# Patient Record
Sex: Female | Born: 1960 | Race: Black or African American | Hispanic: No | Smoking: Never smoker
Health system: Southern US, Community
[De-identification: ages and names within clinical notes are randomized; demographics above are authoritative.]

## PROBLEM LIST (undated history)

## (undated) DIAGNOSIS — M1990 Unspecified osteoarthritis, unspecified site: Secondary | ICD-10-CM

## (undated) DIAGNOSIS — K219 Gastro-esophageal reflux disease without esophagitis: Secondary | ICD-10-CM

## (undated) DIAGNOSIS — M1711 Unilateral primary osteoarthritis, right knee: Secondary | ICD-10-CM

## (undated) DIAGNOSIS — M2550 Pain in unspecified joint: Secondary | ICD-10-CM

## (undated) DIAGNOSIS — R600 Localized edema: Secondary | ICD-10-CM

## (undated) DIAGNOSIS — E039 Hypothyroidism, unspecified: Secondary | ICD-10-CM

## (undated) DIAGNOSIS — G8929 Other chronic pain: Secondary | ICD-10-CM

## (undated) DIAGNOSIS — D649 Anemia, unspecified: Secondary | ICD-10-CM

## (undated) DIAGNOSIS — R51 Headache: Secondary | ICD-10-CM

## (undated) DIAGNOSIS — M549 Dorsalgia, unspecified: Secondary | ICD-10-CM

## (undated) DIAGNOSIS — M2540 Effusion, unspecified joint: Secondary | ICD-10-CM

## (undated) DIAGNOSIS — R351 Nocturia: Secondary | ICD-10-CM

## (undated) DIAGNOSIS — R7303 Prediabetes: Secondary | ICD-10-CM

## (undated) DIAGNOSIS — R55 Syncope and collapse: Secondary | ICD-10-CM

## (undated) DIAGNOSIS — R609 Edema, unspecified: Secondary | ICD-10-CM

## (undated) DIAGNOSIS — F329 Major depressive disorder, single episode, unspecified: Secondary | ICD-10-CM

## (undated) DIAGNOSIS — F419 Anxiety disorder, unspecified: Secondary | ICD-10-CM

## (undated) DIAGNOSIS — T7840XA Allergy, unspecified, initial encounter: Secondary | ICD-10-CM

## (undated) DIAGNOSIS — M069 Rheumatoid arthritis, unspecified: Secondary | ICD-10-CM

## (undated) HISTORY — DX: Unspecified osteoarthritis, unspecified site: M19.90

## (undated) HISTORY — DX: Anxiety disorder, unspecified: F41.9

## (undated) HISTORY — PX: EYE SURGERY: SHX253

## (undated) HISTORY — DX: Major depressive disorder, single episode, unspecified: F32.9

## (undated) HISTORY — PX: TONSILLECTOMY: SUR1361

## (undated) HISTORY — DX: Rheumatoid arthritis, unspecified: M06.9

## (undated) HISTORY — PX: TUBAL LIGATION: SHX77

## (undated) HISTORY — DX: Syncope and collapse: R55

## (undated) HISTORY — PX: CHOLECYSTECTOMY: SHX55

## (undated) HISTORY — DX: Allergy, unspecified, initial encounter: T78.40XA

## (undated) HISTORY — DX: Gastro-esophageal reflux disease without esophagitis: K21.9

---

## 1989-03-18 HISTORY — PX: CARPAL TUNNEL RELEASE: SHX101

## 1993-03-18 HISTORY — PX: ABDOMINAL HYSTERECTOMY: SHX81

## 1993-03-18 HISTORY — PX: KNEE ARTHROSCOPY: SUR90

## 1998-01-08 ENCOUNTER — Emergency Department (HOSPITAL_COMMUNITY): Admission: EM | Admit: 1998-01-08 | Discharge: 1998-01-08 | Payer: Self-pay | Admitting: Emergency Medicine

## 1998-01-08 ENCOUNTER — Encounter: Payer: Self-pay | Admitting: Emergency Medicine

## 1998-08-28 ENCOUNTER — Ambulatory Visit (HOSPITAL_COMMUNITY): Admission: RE | Admit: 1998-08-28 | Discharge: 1998-08-28 | Payer: Self-pay | Admitting: Obstetrics and Gynecology

## 1998-11-15 ENCOUNTER — Encounter (INDEPENDENT_AMBULATORY_CARE_PROVIDER_SITE_OTHER): Payer: Self-pay

## 1998-11-15 ENCOUNTER — Inpatient Hospital Stay (HOSPITAL_COMMUNITY): Admission: RE | Admit: 1998-11-15 | Discharge: 1998-11-17 | Payer: Self-pay | Admitting: Obstetrics and Gynecology

## 1999-03-19 DIAGNOSIS — F32A Depression, unspecified: Secondary | ICD-10-CM

## 1999-03-19 HISTORY — DX: Depression, unspecified: F32.A

## 1999-10-04 ENCOUNTER — Inpatient Hospital Stay (HOSPITAL_COMMUNITY): Admission: EM | Admit: 1999-10-04 | Discharge: 1999-10-08 | Payer: Self-pay | Admitting: *Deleted

## 2000-08-27 ENCOUNTER — Other Ambulatory Visit: Admission: RE | Admit: 2000-08-27 | Discharge: 2000-08-27 | Payer: Self-pay | Admitting: Obstetrics and Gynecology

## 2001-12-21 ENCOUNTER — Encounter: Payer: Self-pay | Admitting: Obstetrics and Gynecology

## 2001-12-21 ENCOUNTER — Ambulatory Visit (HOSPITAL_COMMUNITY): Admission: RE | Admit: 2001-12-21 | Discharge: 2001-12-21 | Payer: Self-pay | Admitting: Obstetrics and Gynecology

## 2002-03-18 HISTORY — PX: THYROIDECTOMY, PARTIAL: SHX18

## 2002-03-27 ENCOUNTER — Emergency Department (HOSPITAL_COMMUNITY): Admission: EM | Admit: 2002-03-27 | Discharge: 2002-03-28 | Payer: Self-pay | Admitting: Emergency Medicine

## 2002-08-27 ENCOUNTER — Encounter: Payer: Self-pay | Admitting: Emergency Medicine

## 2002-08-27 ENCOUNTER — Inpatient Hospital Stay (HOSPITAL_COMMUNITY): Admission: EM | Admit: 2002-08-27 | Discharge: 2002-08-28 | Payer: Self-pay | Admitting: Emergency Medicine

## 2002-08-28 ENCOUNTER — Encounter: Payer: Self-pay | Admitting: Family Medicine

## 2002-09-13 ENCOUNTER — Ambulatory Visit (HOSPITAL_COMMUNITY): Admission: RE | Admit: 2002-09-13 | Discharge: 2002-09-13 | Payer: Self-pay | Admitting: Family Medicine

## 2002-09-13 ENCOUNTER — Encounter: Payer: Self-pay | Admitting: Family Medicine

## 2002-09-27 ENCOUNTER — Encounter: Admission: RE | Admit: 2002-09-27 | Discharge: 2002-09-27 | Payer: Self-pay | Admitting: Family Medicine

## 2002-10-14 ENCOUNTER — Encounter: Payer: Self-pay | Admitting: General Surgery

## 2002-10-18 ENCOUNTER — Encounter: Admission: RE | Admit: 2002-10-18 | Discharge: 2002-10-18 | Payer: Self-pay | Admitting: Neurology

## 2002-10-18 ENCOUNTER — Encounter: Payer: Self-pay | Admitting: Neurology

## 2002-10-19 ENCOUNTER — Encounter (INDEPENDENT_AMBULATORY_CARE_PROVIDER_SITE_OTHER): Payer: Self-pay | Admitting: *Deleted

## 2002-10-19 ENCOUNTER — Ambulatory Visit (HOSPITAL_COMMUNITY): Admission: RE | Admit: 2002-10-19 | Discharge: 2002-10-20 | Payer: Self-pay | Admitting: General Surgery

## 2002-12-23 ENCOUNTER — Encounter: Payer: Self-pay | Admitting: Emergency Medicine

## 2002-12-23 ENCOUNTER — Inpatient Hospital Stay (HOSPITAL_COMMUNITY): Admission: EM | Admit: 2002-12-23 | Discharge: 2002-12-27 | Payer: Self-pay | Admitting: Emergency Medicine

## 2002-12-25 ENCOUNTER — Encounter: Payer: Self-pay | Admitting: Neurology

## 2002-12-25 ENCOUNTER — Encounter: Admission: RE | Admit: 2002-12-25 | Discharge: 2002-12-25 | Payer: Self-pay | Admitting: Neurology

## 2003-03-15 ENCOUNTER — Other Ambulatory Visit: Admission: RE | Admit: 2003-03-15 | Discharge: 2003-03-15 | Payer: Self-pay | Admitting: Obstetrics and Gynecology

## 2004-05-09 ENCOUNTER — Ambulatory Visit: Payer: Self-pay | Admitting: Family Medicine

## 2004-05-11 ENCOUNTER — Ambulatory Visit (HOSPITAL_COMMUNITY): Admission: RE | Admit: 2004-05-11 | Discharge: 2004-05-11 | Payer: Self-pay | Admitting: Family Medicine

## 2004-08-21 ENCOUNTER — Ambulatory Visit: Payer: Self-pay | Admitting: Family Medicine

## 2004-08-23 ENCOUNTER — Ambulatory Visit (HOSPITAL_COMMUNITY): Admission: RE | Admit: 2004-08-23 | Discharge: 2004-08-23 | Payer: Self-pay | Admitting: Family Medicine

## 2004-09-25 ENCOUNTER — Ambulatory Visit: Payer: Self-pay | Admitting: Family Medicine

## 2004-10-04 ENCOUNTER — Ambulatory Visit: Payer: Self-pay | Admitting: Family Medicine

## 2004-12-07 ENCOUNTER — Ambulatory Visit: Payer: Self-pay | Admitting: Family Medicine

## 2005-03-18 HISTORY — PX: CHOLECYSTECTOMY, LAPAROSCOPIC: SHX56

## 2005-03-18 HISTORY — PX: CRANIECTOMY SUBOCCIPITAL W/ CERVICAL LAMINECTOMY / CHIARI: SUR327

## 2005-04-11 ENCOUNTER — Ambulatory Visit: Payer: Self-pay | Admitting: Family Medicine

## 2005-04-23 ENCOUNTER — Ambulatory Visit: Payer: Self-pay | Admitting: Family Medicine

## 2005-05-09 ENCOUNTER — Ambulatory Visit: Payer: Self-pay | Admitting: Family Medicine

## 2005-05-10 ENCOUNTER — Ambulatory Visit (HOSPITAL_COMMUNITY): Admission: RE | Admit: 2005-05-10 | Discharge: 2005-05-10 | Payer: Self-pay | Admitting: Family Medicine

## 2005-05-13 ENCOUNTER — Ambulatory Visit: Payer: Self-pay | Admitting: Psychology

## 2005-06-07 ENCOUNTER — Ambulatory Visit (HOSPITAL_COMMUNITY): Payer: Self-pay | Admitting: Psychology

## 2005-08-28 ENCOUNTER — Ambulatory Visit: Payer: Self-pay | Admitting: Family Medicine

## 2005-10-23 ENCOUNTER — Other Ambulatory Visit: Admission: RE | Admit: 2005-10-23 | Discharge: 2005-10-23 | Payer: Self-pay | Admitting: Family Medicine

## 2005-10-23 ENCOUNTER — Ambulatory Visit: Payer: Self-pay | Admitting: Family Medicine

## 2005-10-31 ENCOUNTER — Ambulatory Visit (HOSPITAL_COMMUNITY): Admission: RE | Admit: 2005-10-31 | Discharge: 2005-10-31 | Payer: Self-pay | Admitting: Family Medicine

## 2005-10-31 ENCOUNTER — Ambulatory Visit: Payer: Self-pay | Admitting: Family Medicine

## 2005-12-26 ENCOUNTER — Emergency Department (HOSPITAL_COMMUNITY): Admission: EM | Admit: 2005-12-26 | Discharge: 2005-12-26 | Payer: Self-pay | Admitting: Emergency Medicine

## 2005-12-26 ENCOUNTER — Ambulatory Visit: Payer: Self-pay | Admitting: Family Medicine

## 2005-12-27 ENCOUNTER — Ambulatory Visit (HOSPITAL_COMMUNITY): Admission: RE | Admit: 2005-12-27 | Discharge: 2005-12-27 | Payer: Self-pay | Admitting: Family Medicine

## 2005-12-31 ENCOUNTER — Encounter (HOSPITAL_COMMUNITY): Admission: RE | Admit: 2005-12-31 | Discharge: 2006-01-30 | Payer: Self-pay | Admitting: Family Medicine

## 2006-01-10 ENCOUNTER — Encounter (INDEPENDENT_AMBULATORY_CARE_PROVIDER_SITE_OTHER): Payer: Self-pay | Admitting: *Deleted

## 2006-01-10 ENCOUNTER — Ambulatory Visit (HOSPITAL_COMMUNITY): Admission: RE | Admit: 2006-01-10 | Discharge: 2006-01-10 | Payer: Self-pay | Admitting: General Surgery

## 2006-02-24 ENCOUNTER — Ambulatory Visit: Payer: Self-pay | Admitting: Family Medicine

## 2006-06-23 ENCOUNTER — Ambulatory Visit: Payer: Self-pay | Admitting: Family Medicine

## 2006-08-25 ENCOUNTER — Ambulatory Visit: Payer: Self-pay | Admitting: Family Medicine

## 2006-08-26 ENCOUNTER — Ambulatory Visit (HOSPITAL_COMMUNITY): Admission: RE | Admit: 2006-08-26 | Discharge: 2006-08-26 | Payer: Self-pay | Admitting: Family Medicine

## 2006-09-17 ENCOUNTER — Ambulatory Visit: Payer: Self-pay | Admitting: Family Medicine

## 2006-10-06 ENCOUNTER — Ambulatory Visit: Payer: Self-pay | Admitting: Family Medicine

## 2006-10-07 ENCOUNTER — Encounter: Payer: Self-pay | Admitting: Family Medicine

## 2006-10-07 LAB — CONVERTED CEMR LAB
Basophils Absolute: 0 10*3/uL (ref 0.0–0.1)
CO2: 26 meq/L (ref 19–32)
Calcium: 8.7 mg/dL (ref 8.4–10.5)
Eosinophils Relative: 2 % (ref 0–5)
HCT: 43.4 % (ref 36.0–46.0)
HDL: 47 mg/dL (ref >39)
Lymphocytes Relative: 28 % (ref 12–46)
Neutro Abs: 4.8 10*3/uL (ref 1.7–7.7)
Neutrophils Relative %: 63 % (ref 43–77)
Platelets: 391 10*3/uL (ref 150–400)
Potassium: 4.6 meq/L (ref 3.5–5.3)
RDW: 16 % — ABNORMAL HIGH (ref 11.5–14.0)
Sodium: 138 meq/L (ref 135–145)
TSH: 4.757 microintl units/mL (ref 0.350–5.50)
Total CHOL/HDL Ratio: 3

## 2006-10-15 ENCOUNTER — Ambulatory Visit: Payer: Self-pay | Admitting: Family Medicine

## 2006-10-20 ENCOUNTER — Ambulatory Visit (HOSPITAL_COMMUNITY): Admission: RE | Admit: 2006-10-20 | Discharge: 2006-10-20 | Payer: Self-pay | Admitting: *Deleted

## 2006-11-25 ENCOUNTER — Ambulatory Visit: Payer: Self-pay | Admitting: Family Medicine

## 2006-12-04 ENCOUNTER — Ambulatory Visit (HOSPITAL_COMMUNITY): Admission: RE | Admit: 2006-12-04 | Discharge: 2006-12-04 | Payer: Self-pay | Admitting: *Deleted

## 2006-12-22 ENCOUNTER — Ambulatory Visit (HOSPITAL_COMMUNITY): Admission: RE | Admit: 2006-12-22 | Discharge: 2006-12-23 | Payer: Self-pay | Admitting: Neurosurgery

## 2007-02-23 ENCOUNTER — Ambulatory Visit: Payer: Self-pay | Admitting: Family Medicine

## 2007-03-19 ENCOUNTER — Encounter: Payer: Self-pay | Admitting: Family Medicine

## 2007-04-09 ENCOUNTER — Ambulatory Visit: Payer: Self-pay | Admitting: Family Medicine

## 2007-08-25 ENCOUNTER — Encounter: Payer: Self-pay | Admitting: Family Medicine

## 2007-09-02 ENCOUNTER — Ambulatory Visit: Payer: Self-pay | Admitting: Family Medicine

## 2007-09-15 ENCOUNTER — Ambulatory Visit (HOSPITAL_COMMUNITY): Admission: RE | Admit: 2007-09-15 | Discharge: 2007-09-15 | Payer: Self-pay | Admitting: Family Medicine

## 2007-10-07 ENCOUNTER — Ambulatory Visit: Payer: Self-pay | Admitting: Family Medicine

## 2007-11-26 ENCOUNTER — Telehealth: Payer: Self-pay | Admitting: Family Medicine

## 2007-11-27 ENCOUNTER — Encounter: Payer: Self-pay | Admitting: Family Medicine

## 2007-11-30 ENCOUNTER — Ambulatory Visit: Payer: Self-pay | Admitting: Family Medicine

## 2007-12-22 ENCOUNTER — Ambulatory Visit: Payer: Self-pay | Admitting: Family Medicine

## 2007-12-22 ENCOUNTER — Ambulatory Visit (HOSPITAL_COMMUNITY): Admission: RE | Admit: 2007-12-22 | Discharge: 2007-12-22 | Payer: Self-pay | Admitting: Family Medicine

## 2007-12-29 ENCOUNTER — Ambulatory Visit (HOSPITAL_COMMUNITY): Admission: RE | Admit: 2007-12-29 | Discharge: 2007-12-29 | Payer: Self-pay | Admitting: Family Medicine

## 2008-03-21 ENCOUNTER — Telehealth: Payer: Self-pay | Admitting: Family Medicine

## 2008-03-21 ENCOUNTER — Ambulatory Visit: Payer: Self-pay | Admitting: Family Medicine

## 2008-03-21 LAB — CONVERTED CEMR LAB
BUN: 18 mg/dL (ref 6–23)
Bilirubin Urine: NEGATIVE
Calcium: 9.2 mg/dL (ref 8.4–10.5)
Cholesterol: 156 mg/dL (ref 0–200)
Eosinophils Absolute: 0.2 10*3/uL (ref 0.0–0.7)
Eosinophils Relative: 2 % (ref 0–5)
Glucose, Bld: 89 mg/dL (ref 70–99)
Glucose, Urine, Semiquant: NEGATIVE
HCT: 42.9 % (ref 36.0–46.0)
Ketones, urine, test strip: NEGATIVE
Lymphs Abs: 3.1 10*3/uL (ref 0.7–4.0)
MCV: 79.2 fL (ref 78.0–100.0)
Monocytes Relative: 7 % (ref 3–12)
Nitrite: NEGATIVE
RBC: 5.42 M/uL — ABNORMAL HIGH (ref 3.87–5.11)
Specific Gravity, Urine: 1.02
Urobilinogen, UA: 0.2
VLDL: 10 mg/dL (ref 0–40)
WBC: 7.4 10*3/uL (ref 4.0–10.5)
pH: 5.5

## 2008-03-22 ENCOUNTER — Ambulatory Visit (HOSPITAL_COMMUNITY): Admission: RE | Admit: 2008-03-22 | Discharge: 2008-03-22 | Payer: Self-pay | Admitting: Family Medicine

## 2008-03-22 ENCOUNTER — Encounter: Payer: Self-pay | Admitting: Family Medicine

## 2008-05-17 ENCOUNTER — Inpatient Hospital Stay (HOSPITAL_COMMUNITY): Admission: RE | Admit: 2008-05-17 | Discharge: 2008-05-20 | Payer: Self-pay | Admitting: *Deleted

## 2008-05-17 ENCOUNTER — Emergency Department (HOSPITAL_COMMUNITY): Admission: EM | Admit: 2008-05-17 | Discharge: 2008-05-17 | Payer: Self-pay | Admitting: Emergency Medicine

## 2008-05-17 ENCOUNTER — Ambulatory Visit: Payer: Self-pay | Admitting: *Deleted

## 2008-05-17 ENCOUNTER — Ambulatory Visit: Payer: Self-pay | Admitting: Family Medicine

## 2008-05-23 ENCOUNTER — Encounter: Payer: Self-pay | Admitting: Family Medicine

## 2008-05-24 ENCOUNTER — Other Ambulatory Visit: Admission: RE | Admit: 2008-05-24 | Discharge: 2008-05-24 | Payer: Self-pay | Admitting: Family Medicine

## 2008-05-24 ENCOUNTER — Encounter: Payer: Self-pay | Admitting: Family Medicine

## 2008-05-24 ENCOUNTER — Ambulatory Visit: Payer: Self-pay | Admitting: Family Medicine

## 2008-06-01 ENCOUNTER — Ambulatory Visit (HOSPITAL_COMMUNITY): Payer: Self-pay | Admitting: Psychiatry

## 2008-06-10 ENCOUNTER — Ambulatory Visit (HOSPITAL_COMMUNITY): Payer: Self-pay | Admitting: Psychiatry

## 2008-07-25 ENCOUNTER — Ambulatory Visit (HOSPITAL_COMMUNITY): Payer: Self-pay | Admitting: Psychiatry

## 2008-08-01 ENCOUNTER — Ambulatory Visit: Payer: Self-pay | Admitting: Family Medicine

## 2008-08-03 ENCOUNTER — Ambulatory Visit: Payer: Self-pay | Admitting: Family Medicine

## 2008-08-03 LAB — CONVERTED CEMR LAB: Rapid Strep: NEGATIVE

## 2008-08-29 ENCOUNTER — Ambulatory Visit: Payer: Self-pay | Admitting: Cardiology

## 2008-08-30 ENCOUNTER — Encounter: Payer: Self-pay | Admitting: Family Medicine

## 2008-09-29 ENCOUNTER — Ambulatory Visit: Payer: Self-pay | Admitting: Family Medicine

## 2008-09-29 DIAGNOSIS — IMO0002 Reserved for concepts with insufficient information to code with codable children: Secondary | ICD-10-CM | POA: Insufficient documentation

## 2008-10-11 ENCOUNTER — Ambulatory Visit: Payer: Self-pay | Admitting: Family Medicine

## 2008-10-11 ENCOUNTER — Ambulatory Visit (HOSPITAL_COMMUNITY): Admission: RE | Admit: 2008-10-11 | Discharge: 2008-10-11 | Payer: Self-pay | Admitting: Family Medicine

## 2008-11-10 ENCOUNTER — Ambulatory Visit: Payer: Self-pay | Admitting: Family Medicine

## 2008-11-10 ENCOUNTER — Encounter (INDEPENDENT_AMBULATORY_CARE_PROVIDER_SITE_OTHER): Payer: Self-pay

## 2008-11-10 DIAGNOSIS — M25559 Pain in unspecified hip: Secondary | ICD-10-CM | POA: Insufficient documentation

## 2008-11-24 ENCOUNTER — Telehealth: Payer: Self-pay | Admitting: Family Medicine

## 2008-11-27 ENCOUNTER — Telehealth: Payer: Self-pay | Admitting: Family Medicine

## 2008-11-30 ENCOUNTER — Encounter: Payer: Self-pay | Admitting: Family Medicine

## 2008-12-07 ENCOUNTER — Ambulatory Visit: Payer: Self-pay | Admitting: Family Medicine

## 2009-01-05 ENCOUNTER — Ambulatory Visit: Payer: Self-pay | Admitting: Family Medicine

## 2009-01-05 DIAGNOSIS — R5383 Other fatigue: Secondary | ICD-10-CM

## 2009-01-05 DIAGNOSIS — R5381 Other malaise: Secondary | ICD-10-CM | POA: Insufficient documentation

## 2009-02-27 ENCOUNTER — Ambulatory Visit: Payer: Self-pay | Admitting: Family Medicine

## 2009-02-27 LAB — CONVERTED CEMR LAB
Glucose, Urine, Semiquant: NEGATIVE
Ketones, urine, test strip: NEGATIVE
pH: 5.5

## 2009-03-01 ENCOUNTER — Encounter: Payer: Self-pay | Admitting: Family Medicine

## 2009-03-03 LAB — CONVERTED CEMR LAB: Candida species: NEGATIVE

## 2009-03-18 HISTORY — PX: JOINT REPLACEMENT: SHX530

## 2009-05-11 ENCOUNTER — Ambulatory Visit: Payer: Self-pay | Admitting: Family Medicine

## 2009-06-01 ENCOUNTER — Encounter: Admission: RE | Admit: 2009-06-01 | Discharge: 2009-06-01 | Payer: Self-pay | Admitting: Neurosurgery

## 2009-06-09 ENCOUNTER — Emergency Department (HOSPITAL_COMMUNITY): Admission: EM | Admit: 2009-06-09 | Discharge: 2009-06-10 | Payer: Self-pay | Admitting: Occupational Medicine

## 2009-06-12 ENCOUNTER — Ambulatory Visit: Payer: Self-pay | Admitting: Family Medicine

## 2009-06-29 ENCOUNTER — Telehealth: Payer: Self-pay | Admitting: Family Medicine

## 2009-07-12 ENCOUNTER — Ambulatory Visit: Payer: Self-pay | Admitting: Family Medicine

## 2009-07-16 HISTORY — PX: TOTAL KNEE ARTHROPLASTY: SHX125

## 2009-07-25 ENCOUNTER — Encounter: Payer: Self-pay | Admitting: Physician Assistant

## 2009-07-25 ENCOUNTER — Ambulatory Visit: Payer: Self-pay | Admitting: Family Medicine

## 2009-07-27 ENCOUNTER — Encounter: Payer: Self-pay | Admitting: Family Medicine

## 2009-08-02 ENCOUNTER — Encounter: Payer: Self-pay | Admitting: Family Medicine

## 2009-08-03 ENCOUNTER — Ambulatory Visit: Payer: Self-pay | Admitting: Family Medicine

## 2009-08-04 ENCOUNTER — Encounter: Payer: Self-pay | Admitting: Family Medicine

## 2009-08-08 ENCOUNTER — Inpatient Hospital Stay (HOSPITAL_COMMUNITY): Admission: RE | Admit: 2009-08-08 | Discharge: 2009-08-11 | Payer: Self-pay | Admitting: Orthopedic Surgery

## 2009-08-23 ENCOUNTER — Encounter: Payer: Self-pay | Admitting: Family Medicine

## 2009-09-20 ENCOUNTER — Encounter: Payer: Self-pay | Admitting: Family Medicine

## 2009-09-27 ENCOUNTER — Encounter: Admission: RE | Admit: 2009-09-27 | Discharge: 2009-12-14 | Payer: Self-pay | Admitting: Orthopedic Surgery

## 2009-09-28 ENCOUNTER — Encounter: Payer: Self-pay | Admitting: Family Medicine

## 2009-10-09 ENCOUNTER — Ambulatory Visit: Payer: Self-pay | Admitting: Family Medicine

## 2009-10-11 ENCOUNTER — Encounter: Payer: Self-pay | Admitting: Family Medicine

## 2009-10-11 LAB — CONVERTED CEMR LAB: Chlamydia, DNA Probe: NEGATIVE

## 2009-10-18 ENCOUNTER — Encounter: Payer: Self-pay | Admitting: Family Medicine

## 2009-10-24 ENCOUNTER — Ambulatory Visit (HOSPITAL_COMMUNITY): Admission: RE | Admit: 2009-10-24 | Discharge: 2009-10-24 | Payer: Self-pay | Admitting: Family Medicine

## 2009-12-06 ENCOUNTER — Encounter: Payer: Self-pay | Admitting: Family Medicine

## 2009-12-26 ENCOUNTER — Ambulatory Visit (HOSPITAL_COMMUNITY): Payer: Self-pay | Admitting: Psychiatry

## 2010-01-03 ENCOUNTER — Ambulatory Visit (HOSPITAL_COMMUNITY): Payer: Self-pay | Admitting: Psychiatry

## 2010-01-09 ENCOUNTER — Ambulatory Visit (HOSPITAL_COMMUNITY): Payer: Self-pay | Admitting: Psychiatry

## 2010-01-09 ENCOUNTER — Ambulatory Visit: Payer: Self-pay | Admitting: Family Medicine

## 2010-01-10 LAB — CONVERTED CEMR LAB
Hgb A1c MFr Bld: 6.3 % — ABNORMAL HIGH (ref <5.7)
TSH: 4.476 microintl units/mL (ref 0.350–4.500)

## 2010-01-12 ENCOUNTER — Ambulatory Visit (HOSPITAL_COMMUNITY): Payer: Self-pay | Admitting: Psychiatry

## 2010-01-16 ENCOUNTER — Ambulatory Visit (HOSPITAL_COMMUNITY): Payer: Self-pay | Admitting: Psychiatry

## 2010-01-17 ENCOUNTER — Encounter: Payer: Self-pay | Admitting: Family Medicine

## 2010-01-26 ENCOUNTER — Ambulatory Visit (HOSPITAL_COMMUNITY): Payer: Self-pay | Admitting: Psychiatry

## 2010-01-28 ENCOUNTER — Encounter: Payer: Self-pay | Admitting: Family Medicine

## 2010-02-05 ENCOUNTER — Ambulatory Visit (HOSPITAL_COMMUNITY): Payer: Self-pay | Admitting: Psychiatry

## 2010-02-13 ENCOUNTER — Ambulatory Visit (HOSPITAL_COMMUNITY): Payer: Self-pay | Admitting: Psychiatry

## 2010-02-22 ENCOUNTER — Ambulatory Visit (HOSPITAL_COMMUNITY): Payer: Self-pay | Admitting: Psychiatry

## 2010-02-23 ENCOUNTER — Ambulatory Visit (HOSPITAL_COMMUNITY): Payer: Self-pay | Admitting: Psychiatry

## 2010-02-23 ENCOUNTER — Encounter: Payer: Self-pay | Admitting: Family Medicine

## 2010-02-28 ENCOUNTER — Encounter: Payer: Self-pay | Admitting: Family Medicine

## 2010-03-02 ENCOUNTER — Ambulatory Visit (HOSPITAL_COMMUNITY): Payer: Self-pay | Admitting: Psychiatry

## 2010-03-02 ENCOUNTER — Encounter: Payer: Self-pay | Admitting: Family Medicine

## 2010-03-08 ENCOUNTER — Ambulatory Visit (HOSPITAL_COMMUNITY): Payer: Self-pay | Admitting: Psychiatry

## 2010-03-21 ENCOUNTER — Ambulatory Visit (HOSPITAL_COMMUNITY)
Admission: RE | Admit: 2010-03-21 | Discharge: 2010-03-21 | Payer: Self-pay | Source: Home / Self Care | Attending: Psychiatry | Admitting: Psychiatry

## 2010-03-29 ENCOUNTER — Encounter: Payer: Self-pay | Admitting: Family Medicine

## 2010-04-04 ENCOUNTER — Ambulatory Visit (HOSPITAL_COMMUNITY)
Admission: RE | Admit: 2010-04-04 | Discharge: 2010-04-04 | Payer: Self-pay | Source: Home / Self Care | Attending: Psychiatry | Admitting: Psychiatry

## 2010-04-08 ENCOUNTER — Encounter: Payer: Self-pay | Admitting: Family Medicine

## 2010-04-11 ENCOUNTER — Ambulatory Visit (HOSPITAL_COMMUNITY)
Admission: RE | Admit: 2010-04-11 | Discharge: 2010-04-11 | Payer: Self-pay | Source: Home / Self Care | Attending: Psychiatry | Admitting: Psychiatry

## 2010-04-17 NOTE — Letter (Signed)
Summary: labs  labs   Imported By: Curtis Sites 10/05/2009 10:34:47  _____________________________________________________________________  External Attachment:    Type:   Image     Comment:   External Document

## 2010-04-17 NOTE — Letter (Signed)
Summary: history and physical  history and physical   Imported By: Curtis Sites 10/05/2009 10:34:28  _____________________________________________________________________  External Attachment:    Type:   Image     Comment:   External Document

## 2010-04-17 NOTE — Letter (Signed)
Summary: progress notes  progress notes   Imported By: Curtis Sites 10/05/2009 10:35:45  _____________________________________________________________________  External Attachment:    Type:   Image     Comment:   External Document

## 2010-04-17 NOTE — Letter (Signed)
Summary: phone note  phone note   Imported By: Curtis Sites 10/05/2009 10:35:27  _____________________________________________________________________  External Attachment:    Type:   Image     Comment:   External Document

## 2010-04-17 NOTE — Letter (Signed)
Summary: xray  xray   Imported By: Curtis Sites 10/05/2009 10:36:03  _____________________________________________________________________  External Attachment:    Type:   Image     Comment:   External Document

## 2010-04-17 NOTE — Letter (Signed)
Summary: demographic  demographic   Imported By: Curtis Sites 10/05/2009 10:34:06  _____________________________________________________________________  External Attachment:    Type:   Image     Comment:   External Document

## 2010-04-17 NOTE — Letter (Signed)
Summary: consult  consult   Imported By: Curtis Sites 10/05/2009 10:33:44  _____________________________________________________________________  External Attachment:    Type:   Image     Comment:   External Document

## 2010-04-17 NOTE — Assessment & Plan Note (Signed)
Summary: [employer] papers   History of Present Illness: FMLA Forms completed.

## 2010-04-17 NOTE — Letter (Signed)
Summary: misc  misc   Imported By: Curtis Sites 10/05/2009 10:35:06  _____________________________________________________________________  External Attachment:    Type:   Image     Comment:   External Document

## 2010-04-18 ENCOUNTER — Encounter: Payer: Self-pay | Admitting: Family Medicine

## 2010-04-18 ENCOUNTER — Ambulatory Visit: Payer: Medicaid Other | Admitting: Family Medicine

## 2010-04-18 ENCOUNTER — Ambulatory Visit: Admit: 2010-04-18 | Payer: Self-pay | Admitting: Family Medicine

## 2010-04-18 ENCOUNTER — Encounter (HOSPITAL_COMMUNITY): Payer: Medicaid Other | Admitting: Psychiatry

## 2010-04-18 DIAGNOSIS — F329 Major depressive disorder, single episode, unspecified: Secondary | ICD-10-CM

## 2010-04-18 DIAGNOSIS — M25569 Pain in unspecified knee: Secondary | ICD-10-CM

## 2010-04-19 ENCOUNTER — Encounter: Payer: Self-pay | Admitting: Family Medicine

## 2010-04-19 LAB — CONVERTED CEMR LAB
Albumin: 4 g/dL (ref 3.5–5.2)
Basophils Absolute: 0 10*3/uL (ref 0.0–0.1)
Bilirubin, Direct: 0.1 mg/dL (ref 0.0–0.3)
CO2: 28 meq/L (ref 19–32)
Chloride: 102 meq/L (ref 96–112)
Eosinophils Relative: 3 % (ref 0–5)
Glucose, Bld: 89 mg/dL (ref 70–99)
HCT: 40.5 % (ref 36.0–46.0)
HDL: 51 mg/dL (ref >39)
Hgb A1c MFr Bld: 6.2 % — ABNORMAL HIGH (ref <5.7)
LDL Cholesterol: 76 mg/dL (ref 0–99)
Lymphocytes Relative: 44 % (ref 12–46)
Lymphs Abs: 2.8 10*3/uL (ref 0.7–4.0)
Neutro Abs: 3 10*3/uL (ref 1.7–7.7)
Neutrophils Relative %: 47 % (ref 43–77)
Platelets: 398 10*3/uL (ref 150–400)
Potassium: 4.3 meq/L (ref 3.5–5.3)
Sodium: 138 meq/L (ref 135–145)
Total Bilirubin: 0.5 mg/dL (ref 0.3–1.2)
Total CHOL/HDL Ratio: 2.7
VLDL: 9 mg/dL (ref 0–40)
Vit D, 25-Hydroxy: 16 ng/mL — ABNORMAL LOW (ref 30–89)
WBC: 6.4 10*3/uL (ref 4.0–10.5)

## 2010-04-19 NOTE — Letter (Signed)
Summary: [name]/WAINER  [name]/WAINER   Imported By: Lind Guest 03/01/2010 14:44:11  _____________________________________________________________________  External Attachment:    Type:   Image     Comment:   External Document

## 2010-04-19 NOTE — Assessment & Plan Note (Signed)
Summary: office visit   Vital Signs:  Patient profile:   50 year old female Menstrual status:  hysterectomy Height:      62 inches Weight:      355.75 pounds BMI:     65.30 O2 Sat:      97 % on Room air Pulse rate:   69 / minute Pulse rhythm:   regular Resp:     16 per minute BP sitting:   124 / 84  (right arm)  Vitals Entered By: Mauricia Area CMA (January 09, 2010 1:45 PM)  Nutrition Counseling: Patient's BMI is greater than 25 and therefore counseled on weight management options.  O2 Flow:  Room air CC: Follow up   Primary Care Provider:  [name] MD  CC:  Follow up.  History of Present Illness: pt in today stating that sh is not doing well overall. She has had some success from her recent knwe surgery, however, she i still unable to stand for prolonged periods, hence not working. This is financially and mentally challenging, and she is grateful for the support of her family, without which she doubts she would manage. she has no other specific site sensitive complaints.   Current Medications (verified): 1)  Ibuprofen 800 Mg Tabs (Ibuprofen) .... Take 1 Tablet By Mouth Two Times A Day , Pls Fill After  March 25,2011 2)  Phentermine Hcl 37.5 Mg Tabs (Phentermine Hcl) .... Take 1 Tablet By Mouth Once A Day  Allergies (verified): No Known Drug Allergies  Past History:  Past surgical history reviewed for relevance to current acute and chronic problems.  Past Surgical History: Reviewed history from 10/09/2009 and no changes required. 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 Eyes:   Denies blurring, discharge, eye pain, and red eye. ENT:  Denies hoarseness, nasal congestion, and sinus pressure. CV:  Denies chest pain or discomfort, palpitations, and swelling of feet. Resp:  Denies cough and sputum productive. GI:  Denies abdominal pain, constipation, diarrhea, nausea, and vomiting. GU:  Denies dysuria and urinary frequency. MS:  Complains of joint pain, low back pain, mid back pain, and stiffness; improved post surgery, on knee, however unable to stand for prolonged periods , hence uinable to work. Derm:  Denies itching and rash. Neuro:  Complains of headaches; much improved since surgery for arnold chiari. Psych:  Complains of anxiety and depression; denies suicidal thoughts/plans, thoughts of violence, and unusual visions or sounds; CIRCUMSTANCES CAUSE HER TO BE SYMPTOMATIC. Endo:  Denies cold intolerance, excessive thirst, excessive urination, and heat intolerance. Heme:  Denies abnormal bruising and bleeding. Allergy:  Denies hives or rash and itching eyes.  Physical Exam  General:  Well-developed,morbbidly obese,in no acute distress; alert,appropriate and cooperative throughout examination HEENT: No facial asymmetry,  EOMI, No sinus tenderness, TM's Clear, oropharynx  pink and moist.   Chest: Clear to auscultation bilaterally.  CVS: S1, S2, No murmurs, No S3.   Abd: Soft, Nontender.  MS: decreased  ROM spine,  hips and and knees.  Ext: No edema.   CNS: CN 2-12 intact, power tone and sensation normal throughout.  Skin: Intact, no visible lesions or rashes.  Psych: Good eye contact, normal affect.  Memory intact, fishy smelling vaginal d/c    Impression & Recommendations:  Problem # 1:  DEPRESSION (ICD-311) Assessment Unchanged  Discussed treatment options, including trial of antidpressant medication. Will refer to behavioral health. Follow-up call in in 24-48 hours and recheck in 2 weeks, sooner as needed. Patient agrees to call if any worsening of  symptoms or thoughts of doing harm arise. Verified that the patient has no suicidal ideation at this time.  pt continues in therapy  Problem # 2:  MORBID OBESITY (ICD-278.01) Assessment: Unchanged  Orders: T- Hemoglobin A1C (04540-98119)  Ht: 62 (01/09/2010)   Wt: 355.75 (01/09/2010)   BMI: 65.30 (01/09/2010) therapeutic lifestyle change discussed and encouraged  Problem # 3:  KNEE PAIN, BILATERAL (ICD-719.46) Assessment: Unchanged  Her updated medication list for this problem includes:    Ibuprofen 800 Mg Tabs (Ibuprofen) .Marland Kitchen... Take 1 tablet by mouth two times a day , pls fill after  march 25,2011  Complete Medication List: 1)  Ibuprofen 800 Mg Tabs (Ibuprofen) .... Take 1 tablet by mouth two times a day , pls fill after  march 25,2011 2)  Phentermine Hcl 37.5 Mg Caps (Phentermine hcl) .... Take 1 tablet by mouth once a day  Other Orders: T-TSH ([id number])  Patient Instructions: 1)  CPE nad pap in 2 months. 2)  It is important that you exercise regularly at least 20 minutes 5 times a week. If you develop chest pain, have severe difficulty breathing, or feel very tired , stop exercising immediately and seek medical attention. 3)  You need to lose weight. Consider a lower calorie diet and regular exercise.  4)  TSH prior to visit, ICD-9: 5)  HbgA1C prior to visit, ICD-9:   today 6)  I really hope that you start feeling better soon  Prescriptions: PHENTERMINE HCL 37.5 MG CAPS (PHENTERMINE HCL) Take 1 tablet by mouth once a day  #30 x 1   Entered and Authorized by:   [name] MD   Signed by:   [name] MD on 01/09/2010   Method used:   Printed then faxed to ...       [facility] [phone]* (retail)       [address]       [place], [place]  [address]       Ph: [phone] or [phone]       Fax: [phone]   RxID:   [id number]    Orders Added: 1)  Est. Patient Level III [99213] 2)  T- Hemoglobin A1C  [83036-23375] 3)  T-TSH [18841-66063]

## 2010-04-19 NOTE — Letter (Signed)
Summary: mediocal release  mediocal release   Imported By: Lind Guest 03/02/2010 14:15:14  _____________________________________________________________________  External Attachment:    Type:   Image     Comment:   External Document

## 2010-04-19 NOTE — Assessment & Plan Note (Signed)
Summary: office visit   Vital Signs:  Patient profile:   50 year old female Menstrual status:  hysterectomy Height:      63 inches Weight:      329.75 pounds BMI:     58.62 O2 Sat:      99 % Pulse rate:   74 / minute Pulse rhythm:   regular Resp:     16 per minute BP sitting:   130 / 84  (left arm) Cuff size:   xl  Vitals Entered By: Everitt Amber LPN (May 11, 2009 10:58 AM)  Nutrition Counseling: Patient's BMI is greater than 25 and therefore counseled on weight management options. CC: Follow up chronic problems   Primary Care Provider:  [name] MD  CC:  Follow up chronic problems.  History of Present Illness: Sinus pressure and congestion, yellow drainage x 4 days, no feevr , chills or body aches, no cough or sputum.  Wants to resume the phentermine in the meantime as she waits on surgery.  Wants ibuprofen for her bilateral knee pain which has been worse in the past week, and she requests an injection fior this also  Left upper ext pain and numbness, trying to contact neurosurgery.This started to flare up in thepast 2 weeks   Current Medications (verified): 1)  Phentermine Hcl 37.5 Mg Tabs (Phentermine Hcl) .... Take 1 Tablet By Mouth Once A Day  Allergies (verified): No Known Drug Allergies  Review of Systems      See HPI Eyes:  Denies blurring and discharge. GI:  Denies abdominal pain, constipation, diarrhea, nausea, and vomiting blood. GU:  Denies dysuria and urinary frequency. Neuro:  Denies headaches, seizures, and sensation of room spinning. Psych:  Denies anxiety and depression. Endo:  Denies cold intolerance, excessive hunger, excessive thirst, excessive urination, heat intolerance, polyuria, and weight change. Heme:  Denies abnormal bruising and bleeding. Allergy:  Complains of seasonal allergies.  Physical Exam  General:  Well-developed,obese,in no acute distress; alert,appropriate and cooperative throughout examination HEENT: No facial  asymmetry,  EOMI,positive  sinus tenderness, TM's Clear, oropharynx  pink and moist.   Chest: Clear to auscultation bilaterally.  CVS: S1, S2, No murmurs, No S3.   Abd: Soft, Nontender.  MS: decreased  ROM spine, hips, shoulders and reduced in knees.  Ext: No edema.   CNS: CN 2-12 intact, power tone and sensation normal throughout.   Skin: Intact, no visible lesions or rashes.  Psych: Good eye contact, normal affect.  Memory intact, not anxious or depressed appearing.    Impression & Recommendations:  Problem # 1:  KNEE PAIN, BILATERAL (ICD-719.46) Assessment Deteriorated  The following medications were removed from the medication list:    Tylenol Arthritis Pain 650 Mg Cr-tabs (Acetaminophen) .Marland Kitchen... 2 tabs once daily Her updated medication list for this problem includes:    Ibuprofen 800 Mg Tabs (Ibuprofen) .Marland Kitchen... Take 1 tablet by mouth three times a day  for 1 week , then one twice daily as needed    Ibuprofen 800 Mg Tabs (Ibuprofen) .Marland Kitchen... Take 1 tablet by mouth two times a day , pls fill after  march 25,2011  Orders: Ketorolac-Toradol 15mg  [id number]) Admin of Therapeutic Inj  intramuscular or subcutaneous (69629)  Problem # 2:  MORBID OBESITY (ICD-278.01) Assessment: Deteriorated  Ht: 63 (05/11/2009)   Wt: 329.75 (05/11/2009)   BMI: 58.62 (05/11/2009)  Problem # 3:  HYPERTENSION (ICD-401.9) Assessment: Unchanged  Orders: T-Basic Metabolic Panel ([id number])  BP today: 130/84 Prior BP: 130/84 (02/27/2009)  Labs Reviewed:  K+: 4.9 (03/21/2008) Creat: : 0.64 (03/21/2008)   Chol: 156 (03/21/2008)   HDL: 52 (03/21/2008)   LDL: 94 (03/21/2008)   TG: 48 (03/21/2008)  Problem # 4:  DEPRESSION (ICD-311) Assessment: Improved  Complete Medication List: 1)  Phentermine Hcl 37.5 Mg Tabs (Phentermine hcl) .... Take 1 tablet by mouth once a day 2)  Septra Ds 800-160 Mg Tabs (Sulfamethoxazole-trimethoprim) .... Take 1 tablet by mouth two times a day 3)  Ibuprofen 800 Mg Tabs  (Ibuprofen) .... Take 1 tablet by mouth three times a day  for 1 week , then one twice daily as needed 4)  Ibuprofen 800 Mg Tabs (Ibuprofen) .... Take 1 tablet by mouth two times a day , pls fill after  march 25,2011  Other Orders: T-Lipid Profile ([id number]) T-TSH [phone]) T-CBC w/Diff [phone])  Patient Instructions: 1)  CPE in 2 months 2)  It is important that you exercise regularly at least 20 minutes 5 times a week. If you develop chest pain, have severe difficulty breathing, or feel very tired , stop exercising immediately and seek medical attention. 3)  You need to lose weight. Consider a lower calorie diet and regular exercise.  4)  BMP prior to visit, ICD-9: 5)  Lipid Panel prior to visit, ICD-9: 6)  TSH prior to visit, ICD-9:  fasting asap. 7)  Meds are sent in for sinus infection as well as weight loss and for your knees 8)  CBC w/ Diff prior to visit, ICD-9: Prescriptions: IBUPROFEN 800 MG TABS (IBUPROFEN) Take 1 tablet by mouth two times a day , pls fill after  March 25,2011  #60 x 4   Entered and Authorized by:   [name] MD   Signed by:   [name] MD on 05/11/2009   Method used:   Electronically to        [facility] [phone]* (retail)       [address]       [place], [place]  [address]       Ph: [phone] or [phone]       Fax: [phone]   RxID:   [id number] IBUPROFEN 800 MG TABS (IBUPROFEN) Take 1 tablet by mouth three times a day  for 1 week , then one twice daily as needed  #70 x 0   Entered and Authorized by:   [name] MD   Signed by:   [name] MD on 05/11/2009   Method used:   Electronically to        [facility] [phone]* (retail)       [address]       [place], [place]  [address]       Ph: [phone] or [phone]       Fax: [phone]   RxID:   [id number] SEPTRA DS 800-160 MG TABS (SULFAMETHOXAZOLE-TRIMETHOPRIM) Take 1  tablet by mouth two times a day  #20 x 0   Entered and Authorized by:   [name] MD   Signed by:   [name] MD on 05/11/2009   Method used:   Electronically to        [facility] [phone]* (retail)       [address]       [place], [place]  [address]  Ph: [phone] or [phone]       Fax: [phone]   RxID:   [id number] PHENTERMINE HCL 37.5 MG TABS (PHENTERMINE HCL) Take 1 tablet by mouth once a day  #30 x 1   Entered and Authorized by:   [name] MD   Signed by:   [name] MD on 05/11/2009   Method used:   Printed then faxed to ...       [facility] [phone]* (retail)       [address]       [place], [place]  [address]       Ph: [phone] or [phone]       Fax: [phone]   RxID:   [id number]    Medication Administration  Injection # 1:    Medication: Ketorolac-Toradol 15mg     Diagnosis: KNEE PAIN, BILATERAL (ICD-719.46)    Route: IM    Site: RUOQ gluteus    Exp Date: 10/2010    Lot #: [id number]    Mfr: novaplus    Comments: 60mg  given     Patient tolerated injection without complications    Given by: Everitt Amber LPN (May 11, 2009 12:52 PM)  Orders Added: 1)  Est. Patient Level IV [99214] 2)  T-Basic Metabolic Panel [80048-22910] 3)  T-Lipid Profile [80061-22930] 4)  T-TSH [55732-20254] 5)  T-CBC w/Diff [[id number]] 6)  Ketorolac-Toradol 15mg  [J1885] 7)  Admin of Therapeutic Inj  intramuscular or subcutaneous [31517]

## 2010-04-19 NOTE — Assessment & Plan Note (Signed)
Summary: follow hosiptal room 1   Vital Signs:  Patient profile:   50 year old female Menstrual status:  hysterectomy Height:      62 inches Weight:      331.08 pounds O2 Sat:      99 % on Room air Pulse rate:   69 / minute BP sitting:   136 / 76 CC: Here for a follow up on ER visit.  She states she still has trouble breathing sometime.  She states she is very congestive they gave her pretozone in the ER and she was suppose to get an inhaler, however she can't afford that right now.  She is also taken nabumetone 500mg .  Is Patient Diabetic? No   Primary Provider:  [name] MD  CC:  Here for a follow up on ER visit.  She states she still has trouble breathing sometime.  She states she is very congestive they gave her pretozone in the ER and she was suppose to get an inhaler and however she can't afford that right now.  She is also taken nabumetone 500mg . .  History of Present Illness: Pt was seen in the ER 06/09/09.  Dx: shortness of breath, unclear etiology. CXR was done & was neg.  Symtoms started last wk & had worsened by Fri. cough - prod, yellow phlegm shortness of breath & wheezing - has improved about 50% since at ER. Sinus congestion & post nasal drainage Also had alot of swelling in her ankles, hands & face last Wed.  She took one of her dads water pills.  Voided alot affterwards & swelling went down.  No chest pain.  Has not had any shortness of breath, or exertional dyspnea prior to last wk. Usually sleeps with 2 pillows, last 3 nights has needed 3.  ER prescription'd inhaler but pt was unable to afford.   Allergies: No Known Drug Allergies  Past History:  Past medical history reviewed for relevance to current acute and chronic problems.  Past Medical History: Reviewed history from 11/30/2007 and no changes required. Obesity migraine headaches h/o depression  Review of Systems General:  Denies chills and fever. ENT:  Complains of nasal congestion,  postnasal drainage, and sinus pressure; denies earache and sore throat. CV:  Denies chest pain or discomfort. Resp:  Complains of cough, shortness of breath, sputum productive, and wheezing.  Physical Exam  General:  Well-developed,well-nourished,in no acute distress; alert,appropriate and cooperative throughout examination Head:  Normocephalic and atraumatic without obvious abnormalities. No apparent alopecia or balding. Ears:  External ear exam shows no significant lesions or deformities.  Otoscopic examination reveals clear canals, tympanic membranes are intact bilaterally without bulging, retraction, inflammation or discharge. Hearing is grossly normal bilaterally. Nose:  no external deformity, no sinus percussion tenderness, mucosal erythema, and mucosal edema.   Mouth:  Oral mucosa and oropharynx without lesions or exudates.  Teeth in good repair. Neck:  No deformities, masses, or tenderness noted. Lungs:  Normal respiratory effort, chest expands symmetrically. Lungs are clear to auscultation, no crackles or wheezes. Heart:  Normal rate and regular rhythm. S1 and S2 normal without gallop, murmur, click, rub or other extra sounds. Extremities:  No PTE. Cervical Nodes:  No lymphadenopathy noted Psych:  Cognition and judgment appear intact. Alert and cooperative with normal attention span and concentration. No apparent delusions, illusions, hallucinations   Impression & Recommendations:  Problem # 1:  SINUSITIS, ACUTE (ICD-461.9) Assessment New *Offered off work note. Pt states she needs to work & wishes to  try.  Discussed that she could call back for a note for couple of days if she finds that she is unable to work.  The following medications were removed from the medication list:    Septra Ds 800-160 Mg Tabs (Sulfamethoxazole-trimethoprim) .Marland Kitchen... Take 1 tablet by mouth two times a day Her updated medication list for this problem includes:    Azithromycin 250 Mg Tabs (Azithromycin)  .Marland Kitchen... Take 2 today, then 1 daily x 4 days  Problem # 2:  ACUTE BRONCHITIS (ICD-466.0) Assessment: New CXR report from ER visit was reviewed. Encouraged pt to pick up albuterol prescribed by the ER.    The following medications were removed from the medication list:    Septra Ds 800-160 Mg Tabs (Sulfamethoxazole-trimethoprim) .Marland Kitchen... Take 1 tablet by mouth two times a day Her updated medication list for this problem includes:    Azithromycin 250 Mg Tabs (Azithromycin) .Marland Kitchen... Take 2 today, then 1 daily x 4 days  Complete Medication List: 1)  Phentermine Hcl 37.5 Mg Tabs (Phentermine hcl) .... Take 1 tablet by mouth once a day 2)  Ibuprofen 800 Mg Tabs (Ibuprofen) .... Take 1 tablet by mouth two times a day , pls fill after  march 25,2011 3)  Azithromycin 250 Mg Tabs (Azithromycin) .... Take 2 today, then 1 daily x 4 days  Patient Instructions: 1)  Follow up appt in one week if syptoms persist.  Sooner if worsens. 2)  It is important that you exercise regularly at least 20 minutes 5 times a week. If you develop chest pain, have severe difficulty breathing, or feel very tired , stop exercising immediately and seek medical attention. 3)  You need to lose weight. Consider a lower calorie diet and regular exercise.  4)  Get plenty of rest, drink lots of clear liquids, and use Tylenol or Ibuprofen for fever and comfort. Return in 7-10 days if you're not better:sooner if you're feeling worse. Prescriptions: AZITHROMYCIN 250 MG TABS (AZITHROMYCIN) take 2 today, then 1 daily x 4 days  #6 x 0   Entered and Authorized by:   Esperanza Sheets PA   Signed by:   Esperanza Sheets PA on 06/12/2009   Method used:   Electronically to        [facility] [phone]* (retail)       [address]       [place], [place]  [address]       Ph: [phone] or [phone]       Fax: [phone]   RxID:   [id number]

## 2010-04-19 NOTE — Assessment & Plan Note (Signed)
Summary: knee pain- room 1   Vital Signs:  Patient profile:   50 year old female Menstrual status:  hysterectomy Height:      62 inches Weight:      331.50 pounds BMI:     60.85 O2 Sat:      98 % on Room air Pulse rate:   73 / minute Resp:     16 per minute BP sitting:   120 / 78  (left arm)  Vitals Entered By: Adella Hare LPN (Jul 25, 2009 4:04 PM) CC: left knee pain Is Patient Diabetic? No Pain Assessment Patient in pain? yes     Location: left knee Intensity: 9 Type: sharp Onset of pain  Constant Comments didnt bring meds to ov   Primary Provider:  [name] MD  CC:  left knee pain.  History of Present Illness: Pt sates her left knee gave out on her yesterday & she fell.  She has increased pain since her fall.  She states that she has seen an orthopod in the past.  Needs to have surgery, "bone on bone." Pt thinks she hit the front of her knee on the floor when she feel.  She has to stand at work and with her knee being more painful she is unable to do this at this time.  She has restarted taking Ibuprofen three times a day. This is helping with her pain.  She is otherwise feeling fine & no complaints.  Allergies (verified): No Known Drug Allergies  Past History:  Past medical history reviewed for relevance to current acute and chronic problems.  Past Medical History: Reviewed history from 11/30/2007 and no changes required. Obesity migraine headaches h/o depression  Review of Systems MS:  Complains of joint pain; denies joint redness and joint swelling. Derm:  Complains of changes in color of skin and lesion(s). Neuro:  Denies numbness and tingling.  Physical Exam  General:  Well-developed,well-nourished,in no acute distress; alert,appropriate and cooperative throughout examination Lungs:  Normal respiratory effort, chest expands symmetrically. Lungs are clear to auscultation, no crackles or wheezes. Heart:  Normal rate and regular rhythm. S1  and S2 normal without gallop, murmur, click, rub or other extra sounds. Msk:  Lt knee:  normal ROM, no joint swelling, no redness over joints, no joint deformities, no joint instability, and no crepitation.  Anterior knee is TTP and medial aspect.  No ligament instability.  NEg drawer. Pulses:  R posterior tibial normal and R dorsalis pedis normal.   Extremities:  No edema RLE. Neurologic:  alert & oriented X3, sensation intact to light touch, and gait normal.   Skin:  Intact without suspicious lesions or rashes Psych:  Cognition and judgment appear intact. Alert and cooperative with normal attention span and concentration. No apparent delusions, illusions, hallucinations   Impression & Recommendations:  Problem # 1:  KNEE PAIN, LEFT, ACUTE (ICD-719.46) Assessment New  Discussed with pt to ice her knee three to four times a day  Continue using Ibuprofen.  Pt declineed additional pain medication. Will refer pt back to ortho. discussed xray today. Pt wishes to wait because she knows ortho will do one. Her updated medication list for this problem includes:    Ibuprofen 800 Mg Tabs (Ibuprofen) .Marland Kitchen... Take 1 tablet by mouth two times a day , pls fill after  march 25,2011  Orders: Orthopedic Referral (Ortho)  Complete Medication List: 1)  Ibuprofen 800 Mg Tabs (Ibuprofen) .... Take 1 tablet by mouth two times a day ,  pls fill after  march 25,2011 2)  Phentermine Hcl 37.5 Mg Tabs (Phentermine hcl) .... Take 1 tablet by mouth once a day 3)  Metronidazole 500 Mg Tabs (Metronidazole) .... Take 1 tablet by mouth two times a day  Patient Instructions: 1)  Please schedule a follow-up appointment as needed. 2)  Continue Motrin 800 mg three times a day 3)  We will schedule an appt with Dr Farris Has asap. 4)  Ice your knee 3-4 times a day for 15-20 mins.

## 2010-04-19 NOTE — Progress Notes (Signed)
Summary: [name]  [name]   Imported By: Lind Guest 10/24/2009 09:37:23  _____________________________________________________________________  External Attachment:    Type:   Image     Comment:   External Document

## 2010-04-19 NOTE — Progress Notes (Signed)
Summary: [name] / [name] / [name]   Imported By: Lind Guest 09/21/2009 08:53:40  _____________________________________________________________________  External Attachment:    Type:   Image     Comment:   External Document

## 2010-04-19 NOTE — Progress Notes (Signed)
Summary: [name]/ [name]  [name]/ [name]   Imported By: Lind Guest 10/02/2009 13:57:34  _____________________________________________________________________  External Attachment:    Type:   Image     Comment:   External Document

## 2010-04-19 NOTE — Letter (Signed)
Summary: MEDICAL RELEASE  MEDICAL RELEASE   Imported By: Lind Guest 02/23/2010 11:00:20  _____________________________________________________________________  External Attachment:    Type:   Image     Comment:   External Document

## 2010-04-19 NOTE — Letter (Signed)
Summary: [name] / [name] / [name]   Imported By: Lind Guest 01/19/2010 10:21:49  _____________________________________________________________________  External Attachment:    Type:   Image     Comment:   External Document

## 2010-04-19 NOTE — Letter (Signed)
Summary: Out of Work  [facility]  [address]   [place], [place] [address]   Phone: [phone]  Fax: [phone]    Jul 25, 2009   Employee:  YIDES SAIDI    To Whom It May Concern:   For Medical reasons, please excuse the above named employee from work for the following dates:  Start:   07/24/09  End:   07/31/09 expected return to work.  If you need additional information, please feel free to contact our office.         Sincerely,    Esperanza Sheets PA

## 2010-04-19 NOTE — Progress Notes (Signed)
Summary: [name]/ [facility]  [name]/ [name]   Imported By: Lind Guest 08/02/2009 11:02:24  _____________________________________________________________________  External Attachment:    Type:   Image     Comment:   External Document

## 2010-04-19 NOTE — Progress Notes (Signed)
Summary: [facility]  [facility]   Imported By: Lind Guest 08/04/2009 13:39:23  _____________________________________________________________________  External Attachment:    Type:   Image     Comment:   External Document

## 2010-04-19 NOTE — Letter (Signed)
Summary: [facility]phy/ [facility]ainer  [facility]/ [facility]   Imported By: Lind Guest 12/07/2009 13:34:19  _____________________________________________________________________  External Attachment:    Type:   Image     Comment:   External Document

## 2010-04-19 NOTE — Letter (Signed)
Summary: MEDICAL RELEASE  MEDICAL RELEASE   Imported By: Lind Guest 02/02/2010 09:58:41  _____________________________________________________________________  External Attachment:    Type:   Image     Comment:   External Document

## 2010-04-19 NOTE — Assessment & Plan Note (Signed)
Summary: office visit   Vital Signs:  Patient profile:   50 year old female Menstrual status:  hysterectomy Height:      62 inches Weight:      340.50 pounds BMI:     62.50 O2 Sat:      98 % Pulse rate:   75 / minute Pulse rhythm:   regular Resp:     16 per minute BP sitting:   120 / 84  (left arm) Cuff size:   xl  Vitals Entered By: Everitt Amber LPN (October 09, 2009 3:09 PM)  Nutrition Counseling: Patient's BMI is greater than 25 and therefore counseled on weight management options.                                                                    CC: Follow up, wants cultures to ensure STD is completely gone   Primary Care Provider:  [name] MD  CC:  Follow up and wants cultures to ensure STD is completely gone.  History of Present Illness: Pt. has had knee surgery recently, wants retesting for STD.Using condoms, partener not yet treated. Wants  to resime phentermine, and still plans to get surgery for obesity. Reports  that she is  doing well. Denies recent fever or chills. Denies sinus pressure, nasal congestion , ear pain or sore throat. Denies chest congestion, or cough productive of sputum. Denies chest pain, palpitations, PND, orthopnea or leg swelling. Denies abdominal pain, nausea, vomitting, diarrhea or constipation. Denies change in bowel movements or bloody stool. Denies dysuria , frequency, incontinence or hesitancy. Denies  joint pain, swelling, or reduced mobility. Denies headaches, vertigo, seizures. Denies depression, anxiety or insomnia. Denies  rash, lesions, or itch.     Current Medications (verified): 1)  Ibuprofen 800 Mg Tabs (Ibuprofen) .... Take 1 Tablet By Mouth Two Times A Day , Pls Fill After  March 25,2011 2)  Phentermine Hcl 37.5 Mg Tabs (Phentermine Hcl) .... 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 Eyes:  Denies blurring, discharge, eye irritation, and red eye. Neuro:  Denies seizures and weakness. Endo:  Denies cold intolerance, excessive hunger, excessive thirst, excessive urination, heat intolerance, polyuria, and weight change. Heme:  Denies abnormal bruising and bleeding. Allergy:  Denies hives or rash and itching eyes.  Physical Exam  General:  Well-developed,morbbidly obese,in no acute distress; alert,appropriate and cooperative throughout examination HEENT: No facial asymmetry,  EOMI, No sinus tenderness, TM's Clear, oropharynx  pink and moist.   Chest: Clear to auscultation bilaterally.  CVS: S1, S2, No murmurs, No S3.   Abd: Soft, Nontender.  MS: Adequate ROM spine, hips, shoulders and knees.  Ext:  No edema.   CNS: CN 2-12 intact, power tone and sensation normal throughout.   Skin: Intact, no visible lesions or rashes.  Psych: Good eye contact, normal affect.  Memory intact, fishy smelling vaginal d/c    Impression & Recommendations:  Problem # 1:  VAGINITIS&VULVOVAGINITIS DISEASES CLASS ELSW (ZOX-096.04) Assessment Comment Only  Orders: T-Wet Prep by Molecular Probe [phone]) T-Chlamydia & GC Probe, Genital ([id number])  Problem # 2:  HYPERTENSION (ICD-401.9) Assessment: Comment Only  BP today: 120/84 Prior BP: 120/78 (07/25/2009)  Labs Reviewed: K+: 4.9 (03/21/2008) Creat: : 0.64 (03/21/2008)   Chol: 156 (03/21/2008)    HDL: 52 (03/21/2008)   LDL: 94 (03/21/2008)   TG: 48 (03/21/2008)  Problem # 3:  MORBID OBESITY (ICD-278.01) Assessment: Deteriorated  Ht: 62 (10/09/2009)   Wt: 340.50 (10/09/2009)   BMI: 62.50 (10/09/2009)  Complete Medication List: 1)  Ibuprofen 800 Mg Tabs (Ibuprofen) .... Take 1 tablet by mouth two times a day , pls fill after  march 25,2011 2)  Phentermine Hcl 37.5 Mg Tabs (Phentermine hcl) .... Take 1 tablet by mouth once a day 3)  Metronidazole 500 Mg Tabs (Metronidazole) .... Take 1 tablet by mouth two times a day  Other Orders: Radiology Referral (Radiology)  Patient Instructions: 1)  Please schedule a follow-up appointment in 2 months. 2)  You need to lose weight. Consider a lower calorie diet and regular exercise.  3)  You will  resume phentermine one daily, pls. make the right food choices. 4)     Prescriptions: METRONIDAZOLE 500 MG TABS (METRONIDAZOLE) Take 1 tablet by mouth two times a day  #14 x 0   Entered and Authorized by:   [name] MD   Signed by:   [name] MD on 10/11/2009   Method used:   Handwritten   RxID:   [id number] PHENTERMINE HCL 37.5 MG TABS (PHENTERMINE HCL) Take 1 tablet by mouth once a day  #30 x 1   Entered by:   Everitt Amber LPN   Authorized by:   [name] MD   Signed by:   Everitt Amber LPN on 69/62/9528   Method used:   Printed then faxed to ...       [facility]  [address] [phone]* (retail)       [address]       [place], [place]  [address]       Ph: [phone] or [phone]       Fax: [phone]   RxID:   [id number]

## 2010-04-19 NOTE — Letter (Signed)
Summary: [facility]   Imported By: Lind Guest 04/12/2010 11:35:10  _____________________________________________________________________  External Attachment:    Type:   Image     Comment:   External Document

## 2010-04-19 NOTE — Assessment & Plan Note (Signed)
Summary: F UP   Vital Signs:  Patient profile:   50 year old female Menstrual status:  hysterectomy Height:      62 inches Weight:      334 pounds BMI:     61.31 O2 Sat:      98 % Pulse rate:   87 / minute Pulse rhythm:   regular Resp:     16 per minute BP sitting:   118 / 72  (left arm) Cuff size:   xl  Vitals Entered By: Everitt Amber LPN (July 12, 2009 3:45 PM)  Nutrition Counseling: Patient's BMI is greater than 25 and therefore counseled on weight management options. CC: Follow up chronic problems   Primary Care Provider:  [name] MD  CC:  Follow up chronic problems.  History of Present Illness: Reports  that she has been doing fairly well. Denies recent fever or chills. Denies sinus pressure, nasal congestion , ear pain or sore throat. Denies chest congestion, or cough productive of sputum. Denies chest pain, palpitations, PND, orthopnea or leg swelling. Denies abdominal pain, nausea, vomitting, diarrhea or constipation. Denies change in bowel movements or bloody stool. Denies dysuria , frequency, incontinence or hesitancy. Reports increased bilateral knee pain and stiffness with reduced mobility. Denies headaches, vertigo, seizures. Denies depression, anxiety or insomnia. Denies  rash, lesions, or itch.     Current Medications (verified): 1)  Ibuprofen 800 Mg Tabs (Ibuprofen) .... Take 1 Tablet By Mouth Two Times A Day , Pls Fill After  March 25,2011  Allergies (verified): No Known Drug Allergies  Review of Systems General:  Complains of fatigue and sleep disorder. Eyes:  Denies blurring and discharge. GU:  Complains of discharge; fishy smelling. MS:  Complains of joint pain, low back pain, mid back pain, and stiffness; inc bilateral knee pain and back pain which is chronic. Neuro:  Complains of headaches; denies seizures and sensation of room spinning; improved. Endo:  Denies cold intolerance, excessive hunger, excessive thirst, excessive  urination, heat intolerance, polyuria, and weight change. Heme:  Denies abnormal bruising and bleeding. Allergy:  Complains of seasonal allergies.  Physical Exam  General:  Well-developed,morbidly obese,in no acute distress; alert,appropriate and cooperative throughout examination HEENT: No facial asymmetry,  EOMI, No sinus tenderness, TM's Clear, oropharynx  pink and moist.   Chest: Clear to auscultation bilaterally.  CVS: S1, S2, No murmurs, No S3.   Abd: Soft, Nontender.  MS: Adequate ROM spine, hips, shoulders and reduced in  knees, with crepitus  Ext: No edema.   CNS: CN 2-12 intact, power tone and sensation normal throughout.   Skin: Intact, no visible lesions or rashes.  Psych: Good eye contact, normal affect.  Memory intact, not anxious or depressed appearing.    Impression & Recommendations:  Problem # 1:  KNEE PAIN, BILATERAL (ICD-719.46) Assessment Deteriorated  Her updated medication list for this problem includes:    Ibuprofen 800 Mg Tabs (Ibuprofen) .Marland Kitchen... Take 1 tablet by mouth two times a day , pls fill after  march 25,2011  Orders: Depo- Medrol 80mg  (J1040) Ketorolac-Toradol 15mg  (E4540) Admin of Therapeutic Inj  intramuscular or subcutaneous (98119)  Problem # 2:  DEPRESSION (ICD-311) Assessment: Improved  Problem # 3:  MORBID OBESITY (ICD-278.01) Assessment: Deteriorated  Ht: 62 (07/12/2009)   Wt: 334 (07/12/2009)   BMI: 61.31 (07/12/2009)  Problem # 4:  HYPERTENSION (ICD-401.9) Assessment: Improved  Orders: T-Basic Metabolic Panel [id number])  BP today: 118/72 Prior BP: 136/76 (06/12/2009)  Labs Reviewed: K+: 4.9 (03/21/2008) Creat: :  0.64 (03/21/2008)   Chol: 156 (03/21/2008)   HDL: 52 (03/21/2008)   LDL: 94 (03/21/2008)   TG: 48 (03/21/2008)  Complete Medication List: 1)  Ibuprofen 800 Mg Tabs (Ibuprofen) .... Take 1 tablet by mouth two times a day , pls fill after  march 25,2011 2)  Phentermine Hcl 37.5 Mg Tabs (Phentermine hcl) ....  Take 1 tablet by mouth once a day 3)  Metronidazole 500 Mg Tabs (Metronidazole) .... Take 1 tablet by mouth two times a day  Other Orders: T-Lipid Profile (04540-98119) T-TSH ([id number]) T-CBC w/Diff [id number])  Patient Instructions: 1)  Please schedule a CPE appointment in 2 months. 2)  It is important that you exercise regularly at least 20 minutes 5 times a week. If you develop chest pain, have severe difficulty breathing, or feel very tired , stop exercising immediately and seek medical attention. 3)  You need to lose weight. Consider a lower calorie diet and regular exercise.  4)  Meds are sent in for the infection, also phentermine 5)  BMP prior to visit, ICD-9: 6)  Lipid Panel prior to visit, ICD-9: 7)  TSH prior to visit, ICD-9:   fasting asap 8)  CBC w/ Diff prior to visit, ICD-9: 9)  Vitamin D Prescriptions: IBUPROFEN 800 MG TABS (IBUPROFEN) Take 1 tablet by mouth two times a day , pls fill after  March 25,2011  #60 x 4   Entered by:   Everitt Amber LPN   Authorized by:   [name] MD   Signed by:   Everitt Amber LPN on 62/95/2841   Method used:   [facility]ron[facility] to        [facility] [phone][facility] (retail)       [address]       [place], [place]  [address]       Ph: [phone] or [phone]       Fax: [phone]   RxID:   [id number] METRONIDAZOLE 500 MG TABS (METRONIDAZOLE) Take 1 tablet by mouth two times a day  #14 x 0   Entered and Authorized by:   [name] MD   Signed by:   [name] MD on 07/12/2009   Method used:   Printed then faxed to ...       [facility]  [address] [phone]* (retail)       [address]       [place], [place]  [address]       Ph: [phone] or [phone]       Fax: [phone]   RxID:   [id number] PHENTERMINE HCL 37.5 MG TABS (PHENTERMINE HCL) Take 1 tablet by mouth once a day  #30 x 1   Entered and Authorized by:   [name] MD   Signed by:    [name] MD on 07/12/2009   Method used:   Printed then faxed to ...       [facility]  [address] [phone]* (retail)       [address]       [place], [place]  [address]       Ph: [phone] or [phone]       Fax: [phone]   RxID:   [id number]    Medication Administration  Injection # 1:    Medication: Depo- Medrol 80mg     Diagnosis: KNEE PAIN, BILATERAL (  WJX-914.78)    Route: IM    Site: RUOQ gluteus    Exp Date: 02/2010    Lot #: Pamala Hurry     Mfr: Pharmacia    Comments: 80mg  given     Patient tolerated injection without complications    Given by: Everitt Amber LPN (July 12, 2009 4:38 PM)  Injection # 2:    Medication: Ketorolac-Toradol 15mg     Diagnosis: KNEE PAIN, BILATERAL (ICD-719.46)    Route: IM    Site: LUOQ gluteus    Exp Date: 02/2011    Lot #: [id number]     Mfr: novaplus    Comments: 60mg  given     Patient tolerated injection without complications    Given by: Everitt Amber LPN (July 12, 2009 4:39 PM)  Orders Added: 1)  Est. Patient Level IV [99214] 2)  T-Basic Metabolic Panel [80048-22910] 3)  T-Lipid Profile [80061-22930] 4)  T-TSH [29562-13086] 5)  T-CBC w/Diff [[id number]] 6)  Depo- Medrol 80mg  [J1040] 7)  Ketorolac-Toradol 15mg  [J1885] 8)  Admin of Therapeutic Inj  intramuscular or subcutaneous [28413]

## 2010-04-19 NOTE — Letter (Signed)
Summary: [facility] papers  [facility] papers   Imported By: Lind Guest 08/04/2009 09:59:52  _____________________________________________________________________  External Attachment:    Type:   Image     Comment:   External Document

## 2010-04-19 NOTE — Letter (Signed)
Summary: [facility]  [facility]   Imported By: Rudene Anda 08/03/2009 09:03:09  _____________________________________________________________________  External Attachment:    Type:   Image     Comment:   External Document

## 2010-04-19 NOTE — Progress Notes (Signed)
Summary: [name]/ [name]  [name]/ [name]   Imported By: Lind Guest 08/24/2009 13:36:39  _____________________________________________________________________  External Attachment:    Type:   Image     Comment:   External Document

## 2010-04-19 NOTE — Progress Notes (Signed)
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 [phone] Initial call taken by: Rudene Anda,  June 29, 2009 10:20 AM  Follow-up for Phone Call        advised needs cultures, patient has appt next week Follow-up by: Adella Hare LPN,  June 29, 2009 10:25 AM

## 2010-04-20 ENCOUNTER — Encounter (HOSPITAL_COMMUNITY): Payer: Self-pay | Admitting: Psychiatry

## 2010-04-20 ENCOUNTER — Ambulatory Visit (HOSPITAL_COMMUNITY): Admit: 2010-04-20 | Payer: Self-pay | Admitting: Psychiatry

## 2010-04-23 ENCOUNTER — Encounter: Payer: Self-pay | Admitting: Family Medicine

## 2010-04-25 NOTE — Letter (Signed)
Summary: cane  cane   Imported By: Lind Guest 04/20/2010 09:20:14  _____________________________________________________________________  External Attachment:    Type:   Image     Comment:   External Document

## 2010-04-25 NOTE — Assessment & Plan Note (Signed)
Summary: follow up   Vital Signs:  Patient profile:   50 year old female Menstrual status:  hysterectomy Height:      62 inches Weight:      359.75 pounds BMI:     66.04 O2 Sat:      99 % on Room air Pulse rate:   67 / minute Pulse rhythm:   regular Resp:     16 per minute BP sitting:   110 / 62  (left arm)  Vitals Entered By: Adella Hare LPN (April 18, 2010 11:05 AM)  Nutrition Counseling: Patient's BMI is greater than 25 and therefore counseled on weight management options.  O2 Flow:  Room air CC: follow-up visit Is Patient Diabetic? No Comments did not bring meds to ov   Primary Care Provider:  [name] MD  CC:  follow-up visit.  History of Present Illness: pt still being followed by ortho who operated on her left knee, she saw him 2 months ago and has a 4 month follow up.The pain is much less, however she is unable to weigh bear or even sit in 1 area for a prolonged time. She is seeing a therapiist weekly.this is helpful to her. She is unable to work, her 3 daughtewrs and 2 graqndchildren live with her. She is applying for disability, sttates she is unable to sit or stand for any significant time  Allergies (verified): No Known Drug Allergies  Social History: 3 children separated unemployed, seeking disability 2012 Never Smoked Alcohol use-no Drug use-no  Review of Systems      See HPI General:  Complains of fatigue and malaise. Eyes:  Denies discharge and red eye. ENT:  Denies hoarseness, nasal congestion, and sinus pressure. CV:  Denies chest pain or discomfort, palpitations, and swelling of feet. Resp:  Denies cough and sputum productive. GI:  Denies abdominal pain, constipation, diarrhea, nausea, and vomiting. GU:  Denies dysuria and urinary frequency. MS:  Complains of joint pain, low back pain, mid back pain, and stiffness. Psych:  Complains of anxiety, depression, easily tearful, and mental problems; denies suicidal thoughts/plans,  thoughts of violence, and unusual visions or sounds. Heme:  Denies abnormal bruising and bleeding. Allergy:  Denies hives or rash and itching eyes.  Physical Exam  General:  Well-developed,morbbidly obese,in no acute distress; alert,appropriate and cooperative throughout examination HEENT: No facial asymmetry,  EOMI, No sinus tenderness, TM's Clear, oropharynx  pink and moist.   Chest: Clear to auscultation bilaterally.  CVS: S1, S2, No murmurs, No S3.   Abd: Soft, Nontender.  MS: decreased  ROM spine,  hips and and knees.  Ext: No edema.   CNS: CN 2-12 intact, power tone and sensation normal throughout.   Skin: Intact, no visible lesions or rashes.  Psych: Good eye contact,flat affect.  Memory intact, depressd appearing   Impression & Recommendations:  Problem # 1:  KNEE PAIN, LEFT (ICD-719.46) Assessment Improved  Her updated medication list for this problem includes:    Ibuprofen 800 Mg Tabs (Ibuprofen) .Marland Kitchen... Take 1 tablet by mouth two times a day , pls fill after  march 25,2011 s/p replacement in 2011  Problem # 2:  DEPRESSION (ICD-311) Assessment: Unchanged oin medication from psych and involved in intense therapy  Problem # 3:  MORBID OBESITY (ICD-278.01) Assessment: Deteriorated  Orders: T-Lipid Profile ([id number])  Ht: 62 (04/18/2010)   Wt: 359.75 (04/18/2010)   BMI: 66.04 (04/18/2010) therapeutic lifestyle change discussed and encouraged pt to resume phentermine, her ability to exercise is  limited  Problem # 4:  MIGRAINE VARIANT (ICD-346.20) Assessment: Improved  Her updated medication list for this problem includes:    Ibuprofen 800 Mg Tabs (Ibuprofen) .Marland Kitchen... Take 1 tablet by mouth two times a day , pls fill after  march 25,2011  Complete Medication List: 1)  Ibuprofen 800 Mg Tabs (Ibuprofen) .... Take 1 tablet by mouth two times a day , pls fill after  march 25,2011 2)  Phentermine Hcl 37.5 Mg Caps (Phentermine hcl) .... Take 1 tablet by mouth once a  day 3)  Vitamin D (ergocalciferol) 50000 Unit Caps (Ergocalciferol) .... One capsule once weekly 4)  Metformin Hcl 500 Mg Tabs (Metformin hcl) .... Take 1 tablet by mouth two times a day  Other Orders: T-Basic Metabolic Panel [id number]) T-Hepatic Function [id number]) T-CBC w/Diff [phone]) T- Hemoglobin A1C [phone]) T-Vitamin D (25-Hydroxy) [phone])  Patient Instructions: 1)  Please schedule a follow-up appointment in 2 months. 2)  It is important that you exercise regularly at least 20 minutes 5 times a week. If you develop chest pain, have severe difficulty breathing, or feel very tired , stop exercising immediately and seek medical attention. 3)  You need to lose weight. Consider a lower calorie diet and regular exercise.  4)  BMP prior to visit, ICD-9: 5)  Hepatic Panel prior to visit, ICD-9: 6)  Lipid Panel prior to visit, ICD-9: 7)  HbgA1C prior to visit, ICD-9:   fasting today 8)  Vitamin D 9)  CBC w/ Diff prior to visit, ICD-9: Prescriptions: METFORMIN HCL 500 MG TABS (METFORMIN HCL) Take 1 tablet by mouth two times a day  #60 x 4   Entered and Authorized by:   [name] MD   Signed by:   [name] MD on 04/19/2010   Method used:   Historical   RxID:   [id number] VITAMIN D (ERGOCALCIFEROL) 50000 UNIT CAPS (ERGOCALCIFEROL) one capsule once weekly  #4 x 4   Entered and Authorized by:   [name] MD   Signed by:   [name] MD on 04/19/2010   Method used:   Historical   RxID:   [id number]    Orders Added: 1)  Est. Patient Level IV [88416] 2)  T-Basic Metabolic Panel [60630-16010] 3)  T-Lipid Profile [93235-57322] 4)  T-Hepatic Function [80076-22960] 5)  T-CBC w/Diff [02542-70623] 6)  T- Hemoglobin A1C [83036-23375] 7)  T-Vitamin D (25-Hydroxy) [76283-15176]

## 2010-04-27 ENCOUNTER — Encounter (INDEPENDENT_AMBULATORY_CARE_PROVIDER_SITE_OTHER): Payer: Medicaid Other | Admitting: Psychiatry

## 2010-04-27 DIAGNOSIS — F331 Major depressive disorder, recurrent, moderate: Secondary | ICD-10-CM

## 2010-05-03 NOTE — Miscellaneous (Signed)
  Clinical Lists Changes  Medications: Added new medication of EQL IRON SUPPLEMENT THERAPY 325 MG TABS (FERROUS SULFATE) Take 1 tablet by mouth two times a day - Signed Added new medication of FOLIC ACID 1 MG TABS (FOLIC ACID) Take 1 tablet by mouth once a day - Signed Rx of EQL IRON SUPPLEMENT THERAPY 325 MG TABS (FERROUS SULFATE) Take 1 tablet by mouth two times a day;  #60 x 5;  Signed;  Entered by: [name] MD;  Authorized by: [name] MD;  Method used: Electronically to [facility] [phone]*, [address], [place], [place], [place]  [address], Ph: [phone] or [phone], Fax: [phone] Rx of FOLIC ACID 1 MG TABS (FOLIC ACID) Take 1 tablet by mouth once a day;  #30 x 5;  Signed;  Entered by: [name] MD;  Authorized by: [name] MD;  Method used: Historical    Prescriptions: FOLIC ACID 1 MG TABS (FOLIC ACID) Take 1 tablet by mouth once a day  #30 x 5   Entered and Authorized by:   [name] MD   Signed by:   [name] MD on 04/23/2010   Method used:   Historical   RxID:   [id number] EQL IRON SUPPLEMENT THERAPY 325 MG TABS (FERROUS SULFATE) Take 1 tablet by mouth two times a day  #60 x 5   Entered and Authorized by:   [name] MD   Signed by:   [name] MD on 04/23/2010   Method used:   Electronically to        [facility] [phone]* (retail)       [address]       [place], [place]  [address]       Ph: [phone] or [phone]       Fax: [phone]   RxID:   [id number]

## 2010-05-04 ENCOUNTER — Encounter (HOSPITAL_COMMUNITY): Payer: Medicaid Other | Admitting: Psychiatry

## 2010-05-08 ENCOUNTER — Encounter: Payer: Self-pay | Admitting: Family Medicine

## 2010-05-10 ENCOUNTER — Encounter (INDEPENDENT_AMBULATORY_CARE_PROVIDER_SITE_OTHER): Payer: Medicaid Other | Admitting: Psychiatry

## 2010-05-10 DIAGNOSIS — F331 Major depressive disorder, recurrent, moderate: Secondary | ICD-10-CM

## 2010-05-15 NOTE — Letter (Signed)
Summary: lab add on  lab add on   Imported By: Luann Bullins 05/08/2010 16:32:37  _____________________________________________________________________  External Attachment:    Type:   Image     Comment:   External Document

## 2010-05-18 ENCOUNTER — Encounter (INDEPENDENT_AMBULATORY_CARE_PROVIDER_SITE_OTHER): Payer: Medicaid Other | Admitting: Psychiatry

## 2010-05-18 DIAGNOSIS — F331 Major depressive disorder, recurrent, moderate: Secondary | ICD-10-CM

## 2010-05-24 ENCOUNTER — Encounter (INDEPENDENT_AMBULATORY_CARE_PROVIDER_SITE_OTHER): Payer: Medicaid Other | Admitting: Psychiatry

## 2010-05-24 DIAGNOSIS — F331 Major depressive disorder, recurrent, moderate: Secondary | ICD-10-CM

## 2010-05-25 ENCOUNTER — Encounter: Payer: Self-pay | Admitting: Family Medicine

## 2010-05-29 NOTE — Letter (Signed)
Summary: quadcane  quadcane   Imported By: Lind Guest 05/25/2010 13:20:09  _____________________________________________________________________  External Attachment:    Type:   Image     Comment:   External Document

## 2010-06-04 LAB — BASIC METABOLIC PANEL
Calcium: 8.4 mg/dL (ref 8.4–10.5)
Calcium: 8.4 mg/dL (ref 8.4–10.5)
Calcium: 8.9 mg/dL (ref 8.4–10.5)
Chloride: 100 mEq/L (ref 96–112)
Chloride: 99 mEq/L (ref 96–112)
Creatinine, Ser: 0.65 mg/dL (ref 0.4–1.2)
Creatinine, Ser: 0.72 mg/dL (ref 0.4–1.2)
GFR calc Af Amer: >60 mL/min (ref >60)
GFR calc Af Amer: >60 mL/min (ref >60)
GFR calc Af Amer: >60 mL/min (ref >60)
GFR calc non Af Amer: >60 mL/min (ref >60)
GFR calc non Af Amer: >60 mL/min (ref >60)
GFR calc non Af Amer: >60 mL/min (ref >60)
Glucose, Bld: 105 mg/dL — ABNORMAL HIGH (ref 70–99)
Glucose, Bld: 113 mg/dL — ABNORMAL HIGH (ref 70–99)
Potassium: 3.4 mEq/L — ABNORMAL LOW (ref 3.5–5.1)
Potassium: 4.2 mEq/L (ref 3.5–5.1)
Sodium: 135 mEq/L (ref 135–145)
Sodium: 135 mEq/L (ref 135–145)
Sodium: 135 mEq/L (ref 135–145)
Sodium: 136 mEq/L (ref 135–145)

## 2010-06-04 LAB — CBC
HCT: 29.2 % — ABNORMAL LOW (ref 36.0–46.0)
HCT: 32.7 % — ABNORMAL LOW (ref 36.0–46.0)
Hemoglobin: 10.1 g/dL — ABNORMAL LOW (ref 12.0–15.0)
Hemoglobin: 10.7 g/dL — ABNORMAL LOW (ref 12.0–15.0)
Hemoglobin: 13 g/dL (ref 12.0–15.0)
MCHC: 32.5 g/dL (ref 30.0–36.0)
MCHC: 33.1 g/dL (ref 30.0–36.0)
MCV: 78 fL (ref 78.0–100.0)
MCV: 78.3 fL (ref 78.0–100.0)
Platelets: 276 10*3/uL (ref 150–400)
RBC: 3.92 MIL/uL (ref 3.87–5.11)
RBC: 4.14 MIL/uL (ref 3.87–5.11)
RBC: 5.15 MIL/uL — ABNORMAL HIGH (ref 3.87–5.11)
RDW: 14.7 % (ref 11.5–15.5)
RDW: 15 % (ref 11.5–15.5)
RDW: 15.2 % (ref 11.5–15.5)
WBC: 10.7 10*3/uL — ABNORMAL HIGH (ref 4.0–10.5)
WBC: 11.7 10*3/uL — ABNORMAL HIGH (ref 4.0–10.5)

## 2010-06-04 LAB — URINALYSIS, ROUTINE W REFLEX MICROSCOPIC
Bilirubin Urine: NEGATIVE
Nitrite: NEGATIVE
Protein, ur: NEGATIVE mg/dL
Specific Gravity, Urine: 1.018 (ref 1.005–1.030)
Urobilinogen, UA: 0.2 mg/dL (ref 0.0–1.0)

## 2010-06-04 LAB — URINE MICROSCOPIC-ADD ON

## 2010-06-04 LAB — TYPE AND SCREEN
ABO/RH(D): B POS
Antibody Screen: NEGATIVE

## 2010-06-04 LAB — PROTIME-INR
INR: 1.05 (ref 0.00–1.49)
INR: 1.28 (ref 0.00–1.49)
INR: 1.87 — ABNORMAL HIGH (ref 0.00–1.49)
Prothrombin Time: 13.6 seconds (ref 11.6–15.2)
Prothrombin Time: 21.4 seconds — ABNORMAL HIGH (ref 11.6–15.2)

## 2010-06-04 LAB — APTT: aPTT: 27 seconds (ref 24–37)

## 2010-06-04 LAB — ABO/RH: ABO/RH(D): B POS

## 2010-06-07 ENCOUNTER — Encounter (INDEPENDENT_AMBULATORY_CARE_PROVIDER_SITE_OTHER): Payer: Medicaid Other | Admitting: Psychiatry

## 2010-06-07 DIAGNOSIS — F331 Major depressive disorder, recurrent, moderate: Secondary | ICD-10-CM

## 2010-06-13 ENCOUNTER — Encounter (INDEPENDENT_AMBULATORY_CARE_PROVIDER_SITE_OTHER): Payer: Medicaid Other | Admitting: Psychiatry

## 2010-06-13 DIAGNOSIS — F331 Major depressive disorder, recurrent, moderate: Secondary | ICD-10-CM

## 2010-06-21 ENCOUNTER — Encounter: Payer: Self-pay | Admitting: Family Medicine

## 2010-06-28 LAB — BASIC METABOLIC PANEL
BUN: 8 mg/dL (ref 6–23)
CO2: 29 mEq/L (ref 19–32)
Chloride: 101 mEq/L (ref 96–112)
Creatinine, Ser: 0.52 mg/dL (ref 0.4–1.2)

## 2010-06-28 LAB — DIFFERENTIAL
Basophils Relative: 1 % (ref 0–1)
Eosinophils Absolute: 0.1 10*3/uL (ref 0.0–0.7)
Eosinophils Relative: 1 % (ref 0–5)
Monocytes Relative: 6 % (ref 3–12)
Neutrophils Relative %: 51 % (ref 43–77)

## 2010-06-28 LAB — RAPID URINE DRUG SCREEN, HOSP PERFORMED
Opiates: NOT DETECTED
Tetrahydrocannabinol: NOT DETECTED

## 2010-06-28 LAB — CBC
MCHC: 32.6 g/dL (ref 30.0–36.0)
MCV: 76.9 fL — ABNORMAL LOW (ref 78.0–100.0)
Platelets: 361 10*3/uL (ref 150–400)

## 2010-07-05 ENCOUNTER — Encounter: Payer: Self-pay | Admitting: Family Medicine

## 2010-07-06 ENCOUNTER — Encounter: Payer: Self-pay | Admitting: Family Medicine

## 2010-07-09 ENCOUNTER — Ambulatory Visit (INDEPENDENT_AMBULATORY_CARE_PROVIDER_SITE_OTHER): Payer: Medicaid Other | Admitting: Family Medicine

## 2010-07-09 ENCOUNTER — Encounter: Payer: Self-pay | Admitting: Family Medicine

## 2010-07-09 DIAGNOSIS — R7301 Impaired fasting glucose: Secondary | ICD-10-CM

## 2010-07-09 DIAGNOSIS — E669 Obesity, unspecified: Secondary | ICD-10-CM

## 2010-07-09 DIAGNOSIS — E119 Type 2 diabetes mellitus without complications: Secondary | ICD-10-CM

## 2010-07-09 DIAGNOSIS — R5381 Other malaise: Secondary | ICD-10-CM

## 2010-07-09 DIAGNOSIS — E1169 Type 2 diabetes mellitus with other specified complication: Secondary | ICD-10-CM

## 2010-07-09 DIAGNOSIS — F3289 Other specified depressive episodes: Secondary | ICD-10-CM

## 2010-07-09 DIAGNOSIS — R5383 Other fatigue: Secondary | ICD-10-CM

## 2010-07-09 DIAGNOSIS — F329 Major depressive disorder, single episode, unspecified: Secondary | ICD-10-CM

## 2010-07-09 MED ORDER — PHENTERMINE HCL 37.5 MG PO TABS: 37.5000 mg | ORAL_TABLET | Freq: Every day | ORAL | Status: DC

## 2010-07-09 MED ORDER — METFORMIN HCL 500 MG PO TABS: 500.0000 mg | ORAL_TABLET | Freq: Two times a day (BID) | ORAL | Status: DC

## 2010-07-09 MED ORDER — IBUPROFEN 800 MG PO TABS: 800.0000 mg | ORAL_TABLET | Freq: Two times a day (BID) | ORAL | Status: DC

## 2010-07-09 NOTE — Patient Instructions (Signed)
F/u in 4 months. hBA1c non fasting also chem 7 early July, ( first week)  It is important that you exercise regularly at least 30 minutes 5 times a week. If you develop chest pain, have severe difficulty breathing, or feel very tired, stop exercising immediately and seek medical attention   A healthy diet is rich in fruit, vegetables and whole grains. Poultry fish, nuts and beans are a healthy choice for protein rather then red meat. A low sodium diet and drinking 64 ounces of water daily is generally recommended. Oils and sweet should be limited. Carbohydrates especially for those who are diabetic or overweight, should be limited to 34-45 gram per meal. It is important to eat on a regular schedule, at least 3 times daily. Snacks should be primarily fruits, vegetables or nuts.   Regular medicine from this office is metformin, ibuprofen and phentermine

## 2010-07-13 ENCOUNTER — Encounter (HOSPITAL_COMMUNITY): Payer: Medicaid Other | Admitting: Psychiatry

## 2010-07-23 ENCOUNTER — Encounter: Payer: Self-pay | Admitting: Family Medicine

## 2010-07-23 DIAGNOSIS — R7303 Prediabetes: Secondary | ICD-10-CM | POA: Insufficient documentation

## 2010-07-23 NOTE — Assessment & Plan Note (Signed)
deteriorated 

## 2010-07-23 NOTE — Assessment & Plan Note (Signed)
Pt to start metformin, her HBA1C is 6.2 and she is morbidly obese

## 2010-07-23 NOTE — Progress Notes (Signed)
  Subjective:    Patient ID: [name], female    DOB: 30-May-1960, 50 y.o.   MRN: [id number]  HPI The PT is here for follow up and re-evaluation of chronic medical conditions, medication management and review of recent lab and radiology data.  Preventive health is updated, specifically  Cancer screening,  and Immunization.   She is experiencing increased stress and depression, losing medicaid shortly and continually challenged financially She also has a new diagnosis of prediabetes from recent lab data, and will start metformin. She understands the need to be aggressive with lifestyle change to reduce the progression of the disease.     Review of Systems Denies recent fever or chills. Denies sinus pressure, nasal congestion, ear pain or sore throat. Denies chest congestion, productive cough or wheezing. Denies chest pains, palpitations, paroxysmal nocturnal dyspnea, orthopnea and leg swelling Denies abdominal pain, nausea, vomiting,diarrhea or constipation.  Denies rectal bleeding or change in bowel movement. Denies dysuria, frequency, hesitancy or incontinence. C/o  joint pain, nd limitation in mobility. Denies headaches, seizure, numbness, or tingling. C/o  Depression and  Anxiety being increased in recent times. Denies skin break down or rash.        Objective:   Physical Exam Patient alert and oriented and in no Cardiopulmonary distress.Morbidly obese, tearful.  HEENT: No facial asymmetry, EOMI, no sinus tenderness, TM's clear, Oropharynx pink and moist.  Neck supple no adenopathy.  Chest: Clear to auscultation bilaterally.  CVS: S1, S2 no murmurs, no S3.  ABD: Soft non tender. Bowel sounds normal.  Ext: No edema  MS: Adequate ROM spine, shoulders, hips and reduced in  knees.  Skin: Intact, no ulcerations or rash noted.  Psych: Good eye contact, flatl affect. Memory intact  depressed appearing.  CNS: CN 2-12 intact, power, tone and sensation normal  throughout.        Assessment & Plan:

## 2010-07-23 NOTE — Assessment & Plan Note (Signed)
Deteriorated. Patient re-educated about  the importance of commitment to a  minimum of 150 minutes of exercise per week. The importance of healthy food choices with portion control discussed. Encouraged to start a food diary, count calories and to consider  joining a support group. Sample diet sheets offered. Goals set by the patient for the next several months.    

## 2010-07-31 NOTE — Cardiovascular Report (Signed)
[name], [name] NO.:  [id number]   MEDICAL RECORD NO.:  [id number]          PATIENT TYPE:  OIB   LOCATION:  2899                         FACILITY:  [facility]   PHYSICIAN:  Darlin Priestly, MD  DATE OF BIRTH:  08/06/1960   DATE OF PROCEDURE:  12/04/2006  DATE OF DISCHARGE:                            CARDIAC CATHETERIZATION   PROCEDURE:  Heads-up tilt table testing with Isuprel infusion.   ATTENDING:  Darlin Priestly, MD   COMPLICATIONS:  None.   INDICATIONS:  [name] is a 50 year old female patient of Dr. Kem Boroughs and Dr. [name] with a history of hypertension,  migraine headaches and obesity who was referred to Dr. Domingo Sep for  palpitations and multiple episodes of syncope.  She did undergo  echocardiogram revealing normal LV function with trivial TR and trivial  AI.  She also had a Cardiolite scan negative for ischemia.  She is now  referred for tilt table testing to rule out possible neurocardiogenic  etiology.   DESCRIPTION OF PROCEDURE:  After obtaining informed consent, the patient  was brought to the cardiac catheterization lab in a fasting state.  She  was then placed in a supine position.  Hemodynamic measures obtained.  Resting blood pressure was 136/77, resting heart rate 61.  She was then  monitored for approximately 5 minutes.  She was then tilted to 7 degrees  of heads-up position which was then maintained x30 minutes.  She had no  significant change in heart rate or blood pressure.  She was again  returned to supine position.  Isuprel infusion was begun at 0.5 mcg.  This was ultimately up titrated to 2.5 mcg to achieve a heart rate of  90.  She then titled back to 7 degrees of heads-up position.  After  approximately 2 minutes heads-up, she did have a drop in her heart rate  from 90-47.  She did complain of feeling lightheaded, although she had  no significant change in her blood pressure and had no true loss of  consciousness.  She was inadvertently returned to supine position,  however, this was quickly reversed and she was replaced back in upright  position where she had no further significant change in her heart rate  or blood pressure and remained hemodynamically stable.  After  approximately 15 minutes in the upright position, she was returned to  the supine position and Isuprel was discontinued.   CONCLUSIONS:  Negative heads-up tilt table testing for significant  hemodynamic changes.  She did have a drop in her heart rate from 90-47  with Isuprel infusion, although she had no significant change in her  blood pressure or no loss of conscious.      Darlin Priestly, MD  Electronically Signed    RHM/MEDQ  D:  12/04/2006  T:  12/04/2006  Job:  [id number]   cc:   Milus Mallick. Lodema Hong, M.D.  Dani Gobble, MD

## 2010-07-31 NOTE — Discharge Summary (Signed)
[name], REIERSON NO.:  [id number]   MEDICAL RECORD NO.:  [id number]          PATIENT TYPE:  IPS   LOCATION:  0301                          [facility]  [facility]   PHYSICIAN:  Jasmine Pang, M.D. DATE OF BIRTH:  05/18/1960   DATE OF ADMISSION:  05/17/2008  DATE OF DISCHARGE:  05/20/2008                               DISCHARGE SUMMARY   IDENTIFICATION:  This is a 50 year old African American female from  [place] who was admitted on a voluntary basis.   HISTORY OF PRESENT ILLNESS:  The patient stated she I need to learn how  to cope with some things.  She has conflict with her husband from whom  she is separated, but are trying to get back together and he then  changed his mind.  He said I do not want you recently.  Her job is  stressful.  She has been the company's downsize and they are distracting  their employees to take on extra duties.  She states she got upset on  the day before admission.   PAST PSYCHIATRIC HISTORY:  The patient has been in counseling in the  past with Dr. Kieth Brightly.  She had been at the [facility].  She has been on Wellbutrin in the past.  She  does not want to be on an antidepressant at this point she says.  The  patient was at the [facility] inpatient.  She was  depressed and suicidal on that admission.   SUBSTANCE ABUSE HISTORY:  No alcohol or drug history.   FAMILY HISTORY:  None reported.   MEDICAL HISTORY:  The patient has a history of hypertension, migraines,  and neck surgery in 2009.   MEDICATIONS:  Calcium, phentermine, antihypertensive.   ALLERGIES:  No known drug allergies.   PHYSICAL FINDINGS:  There were no acute physical or medical problems  noted.  Her physical exam was done in the ED prior to admission here.  There were no acute medical or surgical problems noted.   HOSPITAL COURSE:  Upon admission, the patient was restarted on her  antihypertensive,  which was Maxzide 37.5/25 mg p.o. q. day.  She was  also placed on Ambien 10 mg p.o. q.h.s. p.r.n. insomnia.  She initially  stated she did not want medication.  However, she finally agreed to a  trial of Celexa 20 mg daily.  I discussed the fact that her depression  would probably not improve significantly she was not treated.  On  05/19/2008, she was depressed and anxious.  She was somewhat tearful.  Her daughter had visited last night was very supportive.  She is not  sure of her husband is going to be staying with her and she is very sad  about this.  We faxed in disability paper work to [employer] at the patient's request.  A work letter was provided.  On May 20, 2008, mental status had improved markedly from admission status.  Sleep was good.  Appetite was good.  Mood was less depressed, less  anxious.  Affect  was consistent with mood.  There was no suicidal or  homicidal ideation.  No thoughts of self-injurious behavior.  No  auditory or visual hallucinations.  No paranoia or delusions.  Thoughts  were logical and goal-directed.  Thought content, no predominant theme.  Cognitive was grossly intact.  Insight good.  Judgment good.  Impulse  control good.  The patient wanted to go home today and she was felt to  be safe for discharge.   DISCHARGE DIAGNOSES:  Axis I:  Major depression, recurrent, severe  without psychosis.  Axis II:  None.  Axis III:  Hypertension, migraine headaches, neck surgery in 2009.  Axis IV:  Severe (marital conflict, burden of psychiatric illness,  occupational stress).  Axis V:  Global assessment of functioning was 50 upon discharge.  GAF  was 35 upon admission.  GAF highest past year was 65 to 70.   DISCHARGE PLAN:  There was no specific activity level or dietary  restrictions.   POSTHOSPITAL CARE PLAN:  The patient will see Florencia Reasons therapist at  [facility] on June 01, 2008 at  1  o'clock p.m.   DISCHARGE MEDICATIONS:  1. Maxzide 37.5/25 mg daily.  2. Celexa 20 mg daily.  3. Ambien 10 mg at bedtime if needed for sleep.      Jasmine Pang, M.D.  Electronically Signed     BHS/MEDQ  D:  05/20/2008  T:  05/21/2008  Job:  [id number]

## 2010-07-31 NOTE — Op Note (Signed)
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   PROCEDURE:  C4-5 and C5-6 anterior cervical diskectomy and arthrodesis  with allograft and tethered cervical plating.   SURGEON:  Hewitt Shorts, M.D.   ASSISTANT:  Jerald Kief, NP and Danae Orleans. Venetia Maxon, M.D.   ANESTHESIA:  General endotracheal.   INDICATIONS:  Patient is a 50 year old woman who presented with  myelopathy.  She was found to have significant disk herniation,  spondylosis and resulting stenosis with myopathic changes within the  spinal cord at the C4-5 and C5-6 levels.  Decision was made to proceed  with 2-level anterior cervical diskectomy and arthrodesis.   PROCEDURE:  Patient was brought to the operating room and placed under  general endotracheal anesthesia.  Patient was placed in 10 pounds of  Holter traction.  The neck was prepped with Betadine and soap solution  and draped in a sterile fashion.  A horizontal incision was made in the  left side of the neck.  The line of the incision was infiltrated with  local anesthetic with epinephrine.  Dissection was carried down through  the subcutaneous tissue and platysma.  Bipolar cautery was used to  maintain hemostasis.  Dissection was carried out through an avascular  plane leaving the sternocleidomastoid, parotid artery and jugular vein  laterally and the trachea and esophagus medially.  The ventral aspect of  the vertebral column was  identified and a localizing x-ray taken.  The  C4-5 and C5-6 intervertebral spaces were identified.  Diskectomy was  begun at each level with incision of the annulus and continued with  microcurettes and pituitary rongeurs.  Anterior osteophytic overgrowth  was removed using the osteophyte removal tool.  The microscope was  draped and brought into the field to provide additional magnification,  illumination, and visualization.  The remainder of the decompression was  performed using microdissection and microsurgical technique.  The  cartilaginous end plates of the vertebral bodies were removed using  microcurettes, along with the XMax drill and then posterior osteophyte  overgrowth was removed using the XMax drill along with the 2 mm Kerrison  punch with a thin flip plate.  There was significant posterior  osteophytic overgrowth with calcified disk herniation.  This was  carefully removed and we were able to decompress the spinal canal and  thecal sac.  The posterior and longitudinal ligaments were removed at  each level.  Then decompression of the neural foramen was performed  bilaterally at each level.  Once decompression was completed, hemostasis  was established with the use of Gelfoam soaked in thrombin.  Then we  measured the height of the intervertebral disk space and selected two 6-  mm implants.  Each was hydrated in saline solution and then positioned  in the intervertebral disk space and countersunk.  We then selected a 29-  mm tethered circle plate.  It was positioned over the fusion counterpart  and secured to the vertebra with 4 x 14-mm variable angled screws,  placing a pair of screws at C4, another pair at C6 and a single screw at  C5.  Each screw hole was drilled and tapped and the screws were placed  in alternating fashion.  Once all 5 screws were in place, final  tightening was performed.  Then the wound was irrigated with Bacitracin  solution, checked for hemostasis,  which was established and confirmed.  An x-ray was taken.  Unfortunately, visualization was limited to just  the body of C4.  The screw appeared in good position there.  Then, under  direct visualization, it appeared the grafts, plate and screws were all  in good position.  Then we proceeded with closure once hemostasis was  reconfirmed.  The platysma was closed with interrupted inverted 2-0  undyed Vicryl sutures.  The subcutaneous and subcuticular layer were  closed with interrupted inverted 3-0 undyed Vicryl sutures and the skin  were approximated with Dermabond.  Procedure was tolerated well.  The  estimated blood loss was less than 50 mL.  Sponge and needle count were  correct.  Following surgery the patient had been taken out of the  cervical traction and was placed in a soft cervical collar, was reversed  from the anesthetic, estimated, and transferred to the recovery room for  further care where she was noted to be moving all 4 extremities on  command.      Hewitt Shorts, M.D.  Electronically Signed     RWN/MEDQ  D:  12/22/2006  T:  12/22/2006  Job:  [id number]   cc:   Hewitt Shorts, M.D.

## 2010-08-03 NOTE — H&P (Signed)
[facility]  Patient:    [name], [name]                      MRN: [id number] Adm. Date:  [id number] Attending:  Otilio Moore Dictator:   [name], N.P.                         History and Physical  CHIEF COMPLAINT:  [name] is a 50 year old white separated female, admitted on a voluntary basis on October 04, 1999, on a referral from Dr. Buel Moore office for depression with suicidal ideation, with a plan.  HISTORY OF PRESENT ILLNESS:  The patient has been depressed for two to three months and has been intensified over this week.  She states she could not work on Tuesday. She was crying a lot and actually she planned to overdose on two bottles of Tylenol.  When she planned to overdose, apparently one of her daughters walked into the room, and she did not take the overdose, and also the daughter was not aware that she had planned to do that.  Her sleep has been poor for one month, averaging two to three hours a night.  Her appetite is decreased with a weight loss of 6 pounds.  The patient feels hopeless.  She continues to have some suicidal ideation, no energy, no interest in things. The stress includes nonsupport mainly from her second husband for her children.  She is in severe financial difficulty as a result of this.  Also she states that there are some problems at church, that she feels someone was gossiping about her daughter.  She thinks people talk about her, but it seems like it is not necessarily paranoia.  It is more in the sense that this patient has low self-esteem.  No hallucinations.  No delusions.  The patient is a single mom with three children, which makes it very difficult for her. She does have a relationship with a boyfriend x 2 years.  He is older and has lots of medical problems, and she worries about him.  PAST PSYCHIATRIC HISTORY:  The patient states this past Tuesday night she was going to take two bottles of Tylenol;  however, the daughter walked in, so the patient stopped.  No history of inpatient or outpatient psychiatric treatment.  No history of being on any antidepressants.  PAST MEDICAL HISTORY:  The patient sees Dr. Juluis Moore at [facility].  She had a physical done two weeks ago which was negative.  Medical problems:  None. PAST SURGICAL HISTORY:  She did have a partial hysterectomy in 2000, carpal tunnel surgery in both hands, and right knee surgery for ligament repair.  CURRENT MEDICATIONS:  Tylenol Extra Strength two q.8h. when working.  Usually does not take it every day.  ALLERGIES:  No known drug allergies.  SOCIAL HISTORY:  The patient has been married twice.  The first marriage lasted for two years.  She has a 6 year old daughter from this marriage. This husband occasionally pays child support.  She has been married for the second time and has been separated for four years.  She has 80-year-old twins from this marriage.  Apparently she is attempting to get child support from this husband, and he rarely gives child support, and she is working with DSS to get it; however, she has not been successful in getting any support.  She has two brothers and one sister.  Her parents are living.  The patient lives in [place] with her children.  She works at [employer] in [place], and has been there four years.  She completed 12 grades in high school and three years of college, majoring in nursing. She does plan to go back to school and major in phlebotomy.  FAMILY HISTORY:  None known.  ALCOHOL AND DRUG HISTORY:  Denies alcohol use.  Denies substance abuse. Denies any history of smoking.  REVIEW OF SYSTEMS:  CARDIAC:  Denies any problems.  No hypertension. PULMONARY:  Shortness of breath at times, could be related to weight.  No history of lung disease or COPD.  NEURO:  Complains of a headache.  She states she has migraines occasionally and takes Tylenol or Motrin  as needed for the headaches.  She did have an MRI and CAT scan three years ago which was negative.  HEMATOLOGY:  No problems, no history of anemia or bleeding disorder, or sickle cell.  ENDOCRINE:  The patient denies any problems.  No thyroid problems.  No diabetic problems.  GI:  No problems noted.  No diarrhea, no constipation, no recent change in bowel habits.  GENITOURINARY: The patient denies any problems.  No urinary frequency, urgency, incontinence, hematuria, or nocturia.  MUSCULOSKELETAL:  The patient has had knee surgery in the past on her right knee.  She has arthritis in both knees.  ENT:  The patient has blurred vision sometimes, otherwise on problem.  REPRODUCTIVE: The patient had a partial hysterectomy last year, due to fibroids.  PREVENTIVE CARE:  The patient had a complete physical examination done by her medical doctor, Dr. Arelia Moore two weeks ago, along with laboratory work.  SKIN/MUCOSA:  The patient denies any problems.  No ulcer, erythema, or ecchymosis.  She does have some edema when her legs sometimes swell at the knees or below.  PAIN: Denies any pain other than headaches.  Sleep has been decreased recently. NUTRITION:  There has been some weight loss of 6 pounds over the past few weeks.  PHYSICAL EXAMINATION:  Is pending.  VITAL SIGNS:  Temperature 98.6 degrees, pulse 73, respirations 24, blood pressure 122/78.  Height 5 feet 2 inches, weight 336 pounds.  MENTAL STATUS EXAMINATION:  A casually-dressed African-American female who is overweight.  She is very cooperative and pleasant.  Speech low, but relevant. Mood is sad, anxious.  Affect depressed, positive for suicidal ideation. Thought processes are logical and coherent, without evidence of psychosis. Cognitive:  Alert and oriented.  Cognitive function intact.  CURRENT DIAGNOSES: AXIS I.    Major depression, recurrent, with suicidal ideation. AXIS II.   Deferred. AXIS III.  None. AXIS IV.   Severe, related  to problems with social environment, economic            problems. AXIS V.    Current global assessment of functioning is 35.  Highest in the            past year is 70.   CURRENT TREATMENT PLAN AND RECOMMENDATIONS:  Voluntary admission to [facility].  Our goal will be to maintain her safety, check every 15 minutes.  The patient agrees to contract for safety.  She slept well intermittently last night, so will increase her Seroquel to 50 mg q.h.s. p.o. She did say it was the best sleep she has gotten in some time.  Since the patient is overweight and lacks a lot of energy, we will go ahead and try Wellbutrin 100 mg  XR p.o. q.a.m.  We will give her a dose this morning stat. Seroquel 25 mg q.4-6h. p.r.n. anxiety.  Will give her Extra Strength two tablets q.4-6h. p.r.n. headache, and have the patient attend her groups as much as possible. DD:  10/05/99 TD:  10/05/99 Job: [id number]

## 2010-08-03 NOTE — Op Note (Signed)
NAMESPARKLE, AUBE NO.:  [id number]   MEDICAL RECORD NO.:  [id number]          PATIENT TYPE:  AMB   LOCATION:  DAY                           FACILITY:  [facility]   PHYSICIAN:  Dalia Heading, M.D.  DATE OF BIRTH:  1961/01/22   DATE OF PROCEDURE:  01/10/2006  DATE OF DISCHARGE:  01/10/2006                                 OPERATIVE REPORT   PREOPERATIVE DIAGNOSIS:  Chronic cholecystitis.   POSTOPERATIVE DIAGNOSIS:  Chronic cholecystitis.   PROCEDURE:  Laparoscopic cholecystectomy.   SURGEON:  Dr. Dalia Heading   ASSISTANT:  Dr. Bernerd Limbo. Destefano.   ANESTHESIA:  General endotracheal.   INDICATIONS:  The patient is a 50 year old black female who presents with  biliary colic secondary to chronic cholecystitis.  The risks and benefits of  the procedure, including bleeding, infection, hepatobiliary injury and the  possibly of an open procedure were fully explained to the patient who gave  an informed consent.   PROCEDURE NOTE:  The patient was placed in the supine position.  After  induction of general endotracheal anesthesia, the abdomen was prepped and  draped using the usual sterile technique with Betadine.  Surgical site  confirmation was performed.   A supraumbilical incision was made down to the fascia.  The Veress needle  was introduced into the abdominal cavity and confirmation of placement was  done using the saline drop test.  The abdomen was then insufflated to 16  mmHg pressure.  An 11 mm trocar was introduced into the abdominal cavity  under direct visualization without difficulty.  The patient was placed in  the reverse Trendelenburg position.  An additional 11 mm trocar was placed  in the epigastric region and a 5 mm trocar was placed in the right upper  quadrant and right flank regions.  the liver was inspected and noted be  within normal limits.  The gallbladder was retracted superiorly and  laterally.  The dissection was begun around the  infundibulum of the  gallbladder.  The cystic duct was first identified.  Its juncture to the  infundibulum was fully identified.  Endo clips placed proximally and  distally on the cystic duct and the cystic duct was divided.  This was  likewise done on the cystic artery.  The gallbladder was then freed away  from the gallbladder fossa using Bovie electrocautery.  The gallbladder was  delivered through the epigastric trocar site using the EndoCatch bag.  The  gallbladder fossa was inspected and no abnormal bleeding or bile leakage was  noted.  Surgicel was placed in the gallbladder fossa.  All fluid and air  were then evacuated from the abdominal cavity prior to the removal of the  trocars.   All wounds were irrigated with normal saline.  All wounds were injected with  0.5% Sensorcaine.  The skin was closed using clips.  Betadine ointment and  dry sterile dressings were applied.  All __________ and needle counts were  correct at the end of the procedure.   The patient was extubated in the operating room and went back to the  recovery  room awake and in stable condition.  Complications:  None.   SPECIMEN:  Gallbladder.   ESTIMATED BLOOD LOSS:  Was minimal.  my office to the chart to Dr. [name] thank you      Dalia Heading, M.D.  Electronically Signed     MAJ/MEDQ  D:  01/10/2006  T:  01/12/2006  Job:  [id number]   cc:   Milus Mallick. Lodema Hong, M.D.  Fax: [phone]

## 2010-08-03 NOTE — H&P (Signed)
NAME:  [name]                          ACCOUNT NO.:  [id number]   MEDICAL RECORD NO.:  [id number]                   PATIENT TYPE:  INP   LOCATION:  5532                                 FACILITY:  [facility]   PHYSICIAN:  Marlan Palau, M.D.               DATE OF BIRTH:  [birth year]   DATE OF ADMISSION:  12/23/2002  DATE OF DISCHARGE:                                HISTORY & PHYSICAL   HISTORY OF PRESENT ILLNESS:  [name] is a 50 year old right-handed,  morbidly obese, black female who is followed by Dr. Clarisse Gouge for headache.  This patient comes to the emergency room for an evaluation of right facial  weakness that developed yesterday and persisted until today associated with  a significant left frontal temporal headache.  Dr. Clarisse Gouge, however, does not  go to the emergency room, and therefore, [facility] was  called for further evaluation.  The patient reports some numb, tingly  sensations that she perceives on the right face, denies any numbness or  weakness of the arms or legs.  She does note some dizziness, denies blackout  episodes, speech problems, swallowing problems, visual field disturbance,  double vision.  The patient claims that she had a similar type episode  affecting the face in June 2004 associated with some weakness in the right  arm.  The patient claims she underwent an MRI scan at that point and was  told that nothing was wrong.  The patient claims that the right arm weakness  has persisted.  The patient has been placed on Topamax but is only taking 25  mg twice a day and is taking this only on an as needed basis, not on a  scheduled basis.  The patient comes through the emergency room for further  evaluation.  The patient will be admitted for evaluation of possible  cerebrovascular infarction or complicated migraine.   PAST MEDICAL HISTORY:  1. Significant for morbid obesity.  2. History of migraine.  3. New onset of right facial  droop.  4. Status post partial hysterectomy.  5. History of recent partial thyroidectomy for a nodule that is benign.  The     patient is only on Topamax 25 mg b.i.d. p.r.n.   ALLERGIES:  She has no known allergies.   HABITS:  She does not smoke or drink.   SOCIAL HISTORY:  This patient is divorced, lives in the [place] area.  She has three children who are alive and well.   FAMILY HISTORY:  Mother is alive, has right below knee amputation, history  of diabetes and hypertension.  Father is alive with diabetes, emphysema.  The patient has two brothers, one sister.  One brother has neck problems.   REVIEW OF SYSTEMS:  Notable for some troubles with no fevers or chills.  The  patient denies any significant neck pain or stiffness, denies shortness of  breath, chest pains, does have some nausea associated with the headache.  She denies any blackout episodes, problems controlling the bowels or the  bladder.   PHYSICAL EXAMINATION:  VITAL SIGNS:  Blood pressure is 125/67, heart rate  70, respiratory rate 22, temperature afebrile.  GENERAL:  This patient is a morbidly obese black female who is alert and  cooperative at the time of the examination.  HEENT:  Head is atraumatic.  Eyes:  Pupils are equal, round, and reactive to  light.  Discs are flat bilaterally.  NECK:  Supple.  No carotid bruits noted.  RESPIRATORY:  Examination is clear.  CARDIOVASCULAR:  Examination reveals distant heart sounds, no obvious  murmurs or rubs noted.  ABDOMEN:  Markedly obese.  No obvious organomegaly or tenderness is noted.  EXTREMITIES:  Reveal 1-2+ edema at the ankles bilaterally.  NEUROLOGIC:  Cranial nerves as above.  Facial symmetry is not present.  The  patient has pulled the right face over to the left.  More symmetry is noted  with the smile.  The patient protrudes the tongue in the midline, has good  strength otherwise to eye closure, head turning, and shoulder shrug   bilaterally.  Speech is well-enunciated, not aphasic, not dysarthric.  Visual fields are full.  Some engaged nystagmus is seen bilaterally.  The  patient has good strength in all four extremities.  Good symmetric motor  tone is noted throughout.  Sensory testing is intact to pinprick, soft  touch, __________ sensation throughout.  The patient has good finger-nose-  finger, toe-to-finger bilaterally.  Gait was not tested.  The patient has  symmetric but depressed reflex throughout.  Toes neutral bilaterally.   LABORATORY DATA:  Notable for white count of 7.6, hemoglobin 11.9,  hematocrit of 37.5, MCV of 75.9, platelets of 396.  INR 1.1.  Sodium 138,  potassium 3.8, chloride 104, CO2 29, glucose 94, BUN of 9, creatinine 0.6,  calcium 9, total protein 7.1, albumin 3.3.  AST 12, ALT 13, alkaline  phosphatase 69, total bilirubin 0.5.   IMPRESSION:  1. Migraine headache.  2. Right facial droop.  3. Morbid obesity.   This patient has presented with headache and some right facial droop.  The  type of facial droop appears that it may be functional in nature and is no  evidence of other abnormalities on clinical neurologic examination.  At any  rate, this patient should be evaluated for possible complicated migraine or  cerebrovascular infarction.  The patient reports a similar problem  previously.   PLAN:  1. Will admit this patient for observation.  2. MRI of the brain.  3. MI angiogram of intracranial and extracranial vessels.  4.     Aspirin therapy for now.  5. Analgesics for pain.  Will follow the patient's clinical course while in     house.  If MRI scan is unremarkable the patient will be discharged to     home.                                                   Marlan Palau, M.D.    CKW/MEDQ  D:  12/23/2002  T:  12/23/2002  Job:  [id number]   cc:   Gaynelle Cage, MD  [phone] [address]  [place]  [place] [address]  Fax: [phone]

## 2010-08-03 NOTE — Discharge Summary (Signed)
NAME:  [name]                          ACCOUNT NO.:  [id number]   MEDICAL RECORD NO.:  [id number]                   PATIENT TYPE:  INP   LOCATION:  5532                                 FACILITY:  [facility]   PHYSICIAN:  Marlan Palau, M.D.               DATE OF BIRTH:  1960-04-08   DATE OF ADMISSION:  12/23/2002  DATE OF DISCHARGE:  12/27/2002                                 DISCHARGE SUMMARY   ADMISSION DIAGNOSES:  1. History of migraine headache.  2. Right facial droop, rule out cerebrovascular infarction and complicated     migraine.  3. Morbid obesity.   DISCHARGE DIAGNOSES:  1. Migraine headache.  2. Possible neurologic migraine.  3. Morbid obesity.   HISTORY OF PRESENT ILLNESS:  [name] is a 50 year old black female born  on 07/28/1960, with a history of headache problems followed by Dr.  Douglass Rivers.  The patient has been admitted through [facility] on December 23, 2002, with right facial droop.  Headache had been going on for a couple  of days.  The patient denies any weakness or numbness of the arms or legs.  The patient had been ambulating normally.  The patient does have some nausea  and vomiting with the headache off and on.  The patient is followed by Dr.  Douglass Rivers, but Dr. Douglass Rivers has no hospital privileges and [facility] were called for an evaluation.   PAST MEDICAL HISTORY:  1. History of migraine headache.  2. New onset right facial droop, rule out complicated migraine.  3. Morbid obesity.  4. Partial thyroidectomy for nodule.  5. Status post hysterectomy.   MEDICATIONS PRIOR TO ADMISSION:  Topamax 25 mg b.i.d. p.r.n.   ALLERGIES:  The patient has no known allergies.   SOCIAL HISTORY:  She does not smoke or drink.   PROCEDURES DURING THIS ADMISSION:  1. CT of the brain.  2. MRI of the brain.   There were no complications to the above procedures noted.   LABORATORY VALUES:  Notable for a white count of 7.6,  hemoglobin of 11.9,  hematocrit of 37.5, MCV of 75.9, and platelets of 396.  Sedimentation rate  of 59.  INR of 1.1.  Sodium of 138, potassium 3.8, chloride of 104, CO2 of  29, glucose of 94, BUN of 9, creatinine 0.6, calcium 9.0, total protein 7.1,  albumin of 3.3, AST of 12, ALT of 13, ALP 69, total bilirubin 0.5.  RPR  nonreactive.  Lyme's titer negative.  ANA is pending.  Again, CT of the  brain was unremarkable and normal.  Chest x-ray shows low lung volumes and  no active disease.  EKG reveals normal sinus rhythm, normal EKG, and heart  rate of 62.   HOSPITAL COURSE:  The patient was admitted to [facility] for  evaluation of the focal deficits.  The patient underwent an  MRI scan of the  brain at [facility] showing no structural lesions, tumor, or stroke.  The parotid  glands were slightly enlarged bilaterally.  ACE angiotensin converting  enzyme level was normal.  The patient was treated with DHE45 protocol.  She  was treated with aspirin.  The patient had some improvement with the  headache.  She has had some nausea on and off.  The plan is for the patient  to be discharged on Topamax 50 mg twice a day and aspirin 81 mg a day.  A  mild facial droop is still noted.  The patient otherwise is fully ambulatory  and moves all four extremities well.  Full visual fields.  Speech is normal.  The patient will follow up with Dr. Douglass Rivers as needed.                                                Marlan Palau, M.D.    CKW/MEDQ  D:  12/27/2002  T:  12/27/2002  Job:  [id number]   cc:   [facility]  [address]  [address]

## 2010-08-03 NOTE — Discharge Summary (Signed)
[facility]  Patient:    SIAH, KANNAN                      MRN: [id number] Adm. Date:  [id number] Disc. Date: [id number] Attending:  Otilio Saber                           Discharge Summary  ADMISSION DIAGNOSES: Axis I:    Major depression, recurrent, with suicidal ideas. Axis II:   Deferred. Axis III:  No diagnosis. Axis IV:   Severe, related to social environment. Axis V:    Global assessment of functioning of 35 on admission and highest 70.  DISCHARGE DIAGNOSES: Axis I:    Major depression, recurrent, with suicidal ideas. Axis II:   Deferred. Axis III:  No diagnosis. Axis IV:   Severe. Axis V:    Global assessment of functioning of 35 on admission and 80 on            discharge.  BRIEF HISTORY:  The patient is a 50 year old white married female who is currently separated for four years.  She has three children; she has twins 9 and a 38 year old.  She has been under considerable financial pressures.  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  She is buying a house but is financially stressed.  She came off bankruptcy two months ago but in spite of that, she continues to experience considerable financial difficulties.  Patient, prior to admission, was planning to overdose when her daughter interrupted her suicidal attempt.  She then decided to present for hospitalization.  She endorsed symptoms of anorexia, anhedonia, suicidal ideas, lethargy and insomnia.  PERTINENT PHYSICAL AND LABORATORY DATA:  Physical examination on admission was relevant for obesity.  Admission lab work included a normal urinalysis, a negative drug screen. Comprehensive metabolic profile showed normal values.  CBC showed mild anemia with a hemoglobin of 11.6 and a hematocrit of 38.5.  TSH was within the normal range.  COURSE IN THE HOSPITAL:  Patient was established on a combination of Wellbutrin SR 100 mg q.12h. and Seroquel 100 mg h.s. with  considerable benefit.  This morning, she notes that she has slept overnight and that she woke up without any sedation.  Currently, her thinking is tight and goal-directed, her suicidal ideas have resolved, she denies homicidal aspiration and reports that she has found this hospitalization quite useful.  MEDICATION AND FOLLOWUP:  The patient is discharged with prescriptions for Seroquel 100 mg h.s. and Wellbutrin SR 100 mg q.12h.  She reports that she is going to be following with Dr. Charlies Silvers in followup.  She is advised not to drink and not to get pregnant on this medication. DD:  10/08/99 TD:  10/10/99 Job: [id number]

## 2010-08-03 NOTE — H&P (Signed)
[facility]  Patient:    [name], [name]                      MRN: [id number] Adm. Date:  [id number] Attending:  Otilio Saber Dictator:   Johnella Moloney, N.P.                         History and Physical  REVIEW OF SYSTEMS/VITALS SIGNS:  Recorded on the first dictation.  PHYSICAL EXAMINATION:  GENERAL APPEARANCE:  Patient is a 50 year old African-American female sitting on the exam table in no acute distress.  She is well-developed and obese in stature and appears her stated age.  HEAD:  Normocephalic, atraumatic, can raise eyebrows.  EYES:  Pupils pupils are equal, round and reactive to light.  Extraocular movements intact bilaterally.  Direct and consentual.  Funduscopic exam within normal limits.  ENT/MOUTH:  External air canals patent.  Tympanic membranes intact with normal tone to light without injection.  Nostrils patent bilaterally.  No sinus tenderness.  Mouth mucosa moist.  Good dentition.  No lesions seen or palpated on tongue.  Tongue protrudes to midline without tremor.  She can clench her teeth and puff out cheeks.  No pharyngeal or tonsillar hyperemia exudate noted.  NECK:  Supple with full range of motion.  No jugular venous distension.  No lymphadenopathy.  Thyroid is nonpalpable, nontender, not enlarged.  No cervical lymphadenopathy.  RESPIRATORY:  Clear to auscultation without adventitious sounds.  Her thorax is symmetrical with good expansion.  No cough.  CARDIOVASCULAR:  Regular rate and rhythm without murmurs.  No clicks, gallops, rubs or extra heart sounds heard.  Carotid pulses equal and adequate bilaterally.  No carotid bruits auscultated.  She does have edema in her lower extremities.  No varicosities noted however.  CHEST:  Deferred.  ABDOMEN:  Spects revealed no protrusion.  Soft, nontender abdomen.  No masses, organomegaly or rebound tenderness.  Active bowel sounds in all four quadrants.  No CVA  tenderness.  LYMPH:  No lymphadenopathy.  MUSCULAR:  No joint deformity noted.  She has got a normal gait.  Muscle strength and tone equal bilaterally.  Skin is warm and dry.  She has good turgor.  NEUROLOGIC:  Oriented x 3.  Cranial nerves grossly intact.  Deep tendon reflexes 2+ equal and adequate in upper and lower extremities.  Good grip strength bilaterally with no involuntary movement.  Cerebellar function intact with finger to finger and normal alternating movements.  Normal gait.  She was unable to do heel to shin due to her obesity.  Romberg was negative. DD:  10/05/99 TD:  10/07/99 Job: [id number]

## 2010-08-03 NOTE — Op Note (Signed)
NAME:  [name], [name] NO.:  [id number]   MEDICAL RECORD NO.:  [id number]                   PATIENT TYPE:  OIB   LOCATION:  5714                                 FACILITY:  [facility]   PHYSICIAN:  Gita Kudo, M.D.              DATE OF BIRTH:  [birth year]   DATE OF PROCEDURE:  10/19/2002  DATE OF DISCHARGE:                                 OPERATIVE REPORT   PREOPERATIVE DIAGNOSIS:  Left thyroid nodule.   POSTOPERATIVE DIAGNOSIS:  Left thyroid nodule, benign on frozen section.   OPERATION PERFORMED:  Left thyroid lobectomy with frozen section.   SURGEON:  Gita Kudo, M.D.   ASSISTANT:  Anselm Pancoast. Zachery Dakins, M.D.   ANESTHESIA:  General endotracheal.   INDICATIONS FOR PROCEDURE:  The patient is a 50 year old female with  asymptomatic mass in her neck.  It was found on CT scan while undergoing a  work-up for other causes.  She is medically stable, comes in for excision.   OPERATIVE FINDINGS:  The patient's left lobe was totally replaced with a  large dominant nodule.  It was about 4 cm in size and looked as though it  had a cystic component.  Frozen section was benign.  Both parathyroids and  recurrent nerve identified and not injured.  The left side looked and felt  totally normal and was small.   DESCRIPTION OF PROCEDURE:  Under satisfactory general endotracheal  anesthesia, the patient was positioned and prepped and draped in a standard  fashion.  A transverse neck incision was made and carried down through the  platysma with flaps being elevated and then retracted away.  The midline was  opened and retracted.  Bleeders were coagulated or tied with 3-0 silk.  The  right lobe was exposed and was removed.  The inferior vein divided between  metal clips.  The superior pole taken down between ties and clips of 2-0  silks.  Then the gland was rolled medially and carefully, the middle vein  identified, divided between clips and branches of the  inferior artery  clipped close to the gland and divided.  Then the gland was dissected off  the trachea and the isthmus divided.  This specimen was sent.  The isthmus  was controlled with 4-0 Vicryl suture and cautery.  The wound was inspected,  hemostatic and packed lightly.  Then the right side was approached and the  strap muscles retracted and the gland carefully visualized and palpated and  looked totally normal and I elected not to proceed. While waiting for the  frozen section, the wound was lavaged with saline, made dry and then closed  in layers with 4-0 Vicryl for the midline and platysma.  Skin edges approximated with staples and Steri-Strips after the report came  back from pathology.  A sterile absorbent dressing was applied and the  patient then went to the recovery room from the  operating room in good  condition without complication.                                                Gita Kudo, M.D.    MRL/MEDQ  D:  10/19/2002  T:  10/19/2002  Job:  [id number]   cc:   Gaynelle Cage, MD  [phone] [address]  [place]  [place] [address]  Fax: [phone]

## 2010-08-03 NOTE — Discharge Summary (Signed)
NAME:  [name], [name]                         ACCOUNT NO.:  [id number]   MEDICAL RECORD NO.:  [id number]                   PATIENT TYPE:  INP   LOCATION:  4714                                 FACILITY:  [facility]   PHYSICIAN:  Noelle C. Merilynn Finland, M.D.           DATE OF BIRTH:  Aug 14, 1960   DATE OF ADMISSION:  08/27/2002  DATE OF DISCHARGE:  08/28/2002                                 DISCHARGE SUMMARY   PRIMARY CARE PHYSICIAN:  [facility].   DISCHARGE DIAGNOSES:  1. Chest pain, rule out myocardial infarction.  2. Facial numbness.  3. Thyroid mass.   DISCHARGE MEDICATIONS:  None.   DISCHARGE INSTRUCTIONS:  1. Tylenol or Motrin as needed for pain.  2. The patient was excused from work for the two days following discharge.  3. Low fat diet.   FOLLOWUP:  She was instructed to see her regular doctor at [facility] within one to two weeks following discharge.   BRIEF HOSPITAL COURSE:  [name] is a 50 year old African-American female  who presented to the [facility] Emergency Room on the day of admission with  the abrupt onset of 10/10 left-sided chest pain, left facial numbness, and  the feeling of dyspnea.  Her past history was significant for anxiety plus  panic attacks and a history of migraine headaches associated with left  facial numbness.   1. Chest pain:  The patient had no significant cardiac risk factors.  She     ruled out for MI by enzymes and had unremarkable EKGs during     hospitalization.  She will need outpatient risk stratification.  2. Left facial numbness:  This is associated with an obvious left facial     droop in the lip area.  On the day of discharge, her symptoms were much     improved.  An MRI/MRA on August 28, 2002, showed a normal MRI of the brain,     normal intracranial MR angiography, and the MR angiography of the neck     vessels showed only a small plaque at the proximal ICA bulb on the right,     but  this did not result in measurable stenosis and was not a large     lesion, but was felt to be a potential source of embolic disease.  Given     this overall unremarkable study, she was felt to likely be suffering from     an autonomic dysfunction or an atypical migraine.  This is to be followed     as an outpatient.  3. Thyroid mass:  A chest CT with IV contrast on admission did show a 2.3-cm     nodule in the left lobe of the thyroid.  TSH during hospitalization was     normal at 4.447.  This should be followed up as an outpatient with a     fine needle aspirate or  ultrasound to further evaluate the area.  An     approximately 2 cm thyroid mass was palpable on the left side on physical     exam.   Instructions for the primary M.D.:  Please note issues to be followed up on  as an outpatient.                                                 Noelle C. Merilynn Finland, M.D.    NCR/MEDQ  D:  11/25/2002  T:  11/26/2002  Job:  [id number]   cc:   [facility]

## 2010-08-03 NOTE — H&P (Signed)
[name], DINIUS NO.:  [id number]   MEDICAL RECORD NO.:  [id number]          PATIENT TYPE:  AMB   LOCATION:  DAY                           FACILITY:  [facility]   PHYSICIAN:  Dalia Heading, M.D.  DATE OF BIRTH:  10-23-1960   DATE OF ADMISSION:  01/10/2006  DATE OF DISCHARGE:  LH                                HISTORY & PHYSICAL   CHIEF COMPLAINT:  Chronic cholecystitis.   HISTORY OF PRESENT ILLNESS:  The patient is a 50 year old black female who  is referred for evaluation and treatment of biliary colic secondary to  chronic cholecystitis.  She has been having right upper quadrant abdominal  pain, nausea, and bloating for many months.  She does have fatty food  intolerance.  No fever, chills, or jaundice have been noted.   PAST MEDICAL HISTORY:  Migraines.   PAST SURGICAL HISTORY:  Hand surgery, knee surgery, left thyroidectomy,  tonsillectomy, partial hysterectomy, two C-sections.   CURRENT MEDICATIONS:  Ibuprofen and Imitrex as needed.   ALLERGIES:  No known drug allergies.   REVIEW OF SYSTEMS:  Noncontributory.   PHYSICAL EXAMINATION:  GENERAL:  The patient is a mildly obese black female  in no acute distress.  HEENT: No scleral icterus.  LUNGS:  Clear to auscultation with equal breath sounds bilaterally.  HEART:  Regular rate and rhythm without S3, S4, or murmurs.  ABDOMEN:  Soft and nondistended.  She is tender in the right upper quadrant  to palpation.  No hepatosplenomegaly, masses, or hernias are identified.   HIDA scan reveals chronic cholecystitis with a low gallbladder ejection  fraction and reproducible symptoms.   IMPRESSION:  Chronic cholecystitis.   PLAN:  The patient is scheduled for a laparoscopic cholecystectomy on  January 10, 2006.  Risks and benefits of the procedure including bleeding,  infection, hepatobiliary injury, and the possibility of an open procedure  were fully explained to the patient, who gave informed  consent.      Dalia Heading, M.D.  Electronically Signed     MAJ/MEDQ  D:  01/09/2006  T:  01/10/2006  Job:  [id number]   cc:   [facility]  Fax: [phone]   Milus Mallick. Lodema Hong, M.D.  Fax: [phone]

## 2010-08-13 ENCOUNTER — Emergency Department (HOSPITAL_COMMUNITY)
Admission: EM | Admit: 2010-08-13 | Discharge: 2010-08-13 | Disposition: A | Discharge status: Home / Self Care | Payer: Self-pay | Attending: Emergency Medicine | Admitting: Emergency Medicine

## 2010-08-13 DIAGNOSIS — R110 Nausea: Secondary | ICD-10-CM | POA: Insufficient documentation

## 2010-08-13 DIAGNOSIS — G43909 Migraine, unspecified, not intractable, without status migrainosus: Secondary | ICD-10-CM | POA: Insufficient documentation

## 2010-08-13 DIAGNOSIS — R209 Unspecified disturbances of skin sensation: Secondary | ICD-10-CM | POA: Insufficient documentation

## 2010-11-12 ENCOUNTER — Ambulatory Visit: Payer: Medicaid Other | Admitting: Family Medicine

## 2010-12-27 LAB — CBC
HCT: 40.5
Hemoglobin: 13
MCHC: 32
MCV: 77.4 — ABNORMAL LOW
Platelets: 443 — ABNORMAL HIGH
RBC: 5.24 — ABNORMAL HIGH
RDW: 14.4 — ABNORMAL HIGH
WBC: 8.2

## 2010-12-27 LAB — BASIC METABOLIC PANEL
BUN: 12
CO2: 28
Calcium: 9.2
Chloride: 102
Creatinine, Ser: 0.72
GFR calc Af Amer: >60
GFR calc non Af Amer: >60
Glucose, Bld: 91
Potassium: 4.6
Sodium: 136

## 2011-02-22 ENCOUNTER — Telehealth: Payer: Self-pay

## 2011-02-22 ENCOUNTER — Encounter (HOSPITAL_COMMUNITY): Payer: Self-pay | Admitting: *Deleted

## 2011-02-22 ENCOUNTER — Emergency Department (HOSPITAL_COMMUNITY): Payer: Self-pay

## 2011-02-22 ENCOUNTER — Emergency Department (HOSPITAL_COMMUNITY)
Admission: EM | Admit: 2011-02-22 | Discharge: 2011-02-22 | Disposition: A | Discharge status: Home / Self Care | Payer: Self-pay | Attending: Emergency Medicine | Admitting: Emergency Medicine

## 2011-02-22 DIAGNOSIS — J069 Acute upper respiratory infection, unspecified: Secondary | ICD-10-CM

## 2011-02-22 DIAGNOSIS — R51 Headache: Secondary | ICD-10-CM | POA: Insufficient documentation

## 2011-02-22 DIAGNOSIS — R05 Cough: Secondary | ICD-10-CM | POA: Insufficient documentation

## 2011-02-22 DIAGNOSIS — J3489 Other specified disorders of nose and nasal sinuses: Secondary | ICD-10-CM | POA: Insufficient documentation

## 2011-02-22 DIAGNOSIS — R110 Nausea: Secondary | ICD-10-CM | POA: Insufficient documentation

## 2011-02-22 DIAGNOSIS — R062 Wheezing: Secondary | ICD-10-CM | POA: Insufficient documentation

## 2011-02-22 DIAGNOSIS — R0602 Shortness of breath: Secondary | ICD-10-CM | POA: Insufficient documentation

## 2011-02-22 DIAGNOSIS — R079 Chest pain, unspecified: Secondary | ICD-10-CM | POA: Insufficient documentation

## 2011-02-22 DIAGNOSIS — M549 Dorsalgia, unspecified: Secondary | ICD-10-CM | POA: Insufficient documentation

## 2011-02-22 DIAGNOSIS — R042 Hemoptysis: Secondary | ICD-10-CM | POA: Insufficient documentation

## 2011-02-22 DIAGNOSIS — R059 Cough, unspecified: Secondary | ICD-10-CM | POA: Insufficient documentation

## 2011-02-22 MED ORDER — ALBUTEROL SULFATE HFA 108 (90 BASE) MCG/ACT IN AERS: 2.0000 | INHALATION_SPRAY | Freq: Four times a day (QID) | RESPIRATORY_TRACT | Status: DC | PRN

## 2011-02-22 MED ORDER — ALBUTEROL SULFATE (5 MG/ML) 0.5% IN NEBU
5.0000 mg | INHALATION_SOLUTION | Freq: Once | RESPIRATORY_TRACT | Status: AC
  Administered 2011-02-22: 5 mg via RESPIRATORY_TRACT
  Filled 2011-02-22: qty 1

## 2011-02-22 MED ORDER — PREDNISONE 10 MG PO TABS: 20.0000 mg | ORAL_TABLET | Freq: Every day | ORAL | Status: DC

## 2011-02-22 MED ORDER — GUAIFENESIN-CODEINE 100-10 MG/5ML PO SYRP: 5.0000 mL | ORAL_SOLUTION | Freq: Three times a day (TID) | ORAL | Status: AC | PRN

## 2011-02-22 MED ORDER — PREDNISONE 20 MG PO TABS
60.0000 mg | ORAL_TABLET | Freq: Once | ORAL | Status: AC
  Administered 2011-02-22: 60 mg via ORAL
  Filled 2011-02-22: qty 3

## 2011-02-22 MED ORDER — GUAIFENESIN-CODEINE 100-10 MG/5ML PO SOLN
5.0000 mL | Freq: Once | ORAL | Status: AC
  Administered 2011-02-22: 5 mL via ORAL
  Filled 2011-02-22: qty 5

## 2011-02-22 MED ORDER — HYDROCODONE-ACETAMINOPHEN 5-325 MG PO TABS
1.0000 | ORAL_TABLET | Freq: Once | ORAL | Status: AC
  Administered 2011-02-22: 1 via ORAL
  Filled 2011-02-22: qty 1

## 2011-02-22 MED ORDER — HYDROCODONE-ACETAMINOPHEN 5-325 MG PO TABS: 1.0000 | ORAL_TABLET | ORAL | Status: DC | PRN

## 2011-02-22 NOTE — Telephone Encounter (Signed)
Noted, will not call in med at this time

## 2011-02-22 NOTE — Telephone Encounter (Signed)
Called in and states she has been coughing x 2 weeks, started out green phlegm now it is beginning to have some blood mixed in. Chest sore from all the coughing. Advised urgent care but patient wanted to see if anyone cancelled for Monday.

## 2011-02-22 NOTE — ED Notes (Signed)
Pt c/o chest pain and back pain x 2 weeks and states she began coughing up blood last night

## 2011-02-22 NOTE — ED Provider Notes (Signed)
History     CSN: [id number] Arrival date & time: 02/22/2011  1:14 PM   First MD Initiated Contact with Patient 02/22/11 1316      Chief Complaint  Patient presents with  . Chest Pain  . Back Pain  . Hemoptysis    (Consider location/radiation/quality/duration/timing/severity/associated sxs/prior treatment) HPI Comments: [name] is a 50 y.o. female who presents to the Emergency Department complaining of cough, headache, runny nose, headache, nausea for two weeks. Cough has become worse with chest discomfort and shortness of breath while coughing. Unable to sleep at night. Has taken robitussin without relief. Lying down makes the cough worse. Unable to get in to see PCP, Dr. Lodema Hong.   Patient is a 50 y.o. female presenting with chest pain and back pain.  Chest Pain    Back Pain  Associated symptoms include chest pain.    Past Medical History  Diagnosis Date  . Obesity   . Migraine headache   . Depression   . Arthritis     of the knees     Past Surgical History  Procedure Date  . Total abdominal hysterectomy 1995     non cancerous, partial has her ovaries   . Cholecytectomy 2007    approx. Jenkins   . Thyroidectomy, partial 2004    approx   . Arthroscopy right knee 1995    Dr. Cleophas Dunker   . Total knee arthroplasty May 24, 20111    Left knee , Dr. Dion Saucier   . Bilateral carpal tunnel release 1991  . 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 Diabetes Mother   . Diabetes Father     emphsema     History  Substance Use Topics  . Smoking status: Never Smoker   . Smokeless tobacco: Not on file  . Alcohol Use: No    OB History    Grav Para Term Preterm Abortions TAB SAB Ect Mult Living                  Review of Systems  Cardiovascular: Positive for chest pain.  Musculoskeletal: Positive for back pain.    Allergies  Latex  Home Medications   Current Outpatient Rx  Name  Route Sig Dispense Refill  . GUAIFENESIN 100 MG/5ML PO SOLN Oral Take 10 mLs by mouth every 4 (four) hours as needed. cough     . IBUPROFEN 800 MG PO TABS Oral Take 1 tablet (800 mg total) by mouth 2 (two) times daily. Take 1 tablet by mouth two times a day , please fill after June 09, 2009 60 tablet 3    BP 171/96  Pulse 78  Temp(Src) 98.8 F (37.1 C) (Oral)  Resp 18  SpO2 98%  Physical Exam  Nursing note and vitals reviewed. Constitutional: She is oriented to person, place, and time. She appears well-developed and well-nourished. No distress.  HENT:  Head: Normocephalic.  Right Ear: External ear normal.  Left Ear: External ear normal.  Nose: Nose normal.  Mouth/Throat: Oropharynx is clear and moist.  Eyes: EOM are normal. Pupils are equal, round, and reactive to light.  Neck: Normal range of motion.  Cardiovascular: Normal rate, normal heart sounds and intact distal pulses.   Pulmonary/Chest: Effort normal. She has wheezes.       Coarse cough, wheezing with expiration  Abdominal: Soft. Bowel sounds are normal.  Musculoskeletal: Normal range of motion.  Neurological: She is alert and  oriented to person, place, and time. She has normal reflexes.  Skin: Skin is warm and dry.    ED Course  Procedures (including critical care time)  Labs Reviewed - No data to display Dg Chest 2 View  02/22/2011  *RADIOLOGY REPORT*  Clinical Data: Cough and chest congestion  CHEST - 2 VIEW  Comparison:  06/09/2009  Findings:  The heart size and mediastinal contours are within normal limits.  Both lungs are clear.  The visualized skeletal structures are unremarkable.  IMPRESSION: No active cardiopulmonary disease.  Original Report Authenticated By: Danae Orleans, M.D.     No diagnosis found.    MDM  Patient with two week h/o viral URI symptoms. Xray negative for acute process. Received albuterol, prednisone, robitussin with some relief.Pt stable in ED with no significant deterioration in  condition.The patient appears reasonably screened and/or stabilized for discharge and I doubt any other medical condition or other [facility] requiring further screening, evaluation, or treatment in the ED at this time prior to discharge.  MDM Reviewed: nursing note and vitals Interpretation: x-ray          Nicoletta Dress. Colon Branch, MD 02/22/11 1506

## 2011-02-27 ENCOUNTER — Telehealth: Payer: Self-pay | Admitting: Family Medicine

## 2011-02-28 NOTE — Telephone Encounter (Signed)
Called patient no answer.

## 2011-02-28 NOTE — Telephone Encounter (Signed)
CALLED PATIENT LEFT MESSAGE.

## 2011-02-28 NOTE — Telephone Encounter (Signed)
appt made for tomorrow

## 2011-03-01 ENCOUNTER — Encounter: Payer: Self-pay | Admitting: Family Medicine

## 2011-03-01 ENCOUNTER — Ambulatory Visit (INDEPENDENT_AMBULATORY_CARE_PROVIDER_SITE_OTHER): Payer: Self-pay | Admitting: Family Medicine

## 2011-03-01 VITALS — BP 128/74 | HR 72 | Resp 20 | Ht 62.0 in | Wt 376.1 lb

## 2011-03-01 DIAGNOSIS — R7309 Other abnormal glucose: Secondary | ICD-10-CM

## 2011-03-01 DIAGNOSIS — J40 Bronchitis, not specified as acute or chronic: Secondary | ICD-10-CM

## 2011-03-01 DIAGNOSIS — N3090 Cystitis, unspecified without hematuria: Secondary | ICD-10-CM

## 2011-03-01 DIAGNOSIS — R7303 Prediabetes: Secondary | ICD-10-CM

## 2011-03-01 DIAGNOSIS — J209 Acute bronchitis, unspecified: Secondary | ICD-10-CM | POA: Insufficient documentation

## 2011-03-01 MED ORDER — FLUCONAZOLE 150 MG PO TABS: 150.0000 mg | ORAL_TABLET | Freq: Once | ORAL | Status: AC

## 2011-03-01 MED ORDER — SULFAMETHOXAZOLE-TRIMETHOPRIM 800-160 MG PO TABS: 1.0000 | ORAL_TABLET | Freq: Two times a day (BID) | ORAL | Status: AC

## 2011-03-01 NOTE — Assessment & Plan Note (Signed)
Deteriorated. Patient re-educated about  the importance of commitment to a  minimum of 150 minutes of exercise per week. The importance of healthy food choices with portion control discussed. Encouraged to start a food diary, count calories and to consider  joining a support group. Sample diet sheets offered. Goals set by the patient for the next several months.    

## 2011-03-01 NOTE — Progress Notes (Signed)
Subjective:     Patient ID: [name], female   DOB: 03/14/1961, 50 y.o.   MRN: [id number]  HPI Pt in for Ed f/u for chest congestion, cough and wheeze. States she is having thick yellow sputum, has completed prednisone, had no antibiotics. Re[ports excessive food intake with weight gain. C/o excessive urination and dry mouth.with mild dysuria for the past 2 days. Denies depression  Review of Systems    See HPI Denies recent fever or chills. Denies sinus pressure, nasal congestion, ear pain or sore throat.  Denies chest pains, palpitations and leg swelling Denies abdominal pain, nausea, vomiting,diarrhea or constipation.    Denies joint pain, swelling and limitation in mobility. Denies headaches, seizures, numbness, or tingling. Denies depression, anxiety or insomnia. Denies skin break down or rash.      Objective:   Physical Exam Patient alert and oriented and in no cardiopulmonary distress.  HEENT: No facial asymmetry, EOMI, no sinus tenderness,  oropharynx pink and moist.  Neck supple no adenopathy.  Chest: decreased air entry, scattered crackles , no wheezes  CVS: S1, S2 no murmurs, no S3.  ABD: Soft non tender. Bowel sounds normal.  Ext: No edema  MS: Adequate though reduced  ROM spine, shoulders, hips and knees.  Skin: Intact, no ulcerations or rash noted.  Psych: Good eye contact, normal affect. Memory intact not anxious or depressed appearing.  CNS: CN 2-12 intact, power, tone and sensation normal throughout.     Assessment:       Plan:

## 2011-03-01 NOTE — Patient Instructions (Signed)
F/u in 6.5 monthYou will receive a script for a 10 day supply of antibiotic , septra, pls fill for your cough.  Fluconazole may be filled if you develop vaginal yeast infection.  We will check your fingerstick blood sugar and urine today.  You do need HBa1C checked as soon as you are able , you are prediabetic, I suggest you call and register for free classes at [facility] on how to eat to avoid/reduce risk of becoming fully diabetic  A healthy diet is rich in fruit, vegetables and whole grains. Poultry fish, nuts and beans are a healthy choice for protein rather then red meat. A low sodium diet and drinking 64 ounces of water daily is generally recommended. Oils and sweet should be limited. Carbohydrates especially for those who are diabetic or overweight, should be limited to 30-45 gram per meal. It is important to eat on a regular schedule, at least 3 times daily. Snacks should be primarily fruits, vegetables or nuts.  It is important that you exercise regularly at least 30 minutes 5 times a week. If you develop chest pain, have severe difficulty breathing, or feel very tired, stop exercising immediately and seek medical attention

## 2011-03-01 NOTE — Assessment & Plan Note (Signed)
Antibiotic prescribed, pt to continue robitussin

## 2011-03-01 NOTE — Assessment & Plan Note (Signed)
ccua in office mildly abn will treat presumptively. No sugar in urine

## 2011-03-01 NOTE — Assessment & Plan Note (Signed)
HBA1C to be obtained, low carb diet encouraged and discussed, she is also encouraged to go to group class at [facility]

## 2011-03-14 ENCOUNTER — Other Ambulatory Visit: Payer: Self-pay

## 2011-03-14 ENCOUNTER — Telehealth: Payer: Self-pay | Admitting: Family Medicine

## 2011-03-14 MED ORDER — IBUPROFEN 800 MG PO TABS: 800.0000 mg | ORAL_TABLET | Freq: Two times a day (BID) | ORAL | Status: DC

## 2011-03-14 NOTE — Telephone Encounter (Signed)
Med refilled.

## 2011-04-15 ENCOUNTER — Telehealth: Payer: Self-pay | Admitting: Family Medicine

## 2011-05-01 ENCOUNTER — Other Ambulatory Visit: Payer: Self-pay | Admitting: Family Medicine

## 2011-05-01 DIAGNOSIS — Z139 Encounter for screening, unspecified: Secondary | ICD-10-CM

## 2011-05-06 ENCOUNTER — Ambulatory Visit (HOSPITAL_COMMUNITY)
Admission: RE | Admit: 2011-05-06 | Discharge: 2011-05-06 | Disposition: A | Discharge status: Home / Self Care | Payer: Self-pay | Source: Ambulatory Visit | Attending: Family Medicine | Admitting: Family Medicine

## 2011-05-06 DIAGNOSIS — Z139 Encounter for screening, unspecified: Secondary | ICD-10-CM

## 2011-07-03 ENCOUNTER — Observation Stay (HOSPITAL_COMMUNITY)
Admission: EM | Admit: 2011-07-03 | Discharge: 2011-07-04 | DRG: 103 | Disposition: A | Discharge status: Home / Self Care | Payer: Medicaid Other | Attending: Internal Medicine | Admitting: Internal Medicine

## 2011-07-03 ENCOUNTER — Emergency Department (HOSPITAL_COMMUNITY): Payer: Medicaid Other

## 2011-07-03 ENCOUNTER — Encounter (HOSPITAL_COMMUNITY): Payer: Self-pay | Admitting: *Deleted

## 2011-07-03 DIAGNOSIS — Z96659 Presence of unspecified artificial knee joint: Secondary | ICD-10-CM

## 2011-07-03 DIAGNOSIS — Z6841 Body Mass Index (BMI) 40.0 and over, adult: Secondary | ICD-10-CM

## 2011-07-03 DIAGNOSIS — R531 Weakness: Secondary | ICD-10-CM | POA: Diagnosis present

## 2011-07-03 DIAGNOSIS — R29898 Other symptoms and signs involving the musculoskeletal system: Secondary | ICD-10-CM | POA: Diagnosis present

## 2011-07-03 DIAGNOSIS — R001 Bradycardia, unspecified: Secondary | ICD-10-CM | POA: Diagnosis present

## 2011-07-03 DIAGNOSIS — I498 Other specified cardiac arrhythmias: Secondary | ICD-10-CM | POA: Diagnosis present

## 2011-07-03 DIAGNOSIS — H538 Other visual disturbances: Secondary | ICD-10-CM | POA: Diagnosis present

## 2011-07-03 DIAGNOSIS — D649 Anemia, unspecified: Secondary | ICD-10-CM | POA: Diagnosis present

## 2011-07-03 DIAGNOSIS — M1990 Unspecified osteoarthritis, unspecified site: Secondary | ICD-10-CM | POA: Diagnosis present

## 2011-07-03 DIAGNOSIS — I517 Cardiomegaly: Secondary | ICD-10-CM

## 2011-07-03 DIAGNOSIS — E039 Hypothyroidism, unspecified: Secondary | ICD-10-CM | POA: Diagnosis present

## 2011-07-03 DIAGNOSIS — R51 Headache: Secondary | ICD-10-CM | POA: Diagnosis present

## 2011-07-03 DIAGNOSIS — R7303 Prediabetes: Secondary | ICD-10-CM | POA: Diagnosis present

## 2011-07-03 DIAGNOSIS — I639 Cerebral infarction, unspecified: Secondary | ICD-10-CM

## 2011-07-03 DIAGNOSIS — R29810 Facial weakness: Secondary | ICD-10-CM | POA: Diagnosis present

## 2011-07-03 DIAGNOSIS — G510 Bell's palsy: Secondary | ICD-10-CM | POA: Diagnosis present

## 2011-07-03 DIAGNOSIS — R7309 Other abnormal glucose: Secondary | ICD-10-CM | POA: Diagnosis present

## 2011-07-03 DIAGNOSIS — R519 Headache, unspecified: Secondary | ICD-10-CM | POA: Diagnosis present

## 2011-07-03 DIAGNOSIS — G43109 Migraine with aura, not intractable, without status migrainosus: Principal | ICD-10-CM | POA: Diagnosis present

## 2011-07-03 HISTORY — DX: Hypothyroidism, unspecified: E03.9

## 2011-07-03 HISTORY — DX: Other chronic pain: M54.9

## 2011-07-03 HISTORY — DX: Morbid (severe) obesity due to excess calories: E66.01

## 2011-07-03 HISTORY — DX: Other chronic pain: G89.29

## 2011-07-03 LAB — CARDIAC PANEL(CRET KIN+CKTOT+MB+TROPI)
CK, MB: 1.5 ng/mL (ref 0.3–4.0)
Relative Index: INVALID (ref 0.0–2.5)
Total CK: 38 U/L (ref 7–177)
Troponin I: <0.3 ng/mL (ref <0.30)

## 2011-07-03 LAB — CBC
Hemoglobin: 11 g/dL — ABNORMAL LOW (ref 12.0–15.0)
MCH: 22.6 pg — ABNORMAL LOW (ref 26.0–34.0)
MCHC: 29.8 g/dL — ABNORMAL LOW (ref 30.0–36.0)
Platelets: 365 10*3/uL (ref 150–400)
RDW: 16.1 % — ABNORMAL HIGH (ref 11.5–15.5)

## 2011-07-03 LAB — PHOSPHORUS: Phosphorus: 4 mg/dL (ref 2.3–4.6)

## 2011-07-03 LAB — HEPATIC FUNCTION PANEL
AST: 10 U/L (ref 0–37)
Albumin: 3.2 g/dL — ABNORMAL LOW (ref 3.5–5.2)
Total Bilirubin: 0.4 mg/dL (ref 0.3–1.2)
Total Protein: 7.6 g/dL (ref 6.0–8.3)

## 2011-07-03 LAB — MAGNESIUM: Magnesium: 1.8 mg/dL (ref 1.5–2.5)

## 2011-07-03 LAB — BASIC METABOLIC PANEL
Calcium: 9.3 mg/dL (ref 8.4–10.5)
GFR calc Af Amer: >90 mL/min (ref >90)
GFR calc non Af Amer: >90 mL/min (ref >90)
Potassium: 3.8 mEq/L (ref 3.5–5.1)
Sodium: 136 mEq/L (ref 135–145)

## 2011-07-03 MED ORDER — ONDANSETRON HCL 4 MG/2ML IJ SOLN: 4.0000 mg | Freq: Four times a day (QID) | INTRAMUSCULAR | Status: DC | PRN

## 2011-07-03 MED ORDER — HYDROMORPHONE HCL PF 1 MG/ML IJ SOLN
0.5000 mg | INTRAMUSCULAR | Status: DC | PRN
  Administered 2011-07-03: 0.5 mg via INTRAVENOUS
  Filled 2011-07-03: qty 1

## 2011-07-03 MED ORDER — ENOXAPARIN SODIUM 40 MG/0.4ML ~~LOC~~ SOLN
40.0000 mg | SUBCUTANEOUS | Status: DC
  Administered 2011-07-03: 40 mg via SUBCUTANEOUS
  Filled 2011-07-03: qty 0.4

## 2011-07-03 MED ORDER — ALUM & MAG HYDROXIDE-SIMETH 200-200-20 MG/5ML PO SUSP: 30.0000 mL | Freq: Four times a day (QID) | ORAL | Status: DC | PRN

## 2011-07-03 MED ORDER — ACETAMINOPHEN 325 MG PO TABS: 650.0000 mg | ORAL_TABLET | Freq: Four times a day (QID) | ORAL | Status: DC | PRN

## 2011-07-03 MED ORDER — ONDANSETRON HCL 4 MG PO TABS: 4.0000 mg | ORAL_TABLET | Freq: Four times a day (QID) | ORAL | Status: DC | PRN

## 2011-07-03 MED ORDER — ASPIRIN EC 81 MG PO TBEC
81.0000 mg | DELAYED_RELEASE_TABLET | Freq: Every day | ORAL | Status: DC
  Administered 2011-07-04: 81 mg via ORAL
  Filled 2011-07-03: qty 1

## 2011-07-03 MED ORDER — POTASSIUM CHLORIDE IN NACL 20-0.9 MEQ/L-% IV SOLN
INTRAVENOUS | Status: DC
  Administered 2011-07-03: 50 mL via INTRAVENOUS

## 2011-07-03 MED ORDER — ACETAMINOPHEN 650 MG RE SUPP: 650.0000 mg | Freq: Four times a day (QID) | RECTAL | Status: DC | PRN

## 2011-07-03 MED ORDER — HYDROMORPHONE HCL PF 1 MG/ML IJ SOLN
1.0000 mg | Freq: Once | INTRAMUSCULAR | Status: AC
  Administered 2011-07-03: 1 mg via INTRAVENOUS
  Filled 2011-07-03: qty 1

## 2011-07-03 MED ORDER — DOCUSATE SODIUM 100 MG PO CAPS
100.0000 mg | ORAL_CAPSULE | Freq: Two times a day (BID) | ORAL | Status: DC
  Administered 2011-07-03 – 2011-07-04 (×2): 100 mg via ORAL
  Filled 2011-07-03: qty 1

## 2011-07-03 MED ORDER — OXYCODONE HCL 5 MG PO TABS
5.0000 mg | ORAL_TABLET | ORAL | Status: DC | PRN
  Administered 2011-07-03 – 2011-07-04 (×4): 5 mg via ORAL
  Filled 2011-07-03 (×4): qty 1

## 2011-07-03 MED ORDER — ASPIRIN 81 MG PO CHEW
324.0000 mg | CHEWABLE_TABLET | Freq: Once | ORAL | Status: AC
  Administered 2011-07-03: 324 mg via ORAL
  Filled 2011-07-03: qty 4

## 2011-07-03 MED ORDER — GUAIFENESIN-DM 100-10 MG/5ML PO SYRP: 5.0000 mL | ORAL_SOLUTION | ORAL | Status: DC | PRN

## 2011-07-03 MED ORDER — ALBUTEROL SULFATE (5 MG/ML) 0.5% IN NEBU: 2.5000 mg | INHALATION_SOLUTION | RESPIRATORY_TRACT | Status: DC | PRN

## 2011-07-03 NOTE — ED Notes (Signed)
Left sided headache that started two days ago.  Pt reports left side facial droop since 0400 this morning.  C/o mild weakness in left hand and blurry vision in left eye.  Pt alert and oriented x 4 at this time.  Obvious left sided facial droop in triage.

## 2011-07-03 NOTE — Progress Notes (Signed)
*  PRELIMINARY RESULTS* Echocardiogram 2D Echocardiogram has been performed.  [name] 07/03/2011, 2:14 PM

## 2011-07-03 NOTE — ED Provider Notes (Addendum)
History     CSN: [id number]  Arrival date & time 07/03/11  1108   First MD Initiated Contact with Patient 07/03/11 1123      Chief Complaint  Patient presents with  . Headache  . left side facial droop     (Consider location/radiation/quality/duration/timing/severity/associated sxs/prior treatment) Patient is a 51 y.o. female presenting with headaches.  Headache    Complains of left-sided headache sharp in nature accompanied by blurred vision in left eye, weakness in left arm and left leg gradual onset 2 days ago. No treatment prior to coming here no other complaint no other associated symptoms no nausea. Nothing makes symptoms better or worse headache is severe dull in quality Past Medical History  Diagnosis Date  . Obesity   . Migraine headache   . Depression   . Arthritis     of the knees     Past Surgical History  Procedure Date  . Total abdominal hysterectomy 1995     non cancerous, partial has her ovaries   . Cholecytectomy 2007    approx. Jenkins   . Thyroidectomy, partial 2004    approx   . Arthroscopy right knee 1995    Dr. Cleophas Dunker   . Total knee arthroplasty May 24, 20111    Left knee , Dr. Dion Saucier   . Bilateral carpal tunnel release 1991  . 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 Diabetes Mother   . Diabetes Father     emphsema     History  Substance Use Topics  . Smoking status: Never Smoker   . Smokeless tobacco: Not on file  . Alcohol Use: No    OB History    Grav Para Term Preterm Abortions TAB SAB Ect Mult Living                  Review of Systems  Eyes: Positive for visual disturbance.  Respiratory: Negative.   Cardiovascular: Negative.   Gastrointestinal: Negative.   Musculoskeletal: Negative.   Skin: Negative.   Neurological: Positive for weakness and headaches.  Hematological: Negative.   Psychiatric/Behavioral: Negative.   All other systems reviewed and  are negative.    Allergies  Latex  Home Medications   Current Outpatient Rx  Name Route Sig Dispense Refill  . IBUPROFEN 800 MG PO TABS Oral Take 1 tablet (800 mg total) by mouth 2 (two) times daily. Take 1 tablet by mouth two times a day , please fill after June 09, 2009 60 tablet 3  . PREDNISONE 10 MG PO TABS Oral Take 2 tablets (20 mg total) by mouth daily. 10 tablet 0    BP 185/92  Pulse 80  Temp(Src) 98.2 F (36.8 C) (Oral)  Resp 20  Ht 5\' 2"  (1.575 m)  Wt 360 lb (163.295 kg)  BMI 65.84 kg/m2  SpO2 99%  Physical Exam  Nursing note and vitals reviewed. Constitutional: She is oriented to person, place, and time. She appears well-developed and well-nourished.  HENT:  Head: Normocephalic and atraumatic.  Right Ear: External ear normal.  Left Ear: External ear normal.       Left mouth droop  Eyes: Conjunctivae are normal. Pupils are equal, round, and reactive to light.  Neck: Neck supple. No tracheal deviation present. No thyromegaly present.  Cardiovascular: Normal rate and regular rhythm.   No murmur heard. Pulmonary/Chest: Effort normal and breath sounds normal.  Abdominal: Soft. Bowel sounds are normal.  She exhibits no distension. There is no tenderness.  Musculoskeletal: Normal range of motion. She exhibits no edema and no tenderness.  Neurological: She is alert and oriented to person, place, and time. Coordination normal.       Motor strength left upper extremity 4/5, left lower extremity 4/5, right upper extremity 5 over 5, right lower extremity 5 over 5  Skin: Skin is warm and dry. No rash noted.  Psychiatric: She has a normal mood and affect.    ED Course  Procedures (including critical care time)  Date: 07/03/2011  Rate: 70  Rhythm: normal sinus rhythm  QRS Axis: normal  Intervals: normal  ST/T Wave abnormalities: normal  Conduction Disutrbances: none  Narrative Interpretation: unremarkable  Unchanged from 06/09/2009 1:15 PM patient's headache is  improved after treatment with intravenous narcotics Teleneurolgy consult at on patient, I spoke with the consultant, who states symptoms consistent with stroke,subacute  Labs Reviewed  CBC  BASIC METABOLIC PANEL   No results found. Results for orders placed during the hospital encounter of 07/03/11  CBC      Component Value Range   WBC 7.9  4.0 - 10.5 (K/uL)   RBC 4.87  3.87 - 5.11 (MIL/uL)   Hemoglobin 11.0 (*) 12.0 - 15.0 (g/dL)   HCT 16.1  09.6 - 04.5 (%)   MCV 75.8 (*) 78.0 - 100.0 (fL)   MCH 22.6 (*) 26.0 - 34.0 (pg)   MCHC 29.8 (*) 30.0 - 36.0 (g/dL)   RDW 40.9 (*) 81.1 - 15.5 (%)   Platelets 365  150 - 400 (K/uL)  BASIC METABOLIC PANEL      Component Value Range   Sodium 136  135 - 145 (mEq/L)   Potassium 3.8  3.5 - 5.1 (mEq/L)   Chloride 100  96 - 112 (mEq/L)   CO2 28  19 - 32 (mEq/L)   Glucose, Bld 100 (*) 70 - 99 (mg/dL)   BUN 12  6 - 23 (mg/dL)   Creatinine, Ser 9.14  0.50 - 1.10 (mg/dL)   Calcium 9.3  8.4 - 78.2 (mg/dL)   GFR calc non Af Amer >90  >90 (mL/min)   GFR calc Af Amer >90  >90 (mL/min)   Ct Head Wo Contrast  07/03/2011  *RADIOLOGY REPORT*  Clinical Data: Headache.  CT HEAD WITHOUT CONTRAST  Technique:  Contiguous axial images were obtained from the base of the skull through the vertex without contrast.  Comparison: MRI brain 12/26/2005.  Findings: No acute cortical infarct, hemorrhage, mass lesion is present.  Ventricles are of normal size.  No significant extra- axial fluid collection is evident.  The paranasal sinuses and mastoid air cells are clear.  The osseous skull is intact.  IMPRESSION: Negative CT of the head.  Original Report Authenticated By: Jamesetta Orleans. MATTERN, M.D.     No diagnosis found.  CRITICAL CARE Performed by: Doug Sou   Total critical care time: 30 minute  Critical care time was exclusive of separately billable procedures and treating other patients.  Critical care was necessary to treat or prevent imminent or  life-threatening deterioration.  Critical care was time spent personally by me on the following activities: development of treatment plan with patient and/or surrogate as well as nursing, discussions with consultants, evaluation of patient's response to treatment, examination of patient, obtaining history from patient or surrogate, ordering and performing treatments and interventions, ordering and review of laboratory studies, ordering and review of radiographic studies, pulse oximetry and re-evaluation of patient's condition.  MDM  Spoke with Dr. Sherrie Mustache plan admit telemetry Pain control Stroke workup . Aspirin Patient has passed swallowing screen Diagnosis #1 stroke #2 headache        Doug Sou, MD 07/03/11 1333  Doug Sou, MD 07/03/11 1333  Doug Sou, MD 07/03/11 1650

## 2011-07-03 NOTE — ED Notes (Signed)
Pt was transported to MRI, states she could not tolerate going into the MRI machine - states she has had previous experience w/ MRI machines and that even with sedation has not been able to tolerate the procedure.  Dr. Sherrie Mustache advised of same; pt will not have MRI.

## 2011-07-03 NOTE — H&P (Addendum)
[name] MRN: [id number] DOB/AGE: Apr 30, 1960 51 y.o. Primary Care Physician:Margaret Lodema Hong, MD, MD Admit date: 07/03/2011 Chief Complaint: Left-sided headache, twisted mouth, and blurred vision. HPI: The patient is a 51 year old woman with a past medical history significant for degenerative joint disease, obesity, and migraine headaches, who presents today with a chief complaint of a left-sided headache. Her headache started approximately 2 days ago. It is located on the left posterior side and it radiates to the left temple and left forehead . She describes the pain as sharp. It has been intermittent over the past 2 days. Nothing makes it worse or better. It is 10 over 10 in intensity. She developed a twisted mouth at approximately 4 AM this morning. She says that she has had an associated twisted mouth with migraine headaches in the past. She has been evaluated for a stroke and TIA in the past but there was no significant abnormalities during previous workups. She was also told that she did not have Bell's palsy. She has had associated blurred vision in the left eye only. She has had double vision only once or twice. She has had no complete blindness. She has some numbness on the left side of her mouth. She denies any difficulty speaking, swallowing, or understanding speech. She has had mild left hand and left leg weakness. She is right-handed. She denies associated nausea, vomiting, diarrhea, chest pain, or shortness of breath. She has had no abdominal pain, fever, chills, or pain with urination. She does occasionally have swelling in her legs for which she takes Lasix periodically when prescribed by her primary care physician.  In the emergency department, she was initially hypertensive with a blood pressure 185/92, but now, her blood pressure has normalized. She is otherwise afebrile and hemodynamically stable. The CT scan of her head reveals no acute intracranial findings. Her EKG is nonacute.  Her lab data are significant only for hemoglobin of 11.0, MCV of 75.8, and a glucose of 100. She is being admitted for further evaluation and management.   Past Medical History  Diagnosis Date  . Morbid obesity   . Migraine headache   . Depression   . Arthritis     of the knees   . Prediabetes   . Chronic back pain     Past Surgical History  Procedure Date  . Total abdominal hysterectomy 1995     non cancerous, partial has her ovaries   . Cholecytectomy 2007    approx. Jenkins   . Thyroidectomy, partial 2004    approx   . Arthroscopy right knee 1995    Dr. Cleophas Dunker   . Total knee arthroplasty May 24, 20111    Left knee , Dr. Dion Saucier   . Bilateral carpal tunnel release 1991  . 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 Authorizing Provider  ibuprofen (ADVIL,MOTRIN) 800 MG tablet Take 1 tablet (800 mg total) by mouth 2 (two) times daily. Take 1 tablet by mouth two times a day , please fill after June 09, 2009 03/14/11   Kerri Perches, MD    Allergies:  Allergies  Allergen Reactions  . Latex     Family History  Problem Relation Age of Onset  . Diabetes Mother   . Diabetes Father     emphsema     Social History: She is divorced. She lives in [place]. She has 3 children. She does not work. 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 She denies tobacco, alcohol, and illicit drug use.  reports that she has never smoked. She does not have any smokeless tobacco history on file. She reports that she does not drink alcohol or use illicit drugs.         ROS: As above in history present illness. In addition, she has chronic pain in both of her knees. Otherwise review of systems is negative.  PHYSICAL EXAM: Blood pressure 113/63, pulse 57, temperature 98.6 F (37 C), temperature source Oral, resp. rate 16, height 5\' 2"  (1.575 m),  weight 163.295 kg (360 lb), SpO2 95.00%.  General: Pleasant 51 year old obese Afro-American woman sitting up in bed, in no acute distress. HEENT: Head is normocephalic, nontraumatic. Pupils are equal, round, and reactive to light. Extraocular movement are intact. Conjunctivae are clear. Sclerae white. Oropharynx reveals mildly dry mucous membranes. No posterior exudates or erythema. Neck: Supple, mildly tender over the posterior area of the cervical muscles. No adenopathy, no thyromegaly. Radiating bruit bilaterally. Well-healed horizontal scar. Lungs: Clear to auscultation bilaterally. Heart: S1, S2, with a 2/6 systolic murmur. Abdomen: Morbidly obese, positive bowel sounds, soft, nontender, nondistended. Extremities: Pedal pulses palpable. No pretibial edema and no pedal edema. Neurologic: She is alert and oriented x3. She has a clear left mouth group but less of a total left-sided facial droop. This is not a clear 7th nerve palsy. She is able to close and open her eyes with sufficient strength. Mild decreased sensation at the left mouth/submental area but otherwise, facial sensation intact. No aphasia and no dysarthria. Very mild left pronator drift. Handgrips 5 over 5 otherwise bilaterally and symmetrically. 5 minus over 5 left lower extremity strength and 5 over 5 right upper extremity and right lower extremity strength. Cerebellar with finger-to-nose testing is intact and normal bilaterally.  Basic Metabolic Panel:  Basename 07/03/11 1157  NA 136  K 3.8  CL 100  CO2 28  GLUCOSE 100*  BUN 12  CREATININE 0.66  CALCIUM 9.3  MG --  PHOS --   Liver Function Tests: No results found for this basename: AST:2,ALT:2,ALKPHOS:2,BILITOT:2,PROT:2,ALBUMIN:2 in the last 72 hours No results found for this basename: LIPASE:2,AMYLASE:2 in the last 72 hours No results found for this basename: AMMONIA:2 in the last 72 hours CBC:  Basename 07/03/11 1157  WBC 7.9  NEUTROABS --  HGB 11.0*  HCT 36.9    MCV 75.8*  PLT 365   Cardiac Enzymes: No results found for this basename: CKTOTAL:3,CKMB:3,CKMBINDEX:3,TROPONINI:3 in the last 72 hours BNP: No results found for this basename: PROBNP:3 in the last 72 hours D-Dimer: No results found for this basename: DDIMER:2 in the last 72 hours CBG: No results found for this basename: GLUCAP:6 in the last 72 hours Hemoglobin A1C: No results found for this basename: HGBA1C in the last 72 hours Fasting Lipid Panel: No results found for this basename: CHOL,HDL,LDLCALC,TRIG,CHOLHDL,LDLDIRECT in the last 72 hours Thyroid Function Tests: No results found for this basename: TSH,T4TOTAL,FREET4,T3FREE,THYROIDAB in the last 72 hours Anemia Panel: No results found for this basename: VITAMINB12,FOLATE,FERRITIN,TIBC,IRON,RETICCTPCT in the last 72 hours Coagulation: No results found for this basename: LABPROT:2,INR:2 in the last 72 hours Urine Drug Screen: Drugs of Abuse     Component Value Date/Time   LABOPIA NONE DETECTED 05/17/2008 1424   COCAINSCRNUR NONE DETECTED 05/17/2008 1424   LABBENZ NONE DETECTED 05/17/2008 1424   AMPHETMU NONE DETECTED 05/17/2008 1424   THCU NONE DETECTED 05/17/2008 1424   LABBARB  Value:  NONE DETECTED        DRUG SCREEN FOR MEDICAL PURPOSES ONLY.  IF CONFIRMATION IS NEEDED FOR ANY PURPOSE, NOTIFY LAB WITHIN 5 DAYS.        LOWEST DETECTABLE LIMITS FOR URINE DRUG SCREEN Drug Class       Cutoff (ng/mL) Amphetamine      1000 Barbiturate      200 Benzodiazepine   200 Tricyclics       300 Opiates          300 Cocaine          300 THC              50 05/17/2008 1424    Alcohol Level: No results found for this basename: ETH:2 in the last 72 hours Urinalysis: No results found for this basename: COLORURINE:2,APPERANCEUR:2,LABSPEC:2,PHURINE:2,GLUCOSEU:2,HGBUR:2,BILIRUBINUR:2,KETONESUR:2,PROTEINUR:2,UROBILINOGEN:2,NITRITE:2,LEUKOCYTESUR:2 in the last 72 hours Misc. Labs:   EKG: Normal sinus rhythm with a heart rate of 70 beats per minute. No  abnormalities.  No results found for this or any previous visit (from the past 240 hour(s)).   Results for orders placed during the hospital encounter of 07/03/11 (from the past 48 hour(s))  CBC     Status: Abnormal   Collection Time   07/03/11 11:57 AM      Component Value Range Comment   WBC 7.9  4.0 - 10.5 (K/uL)    RBC 4.87  3.87 - 5.11 (MIL/uL)    Hemoglobin 11.0 (*) 12.0 - 15.0 (g/dL)    HCT 16.1  09.6 - 04.5 (%)    MCV 75.8 (*) 78.0 - 100.0 (fL)    MCH 22.6 (*) 26.0 - 34.0 (pg)    MCHC 29.8 (*) 30.0 - 36.0 (g/dL)    RDW 40.9 (*) 81.1 - 15.5 (%)    Platelets 365  150 - 400 (K/uL)   BASIC METABOLIC PANEL     Status: Abnormal   Collection Time   07/03/11 11:57 AM      Component Value Range Comment   Sodium 136  135 - 145 (mEq/L)    Potassium 3.8  3.5 - 5.1 (mEq/L)    Chloride 100  96 - 112 (mEq/L)    CO2 28  19 - 32 (mEq/L)    Glucose, Bld 100 (*) 70 - 99 (mg/dL)    BUN 12  6 - 23 (mg/dL)    Creatinine, Ser 9.14  0.50 - 1.10 (mg/dL)    Calcium 9.3  8.4 - 10.5 (mg/dL)    GFR calc non Af Amer >90  >90 (mL/min)    GFR calc Af Amer >90  >90 (mL/min)     Ct Head Wo Contrast  07/03/2011  *RADIOLOGY REPORT*  Clinical Data: Headache.  CT HEAD WITHOUT CONTRAST  Technique:  Contiguous axial images were obtained from the base of the skull through the vertex without contrast.  Comparison: MRI brain 12/26/2005.  Findings: No acute cortical infarct, hemorrhage, mass lesion is present.  Ventricles are of normal size.  No significant extra- axial fluid collection is evident.  The paranasal sinuses and mastoid air cells are clear.  The osseous skull is intact.  IMPRESSION: Negative CT of the head.  Original Report Authenticated By: Jamesetta Orleans. MATTERN, M.D.   US Carotid Duplex Bilateral  07/03/2011  *RADIOLOGY REPORT*  Clinical Data: Left visual changes.  Question stroke.  BILATERAL CAROTID DUPLEX ULTRASOUND  Technique: Wallace Cullens scale imaging, color Doppler and duplex ultrasound was performed  of bilateral carotid and vertebral arteries in the neck.  Comparison:  None.  Criteria:  Quantification of carotid stenosis is based on velocity parameters that correlate the residual internal carotid diameter with NASCET-based stenosis levels, using the diameter of the distal internal carotid lumen as the denominator for stenosis measurement.  The following velocity measurements were obtained:                   PEAK SYSTOLIC/END DIASTOLIC RIGHT ICA:                        90/29cm/sec CCA:                        109/27cm/sec SYSTOLIC ICA/CCA RATIO:     0.82 DIASTOLIC ICA/CCA RATIO:    1.10 ECA:                        68cm/sec  LEFT ICA:                        68/24cm/sec CCA:                        95/21cm/sec SYSTOLIC ICA/CCA RATIO:     0.71 DIASTOLIC ICA/CCA RATIO:    1.15 ECA:                        73cm/sec  Findings:  RIGHT CAROTID ARTERY: Mild intimal thickening in the carotid bulb. No significant plaque formation or stenosis.  RIGHT VERTEBRAL ARTERY:   Antegrade flow.  LEFT CAROTID ARTERY: Mild intimal thickening in the carotid bulb. No significant plaque formation or stenosis.  LEFT VERTEBRAL ARTERY:  Antegrade flow.  IMPRESSION: Mild intimal thickening bilaterally.  No significant plaque formation or stenosis.  Original Report Authenticated By: Cyndie Chime, M.D.    Impression:  Active Problems:  Morbid obesity  Prediabetes  Headache  Facial droop  Left-sided weakness  Blurred vision, left eye  Bradycardia  Facial palsy  Anemia  51 year old obese woman with history of migraine headaches presents with a left-sided headache, associated left facial droop, left blurred vision, and mild left-sided weakness. The patient may have a complex migraine headache with neurological findings. An acute CVA or TIA his also considered, however, it appears most likely that she has some type of facial palsy not associated with a CVA. 2-D echocardiogram, carotid ultrasound, and MRI were ordered. The patient  could not tolerate the MRI of her brain and therefore the order was canceled. The carotid ultrasound reveals no ICA stenosis. The 2-D echocardiogram reveals preserved LV function and no significant valvular abnormalities.   Of note, the ED physician obtained a tele-neurology consult in the emergency department. The  neurologist recommended admission for stroke or TIA workup.   Plan: 1. Will consult neurology in the morning. We'll hold off on further radiographic testing such as a cerebral angiogram until further discussed with neurology. 2. Analgesics as needed for headache. 3. Physical therapy consultation. 4. Gentle IV fluids and supportive care. 5. For further evaluation, we'll order TSH, free T4, vitamin B12, RPR, and hemoglobin A1c.       Marcia Hartwell 07/03/2011, 4:45 PM

## 2011-07-04 ENCOUNTER — Encounter (HOSPITAL_COMMUNITY): Payer: Self-pay | Admitting: Internal Medicine

## 2011-07-04 ENCOUNTER — Ambulatory Visit: Payer: Self-pay | Admitting: Family Medicine

## 2011-07-04 DIAGNOSIS — Z9889 Other specified postprocedural states: Secondary | ICD-10-CM | POA: Insufficient documentation

## 2011-07-04 DIAGNOSIS — E890 Postprocedural hypothyroidism: Secondary | ICD-10-CM | POA: Insufficient documentation

## 2011-07-04 LAB — CBC
MCH: 22.5 pg — ABNORMAL LOW (ref 26.0–34.0)
Platelets: 347 10*3/uL (ref 150–400)
RBC: 4.71 MIL/uL (ref 3.87–5.11)
RDW: 16.2 % — ABNORMAL HIGH (ref 11.5–15.5)
WBC: 7.5 10*3/uL (ref 4.0–10.5)

## 2011-07-04 LAB — TSH: TSH: 6.224 u[IU]/mL — ABNORMAL HIGH (ref 0.350–4.500)

## 2011-07-04 LAB — RPR: RPR Ser Ql: NONREACTIVE

## 2011-07-04 LAB — BASIC METABOLIC PANEL
CO2: 30 mEq/L (ref 19–32)
Calcium: 9 mg/dL (ref 8.4–10.5)
Chloride: 100 mEq/L (ref 96–112)
GFR calc non Af Amer: >90 mL/min (ref >90)
Potassium: 4.5 mEq/L (ref 3.5–5.1)

## 2011-07-04 LAB — HEMOGLOBIN A1C: Hgb A1c MFr Bld: 6.6 % — ABNORMAL HIGH (ref <5.7)

## 2011-07-04 MED ORDER — OXYCODONE-ACETAMINOPHEN 5-325 MG PO TABS: 1.0000 | ORAL_TABLET | ORAL | Status: AC | PRN

## 2011-07-04 MED ORDER — LEVOTHYROXINE SODIUM 25 MCG PO TABS: 25.0000 ug | ORAL_TABLET | Freq: Every day | ORAL | Status: DC

## 2011-07-04 MED ORDER — ASPIRIN 81 MG PO TBEC: 81.0000 mg | DELAYED_RELEASE_TABLET | Freq: Every day | ORAL | Status: AC

## 2011-07-04 MED ORDER — ENOXAPARIN SODIUM 80 MG/0.8ML ~~LOC~~ SOLN: 80.0000 mg | SUBCUTANEOUS | Status: DC

## 2011-07-04 NOTE — Plan of Care (Signed)
Problem: Food- and Nutrition-Related Knowledge Deficit (NB-1.1) Goal: Nutrition education Formal process to instruct or train a patient/client in a skill or to impart knowledge to help patients/clients voluntarily manage or modify food choices and eating behavior to maintain or improve health.  Outcome: Adequate for Discharge Pt currently receiving CHO Mod Med diet. We reviewed the Basic principles of a healthy diet and the importance of portion control. She was provided handouts including Plate Method and Tips for Successful Weight Loss and 30-day Menu Planner that contains a healthy foods shopping list. We also discussed strategies for consistent small meal intake and the benefit. Encouraged her to consume sugar-free beverages and to set 2-3 short-term goals to get her started on modifying her current  eating patterns.  Rec she follow-up with outpatient RD for monitoring and additional nutritional  counseling.

## 2011-07-04 NOTE — Consult Note (Signed)
[name], [name]          ACCOUNT NO.:  [id number]  MEDICAL RECORD NO.:  [id number]  LOCATION:  A335                          FACILITY:  [facility]  PHYSICIAN:  Burdette Forehand A. Gerilyn Pilgrim, M.D. DATE OF BIRTH:  21-Sep-1960  DATE OF CONSULTATION:  07/04/2011 DATE OF DISCHARGE:                                CONSULTATION   HISTORY OF PRESENT ILLNESS:  This is a 51 year old right-handed black female who has a baseline history of episodic headaches.  The headaches are actually quite frequent, occurring almost daily.  Headaches involve both sides of the head and when severe it can be associated with left- sided numbness and weakness of the facial muscles.  She has had this for many years.  She has had numerous imaging including 4 MRIs done since 2004 and 2 brain MRAs, that all have been unremarkable.  She has had MRA done of the neck, also has been unremarkable.  The left facial weakness comes and go with more severe headaches.  The patient reports that she still have somewhat unusual headache at this time starting in the occipital region on the left side and radiating anterior to the orbital area associated with throwing up her face and left facial weakness and numbness.  This time, she also reports some blurry vision on the left side.  She denies any weakness of the legs.  She does report having some left upper extremity weakness, which is brand new for the patient.  The patient sees Dr. Lodema Hong for reason of unknown.  She is not on prophylactic mentation.  It is unclear which medicines she has tried in the past for her symptoms.  She did have carotid duplex Doppler which is reviewed and unremarkable.  Head CT scan is negative.  She was set up to have MRI twice, but turned as being quite claustrophobic.  Her previous MRIs were done in [place] at an open MRI suite.  PHYSICAL EXAMINATION:  GENERAL:  A massively obese lady.  She is in no acute distress.  She is in significant discomfort from  headaches. HEENT:  Flattening of the nasolabial fold on the left.  Neck is supple. Head is normocephalic and atraumatic. ABDOMEN:  Obese but soft. EXTREMITIES:  No significant edema.  She is status post incision scar of the left knee from knee replacement. MENTATION:  She is awake and alert.  Speech, language, and cognition are intact.  Cranial nerve evaluation showed the pupils are equal, round, reactive to light and accommodation.  Facial muscle strength again shows flattening of nasolabial fold on the right.  Tongue is midline.  She has marked crowding of the posterior airspace and likely have increased risk of apnea.  Uvula midline.  Visual fields are intact.  Motor examination shows mild weakness, deltoid, left side and hand grip, both are graded about 4+/5.  Other extremities shows normal tone, bulk, and strength, 5/5 throughout.  Triceps on the left is also 5+.  Reflexes are observed to be 2 including the legs.  Plantars are both downgoing.  Sensation normal to light touch and pinprick.  Coordination shows no dysmetria, no past-pointing, no tremors.  LABORATORY DATA:  Laboratory evaluation shows evidence of mild hypothyroidism.  TSH is 6.2, although  the free T3 is normal at 0.82. Vitamin B12 level 580.  RPR nonreactive.  CPK 38.  Again, the patient has had MRI since 2004.  She had 2 MRIs in 2004, 2006, 2007, all within normal.  MRA done in 2004 and 2006 of the brain and those have also be negative.  ASSESSMENT:  This is a lady who has a new type of headache.  She seemed to have left upper extremity weakness, which she thinks is new.  This possibly could be due to small subcortical infarct.  She does have risk factors.  However, given her history of complicated migraine, this could represent complicated migraine.  The character of the new headache also suggest a possibility of occipital neuralgia.  She does have some significant soreness in the occipital area on the left  side.  RECOMMENDATION:  I agree with aspirin.  She should be set up for a MRI. This can be done in outpatient setting in the next few days to evaluate for possible small subcortical infarct.  Again, continue with the aspirin.  The patient acutely treated symptomatically for headaches with Fioricet or Percocet or any other analgesics.  She should follow up with [place] in a week or 2, hopefully after she has had her MRI.     Slayter Moorhouse A. Gerilyn Pilgrim, M.D.     KAD/MEDQ  D:  07/04/2011  T:  07/04/2011  Job:  [id number]

## 2011-07-04 NOTE — Progress Notes (Signed)
Attempted to see pt for eval.  She states that she does not need an eval...has been up and around, no difficulties other than a bit of L grip strength and mild L facial droop.  We will d/c orders by pt request.

## 2011-07-04 NOTE — Consult Note (Signed)
Reason for Consult: Referring Physician:   [name] is an 51 y.o. female.  HPI:   Past Medical History  Diagnosis Date  . Morbid obesity   . Migraine headache   . Depression   . Arthritis     of the knees   . Prediabetes   . Chronic back pain   . Hypothyroidism 07/04/2011    Past Surgical History  Procedure Date  . Total abdominal hysterectomy 1995     non cancerous, partial has her ovaries   . Cholecytectomy 2007    approx. Jenkins   . Thyroidectomy, partial 2004    approx   . Arthroscopy right knee 1995    Dr. Cleophas Dunker   . Total knee arthroplasty May 24, 20111    Left knee , Dr. Dion Saucier   . Bilateral carpal tunnel release 1991  . 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 Diabetes Mother   . Diabetes Father     emphsema     Social History:  reports that she has never smoked. She does not have any smokeless tobacco history on file. She reports that she does not drink alcohol or use illicit drugs.  Allergies:  Allergies  Allergen Reactions  . Latex     Medications:  Prior to Admission medications   Medication Sig Start Date End Date Taking? Authorizing Provider  ibuprofen (ADVIL,MOTRIN) 800 MG tablet Take 1 tablet (800 mg total) by mouth 2 (two) times daily. Take 1 tablet by mouth two times a day , please fill after June 09, 2009 03/14/11   Kerri Perches, MD   Scheduled Meds:   . aspirin  324 mg Oral Once  . aspirin EC  81 mg Oral Daily  . docusate sodium  100 mg Oral BID  . enoxaparin  40 mg Subcutaneous Q24H  . HYDROmorphone  1 mg Intravenous Once  . levothyroxine  25 mcg Oral QAC breakfast   Continuous Infusions:   . 0.9 % NaCl with KCl 20 mEq / L 50 mL (07/03/11 1802)   PRN Meds:.acetaminophen, acetaminophen, albuterol, alum & mag hydroxide-simeth, guaiFENesin-dextromethorphan, HYDROmorphone, ondansetron (ZOFRAN) IV, ondansetron, oxyCODONE   Results for orders  placed during the hospital encounter of 07/03/11 (from the past 48 hour(s))  CBC     Status: Abnormal   Collection Time   07/03/11 11:57 AM      Component Value Range Comment   WBC 7.9  4.0 - 10.5 (K/uL)    RBC 4.87  3.87 - 5.11 (MIL/uL)    Hemoglobin 11.0 (*) 12.0 - 15.0 (g/dL)    HCT 85.2  77.8 - 24.2 (%)    MCV 75.8 (*) 78.0 - 100.0 (fL)    MCH 22.6 (*) 26.0 - 34.0 (pg)    MCHC 29.8 (*) 30.0 - 36.0 (g/dL)    RDW 35.3 (*) 61.4 - 15.5 (%)    Platelets 365  150 - 400 (K/uL)   BASIC METABOLIC PANEL     Status: Abnormal   Collection Time   07/03/11 11:57 AM      Component Value Range Comment   Sodium 136  135 - 145 (mEq/L)    Potassium 3.8  3.5 - 5.1 (mEq/L)    Chloride 100  96 - 112 (mEq/L)    CO2 28  19 - 32 (mEq/L)    Glucose, Bld 100 (*) 70 - 99 (mg/dL)    BUN 12  6 -  23 (mg/dL)    Creatinine, Ser 1.61  0.50 - 1.10 (mg/dL)    Calcium 9.3  8.4 - 10.5 (mg/dL)    GFR calc non Af Amer >90  >90 (mL/min)    GFR calc Af Amer >90  >90 (mL/min)   MAGNESIUM     Status: Normal   Collection Time   07/03/11  4:58 PM      Component Value Range Comment   Magnesium 1.8  1.5 - 2.5 (mg/dL)   PHOSPHORUS     Status: Normal   Collection Time   07/03/11  4:58 PM      Component Value Range Comment   Phosphorus 4.0  2.3 - 4.6 (mg/dL)   HEPATIC FUNCTION PANEL     Status: Abnormal   Collection Time   07/03/11  4:58 PM      Component Value Range Comment   Total Protein 7.6  6.0 - 8.3 (g/dL)    Albumin 3.2 (*) 3.5 - 5.2 (g/dL)    AST 10  0 - 37 (U/L)    ALT 9  0 - 35 (U/L)    Alkaline Phosphatase 84  39 - 117 (U/L)    Total Bilirubin 0.4  0.3 - 1.2 (mg/dL)    Bilirubin, Direct <0.9  0.0 - 0.3 (mg/dL)    Indirect Bilirubin NOT CALCULATED  0.3 - 0.9 (mg/dL)   TSH     Status: Abnormal   Collection Time   07/03/11  4:58 PM      Component Value Range Comment   TSH 6.224 (*) 0.350 - 4.500 (uIU/mL)   CARDIAC PANEL(CRET KIN+CKTOT+MB+TROPI)     Status: Normal   Collection Time   07/03/11  4:58 PM       Component Value Range Comment   Total CK 38  7 - 177 (U/L)    CK, MB 1.5  0.3 - 4.0 (ng/mL)    Troponin I <0.30  <0.30 (ng/mL)    Relative Index RELATIVE INDEX IS INVALID  0.0 - 2.5    HEMOGLOBIN A1C     Status: Abnormal   Collection Time   07/03/11  4:58 PM      Component Value Range Comment   Hemoglobin A1C 6.6 (*) <5.7 (%)    Mean Plasma Glucose 143 (*) <117 (mg/dL)   T4, FREE     Status: Normal   Collection Time   07/03/11  4:58 PM      Component Value Range Comment   Free T4 0.82  0.80 - 1.80 (ng/dL)   RPR     Status: Normal   Collection Time   07/03/11  4:58 PM      Component Value Range Comment   RPR NON REACTIVE  NON REACTIVE    VITAMIN B12     Status: Normal   Collection Time   07/03/11  4:58 PM      Component Value Range Comment   Vitamin B-12 580  211 - 911 (pg/mL)   BASIC METABOLIC PANEL     Status: Abnormal   Collection Time   07/04/11  6:17 AM      Component Value Range Comment   Sodium 136  135 - 145 (mEq/L)    Potassium 4.5  3.5 - 5.1 (mEq/L)    Chloride 100  96 - 112 (mEq/L)    CO2 30  19 - 32 (mEq/L)    Glucose, Bld 104 (*) 70 - 99 (mg/dL)    BUN 11  6 - 23 (mg/dL)  Creatinine, Ser 0.70  0.50 - 1.10 (mg/dL)    Calcium 9.0  8.4 - 10.5 (mg/dL)    GFR calc non Af Amer >90  >90 (mL/min)    GFR calc Af Amer >90  >90 (mL/min)   CBC     Status: Abnormal   Collection Time   07/04/11  6:17 AM      Component Value Range Comment   WBC 7.5  4.0 - 10.5 (K/uL)    RBC 4.71  3.87 - 5.11 (MIL/uL)    Hemoglobin 10.6 (*) 12.0 - 15.0 (g/dL)    HCT 13.0 (*) 86.5 - 46.0 (%)    MCV 75.8 (*) 78.0 - 100.0 (fL)    MCH 22.5 (*) 26.0 - 34.0 (pg)    MCHC 29.7 (*) 30.0 - 36.0 (g/dL)    RDW 78.4 (*) 69.6 - 15.5 (%)    Platelets 347  150 - 400 (K/uL)     Ct Head Wo Contrast  07/03/2011  *RADIOLOGY REPORT*  Clinical Data: Headache.  CT HEAD WITHOUT CONTRAST  Technique:  Contiguous axial images were obtained from the base of the skull through the vertex without contrast.   Comparison: MRI brain 12/26/2005.  Findings: No acute cortical infarct, hemorrhage, mass lesion is present.  Ventricles are of normal size.  No significant extra- axial fluid collection is evident.  The paranasal sinuses and mastoid air cells are clear.  The osseous skull is intact.  IMPRESSION: Negative CT of the head.  Original Report Authenticated By: Jamesetta Orleans. MATTERN, M.D.   US Carotid Duplex Bilateral  07/03/2011  *RADIOLOGY REPORT*  Clinical Data: Left visual changes.  Question stroke.  BILATERAL CAROTID DUPLEX ULTRASOUND  Technique: Wallace Cullens scale imaging, color Doppler and duplex ultrasound was performed of bilateral carotid and vertebral arteries in the neck.  Comparison:  None.  Criteria:  Quantification of carotid stenosis is based on velocity parameters that correlate the residual internal carotid diameter with NASCET-based stenosis levels, using the diameter of the distal internal carotid lumen as the denominator for stenosis measurement.  The following velocity measurements were obtained:                   PEAK SYSTOLIC/END DIASTOLIC RIGHT ICA:                        90/29cm/sec CCA:                        109/27cm/sec SYSTOLIC ICA/CCA RATIO:     0.82 DIASTOLIC ICA/CCA RATIO:    1.10 ECA:                        68cm/sec  LEFT ICA:                        68/24cm/sec CCA:                        95/21cm/sec SYSTOLIC ICA/CCA RATIO:     0.71 DIASTOLIC ICA/CCA RATIO:    1.15 ECA:                        73cm/sec  Findings:  RIGHT CAROTID ARTERY: Mild intimal thickening in the carotid bulb. No significant plaque formation or stenosis.  RIGHT VERTEBRAL ARTERY:   Antegrade flow.  LEFT CAROTID ARTERY: Mild intimal thickening in the carotid bulb.  No significant plaque formation or stenosis.  LEFT VERTEBRAL ARTERY:  Antegrade flow.  IMPRESSION: Mild intimal thickening bilaterally.  No significant plaque formation or stenosis.  Original Report Authenticated By: Cyndie Chime, M.D.    Review of Systems    Constitutional: Negative.   HENT: Positive for neck pain.   Eyes: Positive for blurred vision.  Respiratory: Negative.   Cardiovascular: Negative.   Gastrointestinal: Positive for nausea.  Skin: Negative.   Endo/Heme/Allergies: Negative.   Psychiatric/Behavioral: Negative.    Blood pressure 137/82, pulse 72, temperature 98.1 F (36.7 C), temperature source Oral, resp. rate 18, height 5\' 2"  (1.575 m), weight 163.295 kg (360 lb), SpO2 96.00%. Physical Exam  Assessment/Plan: See dictation  [name] 07/04/2011, 9:38 AM

## 2011-07-04 NOTE — Progress Notes (Signed)
Writer discussed and reviewed discharge instructions and medications, pt verbalized understanding.  Encouraged to call with any questions that may arise.  Pt took all belongings and pt wheel chaired out in stable condition.  Follow up appt's made and encouraged to follow up and importance.

## 2011-07-05 ENCOUNTER — Other Ambulatory Visit: Payer: Self-pay | Admitting: Internal Medicine

## 2011-07-05 DIAGNOSIS — R58 Hemorrhage, not elsewhere classified: Secondary | ICD-10-CM

## 2011-07-05 NOTE — Discharge Summary (Signed)
Physician Discharge Summary  [name] MRN: [id number] DOB/AGE: 1960-06-01 51 y.o.  PCP: [name], MD, MD   Admit date: 07/03/2011 Discharge date: 07/04/2011  Discharge Diagnoses:  1. Sign/symptom complex including left-sided headache, left facial droop, blurred vision in the left eye, and mild left-sided weakness. The working diagnosis at the time of discharge was complex migraine, but acute stroke or TIA was not definitely excluded. MRI could not be performed during the hospitalization because the patient could not tolerate it. An open MRI will be scheduled as an outpatient upon discharge. 2. History of complex migraine headaches with associated left-sided numbness and weakness of facial muscles. She has had a number of MRIs including 2 brain MRIs in the past, mostly unremarkable per Dr. Gerilyn Pilgrim. 3. Newly diagnosed hypothyroidism. 4. Mild bradycardia, possibly secondary to hypothyroidism. 5. Morbid obesity. 6. Pre- diabetes. Her hemoglobin A1c was 6.6.    Medication List  As of 07/05/2011  7:30 AM   STOP taking these medications         ibuprofen 800 MG tablet      predniSONE 10 MG tablet         TAKE these medications         aspirin 81 MG EC tablet   Take 1 tablet (81 mg total) by mouth daily.      levothyroxine 25 MCG tablet   Commonly known as: SYNTHROID, LEVOTHROID   Take 1 tablet (25 mcg total) by mouth daily before breakfast. For hypothyroidism      oxyCODONE-acetaminophen 5-325 MG per tablet   Commonly known as: PERCOCET   Take 1 tablet by mouth every 4 (four) hours as needed for pain.            Discharge Condition: Stable  Disposition: 01-Home or Self Care   Consults: Beryle Beams, M.D.   Significant Diagnostic Studies: Ct Head Wo Contrast  07/03/2011  *RADIOLOGY REPORT*  Clinical Data: Headache.  CT HEAD WITHOUT CONTRAST  Technique:  Contiguous axial images were obtained from the base of the skull through the vertex without  contrast.  Comparison: MRI brain 12/26/2005.  Findings: No acute cortical infarct, hemorrhage, mass lesion is present.  Ventricles are of normal size.  No significant extra- axial fluid collection is evident.  The paranasal sinuses and mastoid air cells are clear.  The osseous skull is intact.  IMPRESSION: Negative CT of the head.  Original Report Authenticated By: Jamesetta Orleans. MATTERN, M.D.   US Carotid Duplex Bilateral  07/03/2011  *RADIOLOGY REPORT*  Clinical Data: Left visual changes.  Question stroke.  BILATERAL CAROTID DUPLEX ULTRASOUND  Technique: Wallace Cullens scale imaging, color Doppler and duplex ultrasound was performed of bilateral carotid and vertebral arteries in the neck.  Comparison:  None.  Criteria:  Quantification of carotid stenosis is based on velocity parameters that correlate the residual internal carotid diameter with NASCET-based stenosis levels, using the diameter of the distal internal carotid lumen as the denominator for stenosis measurement.  The following velocity measurements were obtained:                   PEAK SYSTOLIC/END DIASTOLIC RIGHT ICA:                        90/29cm/sec CCA:                        109/27cm/sec SYSTOLIC ICA/CCA RATIO:     0.82 DIASTOLIC ICA/CCA RATIO:  1.10 ECA:                        68cm/sec  LEFT ICA:                        68/24cm/sec CCA:                        95/21cm/sec SYSTOLIC ICA/CCA RATIO:     0.71 DIASTOLIC ICA/CCA RATIO:    1.15 ECA:                        73cm/sec  Findings:  RIGHT CAROTID ARTERY: Mild intimal thickening in the carotid bulb. No significant plaque formation or stenosis.  RIGHT VERTEBRAL ARTERY:   Antegrade flow.  LEFT CAROTID ARTERY: Mild intimal thickening in the carotid bulb. No significant plaque formation or stenosis.  LEFT VERTEBRAL ARTERY:  Antegrade flow.  IMPRESSION: Mild intimal thickening bilaterally.  No significant plaque formation or stenosis.  Original Report Authenticated By: Cyndie Chime, M.D.   ECHO: Study  Conclusions  - Left ventricle: The cavity size was normal. Wall thickness was increased in a pattern of mild LVH. Systolic function was normal. The estimated ejection fraction was in the range of 55% to 60%. Wall motion was normal; there were no regional wall motion abnormalities. The study is not technically sufficient to allow evaluation of LV diastolic function. - Mitral valve: Trivial regurgitation. - Atrial septum: No defect or patent foramen ovale was identified based on limited views. - Tricuspid valve: Trivial regurgitation. - Pericardium, extracardiac: There was no pericardial effusion. Transthoracic echocardiography. M-mode, complete 2D, spectral Doppler, and color Doppler. Height: Height: 157.5cm. Height: 62in. Weight: Weight: 163.3kg. Weight: 359.3lb. Body mass index: BMI: 65.8kg/m^2. Body surface area: BSA: 2.53m^2. Patient status: Inpatient.     Microbiology: No results found for this or any previous visit (from the past 240 hour(s)).   Labs: Results for orders placed during the hospital encounter of 07/03/11 (from the past 48 hour(s))  CBC     Status: Abnormal   Collection Time   07/03/11 11:57 AM      Component Value Range Comment   WBC 7.9  4.0 - 10.5 (K/uL)    RBC 4.87  3.87 - 5.11 (MIL/uL)    Hemoglobin 11.0 (*) 12.0 - 15.0 (g/dL)    HCT 16.1  09.6 - 04.5 (%)    MCV 75.8 (*) 78.0 - 100.0 (fL)    MCH 22.6 (*) 26.0 - 34.0 (pg)    MCHC 29.8 (*) 30.0 - 36.0 (g/dL)    RDW 40.9 (*) 81.1 - 15.5 (%)    Platelets 365  150 - 400 (K/uL)   BASIC METABOLIC PANEL     Status: Abnormal   Collection Time   07/03/11 11:57 AM      Component Value Range Comment   Sodium 136  135 - 145 (mEq/L)    Potassium 3.8  3.5 - 5.1 (mEq/L)    Chloride 100  96 - 112 (mEq/L)    CO2 28  19 - 32 (mEq/L)    Glucose, Bld 100 (*) 70 - 99 (mg/dL)    BUN 12  6 - 23 (mg/dL)    Creatinine, Ser 9.14  0.50 - 1.10 (mg/dL)    Calcium 9.3  8.4 - 10.5 (mg/dL)    GFR calc non Af Amer >90  >90  (mL/min)  GFR calc Af Amer >90  >90 (mL/min)   MAGNESIUM     Status: Normal   Collection Time   07/03/11  4:58 PM      Component Value Range Comment   Magnesium 1.8  1.5 - 2.5 (mg/dL)   PHOSPHORUS     Status: Normal   Collection Time   07/03/11  4:58 PM      Component Value Range Comment   Phosphorus 4.0  2.3 - 4.6 (mg/dL)   HEPATIC FUNCTION PANEL     Status: Abnormal   Collection Time   07/03/11  4:58 PM      Component Value Range Comment   Total Protein 7.6  6.0 - 8.3 (g/dL)    Albumin 3.2 (*) 3.5 - 5.2 (g/dL)    AST 10  0 - 37 (U/L)    ALT 9  0 - 35 (U/L)    Alkaline Phosphatase 84  39 - 117 (U/L)    Total Bilirubin 0.4  0.3 - 1.2 (mg/dL)    Bilirubin, Direct <1.6  0.0 - 0.3 (mg/dL)    Indirect Bilirubin NOT CALCULATED  0.3 - 0.9 (mg/dL)   TSH     Status: Abnormal   Collection Time   07/03/11  4:58 PM      Component Value Range Comment   TSH 6.224 (*) 0.350 - 4.500 (uIU/mL)   CARDIAC PANEL(CRET KIN+CKTOT+MB+TROPI)     Status: Normal   Collection Time   07/03/11  4:58 PM      Component Value Range Comment   Total CK 38  7 - 177 (U/L)    CK, MB 1.5  0.3 - 4.0 (ng/mL)    Troponin I <0.30  <0.30 (ng/mL)    Relative Index RELATIVE INDEX IS INVALID  0.0 - 2.5    HEMOGLOBIN A1C     Status: Abnormal   Collection Time   07/03/11  4:58 PM      Component Value Range Comment   Hemoglobin A1C 6.6 (*) <5.7 (%)    Mean Plasma Glucose 143 (*) <117 (mg/dL)   T4, FREE     Status: Normal   Collection Time   07/03/11  4:58 PM      Component Value Range Comment   Free T4 0.82  0.80 - 1.80 (ng/dL)   RPR     Status: Normal   Collection Time   07/03/11  4:58 PM      Component Value Range Comment   RPR NON REACTIVE  NON REACTIVE    VITAMIN B12     Status: Normal   Collection Time   07/03/11  4:58 PM      Component Value Range Comment   Vitamin B-12 580  211 - 911 (pg/mL)   BASIC METABOLIC PANEL     Status: Abnormal   Collection Time   07/04/11  6:17 AM      Component Value Range  Comment   Sodium 136  135 - 145 (mEq/L)    Potassium 4.5  3.5 - 5.1 (mEq/L)    Chloride 100  96 - 112 (mEq/L)    CO2 30  19 - 32 (mEq/L)    Glucose, Bld 104 (*) 70 - 99 (mg/dL)    BUN 11  6 - 23 (mg/dL)    Creatinine, Ser 1.09  0.50 - 1.10 (mg/dL)    Calcium 9.0  8.4 - 10.5 (mg/dL)    GFR calc non Af Amer >90  >90 (mL/min)    GFR calc Af  Amer >90  >90 (mL/min)   CBC     Status: Abnormal   Collection Time   07/04/11  6:17 AM      Component Value Range Comment   WBC 7.5  4.0 - 10.5 (K/uL)    RBC 4.71  3.87 - 5.11 (MIL/uL)    Hemoglobin 10.6 (*) 12.0 - 15.0 (g/dL)    HCT 45.4 (*) 09.8 - 46.0 (%)    MCV 75.8 (*) 78.0 - 100.0 (fL)    MCH 22.5 (*) 26.0 - 34.0 (pg)    MCHC 29.7 (*) 30.0 - 36.0 (g/dL)    RDW 11.9 (*) 14.7 - 15.5 (%)    Platelets 347  150 - 400 (K/uL)      HPI : The patient is a 51 year old woman with a past medical history significant for morbid obesity, degenerative joint disease, and complex migraine headaches, who presented to the emergency department on July 03, 2011 with a chief complaint of left-sided headache, twisted mouth, and blurred vision. In the emergency department, she was initially hypertensive with a blood pressure 185/92, but her blood pressure later normalized. She was otherwise hemodynamically stable and afebrile. The CT scan of her head revealed no acute intracranial findings. Her EKG was nonacute. Her lab data were significant only for a hemoglobin of 11.0, MCV of 75.8, and glucose of 100. She was admitted for further evaluation and management.  HOSPITAL COURSE: The patient was started on analgesics as needed for her headache. She was started on aspirin 81 mg daily. For further evaluation, a number of studies were ordered. She could not tolerate the MRI of her brain because of claustrophobia. The carotid ultrasound revealed no significant ICA stenosis. Her 2-D echocardiogram revealed preserved LV systolic function with no significant valvular abnormalities.  Her cardiac enzymes were within normal limits. Her vitamin B12 level was within normal limits. Her TSH was elevated at 6.2 and her free T4 was borderline low at 0.82. Therefore, she was given the diagnosis of hypothyroidism and started on Synthroid. Her RPR was nonreactive. Her hemoglobin A1c was 6.6. Her followup hemoglobin fell slightly to 10.6 with hydration. Further management will be deferred to Dr. Lodema Hong regarding prediabetes and anemia.  Neurologist, Dr. Gerilyn Pilgrim was consulted. The patient was well known to him. Per his assessment, she had a long history of complex migraine headaches with associated paresthesias and facial weakness/palsy. However during his assessment, she stated to him that the left-sided weakness was new as she stated to me. He agreed with aspirin and recommended an outpatient MRI of her brain to rule out a small subcortical infarct. He also recommended treating the patient's headaches symptomatically with analgesics. Physical therapy was consulted, however, the patient did not see a need for therapy as she was ambulating without difficulty.  The patient was discharged on aspirin, Percocet for pain, and Synthroid. The registered dietitian provided her with nutrition education regarding a carbohydrate modified diet and weight loss prior to discharge.  Arrangements will be made for an outpatient MRI of her brain. She will followup with Dr. Gerilyn Pilgrim shortly thereafter.    Discharge Exam: Blood pressure 141/88, pulse 79, temperature 98.3 F (36.8 C), temperature source Oral, resp. rate 18, height 5\' 2"  (1.575 m), weight 163.295 kg (360 lb), SpO2 99.00%.  Lungs: Clear to auscultation bilaterally. Heart: S1, S2, with no murmurs rubs or gallops. Abdomen: Morbidly obese, positive bowel sounds, soft, nontender, nondistended. Extremities: No pedal edema. Neurologic: She is alert and oriented x3. No dysarthria or aphasia. Left  facial droop. 5 minus over 5 left hand grip and 5 over  5 right hand grip. 5-4+ over 5 left lower extremity strength and 5 over 5 right lower strength.   Discharge Orders    Future Appointments: Provider: Department: Dept Phone: Center:   07/11/2011 10:30 AM Kerri Perches, MD [facility] [phone] [facility]     Future Orders Please Complete By Expires   Diet - low sodium heart healthy      Increase activity slowly      Discharge instructions      Comments:   MRI SCHEDULER WILL CALL YOU WITH THE APPOINTMENT.   AFTER THE MRI, PLEASE MAKE AN APPOINTMENT WITH YOUR NEUROLOGIST, DR. Gerilyn Pilgrim.      Follow-up Information    Follow up with [name], MD. Schedule an appointment as soon as possible for a visit in 1 week.   Contact information:   [address] [place] [address] [phone]          Total discharge time: 30 minutes.   Signed: Wilda Wetherell 07/05/2011, 7:30 AM

## 2011-07-07 ENCOUNTER — Ambulatory Visit
Admission: RE | Admit: 2011-07-07 | Discharge: 2011-07-07 | Disposition: A | Discharge status: Home / Self Care | Payer: No Typology Code available for payment source | Source: Ambulatory Visit | Attending: Internal Medicine | Admitting: Internal Medicine

## 2011-07-07 DIAGNOSIS — R58 Hemorrhage, not elsewhere classified: Secondary | ICD-10-CM

## 2011-07-07 MED ORDER — GADOBENATE DIMEGLUMINE 529 MG/ML IV SOLN
20.0000 mL | Freq: Once | INTRAVENOUS | Status: AC | PRN
  Administered 2011-07-07: 20 mL via INTRAVENOUS

## 2011-07-09 ENCOUNTER — Other Ambulatory Visit: Payer: Self-pay

## 2011-07-10 ENCOUNTER — Ambulatory Visit (INDEPENDENT_AMBULATORY_CARE_PROVIDER_SITE_OTHER): Payer: Self-pay | Admitting: Family Medicine

## 2011-07-10 ENCOUNTER — Encounter: Payer: Self-pay | Admitting: Family Medicine

## 2011-07-10 VITALS — BP 112/80 | HR 67 | Resp 16 | Ht 62.0 in | Wt 372.8 lb

## 2011-07-10 DIAGNOSIS — R7309 Other abnormal glucose: Secondary | ICD-10-CM

## 2011-07-10 DIAGNOSIS — R5383 Other fatigue: Secondary | ICD-10-CM

## 2011-07-10 DIAGNOSIS — E039 Hypothyroidism, unspecified: Secondary | ICD-10-CM

## 2011-07-10 DIAGNOSIS — R5381 Other malaise: Secondary | ICD-10-CM

## 2011-07-10 DIAGNOSIS — R7303 Prediabetes: Secondary | ICD-10-CM

## 2011-07-10 DIAGNOSIS — R29810 Facial weakness: Secondary | ICD-10-CM

## 2011-07-10 NOTE — Progress Notes (Signed)
  Subjective:    Patient ID: [name], female    DOB: 09-Sep-1960, 51 y.o.   MRN: [id number]  HPI Pt in for f/u of recent hospitalization for acute left facial droop. Unable to image in hospital, out pt imaging arranged. New diagnosis of hypothyroidism , started on medication and will need f/u in the next 4 monhts. Asymptomatic for thyroid disease   Review of Systems See HPI Denies recent fever or chills. Denies sinus pressure, nasal congestion, ear pain or sore throat. Denies chest congestion, productive cough or wheezing. Denies chest pains, palpitations and leg swelling Denies abdominal pain, nausea, vomiting,diarrhea or constipation.   Denies dysuria, frequency, hesitancy or incontinence. Chronic knee pain Denies uncontrolled  depression, anxiety or insomnia. Denies skin break down or rash.        Objective:   Physical Exam  Patient alert and oriented and in no cardiopulmonary distress.  HEENT:  facial asymmetry, EOMI, no sinus tenderness,  oropharynx pink and moist.  Neck supple no adenopathy.  Chest: Clear to auscultation bilaterally.  CVS: S1, S2 no murmurs, no S3.  ABD: Soft non tender. Bowel sounds normal.  Ext: No edema  MS: Adequate ROM spine, shoulders, hips and knees.  Skin: Intact, no ulcerations or rash noted.  Psych: Good eye contact, normal affect. Memory intact not anxious or depressed appearing.  CNS: CN 2-12 intact,left facial weakness with droop,normal power in all 4 extremities      Assessment & Plan:

## 2011-07-10 NOTE — Patient Instructions (Signed)
F/u in 4 month.  TSH and HBA1C in 4 months on day of visit  Please work on dietary changes to improve blood sugars and start regular exercise

## 2011-07-11 ENCOUNTER — Ambulatory Visit: Payer: Self-pay | Admitting: Family Medicine

## 2011-07-14 NOTE — Assessment & Plan Note (Signed)
Recently hospitalized and observed for eval for possible CVA, ha happened i the past with svere migraine, has appt with neurology upcoming

## 2011-07-14 NOTE — Assessment & Plan Note (Signed)
Pt to work on dietary  change and weight loss, for blood sugar management and control

## 2011-07-14 NOTE — Assessment & Plan Note (Signed)
New diagnosis, pt to have f/u on tsh in 4 month

## 2011-07-30 NOTE — Progress Notes (Signed)
UR Chart Review Completed  

## 2011-09-06 ENCOUNTER — Ambulatory Visit (INDEPENDENT_AMBULATORY_CARE_PROVIDER_SITE_OTHER): Payer: Self-pay | Admitting: Family Medicine

## 2011-09-06 ENCOUNTER — Encounter: Payer: Self-pay | Admitting: Family Medicine

## 2011-09-06 VITALS — BP 144/80 | HR 73 | Resp 18 | Ht 62.0 in | Wt 375.1 lb

## 2011-09-06 DIAGNOSIS — M25579 Pain in unspecified ankle and joints of unspecified foot: Secondary | ICD-10-CM

## 2011-09-06 DIAGNOSIS — R7303 Prediabetes: Secondary | ICD-10-CM

## 2011-09-06 DIAGNOSIS — F329 Major depressive disorder, single episode, unspecified: Secondary | ICD-10-CM

## 2011-09-06 DIAGNOSIS — R7309 Other abnormal glucose: Secondary | ICD-10-CM

## 2011-09-06 DIAGNOSIS — E039 Hypothyroidism, unspecified: Secondary | ICD-10-CM

## 2011-09-06 DIAGNOSIS — M25571 Pain in right ankle and joints of right foot: Secondary | ICD-10-CM

## 2011-09-06 LAB — URIC ACID: Uric Acid, Serum: 6.3 mg/dL (ref 2.4–7.0)

## 2011-09-06 MED ORDER — METHYLPREDNISOLONE ACETATE 80 MG/ML IJ SUSP
80.0000 mg | Freq: Once | INTRAMUSCULAR | Status: AC
  Administered 2011-09-06: 80 mg via INTRAMUSCULAR

## 2011-09-06 MED ORDER — KETOROLAC TROMETHAMINE 60 MG/2ML IJ SOLN
60.0000 mg | Freq: Once | INTRAMUSCULAR | Status: AC
  Administered 2011-09-06: 60 mg via INTRAMUSCULAR

## 2011-09-06 MED ORDER — FLUOXETINE HCL 20 MG PO TABS: 20.0000 mg | ORAL_TABLET | Freq: Every day | ORAL | Status: DC

## 2011-09-06 MED ORDER — PREDNISONE (PAK) 5 MG PO TABS: 5.0000 mg | ORAL_TABLET | ORAL | Status: DC

## 2011-09-06 MED ORDER — INDOMETHACIN 25 MG PO CAPS: ORAL_CAPSULE | ORAL | Status: DC

## 2011-09-06 NOTE — Assessment & Plan Note (Signed)
Encouraged low carb diet and weight loss, will f/u lab at  next visit

## 2011-09-06 NOTE — Assessment & Plan Note (Signed)
Continue replacement and re eval lab in several weeks, no c/o intolerance

## 2011-09-06 NOTE — Progress Notes (Signed)
  Subjective:    Patient ID: [name], female    DOB: [birth year], 51 y.o.   MRN: [id number]  HPI 2 day h/o right ankle pain and swelling, no trauma , first episode. Father has had gout. States she is becoming overwhelmed, depressed, tearful, not suicidal or homicidal, has made appt to resume psychotherapy, interested in resuming medication also   Review of Systems See HPI Denies recent fever or chills. Denies sinus pressure, nasal congestion, ear pain or sore throat. Denies chest congestion, productive cough or wheezing. Denies chest pains, palpitations and leg swelling Denies abdominal pain, nausea, vomiting,diarrhea or constipation.   Denies dysuria, frequency, hesitancy or incontinence.  Denies headaches, seizures, numbness, or tingling.  Denies skin break down or rash.        Objective:   Physical Exam Patient alert and oriented and in no cardiopulmonary distress.  HEENT: No facial asymmetry, EOMI, no sinus tenderness,  oropharynx pink and moist.  Neck supple no adenopathy.  Chest: Clear to auscultation bilaterally.  CVS: S1, S2 no murmurs, no S3.  ABD: Soft non tender. Bowel sounds normal.  Ext: No edema  MS: Adequate ROM spine, shoulders, hips and knees.Decreased ROM right ankle which is swollen , warm and tender  Skin: Intact, no ulcerations or rash noted.  Psych: Good eye contact, normal affect. Memory intact , tearful and depressed appearing.  CNS: CN 2-12 intact, power, tone and sensation normal throughout.        Assessment & Plan:

## 2011-09-06 NOTE — Assessment & Plan Note (Signed)
Clinically this is gout,treated as such, will obtain uric acid level

## 2011-09-06 NOTE — Patient Instructions (Addendum)
F/u as before  You are being treated for acute gout   Uric acid level today  Toradol and depo medrol in the office and medication sent to the pharmacy   Start fluoxetine for depression

## 2011-09-06 NOTE — Assessment & Plan Note (Signed)
Deteriorated, not suicidal or homicidal. Start fluoxetine and see therapist

## 2011-09-12 ENCOUNTER — Ambulatory Visit (HOSPITAL_COMMUNITY): Payer: Medicaid Other | Admitting: Psychiatry

## 2011-09-30 ENCOUNTER — Other Ambulatory Visit: Payer: Self-pay

## 2011-09-30 ENCOUNTER — Other Ambulatory Visit: Payer: Self-pay | Admitting: Family Medicine

## 2011-09-30 MED ORDER — LEVOTHYROXINE SODIUM 25 MCG PO TABS: 25.0000 ug | ORAL_TABLET | Freq: Every day | ORAL | Status: DC

## 2011-10-25 NOTE — Telephone Encounter (Signed)
Nurse took care of this °

## 2011-11-05 ENCOUNTER — Ambulatory Visit (INDEPENDENT_AMBULATORY_CARE_PROVIDER_SITE_OTHER): Payer: Self-pay | Admitting: Family Medicine

## 2011-11-05 ENCOUNTER — Encounter: Payer: Self-pay | Admitting: Family Medicine

## 2011-11-05 VITALS — BP 142/90 | HR 69 | Resp 16 | Ht 62.0 in | Wt 379.0 lb

## 2011-11-05 DIAGNOSIS — E039 Hypothyroidism, unspecified: Secondary | ICD-10-CM

## 2011-11-05 DIAGNOSIS — R9431 Abnormal electrocardiogram [ECG] [EKG]: Secondary | ICD-10-CM

## 2011-11-05 DIAGNOSIS — M7989 Other specified soft tissue disorders: Secondary | ICD-10-CM

## 2011-11-05 DIAGNOSIS — R0602 Shortness of breath: Secondary | ICD-10-CM

## 2011-11-05 LAB — CBC
Hemoglobin: 11.1 g/dL — ABNORMAL LOW (ref 12.0–15.0)
MCH: 22.7 pg — ABNORMAL LOW (ref 26.0–34.0)
Platelets: 423 10*3/uL — ABNORMAL HIGH (ref 150–400)
RBC: 4.88 MIL/uL (ref 3.87–5.11)
WBC: 6.5 10*3/uL (ref 4.0–10.5)

## 2011-11-05 LAB — BASIC METABOLIC PANEL
BUN: 13 mg/dL (ref 6–23)
Calcium: 8.9 mg/dL (ref 8.4–10.5)
Creat: 0.61 mg/dL (ref 0.50–1.10)

## 2011-11-05 LAB — T4: T4, Total: 6.3 ug/dL (ref 5.0–12.5)

## 2011-11-05 LAB — TSH: TSH: 4.891 u[IU]/mL — ABNORMAL HIGH (ref 0.350–4.500)

## 2011-11-05 LAB — T3, FREE: T3, Free: 2.9 pg/mL (ref 2.3–4.2)

## 2011-11-05 MED ORDER — FUROSEMIDE 20 MG PO TABS: 20.0000 mg | ORAL_TABLET | Freq: Every day | ORAL | Status: DC

## 2011-11-05 NOTE — Assessment & Plan Note (Signed)
Unchanged, she has started walking again

## 2011-11-05 NOTE — Assessment & Plan Note (Signed)
Per above, add duretic

## 2011-11-05 NOTE — Patient Instructions (Addendum)
Start lasix 20mg  once a day for fluid Continue to exercise, any weight loss will help  Get the labs done You will be referred to cardiology for Echo test F/U 2 months Dr.Simpson

## 2011-11-05 NOTE — Assessment & Plan Note (Signed)
The change in her dyspnea is more concerning. I will have her referred to cardiology for echocardiogram. I will empirically start Lasix 20 mg for fluid retention. BNP, metabolic panel, CBC and TSH will be obtained, to rule out other causes contributing to shortness of breath. Of course her morbid obesity is also a component

## 2011-11-05 NOTE — Progress Notes (Signed)
  Subjective:    Patient ID: [name], female    DOB: 10-16-1960, 51 y.o.   MRN: [id number]  HPI Patient presents with shortness of breath and leg swelling for the past 2-3 weeks. She is a heavy feeling in her chest when she lies down especially at night and gets more short of breath she is to sleep on 2 pillows which is new for her. She does have history of morbid obesity and has shortness of breath with exertion however it has changed in the past 2-3 weeks. She denies sharp chest pain that is radiating, nausea or vomiting. She denies any upper respiratory symptoms. Last week she felt swollen all over and took one of her family members fluid pills in which she lost 5 pounds of fluid per report. She has no known cardiac history  Review of Systems   GEN- denies fatigue, fever, weight loss,weakness, recent illness HEENT- denies eye drainage, change in vision, nasal discharge, CVS- denies chest pain, palpitations RESP- denies SOB, cough, wheeze ABD- denies N/V, change in stools, abd pain Neuro- denies headache, dizziness, syncope, seizure activity      Objective:   Physical Exam GEN- NAD, alert and oriented x3 HEENT- PERRL, EOMI, non injected sclera, pink conjunctiva, MMM, oropharynx clear Neck- Supple,no JVD noted CVS- RRR, no murmur RESP-CTAB, decreased at bases ABD-NABS,soft,NT,ND EXT- 1+ pedal  edema Pulses- Radial, DP- 2+  EKG-NSR, fusion complexes, inverted t waves, compared to EKG April 2013       Assessment & Plan:

## 2011-11-05 NOTE — Assessment & Plan Note (Signed)
Check TFT, on replacement hormone

## 2011-11-06 LAB — BRAIN NATRIURETIC PEPTIDE: Brain Natriuretic Peptide: 62.5 pg/mL (ref 0.0–100.0)

## 2011-11-12 ENCOUNTER — Ambulatory Visit: Payer: Self-pay | Admitting: Family Medicine

## 2011-11-25 ENCOUNTER — Encounter: Payer: Self-pay | Admitting: Cardiology

## 2011-11-25 ENCOUNTER — Ambulatory Visit (INDEPENDENT_AMBULATORY_CARE_PROVIDER_SITE_OTHER): Payer: Self-pay | Admitting: Cardiology

## 2011-11-25 VITALS — BP 138/80 | HR 78 | Ht 62.0 in | Wt 370.2 lb

## 2011-11-25 DIAGNOSIS — R0600 Dyspnea, unspecified: Secondary | ICD-10-CM

## 2011-11-25 DIAGNOSIS — R7309 Other abnormal glucose: Secondary | ICD-10-CM

## 2011-11-25 DIAGNOSIS — R0609 Other forms of dyspnea: Secondary | ICD-10-CM

## 2011-11-25 DIAGNOSIS — R55 Syncope and collapse: Secondary | ICD-10-CM | POA: Insufficient documentation

## 2011-11-25 DIAGNOSIS — D649 Anemia, unspecified: Secondary | ICD-10-CM

## 2011-11-25 DIAGNOSIS — R6889 Other general symptoms and signs: Secondary | ICD-10-CM

## 2011-11-25 DIAGNOSIS — E039 Hypothyroidism, unspecified: Secondary | ICD-10-CM

## 2011-11-25 DIAGNOSIS — F32A Depression, unspecified: Secondary | ICD-10-CM | POA: Insufficient documentation

## 2011-11-25 DIAGNOSIS — G43909 Migraine, unspecified, not intractable, without status migrainosus: Secondary | ICD-10-CM | POA: Insufficient documentation

## 2011-11-25 DIAGNOSIS — R0989 Other specified symptoms and signs involving the circulatory and respiratory systems: Secondary | ICD-10-CM

## 2011-11-25 DIAGNOSIS — Z9289 Personal history of other medical treatment: Secondary | ICD-10-CM

## 2011-11-25 DIAGNOSIS — R7303 Prediabetes: Secondary | ICD-10-CM

## 2011-11-25 DIAGNOSIS — Z9189 Other specified personal risk factors, not elsewhere classified: Secondary | ICD-10-CM

## 2011-11-25 DIAGNOSIS — F329 Major depressive disorder, single episode, unspecified: Secondary | ICD-10-CM | POA: Insufficient documentation

## 2011-11-25 DIAGNOSIS — R030 Elevated blood-pressure reading, without diagnosis of hypertension: Secondary | ICD-10-CM

## 2011-11-25 NOTE — Progress Notes (Signed)
Patient ID: [name], female   DOB: 1961/01/18, 51 y.o.   MRN: [id number]  HPI: [name] woman with no known cardiac disease seen for an initial Cardiology assessment at the kind request of Dr. Jeanice Moore for evaluation of possible congestive heart failure.  Over the past few weeks, [name] has experienced dyspnea on exertion with decreased exercise tolerance.  She has been in the habit of exercising regularly, but had decreased following left TKA.  Decline in exercise capacity has been accompanied by increased pedal edema.  She was started on diuretics recently, but has not noted substantial improvement.  She denies chest discomfort, orthopnea or PND.  She was tested in the past for sleep apnea, reportedly with negative results.  Current Outpatient Prescriptions on File Prior to Visit  Medication Sig Dispense Refill  . aspirin EC 81 MG EC tablet Take 1 tablet (81 mg total) by mouth daily.      . furosemide (LASIX) 20 MG tablet Take 1 tablet (20 mg total) by mouth daily.  30 tablet  3  . ibuprofen (ADVIL,MOTRIN) 800 MG tablet Take 800 mg by mouth every 8 (eight) hours as needed.      Marland Kitchen levothyroxine (SYNTHROID, LEVOTHROID) 25 MCG tablet Take 1 tablet (25 mcg total) by mouth daily before breakfast. For hypothyroidism  30 tablet  2   Allergies  Allergen Reactions  . Latex Rash    Past Medical History  Diagnosis Date  . Morbid obesity   . Migraine headache   . Depression [facility] admission in 2001 & 2010  . Degenerative joint disease     Left TKA in 07/2009-Dr. Landau  . Prediabetes   . Chronic back pain   . Hypothyroidism 07/04/2011    Left lobectomy in 2004  . Syncope     with palpitations; [facility] in 2008 with normal echo    Past Surgical History  Procedure Date  . Abdominal hysterectomy 1995     no oophorectomy  . Cholecystectomy, laparoscopic 2007    Dr. Lovell Sheehan  . Thyroidectomy, partial 2004    Left lobectomy - large benign  nodule  . Knee arthroscopy 1995    Dr. Cleophas Dunker   . Total knee arthroplasty 07/2009    Left knee , Dr. Dion Saucier   . Carpal tunnel release 1991    Bilateral  . Craniectomy suboccipital w/ cervical laminectomy / chiari 2007    Repair of Arnold-Chiari malformation  . Tonsillectomy   . Cesarean section     X2    Family History  Problem Relation Age of Onset  . Diabetes Mother   . Diabetes Father     emphsema   . Hyperlipidemia Father     History   Social History  . Marital Status: Divorced    Spouse Name: N/A    Number of Children: 3  . Years of Education: N/A   Occupational History  . Employed seeking disabilty 2012    Social History Main Topics  . Smoking status: Never Smoker   . Smokeless tobacco: Not on file  . Alcohol Use: No  . Drug Use: No  . Sexually Active: Not on file   Other Topics Concern  . Not on file   Social History Narrative  . No narrative on file    ROS:  Positive for urinary frequency and urgency.  All other systems reviewed and are negative.  PHYSICAL EXAM: BP 138/80  Pulse 78  Ht 5\' 2"  (1.575  m)  Wt 167.944 kg (370 lb 4 oz)  BMI 67.72 kg/m2  General-Well-developed; no acute distress Body Habitus-proportionate weight and height HEENT-[place]/AT; PERRL; EOM intact; conjunctiva and lids nl Neck-No JVD; no carotid bruits Endocrine-No thyromegaly Lungs-Clear lung fields; resonant percussion; normal I-to-E ratio Cardiovascular- normal PMI; normal S1 and S2 Abdomen-BS normal; soft and non-tender without masses or organomegaly Musculoskeletal-No deformities, cyanosis or clubbing Neurologic-Nl cranial nerves; symmetric strength and tone Skin- Warm, no significant lesions Extremities-1+ distal pulses; Trace edema  EKG:  Normal sinus rhythm; borderline low voltage; otherwise unremarkable   ASSESSMENT AND PLAN:  [place] Bing, MD 11/25/2011 4:35 PM

## 2011-11-25 NOTE — Patient Instructions (Addendum)
Your physician recommends that you schedule a follow-up appointment in: 6 months  A chest x-ray takes a picture of the organs and structures inside the chest, including the heart, lungs, and blood vessels. This test can show several things, including, whether the heart is enlarges; whether fluid is building up in the lungs; and whether pacemaker / defibrillator leads are still in place.  Your physician recommends that you return for lab work in: 2 weeks

## 2011-11-26 ENCOUNTER — Encounter: Payer: Self-pay | Admitting: Cardiology

## 2011-11-26 ENCOUNTER — Encounter: Payer: Self-pay | Admitting: *Deleted

## 2011-11-26 DIAGNOSIS — Z9289 Personal history of other medical treatment: Secondary | ICD-10-CM | POA: Insufficient documentation

## 2011-11-26 DIAGNOSIS — I10 Essential (primary) hypertension: Secondary | ICD-10-CM

## 2011-11-26 DIAGNOSIS — R6889 Other general symptoms and signs: Secondary | ICD-10-CM | POA: Insufficient documentation

## 2011-11-26 HISTORY — DX: Essential (primary) hypertension: I10

## 2011-11-26 NOTE — Assessment & Plan Note (Signed)
No evidence for cardiac or pulmonary disease.  Symptoms are likely related to physical inactivity and obesity.  Chest x-ray will be obtained.  Patient advised to gradually attempt to increase daily exercise.

## 2011-11-26 NOTE — Assessment & Plan Note (Signed)
Patient has considered bariatric surgery in the past, but never seriously.  This option should be further explored.

## 2011-11-26 NOTE — Assessment & Plan Note (Signed)
Diabetes is minimal and would likely resolve with significant weight loss.  No specific therapy required at present.

## 2011-11-26 NOTE — Assessment & Plan Note (Signed)
No recurrence of loss of consciousness over the past 5 years.  Initial episode appears to have been an isolated event and not reflective of significant underlying pathology.

## 2011-11-26 NOTE — Assessment & Plan Note (Signed)
Patiently recently euthyroid by laboratory testing.

## 2011-11-26 NOTE — Assessment & Plan Note (Signed)
Mild anemia is not contributing to current symptoms, but may reflect a significant underlying condition, most likely iron deficiency.  Appropriate testing is advised.

## 2011-12-06 ENCOUNTER — Other Ambulatory Visit: Payer: Self-pay | Admitting: *Deleted

## 2011-12-06 DIAGNOSIS — R0600 Dyspnea, unspecified: Secondary | ICD-10-CM

## 2011-12-13 ENCOUNTER — Encounter: Payer: Self-pay | Admitting: *Deleted

## 2012-01-05 ENCOUNTER — Other Ambulatory Visit: Payer: Self-pay | Admitting: Family Medicine

## 2012-01-16 ENCOUNTER — Other Ambulatory Visit (HOSPITAL_COMMUNITY)
Admission: RE | Admit: 2012-01-16 | Discharge: 2012-01-16 | Disposition: A | Discharge status: Home / Self Care | Payer: Medicaid Other | Source: Ambulatory Visit | Attending: Family Medicine | Admitting: Family Medicine

## 2012-01-16 ENCOUNTER — Encounter: Payer: Self-pay | Admitting: Family Medicine

## 2012-01-16 ENCOUNTER — Other Ambulatory Visit (HOSPITAL_COMMUNITY)
Admission: RE | Admit: 2012-01-16 | Discharge: 2012-01-16 | Disposition: A | Discharge status: Home / Self Care | Payer: Self-pay | Source: Ambulatory Visit | Attending: Family Medicine | Admitting: Family Medicine

## 2012-01-16 ENCOUNTER — Ambulatory Visit (INDEPENDENT_AMBULATORY_CARE_PROVIDER_SITE_OTHER): Payer: Self-pay | Admitting: Family Medicine

## 2012-01-16 VITALS — BP 128/76 | HR 77 | Resp 18 | Ht 62.0 in | Wt 374.0 lb

## 2012-01-16 DIAGNOSIS — Z1151 Encounter for screening for human papillomavirus (HPV): Secondary | ICD-10-CM | POA: Insufficient documentation

## 2012-01-16 DIAGNOSIS — F329 Major depressive disorder, single episode, unspecified: Secondary | ICD-10-CM

## 2012-01-16 DIAGNOSIS — Z23 Encounter for immunization: Secondary | ICD-10-CM

## 2012-01-16 DIAGNOSIS — D649 Anemia, unspecified: Secondary | ICD-10-CM

## 2012-01-16 DIAGNOSIS — F32A Depression, unspecified: Secondary | ICD-10-CM

## 2012-01-16 DIAGNOSIS — Z113 Encounter for screening for infections with a predominantly sexual mode of transmission: Secondary | ICD-10-CM | POA: Insufficient documentation

## 2012-01-16 DIAGNOSIS — Z0000 Encounter for general adult medical examination without abnormal findings: Secondary | ICD-10-CM

## 2012-01-16 DIAGNOSIS — E039 Hypothyroidism, unspecified: Secondary | ICD-10-CM

## 2012-01-16 DIAGNOSIS — N760 Acute vaginitis: Secondary | ICD-10-CM

## 2012-01-16 DIAGNOSIS — Z1211 Encounter for screening for malignant neoplasm of colon: Secondary | ICD-10-CM

## 2012-01-16 DIAGNOSIS — R7309 Other abnormal glucose: Secondary | ICD-10-CM

## 2012-01-16 DIAGNOSIS — R7303 Prediabetes: Secondary | ICD-10-CM

## 2012-01-16 DIAGNOSIS — Z01419 Encounter for gynecological examination (general) (routine) without abnormal findings: Secondary | ICD-10-CM | POA: Insufficient documentation

## 2012-01-16 LAB — CBC
MCV: 73.3 fL — ABNORMAL LOW (ref 78.0–100.0)
Platelets: 411 10*3/uL — ABNORMAL HIGH (ref 150–400)
RDW: 14.9 % (ref 11.5–15.5)
WBC: 8.8 10*3/uL (ref 4.0–10.5)

## 2012-01-16 LAB — HEMOGLOBIN A1C: Mean Plasma Glucose: 134 mg/dL — ABNORMAL HIGH (ref <117)

## 2012-01-16 LAB — IRON: Iron: 31 ug/dL — ABNORMAL LOW (ref 42–145)

## 2012-01-16 LAB — BASIC METABOLIC PANEL
Chloride: 102 mEq/L (ref 96–112)
Creat: 0.69 mg/dL (ref 0.50–1.10)
Sodium: 136 mEq/L (ref 135–145)

## 2012-01-16 LAB — TSH: TSH: 4.299 u[IU]/mL (ref 0.350–4.500)

## 2012-01-16 NOTE — Patient Instructions (Addendum)
Welcome to medicare in 4 month  Flu vaccine today.  Fasting cbc, iron, lipid, chem 7, HBA1C , TSH and vit D today   It is important that you exercise regularly at least 30 minutes 5 times a week. If you develop chest pain, have severe difficulty breathing, or feel very tired, stop exercising immediately and seek medical attention    A healthy diet is rich in fruit, vegetables and whole grains. Poultry fish, nuts and beans are a healthy choice for protein rather then red meat. A low sodium diet and drinking 64 ounces of water daily is generally recommended. Oils and sweet should be limited. Carbohydrates especially for those who are diabetic or overweight, should be limited to 34-45 gram per meal. It is important to eat on a regular schedule, at least 3 times daily. Snacks should be primarily fruits, vegetables or nuts.

## 2012-01-16 NOTE — Progress Notes (Signed)
  Subjective:    Patient ID: [name], female    DOB: 1960/08/18, 51 y.o.   MRN: [id number]  HPI The PT is here for annual exam and re-evaluation of chronic medical conditions, medication management and review of any available recent lab and radiology data.  Preventive health is updated, specifically  Cancer screening and Immunization.   C/o malodorous vag d/c and wants this checked      Review of Systems See HPI Denies recent fever or chills. Denies sinus pressure, nasal congestion, ear pain or sore throat. Denies chest congestion, productive cough or wheezing. Denies chest pains, palpitations and leg swelling Denies abdominal pain, nausea, vomiting,diarrhea or constipation.   Denies dysuria, frequency, hesitancy or incontinence. Denies joint pain, swelling and limitation in mobility. Denies headaches, seizures, numbness, or tingling. Denies uncontrolled  depression, anxiety or insomnia. Denies skin break down or rash.        Objective:   Physical Exam Pleasant morbidly obese female, alert and oriented x 3, in no cardio-pulmonary distress. Afebrile. HEENT No facial trauma or asymetry. Sinuses non tender.  EOMI, PERTL, fundoscopic exam no hemorhage or exudate.  External ears normal, tympanic membranes clear. Oropharynx moist, no exudate, poor dentition. Neck: supple, no adenopathy,JVD or thyromegaly.No bruits.  Chest: Clear to ascultation bilaterally.No crackles or wheezes. Non tender to palpation  Breast: No asymetry,no masses. No nipple discharge or inversion. No axillary or supraclavicular adenopathy  Cardiovascular system; Heart sounds normal,  S1 and  S2 ,no S3.  No murmur, or thrill. Apical beat not displaced Peripheral pulses normal.  Abdomen: Soft,obese, non tender, no organomegaly or masses. No bruits. Bowel sounds normal. No guarding, tenderness or rebound.  Rectal:  No mass. Guaiac negative stool.  GU: External genitalia normal. No  lesions. Vaginal canal normal.fishy  discharge. Uterus absent, no adnexal masses, no  adnexal tenderness.  Musculoskeletal exam: Full ROM of spine, hips , shoulders and knees. No deformity ,swelling or crepitus noted. No muscle wasting or atrophy.   Neurologic: Cranial nerves 2 to 12 intact. Power, tone ,sensation and reflexes normal throughout. No disturbance in gait. No tremor.  Skin: Intact, no ulceration, erythema , scaling or rash noted. Pigmentation normal throughout  Psych; Normal mood and affect. Judgement and concentration normal        Assessment & Plan:

## 2012-01-17 ENCOUNTER — Other Ambulatory Visit: Payer: Self-pay | Admitting: Family Medicine

## 2012-01-18 DIAGNOSIS — Z0000 Encounter for general adult medical examination without abnormal findings: Secondary | ICD-10-CM | POA: Insufficient documentation

## 2012-01-18 NOTE — Assessment & Plan Note (Signed)
Deteriorated. Patient re-educated about  the importance of commitment to a  minimum of 150 minutes of exercise per week. The importance of healthy food choices with portion control discussed. Encouraged to start a food diary, count calories and to consider  joining a support group. Sample diet sheets offered. Goals set by the patient for the next several months.    

## 2012-01-18 NOTE — Assessment & Plan Note (Signed)
Improved, no meds at this time

## 2012-01-18 NOTE — Assessment & Plan Note (Signed)
Pelvic , breast and general medical exam carried out. Specimens from vaginal wall sent based on complaints voiced and exam. Safe sex counseling also done. Pt encouraged to be consistent in weight loss efforts to improve her health Flu vaccine administered

## 2012-01-18 NOTE — Assessment & Plan Note (Signed)
Controlled, no change in medication  

## 2012-01-18 NOTE — Assessment & Plan Note (Signed)
Improved, currently on no medication 

## 2012-05-14 ENCOUNTER — Ambulatory Visit: Payer: Self-pay | Admitting: Family Medicine

## 2012-05-26 ENCOUNTER — Encounter: Payer: Self-pay | Admitting: Family Medicine

## 2012-05-26 ENCOUNTER — Ambulatory Visit (INDEPENDENT_AMBULATORY_CARE_PROVIDER_SITE_OTHER): Payer: Medicare Other | Admitting: Family Medicine

## 2012-05-26 VITALS — BP 160/82 | HR 88 | Resp 18 | Ht 62.0 in | Wt 375.1 lb

## 2012-05-26 DIAGNOSIS — I10 Essential (primary) hypertension: Secondary | ICD-10-CM

## 2012-05-26 DIAGNOSIS — R110 Nausea: Secondary | ICD-10-CM

## 2012-05-26 DIAGNOSIS — G43909 Migraine, unspecified, not intractable, without status migrainosus: Secondary | ICD-10-CM

## 2012-05-26 MED ORDER — PROPRANOLOL HCL 10 MG PO TABS: 10.0000 mg | ORAL_TABLET | Freq: Two times a day (BID) | ORAL | Status: DC

## 2012-05-26 MED ORDER — PROMETHAZINE HCL 25 MG/ML IJ SOLN
25.0000 mg | Freq: Once | INTRAMUSCULAR | Status: AC
  Administered 2012-05-26: 25 mg via INTRAMUSCULAR

## 2012-05-26 MED ORDER — KETOROLAC TROMETHAMINE 60 MG/2ML IJ SOLN
60.0000 mg | Freq: Once | INTRAMUSCULAR | Status: AC
  Administered 2012-05-26: 60 mg via INTRAMUSCULAR

## 2012-05-26 NOTE — Patient Instructions (Addendum)
Start propranolol twice a day for both migraine and blood pressure Use the imitrex you have at home Keep previous f/u appointment

## 2012-05-27 ENCOUNTER — Encounter: Payer: Self-pay | Admitting: Family Medicine

## 2012-05-27 ENCOUNTER — Encounter (HOSPITAL_COMMUNITY): Payer: Self-pay

## 2012-05-27 ENCOUNTER — Emergency Department (HOSPITAL_COMMUNITY)
Admission: EM | Admit: 2012-05-27 | Discharge: 2012-05-27 | Disposition: A | Discharge status: Home / Self Care | Payer: Medicare Other | Attending: Emergency Medicine | Admitting: Emergency Medicine

## 2012-05-27 DIAGNOSIS — R110 Nausea: Secondary | ICD-10-CM | POA: Insufficient documentation

## 2012-05-27 DIAGNOSIS — Z79899 Other long term (current) drug therapy: Secondary | ICD-10-CM | POA: Insufficient documentation

## 2012-05-27 DIAGNOSIS — Z8659 Personal history of other mental and behavioral disorders: Secondary | ICD-10-CM | POA: Insufficient documentation

## 2012-05-27 DIAGNOSIS — Z7982 Long term (current) use of aspirin: Secondary | ICD-10-CM | POA: Insufficient documentation

## 2012-05-27 DIAGNOSIS — G43909 Migraine, unspecified, not intractable, without status migrainosus: Secondary | ICD-10-CM | POA: Insufficient documentation

## 2012-05-27 DIAGNOSIS — E039 Hypothyroidism, unspecified: Secondary | ICD-10-CM | POA: Insufficient documentation

## 2012-05-27 DIAGNOSIS — Z8739 Personal history of other diseases of the musculoskeletal system and connective tissue: Secondary | ICD-10-CM | POA: Insufficient documentation

## 2012-05-27 DIAGNOSIS — H538 Other visual disturbances: Secondary | ICD-10-CM | POA: Insufficient documentation

## 2012-05-27 DIAGNOSIS — G8929 Other chronic pain: Secondary | ICD-10-CM | POA: Insufficient documentation

## 2012-05-27 DIAGNOSIS — R51 Headache: Secondary | ICD-10-CM

## 2012-05-27 DIAGNOSIS — H53149 Visual discomfort, unspecified: Secondary | ICD-10-CM | POA: Insufficient documentation

## 2012-05-27 MED ORDER — METOCLOPRAMIDE HCL 10 MG PO TABS: 10.0000 mg | ORAL_TABLET | Freq: Three times a day (TID) | ORAL | Status: DC | PRN

## 2012-05-27 MED ORDER — METOCLOPRAMIDE HCL 5 MG/ML IJ SOLN
10.0000 mg | Freq: Once | INTRAMUSCULAR | Status: AC
  Administered 2012-05-27: 10 mg via INTRAVENOUS
  Filled 2012-05-27: qty 2

## 2012-05-27 MED ORDER — HYDROMORPHONE HCL PF 1 MG/ML IJ SOLN
1.0000 mg | Freq: Once | INTRAMUSCULAR | Status: AC
  Administered 2012-05-27: 1 mg via INTRAVENOUS
  Filled 2012-05-27: qty 1

## 2012-05-27 MED ORDER — KETOROLAC TROMETHAMINE 30 MG/ML IJ SOLN
30.0000 mg | Freq: Once | INTRAMUSCULAR | Status: AC
  Administered 2012-05-27: 30 mg via INTRAVENOUS
  Filled 2012-05-27: qty 1

## 2012-05-27 MED ORDER — SODIUM CHLORIDE 0.9 % IV BOLUS (SEPSIS)
1000.0000 mL | Freq: Once | INTRAVENOUS | Status: AC
  Administered 2012-05-27: 1000 mL via INTRAVENOUS

## 2012-05-27 MED ORDER — NAPROXEN 500 MG PO TABS: 500.0000 mg | ORAL_TABLET | Freq: Two times a day (BID) | ORAL | Status: DC

## 2012-05-27 NOTE — Assessment & Plan Note (Signed)
Given abortive treatment  In office, toradol and phenergan, trial of imitrex

## 2012-05-27 NOTE — ED Notes (Signed)
Patient states she wears glasses occasionally.

## 2012-05-27 NOTE — ED Notes (Addendum)
Pt reports history of migraine headaches.  Reports has had headache x 1 week.  Went to pcp's office yesterday and was given 2 shots but they did not help.  BP 2 days ago was 198/110.   PCP instructed pt to take her fluid pill to help decrease her bp.  Pt said 9 years had headaches and after having neck surgery her headaches went away.

## 2012-05-27 NOTE — ED Notes (Signed)
Patient would like something to drink at this time. 

## 2012-05-27 NOTE — Assessment & Plan Note (Signed)
Will start on propranolol 10mg  BID this may help BP as well as migraines, recheck with PCP in 2 weeks

## 2012-05-27 NOTE — ED Provider Notes (Signed)
History     This chart was scribed for Vickie Roller, MD, MD by Smitty Pluck, ED Scribe. The patient was seen in room APA01/APA01 and the patient's care was started at 2:56 PM.   CSN: [id number]  Arrival date & time 05/27/12  1421      Chief Complaint  Patient presents with  . Headache     The history is provided by the patient and medical records. No language interpreter was used.   [name] is a 52 y.o. female with hx of migraines who presents to the Emergency Department complaining of constant, moderate left parietal occipital headache radiating to left neck onset 1 week ago. Pt was given Toradol and phenergan PCP office 1 day ago without relief. Pt has taken ibuprofen with minor relief. Pt reports hx of surgery on her neck 7-8 years ago to treat nerve compression and states that headaches subsided but returned a month ago. The current headache is similar to past headaches. She reports having blurred vision in left eye, nausea and photophobia. Pt reports that her mouth "twisted up" 2 days ago but symptoms subsided. Pt denies fever, chills, sore throat, trouble swallowing, vomiting, diarrhea, weakness, numbness, cough, SOB and any other pain.   Past Medical History  Diagnosis Date  . Morbid obesity   . Migraine headache   . Depression [facility] admission in 2001 & 2010  . Degenerative joint disease     Left TKA in 07/2009-Dr. Landau  . Prediabetes   . Chronic back pain   . Hypothyroidism 07/04/2011    Left lobectomy in 2004  . Syncope     with palpitations; evaluated by [facility] in 2008 with normal echo    Past Surgical History  Procedure Laterality Date  . Abdominal hysterectomy  1995     no oophorectomy  . Cholecystectomy, laparoscopic  2007    Dr. Lovell Sheehan  . Thyroidectomy, partial  2004    Left lobectomy - large benign nodule  . Knee arthroscopy  1995    Dr. Cleophas Dunker   . Total knee arthroplasty  07/2009    Left knee , Dr.  Dion Saucier   . Carpal tunnel release  1991    Bilateral  . Craniectomy suboccipital w/ cervical laminectomy / chiari  2007    Repair of Arnold-Chiari malformation  . Tonsillectomy    . Cesarean section      X2    Family History  Problem Relation Age of Onset  . Diabetes Mother   . Diabetes Father     emphsema   . Hyperlipidemia Father     History  Substance Use Topics  . Smoking status: Never Smoker   . Smokeless tobacco: Not on file  . Alcohol Use: No    OB History   Grav Para Term Preterm Abortions TAB SAB Ect Mult Living                  Review of Systems  HENT: Negative for trouble swallowing.   Eyes: Positive for photophobia and visual disturbance.  Gastrointestinal: Positive for nausea. Negative for vomiting.  Neurological: Positive for headaches. Negative for weakness and numbness.  All other systems reviewed and are negative.    Allergies  Latex  Home Medications   Current Outpatient Rx  Name  Route  Sig  Dispense  Refill  . aspirin EC 81 MG EC tablet   Oral   Take 1 tablet (81 mg total) by mouth daily.         Marland Kitchen  furosemide (LASIX) 20 MG tablet   Oral   Take 1 tablet (20 mg total) by mouth daily.   30 tablet   3   . ibuprofen (ADVIL,MOTRIN) 800 MG tablet   Oral   Take 1 tablet (800 mg total) by mouth 2 (two) times daily as needed for pain.   60 tablet   3   . levothyroxine (SYNTHROID, LEVOTHROID) 25 MCG tablet   Oral   Take 25 mcg by mouth daily.         . propranolol (INDERAL) 10 MG tablet   Oral   Take 1 tablet (10 mg total) by mouth 2 (two) times daily.   60 tablet   3   . SUMAtriptan (IMITREX) 100 MG tablet   Oral   Take 100 mg by mouth every 2 (two) hours as needed for migraine.         . metoCLOPramide (REGLAN) 10 MG tablet   Oral   Take 1 tablet (10 mg total) by mouth 3 (three) times daily as needed (headache / nausea).   20 tablet   0   . naproxen (NAPROSYN) 500 MG tablet   Oral   Take 1 tablet (500 mg total) by  mouth 2 (two) times daily with a meal.   30 tablet   0     BP 171/53  Pulse 66  Temp(Src) 98.3 F (36.8 C) (Oral)  Resp 20  Ht 5\' 2"  (1.575 m)  Wt 374 lb (169.645 kg)  BMI 68.39 kg/m2  SpO2 100%  Physical Exam  Nursing note and vitals reviewed. Constitutional: She is oriented to person, place, and time. She appears well-developed and well-nourished. No distress.  HENT:  Head: Normocephalic and atraumatic.  Tenderness over posterior occipital on left   Eyes: Conjunctivae are normal. Pupils are equal, round, and reactive to light. No scleral icterus.  Neck: Normal range of motion. Neck supple.  Cardiovascular: Normal rate, regular rhythm and normal heart sounds.   No murmur heard. Pulmonary/Chest: Effort normal and breath sounds normal. No respiratory distress. She has no wheezes. She has no rales.  Neurological: She is alert and oriented to person, place, and time. No cranial nerve deficit.  The patient has normal speech, normal memory, normal coordination of all 4 extremities without any limb ataxia. The patient has normal strength of all 4 extremities at the major muscle groups including shoulders, biceps, triceps, forearm, grips, quadriceps, hamstrings, calves. Normal sensation to light touch and pinprick of all 4 extremities and the trunk. No truncal ataxia, normal gait, cranial nerves III through XII are intact.  Normal peripheral visual fields. Normal extraocular movements. Normal pupillary exam. No pronator drift, normal reflexes at the brachial radialis, biceps and patellar tendons. Normal position sense, normal temperature sensation to cold and warm  Skin: Skin is warm and dry.  Psychiatric: She has a normal mood and affect. Her behavior is normal.    ED Course  Procedures (including critical care time) DIAGNOSTIC STUDIES: Oxygen Saturation is 100% on room air, normal by my interpretation.    COORDINATION OF CARE: 3:00 PM Discussed ED treatment with pt and pt agrees.   3:04 PM Ordered:  Medications  ketorolac (TORADOL) 30 MG/ML injection 30 mg (30 mg Intravenous Given 05/27/12 1552)  metoCLOPramide (REGLAN) injection 10 mg (10 mg Intravenous Given 05/27/12 1549)  sodium chloride 0.9 % bolus 1,000 mL (1,000 mLs Intravenous New Bag/Given 05/27/12 1549)  HYDROmorphone (DILAUDID) injection 1 mg (1 mg Intravenous Given 05/27/12 1638)  Labs Reviewed - No data to display No results found.   1. Headache       MDM  On reevaluation the patient has mild pain which is persistent, this has resolved with Dilaudid and the patient states now that she wants to go home. Her blood pressure has normalized, I have given her referral to a neurologist. Mordecai Maes take the patient has had an MRI in April of last year which did not show any significant findings her head, I do not detect any abnormal neurologic symptoms, she is stable for discharge with followup. She is improved with the medications as above.       I personally performed the services described in this documentation, which was scribed in my presence. The recorded information has been reviewed and is accurate.      Vickie Roller, MD 05/27/12 [phone]

## 2012-05-27 NOTE — ED Notes (Signed)
Patient and family given Ginger Ale per RN approval. Patient states she is feeling better at this time.

## 2012-05-27 NOTE — ED Notes (Signed)
Patient states she does not need anything at this time. 

## 2012-05-27 NOTE — Progress Notes (Signed)
  Subjective:    Patient ID: [name], female    DOB: Oct 12, 1960, 52 y.o.   MRN: [id number]  HPI   Pt presents with migraine for the past 2 weeks, has had previously but currently no medications, ibuprofen is not helping. + Photophobia, +nausea, no emesis Took her BP at home 189/100    Review of Systems   GEN- denies fatigue, fever, weight loss,weakness, recent illness HEENT- denies eye drainage, change in vision, nasal discharge, CVS- denies chest pain, palpitations RESP- denies SOB, cough, wheeze Neuro- + headache, dizziness, syncope, seizure activity      Objective:   Physical Exam   GEN- NAD, alert and oriented x3, obese HEENT- PERRL, EOMI, non injected sclera, pink conjunctiva, MMM, oropharynx clear, fundus clear, no sinus tenderness, TM clear bilat, no nystagumus Neck- Supple, good ROM, mild spasm in trapezius CVS- RRR, no murmur RESP-CTAB EXT- No edema Pulses- Radial, DP Neuro- CNII-XII in tact, no focal deficits       Assessment & Plan:

## 2012-05-27 NOTE — ED Notes (Signed)
Pt c/o headache x1 week. Pt reports hx of migraine headaches. Pt states she was seen by MD yesterday and "given 2 shots" but denies any relief. Pt also c/o nausea but denies vomiting.

## 2012-06-03 ENCOUNTER — Other Ambulatory Visit: Payer: Self-pay | Admitting: Neurosurgery

## 2012-06-03 DIAGNOSIS — M5412 Radiculopathy, cervical region: Secondary | ICD-10-CM

## 2012-06-03 DIAGNOSIS — M542 Cervicalgia: Secondary | ICD-10-CM

## 2012-06-03 DIAGNOSIS — M47812 Spondylosis without myelopathy or radiculopathy, cervical region: Secondary | ICD-10-CM

## 2012-06-03 DIAGNOSIS — M5030 Other cervical disc degeneration, unspecified cervical region: Secondary | ICD-10-CM

## 2012-06-07 ENCOUNTER — Ambulatory Visit
Admission: RE | Admit: 2012-06-07 | Discharge: 2012-06-07 | Disposition: A | Discharge status: Home / Self Care | Payer: Medicare Other | Source: Ambulatory Visit | Attending: Neurosurgery | Admitting: Neurosurgery

## 2012-06-07 DIAGNOSIS — M5030 Other cervical disc degeneration, unspecified cervical region: Secondary | ICD-10-CM

## 2012-06-07 DIAGNOSIS — M5412 Radiculopathy, cervical region: Secondary | ICD-10-CM

## 2012-06-07 DIAGNOSIS — M542 Cervicalgia: Secondary | ICD-10-CM

## 2012-06-07 DIAGNOSIS — M47812 Spondylosis without myelopathy or radiculopathy, cervical region: Secondary | ICD-10-CM

## 2012-06-08 ENCOUNTER — Other Ambulatory Visit: Payer: Medicare Other

## 2012-06-11 ENCOUNTER — Ambulatory Visit: Payer: Medicaid Other | Admitting: Family Medicine

## 2012-06-29 ENCOUNTER — Encounter: Payer: Self-pay | Admitting: Family Medicine

## 2012-06-29 ENCOUNTER — Ambulatory Visit (INDEPENDENT_AMBULATORY_CARE_PROVIDER_SITE_OTHER): Payer: Medicare Other | Admitting: Family Medicine

## 2012-06-29 VITALS — BP 142/94 | HR 95 | Resp 18 | Ht 62.0 in | Wt 387.1 lb

## 2012-06-29 DIAGNOSIS — R7309 Other abnormal glucose: Secondary | ICD-10-CM

## 2012-06-29 DIAGNOSIS — I10 Essential (primary) hypertension: Secondary | ICD-10-CM

## 2012-06-29 DIAGNOSIS — F32A Depression, unspecified: Secondary | ICD-10-CM

## 2012-06-29 DIAGNOSIS — F329 Major depressive disorder, single episode, unspecified: Secondary | ICD-10-CM

## 2012-06-29 DIAGNOSIS — R7303 Prediabetes: Secondary | ICD-10-CM

## 2012-06-29 MED ORDER — FLUOXETINE HCL 20 MG PO CAPS: ORAL_CAPSULE | ORAL | Status: DC

## 2012-06-29 MED ORDER — SERTRALINE HCL 50 MG PO TABS: 50.0000 mg | ORAL_TABLET | Freq: Every day | ORAL | Status: DC

## 2012-06-29 MED ORDER — TRAZODONE HCL 50 MG PO TABS: 50.0000 mg | ORAL_TABLET | Freq: Every day | ORAL | Status: DC

## 2012-06-29 NOTE — Assessment & Plan Note (Signed)
rept lab today. Pt encouraged to take metformin, dose will be determined based on lab. High risk of DM,weight as increased Patient educated about the importance of limiting  Carbohydrate intake , the need to commit to daily physical activity for a minimum of 30 minutes , and to commit weight loss. The fact that changes in all these areas will reduce or eliminate all together the development of diabetes is stressed.

## 2012-06-29 NOTE — Assessment & Plan Note (Signed)
Deteriorated. Patient re-educated about  the importance of commitment to a  minimum of 150 minutes of exercise per week. The importance of healthy food choices with portion control discussed. Encouraged to start a food diary, count calories and to consider  joining a support group. Sample diet sheets offered. Goals set by the patient for the next several months.    

## 2012-06-29 NOTE — Patient Instructions (Addendum)
F/u in 8 to 10 weeks  You are referred to therapist, pls try to schedule appt on your way out.  Mammogram is past due you will get paper with contact info to schedule.  New medications fluoxetine and trazodone to be started today for depression and poor sleep.  You need to make changes in how you are organizing your life since you are under so much stress currently  tSH and hem 7, and hBA1C today.  Thyroid med will be refilled and metformin for blood sugar an d to help with weight loss will be prescribed after results are available , as we discussed.  You will get info on prediabetes, you are prediabetic

## 2012-06-29 NOTE — Assessment & Plan Note (Signed)
Deteriorate, not suicidal or homicidal. Pt to resume medication, also to resume therapy

## 2012-06-29 NOTE — Progress Notes (Signed)
  Subjective:    Patient ID: [name], female    DOB: 1960/05/05, 52 y.o.   MRN: [id number]  HPI The PT is here for follow up and re-evaluation of chronic medical conditions, medication management and review of any available recent lab and radiology data.  Preventive health is updated, specifically  Cancer screening and Immunization.   The PT denies any adverse reactions to current medications since the last visit.  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 The stress is taking care of her aged parents, no time for herself , though sister helps this is inadequate     Review of Systems See HPI Denies recent fever or chills. Denies sinus pressure, nasal congestion, ear pain or sore throat. Denies chest congestion, productive cough or wheezing. Denies chest pains, palpitations and leg swelling Denies abdominal pain, nausea, vomiting,diarrhea or constipation.   Denies dysuria, frequency, hesitancy or incontinence. Denies joint pain, swelling and limitation in mobility. Denies , seizures, numbness, or tingling. Denies skin break down or rash.        Objective:   Physical Exam  Patient alert and oriented and in no cardiopulmonary distress.morbidly obese  HEENT: No facial asymmetry, EOMI, no sinus tenderness,  oropharynx pink and moist.  Neck supple no adenopathy.  Chest: Clear to auscultation bilaterally.  CVS: S1, S2 no murmurs, no S3.  ABD: Soft non tender. Bowel sounds normal.  Ext: No edema  MS: Adequate ROM spine, shoulders, hips and knees.  Skin: Intact, no ulcerations or rash noted.  Psych: Good eye contact, normal affect. Memory intact not anxious  depressed appearing.  CNS: CN 2-12 intact, power, tone and sensation  normal throughout.       Assessment & Plan:

## 2012-06-29 NOTE — Assessment & Plan Note (Signed)
Elevated blood pressure at this visit. DASH diet and commitment to daily physical activity for a minimum of 30 minutes discussed and encouraged, as a part of hypertension management. The importance of attaining a healthy weight is also discussed.

## 2012-06-30 LAB — TSH: TSH: 3.876 u[IU]/mL (ref 0.350–4.500)

## 2012-06-30 LAB — BASIC METABOLIC PANEL
BUN: 17 mg/dL (ref 6–23)
Glucose, Bld: 88 mg/dL (ref 70–99)
Potassium: 4 mEq/L (ref 3.5–5.3)

## 2012-07-01 ENCOUNTER — Other Ambulatory Visit: Payer: Self-pay

## 2012-07-01 MED ORDER — METFORMIN HCL 500 MG PO TABS: 500.0000 mg | ORAL_TABLET | Freq: Every day | ORAL | Status: DC

## 2012-07-15 ENCOUNTER — Encounter: Payer: Self-pay | Admitting: Family Medicine

## 2012-07-15 ENCOUNTER — Ambulatory Visit (INDEPENDENT_AMBULATORY_CARE_PROVIDER_SITE_OTHER): Payer: Medicare Other | Admitting: Family Medicine

## 2012-07-15 VITALS — BP 160/100 | HR 60 | Resp 20 | Ht 62.0 in | Wt 379.1 lb

## 2012-07-15 DIAGNOSIS — R7303 Prediabetes: Secondary | ICD-10-CM

## 2012-07-15 DIAGNOSIS — I10 Essential (primary) hypertension: Secondary | ICD-10-CM

## 2012-07-15 DIAGNOSIS — K219 Gastro-esophageal reflux disease without esophagitis: Secondary | ICD-10-CM | POA: Insufficient documentation

## 2012-07-15 DIAGNOSIS — E039 Hypothyroidism, unspecified: Secondary | ICD-10-CM

## 2012-07-15 DIAGNOSIS — F329 Major depressive disorder, single episode, unspecified: Secondary | ICD-10-CM

## 2012-07-15 DIAGNOSIS — F32A Depression, unspecified: Secondary | ICD-10-CM

## 2012-07-15 DIAGNOSIS — R7309 Other abnormal glucose: Secondary | ICD-10-CM

## 2012-07-15 DIAGNOSIS — Z1211 Encounter for screening for malignant neoplasm of colon: Secondary | ICD-10-CM

## 2012-07-15 MED ORDER — SUCRALFATE 1 GM/10ML PO SUSP: 1.0000 g | Freq: Four times a day (QID) | ORAL | Status: DC

## 2012-07-15 MED ORDER — DEXLANSOPRAZOLE 30 MG PO CPDR: 30.0000 mg | DELAYED_RELEASE_CAPSULE | Freq: Every day | ORAL | Status: DC

## 2012-07-15 MED ORDER — TRIAMTERENE-HCTZ 37.5-25 MG PO TABS: 1.0000 | ORAL_TABLET | Freq: Every day | ORAL | Status: DC

## 2012-07-15 NOTE — Progress Notes (Signed)
  Subjective:    Patient ID: [name], female    DOB: 1960-11-14, 52 y.o.   MRN: [id number]  HPI 2 week h/o heartburn, nausea and vomit, emesis with everything she eats, has also noted stool is black. Pain in stomach when she eats   Review of Systems See HPI Denies recent fever or chills. Denies sinus pressure, nasal congestion, ear pain or sore throat. Denies chest congestion, productive cough or wheezing. Denies chest pains, palpitations and leg swelling  Denies dysuria, frequency, hesitancy or incontinence. Chronic  joint pain and limitation in mobility. Denies headaches, seizures, numbness, or tingling. Denies uncontrolled depression, anxiety or insomnia. Denies skin break down or rash.        Objective:   Physical Exam Patient alert and oriented and in no cardiopulmonary distress.  HEENT: No facial asymmetry, EOMI, no sinus tenderness,  oropharynx pink and moist.  Neck supple no adenopathy.  Chest: Clear to auscultation bilaterally.  CVS: S1, S2 no murmurs, no S3.  ABD: Soft non tender. Bowel sounds normal. Rectal: no mass, no visible blood , heme negative stool Ext: No edema  MS: Adequate ROM spine, shoulders, hips and knees.  Skin: Intact, no ulcerations or rash noted.  Psych: Good eye contact, normal affect. Memory intact not anxious or depressed appearing.  CNS: CN 2-12 intact, power, tone and sensation normal throughout.        Assessment & Plan:

## 2012-07-15 NOTE — Patient Instructions (Addendum)
F/u in 3 .5 weeks Blood pressure is very high, stop furosemide, new is maxzide 25 mg one daily  Stop ibuprofen and aspirin, you will be referred for GI to evaluate you, also you need blood test and 2 new meds are sent in for heartburn  CBc, Hpylori today

## 2012-07-16 LAB — CBC
HCT: 37.9 % (ref 36.0–46.0)
Hemoglobin: 11.9 g/dL — ABNORMAL LOW (ref 12.0–15.0)
MCH: 23.1 pg — ABNORMAL LOW (ref 26.0–34.0)
MCHC: 31.4 g/dL (ref 30.0–36.0)
MCV: 73.4 fL — ABNORMAL LOW (ref 78.0–100.0)
RDW: 15.6 % — ABNORMAL HIGH (ref 11.5–15.5)

## 2012-07-19 NOTE — Assessment & Plan Note (Signed)
Controlled, no change in medication  

## 2012-07-19 NOTE — Assessment & Plan Note (Signed)
Improved, continue once daily metformin

## 2012-07-19 NOTE — Assessment & Plan Note (Addendum)
Improved. Pt applauded on succesful weight loss through lifestyle change, and encouraged to continue same. Weight loss goal set for the next several months.  

## 2012-07-19 NOTE — Assessment & Plan Note (Signed)
Uncontrolled and markedly elevated blood pressure, pt to resume medication DASH diet and commitment to daily physical activity for a minimum of 30 minutes discussed and encouraged, as a part of hypertension management. The importance of attaining a healthy weight is also discussed.

## 2012-07-19 NOTE — Assessment & Plan Note (Signed)
Needs to re establish with psych, not suicidal or homicidal

## 2012-07-19 NOTE — Assessment & Plan Note (Signed)
Uncontrolled, symptomatic, start meds, check h pylori and GI eval

## 2012-07-29 ENCOUNTER — Ambulatory Visit: Payer: Medicare Other | Admitting: Gastroenterology

## 2012-08-04 ENCOUNTER — Other Ambulatory Visit: Payer: Self-pay | Admitting: Family Medicine

## 2012-08-04 DIAGNOSIS — Z139 Encounter for screening, unspecified: Secondary | ICD-10-CM

## 2012-08-06 ENCOUNTER — Ambulatory Visit: Payer: Medicare Other | Admitting: Gastroenterology

## 2012-08-13 ENCOUNTER — Ambulatory Visit (HOSPITAL_COMMUNITY)
Admission: RE | Admit: 2012-08-13 | Discharge: 2012-08-13 | Disposition: A | Discharge status: Home / Self Care | Payer: Medicare Other | Source: Ambulatory Visit | Attending: Family Medicine | Admitting: Family Medicine

## 2012-08-13 DIAGNOSIS — Z1231 Encounter for screening mammogram for malignant neoplasm of breast: Secondary | ICD-10-CM | POA: Insufficient documentation

## 2012-08-13 DIAGNOSIS — Z139 Encounter for screening, unspecified: Secondary | ICD-10-CM

## 2012-08-31 ENCOUNTER — Ambulatory Visit: Payer: Medicare Other | Admitting: Family Medicine

## 2012-10-20 ENCOUNTER — Other Ambulatory Visit: Payer: Self-pay | Admitting: Family Medicine

## 2012-12-07 ENCOUNTER — Encounter: Payer: Self-pay | Admitting: Family Medicine

## 2012-12-07 ENCOUNTER — Ambulatory Visit (INDEPENDENT_AMBULATORY_CARE_PROVIDER_SITE_OTHER): Payer: Medicare Other | Admitting: Family Medicine

## 2012-12-07 VITALS — BP 126/78 | HR 95 | Resp 18 | Ht 62.0 in | Wt 378.0 lb

## 2012-12-07 DIAGNOSIS — IMO0002 Reserved for concepts with insufficient information to code with codable children: Secondary | ICD-10-CM

## 2012-12-07 DIAGNOSIS — Z23 Encounter for immunization: Secondary | ICD-10-CM

## 2012-12-07 DIAGNOSIS — M1712 Unilateral primary osteoarthritis, left knee: Secondary | ICD-10-CM | POA: Insufficient documentation

## 2012-12-07 DIAGNOSIS — E039 Hypothyroidism, unspecified: Secondary | ICD-10-CM

## 2012-12-07 DIAGNOSIS — F329 Major depressive disorder, single episode, unspecified: Secondary | ICD-10-CM

## 2012-12-07 DIAGNOSIS — F32A Depression, unspecified: Secondary | ICD-10-CM

## 2012-12-07 DIAGNOSIS — D649 Anemia, unspecified: Secondary | ICD-10-CM

## 2012-12-07 DIAGNOSIS — R7303 Prediabetes: Secondary | ICD-10-CM

## 2012-12-07 DIAGNOSIS — M1710 Unilateral primary osteoarthritis, unspecified knee: Secondary | ICD-10-CM

## 2012-12-07 DIAGNOSIS — R7309 Other abnormal glucose: Secondary | ICD-10-CM

## 2012-12-07 DIAGNOSIS — Z1211 Encounter for screening for malignant neoplasm of colon: Secondary | ICD-10-CM

## 2012-12-07 DIAGNOSIS — I10 Essential (primary) hypertension: Secondary | ICD-10-CM

## 2012-12-07 DIAGNOSIS — E559 Vitamin D deficiency, unspecified: Secondary | ICD-10-CM

## 2012-12-07 LAB — LIPID PANEL
HDL: 41 mg/dL (ref >39)
LDL Cholesterol: 77 mg/dL (ref 0–99)
Total CHOL/HDL Ratio: 3.1 Ratio
Triglycerides: 51 mg/dL (ref <150)
VLDL: 10 mg/dL (ref 0–40)

## 2012-12-07 LAB — BASIC METABOLIC PANEL
BUN: 12 mg/dL (ref 6–23)
Calcium: 9 mg/dL (ref 8.4–10.5)
Chloride: 105 mEq/L (ref 96–112)
Creat: 0.67 mg/dL (ref 0.50–1.10)
Sodium: 139 mEq/L (ref 135–145)

## 2012-12-07 LAB — CBC
HCT: 36.9 % (ref 36.0–46.0)
Hemoglobin: 11.5 g/dL — ABNORMAL LOW (ref 12.0–15.0)
MCHC: 31.2 g/dL (ref 30.0–36.0)
MCV: 72.9 fL — ABNORMAL LOW (ref 78.0–100.0)
RBC: 5.06 MIL/uL (ref 3.87–5.11)
RDW: 15.5 % (ref 11.5–15.5)
WBC: 7 10*3/uL (ref 4.0–10.5)

## 2012-12-07 LAB — IRON: Iron: 37 ug/dL — ABNORMAL LOW (ref 42–145)

## 2012-12-07 MED ORDER — IBUPROFEN 800 MG PO TABS: ORAL_TABLET | ORAL | Status: DC

## 2012-12-07 NOTE — Assessment & Plan Note (Signed)
Controlled, no change in medication DASH diet and commitment to daily physical activity for a minimum of 30 minutes discussed and encouraged, as a part of hypertension management. The importance of attaining a healthy weight is also discussed.  

## 2012-12-07 NOTE — Progress Notes (Signed)
  Subjective:    Patient ID: [name], female    DOB: [birth year], 52 y.o.   MRN: [id number]  HPI The PT is here for follow up and re-evaluation of chronic medical conditions, medication management and review of any available recent lab and radiology data.  Preventive health is updated, specifically  Cancer screening and Immunization.   Questions or concerns regarding consultations or procedures which the PT has had in the interim are  Addressed.Has been seeing the therapist which has been helpful The PT denies any adverse reactions to current medications since the last visit.  C/o increasded left knee pain and states she "has to do something about her weight' Wants to resume phentermine   Review of Systems See HPI Denies recent fever or chills. Denies sinus pressure, nasal congestion, ear pain or sore throat. Denies chest congestion, productive cough or wheezing. Denies chest pains, palpitations and leg swelling Denies abdominal pain, nausea, vomiting,diarrhea or constipation.   Denies dysuria, frequency, hesitancy or incontinence.  Denies headaches, seizures, numbness, or tingling.  Denies skin break down or rash.        Objective:   Physical Exam  Patient alert and oriented and in no cardiopulmonary distress.  HEENT: No facial asymmetry, EOMI, no sinus tenderness,  oropharynx pink and moist.  Neck supple no adenopathy.  Chest: Clear to auscultation bilaterally.  CVS: S1, S2 no murmurs, no S3.  ABD: Soft non tender. Bowel sounds normal.  Ext: No edema  MS: Adequate ROM spine, shoulders, hips and decreased in left  Knee which is swollen and mildly tender.  Skin: Intact, no ulcerations or rash noted.  Psych: Good eye contact, normal affect. Memory intact not anxious or depressed appearing.  CNS: CN 2-12 intact, power, tone and sensation normal throughout.       Assessment & Plan:

## 2012-12-07 NOTE — Patient Instructions (Addendum)
F/u in 3 month, call if you need me before  Flu vaccine today  You are referred to dr Karilyn Cota for screening colonoscopy  Labs today CBc , iron and ferritin, lipid, chem 7, TSH , HBA1c and vit D, H pylori  I will send a message re labs to you, if thyroid normal, you will start once daily phentermine, I will send in when I send lab result, You also need to be on the alert for need for both iron and vitamin D supplements. As discussed, if your HBA1C is up to 6.5 I will try to prescribe janumet in place of metformin since you did not tolerate that  Ibuprofen 800mg  one table once daily, as needed, for uncontrolled knee pain  Weight loss goal of 4 to 6 pounds per month (up to 8 pounds is OK, but no more)  Eat between 7am and 7pm on a regular schedule, controlled amts with a lot of vegetables  Water only to drink, aim for 64 ounces per day  Small snacks of fresh fruit, vegetable and small portions of nuts are OK between meals

## 2012-12-08 DIAGNOSIS — E559 Vitamin D deficiency, unspecified: Secondary | ICD-10-CM | POA: Insufficient documentation

## 2012-12-08 LAB — H. PYLORI ANTIBODY, IGG: H Pylori IgG: 0.5 {ISR}

## 2012-12-08 LAB — VITAMIN D 25 HYDROXY (VIT D DEFICIENCY, FRACTURES): Vit D, 25-Hydroxy: 20 ng/mL — ABNORMAL LOW (ref 30–89)

## 2012-12-08 LAB — HEMOGLOBIN A1C: Mean Plasma Glucose: 134 mg/dL — ABNORMAL HIGH (ref <117)

## 2012-12-08 MED ORDER — METFORMIN HCL ER 500 MG PO TB24: 500.0000 mg | ORAL_TABLET | Freq: Every day | ORAL | Status: DC

## 2012-12-08 MED ORDER — ERGOCALCIFEROL 1.25 MG (50000 UT) PO CAPS: 50000.0000 [IU] | ORAL_CAPSULE | ORAL | Status: DC

## 2012-12-08 MED ORDER — PHENTERMINE HCL 37.5 MG PO TABS: 37.5000 mg | ORAL_TABLET | Freq: Every day | ORAL | Status: DC

## 2012-12-09 ENCOUNTER — Encounter (INDEPENDENT_AMBULATORY_CARE_PROVIDER_SITE_OTHER): Payer: Self-pay | Admitting: *Deleted

## 2012-12-14 NOTE — Assessment & Plan Note (Signed)
Increased knee pain with weight gain, will prescribe ibuprofen

## 2012-12-14 NOTE — Assessment & Plan Note (Signed)
Needs replacement for 6 months minimum

## 2012-12-14 NOTE — Assessment & Plan Note (Signed)
Controlled, no change in medication  

## 2012-12-14 NOTE — Assessment & Plan Note (Signed)
Deteriorated. Patient re-educated about  the importance of commitment to a  minimum of 150 minutes of exercise per week. The importance of healthy food choices with portion control discussed. Encouraged to start a food diary, count calories and to consider  joining a support group. Sample diet sheets offered. Goals set by the patient for the next several months. Resume phentermine    

## 2012-12-18 ENCOUNTER — Emergency Department (HOSPITAL_COMMUNITY): Payer: Medicare Other

## 2012-12-18 ENCOUNTER — Emergency Department (HOSPITAL_COMMUNITY)
Admission: EM | Admit: 2012-12-18 | Discharge: 2012-12-18 | Disposition: A | Discharge status: Home / Self Care | Payer: Medicare Other | Attending: Emergency Medicine | Admitting: Emergency Medicine

## 2012-12-18 ENCOUNTER — Encounter (HOSPITAL_COMMUNITY): Payer: Self-pay | Admitting: *Deleted

## 2012-12-18 DIAGNOSIS — Z79899 Other long term (current) drug therapy: Secondary | ICD-10-CM | POA: Insufficient documentation

## 2012-12-18 DIAGNOSIS — R059 Cough, unspecified: Secondary | ICD-10-CM | POA: Insufficient documentation

## 2012-12-18 DIAGNOSIS — R05 Cough: Secondary | ICD-10-CM | POA: Insufficient documentation

## 2012-12-18 DIAGNOSIS — G8929 Other chronic pain: Secondary | ICD-10-CM | POA: Insufficient documentation

## 2012-12-18 DIAGNOSIS — F3289 Other specified depressive episodes: Secondary | ICD-10-CM | POA: Insufficient documentation

## 2012-12-18 DIAGNOSIS — F411 Generalized anxiety disorder: Secondary | ICD-10-CM | POA: Insufficient documentation

## 2012-12-18 DIAGNOSIS — Z91040 Latex allergy status: Secondary | ICD-10-CM | POA: Insufficient documentation

## 2012-12-18 DIAGNOSIS — R142 Eructation: Secondary | ICD-10-CM | POA: Insufficient documentation

## 2012-12-18 DIAGNOSIS — Z8739 Personal history of other diseases of the musculoskeletal system and connective tissue: Secondary | ICD-10-CM | POA: Insufficient documentation

## 2012-12-18 DIAGNOSIS — R072 Precordial pain: Secondary | ICD-10-CM | POA: Insufficient documentation

## 2012-12-18 DIAGNOSIS — R141 Gas pain: Secondary | ICD-10-CM | POA: Insufficient documentation

## 2012-12-18 DIAGNOSIS — R0600 Dyspnea, unspecified: Secondary | ICD-10-CM

## 2012-12-18 DIAGNOSIS — M7989 Other specified soft tissue disorders: Secondary | ICD-10-CM | POA: Insufficient documentation

## 2012-12-18 DIAGNOSIS — Z880 Allergy status to penicillin: Secondary | ICD-10-CM | POA: Insufficient documentation

## 2012-12-18 DIAGNOSIS — F329 Major depressive disorder, single episode, unspecified: Secondary | ICD-10-CM | POA: Insufficient documentation

## 2012-12-18 DIAGNOSIS — R0989 Other specified symptoms and signs involving the circulatory and respiratory systems: Secondary | ICD-10-CM | POA: Insufficient documentation

## 2012-12-18 DIAGNOSIS — R0609 Other forms of dyspnea: Secondary | ICD-10-CM | POA: Insufficient documentation

## 2012-12-18 DIAGNOSIS — E039 Hypothyroidism, unspecified: Secondary | ICD-10-CM | POA: Insufficient documentation

## 2012-12-18 DIAGNOSIS — Z8679 Personal history of other diseases of the circulatory system: Secondary | ICD-10-CM | POA: Insufficient documentation

## 2012-12-18 DIAGNOSIS — R0682 Tachypnea, not elsewhere classified: Secondary | ICD-10-CM | POA: Insufficient documentation

## 2012-12-18 LAB — CBC WITH DIFFERENTIAL/PLATELET
Basophils Absolute: 0 10*3/uL (ref 0.0–0.1)
Basophils Relative: 0 % (ref 0–1)
Eosinophils Absolute: 0.2 10*3/uL (ref 0.0–0.7)
Eosinophils Relative: 3 % (ref 0–5)
HCT: 36.8 % (ref 36.0–46.0)
Hemoglobin: 11.1 g/dL — ABNORMAL LOW (ref 12.0–15.0)
Lymphocytes Relative: 41 % (ref 12–46)
Lymphs Abs: 2.8 10*3/uL (ref 0.7–4.0)
MCH: 23.5 pg — ABNORMAL LOW (ref 26.0–34.0)
MCHC: 30.2 g/dL (ref 30.0–36.0)
MCV: 77.8 fL — ABNORMAL LOW (ref 78.0–100.0)
Monocytes Absolute: 0.3 10*3/uL (ref 0.1–1.0)
Monocytes Relative: 5 % (ref 3–12)
Neutro Abs: 3.6 10*3/uL (ref 1.7–7.7)
Neutrophils Relative %: 52 % (ref 43–77)
Platelets: 372 10*3/uL (ref 150–400)
RBC: 4.73 MIL/uL (ref 3.87–5.11)
RDW: 16 % — ABNORMAL HIGH (ref 11.5–15.5)
WBC: 7 10*3/uL (ref 4.0–10.5)

## 2012-12-18 LAB — PRO B NATRIURETIC PEPTIDE: Pro B Natriuretic peptide (BNP): 370.1 pg/mL — ABNORMAL HIGH (ref 0–125)

## 2012-12-18 LAB — BASIC METABOLIC PANEL
BUN: 11 mg/dL (ref 6–23)
CO2: 27 mEq/L (ref 19–32)
Calcium: 9.5 mg/dL (ref 8.4–10.5)
Chloride: 101 mEq/L (ref 96–112)
Creatinine, Ser: 0.66 mg/dL (ref 0.50–1.10)
GFR calc Af Amer: >90 mL/min (ref >90)
GFR calc non Af Amer: >90 mL/min (ref >90)
Glucose, Bld: 93 mg/dL (ref 70–99)
Potassium: 3.8 mEq/L (ref 3.5–5.1)
Sodium: 138 mEq/L (ref 135–145)

## 2012-12-18 LAB — D-DIMER, QUANTITATIVE (NOT AT ARMC): D-Dimer, Quant: 0.78 ug/mL-FEU — ABNORMAL HIGH (ref 0.00–0.48)

## 2012-12-18 LAB — TROPONIN I: Troponin I: <0.3 ng/mL (ref <0.30)

## 2012-12-18 MED ORDER — IPRATROPIUM BROMIDE 0.02 % IN SOLN
0.5000 mg | Freq: Once | RESPIRATORY_TRACT | Status: AC
  Administered 2012-12-18: 0.5 mg via RESPIRATORY_TRACT
  Filled 2012-12-18: qty 2.5

## 2012-12-18 MED ORDER — ASPIRIN 81 MG PO CHEW
324.0000 mg | CHEWABLE_TABLET | Freq: Once | ORAL | Status: AC
  Administered 2012-12-18: 324 mg via ORAL
  Filled 2012-12-18: qty 4

## 2012-12-18 MED ORDER — POTASSIUM CHLORIDE CRYS ER 20 MEQ PO TBCR
20.0000 meq | EXTENDED_RELEASE_TABLET | Freq: Once | ORAL | Status: AC
  Administered 2012-12-18: 20 meq via ORAL
  Filled 2012-12-18: qty 1

## 2012-12-18 MED ORDER — ALBUTEROL SULFATE (5 MG/ML) 0.5% IN NEBU
5.0000 mg | INHALATION_SOLUTION | Freq: Once | RESPIRATORY_TRACT | Status: AC
  Administered 2012-12-18: 5 mg via RESPIRATORY_TRACT
  Filled 2012-12-18: qty 1

## 2012-12-18 MED ORDER — IOHEXOL 350 MG/ML SOLN
100.0000 mL | Freq: Once | INTRAVENOUS | Status: AC | PRN
  Administered 2012-12-18: 100 mL via INTRAVENOUS

## 2012-12-18 MED ORDER — FUROSEMIDE 20 MG PO TABS: 20.0000 mg | ORAL_TABLET | Freq: Every day | ORAL | Status: DC

## 2012-12-18 MED ORDER — LORAZEPAM 2 MG/ML IJ SOLN
1.0000 mg | Freq: Once | INTRAMUSCULAR | Status: AC
  Administered 2012-12-18: 1 mg via INTRAVENOUS
  Filled 2012-12-18: qty 1

## 2012-12-18 MED ORDER — FUROSEMIDE 10 MG/ML IJ SOLN
20.0000 mg | Freq: Once | INTRAMUSCULAR | Status: AC
  Administered 2012-12-18: 20 mg via INTRAVENOUS
  Filled 2012-12-18: qty 2

## 2012-12-18 NOTE — ED Notes (Signed)
Pt alert & oriented x4, stable gait. Patient given discharge instructions, paperwork & prescription(s). Patient  instructed to stop at the registration desk to finish any additional paperwork. Patient verbalized understanding. Pt left department w/ no further questions. 

## 2012-12-18 NOTE — ED Provider Notes (Signed)
CSN: [id number]     Arrival date & time 12/18/12  1302 History  This chart was scribed for Raeford Razor, MD by Bennett Scrape, ED Scribe. This patient was seen in room APA14/APA14 and the patient's care was started at 1:16 PM.   Chief Complaint  Patient presents with  . Shortness of Breath    The history is provided by the patient. No language interpreter was used.    HPI Comments: [name] is a 52 y.o. female who presents to the Emergency Department complaining of severe, persistent SOB that started last night waking her from sleep. She reports that the symptoms have gradually worsened over the past 3 hours. Some chest pressure. Substernal.  Does not radiate. No palpitations or diaphoresis.  She states that she took an extra fluid pill last night with no improvement. She reports one prior episode this severe "a long time ago" but is unable to give further details. She reports 2 episodes of dark stoo last week but denies any since then. She denies any other symptoms currently. She denies having a h/o cardiac or pulmonary conditions. She states that she used to take ASA daily but was told to stop by her PCP  [name] is PCP.  Past Medical History  Diagnosis Date  . Morbid obesity   . Migraine headache   . Depression [facility] admission in 2001 & 2010  . Degenerative joint disease     Left TKA in 07/2009-Dr. Landau  . Prediabetes   . Chronic back pain   . Hypothyroidism 07/04/2011    Left lobectomy in 2004  . Syncope     with palpitations; evaluated by [facility] in 2008 with normal echo   Past Surgical History  Procedure Laterality Date  . Abdominal hysterectomy  1995     no oophorectomy  . Cholecystectomy, laparoscopic  2007    Dr. Lovell Sheehan  . Thyroidectomy, partial  2004    Left lobectomy - large benign nodule  . Knee arthroscopy  1995    Dr. Cleophas Dunker   . Total knee arthroplasty  07/2009    Left knee , Dr. Dion Saucier   . Carpal  tunnel release  1991    Bilateral  . Craniectomy suboccipital w/ cervical laminectomy / chiari  2007    Repair of Arnold-Chiari malformation  . Tonsillectomy    . Cesarean section      X2  . Cholecystectomy    . Joint replacement     Family History  Problem Relation Age of Onset  . Diabetes Mother   . Diabetes Father     emphsema   . Hyperlipidemia Father    History  Substance Use Topics  . Smoking status: Never Smoker   . Smokeless tobacco: Not on file  . Alcohol Use: No   No OB history provided.  Review of Systems  Constitutional: Negative for fever.  Respiratory: Positive for cough and shortness of breath.   Cardiovascular: Positive for chest pain and leg swelling (chronic, no changes ).  Gastrointestinal: Positive for abdominal distention. Negative for blood in stool.  All other systems reviewed and are negative.    Allergies  Penicillins and Latex  Home Medications   Current Outpatient Rx  Name  Route  Sig  Dispense  Refill  . ergocalciferol (VITAMIN D2) 50000 UNITS capsule   Oral   Take 1 capsule (50,000 Units total) by mouth once a week. One capsule once weekly   12 capsule  1   . FLUoxetine (PROZAC) 20 MG capsule      Discontinue zoloft order, please dispense fluoxetine , effective 06/29/2012   30 capsule   2   . ibuprofen (ADVIL,MOTRIN) 800 MG tablet      One tablet once daily, as needed, for uncontrolled knee pain   30 tablet   1   . levothyroxine (SYNTHROID, LEVOTHROID) 25 MCG tablet      TAKE 1 TABLET (25 MCG TOTAL) BY MOUTH DAILY BEFORE BREAKFAST. FOR HYPOTHYROIDISM   30 tablet   0   . metFORMIN (GLUCOPHAGE XR) 500 MG 24 hr tablet   Oral   Take 1 tablet (500 mg total) by mouth daily with breakfast.   30 tablet   11   . EXPIRED: phentermine (ADIPEX-P) 37.5 MG tablet   Oral   Take 1 tablet (37.5 mg total) by mouth daily before breakfast.   30 tablet   3   . phentermine (ADIPEX-P) 37.5 MG tablet   Oral   Take 1 tablet (37.5 mg  total) by mouth daily before breakfast.   30 tablet   1   . traZODone (DESYREL) 50 MG tablet   Oral   Take 1 tablet (50 mg total) by mouth at bedtime.   30 tablet   5   . triamterene-hydrochlorothiazide (MAXZIDE-25) 37.5-25 MG per tablet   Oral   Take 1 tablet by mouth daily.   30 tablet   11    Triage Vitals: BP 150/84  Pulse 61  Temp(Src) 98.1 F (36.7 C) (Oral)  Ht 5\' 2"  (1.575 m)  Wt 374 lb (169.645 kg)  BMI 68.39 kg/m2  SpO2 100%  Physical Exam  Nursing note and vitals reviewed. Constitutional: She is oriented to person, place, and time. She appears well-developed and well-nourished.  Appears anxious and uncomfortable   HENT:  Head: Normocephalic and atraumatic.  Eyes: Conjunctivae and EOM are normal.  Neck: Normal range of motion. Neck supple. No tracheal deviation present.  Cardiovascular: Normal rate, regular rhythm and normal heart sounds.   No murmur heard. Pulmonary/Chest: Tachypnea noted. No respiratory distress. She has decreased breath sounds. She has no wheezes. She has no rales.  Mild tachypnea, decreased breath sounds bilaterally  Abdominal: Soft. Bowel sounds are normal. There is no tenderness.  Musculoskeletal: Normal range of motion. She exhibits edema (syymmetric bilateral lower extremity edema). She exhibits no tenderness (no calf tenderness).  Neurological: She is alert and oriented to person, place, and time. No cranial nerve deficit.  Skin: Skin is warm and dry.  Psychiatric: Her behavior is normal. Her mood appears anxious.    ED Course  Procedures (including critical care time)  DIAGNOSTIC STUDIES: Oxygen Saturation is 100% on room air, normal by my interpretation.    COORDINATION OF CARE: 1:20 PM-Discussed treatment plan which includes breathing treatment and CXR with pt at bedside and pt agreed to plan.   2:35 PM-Pt rechecked and feels improved. Informed pt of lab and radiology results thus far looking like low suspicion for dangerous  conditions. Discussed CT scan with the pt and pt agreed.   Labs Review Labs Reviewed  CBC WITH DIFFERENTIAL - Abnormal; Notable for the following:    Hemoglobin 11.1 (*)    MCV 77.8 (*)    MCH 23.5 (*)    RDW 16.0 (*)    All other components within normal limits  PRO B NATRIURETIC PEPTIDE - Abnormal; Notable for the following:    Pro B Natriuretic peptide (BNP) 370.1 (*)  All other components within normal limits  D-DIMER, QUANTITATIVE - Abnormal; Notable for the following:    D-Dimer, Quant 0.78 (*)    All other components within normal limits  BASIC METABOLIC PANEL  TROPONIN I   EKG:  Rhythm: normal sinus Vent. rate 64 BPM PR interval 192 ms QRS duration 86 ms QT/QTc 412/425 ms ST segments: NS ST changes   Imaging Review Dg Chest Portable 1 View  12/18/2012   CLINICAL DATA:  Shortness of breath and hypertension  EXAM: PORTABLE CHEST - 1 VIEW  COMPARISON:  February 22, 2011  FINDINGS: The lungs are clear. Heart is upper normal in size with normal pulmonary vascularity. No adenopathy. There is postoperative change in the lower cervical spine.  IMPRESSION: No edema or consolidation.   Electronically Signed   By: Bretta Bang M.D.   On: 12/18/2012 13:55    MDM   1. Dyspnea     1:36 PM 52yF with SOB. Onset last night. Seems to be wheezing when talking, but none heard on lung auscultation. No rash, swelling, itching, stridor, hypotension, or abdominal symptoms to suggest allergic reaction/anaphylais. Lung and heart exams are very limited 2/2 body habitus. Seems fairly uncomfortable, but HR in 60-70s and not on rate controlling meds.  Oxygen saturations high 90s to 100% on RA. Aside from obesity, no identified risk factors for PE. Low risk based on Well's Criteria. Will check d-dimer. Atypical for ACS given constant duration since last night. Initial EKG poor quality, but no overlying concerning findings. Needs repeat. No cough, fever or other symptoms to suggest infectious  etiology. Weight in down 4 lbs since 9/22. Doesn't appear to have diagnosed underlying cardiac or pulmonary disease. Reports two dark stools last week, but has had normal appearing BM since. Conjunctiva not pale and no compensatory increased heart rate as would expect is symptoms from acute blood loss. Her obesity is likely contributing to her symptoms, although does not explain acute change. Will try nebs for possible wheezing/bronchospasm. May be some anxiety component. Nasal cannula placed for possible symptom relief/placebo. Aspirin. CXR, labs and continued monitoring at this time.   CXR w/o acute abnormality. Mild anemia, which appears to be her baseline. Only mild elevation of BNP. D-dimer is elevated though. Will CT.   CT angiography without acute abnormality. She reports improved symptoms, but not resolution.. She is concerned that she is having a lot of swelling. Clinically this doesn't appear to be the case. She perhaps has some mild swelling of her lower extremities, but not particularly impressive. We'll give a few days with a low-dose of Lasix. I feel she is appropriate for discharge at this time. Emergent return precautions were discussed. Outpatient followup otherwise.  Raeford Razor, MD 12/18/12 1736

## 2012-12-18 NOTE — ED Notes (Signed)
Sob since last night.  No pain.

## 2012-12-25 ENCOUNTER — Telehealth (INDEPENDENT_AMBULATORY_CARE_PROVIDER_SITE_OTHER): Payer: Self-pay | Admitting: *Deleted

## 2012-12-25 ENCOUNTER — Other Ambulatory Visit (INDEPENDENT_AMBULATORY_CARE_PROVIDER_SITE_OTHER): Payer: Self-pay | Admitting: *Deleted

## 2012-12-25 ENCOUNTER — Encounter (INDEPENDENT_AMBULATORY_CARE_PROVIDER_SITE_OTHER): Payer: Self-pay | Admitting: *Deleted

## 2012-12-25 DIAGNOSIS — Z1211 Encounter for screening for malignant neoplasm of colon: Secondary | ICD-10-CM

## 2012-12-25 MED ORDER — PEG-KCL-NACL-NASULF-NA ASC-C 100 G PO SOLR: 1.0000 | Freq: Once | ORAL | Status: DC

## 2012-12-25 NOTE — Telephone Encounter (Signed)
Patient needs movi prep 

## 2012-12-29 ENCOUNTER — Encounter: Payer: Self-pay | Admitting: Family Medicine

## 2012-12-29 ENCOUNTER — Ambulatory Visit (INDEPENDENT_AMBULATORY_CARE_PROVIDER_SITE_OTHER): Payer: Medicare Other | Admitting: Family Medicine

## 2012-12-29 VITALS — BP 124/78 | HR 97 | Resp 16 | Wt 379.0 lb

## 2012-12-29 DIAGNOSIS — R7309 Other abnormal glucose: Secondary | ICD-10-CM

## 2012-12-29 DIAGNOSIS — R0600 Dyspnea, unspecified: Secondary | ICD-10-CM | POA: Insufficient documentation

## 2012-12-29 DIAGNOSIS — R0683 Snoring: Secondary | ICD-10-CM

## 2012-12-29 DIAGNOSIS — R0609 Other forms of dyspnea: Secondary | ICD-10-CM

## 2012-12-29 DIAGNOSIS — M546 Pain in thoracic spine: Secondary | ICD-10-CM | POA: Insufficient documentation

## 2012-12-29 DIAGNOSIS — R7303 Prediabetes: Secondary | ICD-10-CM

## 2012-12-29 DIAGNOSIS — I10 Essential (primary) hypertension: Secondary | ICD-10-CM

## 2012-12-29 DIAGNOSIS — E039 Hypothyroidism, unspecified: Secondary | ICD-10-CM

## 2012-12-29 MED ORDER — METHYLPREDNISOLONE ACETATE 80 MG/ML IJ SUSP
80.0000 mg | Freq: Once | INTRAMUSCULAR | Status: AC
  Administered 2012-12-29: 80 mg via INTRAMUSCULAR

## 2012-12-29 MED ORDER — HYDROCODONE-ACETAMINOPHEN 5-325 MG PO TABS: ORAL_TABLET | ORAL | Status: DC

## 2012-12-29 MED ORDER — FUROSEMIDE 20 MG PO TABS: 20.0000 mg | ORAL_TABLET | Freq: Two times a day (BID) | ORAL | Status: DC

## 2012-12-29 MED ORDER — CYCLOBENZAPRINE HCL 10 MG PO TABS: 10.0000 mg | ORAL_TABLET | Freq: Three times a day (TID) | ORAL | Status: DC | PRN

## 2012-12-29 MED ORDER — KETOROLAC TROMETHAMINE 60 MG/2ML IJ SOLN
60.0000 mg | Freq: Once | INTRAMUSCULAR | Status: AC
  Administered 2012-12-29: 60 mg via INTRAMUSCULAR

## 2012-12-29 MED ORDER — POTASSIUM CHLORIDE CRYS ER 20 MEQ PO TBCR: 20.0000 meq | EXTENDED_RELEASE_TABLET | Freq: Every day | ORAL | Status: DC

## 2012-12-29 NOTE — Progress Notes (Signed)
  Subjective:    Patient ID: [name], female    DOB: Oct 23, 1960, 52 y.o.   MRN: [id number]  HPI 6 day h/o acute thoracic pain,   pain is form 8 to 10 plus, aggravated by deep breathing, walking , any upper body  movement. Was seen in the Ed  The week before with chest pain reports that lasix help her , and she had no PE on chest scan  Using 4 pillows , experiencing increased exertional dyspnea and less exercise tolerance     Review of Systems See HPI Denies recent fever or chills. Chronic fatigue and excessive snoriing , also suffers from headaches Denies sinus pressure, nasal congestion, ear pain or sore throat. Denies chest congestion, productive cough or wheezing. Denies h/o otrhopnea  and leg swelling Denies abdominal pain, nausea, vomiting,diarrhea or constipation.   Denies dysuria, frequency, hesitancy or incontinence. Denies headaches, seizures, numbness, or tingling. Denies uncontrolled  depression, anxiety or insomnia. Denies skin break down or rash.        Objective:   Physical Exam  Patient alert and oriented and in no cardiopulmonary distress.Pt in pain  HEENT: No facial asymmetry, EOMI, no sinus tenderness,  oropharynx pink and moist.  Neck supple no adenopathy.  Chest: Clear to auscultation bilaterally.no crackles  CVS: S1, S2 no murmurs, no S3.No JVd  ABD: Soft non tender. Bowel sounds normal.  Ext: one plus edema  MS: decreased  ROM thoracic  Spine,adequate in  shoulders, hips and knees.  Skin: Intact, no ulcerations or rash noted.  Psych: Good eye contact, normal affect. Memory intact not anxious or depressed appearing.  CNS: CN 2-12 intact, power, tone and sensation normal throughout.       Assessment & Plan:

## 2012-12-29 NOTE — Patient Instructions (Addendum)
F/u in 6 weeks, cancel sooner appt if any  Call if you need me before  You are getting toradol 60mg  Im and depo medrol 80 mg im in the office for mid back pain, also prednisone will be prescribed for 6days and muscle relaxants for 4 weeks. Strong pain pill , one at night   only if needed for 10 days  You are referred fro an mRI of your mid back  You are referred to cardiology for evalaution of heart failure   Yoou are referred to Dr Gerilyn Pilgrim for evaluation for sleep study  Blood pressure is excellent today  Increase furosemide to one twice daily, and also you need to start potassium one daily.. Chem 7 non fast next Monday please

## 2012-12-31 ENCOUNTER — Ambulatory Visit (HOSPITAL_COMMUNITY)
Admission: RE | Admit: 2012-12-31 | Discharge: 2012-12-31 | Disposition: A | Discharge status: Home / Self Care | Payer: Medicare Other | Source: Ambulatory Visit | Attending: Family Medicine | Admitting: Family Medicine

## 2012-12-31 ENCOUNTER — Telehealth: Payer: Self-pay | Admitting: *Deleted

## 2012-12-31 DIAGNOSIS — M546 Pain in thoracic spine: Secondary | ICD-10-CM

## 2012-12-31 NOTE — Telephone Encounter (Signed)
Pt called saying she had a appt. For a MRI today, states she couldn't do it because she is very claustrophobic pt would like to have her MRI off of [address]. [phone]

## 2013-01-01 NOTE — Telephone Encounter (Signed)
Is this ok?

## 2013-01-01 NOTE — Telephone Encounter (Signed)
pls reschedule at the open MRI

## 2013-01-03 DIAGNOSIS — R0683 Snoring: Secondary | ICD-10-CM | POA: Insufficient documentation

## 2013-01-03 NOTE — Assessment & Plan Note (Signed)
Severe and uncontrolled , recently in Ed Anti inflammatories in office followed by oral prednisone, needs MRI of mid back

## 2013-01-03 NOTE — Assessment & Plan Note (Signed)
Controlled, no change in medication DASH diet and commitment to daily physical activity for a minimum of 30 minutes discussed and encouraged, as a part of hypertension management. The importance of attaining a healthy weight is also discussed.  

## 2013-01-03 NOTE — Assessment & Plan Note (Signed)
C/o decreased exercise tolerance and leg swelling requiring increase in diuretic dose, refer to cardiology for eval for CHF

## 2013-01-03 NOTE — Assessment & Plan Note (Signed)
Controlled, no change in medication  

## 2013-01-03 NOTE — Assessment & Plan Note (Signed)
H/o snoring, chronic fatigue and morbid obesity. Refer for sleep study

## 2013-01-03 NOTE — Assessment & Plan Note (Signed)
Patient educated about the importance of limiting  Carbohydrate intake , the need to commit to daily physical activity for a minimum of 30 minutes , and to commit weight loss. The fact that changes in all these areas will reduce or eliminate all together the development of diabetes is stressed.    

## 2013-01-06 ENCOUNTER — Encounter: Payer: Self-pay | Admitting: Physician Assistant

## 2013-01-06 ENCOUNTER — Ambulatory Visit (INDEPENDENT_AMBULATORY_CARE_PROVIDER_SITE_OTHER): Payer: Medicare Other | Admitting: Physician Assistant

## 2013-01-06 DIAGNOSIS — I5032 Chronic diastolic (congestive) heart failure: Secondary | ICD-10-CM | POA: Insufficient documentation

## 2013-01-06 DIAGNOSIS — I10 Essential (primary) hypertension: Secondary | ICD-10-CM

## 2013-01-06 MED ORDER — TRIAMTERENE 50 MG PO CAPS: 50.0000 mg | ORAL_CAPSULE | Freq: Every day | ORAL | Status: DC

## 2013-01-06 NOTE — Progress Notes (Signed)
HPI:  This is a very pleasant 52 year old African American female patient of Dr. Dietrich Pates who has history of hypertension, decreased exercise tolerance, and prior history of syncope which hasn't happened in over 5 years. We were asked to reevaluate her by Dr. Lodema Hong because of recent trouble with CHF. She went to the emergency room earlier this month with worsening dyspnea. Her BNP was 370. Her d-dimer was slightly elevated at 0.78, CT showed no evidence of pulmonary embolus. The patient was placed on Lasix in addition to her triamterene hydrochlorothiazide. She says it got rid of her fluid but she complains of significant weakness and urinating all morning long when she takes this combination. She would like to change her blood pressure medicine. She denies any chest pain, palpitations, dyspnea, dyspnea on exertion, edema, dizziness, or presyncope. She is having a CT of her back for chronic back pain. She was eating excessively because of stress with her parents when all this happened.  2-D echo in April 2014 showed normal LV function ejection fraction 55-60% with mild LVH could not evaluate for diastolic function.  Allergies  -- Aleve [Naproxen Sodium] -- Shortness Of Breath  -- Penicillins -- Swelling   --  To face.  -- Latex -- Rash  Current Outpatient Prescriptions on File Prior to Visit: cyclobenzaprine (FLEXERIL) 10 MG tablet, Take 1 tablet (10 mg total) by mouth 3 (three) times daily as needed for muscle spasms., Disp: 30 tablet, Rfl: 0 ergocalciferol (VITAMIN D2) 50000 UNITS capsule, Take 1 capsule (50,000 Units total) by mouth once a week. One capsule once weekly, Disp: 12 capsule, Rfl: 1 FLUoxetine (PROZAC) 20 MG capsule, Take 20 mg by mouth daily., Disp: , Rfl:   furosemide (LASIX) 20 MG tablet, Take 1 tablet (20 mg total) by mouth 2 (two) times daily., Disp: 60 tablet, Rfl: 11 potassium chloride SA (K-DUR,KLOR-CON) 20 MEQ tablet, Take 1 tablet (20 mEq total) by mouth daily., Disp: 30  tablet, Rfl: 4 triamterene-hydrochlorothiazide (MAXZIDE-25) 37.5-25 MG per tablet, Take 1 tablet by mouth daily., Disp: 30 tablet, Rfl: 11 phentermine (ADIPEX-P) 37.5 MG tablet, Take 1 tablet (37.5 mg total) by mouth daily before breakfast., Disp: 30 tablet, Rfl: 1  No current facility-administered medications on file prior to visit.   Past Medical History:   Morbid obesity                                               Migraine headache                                            Depression                                      2001           Comment:Hospital admission in 2001 & 2010   Degenerative joint disease                                     Comment:Left TKA in 07/2009-Dr. Landau   Prediabetes  Chronic back pain                                            Hypothyroidism                                  07/04/2011      Comment:Left lobectomy in 2004   Syncope                                                        Comment:with palpitations; evaluated by [facility] in 2008 with normal echo  Past Surgical History:   ABDOMINAL HYSTERECTOMY                           1995           Comment:no oophorectomy   CHOLECYSTECTOMY, LAPAROSCOPIC                    2007           Comment:Dr. Magda Bernheim, PARTIAL                           2004           Comment:Left lobectomy - large benign nodule   KNEE ARTHROSCOPY                                 1995           Comment:Dr. Whitfield    TOTAL KNEE ARTHROPLASTY                          07/2009         Comment:Left knee , Dr. Dion Saucier    CARPAL TUNNEL RELEASE                            1991           Comment:Bilateral   CRANIECTOMY SUBOCCIPITAL W/ CERVICAL LAMINECTO*  2007           Comment:Repair of Arnold-Chiari malformation   TONSILLECTOMY                                                 CESAREAN SECTION                                                 Comment:X2   CHOLECYSTECTOMY  JOINT REPLACEMENT                                            Review of patient's family history indicates:   Diabetes                       Mother                   Diabetes                       Father                     Comment: emphsema    Hyperlipidemia                 Father                   Social History   Marital Status: Divorced            Spouse Name:                      Years of Education:                 Number of children: 3           Occupational History Occupation          Associate Professor            Comment              Employed seeking d*                       Social History Main Topics   Smoking Status: Never Smoker                     Smokeless Status: Not on file                      Alcohol Use: No             Drug Use: No             Sexual Activity: Not on file        Other Topics            Concern   None on file  Social History Narrative   None on file    ROS: Chronic back pain currently being evaluated with a CT otherwise see history of present illness   PHYSICAL EXAM: Obese, in no acute distress. Neck: No JVD, HJR, Bruit, or thyroid enlargement  Lungs: No tachypnea, clear without wheezing, rales, or rhonchi  Cardiovascular: RRR, PMI not displaced, heart sounds normal, no murmurs, gallops, bruit, thrill, or heave.  Abdomen: BS normal. Soft without organomegaly, masses, lesions or tenderness.  Extremities: without cyanosis, clubbing or edema. Good distal pulses bilateral  SKin: Warm, no lesions or rashes   Musculoskeletal: No deformities  Neuro: no focal signs  BP 116/72  Pulse 71  Ht 5\' 2"  (1.575 m)  Wt 372 lb (168.738 kg)  BMI 68.02 kg/m2  SpO2 97%

## 2013-01-06 NOTE — Assessment & Plan Note (Signed)
Patient had evidence of heart failure in the emergency room recently. She is improved on oral Lasix. Her BNP was elevated and her chest x-ray showed mild CHF. Her 2-D echo in April was unable to evaluate diastolic function. I suspect she has diastolic heart failure. We will continue Lasix 40 mg daily. She wants to come off the hydrochlorothiazide which is reasonable. We will prescribe Maxzide 50 mg once daily. She will see Dr. Lodema Hong for followup of blood pressure. She should see [place] back in 6 months.

## 2013-01-06 NOTE — Assessment & Plan Note (Signed)
Weight loss plan recommended

## 2013-01-06 NOTE — Assessment & Plan Note (Signed)
Controlled. Will prescribe Maxzide 50 mg once daily without HCTZ. Follow up with Dr. Lodema Hong for blood pressure

## 2013-01-06 NOTE — Patient Instructions (Addendum)
Your physician recommends that you schedule a follow-up appointment in: 6 MONTHS WITH Dr Wyline Mood  Your physician has recommended you make the following change in your medication:   1) STOP TAKING MAXIDE (HCTZ COMBINATION TABLET) 2) START TAKING TRIAMTERENE 50MG  ONCE DAILY

## 2013-01-10 ENCOUNTER — Other Ambulatory Visit: Payer: Self-pay | Admitting: Family Medicine

## 2013-01-10 ENCOUNTER — Ambulatory Visit
Admission: RE | Admit: 2013-01-10 | Discharge: 2013-01-10 | Disposition: A | Discharge status: Home / Self Care | Payer: Medicare Other | Source: Ambulatory Visit | Attending: Family Medicine | Admitting: Family Medicine

## 2013-01-10 DIAGNOSIS — M546 Pain in thoracic spine: Secondary | ICD-10-CM

## 2013-01-15 ENCOUNTER — Other Ambulatory Visit: Payer: Self-pay | Admitting: Family Medicine

## 2013-01-15 DIAGNOSIS — M546 Pain in thoracic spine: Secondary | ICD-10-CM

## 2013-01-18 ENCOUNTER — Other Ambulatory Visit: Payer: Self-pay | Admitting: Family Medicine

## 2013-01-18 DIAGNOSIS — M546 Pain in thoracic spine: Secondary | ICD-10-CM

## 2013-01-19 ENCOUNTER — Ambulatory Visit
Admission: RE | Admit: 2013-01-19 | Discharge: 2013-01-19 | Disposition: A | Discharge status: Home / Self Care | Payer: Medicare Other | Source: Ambulatory Visit | Attending: Family Medicine | Admitting: Family Medicine

## 2013-01-19 VITALS — BP 154/88 | HR 60 | Ht 62.0 in | Wt 375.2 lb

## 2013-01-19 DIAGNOSIS — M546 Pain in thoracic spine: Secondary | ICD-10-CM

## 2013-01-19 MED ORDER — TRIAMCINOLONE ACETONIDE 40 MG/ML IJ SUSP (RADIOLOGY)
60.0000 mg | Freq: Once | INTRAMUSCULAR | Status: AC
  Administered 2013-01-19: 60 mg via EPIDURAL

## 2013-01-19 MED ORDER — IOHEXOL 300 MG/ML  SOLN
1.0000 mL | Freq: Once | INTRAMUSCULAR | Status: AC | PRN
  Administered 2013-01-19: 1 mL via EPIDURAL

## 2013-01-21 ENCOUNTER — Other Ambulatory Visit: Payer: Self-pay | Admitting: Family Medicine

## 2013-01-21 DIAGNOSIS — M546 Pain in thoracic spine: Secondary | ICD-10-CM

## 2013-01-27 ENCOUNTER — Telehealth: Payer: Self-pay | Admitting: Family Medicine

## 2013-01-27 NOTE — Telephone Encounter (Signed)
Patient has appointment with DR. [place] 11.24.2014 at 3:00

## 2013-02-03 ENCOUNTER — Telehealth (INDEPENDENT_AMBULATORY_CARE_PROVIDER_SITE_OTHER): Payer: Self-pay | Admitting: *Deleted

## 2013-02-03 NOTE — Telephone Encounter (Signed)
agree

## 2013-02-03 NOTE — Telephone Encounter (Signed)
  Procedure: tcs  Reason/Indication:  screening  Has patient had this procedure before?  no  If so, when, by whom and where?    Is there a family history of colon cancer?  no  Who?  What age when diagnosed?    Is patient diabetic?   no      Does patient have prosthetic heart valve?  no  Do you have a pacemaker?  no  Has patient ever had endocarditis? no  Has patient had joint replacement within last 12 months?  no  Does patient tend to be constipated or take laxatives? no  Is patient on Coumadin, Plavix and/or Aspirin? no  Medications: see EPIC  Allergies: nkda  Medication Adjustment:   Procedure date & time: 03/03/13 at 930

## 2013-02-17 ENCOUNTER — Encounter (HOSPITAL_COMMUNITY): Payer: Self-pay | Admitting: Pharmacy Technician

## 2013-03-02 ENCOUNTER — Other Ambulatory Visit: Payer: Self-pay | Admitting: Family Medicine

## 2013-03-03 ENCOUNTER — Encounter (HOSPITAL_COMMUNITY): Payer: Self-pay | Admitting: *Deleted

## 2013-03-03 ENCOUNTER — Ambulatory Visit (HOSPITAL_COMMUNITY)
Admission: RE | Admit: 2013-03-03 | Discharge: 2013-03-03 | Disposition: A | Discharge status: Home / Self Care | Payer: Medicare Other | Source: Ambulatory Visit | Attending: Internal Medicine | Admitting: Internal Medicine

## 2013-03-03 ENCOUNTER — Encounter (HOSPITAL_COMMUNITY)
Admission: RE | Disposition: A | Discharge status: Home / Self Care | Payer: Self-pay | Source: Ambulatory Visit | Attending: Internal Medicine

## 2013-03-03 DIAGNOSIS — K644 Residual hemorrhoidal skin tags: Secondary | ICD-10-CM

## 2013-03-03 DIAGNOSIS — Z6841 Body Mass Index (BMI) 40.0 and over, adult: Secondary | ICD-10-CM | POA: Insufficient documentation

## 2013-03-03 DIAGNOSIS — Z1211 Encounter for screening for malignant neoplasm of colon: Secondary | ICD-10-CM

## 2013-03-03 HISTORY — PX: COLONOSCOPY: SHX5424

## 2013-03-03 SURGERY — COLONOSCOPY
Anesthesia: Moderate Sedation

## 2013-03-03 MED ORDER — MEPERIDINE HCL 50 MG/ML IJ SOLN
INTRAMUSCULAR | Status: AC
  Filled 2013-03-03: qty 1

## 2013-03-03 MED ORDER — MIDAZOLAM HCL 5 MG/5ML IJ SOLN
INTRAMUSCULAR | Status: DC | PRN
  Administered 2013-03-03 (×5): 2 mg via INTRAVENOUS

## 2013-03-03 MED ORDER — MEPERIDINE HCL 50 MG/ML IJ SOLN
INTRAMUSCULAR | Status: DC | PRN
  Administered 2013-03-03 (×2): 25 mg via INTRAVENOUS

## 2013-03-03 MED ORDER — SODIUM CHLORIDE 0.9 % IV SOLN
INTRAVENOUS | Status: DC
  Administered 2013-03-03: 09:00:00 via INTRAVENOUS

## 2013-03-03 MED ORDER — MIDAZOLAM HCL 5 MG/5ML IJ SOLN
INTRAMUSCULAR | Status: AC
  Filled 2013-03-03: qty 10

## 2013-03-03 MED ORDER — STERILE WATER FOR IRRIGATION IR SOLN
Status: DC | PRN
  Administered 2013-03-03: 10:00:00

## 2013-03-03 NOTE — H&P (Signed)
[name] is an 52 y.o. female.   Chief Complaint: Patient is here for colonoscopy. HPI: Patient is 52 year old African female who is here for screening examination. She denies abdominal pain change in bowel habits or rectal bleeding. Family  is significant for colon carcinoma in her father who was 13 at the time of diagnosis and is doing well 4 years later.  Past Medical History  Diagnosis Date  . Morbid obesity   . Migraine headache   . Depression [facility] admission in 2001 & 2010  . Degenerative joint disease     Left TKA in 07/2009-Dr. Landau  . Prediabetes   . Chronic back pain   . Hypothyroidism 07/04/2011    Left lobectomy in 2004  . Syncope     with palpitations; evaluated by [facility] in 2008 with normal echo    Past Surgical History  Procedure Laterality Date  . Abdominal hysterectomy  1995     no oophorectomy  . Cholecystectomy, laparoscopic  2007    Dr. Lovell Sheehan  . Thyroidectomy, partial  2004    Left lobectomy - large benign nodule  . Knee arthroscopy  1995    Dr. Cleophas Dunker   . Total knee arthroplasty  07/2009    Left knee , Dr. Dion Saucier   . Carpal tunnel release  1991    Bilateral  . Craniectomy suboccipital w/ cervical laminectomy / chiari  2007    Repair of Arnold-Chiari malformation  . Tonsillectomy    . Cesarean section      X2  . Cholecystectomy    . Joint replacement Left 2011    Family History  Problem Relation Age of Onset  . Diabetes Mother   . Diabetes Father     emphsema   . Hyperlipidemia Father   . Colon cancer Neg Hx    Social History:  reports that she has never smoked. She does not have any smokeless tobacco history on file. She reports that she does not drink alcohol or use illicit drugs.  Allergies:  Allergies  Allergen Reactions  . Aleve [Naproxen Sodium] Shortness Of Breath  . Penicillins Swelling    Facial Area   . Latex Rash    Medications Prior to Admission  Medication Sig Dispense  Refill  . FLUoxetine (PROZAC) 20 MG capsule Take 20 mg by mouth daily.      Marland Kitchen ibuprofen (ADVIL,MOTRIN) 800 MG tablet Take 800 mg by mouth every 8 (eight) hours as needed for moderate pain.         No results found for this or any previous visit (from the past 48 hour(s)). No results found.  ROS  Blood pressure 131/61, pulse 69, temperature 97.8 F (36.6 C), temperature source Oral, resp. rate 14, height 5\' 2"  (1.575 m), weight 372 lb (168.738 kg), SpO2 96.00%. Physical Exam  Constitutional:  Well-developed obese African American female  HENT:  Mouth/Throat: Oropharynx is clear and moist.  Eyes: Conjunctivae are normal. No scleral icterus.  Neck: No thyromegaly present.  Cardiovascular: Normal rate, regular rhythm and normal heart sounds.   No murmur heard. Respiratory: Effort normal and breath sounds normal.  GI: Soft. She exhibits no distension and no mass. There is no tenderness.  Musculoskeletal: She exhibits no edema.  Lymphadenopathy:    She has no cervical adenopathy.  Neurological: She is alert.  Skin: Skin is warm and dry.     Assessment/Plan Screening colonoscopy. Family history of colon carcinoma in father at late  onset.  Bearett Porcaro U 03/03/2013, 9:37 AM

## 2013-03-03 NOTE — Op Note (Signed)
COLONOSCOPY PROCEDURE REPORT  PATIENT:  [name]  MR#:  [id number] Birthdate:  07-07-1960, 52 y.o., female Endoscopist:  Dr. Malissa Hippo, MD Referred By:  Dr. [name], MD  Procedure Date: 03/03/2013  Procedure:   Colonoscopy  Indications: Patient is 52 year old African female who is undergoing screening colonoscopy. Her risk would appear to be average since her father was 36 at the time of diagnosis and is doing fine at age 58.  Informed Consent:  The procedure and risks were reviewed with the patient and informed consent was obtained.  Medications:  Demerol 50 mg IV Versed 10 mg IV  Description of procedure:  After a digital rectal exam was performed, that colonoscope was advanced from the anus through the rectum and colon to the area of the cecum, ileocecal valve and appendiceal orifice. The cecum was deeply intubated. These structures were well-seen and photographed for the record. From the level of the cecum and ileocecal valve, the scope was slowly and cautiously withdrawn. The mucosal surfaces were carefully surveyed utilizing scope tip to flexion to facilitate fold flattening as needed. The scope was pulled down into the rectum where a thorough exam including retroflexion was performed.  Findings:   Prep satisfactory. Normal mucosa of cecum and various segments of colon. Normal rectal mucosa. Small hemorrhoids noted below the dentate line.  Therapeutic/Diagnostic Maneuvers Performed:   None  Complications:  None  Cecal Withdrawal Time:  10 minutes  Impression:  Small external hemorrhoids otherwise normal colonoscopy.  Recommendations:  Standard instructions given. Next screening exam in 10 years.  Patsy Varma U  03/03/2013 10:08 AM  CC: Dr. [name], MD & Dr. Bonnetta Barry ref. provider found

## 2013-03-04 ENCOUNTER — Ambulatory Visit: Payer: Medicare Other | Admitting: Family Medicine

## 2013-03-05 ENCOUNTER — Encounter (HOSPITAL_COMMUNITY): Payer: Self-pay | Admitting: Internal Medicine

## 2013-06-24 ENCOUNTER — Other Ambulatory Visit: Payer: Self-pay | Admitting: Orthopedic Surgery

## 2013-06-28 ENCOUNTER — Encounter (HOSPITAL_COMMUNITY): Payer: Self-pay | Admitting: Pharmacy Technician

## 2013-07-02 NOTE — Pre-Procedure Instructions (Signed)
[name]  07/02/2013   Your procedure is scheduled on:  Tues, April 28 @ 7:30 AM  Report to [facility]  at 5:30 AM.  Call this number if you have problems the morning of surgery: [phone]   Remember:   Do not eat food or drink liquids after midnight.      Do not wear jewelry, make-up or nail polish.  Do not wear lotions, powders, or perfumes. You may wear deodorant.  Do not shave 48 hours prior to surgery.  Do not bring valuables to the hospital.  [facility] is not responsible                  for any belongings or valuables.               Contacts, dentures or bridgework may not be worn into surgery.  Leave suitcase in the car. After surgery it may be brought to your room.  For patients admitted to the hospital, discharge time is determined by your                treatment team.               Patients discharged the day of surgery will not be allowed to drive  home.    Special Instructions:  [place] - Preparing for Surgery  Before surgery, you can play an important role.  Because skin is not sterile, your skin needs to be as free of germs as possible.  You can reduce the number of germs on you skin by washing with CHG (chlorahexidine gluconate) soap before surgery.  CHG is an antiseptic cleaner which kills germs and bonds with the skin to continue killing germs even after washing.  Please DO NOT use if you have an allergy to CHG or antibacterial soaps.  If your skin becomes reddened/irritated stop using the CHG and inform your nurse when you arrive at Short Stay.  Do not shave (including legs and underarms) for at least 48 hours prior to the first CHG shower.  You may shave your face.  Please follow these instructions carefully:   1.  Shower with CHG Soap the night before surgery and the                                morning of Surgery.  2.  If you choose to wash your hair, wash your hair first as usual with your       normal shampoo.  3.  After  you shampoo, rinse your hair and body thoroughly to remove the                      Shampoo.  4.  Use CHG as you would any other liquid soap.  You can apply chg directly       to the skin and wash gently with scrungie or a clean washcloth.  5.  Apply the CHG Soap to your body ONLY FROM THE NECK DOWN.        Do not use on open wounds or open sores.  Avoid contact with your eyes,       ears, mouth and genitals (private parts).  Wash genitals (private parts)       with your normal soap.  6.  Wash thoroughly, paying special attention to the area where your surgery  will be performed.  7.  Thoroughly rinse your body with warm water from the neck down.  8.  DO NOT shower/wash with your normal soap after using and rinsing off       the CHG Soap.  9.  Pat yourself dry with a clean towel.            10.  Wear clean pajamas.            11.  Place clean sheets on your bed the night of your first shower and do not        sleep with pets.  Day of Surgery  Do not apply any lotions/deoderants the morning of surgery.  Please wear clean clothes to the hospital/surgery center.     Please read over the following fact sheets that you were given: Pain Booklet, Coughing and Deep Breathing, Blood Transfusion Information, MRSA Information and Surgical Site Infection Prevention

## 2013-07-05 ENCOUNTER — Encounter (HOSPITAL_COMMUNITY)
Admission: RE | Admit: 2013-07-05 | Discharge: 2013-07-05 | Disposition: A | Discharge status: Home / Self Care | Payer: Medicare Other | Source: Ambulatory Visit | Attending: Orthopedic Surgery | Admitting: Orthopedic Surgery

## 2013-07-05 ENCOUNTER — Encounter (HOSPITAL_COMMUNITY): Payer: Self-pay

## 2013-07-05 DIAGNOSIS — Z01810 Encounter for preprocedural cardiovascular examination: Secondary | ICD-10-CM | POA: Insufficient documentation

## 2013-07-05 HISTORY — DX: Headache: R51

## 2013-07-05 HISTORY — DX: Localized edema: R60.0

## 2013-07-05 HISTORY — DX: Edema, unspecified: R60.9

## 2013-07-05 HISTORY — DX: Pain in unspecified joint: M25.50

## 2013-07-05 HISTORY — DX: Nocturia: R35.1

## 2013-07-05 HISTORY — DX: Effusion, unspecified joint: M25.40

## 2013-07-05 LAB — BASIC METABOLIC PANEL
BUN: 10 mg/dL (ref 6–23)
CHLORIDE: 104 meq/L (ref 96–112)
CO2: 26 mEq/L (ref 19–32)
Calcium: 9.3 mg/dL (ref 8.4–10.5)
Creatinine, Ser: 0.6 mg/dL (ref 0.50–1.10)
GFR calc non Af Amer: >90 mL/min (ref >90)
Glucose, Bld: 97 mg/dL (ref 70–99)
POTASSIUM: 4.4 meq/L (ref 3.7–5.3)
Sodium: 142 mEq/L (ref 137–147)

## 2013-07-05 LAB — CBC
HCT: 38.8 % (ref 36.0–46.0)
HEMOGLOBIN: 11.9 g/dL — AB (ref 12.0–15.0)
MCH: 23.8 pg — ABNORMAL LOW (ref 26.0–34.0)
MCHC: 30.7 g/dL (ref 30.0–36.0)
MCV: 77.6 fL — ABNORMAL LOW (ref 78.0–100.0)
Platelets: 365 10*3/uL (ref 150–400)
RBC: 5 MIL/uL (ref 3.87–5.11)
RDW: 15.9 % — ABNORMAL HIGH (ref 11.5–15.5)
WBC: 8 10*3/uL (ref 4.0–10.5)

## 2013-07-05 LAB — PROTIME-INR
INR: 1.19 (ref 0.00–1.49)
Prothrombin Time: 14.8 seconds (ref 11.6–15.2)

## 2013-07-05 LAB — APTT: APTT: 33 s (ref 24–37)

## 2013-07-05 LAB — TYPE AND SCREEN
ABO/RH(D): B POS
ANTIBODY SCREEN: NEGATIVE

## 2013-07-05 LAB — SURGICAL PCR SCREEN
MRSA, PCR: NEGATIVE
Staphylococcus aureus: NEGATIVE

## 2013-07-05 NOTE — Progress Notes (Addendum)
Cardiologist is Dr.Rothbart with [facility] in [place]-saw him in 2013 ago b/c was getting tired a lot but everything checked out ok  Denies ever having a heart cath/echo/stress test/  Denies EKG or CXR in past yr    Medical Md is Dr.Margaret Lodema Hong

## 2013-07-08 ENCOUNTER — Ambulatory Visit (INDEPENDENT_AMBULATORY_CARE_PROVIDER_SITE_OTHER): Payer: Medicare Other | Admitting: Family Medicine

## 2013-07-08 ENCOUNTER — Encounter: Payer: Self-pay | Admitting: Family Medicine

## 2013-07-08 VITALS — BP 160/100 | HR 66 | Resp 16 | Ht 62.0 in | Wt 376.4 lb

## 2013-07-08 DIAGNOSIS — I10 Essential (primary) hypertension: Secondary | ICD-10-CM

## 2013-07-08 DIAGNOSIS — M545 Low back pain, unspecified: Secondary | ICD-10-CM

## 2013-07-08 DIAGNOSIS — F32A Depression, unspecified: Secondary | ICD-10-CM

## 2013-07-08 DIAGNOSIS — M79605 Pain in left leg: Secondary | ICD-10-CM

## 2013-07-08 DIAGNOSIS — F3289 Other specified depressive episodes: Secondary | ICD-10-CM

## 2013-07-08 DIAGNOSIS — E559 Vitamin D deficiency, unspecified: Secondary | ICD-10-CM

## 2013-07-08 DIAGNOSIS — R7303 Prediabetes: Secondary | ICD-10-CM

## 2013-07-08 DIAGNOSIS — F329 Major depressive disorder, single episode, unspecified: Secondary | ICD-10-CM

## 2013-07-08 DIAGNOSIS — R7309 Other abnormal glucose: Secondary | ICD-10-CM

## 2013-07-08 MED ORDER — HYDROCODONE-ACETAMINOPHEN 5-325 MG PO TABS: ORAL_TABLET | ORAL | Status: DC

## 2013-07-08 MED ORDER — TRIAMTERENE-HCTZ 37.5-25 MG PO TABS: 1.0000 | ORAL_TABLET | Freq: Every day | ORAL | Status: DC

## 2013-07-08 MED ORDER — GABAPENTIN 300 MG PO CAPS: 300.0000 mg | ORAL_CAPSULE | Freq: Two times a day (BID) | ORAL | Status: DC

## 2013-07-08 NOTE — Patient Instructions (Addendum)
F/u in 3 month, call if you need m before  New for back pain is gabapentin, start with one daily, and increase to two if needed  Short course of hydrocodone for back pain , one at night to last till upcoming right knee surgery next Tuesday (7)  You should contact surgeon if back is debilitating  BP TOO high, take medication, eVERY DAY   HBA1C, Vit D today

## 2013-07-09 LAB — HEMOGLOBIN A1C
Hgb A1c MFr Bld: 6.2 % — ABNORMAL HIGH (ref <5.7)
Mean Plasma Glucose: 131 mg/dL — ABNORMAL HIGH (ref <117)

## 2013-07-11 NOTE — Assessment & Plan Note (Signed)
Reports stability off of medication which she discontinued independently for over 4 month, not seeing therapist either. Accompanied by her eldest daughter who states "she is crazy". Not currently suicidal , homicidal , tearful, or hallucinating

## 2013-07-11 NOTE — Assessment & Plan Note (Signed)
Unchanged Patient educated about the importance of limiting  Carbohydrate intake , the need to commit to daily physical activity for a minimum of 30 minutes , and to commit weight loss. The fact that changes in all these areas will reduce or eliminate all together the development of diabetes is stressed.    

## 2013-07-11 NOTE — Assessment & Plan Note (Signed)
Acute , severe and uncontrolled pain, no precipitating factor know. Has knee surgery within the next week, hence anti inflammatories can't be used.,, trial of gabapentin and limited hydrocodone,advised to contact ortho if not much improved in next 3 to 4 days, as surgery may need to be deferred

## 2013-07-11 NOTE — Progress Notes (Signed)
Subjective:    Patient ID: [name], female    DOB: 1961/03/08, 53 y.o.   MRN: [id number]  HPI The PT is here for follow up and re-evaluation of chronic medical conditions, medication management and review of any available recent lab and radiology data.  Preventive health is updated, specifically  Cancer screening and Immunization.   Questions or concerns regarding consultations or procedures which the PT has had in the interim are  Addressed.Has surgery on knee next week, looking forward to this as she is in constant pain The PT denies any adverse reactions to current medications since the last visit. 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 Denies sinus pressure, nasal congestion, ear pain or sore throat. Denies chest congestion, productive cough or wheezing. Denies chest pains, palpitations and leg swelling Denies abdominal pain, nausea, vomiting,diarrhea or constipation.   Denies dysuria, frequency, hesitancy or incontinence. . Denies headaches, seizures, numbness, or tingling.  Denies skin break down or rash.        Objective:   Physical Exam BP 160/100  Pulse 66  Resp 16  Ht 5\' 2"  (1.575 m)  Wt 376 lb 6.4 oz (170.734 kg)  BMI 68.83 kg/m2  SpO2 97% Patient alert and oriented and in no cardiopulmonary distress.P tin pain with limitation in nmobility  HEENT: No facial asymmetry, EOMI, no sinus tenderness,  oropharynx pink and moist.  Neck supple no adenopathy.  Chest: Clear to auscultation bilaterally.  CVS: S1, S2 no murmurs, no S3.  ABD: Soft non tender. Bowel sounds normal.  Ext: No edema  MS: decreased  ROM spine,due to pain also reduced ROM in left   hip and  knee.  Skin: Intact, no ulcerations or rash noted.  Psych: Good eye contact, normal affect. Memory intact not anxious or depressed appearing.  CNS: CN 2-12 intact, power, tone and sensation normal throughout.        Assessment & Plan:  Lumbar pain with radiation down left leg Acute , severe and uncontrolled pain, no precipitating factor know. Has knee surgery within the next week, hence anti inflammatories can't be used.,, trial of gabapentin and limited hydrocodone,advised to contact ortho if not much improved in next 3 to 4 days, as surgery may need to be deferred  Morbid obesity Deteriorated. Patient re-educated about  the importance of commitment to a  minimum of 150 minutes of exercise per week. The importance of healthy food choices with portion control discussed. Encouraged to start a food diary, count calories and to consider  joining a support group. Sample diet sheets offered. Goals set by the patient for the next several months.     Depression Reports stability off of medication which she discontinued independently for over 4 month, not seeing therapist either. Accompanied by her eldest daughter who states "she is crazy". Not currently suicidal , homicidal , tearful, or hallucinating  Prediabetes Unchanged. Patient educated about the importance of limiting  Carbohydrate intake , the need to commit to daily physical activity for a minimum of 30 minutes , and to commit weight loss. The fact that changes in all these areas will reduce or eliminate all together the development of diabetes is stressed.     Unspecified essential  hypertension Uncontrolled and worsened, start medication DASH diet and commitment to daily physical activity for a minimum of 30 minutes discussed and encouraged, as a part of hypertension management. The importance of attaining a healthy weight is also discussed.   Unspecified vitamin D deficiency Low vit d ad high fall risk, check lab,  likely needs supplement

## 2013-07-11 NOTE — Assessment & Plan Note (Signed)
Low vit d ad high fall risk, check lab, likely needs supplement

## 2013-07-11 NOTE — Assessment & Plan Note (Signed)
Uncontrolled and worsened, start medication DASH diet and commitment to daily physical activity for a minimum of 30 minutes discussed and encouraged, as a part of hypertension management. The importance of attaining a healthy weight is also discussed.

## 2013-07-11 NOTE — Assessment & Plan Note (Signed)
Deteriorated. Patient re-educated about  the importance of commitment to a  minimum of 150 minutes of exercise per week. The importance of healthy food choices with portion control discussed. Encouraged to start a food diary, count calories and to consider  joining a support group. Sample diet sheets offered. Goals set by the patient for the next several months.    

## 2013-07-12 MED ORDER — VANCOMYCIN HCL 10 G IV SOLR
1500.0000 mg | INTRAVENOUS | Status: AC
  Administered 2013-07-13: 1500 mg via INTRAVENOUS
  Filled 2013-07-12: qty 1500

## 2013-07-13 ENCOUNTER — Inpatient Hospital Stay (HOSPITAL_COMMUNITY)
Admission: RE | Admit: 2013-07-13 | Discharge: 2013-07-15 | DRG: 470 | Disposition: A | Discharge status: Home Health | Payer: Medicare Other | Source: Ambulatory Visit | Attending: Orthopedic Surgery | Admitting: Orthopedic Surgery

## 2013-07-13 ENCOUNTER — Encounter (HOSPITAL_COMMUNITY)
Admission: RE | Disposition: A | Discharge status: Home Health | Payer: Self-pay | Source: Ambulatory Visit | Attending: Orthopedic Surgery

## 2013-07-13 ENCOUNTER — Inpatient Hospital Stay (HOSPITAL_COMMUNITY): Payer: Medicare Other

## 2013-07-13 ENCOUNTER — Inpatient Hospital Stay (HOSPITAL_COMMUNITY): Payer: Medicare Other | Admitting: Anesthesiology

## 2013-07-13 ENCOUNTER — Encounter (HOSPITAL_COMMUNITY): Payer: Self-pay | Admitting: *Deleted

## 2013-07-13 ENCOUNTER — Encounter (HOSPITAL_COMMUNITY): Payer: Medicare Other | Admitting: Anesthesiology

## 2013-07-13 DIAGNOSIS — M179 Osteoarthritis of knee, unspecified: Secondary | ICD-10-CM | POA: Diagnosis present

## 2013-07-13 DIAGNOSIS — M1710 Unilateral primary osteoarthritis, unspecified knee: Principal | ICD-10-CM | POA: Diagnosis present

## 2013-07-13 DIAGNOSIS — M549 Dorsalgia, unspecified: Secondary | ICD-10-CM | POA: Diagnosis present

## 2013-07-13 DIAGNOSIS — Z91040 Latex allergy status: Secondary | ICD-10-CM

## 2013-07-13 DIAGNOSIS — G8929 Other chronic pain: Secondary | ICD-10-CM | POA: Diagnosis present

## 2013-07-13 DIAGNOSIS — E039 Hypothyroidism, unspecified: Secondary | ICD-10-CM | POA: Diagnosis present

## 2013-07-13 DIAGNOSIS — M1711 Unilateral primary osteoarthritis, right knee: Secondary | ICD-10-CM | POA: Diagnosis present

## 2013-07-13 DIAGNOSIS — Z880 Allergy status to penicillin: Secondary | ICD-10-CM

## 2013-07-13 DIAGNOSIS — Z833 Family history of diabetes mellitus: Secondary | ICD-10-CM

## 2013-07-13 DIAGNOSIS — Z96659 Presence of unspecified artificial knee joint: Secondary | ICD-10-CM

## 2013-07-13 DIAGNOSIS — Z79899 Other long term (current) drug therapy: Secondary | ICD-10-CM

## 2013-07-13 DIAGNOSIS — Z6841 Body Mass Index (BMI) 40.0 and over, adult: Secondary | ICD-10-CM

## 2013-07-13 DIAGNOSIS — Z7901 Long term (current) use of anticoagulants: Secondary | ICD-10-CM

## 2013-07-13 DIAGNOSIS — Z9089 Acquired absence of other organs: Secondary | ICD-10-CM

## 2013-07-13 DIAGNOSIS — Z886 Allergy status to analgesic agent status: Secondary | ICD-10-CM

## 2013-07-13 HISTORY — DX: Unilateral primary osteoarthritis, right knee: M17.11

## 2013-07-13 HISTORY — PX: TOTAL KNEE ARTHROPLASTY: SHX125

## 2013-07-13 LAB — CBC
HCT: 38.5 % (ref 36.0–46.0)
Hemoglobin: 12.2 g/dL (ref 12.0–15.0)
MCH: 24.1 pg — AB (ref 26.0–34.0)
MCHC: 31.7 g/dL (ref 30.0–36.0)
MCV: 76.1 fL — AB (ref 78.0–100.0)
Platelets: 369 10*3/uL (ref 150–400)
RBC: 5.06 MIL/uL (ref 3.87–5.11)
RDW: 15.7 % — ABNORMAL HIGH (ref 11.5–15.5)
WBC: 17.7 10*3/uL — AB (ref 4.0–10.5)

## 2013-07-13 LAB — CREATININE, SERUM
Creatinine, Ser: 0.74 mg/dL (ref 0.50–1.10)
GFR calc Af Amer: >90 mL/min (ref >90)
GFR calc non Af Amer: >90 mL/min (ref >90)

## 2013-07-13 SURGERY — ARTHROPLASTY, KNEE, TOTAL
Anesthesia: General | Site: Knee | Laterality: Right

## 2013-07-13 MED ORDER — BISACODYL 10 MG RE SUPP: 10.0000 mg | Freq: Every day | RECTAL | Status: DC | PRN

## 2013-07-13 MED ORDER — METHOCARBAMOL 100 MG/ML IJ SOLN
500.0000 mg | Freq: Four times a day (QID) | INTRAVENOUS | Status: DC | PRN
  Filled 2013-07-13: qty 5

## 2013-07-13 MED ORDER — METOCLOPRAMIDE HCL 10 MG PO TABS: 5.0000 mg | ORAL_TABLET | Freq: Three times a day (TID) | ORAL | Status: DC | PRN

## 2013-07-13 MED ORDER — OXYCODONE HCL 5 MG PO TABS: 5.0000 mg | ORAL_TABLET | Freq: Once | ORAL | Status: DC | PRN

## 2013-07-13 MED ORDER — MAGNESIUM CITRATE PO SOLN: 1.0000 | Freq: Once | ORAL | Status: AC | PRN

## 2013-07-13 MED ORDER — MIDAZOLAM HCL 5 MG/5ML IJ SOLN
INTRAMUSCULAR | Status: DC | PRN
  Administered 2013-07-13: 2 mg via INTRAVENOUS

## 2013-07-13 MED ORDER — OXYCODONE HCL 5 MG/5ML PO SOLN: 5.0000 mg | Freq: Once | ORAL | Status: DC | PRN

## 2013-07-13 MED ORDER — HYDROMORPHONE HCL PF 1 MG/ML IJ SOLN
INTRAMUSCULAR | Status: AC
  Filled 2013-07-13: qty 1

## 2013-07-13 MED ORDER — TRIAMTERENE-HCTZ 37.5-25 MG PO TABS
1.0000 | ORAL_TABLET | Freq: Every day | ORAL | Status: DC
  Administered 2013-07-13: 1 via ORAL
  Filled 2013-07-13 (×3): qty 1

## 2013-07-13 MED ORDER — ONDANSETRON HCL 4 MG/2ML IJ SOLN
INTRAMUSCULAR | Status: AC
  Filled 2013-07-13: qty 2

## 2013-07-13 MED ORDER — SENNA 8.6 MG PO TABS
1.0000 | ORAL_TABLET | Freq: Two times a day (BID) | ORAL | Status: DC
  Administered 2013-07-13 – 2013-07-15 (×5): 8.6 mg via ORAL
  Filled 2013-07-13 (×6): qty 1

## 2013-07-13 MED ORDER — ACETAMINOPHEN 325 MG PO TABS: 650.0000 mg | ORAL_TABLET | Freq: Four times a day (QID) | ORAL | Status: DC | PRN

## 2013-07-13 MED ORDER — SODIUM CHLORIDE 0.9 % IR SOLN
Status: DC | PRN
  Administered 2013-07-13 (×2): 1000 mL

## 2013-07-13 MED ORDER — HYDROMORPHONE HCL PF 1 MG/ML IJ SOLN
0.2500 mg | INTRAMUSCULAR | Status: DC | PRN
  Administered 2013-07-13 (×4): 0.5 mg via INTRAVENOUS

## 2013-07-13 MED ORDER — ONDANSETRON HCL 4 MG/2ML IJ SOLN
INTRAMUSCULAR | Status: DC | PRN
  Administered 2013-07-13: 4 mg via INTRAVENOUS

## 2013-07-13 MED ORDER — POTASSIUM CHLORIDE IN NACL 20-0.45 MEQ/L-% IV SOLN
INTRAVENOUS | Status: DC
  Administered 2013-07-13: 75 mL/h via INTRAVENOUS
  Administered 2013-07-14: 07:00:00 via INTRAVENOUS
  Filled 2013-07-13 (×5): qty 1000

## 2013-07-13 MED ORDER — PHENOL 1.4 % MT LIQD: 1.0000 | OROMUCOSAL | Status: DC | PRN

## 2013-07-13 MED ORDER — ACETAMINOPHEN 650 MG RE SUPP: 650.0000 mg | Freq: Four times a day (QID) | RECTAL | Status: DC | PRN

## 2013-07-13 MED ORDER — METHOCARBAMOL 500 MG PO TABS: 500.0000 mg | ORAL_TABLET | Freq: Four times a day (QID) | ORAL | Status: DC

## 2013-07-13 MED ORDER — GLYCOPYRROLATE 0.2 MG/ML IJ SOLN
INTRAMUSCULAR | Status: AC
  Filled 2013-07-13: qty 4

## 2013-07-13 MED ORDER — ONDANSETRON HCL 4 MG PO TABS
4.0000 mg | ORAL_TABLET | Freq: Four times a day (QID) | ORAL | Status: DC | PRN
  Filled 2013-07-13: qty 1

## 2013-07-13 MED ORDER — BUPIVACAINE HCL (PF) 0.25 % IJ SOLN
INTRAMUSCULAR | Status: AC
  Filled 2013-07-13: qty 30

## 2013-07-13 MED ORDER — GABAPENTIN 300 MG PO CAPS
300.0000 mg | ORAL_CAPSULE | Freq: Two times a day (BID) | ORAL | Status: DC
  Administered 2013-07-13 – 2013-07-15 (×4): 300 mg via ORAL
  Filled 2013-07-13 (×5): qty 1

## 2013-07-13 MED ORDER — DEXAMETHASONE 6 MG PO TABS
10.0000 mg | ORAL_TABLET | Freq: Three times a day (TID) | ORAL | Status: AC
  Administered 2013-07-13 – 2013-07-14 (×2): 10 mg via ORAL
  Filled 2013-07-13 (×3): qty 1

## 2013-07-13 MED ORDER — ENOXAPARIN SODIUM 30 MG/0.3ML ~~LOC~~ SOLN: 30.0000 mg | Freq: Two times a day (BID) | SUBCUTANEOUS | Status: DC

## 2013-07-13 MED ORDER — OXYCODONE HCL 5 MG PO TABS
5.0000 mg | ORAL_TABLET | ORAL | Status: DC | PRN
  Administered 2013-07-14 – 2013-07-15 (×6): 10 mg via ORAL
  Filled 2013-07-13 (×6): qty 2

## 2013-07-13 MED ORDER — DOCUSATE SODIUM 100 MG PO CAPS
100.0000 mg | ORAL_CAPSULE | Freq: Two times a day (BID) | ORAL | Status: DC
  Administered 2013-07-13 – 2013-07-15 (×5): 100 mg via ORAL
  Filled 2013-07-13 (×6): qty 1

## 2013-07-13 MED ORDER — ALUM & MAG HYDROXIDE-SIMETH 200-200-20 MG/5ML PO SUSP: 30.0000 mL | ORAL | Status: DC | PRN

## 2013-07-13 MED ORDER — PROPOFOL 10 MG/ML IV BOLUS
INTRAVENOUS | Status: DC | PRN
  Administered 2013-07-13: 200 mg via INTRAVENOUS

## 2013-07-13 MED ORDER — LACTATED RINGERS IV SOLN
INTRAVENOUS | Status: DC | PRN
  Administered 2013-07-13 (×2): via INTRAVENOUS

## 2013-07-13 MED ORDER — NEOSTIGMINE METHYLSULFATE 1 MG/ML IJ SOLN
INTRAMUSCULAR | Status: AC
  Filled 2013-07-13: qty 10

## 2013-07-13 MED ORDER — DEXAMETHASONE SODIUM PHOSPHATE 10 MG/ML IJ SOLN
INTRAMUSCULAR | Status: AC
  Filled 2013-07-13: qty 1

## 2013-07-13 MED ORDER — PROMETHAZINE HCL 25 MG PO TABS: 25.0000 mg | ORAL_TABLET | Freq: Four times a day (QID) | ORAL | Status: DC | PRN

## 2013-07-13 MED ORDER — ROCURONIUM BROMIDE 50 MG/5ML IV SOLN
INTRAVENOUS | Status: AC
  Filled 2013-07-13: qty 1

## 2013-07-13 MED ORDER — MENTHOL 3 MG MT LOZG: 1.0000 | LOZENGE | OROMUCOSAL | Status: DC | PRN

## 2013-07-13 MED ORDER — FENTANYL CITRATE 0.05 MG/ML IJ SOLN
INTRAMUSCULAR | Status: DC | PRN
  Administered 2013-07-13 (×4): 50 ug via INTRAVENOUS
  Administered 2013-07-13: 150 ug via INTRAVENOUS
  Administered 2013-07-13 (×3): 50 ug via INTRAVENOUS

## 2013-07-13 MED ORDER — MIDAZOLAM HCL 2 MG/2ML IJ SOLN
INTRAMUSCULAR | Status: AC
  Filled 2013-07-13: qty 2

## 2013-07-13 MED ORDER — PROPOFOL 10 MG/ML IV BOLUS
INTRAVENOUS | Status: AC
  Filled 2013-07-13: qty 20

## 2013-07-13 MED ORDER — METHOCARBAMOL 500 MG PO TABS
ORAL_TABLET | ORAL | Status: AC
  Filled 2013-07-13: qty 1

## 2013-07-13 MED ORDER — POLYETHYLENE GLYCOL 3350 17 G PO PACK: 17.0000 g | PACK | Freq: Every day | ORAL | Status: DC | PRN

## 2013-07-13 MED ORDER — OXYCODONE-ACETAMINOPHEN 10-325 MG PO TABS: 1.0000 | ORAL_TABLET | Freq: Four times a day (QID) | ORAL | Status: DC | PRN

## 2013-07-13 MED ORDER — DIPHENHYDRAMINE HCL 12.5 MG/5ML PO ELIX: 12.5000 mg | ORAL_SOLUTION | ORAL | Status: DC | PRN

## 2013-07-13 MED ORDER — FENTANYL CITRATE 0.05 MG/ML IJ SOLN
INTRAMUSCULAR | Status: AC
  Filled 2013-07-13: qty 5

## 2013-07-13 MED ORDER — HYDROMORPHONE HCL PF 1 MG/ML IJ SOLN
1.0000 mg | INTRAMUSCULAR | Status: DC | PRN
  Administered 2013-07-13 (×2): 1 mg via INTRAVENOUS
  Filled 2013-07-13 (×2): qty 1

## 2013-07-13 MED ORDER — ENOXAPARIN SODIUM 30 MG/0.3ML ~~LOC~~ SOLN
30.0000 mg | Freq: Two times a day (BID) | SUBCUTANEOUS | Status: DC
  Filled 2013-07-13 (×3): qty 0.3

## 2013-07-13 MED ORDER — DEXAMETHASONE SODIUM PHOSPHATE 10 MG/ML IJ SOLN
10.0000 mg | Freq: Three times a day (TID) | INTRAMUSCULAR | Status: AC
  Administered 2013-07-13: 10 mg via INTRAVENOUS
  Filled 2013-07-13 (×3): qty 1

## 2013-07-13 MED ORDER — PROMETHAZINE HCL 25 MG/ML IJ SOLN: 6.2500 mg | INTRAMUSCULAR | Status: DC | PRN

## 2013-07-13 MED ORDER — ONDANSETRON HCL 4 MG/2ML IJ SOLN
4.0000 mg | Freq: Four times a day (QID) | INTRAMUSCULAR | Status: DC | PRN
  Administered 2013-07-13: 4 mg via INTRAVENOUS
  Filled 2013-07-13 (×2): qty 2

## 2013-07-13 MED ORDER — LIDOCAINE HCL (CARDIAC) 20 MG/ML IV SOLN
INTRAVENOUS | Status: AC
  Filled 2013-07-13: qty 5

## 2013-07-13 MED ORDER — METOCLOPRAMIDE HCL 5 MG/ML IJ SOLN
5.0000 mg | Freq: Three times a day (TID) | INTRAMUSCULAR | Status: DC | PRN
  Administered 2013-07-13: 10 mg via INTRAVENOUS
  Filled 2013-07-13: qty 2

## 2013-07-13 MED ORDER — VANCOMYCIN HCL IN DEXTROSE 1-5 GM/200ML-% IV SOLN
1000.0000 mg | Freq: Two times a day (BID) | INTRAVENOUS | Status: AC
  Administered 2013-07-13: 1000 mg via INTRAVENOUS
  Filled 2013-07-13: qty 200

## 2013-07-13 MED ORDER — 0.9 % SODIUM CHLORIDE (POUR BTL) OPTIME
TOPICAL | Status: DC | PRN
  Administered 2013-07-13: 1000 mL

## 2013-07-13 MED ORDER — METHOCARBAMOL 500 MG PO TABS
500.0000 mg | ORAL_TABLET | Freq: Four times a day (QID) | ORAL | Status: DC | PRN
  Administered 2013-07-13 (×2): 500 mg via ORAL
  Filled 2013-07-13 (×3): qty 1

## 2013-07-13 SURGICAL SUPPLY — 63 items
APL SKNCLS STERI-STRIP NONHPOA (GAUZE/BANDAGES/DRESSINGS) ×1
BANDAGE ELASTIC 6 VELCRO ST LF (GAUZE/BANDAGES/DRESSINGS) ×3 IMPLANT
BANDAGE ESMARK 6X9 LF (GAUZE/BANDAGES/DRESSINGS) ×1 IMPLANT
BENZOIN TINCTURE PRP APPL 2/3 (GAUZE/BANDAGES/DRESSINGS) ×2 IMPLANT
BLADE 10 SAFETY STRL DISP (BLADE) ×2 IMPLANT
BLADE SAG 18X100X1.27 (BLADE) ×2 IMPLANT
BLADE SAW RECIP 87.9 MT (BLADE) ×2 IMPLANT
BLADE SAW SGTL 13X75X1.27 (BLADE) ×2 IMPLANT
BNDG CMPR 9X6 STRL LF SNTH (GAUZE/BANDAGES/DRESSINGS) ×1
BNDG ESMARK 6X9 LF (GAUZE/BANDAGES/DRESSINGS) ×2
BOOTCOVER CLEANROOM LRG (PROTECTIVE WEAR) ×4 IMPLANT
BOWL SMART MIX CTS (DISPOSABLE) ×2 IMPLANT
CAPT FB KNEE W/COCR XLINK ×1 IMPLANT
CEMENT HV SMART SET (Cement) ×4 IMPLANT
CLSR STERI-STRIP ANTIMIC 1/2X4 (GAUZE/BANDAGES/DRESSINGS) ×2 IMPLANT
COVER SURGICAL LIGHT HANDLE (MISCELLANEOUS) ×2 IMPLANT
CUFF TOURNIQUET SINGLE 34IN LL (TOURNIQUET CUFF) IMPLANT
DRAPE EXTREMITY T 121X128X90 (DRAPE) ×2 IMPLANT
DRAPE PROXIMA HALF (DRAPES) ×4 IMPLANT
DRAPE U-SHAPE 47X51 STRL (DRAPES) ×2 IMPLANT
DRSG PAD ABDOMINAL 8X10 ST (GAUZE/BANDAGES/DRESSINGS) ×1 IMPLANT
DURAPREP 26ML APPLICATOR (WOUND CARE) ×2 IMPLANT
ELECT CAUTERY BLADE 6.4 (BLADE) ×2 IMPLANT
ELECT REM PT RETURN 9FT ADLT (ELECTROSURGICAL) ×2
ELECTRODE REM PT RTRN 9FT ADLT (ELECTROSURGICAL) ×1 IMPLANT
FACESHIELD WRAPAROUND (MASK) ×2 IMPLANT
FACESHIELD WRAPAROUND OR TEAM (MASK) ×1 IMPLANT
GLOVE BIOGEL PI ORTHO PRO SZ8 (GLOVE) ×2
GLOVE ORTHO TXT STRL SZ7.5 (GLOVE) ×2 IMPLANT
GLOVE PI ORTHO PRO STRL SZ8 (GLOVE) ×2 IMPLANT
GLOVE SURG ORTHO 8.0 STRL STRW (GLOVE) ×2 IMPLANT
GOWN STRL REUS W/ TWL XL LVL3 (GOWN DISPOSABLE) ×1 IMPLANT
GOWN STRL REUS W/TWL 2XL LVL3 (GOWN DISPOSABLE) ×2 IMPLANT
GOWN STRL REUS W/TWL XL LVL3 (GOWN DISPOSABLE) ×2
HANDPIECE INTERPULSE COAX TIP (DISPOSABLE) ×2
HOOD PEEL AWAY FACE SHEILD DIS (HOOD) ×4 IMPLANT
IMMOBILIZER KNEE 22 (SOFTGOODS) ×2 IMPLANT
KIT BASIN OR (CUSTOM PROCEDURE TRAY) ×2 IMPLANT
KIT ROOM TURNOVER OR (KITS) ×2 IMPLANT
MANIFOLD NEPTUNE II (INSTRUMENTS) ×2 IMPLANT
NS IRRIG 1000ML POUR BTL (IV SOLUTION) ×2 IMPLANT
PACK TOTAL JOINT (CUSTOM PROCEDURE TRAY) ×2 IMPLANT
PAD ABD 8X10 STRL (GAUZE/BANDAGES/DRESSINGS) ×2 IMPLANT
PAD ARMBOARD 7.5X6 YLW CONV (MISCELLANEOUS) ×4 IMPLANT
PAD CAST 4YDX4 CTTN HI CHSV (CAST SUPPLIES) ×1 IMPLANT
PADDING CAST COTTON 4X4 STRL (CAST SUPPLIES) ×2
PADDING CAST COTTON 6X4 STRL (CAST SUPPLIES) ×2 IMPLANT
SET HNDPC FAN SPRY TIP SCT (DISPOSABLE) ×1 IMPLANT
SPONGE GAUZE 4X4 12PLY (GAUZE/BANDAGES/DRESSINGS) ×2 IMPLANT
STAPLER VISISTAT 35W (STAPLE) ×2 IMPLANT
STRIP CLOSURE SKIN 1/2X4 (GAUZE/BANDAGES/DRESSINGS) ×1 IMPLANT
SUCTION FRAZIER TIP 10 FR DISP (SUCTIONS) ×2 IMPLANT
SUT MNCRL AB 4-0 PS2 18 (SUTURE) IMPLANT
SUT VIC AB 0 CT1 27 (SUTURE) ×2
SUT VIC AB 0 CT1 27XBRD ANBCTR (SUTURE) ×1 IMPLANT
SUT VIC AB 2-0 CT1 27 (SUTURE) ×2
SUT VIC AB 2-0 CT1 TAPERPNT 27 (SUTURE) ×1 IMPLANT
SUT VIC AB 3-0 SH 8-18 (SUTURE) ×4 IMPLANT
SYR 30ML LL (SYRINGE) ×2 IMPLANT
TOWEL OR 17X24 6PK STRL BLUE (TOWEL DISPOSABLE) ×2 IMPLANT
TOWEL OR 17X26 10 PK STRL BLUE (TOWEL DISPOSABLE) ×2 IMPLANT
TRAY FOLEY CATH 16FRSI W/METER (SET/KITS/TRAYS/PACK) IMPLANT
WATER STERILE IRR 1000ML POUR (IV SOLUTION) ×4 IMPLANT

## 2013-07-13 NOTE — H&P (Signed)
PREOPERATIVE H&P  Chief Complaint: DJD RIGHT KNEE  HPI: [name] is a 53 y.o. female who presents for preoperative history and physical with a diagnosis of DJD RIGHT KNEE. Symptoms are rated as moderate to severe, and have been worsening.  This is significantly impairing activities of daily living.  She has elected for surgical management. Had left TKA, very happy with that.  Failed nsaids, injections, attempted wt loss.  Past Medical History  Diagnosis Date  . Morbid obesity   . Degenerative joint disease     Left TKA in 07/2009-Dr. Bettie Swavely  . Chronic back pain   . Syncope     with palpitations; evaluated by [facility] in 2008 with normal echo  . Headache(784.0)     occasionally  . Joint pain   . Joint swelling   . Peripheral edema     to right leg;takes Furosemide occasionally and hasnt had one in a month  . Nocturia   . Hypothyroidism     was on Levothyroxine-has been off x 4 months  . Depression 2001    doesn't take any meds   Past Surgical History  Procedure Laterality Date  . Abdominal hysterectomy  1995     no oophorectomy  . Cholecystectomy, laparoscopic  2007    Dr. Lovell Sheehan  . Thyroidectomy, partial  2004    Left lobectomy - large benign nodule  . Knee arthroscopy  1995    Dr. Cleophas Dunker   . Total knee arthroplasty  07/2009    Left knee , Dr. Dion Saucier   . Carpal tunnel release  1991    Bilateral  . Craniectomy suboccipital w/ cervical laminectomy / chiari  2007    Repair of Arnold-Chiari malformation  . Tonsillectomy    . Cesarean section      X2  . Cholecystectomy    . Joint replacement Left 2011  . Colonoscopy N/A 03/03/2013    Procedure: COLONOSCOPY;  Surgeon: Malissa Hippo, MD;  Location: [facility] SUITE;  Service: Endoscopy;  Laterality: N/A;  930   History   Social History  . Marital Status: Divorced    Spouse Name: N/A    Number of Children: 3  . Years of Education: N/A   Occupational History  . Employed seeking  disabilty 2012    Social History Main Topics  . Smoking status: Never Smoker   . Smokeless tobacco: None  . Alcohol Use: No  . Drug Use: No  . Sexual Activity: Yes    Birth Control/ Protection: Surgical   Other Topics Concern  . None   Social History Narrative  . None   Family History  Problem Relation Age of Onset  . Diabetes Mother   . Diabetes Father     emphsema   . Hyperlipidemia Father   . Colon cancer Neg Hx    Allergies  Allergen Reactions  . Aleve [Naproxen Sodium] Shortness Of Breath  . Penicillins Swelling    Facial Area   . Adhesive [Tape]     rash  . Latex Rash   Prior to Admission medications   Medication Sig Start Date End Date Taking? Authorizing Provider  gabapentin (NEURONTIN) 300 MG capsule Take 1 capsule (300 mg total) by mouth 2 (two) times daily. 07/08/13  Yes Kerri Perches, MD  HYDROcodone-acetaminophen (NORCO/VICODIN) 5-325 MG per tablet One tablet at bedtime, as needed, for uncontrolled back pain. Seven days only 07/08/13  Yes Kerri Perches, MD  triamterene-hydrochlorothiazide (MAXZIDE-25) 37.5-25 MG per  tablet Take 1 tablet by mouth daily. 07/08/13  Yes Kerri Perches, MD     Positive ROS: All other systems have been reviewed and were otherwise negative with the exception of those mentioned in the HPI and as above.  Physical Exam: General: Alert, no acute distress Cardiovascular: No pedal edema Respiratory: No cyanosis, no use of accessory musculature GI: No organomegaly, abdomen is soft and non-tender Skin: No lesions in the area of chief complaint Neurologic: Sensation intact distally Psychiatric: Patient is competent for consent with normal mood and affect Lymphatic: No axillary or cervical lymphadenopathy  MUSCULOSKELETAL: right knee with crepitance.  5-90 degrees.    Assessment: DJD RIGHT KNEE with morbid obesity  Plan: Plan for Procedure(s): TOTAL KNEE ARTHROPLASTY  The risks benefits and alternatives were  discussed with the patient including but not limited to the risks of nonoperative treatment, versus surgical intervention including infection, bleeding, nerve injury,  blood clots, cardiopulmonary complications, morbidity, mortality, among others, and they were willing to proceed.   Eulas Post, MD Cell [phone]   07/13/2013 7:22 AM

## 2013-07-13 NOTE — Progress Notes (Signed)
Orthopedic Tech Progress Note Patient Details:  [name] 1961/03/04 [id number]  CPM Right Knee CPM Right Knee: On Right Knee Flexion (Degrees): 60 Right Knee Extension (Degrees): 0 Additional Comments: Trapeze bar   Mickie Bail Cammer 07/13/2013, 12:30 PM

## 2013-07-13 NOTE — Transfer of Care (Signed)
Immediate Anesthesia Transfer of Care Note  Patient: [name]  Procedure(s) Performed: Procedure(s): TOTAL KNEE ARTHROPLASTY (Right)  Patient Location: PACU  Anesthesia Type:General  Level of Consciousness: sedated  Airway & Oxygen Therapy: Patient Spontanous Breathing and Patient connected to nasal cannula oxygen  Post-op Assessment: Report given to PACU RN and Post -op Vital signs reviewed and stable  Post vital signs: stable  Complications: No apparent anesthesia complications

## 2013-07-13 NOTE — Care Management Note (Addendum)
CARE MANAGEMENT NOTE 07/13/2013  Patient:  [name], [name]   Account Number:  [id number]  Date Initiated:  07/13/2013  Documentation initiated by:  Vance Peper  Subjective/Objective Assessment:   53 yr old female s/p right total knee arthroplasty.     Action/Plan:   PT/OT eval   Anticipated DC Date:        Anticipated DC Plan:  Home with Home Health         Choice offered to / List presented to:             Status of service:  In process, will continue to follow Medicare Important Message given?

## 2013-07-13 NOTE — Anesthesia Postprocedure Evaluation (Signed)
Anesthesia Post Note  Patient: [name]  Procedure(s) Performed: Procedure(s) (LRB): TOTAL KNEE ARTHROPLASTY (Right)  Anesthesia type: General  Patient location: PACU  Post pain: Pain level controlled  Post assessment: Patient's Cardiovascular Status Stable  Last Vitals:  Filed Vitals:   07/13/13 1218  BP:   Pulse:   Temp: 36.6 C  Resp:     Post vital signs: Reviewed and stable  Level of consciousness: alert  Complications: No apparent anesthesia complications

## 2013-07-13 NOTE — Anesthesia Procedure Notes (Signed)
Anesthesia Regional Block:  Femoral nerve block  Pre-Anesthetic Checklist: ,, timeout performed, Correct Patient, Correct Site, Correct Laterality, Correct Procedure, Correct Position, site marked, Risks and benefits discussed,  Surgical consent,  Pre-op evaluation,  At surgeon's request and post-op pain management  Laterality: Right  Prep: chloraprep       Needles:  Injection technique: Single-shot  Needle Type: Echogenic Stimulator Needle     Needle Length: 5cm 5 cm Needle Gauge: 22 and 22 G    Additional Needles:  Procedures: ultrasound guided (picture in chart) and nerve stimulator Femoral nerve block  Nerve Stimulator or Paresthesia:  Response: quadraceps contraction, 0.45 mA,   Additional Responses:   Narrative:  Start time: 07/13/2013 7:11 AM End time: 07/13/2013 7:21 AM Injection made incrementally with aspirations every 5 mL.  Performed by: Personally  Anesthesiologist: Halford Decamp, MD  Additional Notes: Functioning IV was confirmed and monitors were applied.  A 5mm 22ga Arrow echogenic stimulator needle was used. Sterile prep and drape,hand hygiene and sterile gloves were used. Ultrasound guidance: relevant anatomy identified, needle position confirmed, local anesthetic spread visualized around nerve(s)., vascular puncture avoided.  Image printed for medical record. Negative aspiration and negative test dose prior to incremental administration of local anesthetic. The patient tolerated the procedure well.

## 2013-07-13 NOTE — Discharge Instructions (Signed)
Diet: As you were doing prior to hospitalization  ° °Shower:  May shower but keep the wounds dry, use an occlusive plastic wrap, NO SOAKING IN TUB.  If the bandage gets wet, change with a clean dry gauze. ° °Dressing:  You may change your dressing 3-5 days after surgery.  Then change the dressing daily with sterile gauze dressing.   ° °There are sticky tapes (steri-strips) on your wounds and all the stitches are absorbable.  Leave the steri-strips in place when changing your dressings, they will peel off with time, usually 2-3 weeks. ° °Activity:  Increase activity slowly as tolerated, but follow the weight bearing instructions below.  No lifting or driving for 6 weeks. ° °Weight Bearing:   As tolerated.   ° °To prevent constipation: you may use a stool softener such as - ° °Colace (over the counter) 100 mg by mouth twice a day  °Drink plenty of fluids (prune juice may be helpful) and high fiber foods °Miralax (over the counter) for constipation as needed.   ° °Itching:  If you experience itching with your medications, try taking only a single pain pill, or even half a pain pill at a time.  You may take up to 10 pain pills per day, and you can also use benadryl over the counter for itching or also to help with sleep.  ° °Precautions:  If you experience chest pain or shortness of breath - call 911 immediately for transfer to the hospital emergency department!! ° °If you develop a fever greater that 101 F, purulent drainage from wound, increased redness or drainage from wound, or calf pain -- Call the office at [phone]                                                °Follow- Up Appointment:  Please call for an appointment to be seen in 2 weeks [place] - [phone] ° ° ° ° ° °

## 2013-07-13 NOTE — Op Note (Signed)
DATE OF SURGERY:  07/13/2013 TIME: 10:02 AM  PATIENT NAME:  [name]   AGE: 53 y.o.    PRE-OPERATIVE DIAGNOSIS:  DJD RIGHT KNEE  POST-OPERATIVE DIAGNOSIS:  Same  PROCEDURE:  Procedure(s): TOTAL KNEE ARTHROPLASTY   SURGEON:  Eulas Post, MD   ASSISTANT:  Janace Litten, OPA-C, present and scrubbed throughout the case, critical for assistance with exposure, retraction, instrumentation, and closure.   OPERATIVE IMPLANTS: Depuy PFC Sigma, Posterior Stabilized.  Femur size 3, Tibia size 3, Patella size 38 3-peg oval button, with a 10 mm polyethylene insert.  A 22 modifier was applied to the procedure because of morbid obesity, requiring a second surgical assistant, extended tourniquet time, and increased risk and complexity of the operation, with a BMI of 68.  PREOPERATIVE INDICATIONS:  [name] is a 53 y.o. year old female with end stage bone on bone degenerative arthritis of the knee who failed conservative treatment, including injections, antiinflammatories, activity modification, and assistive devices, and had significant impairment of their activities of daily living, and elected for Total Knee Arthroplasty.   The risks, benefits, and alternatives were discussed at length including but not limited to the risks of infection, bleeding, nerve injury, stiffness, blood clots, the need for revision surgery, cardiopulmonary complications, among others, and they were willing to proceed. She has known morbid obesity, and unfortunately has gained weight since her last knee replacement, when she had a BMI of 62. She has substantial operative risks, which have been discussed at length with her, and she was eager to proceed, and in fact indicated to me to do her right knee the exact same way that I did her left knee, because she has had no problem and no pain with that.   OPERATIVE DESCRIPTION:  The patient was brought to the operative room and placed in a supine  position.  General anesthesia was administered.  IV antibiotics were given.  The lower extremity was prepped and draped in the usual sterile fashion.  Time out was performed.  The leg was elevated and exsanguinated and the tourniquet was inflated.  Anterior quadriceps tendon splitting approach was performed.  The patella was everted and osteophytes were removed.  The anterior horn of the medial and lateral meniscus was removed.   The distal femur was opened with the drill and the intramedullary distal femoral cutting jig was utilized, set at 5 degrees resecting 10 mm off the distal femur.  Care was taken to protect the collateral ligaments.  Then the extramedullary tibial cutting jig was utilized making the appropriate cut using the anterior tibial crest as a reference building in appropriate posterior slope.  Care was taken during the cut to protect the medial and collateral ligaments.  The proximal tibia was removed along with the posterior horns of the menisci.  The PCL was sacrificed.  Her previous knee was under resected on the lateral side, leading to some degree of malalignment, although clinically this has not been relevant, and has not demonstrated any evidence for early wear patterns, nonetheless I resected the tibia using standard technique, shifting the alignment jig two clicks medial in order to try to resect adequate lateral tibia, however my resection was not a substantial as I had predicted. I suspected similar to the contralateral side, however clinically she has done so well on the contralateral side that I accepted this tibial resection. Additionally, in comparison to the other side, I only had to cut the tibia once, and I was able to  have an extension gap that measured nicely at 10 mm.  The distal femoral sizing jig was applied, taking care to avoid notching.  Then the 4-in-1 cutting jig was applied and the anterior and posterior femur was cut, along with the chamfer cuts.  All posterior  osteophytes were removed.  The flexion gap was then measured and was symmetric with the extension gap.  I completed the distal femoral preparation using the appropriate jig to prepare the box.  The patella was then measured, and cut with the saw.  The coverage was better with a 38, although I used a 35 on the other side, the 38 seemed to fit more appropriately, and so this was selected.  The proximal tibia sized and prepared accordingly with the reamer and the punch, and then all components were trialed with the 28mm poly insert.  The knee was found to have excellent balance and full motion.    The above named components were then cemented into place and all excess cement was removed.  The real polyethylene implant was placed.  The knee was easily taken through a range of motion and the patella tracked well and the knee irrigated copiously and the parapatellar and subcutaneous tissue closed with vicryl, and monocryl with steri strips for the skin.  The wounds were injected with marcaine, and dressed with sterile gauze and the tourniquet released and the patient was awakened and returned to the PACU in stable and satisfactory condition.  There were no complications.  Total tourniquet time was 120 minutes.

## 2013-07-13 NOTE — Evaluation (Signed)
Physical Therapy Evaluation Patient Details Name: [name] MRN: [id number] DOB: [birth year] Today's Date: 07/13/2013   History of Present Illness  Patient is a 53 y/o female admitted for right TKA.  Clinical Impression  Patient presents with decreased independence with mobility due to deficits listed in PT problem list.  Patient will benefit from skilled PT in the acute setting to allow return home with family assist and HHPT.  May need STSNF depending on progress over next day.  Limited mobility due to right LE still numb and N&V.      Follow Up Recommendations Home health PT;Supervision/Assistance - 24 hour    Equipment Recommendations  None recommended by PT    Recommendations for Other Services       Precautions / Restrictions Precautions Precautions: Fall;Knee Required Braces or Orthoses: Knee Immobilizer - Right Restrictions Weight Bearing Restrictions: Yes RLE Weight Bearing: Weight bearing as tolerated      Mobility  Bed Mobility Overal bed mobility: Needs Assistance Bed Mobility: Supine to Sit;Sit to Supine     Supine to sit: Min assist;HOB elevated Sit to supine: Mod assist;+2 for safety/equipment   General bed mobility comments: assisted up helping right LE only, then back to bed +2 for safety pt weak after N&V  Transfers Overall transfer level: Needs assistance Equipment used: Rolling walker (2 wheeled) Transfers: Sit to/from Stand Sit to Stand: Mod assist         General transfer comment: increased time up from edge of bed with effort cues to use left LE due to right LE numb still from surgery  Ambulation/Gait Ambulation/Gait assistance:  (NT due to right LE weakness and N&V)              Stairs            Wheelchair Mobility    Modified Rankin (Stroke Patients Only)       Balance Overall balance assessment: Needs assistance         Standing balance support: Bilateral upper extremity supported;Single extremity  supported Standing balance-Leahy Scale: Poor Standing balance comment: stood at side of bed x about 3 minutes one hand support on walker and min guard assist while experiencing N&V. assisted back to bed with RN assist                              Pertinent Vitals/Pain Right LE pain not rated    Home Living Family/patient expects to be discharged to:: Private residence Living Arrangements: Children Available Help at Discharge: Family Type of Home: House Home Access: Stairs to enter Entrance Stairs-Rails: Right Entrance Stairs-Number of Steps: 2 Home Layout: One level Home Equipment: Environmental consultant - 2 wheels;Cane - single point      Prior Function Level of Independence: Independent with assistive device(s)               Hand Dominance        Extremity/Trunk Assessment               Lower Extremity Assessment: RLE deficits/detail RLE Deficits / Details: ankle AROM WFL, knee still slightly numb and without extensor control, able to lift from hip with min assist       Communication   Communication: No difficulties  Cognition Arousal/Alertness: Awake/alert Behavior During Therapy: WFL for tasks assessed/performed Overall Cognitive Status: Within Functional Limits for tasks assessed  General Comments      Exercises Total Joint Exercises Ankle Circles/Pumps: AROM;Both;5 reps;Supine      Assessment/Plan    PT Assessment Patient needs continued PT services  PT Diagnosis Difficulty walking;Acute pain   PT Problem List Decreased strength;Decreased range of motion;Decreased mobility;Decreased activity tolerance;Decreased balance;Pain;Decreased knowledge of use of DME  PT Treatment Interventions DME instruction;Gait training;Stair training;Functional mobility training;Therapeutic activities;Patient/family education;Balance training;Therapeutic exercise   PT Goals (Current goals can be found in the Care Plan section) Acute Rehab  PT Goals Patient Stated Goal: To go home PT Goal Formulation: With patient Time For Goal Achievement: 07/20/13 Potential to Achieve Goals: Good    Frequency Min 6X/week   Barriers to discharge        Co-evaluation               End of Session Equipment Utilized During Treatment: Gait belt;Right knee immobilizer Activity Tolerance: Other (comment) (limited due to N&V) Patient left: in bed;with call bell/phone within reach;with family/visitor present           Time: 1533-1600 PT Time Calculation (min): 27 min   Charges:   PT Evaluation $Initial PT Evaluation Tier I: 1 Procedure PT Treatments $Therapeutic Activity: 8-22 mins   PT G CodesAne Payment 07/13/2013, 4:05 PM Sheran Lawless, PT [phone] 07/13/2013

## 2013-07-13 NOTE — Anesthesia Postprocedure Evaluation (Signed)
Anesthesia Post Note  Patient: [name]  Procedure(s) Performed: Procedure(s) (LRB): TOTAL KNEE ARTHROPLASTY (Right)  Anesthesia type: General  Patient location: PACU  Post pain: Pain level controlled  Post assessment: Patient's Cardiovascular Status Stable  Last Vitals:  Filed Vitals:   07/13/13 1218  BP:   Pulse:   Temp: 36.6 C  Resp:     Post vital signs: Reviewed and stable  Level of consciousness: alert  Complications: No apparent anesthesia complications  

## 2013-07-13 NOTE — Anesthesia Preprocedure Evaluation (Addendum)
Anesthesia Evaluation  Patient identified by MRN, date of birth, ID band Patient awake    Reviewed: Allergy & Precautions, H&P , NPO status , Patient's Chart, lab work & pertinent test results  Airway Mallampati: II TM Distance: >3 FB Neck ROM: full    Dental  (+) Teeth Intact, Missing, Dental Advidsory Given   Pulmonary neg pulmonary ROS,    Pulmonary exam normal       Cardiovascular hypertension, Pt. on medications     Neuro/Psych  Headaches, PSYCHIATRIC DISORDERS Depression    GI/Hepatic Neg liver ROS, GERD-  ,  Endo/Other  Hypothyroidism Morbid obesity  Renal/GU negative Renal ROS     Musculoskeletal   Abdominal Normal abdominal exam  (+)   Peds  Hematology   Anesthesia Other Findings   Reproductive/Obstetrics                          Anesthesia Physical Anesthesia Plan  ASA: III  Anesthesia Plan: General   Post-op Pain Management: MAC Combined w/ Regional for Post-op pain   Induction: Intravenous  Airway Management Planned: LMA  Additional Equipment:   Intra-op Plan:   Post-operative Plan: Extubation in OR  Informed Consent: I have reviewed the patients History and Physical, chart, labs and discussed the procedure including the risks, benefits and alternatives for the proposed anesthesia with the patient or authorized representative who has indicated his/her understanding and acceptance.   Dental Advisory Given  Plan Discussed with: CRNA, Anesthesiologist and Surgeon  Anesthesia Plan Comments:        Anesthesia Quick Evaluation

## 2013-07-13 NOTE — Progress Notes (Signed)
Utilization review completed.  

## 2013-07-14 LAB — BASIC METABOLIC PANEL
BUN: 12 mg/dL (ref 6–23)
CO2: 25 meq/L (ref 19–32)
CREATININE: 0.64 mg/dL (ref 0.50–1.10)
Calcium: 9.4 mg/dL (ref 8.4–10.5)
Chloride: 98 mEq/L (ref 96–112)
GFR calc Af Amer: >90 mL/min (ref >90)
Glucose, Bld: 141 mg/dL — ABNORMAL HIGH (ref 70–99)
Potassium: 4.5 mEq/L (ref 3.7–5.3)
SODIUM: 137 meq/L (ref 137–147)

## 2013-07-14 LAB — CBC
HCT: 38.5 % (ref 36.0–46.0)
Hemoglobin: 12 g/dL (ref 12.0–15.0)
MCH: 23.6 pg — AB (ref 26.0–34.0)
MCHC: 31.2 g/dL (ref 30.0–36.0)
MCV: 75.6 fL — ABNORMAL LOW (ref 78.0–100.0)
PLATELETS: 383 10*3/uL (ref 150–400)
RBC: 5.09 MIL/uL (ref 3.87–5.11)
RDW: 15.4 % (ref 11.5–15.5)
WBC: 15.6 10*3/uL — ABNORMAL HIGH (ref 4.0–10.5)

## 2013-07-14 MED ORDER — ENOXAPARIN SODIUM 30 MG/0.3ML ~~LOC~~ SOLN: 85.0000 mg | SUBCUTANEOUS | Status: DC

## 2013-07-14 MED ORDER — ENOXAPARIN SODIUM 100 MG/ML ~~LOC~~ SOLN
85.0000 mg | SUBCUTANEOUS | Status: DC
  Administered 2013-07-14 – 2013-07-15 (×2): 85 mg via SUBCUTANEOUS
  Filled 2013-07-14 (×3): qty 1

## 2013-07-14 NOTE — Progress Notes (Signed)
Physical Therapy Treatment Patient Details Name: [name] MRN: [id number] DOB: Aug 07, 1960 Today's Date: 07/14/2013    History of Present Illness Patient is a 53 y/o female admitted for right TKA.    PT Comments    Patient improved from yesterday with mobility and no nausea today.  Was able to ambulate today.  Still feel that home is appropriate.  Follow Up Recommendations  Home health PT;Supervision/Assistance - 24 hour     Equipment Recommendations  None recommended by PT    Recommendations for Other Services       Precautions / Restrictions Precautions Precautions: Fall;Knee Precaution Booklet Issued: Yes (comment) Required Braces or Orthoses: Knee Immobilizer - Right Knee Immobilizer - Right: Discontinue once straight leg raise with < 10 degree lag Restrictions Weight Bearing Restrictions: Yes RLE Weight Bearing: Weight bearing as tolerated    Mobility  Bed Mobility Overal bed mobility: Needs Assistance Bed Mobility: Supine to Sit     Supine to sit: Min assist     General bed mobility comments: min assist up with right LE only  Transfers Overall transfer level: Needs assistance Equipment used: Rolling walker (2 wheeled) Transfers: Sit to/from Stand Sit to Stand: Min assist         General transfer comment: min cueing to use UE for support to stand and sit  Ambulation/Gait Ambulation/Gait assistance: Min assist Ambulation Distance (Feet): 75 Feet Assistive device: Rolling walker (2 wheeled) Gait Pattern/deviations: Step-to pattern;Decreased step length - right;Decreased stance time - right;Decreased weight shift to right     General Gait Details: 2nd person needed for close chair follow, cueing for upright posture and to maintain positioning within walker   Stairs            Wheelchair Mobility    Modified Rankin (Stroke Patients Only)       Balance Overall balance assessment: Needs assistance         Standing balance  support: Bilateral upper extremity supported Standing balance-Leahy Scale: Poor Standing balance comment: stood at side of bed x about 1 minute with Bilat UE support on walker                    Cognition Arousal/Alertness: Awake/alert Behavior During Therapy: WFL for tasks assessed/performed Overall Cognitive Status: Within Functional Limits for tasks assessed                      Exercises Total Joint Exercises Ankle Circles/Pumps: AROM;Seated;Both;10 reps Quad Sets: AROM;Right;5 reps;Seated Long Arc Quad: AAROM;Right;15 reps;Seated Knee Flexion: AROM;Right;10 reps;Seated Goniometric ROM: 10-65    General Comments        Pertinent Vitals/Pain Pain with ROM activities, patient was premedicated    Home Living                      Prior Function            PT Goals (current goals can now be found in the care plan section) Acute Rehab PT Goals Patient Stated Goal: To go home Time For Goal Achievement: 07/20/13 Potential to Achieve Goals: Good Progress towards PT goals: Progressing toward goals    Frequency  7X/week    PT Plan Frequency needs to be updated    Co-evaluation             End of Session Equipment Utilized During Treatment: Right knee immobilizer;Gait belt Activity Tolerance: Patient tolerated treatment well Patient left: in chair;with call bell/phone within reach  Time: 7948-0165 PT Time Calculation (min): 31 min  Charges:  $Gait Training: 8-22 mins $Therapeutic Exercise: 8-22 mins                    G Codes:      Altus Zaino, SPT [phone] 07/14/2013, 11:19 AM

## 2013-07-14 NOTE — Evaluation (Signed)
Occupational Therapy Evaluation Patient Details Name: TIAJUANA LEPPANEN MRN: [id number] DOB: 1960/08/05 Today's Date: 07/14/2013    History of Present Illness Patient is a 53 y/o female admitted for right TKA.   Clinical Impression   Pt presents with below problem list. Pt independent with ADLs, PTA. Feel pt will benefit from acute OT to increase independence prior to d/c.     Follow Up Recommendations  No OT follow up;Supervision - Intermittent    Equipment Recommendations  None recommended by OT (pt planning to get tub/bench on her own)    Recommendations for Other Services       Precautions / Restrictions Precautions Precautions: Fall;Knee Precaution Booklet Issued: Yes (comment) Precaution Comments: Reviewed precautions with pt Required Braces or Orthoses: Knee Immobilizer - Right Knee Immobilizer - Right: Discontinue once straight leg raise with < 10 degree lag Restrictions Weight Bearing Restrictions: Yes RLE Weight Bearing: Weight bearing as tolerated Other Position/Activity Restrictions: '      Mobility  Transfers Overall transfer level: Needs assistance Equipment used: Rolling walker (2 wheeled) Transfers: Sit to/from Stand Sit to Stand: Min guard         General transfer comment: cues for technique.         ADL Overall ADL's : Needs assistance/impaired     Grooming: Wash/dry hands;Supervision/safety;Set up;Standing           Upper Body Dressing : Set up;Sitting   Lower Body Dressing: Min guard;Sit to/from stand   Toilet Transfer: Min guard;Ambulation;Comfort height toilet;Grab bars;RW   Toileting- Architect and Hygiene: Min guard;Sit to/from stand       Functional mobility during ADLs: Min guard;Rolling walker General ADL Comments: Educated on use of bag on walker and use of reacher. Educated on tub transfer techniques and recommended to not step over tub right now. Educated on dressing technique. Educated on safety tips  (safe shoe wear, rugs picked up, sitting for dressing and standing in front of chair/bed with walker in front when pulling up LB clothing).      Vision                     Perception     Praxis      Pertinent Vitals/Pain Pain 5/10. Repositioned at end of session.     Hand Dominance     Extremity/Trunk Assessment Upper Extremity Assessment Upper Extremity Assessment: Overall WFL for tasks assessed   Lower Extremity Assessment Lower Extremity Assessment: Defer to PT evaluation       Communication Communication Communication: No difficulties   Cognition Arousal/Alertness: Awake/alert Behavior During Therapy: WFL for tasks assessed/performed Overall Cognitive Status: Within Functional Limits for tasks assessed                     General Comments          Shoulder Instructions      Home Living Family/patient expects to be discharged to:: Private residence Living Arrangements: Children Available Help at Discharge: Family Type of Home: House Home Access: Stairs to enter Secretary/administrator of Steps: 2 Entrance Stairs-Rails: Right Home Layout: One level     Bathroom Shower/Tub: Chief Strategy Officer:  (has both)     Home Equipment: Environmental consultant - 2 wheels;Cane - single point;Bedside commode;Toilet riser          Prior Functioning/Environment Level of Independence: Independent with assistive device(s)             OT Diagnosis: Acute pain  OT Problem List: Decreased strength;Decreased activity tolerance;Pain;Decreased knowledge of precautions;Decreased knowledge of use of DME or AE   OT Treatment/Interventions: Self-care/ADL training;DME and/or AE instruction;Therapeutic activities;Patient/family education;Balance training    OT Goals(Current goals can be found in the care plan section) Acute Rehab OT Goals Patient Stated Goal: not stated OT Goal Formulation: With patient Time For Goal Achievement: 07/21/13 Potential to  Achieve Goals: Good ADL Goals Pt Will Perform Lower Body Dressing: with modified independence;sit to/from stand Pt Will Transfer to Toilet: with modified independence;ambulating (3 in 1 over commode) Pt Will Perform Toileting - Clothing Manipulation and hygiene: with modified independence;sit to/from stand Pt Will Perform Tub/Shower Transfer: Tub transfer;ambulating;tub bench;rolling walker  OT Frequency: Min 2X/week   Barriers to D/C:            Co-evaluation              End of Session Equipment Utilized During Treatment: Gait belt;Rolling walker;Right knee immobilizer CPM Right Knee CPM Right Knee: Off Nurse Communication: Other (comment) (pt urinated)  Activity Tolerance: Patient tolerated treatment well Patient left: in chair;with call bell/phone within reach   Time: 1055-1120 OT Time Calculation (min): 25 min Charges:  OT General Charges $OT Visit: 1 Procedure OT Evaluation $Initial OT Evaluation Tier I: 1 Procedure OT Treatments $Self Care/Home Management : 8-22 mins G-Codes:    Earlie Raveling OTR/L [phone] 07/14/2013, 11:49 AM

## 2013-07-14 NOTE — Progress Notes (Signed)
     Subjective:  Patient reports pain as mild.  Doing well.  Eating breakfast.  Objective:   VITALS:   Filed Vitals:   07/14/13 0000 07/14/13 0137 07/14/13 0400 07/14/13 0511  BP:  152/59  138/63  Pulse:  66  64  Temp:  99.1 F (37.3 C)  97.9 F (36.6 C)  TempSrc:      Resp: 18 18 16 16   Height:      Weight:      SpO2:  97%  98%    Neurologically intact Dorsiflexion/Plantar flexion intact Incision: dressing C/D/I and no drainage   Lab Results  Component Value Date   WBC 15.6* 07/14/2013   HGB 12.0 07/14/2013   HCT 38.5 07/14/2013   MCV 75.6* 07/14/2013   PLT 383 07/14/2013     Assessment/Plan: 1 Day Post-Op   Principal Problem:   Osteoarthritis of right knee Active Problems:   Morbid obesity   Knee osteoarthritis   Advance diet Up with therapy Plan for discharge tomorrow Discharge home with home health Dc foley.  Adjust lovenox for weight per pharmacy.   07/16/2013 07/14/2013, 7:30 AM   07/16/2013, MD Cell [phone]

## 2013-07-14 NOTE — Progress Notes (Signed)
Orthopedic Tech Progress Note Patient Details:  [name] February 15, 1961 [id number]  Patient ID: [name], female   DOB: 1960-12-02, 53 y.o.   MRN: [id number] Placed pt's rle in cpm @ 0-65 degrees @ 1525  Miesha Bachmann 07/14/2013, 3:29 PM

## 2013-07-14 NOTE — Care Management Note (Signed)
CARE MANAGEMENT NOTE 07/14/2013  Patient:  [name], [name]   Account Number:  [id number]  Date Initiated:  07/13/2013  Documentation initiated by:  Vance Peper  Subjective/Objective Assessment:   53 yr old female s/p right total knee arthroplasty.     Action/Plan:   PT/OT eval   Anticipated DC Date:  07/15/2013   Anticipated DC Plan:  HOME W HOME HEALTH SERVICES      DC Planning Services  CM consult      PAC Choice  DURABLE MEDICAL EQUIPMENT  HOME HEALTH   Choice offered to / List presented to:  C-1 Patient   DME arranged  CPM      DME agency  [employer]     HH arranged  HH-2 PT      HH agency  [facility]   Status of service:  Completed, signed off Medicare Important Message given?  YES (If response is "NO", the following Medicare IM given date fields will be blank) Date Medicare IM given:  07/14/2013 Date Additional Medicare IM given:    Discharge Disposition:  HOME W HOME HEALTH SERVICES  Per UR Regulation:    If discussed at Long Length of Stay Meetings, dates discussed:    Comments:  07/14/13 Vance Peper, RN BSN Case Manager Case manager spoke with patient concerning home health and DME needs at discharge. Choice offered for home health. Referral called to [facility] Patient states she has 3in1 and rolling walker at home. Patient wanted a tub bench, case manager contacted Vaughan Basta, [facility] DME liasion for price quote. Bariatric tub seat will cost $148.00, transfer bench will cost $122.00, CM gave patient these prices and explained that payment would be expected when DME was brought up. Patient has declined ordering tub seat at this time.

## 2013-07-14 NOTE — Progress Notes (Signed)
Agree with SPT.    Vasiliy Mccarry, PT [phone]  

## 2013-07-14 NOTE — Plan of Care (Signed)
Problem: Consults Goal: Diagnosis- Total Joint Replacement Outcome: Completed/Met Date Met:  07/14/13 Primary Total Knee

## 2013-07-14 NOTE — Progress Notes (Signed)
Agree with SPT.    Ragan Reale, PT [phone]  

## 2013-07-14 NOTE — Progress Notes (Signed)
Physical Therapy Treatment Patient Details Name: [name] MRN: [id number] DOB: 1960-08-17 Today's Date: 07/14/2013    History of Present Illness Patient is a 53 y/o female admitted for right TKA.    PT Comments    Patient demonstrates improved standing balance with no hand hold x 10 seconds and increased gait distance without rest.  Follow Up Recommendations  Home health PT;Supervision/Assistance - 24 hour     Equipment Recommendations  None recommended by PT    Recommendations for Other Services       Precautions / Restrictions Precautions Precautions: Fall;Knee Precaution Comments: Reviewed precautions with pt Required Braces or Orthoses: Knee Immobilizer - Right Knee Immobilizer - Right: Discontinue once straight leg raise with < 10 degree lag Restrictions Weight Bearing Restrictions: Yes RLE Weight Bearing: Weight bearing as tolerated Other Position/Activity Restrictions: '    Mobility  Bed Mobility                  Transfers Overall transfer level: Needs assistance Equipment used: Rolling walker (2 wheeled) Transfers: Sit to/from Stand Sit to Stand: Min guard         General transfer comment: Cue to position walker before beginning to stand  Ambulation/Gait Ambulation/Gait assistance: Min assist Ambulation Distance (Feet): 230 Feet Assistive device: Rolling walker (2 wheeled) Gait Pattern/deviations: Step-to pattern;Decreased step length - right;Decreased stance time - right;Decreased weight shift to right     General Gait Details: 2nd person needed for chair follow, cueing for upright posture   Stairs            Wheelchair Mobility    Modified Rankin (Stroke Patients Only)       Balance Overall balance assessment: Needs assistance         Standing balance support: Bilateral upper extremity supported Standing balance-Leahy Scale: Fair Standing balance comment: Stood in hall with Bilat. hands off of walker x 10  seconds to adjust clothes, needed Bilat. UE support to shift weight in standing                    Cognition Arousal/Alertness: Awake/alert Behavior During Therapy: WFL for tasks assessed/performed Overall Cognitive Status: Within Functional Limits for tasks assessed                      Exercises Total Joint Exercises Long Arc Quad: AAROM;Right;10 reps;Seated Knee Flexion: AROM;Right;10 reps;Seated    General Comments        Pertinent Vitals/Pain 5/10 pain at rest, 6/10 while walking.  Nurse notified of pain scores.    Home Living Family/patient expects to be discharged to:: Private residence Living Arrangements: Children Available Help at Discharge: Family Type of Home: House Home Access: Stairs to enter Entrance Stairs-Rails: Right Home Layout: One level Home Equipment: Environmental consultant - 2 wheels;Cane - single point;Bedside commode;Toilet riser      Prior Function Level of Independence: Independent with assistive device(s)          PT Goals (current goals can now be found in the care plan section) Acute Rehab PT Goals Patient Stated Goal: not stated Time For Goal Achievement: 07/20/13 Potential to Achieve Goals: Good Progress towards PT goals: Progressing toward goals    Frequency  7X/week    PT Plan Current plan remains appropriate    Co-evaluation             End of Session Equipment Utilized During Treatment: Gait belt;Right knee immobilizer Activity Tolerance: Patient tolerated treatment well Patient left:  in chair;with call bell/phone within reach;with family/visitor present     Time: 1341-1406 PT Time Calculation (min): 25 min  Charges:                       G Codes:      Edit Ricciardelli 08/13/2013, 3:32 PM

## 2013-07-15 ENCOUNTER — Encounter (HOSPITAL_COMMUNITY): Payer: Self-pay | Admitting: Orthopedic Surgery

## 2013-07-15 LAB — CBC
HCT: 38.2 % (ref 36.0–46.0)
Hemoglobin: 11.5 g/dL — ABNORMAL LOW (ref 12.0–15.0)
MCH: 23.2 pg — ABNORMAL LOW (ref 26.0–34.0)
MCHC: 30.1 g/dL (ref 30.0–36.0)
MCV: 77.2 fL — ABNORMAL LOW (ref 78.0–100.0)
PLATELETS: 366 10*3/uL (ref 150–400)
RBC: 4.95 MIL/uL (ref 3.87–5.11)
RDW: 15.7 % — AB (ref 11.5–15.5)
WBC: 16.6 10*3/uL — AB (ref 4.0–10.5)

## 2013-07-15 NOTE — Progress Notes (Signed)
Physical Therapy Treatment Patient Details Name: [name] MRN: [id number] DOB: 1961-02-07 Today's Date: 07/15/2013    History of Present Illness Patient is a 53 y/o female admitted for right TKA.    PT Comments    Patient was able to walk increased distance today with step-through gait pattern.  She was also able to ascend/descend 2 steps with R rail with min guard.  Physician verbally instructed to d/c use of KI unless desired for comfort.  Patient is okay to D/C per PT perspective.  Follow Up Recommendations  Home health PT;Supervision/Assistance - 24 hour     Equipment Recommendations  None recommended by PT    Recommendations for Other Services       Precautions / Restrictions Precautions Precautions: Fall;Knee Precaution Comments: Reviewed precautions with pt Restrictions Weight Bearing Restrictions: Yes RLE Weight Bearing: Weight bearing as tolerated    Mobility  Bed Mobility Overal bed mobility: Needs Assistance Bed Mobility: Supine to Sit     Supine to sit: Min assist     General bed mobility comments: min assist up with right LE only  Transfers Overall transfer level: Needs assistance Equipment used: Rolling walker (2 wheeled) Transfers: Sit to/from Stand Sit to Stand: Supervision         General transfer comment: No cueing necessary  Ambulation/Gait Ambulation/Gait assistance: Min guard Ambulation Distance (Feet): 500 Feet Assistive device: Rolling walker (2 wheeled) Gait Pattern/deviations: Step-through pattern;Decreased step length - right;Decreased stance time - right     General Gait Details: Initial cueing for upright posture   Stairs Stairs: Yes Stairs assistance: Min guard Stair Management: One rail Right;Sideways Number of Stairs: 2 General stair comments: Pt able to ascend and descend two steps mod-I after 3 practice trials with min guard  Wheelchair Mobility    Modified Rankin (Stroke Patients Only)        Balance Overall balance assessment: Modified Independent         Standing balance support: During functional activity Standing balance-Leahy Scale: Fair Standing balance comment: Pt demonstrated weight shift with unilateral hand hold while practicing stairs                    Cognition Arousal/Alertness: Awake/alert Behavior During Therapy: WFL for tasks assessed/performed Overall Cognitive Status: Within Functional Limits for tasks assessed                      Exercises Total Joint Exercises Long Arc Quad: AAROM;Right;10 reps;Seated Knee Flexion: AROM;Right;10 reps;Seated Goniometric ROM: 2-84    General Comments        Pertinent Vitals/Pain Patient reports soreness today.  Pt premedicated with pain meds.    Home Living                      Prior Function            PT Goals (current goals can now be found in the care plan section) Acute Rehab PT Goals Time For Goal Achievement: 07/20/13 Potential to Achieve Goals: Good Progress towards PT goals: Progressing toward goals    Frequency       PT Plan Current plan remains appropriate    Co-evaluation             End of Session Equipment Utilized During Treatment: Gait belt;Right knee immobilizer Activity Tolerance: Patient tolerated treatment well Patient left: in chair;with call bell/phone within reach     Time: 0800-0837 PT Time Calculation (min): 37 min  Charges:                       G Codes:      Rahel Carlton, SPT 07/15/2013, 10:33 AM

## 2013-07-15 NOTE — Progress Notes (Signed)
Patient discharged to home accompanied by family. Discharge instructions and rx given and explained and patient stated understanding. IV was removed and patient left unit in a stable condition via wheelchair with all personal belongings. 

## 2013-07-15 NOTE — Progress Notes (Signed)
Patient ID: [name], female   DOB: 07-Jun-1960, 53 y.o.   MRN: [id number]     Subjective:  Patient reports pain as mild.  Patient states that she is doing much better and is ready to go home  Objective:   VITALS:   Filed Vitals:   07/14/13 0400 07/14/13 0511 07/14/13 1507 07/15/13 0502  BP:  138/63 137/80 124/64  Pulse:  64 61 64  Temp:  97.9 F (36.6 C) 98 F (36.7 C) 97.9 F (36.6 C)  TempSrc:    Oral  Resp: 16 16 16 16   Height:      Weight:      SpO2:  98% 90% 98%    ABD soft Sensation intact distally Dorsiflexion/Plantar flexion intact Incision: dressing C/D/I and no drainage Full ankle motion wound has no drainage and no sign of infection  Lab Results  Component Value Date   WBC 15.6* 07/14/2013   HGB 12.0 07/14/2013   HCT 38.5 07/14/2013   MCV 75.6* 07/14/2013   PLT 383 07/14/2013     Assessment/Plan: 2 Days Post-Op   Principal Problem:   Osteoarthritis of right knee Active Problems:   Morbid obesity   Knee osteoarthritis   Advance diet Up with therapy Discharge home with home health WBAT Dry dressing PRN Follow up with Dr 07/16/2013 in Two weeks   Dion Saucier 07/15/2013, 7:13 AM  Seen and agree.  07/17/2013, MD Cell [phone]

## 2013-07-15 NOTE — Progress Notes (Signed)
OT Cancellation Note  Patient Details Name: [name] MRN: [id number] DOB: 1960/08/04   Cancelled Treatment:    Reason Eval/Treat Not Completed: Other (comment). Pt seen today for ADLs and functional mobility. Pt reports she will not be purchasing tub bench through [facility], but may purchase one when she returns home. Pt plans to sponge bathe initially until pain and mobility improve. Pt with no further ADL concerns and declined opportunity to practice tub transfer at this time. No further acute OT needs.   Rae Lips [phone] 07/15/2013, 10:36 AM

## 2013-07-15 NOTE — Progress Notes (Signed)
Agree with SPT.    Raziyah Vanvleck, PT [phone]  

## 2013-07-15 NOTE — Discharge Summary (Signed)
Physician Discharge Summary  Patient ID: [name] MRN: [id number] DOB/AGE: 06/07/1960 53 y.o.  Admit date: 07/13/2013 Discharge date: 07/15/2013  Admission Diagnoses:  Osteoarthritis of right knee  Discharge Diagnoses:  Principal Problem:   Osteoarthritis of right knee Active Problems:   Morbid obesity   Knee osteoarthritis   Past Medical History  Diagnosis Date  . Morbid obesity   . Degenerative joint disease     Left TKA in 07/2009-Dr. Talor Cheema  . Chronic back pain   . Syncope     with palpitations; evaluated by [facility] in 2008 with normal echo  . Headache(784.0)     occasionally  . Joint pain   . Joint swelling   . Peripheral edema     to right leg;takes Furosemide occasionally and hasnt had one in a month  . Nocturia   . Hypothyroidism     was on Levothyroxine-has been off x 4 months  . Depression 2001    doesn't take any meds  . Osteoarthritis of right knee 07/13/2013    Surgeries: Procedure(s): TOTAL KNEE ARTHROPLASTY on 07/13/2013   Consultants (if any):    Discharged Condition: Improved  Hospital Course: [name] is an 53 y.o. female who was admitted 07/13/2013 with a diagnosis of Osteoarthritis of right knee and went to the operating room on 07/13/2013 and underwent the above named procedures.    She was given perioperative antibiotics:  Anti-infectives   Start     Dose/Rate Route Frequency Ordered Stop   07/13/13 1930  vancomycin (VANCOCIN) IVPB 1000 mg/200 mL premix     1,000 mg 200 mL/hr over 60 Minutes Intravenous Every 12 hours 07/13/13 1259 07/13/13 1929   07/13/13 0600  vancomycin (VANCOCIN) 1,500 mg in sodium chloride 0.9 % 500 mL IVPB     1,500 mg 250 mL/hr over 120 Minutes Intravenous On call to O.R. 07/12/13 1405 07/13/13 0925    .  She was given sequential compression devices, early ambulation, and as lovenox weight based for DVT prophylaxis.  She benefited maximally from the hospital stay and  there were no complications.    Recent vital signs:  Filed Vitals:   07/15/13 0502  BP: 124/64  Pulse: 64  Temp: 97.9 F (36.6 C)  Resp: 16    Recent laboratory studies:  Lab Results  Component Value Date   HGB 11.5* 07/15/2013   HGB 12.0 07/14/2013   HGB 12.2 07/13/2013   Lab Results  Component Value Date   WBC 16.6* 07/15/2013   PLT 366 07/15/2013   Lab Results  Component Value Date   INR 1.19 07/05/2013   Lab Results  Component Value Date   NA 137 07/14/2013   K 4.5 07/14/2013   CL 98 07/14/2013   CO2 25 07/14/2013   BUN 12 07/14/2013   CREATININE 0.64 07/14/2013   GLUCOSE 141* 07/14/2013    Discharge Medications:     Medication List    STOP taking these medications       HYDROcodone-acetaminophen 5-325 MG per tablet  Commonly known as:  NORCO/VICODIN      TAKE these medications       enoxaparin 30 MG/0.3ML injection  Commonly known as:  LOVENOX  Inject 0.85 mLs (85 mg total) into the skin daily.     gabapentin 300 MG capsule  Commonly known as:  NEURONTIN  Take 1 capsule (300 mg total) by mouth 2 (two) times daily.     methocarbamol 500 MG tablet  Commonly  known as:  ROBAXIN  Take 1 tablet (500 mg total) by mouth 4 (four) times daily.     oxyCODONE-acetaminophen 10-325 MG per tablet  Commonly known as:  PERCOCET  Take 1-2 tablets by mouth every 6 (six) hours as needed for pain. MAXIMUM TOTAL ACETAMINOPHEN DOSE IS 4000 MG PER DAY     promethazine 25 MG tablet  Commonly known as:  PHENERGAN  Take 1 tablet (25 mg total) by mouth every 6 (six) hours as needed for nausea or vomiting.     triamterene-hydrochlorothiazide 37.5-25 MG per tablet  Commonly known as:  MAXZIDE-25  Take 1 tablet by mouth daily.        Diagnostic Studies: Dg Knee Right Port  07/13/2013   CLINICAL DATA:  Post knee arthroplasty  EXAM: PORTABLE RIGHT KNEE - 1-2 VIEW  COMPARISON:  Portable exam 1124 hr without priors for comparison  FINDINGS: Components of RIGHT knee prosthesis in  expected positions.  No acute fracture or dislocation.  No periprosthetic lucency.  Expected soft tissue changes.  No radiopaque foreign bodies.  IMPRESSION: RIGHT knee prosthesis without acute complication.   Electronically Signed   By: Ulyses Southward M.D.   On: 07/13/2013 14:06    Disposition: 01-Home or Self Care      Discharge Orders   Future Orders Complete By Expires   Weight bearing as tolerated  As directed    Questions:     Laterality:     Extremity:        Follow-up Information   Follow up with Eulas Post, MD. Schedule an appointment as soon as possible for a visit in 2 weeks.   Specialty:  Orthopedic Surgery   Contact information:   [address] [place] [address] [phone]       Follow up with [facility]. (Someone from [facility] will contact you concerning start date and time for physical therapy.)    Contact information:   [address] [place] [address] [phone]        Signed: Eulas Post 07/15/2013, 8:15 AM

## 2013-08-03 ENCOUNTER — Ambulatory Visit: Payer: Medicare Other | Attending: Orthopedic Surgery | Admitting: Physical Therapy

## 2013-08-03 DIAGNOSIS — Z96659 Presence of unspecified artificial knee joint: Secondary | ICD-10-CM | POA: Diagnosis not present

## 2013-08-03 DIAGNOSIS — IMO0001 Reserved for inherently not codable concepts without codable children: Secondary | ICD-10-CM | POA: Diagnosis not present

## 2013-08-03 DIAGNOSIS — M25569 Pain in unspecified knee: Secondary | ICD-10-CM | POA: Insufficient documentation

## 2013-08-03 DIAGNOSIS — R5381 Other malaise: Secondary | ICD-10-CM | POA: Diagnosis not present

## 2013-08-03 DIAGNOSIS — R269 Unspecified abnormalities of gait and mobility: Secondary | ICD-10-CM | POA: Diagnosis not present

## 2013-08-03 DIAGNOSIS — M25669 Stiffness of unspecified knee, not elsewhere classified: Secondary | ICD-10-CM | POA: Insufficient documentation

## 2013-08-10 ENCOUNTER — Ambulatory Visit: Payer: Medicare Other | Admitting: Physical Therapy

## 2013-08-10 DIAGNOSIS — IMO0001 Reserved for inherently not codable concepts without codable children: Secondary | ICD-10-CM | POA: Diagnosis not present

## 2013-08-12 ENCOUNTER — Ambulatory Visit: Payer: Medicare Other | Admitting: Physical Therapy

## 2013-08-12 DIAGNOSIS — IMO0001 Reserved for inherently not codable concepts without codable children: Secondary | ICD-10-CM | POA: Diagnosis not present

## 2013-08-13 ENCOUNTER — Other Ambulatory Visit: Payer: Self-pay | Admitting: Family Medicine

## 2013-08-17 ENCOUNTER — Encounter: Payer: Medicare Other | Admitting: Physical Therapy

## 2013-08-18 ENCOUNTER — Ambulatory Visit: Payer: Medicare Other | Attending: Orthopedic Surgery | Admitting: Physical Therapy

## 2013-08-18 DIAGNOSIS — R5381 Other malaise: Secondary | ICD-10-CM | POA: Diagnosis not present

## 2013-08-18 DIAGNOSIS — M25569 Pain in unspecified knee: Secondary | ICD-10-CM | POA: Insufficient documentation

## 2013-08-18 DIAGNOSIS — M25669 Stiffness of unspecified knee, not elsewhere classified: Secondary | ICD-10-CM | POA: Diagnosis not present

## 2013-08-18 DIAGNOSIS — Z96659 Presence of unspecified artificial knee joint: Secondary | ICD-10-CM | POA: Insufficient documentation

## 2013-08-18 DIAGNOSIS — IMO0001 Reserved for inherently not codable concepts without codable children: Secondary | ICD-10-CM | POA: Diagnosis present

## 2013-08-18 DIAGNOSIS — R269 Unspecified abnormalities of gait and mobility: Secondary | ICD-10-CM | POA: Insufficient documentation

## 2013-08-20 ENCOUNTER — Ambulatory Visit: Payer: Medicare Other | Admitting: Physical Therapy

## 2013-08-20 DIAGNOSIS — IMO0001 Reserved for inherently not codable concepts without codable children: Secondary | ICD-10-CM | POA: Diagnosis not present

## 2013-08-25 ENCOUNTER — Ambulatory Visit: Payer: Medicare Other | Admitting: Physical Therapy

## 2013-08-25 DIAGNOSIS — IMO0001 Reserved for inherently not codable concepts without codable children: Secondary | ICD-10-CM | POA: Diagnosis not present

## 2013-08-30 ENCOUNTER — Ambulatory Visit: Payer: Medicare Other | Admitting: Physical Therapy

## 2013-08-30 DIAGNOSIS — IMO0001 Reserved for inherently not codable concepts without codable children: Secondary | ICD-10-CM | POA: Diagnosis not present

## 2013-09-03 ENCOUNTER — Ambulatory Visit: Payer: Medicare Other | Admitting: Physical Therapy

## 2013-09-03 DIAGNOSIS — IMO0001 Reserved for inherently not codable concepts without codable children: Secondary | ICD-10-CM | POA: Diagnosis not present

## 2013-09-09 ENCOUNTER — Ambulatory Visit: Payer: Medicare Other | Admitting: Physical Therapy

## 2013-09-09 DIAGNOSIS — IMO0001 Reserved for inherently not codable concepts without codable children: Secondary | ICD-10-CM | POA: Diagnosis not present

## 2013-09-14 ENCOUNTER — Ambulatory Visit: Payer: Medicare Other | Admitting: Physical Therapy

## 2013-09-14 DIAGNOSIS — IMO0001 Reserved for inherently not codable concepts without codable children: Secondary | ICD-10-CM | POA: Diagnosis not present

## 2013-09-16 ENCOUNTER — Encounter: Payer: Medicare Other | Admitting: Physical Therapy

## 2013-10-18 ENCOUNTER — Other Ambulatory Visit: Payer: Self-pay | Admitting: Family Medicine

## 2013-12-31 ENCOUNTER — Other Ambulatory Visit: Payer: Self-pay

## 2014-01-03 ENCOUNTER — Telehealth: Payer: Self-pay

## 2014-01-03 ENCOUNTER — Other Ambulatory Visit: Payer: Self-pay

## 2014-01-03 MED ORDER — IBUPROFEN 800 MG PO TABS: ORAL_TABLET | ORAL | Status: DC

## 2014-01-03 NOTE — Telephone Encounter (Signed)
Med refilled.

## 2014-01-19 ENCOUNTER — Other Ambulatory Visit: Payer: Self-pay | Admitting: Family Medicine

## 2014-01-19 DIAGNOSIS — Z139 Encounter for screening, unspecified: Secondary | ICD-10-CM

## 2014-01-27 ENCOUNTER — Other Ambulatory Visit: Payer: Self-pay | Admitting: Family Medicine

## 2014-01-31 ENCOUNTER — Ambulatory Visit (HOSPITAL_COMMUNITY)
Admission: RE | Admit: 2014-01-31 | Discharge: 2014-01-31 | Disposition: A | Discharge status: Home / Self Care | Payer: Medicare Other | Source: Ambulatory Visit | Attending: Family Medicine | Admitting: Family Medicine

## 2014-01-31 DIAGNOSIS — Z139 Encounter for screening, unspecified: Secondary | ICD-10-CM | POA: Insufficient documentation

## 2014-02-02 ENCOUNTER — Other Ambulatory Visit: Payer: Self-pay | Admitting: Family Medicine

## 2014-02-15 ENCOUNTER — Other Ambulatory Visit: Payer: Self-pay | Admitting: Family Medicine

## 2014-02-15 ENCOUNTER — Encounter: Payer: Self-pay | Admitting: Family Medicine

## 2014-02-15 ENCOUNTER — Ambulatory Visit (INDEPENDENT_AMBULATORY_CARE_PROVIDER_SITE_OTHER): Payer: Medicare Other

## 2014-02-15 ENCOUNTER — Ambulatory Visit (INDEPENDENT_AMBULATORY_CARE_PROVIDER_SITE_OTHER): Payer: Medicare Other | Admitting: Family Medicine

## 2014-02-15 VITALS — BP 132/82 | HR 75 | Resp 16 | Ht 62.0 in | Wt 376.0 lb

## 2014-02-15 DIAGNOSIS — N3946 Mixed incontinence: Secondary | ICD-10-CM

## 2014-02-15 DIAGNOSIS — N3091 Cystitis, unspecified with hematuria: Secondary | ICD-10-CM

## 2014-02-15 DIAGNOSIS — Z9889 Other specified postprocedural states: Secondary | ICD-10-CM

## 2014-02-15 DIAGNOSIS — E890 Postprocedural hypothyroidism: Secondary | ICD-10-CM

## 2014-02-15 DIAGNOSIS — Z23 Encounter for immunization: Secondary | ICD-10-CM

## 2014-02-15 DIAGNOSIS — E559 Vitamin D deficiency, unspecified: Secondary | ICD-10-CM

## 2014-02-15 DIAGNOSIS — E039 Hypothyroidism, unspecified: Secondary | ICD-10-CM

## 2014-02-15 DIAGNOSIS — N3001 Acute cystitis with hematuria: Secondary | ICD-10-CM

## 2014-02-15 DIAGNOSIS — I10 Essential (primary) hypertension: Secondary | ICD-10-CM

## 2014-02-15 DIAGNOSIS — M170 Bilateral primary osteoarthritis of knee: Secondary | ICD-10-CM

## 2014-02-15 DIAGNOSIS — R7303 Prediabetes: Secondary | ICD-10-CM

## 2014-02-15 DIAGNOSIS — R7309 Other abnormal glucose: Secondary | ICD-10-CM

## 2014-02-15 LAB — POCT URINALYSIS DIPSTICK
BILIRUBIN UA: NEGATIVE
Glucose, UA: NEGATIVE
Ketones, UA: NEGATIVE
NITRITE UA: NEGATIVE
Protein, UA: NEGATIVE
Spec Grav, UA: 1.015
UROBILINOGEN UA: 0.2
pH, UA: 5.5

## 2014-02-15 MED ORDER — PHENTERMINE HCL 37.5 MG PO TBDP: 1.0000 | ORAL_TABLET | Freq: Every day | ORAL | Status: DC

## 2014-02-15 NOTE — Patient Instructions (Addendum)
Physical exal exam in 11 weeks, call if you need me before   Flu vaccine today  Labs today  New topical med for knee pain, sent top CA, Ok to use tylenol 500mg  one to two daily as needed for uncontrolled pain  New is phentermine one daily, commit to regular exercise and healthy diet  Weight loss goal of 4 to 5 pounds each month is realistic   Practice exercise discussed for urine and "pottying" on schedule.  If you have infection you will be  Notified  All the best

## 2014-02-15 NOTE — Progress Notes (Signed)
   Subjective:    Patient ID: [name], female    DOB: August 24, 1960, 53 y.o.   MRN: [id number]  HPI The PT is here for follow up and re-evaluation of chronic medical conditions, medication management and review of any available recent lab and radiology data.  Preventive health is updated, specifically  Cancer screening and Immunization.   C/o increased urinary frequency with wetting , progressively worsening in the past several weeks, denies fever, flank pain or chills. Wants help with weight ;loss    Review of Systems See HPI Denies recent fever or chills. Denies sinus pressure, nasal congestion, ear pain or sore throat. Denies chest congestion, productive cough or wheezing. Denies chest pains, palpitations and leg swelling Denies abdominal pain, nausea, vomiting,diarrhea or constipation.   . Knee pain and mobility improved following surgery Denies headaches, seizures, numbness, or tingling. Denies depression, anxiety or insomnia. Denies skin break down or rash.        Objective:   Physical Exam BP 132/82 mmHg  Pulse 75  Resp 16  Ht 5\' 2"  (1.575 m)  Wt 376 lb (170.552 kg)  BMI 68.75 kg/m2  SpO2 96% Patient alert and oriented and in no cardiopulmonary distress.  HEENT: No facial asymmetry, EOMI,   oropharynx pink and moist.  Neck supple no JVD, no mass.  Chest: Clear to auscultation bilaterally.  CVS: S1, S2 no murmurs, no S3.Regular rate.  ABD: Soft non tender.   Ext: No edema  MS: Adequate though reduced  ROM spine,, hips and knees.  Skin: Intact, no ulcerations or rash noted.  Psych: Good eye contact, normal affect. Memory intact not anxious or depressed appearing.  CNS: CN 2-12 intact, power,  normal throughout.no focal deficits noted.        Assessment & Plan:  Knee osteoarthritis improved post relplacement but requests anti inflammatory, topical to be presdscribed, an advised tylenol 500mg  one to teo daily as needed, for uncomntrolled  pain  Prediabetes Deteriorated Patient educated about the importance of limiting  Carbohydrate intake , the need to commit to daily physical activity for a minimum of 30 minutes , and to commit weight loss. The fact that changes in all these areas will reduce or eliminate all together the development of diabetes is stressed.     Morbid obesity Deteriorated. Patient re-educated about  the importance of commitment to a  minimum of 150 minutes of exercise per week. The importance of healthy food choices with portion control discussed. Encouraged to start a food diary, count calories and to consider  joining a support group. Sample diet sheets offered. Goals set by the patient for the next several months. Start daily phentermine     Vitamin D deficiency Updated lab reveals the need for weekly sipplements for at least 6 month  S/P partial thyroidectomy Updated lab reveals normal thyroid function, no need to supplement  Need for prophylactic vaccination and inoculation against influenza Vaccine administered at visit.   Cystitis with hematuria C/o frequency with incontinence, UA abn ,will follow c/s and treat if neeeded  Urinary incontinence Behavior modification only at this time, no medication

## 2014-02-15 NOTE — Assessment & Plan Note (Signed)
improved post relplacement but requests anti inflammatory, topical to be presdscribed, an advised tylenol 500mg  one to teo daily as needed, for uncomntrolled pain

## 2014-02-16 LAB — COMPREHENSIVE METABOLIC PANEL
ALT: 9 U/L (ref 0–35)
AST: 14 U/L (ref 0–37)
Albumin: 3.9 g/dL (ref 3.5–5.2)
Alkaline Phosphatase: 76 U/L (ref 39–117)
BUN: 8 mg/dL (ref 6–23)
CHLORIDE: 103 meq/L (ref 96–112)
CO2: 27 mEq/L (ref 19–32)
Calcium: 9.2 mg/dL (ref 8.4–10.5)
Creat: 0.67 mg/dL (ref 0.50–1.10)
GLUCOSE: 86 mg/dL (ref 70–99)
Potassium: 4.4 mEq/L (ref 3.5–5.3)
SODIUM: 139 meq/L (ref 135–145)
TOTAL PROTEIN: 8 g/dL (ref 6.0–8.3)
Total Bilirubin: 0.7 mg/dL (ref 0.2–1.2)

## 2014-02-16 LAB — LIPID PANEL
Cholesterol: 127 mg/dL (ref 0–200)
HDL: 43 mg/dL (ref >39)
LDL CALC: 75 mg/dL (ref 0–99)
TRIGLYCERIDES: 43 mg/dL (ref <150)
Total CHOL/HDL Ratio: 3 Ratio
VLDL: 9 mg/dL (ref 0–40)

## 2014-02-16 LAB — FERRITIN: Ferritin: 68 ng/mL (ref 10–291)

## 2014-02-16 LAB — VITAMIN D 25 HYDROXY (VIT D DEFICIENCY, FRACTURES): Vit D, 25-Hydroxy: 14 ng/mL — ABNORMAL LOW (ref 30–100)

## 2014-02-16 LAB — CBC
HCT: 37.5 % (ref 36.0–46.0)
Hemoglobin: 11.2 g/dL — ABNORMAL LOW (ref 12.0–15.0)
MCH: 21.6 pg — ABNORMAL LOW (ref 26.0–34.0)
MCHC: 29.9 g/dL — ABNORMAL LOW (ref 30.0–36.0)
MCV: 72.3 fL — ABNORMAL LOW (ref 78.0–100.0)
MPV: 10.1 fL (ref 9.4–12.4)
PLATELETS: 406 10*3/uL — AB (ref 150–400)
RBC: 5.19 MIL/uL — AB (ref 3.87–5.11)
RDW: 16.9 % — AB (ref 11.5–15.5)
WBC: 7 10*3/uL (ref 4.0–10.5)

## 2014-02-16 LAB — TSH: TSH: 4.086 u[IU]/mL (ref 0.350–4.500)

## 2014-02-16 LAB — IRON: Iron: 29 ug/dL — ABNORMAL LOW (ref 42–145)

## 2014-02-16 LAB — HEMOGLOBIN A1C
Hgb A1c MFr Bld: 6.3 % — ABNORMAL HIGH (ref <5.7)
MEAN PLASMA GLUCOSE: 134 mg/dL — AB (ref <117)

## 2014-02-17 LAB — URINE CULTURE

## 2014-02-21 ENCOUNTER — Other Ambulatory Visit: Payer: Self-pay

## 2014-02-21 MED ORDER — VITAMIN D (ERGOCALCIFEROL) 1.25 MG (50000 UNIT) PO CAPS: 50000.0000 [IU] | ORAL_CAPSULE | ORAL | Status: DC

## 2014-03-15 DIAGNOSIS — Z23 Encounter for immunization: Secondary | ICD-10-CM | POA: Insufficient documentation

## 2014-03-15 DIAGNOSIS — R32 Unspecified urinary incontinence: Secondary | ICD-10-CM | POA: Insufficient documentation

## 2014-03-15 DIAGNOSIS — N3091 Cystitis, unspecified with hematuria: Secondary | ICD-10-CM | POA: Insufficient documentation

## 2014-03-15 NOTE — Assessment & Plan Note (Signed)
C/o frequency with incontinence, UA abn ,will follow c/s and treat if neeeded

## 2014-03-15 NOTE — Assessment & Plan Note (Signed)
Behavior modification only at this time, no medication

## 2014-03-15 NOTE — Assessment & Plan Note (Signed)
Vaccine administered at visit.  

## 2014-03-15 NOTE — Assessment & Plan Note (Signed)
Deteriorated. Patient re-educated about  the importance of commitment to a  minimum of 150 minutes of exercise per week. The importance of healthy food choices with portion control discussed. Encouraged to start a food diary, count calories and to consider  joining a support group. Sample diet sheets offered. Goals set by the patient for the next several months. Start daily phentermine

## 2014-03-15 NOTE — Assessment & Plan Note (Signed)
Updated lab reveals the need for weekly sipplements for at least 6 month

## 2014-03-15 NOTE — Assessment & Plan Note (Signed)
Deteriorated Patient educated about the importance of limiting  Carbohydrate intake , the need to commit to daily physical activity for a minimum of 30 minutes , and to commit weight loss. The fact that changes in all these areas will reduce or eliminate all together the development of diabetes is stressed.    

## 2014-03-15 NOTE — Assessment & Plan Note (Signed)
Updated lab reveals normal thyroid function, no need to supplement

## 2014-05-13 DIAGNOSIS — G43909 Migraine, unspecified, not intractable, without status migrainosus: Secondary | ICD-10-CM | POA: Diagnosis not present

## 2014-06-14 ENCOUNTER — Encounter: Payer: Self-pay | Admitting: Family Medicine

## 2014-06-14 ENCOUNTER — Ambulatory Visit (INDEPENDENT_AMBULATORY_CARE_PROVIDER_SITE_OTHER): Payer: Medicare Other | Admitting: Family Medicine

## 2014-06-14 VITALS — BP 140/80 | HR 76 | Resp 18 | Ht 62.0 in | Wt 377.0 lb

## 2014-06-14 DIAGNOSIS — R7309 Other abnormal glucose: Secondary | ICD-10-CM

## 2014-06-14 DIAGNOSIS — Z0000 Encounter for general adult medical examination without abnormal findings: Secondary | ICD-10-CM

## 2014-06-14 DIAGNOSIS — I10 Essential (primary) hypertension: Secondary | ICD-10-CM

## 2014-06-14 DIAGNOSIS — Z1159 Encounter for screening for other viral diseases: Secondary | ICD-10-CM

## 2014-06-14 DIAGNOSIS — F329 Major depressive disorder, single episode, unspecified: Secondary | ICD-10-CM

## 2014-06-14 DIAGNOSIS — F32A Depression, unspecified: Secondary | ICD-10-CM

## 2014-06-14 DIAGNOSIS — F411 Generalized anxiety disorder: Secondary | ICD-10-CM

## 2014-06-14 DIAGNOSIS — R7303 Prediabetes: Secondary | ICD-10-CM

## 2014-06-14 MED ORDER — ALPRAZOLAM 0.25 MG PO TABS: 0.2500 mg | ORAL_TABLET | Freq: Every evening | ORAL | Status: DC | PRN

## 2014-06-14 MED ORDER — LOSARTAN POTASSIUM 50 MG PO TABS: 50.0000 mg | ORAL_TABLET | Freq: Every day | ORAL | Status: DC

## 2014-06-14 NOTE — Progress Notes (Signed)
Subjective:    Patient ID: [name], female    DOB: 1960-10-03, 54 y.o.   MRN: [id number]  HPI Preventive Screening-Counseling & Management   Patient present here today for a Medicare annual wellness visit.   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 Yes, personal stressors, loss of a dear friend, and near fatal accident last week involving her father , currently i ICU  Over the past two weeks, have you felt little interest or pleasure in doing things? Yes Have you lost interest or pleasure in daily life? No  Do you often feel hopeless? Yes Do you cry easily over simple problems? No   Activities of Daily Living  In your present state of health, do you have any difficulty performing the following activities?  Driving?: No Managing money?: No Feeding yourself?:No Getting from bed to chair?: Due to mobility problems Climbing a flight of stairs?: Yes Preparing food and eating?:No Bathing or showering?:No Getting dressed?:No Getting to the toilet?:No Using the toilet?:No Moving around from place to place?: Yes, morbid obesity and osteoarthritis affecting knees  Fall Risk Assessment In the past year have you fallen or had a near fall?:No Are you currently taking any medications that make you dizzy?:No   Hearing Difficulties: No Do you often ask people to speak up or repeat themselves?:No Do you experience ringing or noises in your ears?:No Do  you have difficulty understanding soft or whispered voices?:No  Cognitive Testing  Alert? Yes Normal Appearance?Yes  Oriented to person? Yes Place? Yes  Time? Yes  Displays appropriate judgment?Yes  Can read the correct time from a watch face? yes Are you having problems remembering things? Yes but related to current stress  Advanced Directives have been discussed with the patient?Yes and brochure provided   List the Names of Other Physician/Practitioners you currently use: careteams updated    Indicate any recent Medical Services you may have received from other than [facility] providers in the past year (date may be approximate).   Assessment:    Annual Wellness Exam   Plan:    During the course of the visit the patient was educated and counseled about appropriate screening and preventive services including:  A healthy diet is rich in fruit, vegetables and whole grains. Poultry fish, nuts and beans are a healthy choice for protein rather then red meat. A low sodium diet and drinking 64 ounces of water daily is generally recommended. Oils and sweet should be limited. Carbohydrates especially for those who are diabetic or overweight, should be limited to 30-45 gram per meal. It is important to eat on a regular schedule, at least 3 times daily. Snacks should be primarily fruits, vegetables or nuts. It is important that you exercise regularly at least 30 minutes 5 times a week. If you develop chest pain, have severe difficulty breathing, or feel very tired, stop exercising immediately and seek medical attention  Immunization reviewed and updated. Cancer screening reviewed and updated  Patient Instructions (the written plan) was given to the patient.  Medicare Attestation  I have personally reviewed:  The patient's medical and social history  Their use of alcohol, tobacco or illicit drugs  Their current medications and supplements  The patient's functional ability including ADLs,fall  risks, home safety risks, cognitive, and hearing and visual impairment  Diet and physical activities  Evidence for depression or mood disorders  The patient's weight, height, BMI, and visual acuity have been recorded in the chart. I have made referrals, counseling, and provided education to the patient based on review of the above and I have provided the patient with a written personalized care plan for preventive services.      Review of Systems     Objective:   Physical Exam BP 140/80 mmHg  Pulse 76  Resp 18  Ht 5\' 2"  (1.575 m)  Wt 377 lb (171.006 kg)  BMI 68.94 kg/m2  SpO2 96%        Assessment & Plan:  Medicare annual wellness visit, subsequent Annual exam as documented. Counseling done  re healthy lifestyle involving commitment to 150 minutes exercise per week, heart healthy diet, and attaining healthy weight.The importance of adequate sleep also discussed. Regular seat belt use and home safety, is also discussed. Changes in health habits are decided on by the patient with goals and time frames  set for achieving them. Immunization and cancer screening needs are specifically addressed at this visit.    Anxiety state Increased anxiety and depression related to recent loss and father's health reportedly . Short course of xanax prescribed. Therapy will be helpful, she has been before and wants to return   Depression Uncontrolled, reports being triggered by recent loss, she is to resume fluoxetine Not actively suicidal or homicidal   Essential hypertension Blood pressure elevated at visit , and in the past has required medication, she wilol start medication today again  DASH diet and commitment to daily physical activity for a minimum of 30 minutes discussed and encouraged, as a part of hypertension management. The importance of attaining a healthy weight is also discussed.  BP/Weight 06/14/2014 02/15/2014 07/15/2013 07/13/2013 07/08/2013 07/05/2013 03/03/2013  Systolic  BP 140 03/05/2013 124 - 160 130 175  Diastolic BP 80 82 64 - 100 84 93  Wt. (Lbs) 377 376 - 372 376.4 379.41 372  BMI 68.94 68.75 - 68.02 68.83 69.38 68.02    CMP Latest Ref Rng 02/15/2014 07/14/2013 07/13/2013  Glucose 70 - 99 mg/dL 86 07/15/2013) -  BUN 6 - 23 mg/dL 8 12 -  Creatinine 803(O - 1.10 mg/dL 1.22 4.82 5.00  Sodium 135 - 145 mEq/L 139 137 -  Potassium 3.5 - 5.3 mEq/L 4.4 4.5 -  Chloride 96 - 112 mEq/L 103 98 -  CO2 19 - 32 mEq/L 27 25 -  Calcium 8.4 - 10.5 mg/dL 9.2 9.4 -  Total Protein 6.0 - 8.3 g/dL 8.0 - -  Total Bilirubin 0.2 - 1.2 mg/dL 0.7 - -  Alkaline Phos 39 - 117 U/L 76 - -  AST 0 - 37 U/L 14 - -  ALT 0 - 35 U/L 9 - -

## 2014-06-14 NOTE — Assessment & Plan Note (Signed)

## 2014-06-14 NOTE — Patient Instructions (Signed)
F/u in 3 month, call if you need me before   My condolence and concern re recent loss and Dad's current health  Short course of xanax at bedtime only for anxiety, and you are referred to therapist  It is important that you exercise regularly at least 30 minutes 5 times a week. If you develop chest pain, have severe difficulty breathing, or feel very tired, stop exercising immediately and seek medical attention   A healthy diet is rich in fruit, vegetables and whole grains. Poultry fish, nuts and beans are a healthy choice for protein rather then red meat. A low sodium diet and drinking 64 ounces of water daily is generally recommended. Oils and sweet should be limited. Carbohydrates especially for those who are diabetic or overweight, should be limited to 34-45 gram per meal. It is important to eat on a regular schedule, at least 3 times daily. Snacks should be primarily fruits, vegetables or nuts.  HBA1C, chem 7 and HIV test , if agree , today  New medication started today for blood pressure

## 2014-06-19 NOTE — Assessment & Plan Note (Addendum)
Blood pressure elevated at visit , and in the past has required medication, she wilol start medication today again  DASH diet and commitment to daily physical activity for a minimum of 30 minutes discussed and encouraged, as a part of hypertension management. The importance of attaining a healthy weight is also discussed.  BP/Weight 06/14/2014 02/15/2014 07/15/2013 07/13/2013 07/08/2013 07/05/2013 03/03/2013  Systolic BP 140 132 124 - 160 728 175  Diastolic BP 80 82 64 - 100 84 93  Wt. (Lbs) 377 376 - 372 376.4 379.41 372  BMI 68.94 68.75 - 68.02 68.83 69.38 68.02    CMP Latest Ref Rng 02/15/2014 07/14/2013 07/13/2013  Glucose 70 - 99 mg/dL 86 206(O) -  BUN 6 - 23 mg/dL 8 12 -  Creatinine 1.56 - 1.10 mg/dL 1.53 7.94 3.27  Sodium 135 - 145 mEq/L 139 137 -  Potassium 3.5 - 5.3 mEq/L 4.4 4.5 -  Chloride 96 - 112 mEq/L 103 98 -  CO2 19 - 32 mEq/L 27 25 -  Calcium 8.4 - 10.5 mg/dL 9.2 9.4 -  Total Protein 6.0 - 8.3 g/dL 8.0 - -  Total Bilirubin 0.2 - 1.2 mg/dL 0.7 - -  Alkaline Phos 39 - 117 U/L 76 - -  AST 0 - 37 U/L 14 - -  ALT 0 - 35 U/L 9 - -

## 2014-06-19 NOTE — Assessment & Plan Note (Signed)
Increased anxiety and depression related to recent loss and father's health reportedly . Short course of xanax prescribed. Therapy will be helpful, she has been before and wants to return

## 2014-06-19 NOTE — Assessment & Plan Note (Signed)
Uncontrolled, reports being triggered by recent loss, she is to resume fluoxetine Not actively suicidal or homicidal

## 2014-07-01 LAB — HM DIABETES EYE EXAM

## 2014-07-08 ENCOUNTER — Telehealth (HOSPITAL_COMMUNITY): Payer: Self-pay

## 2014-07-14 DIAGNOSIS — I10 Essential (primary) hypertension: Secondary | ICD-10-CM | POA: Diagnosis not present

## 2014-07-14 DIAGNOSIS — Z1159 Encounter for screening for other viral diseases: Secondary | ICD-10-CM | POA: Diagnosis not present

## 2014-07-14 DIAGNOSIS — R7309 Other abnormal glucose: Secondary | ICD-10-CM | POA: Diagnosis not present

## 2014-07-19 LAB — HEMOGLOBIN A1C: A1c: 6.5

## 2014-07-27 ENCOUNTER — Telehealth: Payer: Self-pay | Admitting: *Deleted

## 2014-07-27 NOTE — Telephone Encounter (Signed)
Noted  

## 2014-07-27 NOTE — Telephone Encounter (Signed)
Pt called and LMOM stating her daughter will be dropping off a handicap form for Dr. Lodema Hong to fill out and pt is requesting it to be longer than 6 months this time. Please advise

## 2014-09-06 ENCOUNTER — Encounter: Payer: Self-pay | Admitting: Family Medicine

## 2014-09-06 ENCOUNTER — Ambulatory Visit (INDEPENDENT_AMBULATORY_CARE_PROVIDER_SITE_OTHER): Payer: Medicare Other | Admitting: Family Medicine

## 2014-09-06 VITALS — BP 140/84 | HR 89 | Resp 16 | Ht 62.0 in | Wt 370.4 lb

## 2014-09-06 DIAGNOSIS — E559 Vitamin D deficiency, unspecified: Secondary | ICD-10-CM

## 2014-09-06 DIAGNOSIS — M546 Pain in thoracic spine: Secondary | ICD-10-CM | POA: Diagnosis not present

## 2014-09-06 DIAGNOSIS — G43109 Migraine with aura, not intractable, without status migrainosus: Secondary | ICD-10-CM | POA: Diagnosis not present

## 2014-09-06 DIAGNOSIS — R7303 Prediabetes: Secondary | ICD-10-CM

## 2014-09-06 DIAGNOSIS — Z113 Encounter for screening for infections with a predominantly sexual mode of transmission: Secondary | ICD-10-CM | POA: Diagnosis not present

## 2014-09-06 DIAGNOSIS — R7309 Other abnormal glucose: Secondary | ICD-10-CM

## 2014-09-06 DIAGNOSIS — I10 Essential (primary) hypertension: Secondary | ICD-10-CM

## 2014-09-06 MED ORDER — METHYLPREDNISOLONE ACETATE 80 MG/ML IJ SUSP
80.0000 mg | Freq: Once | INTRAMUSCULAR | Status: AC
  Administered 2014-09-06: 80 mg via INTRAMUSCULAR

## 2014-09-06 MED ORDER — PREDNISONE 5 MG (21) PO TBPK: 5.0000 mg | ORAL_TABLET | ORAL | Status: DC

## 2014-09-06 MED ORDER — PHENTERMINE HCL 37.5 MG PO TBDP: ORAL_TABLET | ORAL | Status: DC

## 2014-09-06 MED ORDER — GABAPENTIN 300 MG PO CAPS: 300.0000 mg | ORAL_CAPSULE | Freq: Every day | ORAL | Status: DC

## 2014-09-06 MED ORDER — IBUPROFEN 800 MG PO TABS: 800.0000 mg | ORAL_TABLET | Freq: Three times a day (TID) | ORAL | Status: DC | PRN

## 2014-09-06 MED ORDER — KETOROLAC TROMETHAMINE 60 MG/2ML IM SOLN
60.0000 mg | Freq: Once | INTRAMUSCULAR | Status: AC
  Administered 2014-09-06: 60 mg via INTRAMUSCULAR

## 2014-09-06 NOTE — Assessment & Plan Note (Signed)
Uncontrolled.Toradol and depo medrol administered IM in the office , to be followed by a short course of oral prednisone and NSAIDS. 2 weeks, worse x 4 days after sweeping

## 2014-09-06 NOTE — Patient Instructions (Signed)
Physical exam in 4 month, call if you  need  Me before  Injections in office and medication sent for short and long term use for pain  Start tylenol 50 0mg  one  To two daily, and new is gabapentin one at bedtime  CONGRATS on  Weight loss  Info today on access to surgery from nurse  Labs non fasting no later than tomorrow  Take hALF phentermine daily, and switch the plate sizes  Please work on good  health habits so that your health will improve. 1. Commitment to daily physical activity for 30 to 60  minutes, if you are able to do this.  2. Commitment to wise food choices. Aim for half of your  food intake to be vegetable and fruit, one quarter starchy foods, and one quarter protein. Try to eat on a regular schedule  3 meals per day, snacking between meals should be limited to vegetables or fruits or small portions of nuts. 64 ounces of water per day is generally recommended, unless you have specific health conditions, like heart failure or kidney failure where you will need to limit fluid intake.  3. Commitment to sufficient and a  good quality of physical and mental rest daily, generally between 6 to 8 hours per day.  WITH PERSISTANCE AND PERSEVERANCE, THE IMPOSSIBLE , BECOMES THE NORM!  Thanks for choosing [facility], we consider it a privelige to serve you.

## 2014-09-07 ENCOUNTER — Ambulatory Visit: Payer: Medicare Other | Admitting: Family Medicine

## 2014-09-07 DIAGNOSIS — R7309 Other abnormal glucose: Secondary | ICD-10-CM | POA: Diagnosis not present

## 2014-09-19 NOTE — Assessment & Plan Note (Signed)
Patient educated about the importance of limiting  Carbohydrate intake , the need to commit to daily physical activity for a minimum of 30 minutes , and to commit weight loss. The fact that changes in all these areas will reduce or eliminate all together the development of diabetes is stressed.   Diabetic Labs Latest Ref Rng 02/15/2014 07/14/2013 07/13/2013 07/08/2013 07/05/2013  HbA1c <5.7 % 6.3(H) - - 6.2(H) -  Chol 0 - 200 mg/dL 568 - - - -  HDL >12 mg/dL 43 - - - -  Calc LDL 0 - 99 mg/dL 75 - - - -  Triglycerides <150 mg/dL 43 - - - -  Creatinine 0.50 - 1.10 mg/dL 7.51 7.00 1.74 - 9.44   BP/Weight 09/06/2014 06/14/2014 02/15/2014 07/15/2013 07/13/2013 07/08/2013 07/05/2013  Systolic BP 140 140 132 124 - 967 130  Diastolic BP 84 80 82 64 - 100 84  Wt. (Lbs) 370.4 377 376 - 372 376.4 379.41  BMI 67.73 68.94 68.75 - 68.02 68.83 69.38   No flowsheet data found.

## 2014-09-19 NOTE — Progress Notes (Signed)
Subjective:    Patient ID: [name], female    DOB: 29-Sep-1960, 54 y.o.   MRN: [id number]  HPI C/o increased mid and low back pain radiating to hi[p, aggravated by movement, denies lower extremity weakness and numbness  Or incontinence. Improved grief and depression over sudden unexpected loss of her Dad . Successfull change i diet with weight loss, activity still limited, interested in bariatric surgery, want information, felt that ins would not cover , but this may not be th case   Review of Systems See HPI Denies recent fever or chills. Denies sinus pressure, nasal congestion, ear pain or sore throat. Denies chest congestion, productive cough or wheezing. Denies chest pains, palpitations and leg swelling Denies abdominal pain, nausea, vomiting,diarrhea or constipation.   Denies dysuria, frequency, hesitancy or incontinence.  Denies headaches, seizures, numbness, or tingling. Denies depression, anxiety or insomnia. Denies skin break down or rash.        Objective:   Physical Exam BP 140/84 mmHg  Pulse 89  Resp 16  Ht 5\' 2"  (1.575 m)  Wt 370 lb 6.4 oz (168.012 kg)  BMI 67.73 kg/m2  SpO2 98% Patient alert and oriented and in no cardiopulmonary distress.Pt in pain  HEENT: No facial asymmetry, EOMI,   oropharynx pink and moist.  Neck supple no JVD, no mass.  Chest: Clear to auscultation bilaterally.  CVS: S1, S2 no murmurs, no S3.Regular rate.  ABD: Soft non tender.   Ext: No edema  MS: decreased  ROM lumbar spine,adequate in shoulders, hips and knees.  Skin: Intact, no ulcerations or rash noted.  Psych: Good eye contact, normal affect. Memory intact not anxious or depressed appearing.  CNS: CN 2-12 intact, power,  normal throughout.no focal deficits noted.        Assessment & Plan:  Thoracic spine pain Uncontrolled.Toradol and depo medrol administered IM in the office , to be followed by a short course of oral prednisone and NSAIDS. 2 weeks,  worse x 4 days after sweeping  Morbid obesity Improved, is looking into surgery Patient re-educated about  the importance of commitment to a  minimum of 150 minutes of exercise per week.  The importance of healthy food choices with portion control discussed. Encouraged to start a food diary, count calories and to consider  joining a support group. Sample diet sheets offered. Goals set by the patient for the next several months.   Weight /BMI 09/06/2014 06/14/2014 02/15/2014  WEIGHT 370 lb 6.4 oz 377 lb 376 lb  HEIGHT 5\' 2"  5\' 2"  5\' 2"   BMI 67.73 kg/m2 68.94 kg/m2 68.75 kg/m2    Current exercise per week 60 minutes.   Prediabetes Patient educated about the importance of limiting  Carbohydrate intake , the need to commit to daily physical activity for a minimum of 30 minutes , and to commit weight loss. The fact that changes in all these areas will reduce or eliminate all together the development of diabetes is stressed.   Diabetic Labs Latest Ref Rng 02/15/2014 07/14/2013 07/13/2013 07/08/2013 07/05/2013  HbA1c <5.7 % 6.3(H) - - 6.2(H) -  Chol 0 - 200 mg/dL 07/16/2013 - - - -  HDL 07/15/2013 mg/dL 43 - - - -  Calc LDL 0 - 99 mg/dL 75 - - - -  Triglycerides <150 mg/dL 43 - - - -  Creatinine 0.50 - 1.10 mg/dL 07/10/2013 07/07/2013 623 - >76   BP/Weight 09/06/2014 06/14/2014 02/15/2014 07/15/2013 07/13/2013 07/08/2013 07/05/2013  Systolic BP 140 140 132 124 -  160 130  Diastolic BP 84 80 82 64 - 100 84  Wt. (Lbs) 370.4 377 376 - 372 376.4 379.41  BMI 67.73 68.94 68.75 - 68.02 68.83 69.38   No flowsheet data found.     Vitamin D deficiency Weekly  supplement to correct and maintain  Migraine headache Improved and less frequent , gabaoentin will assist in prophylaxis  Essential hypertension Uncontrolled at visit but pt in severe pain. No med change with 2 to 3  month re eval DASH diet and commitment to daily physical activity for a minimum of 30 minutes discussed and encouraged, as a part of hypertension  management. The importance of attaining a healthy weight is also discussed.  BP/Weight 09/06/2014 06/14/2014 02/15/2014 07/15/2013 07/13/2013 07/08/2013 07/05/2013  Systolic BP 140 140 132 124 - 818 130  Diastolic BP 84 80 82 64 - 100 84  Wt. (Lbs) 370.4 377 376 - 372 376.4 379.41  BMI 67.73 68.94 68.75 - 68.02 68.83 69.38

## 2014-09-19 NOTE — Assessment & Plan Note (Signed)
Weekly  supplement to correct and maintain

## 2014-09-19 NOTE — Assessment & Plan Note (Signed)
Improved and less frequent , gabaoentin will assist in prophylaxis

## 2014-09-19 NOTE — Assessment & Plan Note (Signed)
Uncontrolled at visit but pt in severe pain. No med change with 2 to 3  month re eval DASH diet and commitment to daily physical activity for a minimum of 30 minutes discussed and encouraged, as a part of hypertension management. The importance of attaining a healthy weight is also discussed.  BP/Weight 09/06/2014 06/14/2014 02/15/2014 07/15/2013 07/13/2013 07/08/2013 07/05/2013  Systolic BP 140 140 132 124 - 462 130  Diastolic BP 84 80 82 64 - 100 84  Wt. (Lbs) 370.4 377 376 - 372 376.4 379.41  BMI 67.73 68.94 68.75 - 68.02 68.83 69.38

## 2014-09-19 NOTE — Assessment & Plan Note (Signed)
Improved, is looking into surgery Patient re-educated about  the importance of commitment to a  minimum of 150 minutes of exercise per week.  The importance of healthy food choices with portion control discussed. Encouraged to start a food diary, count calories and to consider  joining a support group. Sample diet sheets offered. Goals set by the patient for the next several months.   Weight /BMI 09/06/2014 06/14/2014 02/15/2014  WEIGHT 370 lb 6.4 oz 377 lb 376 lb  HEIGHT 5\' 2"  5\' 2"  5\' 2"   BMI 67.73 kg/m2 68.94 kg/m2 68.75 kg/m2    Current exercise per week 60 minutes.

## 2014-11-24 ENCOUNTER — Telehealth: Payer: Self-pay | Admitting: Family Medicine

## 2014-11-24 DIAGNOSIS — B349 Viral infection, unspecified: Secondary | ICD-10-CM | POA: Diagnosis not present

## 2014-11-24 DIAGNOSIS — R079 Chest pain, unspecified: Secondary | ICD-10-CM | POA: Diagnosis not present

## 2014-11-24 NOTE — Telephone Encounter (Signed)
[name] is calling requesting an appointment with Dr. Lodema Hong today, she is c/o chest and back pain x's 1 wk, please advise?

## 2014-11-25 ENCOUNTER — Encounter (HOSPITAL_COMMUNITY): Payer: Self-pay | Admitting: *Deleted

## 2014-11-25 ENCOUNTER — Emergency Department (HOSPITAL_COMMUNITY): Payer: Medicare Other

## 2014-11-25 ENCOUNTER — Emergency Department (HOSPITAL_COMMUNITY)
Admission: EM | Admit: 2014-11-25 | Discharge: 2014-11-25 | Disposition: A | Discharge status: Home / Self Care | Payer: Medicare Other | Attending: Emergency Medicine | Admitting: Emergency Medicine

## 2014-11-25 DIAGNOSIS — Z880 Allergy status to penicillin: Secondary | ICD-10-CM | POA: Diagnosis not present

## 2014-11-25 DIAGNOSIS — G8929 Other chronic pain: Secondary | ICD-10-CM | POA: Diagnosis not present

## 2014-11-25 DIAGNOSIS — Z91040 Latex allergy status: Secondary | ICD-10-CM | POA: Diagnosis not present

## 2014-11-25 DIAGNOSIS — Z79899 Other long term (current) drug therapy: Secondary | ICD-10-CM | POA: Insufficient documentation

## 2014-11-25 DIAGNOSIS — R0789 Other chest pain: Secondary | ICD-10-CM | POA: Diagnosis not present

## 2014-11-25 DIAGNOSIS — R079 Chest pain, unspecified: Secondary | ICD-10-CM | POA: Diagnosis not present

## 2014-11-25 DIAGNOSIS — F329 Major depressive disorder, single episode, unspecified: Secondary | ICD-10-CM | POA: Diagnosis not present

## 2014-11-25 DIAGNOSIS — M1990 Unspecified osteoarthritis, unspecified site: Secondary | ICD-10-CM | POA: Diagnosis not present

## 2014-11-25 DIAGNOSIS — R0602 Shortness of breath: Secondary | ICD-10-CM | POA: Diagnosis not present

## 2014-11-25 DIAGNOSIS — R61 Generalized hyperhidrosis: Secondary | ICD-10-CM | POA: Diagnosis not present

## 2014-11-25 DIAGNOSIS — R109 Unspecified abdominal pain: Secondary | ICD-10-CM

## 2014-11-25 LAB — URINALYSIS, ROUTINE W REFLEX MICROSCOPIC
Bilirubin Urine: NEGATIVE
GLUCOSE, UA: NEGATIVE mg/dL
Ketones, ur: NEGATIVE mg/dL
Leukocytes, UA: NEGATIVE
Nitrite: NEGATIVE
Protein, ur: NEGATIVE mg/dL
SPECIFIC GRAVITY, URINE: 1.02 (ref 1.005–1.030)
Urobilinogen, UA: 0.2 mg/dL (ref 0.0–1.0)
pH: 5.5 (ref 5.0–8.0)

## 2014-11-25 LAB — CBC WITH DIFFERENTIAL/PLATELET
Basophils Absolute: 0 10*3/uL (ref 0.0–0.1)
Basophils Relative: 0 % (ref 0–1)
Eosinophils Absolute: 0.1 10*3/uL (ref 0.0–0.7)
Eosinophils Relative: 1 % (ref 0–5)
HEMATOCRIT: 38.6 % (ref 36.0–46.0)
Hemoglobin: 11.6 g/dL — ABNORMAL LOW (ref 12.0–15.0)
LYMPHS ABS: 2.8 10*3/uL (ref 0.7–4.0)
LYMPHS PCT: 36 % (ref 12–46)
MCH: 23.2 pg — AB (ref 26.0–34.0)
MCHC: 30.1 g/dL (ref 30.0–36.0)
MCV: 77.2 fL — AB (ref 78.0–100.0)
MONO ABS: 0.7 10*3/uL (ref 0.1–1.0)
MONOS PCT: 9 % (ref 3–12)
NEUTROS ABS: 4.1 10*3/uL (ref 1.7–7.7)
Neutrophils Relative %: 54 % (ref 43–77)
Platelets: 361 10*3/uL (ref 150–400)
RBC: 5 MIL/uL (ref 3.87–5.11)
RDW: 16.4 % — AB (ref 11.5–15.5)
WBC: 7.7 10*3/uL (ref 4.0–10.5)

## 2014-11-25 LAB — URINE MICROSCOPIC-ADD ON

## 2014-11-25 LAB — BASIC METABOLIC PANEL
Anion gap: 6 (ref 5–15)
BUN: 14 mg/dL (ref 6–20)
CHLORIDE: 105 mmol/L (ref 101–111)
CO2: 25 mmol/L (ref 22–32)
Calcium: 8.3 mg/dL — ABNORMAL LOW (ref 8.9–10.3)
Creatinine, Ser: 0.67 mg/dL (ref 0.44–1.00)
GFR calc Af Amer: >60 mL/min (ref >60)
GLUCOSE: 108 mg/dL — AB (ref 65–99)
POTASSIUM: 4.3 mmol/L (ref 3.5–5.1)
Sodium: 136 mmol/L (ref 135–145)

## 2014-11-25 LAB — D-DIMER, QUANTITATIVE: D-Dimer, Quant: 0.96 ug/mL-FEU — ABNORMAL HIGH (ref 0.00–0.48)

## 2014-11-25 LAB — TROPONIN I: Troponin I: <0.03 ng/mL (ref <0.031)

## 2014-11-25 MED ORDER — ASPIRIN 81 MG PO CHEW
324.0000 mg | CHEWABLE_TABLET | Freq: Once | ORAL | Status: AC
  Administered 2014-11-25: 324 mg via ORAL
  Filled 2014-11-25: qty 4

## 2014-11-25 MED ORDER — IOHEXOL 350 MG/ML SOLN
125.0000 mL | Freq: Once | INTRAVENOUS | Status: AC | PRN
Start: 2014-11-25 — End: 2014-11-25
  Administered 2014-11-25: 125 mL via INTRAVENOUS

## 2014-11-25 MED ORDER — MORPHINE SULFATE (PF) 4 MG/ML IV SOLN
4.0000 mg | Freq: Once | INTRAVENOUS | Status: AC
  Administered 2014-11-25: 4 mg via INTRAVENOUS
  Filled 2014-11-25: qty 1

## 2014-11-25 NOTE — Telephone Encounter (Signed)
Spoke with daughter.  Patient was seen at urgent care on 9/8.  She also went to the ED on 9/9 with negative findings.  Given follow up appointment for 9/12 @ 1:15.  Do you agree?

## 2014-11-25 NOTE — ED Notes (Signed)
Patient ambulatory to restroom. Steady gait.  

## 2014-11-25 NOTE — ED Provider Notes (Signed)
CSN: [id number]     Arrival date & time 11/25/14  0447 History   First MD Initiated Contact with Patient 11/25/14 [phone]     Chief Complaint  Patient presents with  . Chest Pain     (Consider location/radiation/quality/duration/timing/severity/associated sxs/prior Treatment) Patient is a 54 y.o. female presenting with chest pain. The history is provided by the patient.  Chest Pain She has been having midsternal chest pain for about a week. Pain pain is intermittent and comes and goes without any particular pattern. It is not exertional and not worse with deep breath. Yesterday, she developed pain in the left lateral chest which was distinct and different from many other chest pain. Left lateral chest pain is sharp and worse with deep breaths. The anterior pain is just dull and throbbing. She rates pain at 8/10. She denies dyspnea. There's been nausea but no vomiting. She has had some diaphoresis. There is no cough or fever or chills. She denies arthralgias or myalgias. She has not taken anything at home for pain. She did go to an urgent care center where she was told that she had a mild heart attack and was to follow-up with her PCP. Cardiac risk factors are significant only for morbid obesity. She denies history of hypertension, diabetes, hyperlipidemia and is a nonsmoker. Both parents had heart disease with onset in their 3s and 73s.  Past Medical History  Diagnosis Date  . Morbid obesity   . Degenerative joint disease     Left TKA in 07/2009-Dr. Landau  . Chronic back pain   . Syncope     with palpitations; evaluated by [facility] in 2008 with normal echo  . Headache(784.0)     occasionally  . Joint pain   . Joint swelling   . Peripheral edema     to right leg;takes Furosemide occasionally and hasnt had one in a month  . Nocturia   . Hypothyroidism     was on Levothyroxine-has been off x 4 months  . Depression 2001    doesn't take any meds  . Osteoarthritis of  right knee 07/13/2013   Past Surgical History  Procedure Laterality Date  . Abdominal hysterectomy  1995     no oophorectomy  . Cholecystectomy, laparoscopic  2007    Dr. Lovell Sheehan  . Thyroidectomy, partial  2004    Left lobectomy - large benign nodule  . Knee arthroscopy  1995    Dr. Cleophas Dunker   . Total knee arthroplasty  07/2009    Left knee , Dr. Dion Saucier   . Carpal tunnel release  1991    Bilateral  . Craniectomy suboccipital w/ cervical laminectomy / chiari  2007    Repair of Arnold-Chiari malformation  . Tonsillectomy    . Cesarean section      X2  . Cholecystectomy    . Joint replacement Left 2011  . Colonoscopy N/A 03/03/2013    Procedure: COLONOSCOPY;  Surgeon: Malissa Hippo, MD;  Location: [facility];  Service: Endoscopy;  Laterality: N/A;  930  . Total knee arthroplasty Right 07/13/2013    Procedure: TOTAL KNEE ARTHROPLASTY;  Surgeon: Eulas Post, MD;  Location: [facility] OR;  Service: Orthopedics;  Laterality: Right;   Family History  Problem Relation Age of Onset  . Diabetes Mother   . Diabetes Father     emphsema   . Hyperlipidemia Father   . Colon cancer Neg Hx    Social History  Substance Use Topics  .  Smoking status: Never Smoker   . Smokeless tobacco: None  . Alcohol Use: No   OB History    No data available     Review of Systems  Cardiovascular: Positive for chest pain.  All other systems reviewed and are negative.     Allergies  Aleve; Penicillins; Adhesive; and Latex  Home Medications   Prior to Admission medications   Medication Sig Start Date End Date Taking? Authorizing Provider  acetaminophen (TYLENOL) 500 MG tablet One twice daily 09/06/14   Kerri Perches, MD  ALPRAZolam Prudy Feeler) 0.25 MG tablet Take 1 tablet (0.25 mg total) by mouth at bedtime as needed for anxiety. 06/14/14   Kerri Perches, MD  gabapentin (NEURONTIN) 300 MG capsule Take 1 capsule (300 mg total) by mouth at bedtime. 09/06/14   Kerri Perches, MD  ibuprofen  (ADVIL,MOTRIN) 800 MG tablet Take 1 tablet (800 mg total) by mouth every 8 (eight) hours as needed. 09/06/14   Kerri Perches, MD  losartan (COZAAR) 50 MG tablet Take 1 tablet (50 mg total) by mouth daily. 06/14/14   Kerri Perches, MD  Phentermine HCl 37.5 MG TBDP One table daily before breakfast 09/06/14   Kerri Perches, MD  predniSONE (STERAPRED UNI-PAK 21 TAB) 5 MG (21) TBPK tablet Take 1 tablet (5 mg total) by mouth as directed. Use as directed 09/06/14   Kerri Perches, MD  Vitamin D, Ergocalciferol, (DRISDOL) 50000 UNITS CAPS capsule Take 1 capsule (50,000 Units total) by mouth every 7 (seven) days. 02/21/14   Kerri Perches, MD   BP 146/98 mmHg  Pulse 72  Temp(Src) 98.5 F (36.9 C) (Oral)  Resp 15  Ht 5\' 2"  (1.575 m)  Wt 372 lb (168.738 kg)  BMI 68.02 kg/m2  SpO2 99% Physical Exam  Nursing note and vitals reviewed.  Morbidly obese 54 year old female, resting comfortably and in no acute distress. Vital signs are significant for hypertension. Oxygen saturation is 99%, which is normal. Head is normocephalic and atraumatic. PERRLA, EOMI. Oropharynx is clear. Neck is nontender and supple without adenopathy or JVD. Back is nontender and there is no CVA tenderness. Lungs are clear without rales, wheezes, or rhonchi. Chest is moderately tender in the left lateral and posterior lateral rib cage with no anterior tenderness. Heart has regular rate and rhythm without murmur. Abdomen is soft, flat, nontender without masses or hepatosplenomegaly and peristalsis is normoactive. Extremities have no cyanosis or edema, full range of motion is present. Skin is warm and dry without rash. Neurologic: Mental status is normal, cranial nerves are intact, there are no motor or sensory deficits.  ED Course  Procedures (including critical care time) Labs Review Results for orders placed or performed during the hospital encounter of 11/25/14  Basic metabolic panel  Result Value Ref  Range   Sodium 136 135 - 145 mmol/L   Potassium 4.3 3.5 - 5.1 mmol/L   Chloride 105 101 - 111 mmol/L   CO2 25 22 - 32 mmol/L   Glucose, Bld 108 (H) 65 - 99 mg/dL   BUN 14 6 - 20 mg/dL   Creatinine, Ser 4.19 0.44 - 1.00 mg/dL   Calcium 8.3 (L) 8.9 - 10.3 mg/dL   GFR calc non Af Amer >60 >60 mL/min   GFR calc Af Amer >60 >60 mL/min   Anion gap 6 5 - 15  D-dimer, quantitative  Result Value Ref Range   D-Dimer, Quant 0.96 (H) 0.00 - 0.48 ug/mL-FEU  CBC with  Differential  Result Value Ref Range   WBC 7.7 4.0 - 10.5 K/uL   RBC 5.00 3.87 - 5.11 MIL/uL   Hemoglobin 11.6 (L) 12.0 - 15.0 g/dL   HCT 59.9 35.7 - 01.7 %   MCV 77.2 (L) 78.0 - 100.0 fL   MCH 23.2 (L) 26.0 - 34.0 pg   MCHC 30.1 30.0 - 36.0 g/dL   RDW 79.3 (H) 90.3 - 00.9 %   Platelets 361 150 - 400 K/uL   Neutrophils Relative % 54 43 - 77 %   Neutro Abs 4.1 1.7 - 7.7 K/uL   Lymphocytes Relative 36 12 - 46 %   Lymphs Abs 2.8 0.7 - 4.0 K/uL   Monocytes Relative 9 3 - 12 %   Monocytes Absolute 0.7 0.1 - 1.0 K/uL   Eosinophils Relative 1 0 - 5 %   Eosinophils Absolute 0.1 0.0 - 0.7 K/uL   Basophils Relative 0 0 - 1 %   Basophils Absolute 0.0 0.0 - 0.1 K/uL  Troponin I  Result Value Ref Range   Troponin I <0.03 <0.031 ng/mL   Imaging Review Dg Chest 2 View  11/25/2014   CLINICAL DATA:  Chest pain and shortness of breath this morning  EXAM: CHEST  2 VIEW  COMPARISON:  12/18/2012  FINDINGS: Shallow inspiration. Normal heart size and pulmonary vascularity. No focal airspace disease or consolidation in the lungs. No blunting of costophrenic angles. No pneumothorax. Mediastinal contours appear intact. Degenerative changes in the spine. Postoperative changes in the cervical spine.  IMPRESSION: No active cardiopulmonary disease.   Electronically Signed   By: Burman Nieves M.D.   On: 11/25/2014 05:48   Ct Angio Chest Pe W/cm &/or Wo Cm  11/25/2014   CLINICAL DATA:  Chest pain and shortness of breath this morning.  EXAM: CT  ANGIOGRAPHY CHEST WITH CONTRAST  TECHNIQUE: Multidetector CT imaging of the chest was performed using the standard protocol during bolus administration of intravenous contrast. Multiplanar CT image reconstructions and MIPs were obtained to evaluate the vascular anatomy.  CONTRAST:  125 mL OMNIPAQUE IOHEXOL 350 MG/ML SOLN  COMPARISON:  CT chest 12/18/2012 and PA and lateral chest earlier today.  FINDINGS: No pulmonary embolus is identified. Heart size is normal. No pleural or pericardial effusion. No axillary, hilar or mediastinal lymphadenopathy. The lungs are clear. Visualized upper abdomen is unremarkable. No focal bony abnormality is identified. Mild anterior endplate spurring in the thoracic spine is noted.  Review of the MIP images confirms the above findings.  IMPRESSION: Negative for pulmonary embolus.  Negative exam.   Electronically Signed   By: Drusilla Kanner M.D.   On: 11/25/2014 07:12   I have personally reviewed and evaluated these images and lab results as part of my medical decision-making.   EKG Interpretation   Date/Time:  Friday November 25 2014 05:00:12 EDT Ventricular Rate:  64 PR Interval:  172 QRS Duration: 81 QT Interval:  376 QTC Calculation: 388 R Axis:   48 Text Interpretation:  Sinus rhythm Atrial premature complex Abnormal  R-wave progression, early transition When compared with ECG of 07/05/2013,  ABNORMAL R WAVE PROGRESSION ,  Early transition is now Present Change may  be related to chest lead position Confirmed by [facility]  MD, Makoto Sellitto ([id number]) on  11/25/2014 5:05:38 AM      MDM   Final diagnoses:  Left flank pain  Atypical chest pain    2 different types of chest pain. Cause is not clear. Patient has minimal cardiac risk  factors and heart score is 2 which places her at very low risk. With pleuritic pain present, will screen for possible pulmonary embolism with d-dimer. Chest x-ray is obtained as well as screening labs. She has an allergy to naproxen, so she  will not be given ketorolac. She states she is able to take aspirin so she is given a dose of aspirin. Old records are reviewed and she has no visits for any heart issues. She did have an outpatient cardiology consultation with negative findings.  Troponin is negative and chest x-ray is normal. D-dimer is come back slightly elevated and she will be sent for CT angiogram. She has had significant relief of pain with morphine.  CT angiogram is normal. I explained findings to the patient and she then expressed concern about possible kidney stone. She states the pain in her left side seems to be moving further down. On reexam, she does have some tenderness in the left costovertebral angle area. I reviewed her CT angiogram and there is no visualization whatsoever of her kidneys. She will be sent for CT renal stone study. Case is signed out to Dr. Aileen Pilot.  Dione Booze, MD 11/25/14 [phone]

## 2014-11-25 NOTE — Discharge Instructions (Signed)
Take motrin or naprosyn for pain and follow up with your md next week

## 2014-11-25 NOTE — ED Notes (Signed)
Patient with no complaints at this time. Respirations even and unlabored. Skin warm/dry. Discharge instructions reviewed with patient at this time. Patient given opportunity to voice concerns/ask questions. IV removed per policy and band-aid applied to site. Patient discharged at this time and left Emergency Department with steady gait.  

## 2014-11-25 NOTE — Telephone Encounter (Signed)
Yes if pain worsens over the weekend needs to return to ED,

## 2014-11-25 NOTE — ED Notes (Signed)
Pt reporting CP for aprox a week.  States pain is more on left side of chest, and into left side.  Pt reporting some nausea, and fever.  Denies vomiting.

## 2014-11-25 NOTE — Telephone Encounter (Signed)
Noted  

## 2014-11-28 ENCOUNTER — Encounter: Payer: Self-pay | Admitting: Family Medicine

## 2014-11-28 ENCOUNTER — Ambulatory Visit (INDEPENDENT_AMBULATORY_CARE_PROVIDER_SITE_OTHER): Payer: Medicare Other | Admitting: Family Medicine

## 2014-11-28 VITALS — BP 150/90 | HR 68 | Resp 18 | Ht 62.0 in | Wt 377.0 lb

## 2014-11-28 DIAGNOSIS — F32A Depression, unspecified: Secondary | ICD-10-CM

## 2014-11-28 DIAGNOSIS — R7309 Other abnormal glucose: Secondary | ICD-10-CM

## 2014-11-28 DIAGNOSIS — F329 Major depressive disorder, single episode, unspecified: Secondary | ICD-10-CM | POA: Diagnosis not present

## 2014-11-28 DIAGNOSIS — Z23 Encounter for immunization: Secondary | ICD-10-CM

## 2014-11-28 DIAGNOSIS — R7303 Prediabetes: Secondary | ICD-10-CM

## 2014-11-28 DIAGNOSIS — Z114 Encounter for screening for human immunodeficiency virus [HIV]: Secondary | ICD-10-CM

## 2014-11-28 DIAGNOSIS — I10 Essential (primary) hypertension: Secondary | ICD-10-CM | POA: Diagnosis not present

## 2014-11-28 DIAGNOSIS — Z1159 Encounter for screening for other viral diseases: Secondary | ICD-10-CM

## 2014-11-28 DIAGNOSIS — M546 Pain in thoracic spine: Secondary | ICD-10-CM

## 2014-11-28 MED ORDER — TEMAZEPAM 30 MG PO CAPS: 30.0000 mg | ORAL_CAPSULE | Freq: Every evening | ORAL | Status: DC | PRN

## 2014-11-28 MED ORDER — LOSARTAN POTASSIUM 100 MG PO TABS: 100.0000 mg | ORAL_TABLET | Freq: Every day | ORAL | Status: DC

## 2014-11-28 MED ORDER — FLUOXETINE HCL (PMDD) 20 MG PO CAPS: 1.0000 | ORAL_CAPSULE | Freq: Every day | ORAL | Status: DC

## 2014-11-28 NOTE — Assessment & Plan Note (Signed)
Uncontrolled, inc cozaar to 100 mg  DASH diet and commitment to daily physical activity for a minimum of 30 minutes discussed and encouraged, as a part of hypertension management. The importance of attaining a healthy weight is also discussed.  BP/Weight 11/28/2014 11/25/2014 09/06/2014 06/14/2014 02/15/2014 07/15/2013 07/13/2013  Systolic BP 150 129 140 140 132 124 -  Diastolic BP 90 92 84 80 82 64 -  Wt. (Lbs) 377 372 370.4 377 376 - 372  BMI 68.94 68.02 67.73 68.94 68.75 - 68.02       Nurse f/u in 5 weeks

## 2014-11-28 NOTE — Progress Notes (Signed)
Subjective:    Patient ID: [name], female    DOB: 1960/08/17, 54 y.o.   MRN: [id number]  HPI The PT is here for follow up and re-evaluation of chronic medical conditions, medication management and review of any available recent lab and radiology data.  Preventive health is updated, specifically  Cancer screening and Immunization.   Questions or concerns regarding consultations or procedures which the PT has had in the interim are  Addressed.Recently seen in E for chest pain, negative cardiac work up, paiun originating from spine, feels better The PT denies any adverse reactions to current medications since the last visit.  There are no new concerns.         Review of Systems  See HPI Denies recent fever or chills. Denies sinus pressure, nasal congestion, ear pain or sore throat. Denies chest congestion, productive cough or wheezing. Denies chest pains, palpitations and leg swelling Denies abdominal pain, nausea, vomiting,diarrhea or constipation.   Denies dysuria, frequency, hesitancy or incontinence.  Denies skin break down or rash.      Objective:   Physical Exam  BP 150/90 mmHg  Pulse 68  Resp 18  Ht 5\' 2"  (1.575 m)  Wt 377 lb (171.006 kg)  BMI 68.94 kg/m2  SpO2 97% Patient alert and oriented and in no cardiopulmonary distress.  HEENT: No facial asymmetry, EOMI,   oropharynx pink and moist.  Neck supple no JVD, no mass.  Chest: Clear to auscultation bilaterally.  CVS: S1, S2 no murmurs, no S3.Regular rate.  ABD: Soft non tender.   Ext: No edema   ROM spine, shoulders, hips and knees.  Skin: Intact, no ulcerations or rash noted.  Psych: Good eye contact, tearful  affect. Memory intact  anxious and  depressed appearing.  CNS: CN 2-12 intact, power,  normal throughout.no focal deficits noted.       Assessment & Plan:  Essential hypertension Uncontrolled, inc cozaar to 100 mg  DASH diet and commitment to daily physical  activity for a minimum of 30 minutes discussed and encouraged, as a part of hypertension management. The importance of attaining a healthy weight is also discussed.  BP/Weight 11/28/2014 11/25/2014 09/06/2014 06/14/2014 02/15/2014 07/15/2013 07/13/2013  Systolic BP 150 129 140 140 132 124 -  Diastolic BP 90 92 84 80 82 64 -  Wt. (Lbs) 377 372 370.4 377 376 - 372  BMI 68.94 68.02 67.73 68.94 68.75 - 68.02       Nurse f/u in 5 weeks  Depression Uncontrolled though not suicidal or homicidal, increase fluoxetine dose, states unable to afford therapy though she knows that she would benefit  Morbid obesity Deteriorated. Patient re-educated about  the importance of commitment to a  minimum of 150 minutes of exercise per week.  The importance of healthy food choices with portion control discussed. Encouraged to start a food diary, count calories and to consider  joining a support group. Sample diet sheets offered. Goals set by the patient for the next several months.   Weight /BMI 11/28/2014 11/25/2014 09/06/2014  WEIGHT 377 lb 372 lb 370 lb 6.4 oz  HEIGHT 5\' 2"  5\' 2"  5\' 2"   BMI 68.94 kg/m2 68.02 kg/m2 67.73 kg/m2    Current exercise per week 60 minutes.   Prediabetes Patient educated about the importance of limiting  Carbohydrate intake , the need to commit to daily physical activity for a minimum of 30 minutes , and to commit weight loss. The fact that changes in all these areas  will reduce or eliminate all together the development of diabetes is stressed.  Deteriorated, will start metformin  Diabetic Labs Latest Ref Rng 11/25/2014 02/15/2014 07/14/2013 07/13/2013 07/08/2013  HbA1c 4.8 - 5.6 % 6.6(H) 6.3(H) - - 6.2(H)  Chol 0 - 200 mg/dL - 672 - - -  HDL >09 mg/dL - 43 - - -  Calc LDL 0 - 99 mg/dL - 75 - - -  Triglycerides <150 mg/dL - 43 - - -  Creatinine 0.44 - 1.00 mg/dL 4.70 9.62 8.36 6.29 -   BP/Weight 11/28/2014 11/25/2014 09/06/2014 06/14/2014 02/15/2014 07/15/2013 07/13/2013  Systolic BP 150  476 140 140 132 124 -  Diastolic BP 90 92 84 80 82 64 -  Wt. (Lbs) 377 372 370.4 377 376 - 372  BMI 68.94 68.02 67.73 68.94 68.75 - 68.02   No flowsheet data found.     Thoracic spine pain Improved following recent treatment in Ed

## 2014-11-28 NOTE — Patient Instructions (Signed)
F/u with rectal in January, call if you need me before  Nurse BP check 3rd week in October  Increase cozaar to 100 mg once daily, BP is  HIGH, do nOT fill phentermine BP is too high  Mammogram due in Nov, we will order  Medication for depression and poor sleep sent in  Flu vfaccine today  Pain from back , arthritis and bulging discs, thankful you are better  Additional labs needed, nurse will let you know if need a re draw and when  [facility] [place] you feel better sonn  Thanks for choosing [facility], we consider it a privelige to serve you.

## 2014-11-29 ENCOUNTER — Other Ambulatory Visit (HOSPITAL_COMMUNITY)
Admission: RE | Admit: 2014-11-29 | Discharge: 2014-11-29 | Disposition: A | Discharge status: Home / Self Care | Payer: Medicare Other | Source: Other Acute Inpatient Hospital | Attending: Family Medicine | Admitting: Family Medicine

## 2014-11-29 DIAGNOSIS — Z1159 Encounter for screening for other viral diseases: Secondary | ICD-10-CM | POA: Diagnosis not present

## 2014-11-30 ENCOUNTER — Other Ambulatory Visit: Payer: Self-pay

## 2014-11-30 DIAGNOSIS — R7303 Prediabetes: Secondary | ICD-10-CM

## 2014-11-30 LAB — HEMOGLOBIN A1C
Hgb A1c MFr Bld: 6.6 % — ABNORMAL HIGH (ref 4.8–5.6)
Mean Plasma Glucose: 143 mg/dL

## 2014-11-30 LAB — HEPATITIS C ANTIBODY

## 2014-11-30 LAB — HIV ANTIBODY (ROUTINE TESTING W REFLEX): HIV Screen 4th Generation wRfx: NONREACTIVE

## 2014-11-30 MED ORDER — METFORMIN HCL 500 MG PO TABS: 500.0000 mg | ORAL_TABLET | Freq: Two times a day (BID) | ORAL | Status: DC

## 2014-12-22 NOTE — Assessment & Plan Note (Signed)
Deteriorated. Patient re-educated about  the importance of commitment to a  minimum of 150 minutes of exercise per week.  The importance of healthy food choices with portion control discussed. Encouraged to start a food diary, count calories and to consider  joining a support group. Sample diet sheets offered. Goals set by the patient for the next several months.   Weight /BMI 11/28/2014 11/25/2014 09/06/2014  WEIGHT 377 lb 372 lb 370 lb 6.4 oz  HEIGHT 5\' 2"  5\' 2"  5\' 2"   BMI 68.94 kg/m2 68.02 kg/m2 67.73 kg/m2    Current exercise per week 60 minutes.

## 2014-12-22 NOTE — Assessment & Plan Note (Signed)
Improved following recent treatment in Ed

## 2014-12-22 NOTE — Assessment & Plan Note (Signed)
Uncontrolled though not suicidal or homicidal, increase fluoxetine dose, states unable to afford therapy though she knows that she would benefit

## 2014-12-22 NOTE — Assessment & Plan Note (Signed)
Patient educated about the importance of limiting  Carbohydrate intake , the need to commit to daily physical activity for a minimum of 30 minutes , and to commit weight loss. The fact that changes in all these areas will reduce or eliminate all together the development of diabetes is stressed.  Deteriorated, will start metformin  Diabetic Labs Latest Ref Rng 11/25/2014 02/15/2014 07/14/2013 07/13/2013 07/08/2013  HbA1c 4.8 - 5.6 % 6.6(H) 6.3(H) - - 6.2(H)  Chol 0 - 200 mg/dL - 381 - - -  HDL >82 mg/dL - 43 - - -  Calc LDL 0 - 99 mg/dL - 75 - - -  Triglycerides <150 mg/dL - 43 - - -  Creatinine 0.44 - 1.00 mg/dL 9.93 7.16 9.67 8.93 -   BP/Weight 11/28/2014 11/25/2014 09/06/2014 06/14/2014 02/15/2014 07/15/2013 07/13/2013  Systolic BP 150 129 140 140 132 124 -  Diastolic BP 90 92 84 80 82 64 -  Wt. (Lbs) 377 372 370.4 377 376 - 372  BMI 68.94 68.02 67.73 68.94 68.75 - 68.02   No flowsheet data found.

## 2015-01-03 ENCOUNTER — Other Ambulatory Visit: Payer: Self-pay | Admitting: Family Medicine

## 2015-01-04 ENCOUNTER — Ambulatory Visit (INDEPENDENT_AMBULATORY_CARE_PROVIDER_SITE_OTHER): Payer: Medicare Other

## 2015-01-04 DIAGNOSIS — I10 Essential (primary) hypertension: Secondary | ICD-10-CM

## 2015-01-04 NOTE — Progress Notes (Signed)
BP was elevated at her last visit and she came in today for a BP check. BP was 130/84 and pt reported compliance with her medications. Will call back if she has any questions.

## 2015-02-13 ENCOUNTER — Other Ambulatory Visit: Payer: Self-pay | Admitting: Family Medicine

## 2015-02-15 ENCOUNTER — Other Ambulatory Visit: Payer: Self-pay

## 2015-02-15 MED ORDER — IBUPROFEN 800 MG PO TABS: ORAL_TABLET | ORAL | Status: DC

## 2015-02-16 ENCOUNTER — Encounter: Payer: Self-pay | Admitting: Family Medicine

## 2015-03-01 DIAGNOSIS — N3090 Cystitis, unspecified without hematuria: Secondary | ICD-10-CM | POA: Diagnosis not present

## 2015-03-01 DIAGNOSIS — R109 Unspecified abdominal pain: Secondary | ICD-10-CM | POA: Diagnosis not present

## 2015-04-06 ENCOUNTER — Other Ambulatory Visit (HOSPITAL_COMMUNITY)
Admission: RE | Admit: 2015-04-06 | Discharge: 2015-04-06 | Disposition: A | Discharge status: Home / Self Care | Payer: Medicare Other | Source: Ambulatory Visit | Attending: Family Medicine | Admitting: Family Medicine

## 2015-04-06 ENCOUNTER — Encounter: Payer: Self-pay | Admitting: Family Medicine

## 2015-04-06 ENCOUNTER — Ambulatory Visit (INDEPENDENT_AMBULATORY_CARE_PROVIDER_SITE_OTHER): Payer: Medicare Other | Admitting: Family Medicine

## 2015-04-06 VITALS — BP 124/84 | HR 83 | Resp 16 | Ht 62.0 in | Wt 372.0 lb

## 2015-04-06 DIAGNOSIS — R7303 Prediabetes: Secondary | ICD-10-CM | POA: Diagnosis not present

## 2015-04-06 DIAGNOSIS — Z1211 Encounter for screening for malignant neoplasm of colon: Secondary | ICD-10-CM

## 2015-04-06 DIAGNOSIS — Z01419 Encounter for gynecological examination (general) (routine) without abnormal findings: Secondary | ICD-10-CM

## 2015-04-06 DIAGNOSIS — Z124 Encounter for screening for malignant neoplasm of cervix: Secondary | ICD-10-CM

## 2015-04-06 DIAGNOSIS — Z1151 Encounter for screening for human papillomavirus (HPV): Secondary | ICD-10-CM | POA: Diagnosis not present

## 2015-04-06 DIAGNOSIS — F329 Major depressive disorder, single episode, unspecified: Secondary | ICD-10-CM

## 2015-04-06 DIAGNOSIS — F32A Depression, unspecified: Secondary | ICD-10-CM

## 2015-04-06 DIAGNOSIS — Z13220 Encounter for screening for lipoid disorders: Secondary | ICD-10-CM | POA: Diagnosis not present

## 2015-04-06 DIAGNOSIS — Z0000 Encounter for general adult medical examination without abnormal findings: Secondary | ICD-10-CM

## 2015-04-06 DIAGNOSIS — I10 Essential (primary) hypertension: Secondary | ICD-10-CM | POA: Diagnosis not present

## 2015-04-06 LAB — POC HEMOCCULT BLD/STL (OFFICE/1-CARD/DIAGNOSTIC): Fecal Occult Blood, POC: NEGATIVE

## 2015-04-06 MED ORDER — TEMAZEPAM 30 MG PO CAPS: 30.0000 mg | ORAL_CAPSULE | Freq: Every evening | ORAL | Status: DC | PRN

## 2015-04-06 MED ORDER — METFORMIN HCL 500 MG PO TABS: 500.0000 mg | ORAL_TABLET | Freq: Two times a day (BID) | ORAL | Status: DC

## 2015-04-06 MED ORDER — IBUPROFEN 800 MG PO TABS: ORAL_TABLET | ORAL | Status: DC

## 2015-04-06 MED ORDER — FLUOXETINE HCL (PMDD) 20 MG PO CAPS: 1.0000 | ORAL_CAPSULE | Freq: Every day | ORAL | Status: DC

## 2015-04-06 MED ORDER — LOSARTAN POTASSIUM 100 MG PO TABS: 100.0000 mg | ORAL_TABLET | Freq: Every day | ORAL | Status: DC

## 2015-04-06 NOTE — Progress Notes (Signed)
   Subjective:    Patient ID: [name], female    DOB: July 02, 1960, 55 y.o.   MRN: [id number]  HPI Patient is in for annual physical exam. No other health concerns are expressed or addressed at the visit. Recent labs, if available are reviewed. Immunization is reviewed , and  updated if needed.    Review of Systems See HPI     Objective:   Physical Exam  BP 124/84 mmHg  Pulse 83  Resp 16  Ht 5\' 2"  (1.575 m)  Wt 372 lb (168.738 kg)  BMI 68.02 kg/m2  SpO2 96%  Pleasant  female, alert and oriented x 3, in no cardio-pulmonary distress. Afebrile. HEENT No facial trauma or asymetry. Sinuses non tender.  Extra occullar muscles intact, pupils equally reactive to light. External ears normal, tympanic membranes clear. Oropharynx moist, no exudate,fairly good dentition. Neck: supple, no adenopathy,JVD or thyromegaly.No bruits.  Chest: Clear to ascultation bilaterally.No crackles or wheezes. Non tender to palpation  Breast: No asymetry,no masses or lumps. No tenderness. No nipple discharge or inversion. No axillary or supraclavicular adenopathy  Cardiovascular system; Heart sounds normal,  S1 and  S2 ,no S3.  No murmur, or thrill. Apical beat not displaced Peripheral pulses normal.  Abdomen: Soft, non tender, no organomegaly or masses. No bruits. Bowel sounds normal. No guarding, tenderness or rebound.  Rectal:  Normal sphincter tone. No mass.No rectal masses.  Guaiac negative stool.  GU: External genitalia normal female genitalia , female distribution of hair. No lesions. Urethral meatus normal in size, no  Prolapse, no lesions visibly  Present. Bladder non tender. Vagina pink and moist , with no visible lesions , discharge present . Adequate pelvic support no  cystocele or rectocele noted  Uterus absent, no adnexal masses, no  adnexal tenderness.   Musculoskeletal exam: Adequate though reduced  ROM of spine, hips , shoulders and knees. No  deformity ,swelling or crepitus noted. No muscle wasting or atrophy.   Neurologic: Cranial nerves 2 to 12 intact. Power, tone ,sensation and reflexes normal throughout. No disturbance in gait. No tremor.  Skin: Intact, no ulceration, erythema , scaling or rash noted. Pigmentation normal throughout  Psych; Normal mood and affect. Judgement and concentration normal       Assessment & Plan:  Annual physical exam Annual exam as documented. Counseling done  re healthy lifestyle involving commitment to 150 minutes exercise per week, heart healthy diet, and attaining healthy weight.The importance of adequate sleep also discussed. Regular seat belt use and home safety, is also discussed. Changes in health habits are decided on by the patient with goals and time frames  set for achieving them. Immunization and cancer screening needs are specifically addressed at this visit.

## 2015-04-06 NOTE — Patient Instructions (Addendum)
F/U in 4 month, call if you need me sooner  Fasting lipid, chem 7 , HBA1C and TSH in 4 month  Work on healthy diet  Fluoxetine , restoril , metformin , cozaar refilld for 5 months , ibuprofen for 1 time only  Thanks for choosing [facility], we consider it a privelige to serve you.  All the best for 2017!

## 2015-04-09 DIAGNOSIS — Z0000 Encounter for general adult medical examination without abnormal findings: Secondary | ICD-10-CM | POA: Insufficient documentation

## 2015-04-09 NOTE — Assessment & Plan Note (Signed)

## 2015-04-10 LAB — CYTOLOGY - PAP

## 2015-04-26 ENCOUNTER — Ambulatory Visit (INDEPENDENT_AMBULATORY_CARE_PROVIDER_SITE_OTHER): Payer: Medicare Other | Admitting: Physician Assistant

## 2015-04-26 ENCOUNTER — Encounter: Payer: Self-pay | Admitting: Physician Assistant

## 2015-04-26 VITALS — BP 130/80 | HR 61 | Ht 62.0 in | Wt 378.0 lb

## 2015-04-26 DIAGNOSIS — R55 Syncope and collapse: Secondary | ICD-10-CM | POA: Diagnosis not present

## 2015-04-26 DIAGNOSIS — R0600 Dyspnea, unspecified: Secondary | ICD-10-CM | POA: Insufficient documentation

## 2015-04-26 DIAGNOSIS — R0609 Other forms of dyspnea: Secondary | ICD-10-CM | POA: Diagnosis not present

## 2015-04-26 DIAGNOSIS — R002 Palpitations: Secondary | ICD-10-CM

## 2015-04-26 NOTE — Assessment & Plan Note (Addendum)
Weight loss is imperative. Discussed exercise and weight loss program. Will need fasting lipid panel in the future but she is not fasting today.

## 2015-04-26 NOTE — Assessment & Plan Note (Signed)
Patient says she passed out after an episode of laughing very hard. She became dizzy and says she was out for a few seconds. This was when she was seated. She has had no recurrence. She did not have any other symptoms at that time.

## 2015-04-26 NOTE — Patient Instructions (Signed)
Your physician recommends that you schedule a follow-up appointment in: 4 weeks with Dr. Wyline Mood  Your physician recommends that you continue on your current medications as directed. Please refer to the Current Medication list given to you today.  Your physician recommends that you return for lab work in: Fasting   Your physician has requested that you have an echocardiogram. Echocardiography is a painless test that uses sound waves to create images of your heart. It provides your doctor with information about the size and shape of your heart and how well your heart's chambers and valves are working. This procedure takes approximately one hour. There are no restrictions for this procedure.  Your physician has recommended that you wear an event monitor. Event monitors are medical devices that record the heart's electrical activity. Doctors most often [place] these monitors to diagnose arrhythmias. Arrhythmias are problems with the speed or rhythm of the heartbeat. The monitor is a small, portable device. You can wear one while you do your normal daily activities. This is usually used to diagnose what is causing palpitations/syncope (passing out).   Thank you for choosing [facility]!

## 2015-04-26 NOTE — Assessment & Plan Note (Signed)
Patient complains of worsening palpitations associated with dyspnea over the past 2-3 months. She definitely has skipping on exam today. We'll check labs to make sure electrolytes and TSH are normal. Check 30 day monitor to rule out arrhythmia and 2-D echo to make sure she doesn't have any structural abnormalities. She does not drink excessive caffeine. Follow-up with Dr. Wyline Mood in one month to become established.

## 2015-04-26 NOTE — Assessment & Plan Note (Signed)
Patient complains of dyspnea on exertion associated with the palpitations and mild chest tightness. I suspect this is due to obesity and deconditioning. Her weight may prohibit any type of stress testing. We'll begin workup with 2-D echo and 30 day monitor. Follow-up with Dr. Wyline Mood.

## 2015-04-26 NOTE — Progress Notes (Signed)
Cardiology Office Note   Date:  04/26/2015   ID:  DARICA GOREN, DOB 1960-07-12, MRN [id number]  PCP:  [name], MD  Cardiologist:  Dr. Melanie Crazier become established(previous Dr. Dietrich Pates)  Chief Complaint: Palpitations    History of Present Illness: [name] is a 55 y.o. female who presents for worsening palpitations. Patient was seen by Dr. Dietrich Pates in 2013 for possible CHF. She had experienced dyspnea on exertion with decreased exercise tolerance following left Total knee arthroplasty. Symptoms were felt likely due to physical inactivity and obesity. 2-D echo at that time showed mild LVH with normal systolic function EF 55 to 60%.  Patient comes in today complaining of 2-3 month history of worsening palpitations. She says sometimes her heart skips  and other times it races. Had occurred while walking across the parking lot and she had associated dyspnea with it. Occasional she'll get a little tightness but it eases quickly. 2 weeks ago she was laughing quite hard and became dizzy and passed out for a few seconds according to her family. She has not had any recurrence of this. 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 She has hypertension, diabetes mellitus, she does not know her cholesterol profile is a nonsmoker and no family history of CAD. Her mother does see Dr. Wyline Mood and is currently wearing a loop recorder.   Past Medical History  Diagnosis Date  . Morbid obesity (HCC)   . Degenerative joint disease     Left TKA in 07/2009-Dr. Landau  . Chronic back pain   . Syncope     with palpitations; evaluated by [facility] in 2008 with normal echo  . Headache(784.0)     occasionally  . Joint pain   . Joint swelling   . Peripheral edema     to right leg;takes Furosemide occasionally and hasnt had one in a month  . Nocturia   . Hypothyroidism     was on Levothyroxine-has been  off x 4 months  . Depression 2001    doesn't take any meds  . Osteoarthritis of right knee 07/13/2013    Past Surgical History  Procedure Laterality Date  . Abdominal hysterectomy  1995     no oophorectomy  . Cholecystectomy, laparoscopic  2007    Dr. Lovell Sheehan  . Thyroidectomy, partial  2004    Left lobectomy - large benign nodule  . Knee arthroscopy  1995    Dr. Cleophas Dunker   . Total knee arthroplasty  07/2009    Left knee , Dr. Dion Saucier   . Carpal tunnel release  1991    Bilateral  . Craniectomy suboccipital w/ cervical laminectomy / chiari  2007    Repair of Arnold-Chiari malformation  . Tonsillectomy    . Cesarean section      X2  . Cholecystectomy    . Joint replacement Left 2011  . Colonoscopy N/A 03/03/2013    Procedure: COLONOSCOPY;  Surgeon: Malissa Hippo, MD;  [facility];  Service: Endoscopy;  Laterality: N/A;  930  . Total knee arthroplasty Right 07/13/2013    Procedure: TOTAL KNEE ARTHROPLASTY;  Surgeon: Eulas Post, MD;  Location: [facility] OR;  Service: Orthopedics;  Laterality: Right;     Current Outpatient Prescriptions  Medication Sig Dispense Refill  . Fluoxetine HCl, PMDD, 20 MG CAPS Take 1 capsule (20 mg total) by mouth daily. 30 each 4  .  ibuprofen (ADVIL,MOTRIN) 800 MG tablet TAKE 1 TABLET (800 MG TOTAL) BY MOUTH EVERY 8 (EIGHT) HOURS AS NEEDED. 30 tablet 0  . losartan (COZAAR) 100 MG tablet Take 1 tablet (100 mg total) by mouth daily. 30 tablet 4  . metFORMIN (GLUCOPHAGE) 500 MG tablet Take 1 tablet (500 mg total) by mouth 2 (two) times daily with a meal. 60 tablet 4  . temazepam (RESTORIL) 30 MG capsule Take 1 capsule (30 mg total) by mouth at bedtime as needed for sleep. 30 capsule 3   No current facility-administered medications for this visit.    Allergies:   Aleve; Penicillins; Adhesive; and Latex    Social History:  The patient  reports that she has never smoked. She does not have any smokeless tobacco history on file. She reports that  she does not drink alcohol or use illicit drugs.   Family History:  The patient's    family history includes Diabetes in her father and mother; Hyperlipidemia in her father. There is no history of Colon cancer.    ROS:  Please see the history of present illness.   Otherwise, review of systems are positive for none.   All other systems are reviewed and negative.    PHYSICAL EXAM: VS:  Ht 5\' 2"  (1.575 m)  Wt 378 lb (171.46 kg)  BMI 69.12 kg/m2 , BMI Body mass index is 69.12 kg/(m^2). GEN: Morbidly obese, in no acute distress Neck: no JVD, HJR, carotid bruits, or masses Cardiac: RRR with some skipping 1/6 systolic murmur at the left sternal border, no gallop, rubs, thrill or heave,  Respiratory:  clear to auscultation bilaterally, normal work of breathing GI: soft, nontender, nondistended, + BS MS: no deformity or atrophy Extremities: Trace of ankle edema bilaterally without cyanosis, clubbing,  good distal pulses bilaterally.  Skin: warm and dry, no rash Neuro:  Strength and sensation are intact    EKG:  EKG is ordered today. The ekg ordered today demonstrates normal sinus rhythm without acute change   Recent Labs: 11/25/2014: BUN 14; Creatinine, Ser 0.67; Hemoglobin 11.6*; Platelets 361; Potassium 4.3; Sodium 136    Lipid Panel    Component Value Date/Time   CHOL 127 02/15/2014 1237   TRIG 43 02/15/2014 1237   HDL 43 02/15/2014 1237   CHOLHDL 3.0 02/15/2014 1237   VLDL 9 02/15/2014 1237   LDLCALC 75 02/15/2014 1237      Wt Readings from Last 3 Encounters:  04/26/15 378 lb (171.46 kg)  04/06/15 372 lb (168.738 kg)  11/28/14 377 lb (171.006 kg)      Other studies Reviewed: Additional studies/ records that were reviewed today include and review of the records demonstrates:   2-D echo 07/03/11 Study Conclusions  - Left ventricle: The cavity size was normal. Wall thickness   was increased in a pattern of mild LVH. Systolic function   was normal. The estimated  ejection fraction was in the   range of 55% to 60%. Wall motion was normal; there were no   regional wall motion abnormalities. The study is not   technically sufficient to allow evaluation of LV diastolic   function. - Mitral valve: Trivial regurgitation. - Atrial septum: No defect or patent foramen ovale was   identified based on limited views. - Tricuspid valve: Trivial regurgitation. - Pericardium, extracardiac: There was no pericardial   effusion.  CT of chest 9/9/16FINDINGS: No pulmonary embolus is identified. Heart size is normal. No pleural or pericardial effusion. No axillary, hilar or mediastinal lymphadenopathy.  The lungs are clear. Visualized upper abdomen is unremarkable. No focal bony abnormality is identified. Mild anterior endplate spurring in the thoracic spine is noted.   Review of the MIP images confirms the above findings.   IMPRESSION: Negative for pulmonary embolus.  Negative exam.     Electronically Signed   By: Drusilla Kanner M.D.   On: 11/25/2014 07:12   ASSESSMENT AND PLAN: Palpitations Patient complains of worsening palpitations associated with dyspnea over the past 2-3 months. She definitely has skipping on exam today. We'll check labs to make sure electrolytes and TSH are normal. Check 30 day monitor to rule out arrhythmia and 2-D echo to make sure she doesn't have any structural abnormalities. She does not drink excessive caffeine. Follow-up with Dr. Wyline Mood in one month to become established.  Dyspnea on exertion Patient complains of dyspnea on exertion associated with the palpitations and mild chest tightness. I suspect this is due to obesity and deconditioning. Her weight may prohibit any type of stress testing. We'll begin workup with 2-D echo and 30 day monitor. Follow-up with Dr. Wyline Mood.  Syncope Patient says she passed out after an episode of laughing very hard. She became dizzy and says she was out for a few seconds. This was when she was  seated. She has had no recurrence. She did not have any other symptoms at that time.  Morbid obesity Weight loss is imperative. Discussed exercise and weight loss program.     Elson Clan, PA-C  04/26/2015 1:34 PM    [facility] [address] [place], [place], [place]  [address] Phone: [phone]; Fax: [phone]

## 2015-04-27 LAB — CBC WITH DIFFERENTIAL/PLATELET
Basophils Absolute: 0 10*3/uL (ref 0.0–0.1)
Basophils Relative: 0 % (ref 0–1)
EOS ABS: 0.1 10*3/uL (ref 0.0–0.7)
EOS PCT: 2 % (ref 0–5)
HEMATOCRIT: 38.5 % (ref 36.0–46.0)
Hemoglobin: 11.6 g/dL — ABNORMAL LOW (ref 12.0–15.0)
LYMPHS ABS: 3.1 10*3/uL (ref 0.7–4.0)
LYMPHS PCT: 43 % (ref 12–46)
MCH: 22.7 pg — ABNORMAL LOW (ref 26.0–34.0)
MCHC: 30.1 g/dL (ref 30.0–36.0)
MCV: 75.5 fL — AB (ref 78.0–100.0)
MONO ABS: 0.4 10*3/uL (ref 0.1–1.0)
MONOS PCT: 6 % (ref 3–12)
MPV: 10.2 fL (ref 8.6–12.4)
NEUTROS ABS: 3.5 10*3/uL (ref 1.7–7.7)
NEUTROS PCT: 49 % (ref 43–77)
PLATELETS: 405 10*3/uL — AB (ref 150–400)
RBC: 5.1 MIL/uL (ref 3.87–5.11)
RDW: 15.2 % (ref 11.5–15.5)
WBC: 7.2 10*3/uL (ref 4.0–10.5)

## 2015-04-27 LAB — COMPREHENSIVE METABOLIC PANEL
ALBUMIN: 3.7 g/dL (ref 3.6–5.1)
ALT: 9 U/L (ref 6–29)
AST: 12 U/L (ref 10–35)
Alkaline Phosphatase: 81 U/L (ref 33–130)
BILIRUBIN TOTAL: 0.7 mg/dL (ref 0.2–1.2)
BUN: 9 mg/dL (ref 7–25)
CALCIUM: 8.8 mg/dL (ref 8.6–10.4)
CHLORIDE: 103 mmol/L (ref 98–110)
CO2: 28 mmol/L (ref 20–31)
Creat: 0.59 mg/dL (ref 0.50–1.05)
Glucose, Bld: 81 mg/dL (ref 65–99)
POTASSIUM: 4.2 mmol/L (ref 3.5–5.3)
Sodium: 140 mmol/L (ref 135–146)
Total Protein: 7.4 g/dL (ref 6.1–8.1)

## 2015-04-27 LAB — TSH: TSH: 3.65 mIU/L

## 2015-04-29 ENCOUNTER — Ambulatory Visit (INDEPENDENT_AMBULATORY_CARE_PROVIDER_SITE_OTHER): Payer: Medicare Other

## 2015-04-29 DIAGNOSIS — R002 Palpitations: Secondary | ICD-10-CM | POA: Diagnosis not present

## 2015-05-03 ENCOUNTER — Telehealth: Payer: Self-pay | Admitting: *Deleted

## 2015-05-03 ENCOUNTER — Other Ambulatory Visit: Payer: Self-pay

## 2015-05-03 ENCOUNTER — Ambulatory Visit (INDEPENDENT_AMBULATORY_CARE_PROVIDER_SITE_OTHER): Payer: Medicare Other

## 2015-05-03 DIAGNOSIS — I10 Essential (primary) hypertension: Secondary | ICD-10-CM

## 2015-05-03 DIAGNOSIS — R002 Palpitations: Secondary | ICD-10-CM | POA: Diagnosis not present

## 2015-05-03 NOTE — Telephone Encounter (Signed)
Patient informed. 

## 2015-05-03 NOTE — Telephone Encounter (Signed)
Notes Recorded by Dyann Kief, PA-C on 05/03/2015 at 3:54 PM Echo shows mild LVH and normal heart function. Similar to prior echo

## 2015-05-29 ENCOUNTER — Ambulatory Visit: Payer: Self-pay | Admitting: Cardiology

## 2015-05-29 NOTE — Progress Notes (Unsigned)
Patient ID: [name], female   DOB: 01-17-1961, 55 y.o.   MRN: [id number]     Clinical Summary [name] is a 55 y.o.female last seen by PA Lenze, this is our first visit together. She is seen for the following medical problems.    1. Palpitations   2. DOE - echo 04/2015 LVEF 60-65%, normal diastolic function, mild LAE  3.    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 Morbid obesity (HCC)   . Degenerative joint disease     Left TKA in 07/2009-Dr. Landau  . Chronic back pain   . Syncope     with palpitations; evaluated by [facility] and V[facility] in 2008 with normal echo  . Headache(784.0)     occasionally  . Joint pain   . Joint swelling   . Peripheral edema     to right leg;takes Furosemide occasionally and hasnt had one in a month  . Nocturia   . Hypothyroidism     was on Levothyroxine-has been off x 4 months  . Depression 2001    doesn't take any meds  . Osteoarthritis of right knee 07/13/2013     Allergies  Allergen Reactions  . Aleve [Naproxen Sodium] Shortness Of Breath  . Penicillins Swelling    Facial Area   . Adhesive [Tape]     rash  . Latex Rash     Current Outpatient Prescriptions  Medication Sig Dispense Refill  . Fluoxetine HCl, PMDD, 20 MG CAPS Take 1 capsule (20 mg total) by mouth daily. 30 each 4  . ibuprofen (ADVIL,MOTRIN) 800 MG tablet TAKE 1 TABLET (800 MG TOTAL) BY MOUTH EVERY 8 (EIGHT) HOURS AS NEEDED. 30 tablet 0  . losartan (COZAAR) 100 MG tablet Take 1 tablet (100 mg total) by mouth daily. 30 tablet 4  . metFORMIN (GLUCOPHAGE) 500 MG tablet Take 1 tablet (500 mg total) by mouth 2 (two) times daily with a meal. 60 tablet 4  . temazepam (RESTORIL) 30 MG capsule Take 1 capsule (30 mg total) by mouth at bedtime as needed for sleep. 30 capsule 3   No current facility-administered medications for this visit.     Past Surgical History  Procedure Laterality  Date  . Abdominal hysterectomy  1995     no oophorectomy  . Cholecystectomy, laparoscopic  2007    Dr. Lovell Sheehan  . Thyroidectomy, partial  2004    Left lobectomy - large benign nodule  . Knee arthroscopy  1995    Dr. Cleophas Dunker   . Total knee arthroplasty  07/2009    Left knee , Dr. Dion Saucier   . Carpal tunnel release  1991    Bilateral  . Craniectomy suboccipital w/ cervical laminectomy / chiari  2007    Repair of Arnold-Chiari malformation  . Tonsillectomy    . Cesarean section      X2  . Cholecystectomy    . Joint replacement Left 2011  . Colonoscopy N/A 03/03/2013    Procedure: COLONOSCOPY;  Surgeon: Malissa Hippo, MD;  [facility];  Service: Endoscopy;  Laterality: N/A;  930  . Total knee arthroplasty Right 07/13/2013    Procedure: TOTAL KNEE ARTHROPLASTY;  Surgeon: Eulas Post, MD;  Location: [facility] OR;  Service: Orthopedics;  Laterality: Right;     Allergies  Allergen Reactions  . Aleve [Naproxen Sodium] Shortness Of Breath  . Penicillins Swelling    Facial Area   .  Adhesive [Tape]     rash  . Latex Rash      Family History  Problem Relation Age of Onset  . Diabetes Mother   . Diabetes Father     emphsema   . Hyperlipidemia Father   . Colon cancer Neg Hx      Social History [name] reports that she has never smoked. She does not have any smokeless tobacco history on file. [name] reports that she does not drink alcohol.   Review of Systems CONSTITUTIONAL: No weight loss, fever, chills, weakness or fatigue.  HEENT: Eyes: No visual loss, blurred vision, double vision or yellow sclerae.No hearing loss, sneezing, congestion, runny nose or sore throat.  SKIN: No rash or itching.  CARDIOVASCULAR:  RESPIRATORY: No shortness of breath, cough or sputum.  GASTROINTESTINAL: No anorexia, nausea, vomiting or diarrhea. No abdominal pain or blood.  GENITOURINARY: No burning on urination, no polyuria NEUROLOGICAL: No headache, dizziness,  syncope, paralysis, ataxia, numbness or tingling in the extremities. No change in bowel or bladder control.  MUSCULOSKELETAL: No muscle, back pain, joint pain or stiffness.  LYMPHATICS: No enlarged nodes. No history of splenectomy.  PSYCHIATRIC: No history of depression or anxiety.  ENDOCRINOLOGIC: No reports of sweating, cold or heat intolerance. No polyuria or polydipsia.  Marland Kitchen   Physical Examination There were no vitals filed for this visit. There were no vitals filed for this visit.  Gen: resting comfortably, no acute distress HEENT: no scleral icterus, pupils equal round and reactive, no palptable cervical adenopathy,  CV Resp: Clear to auscultation bilaterally GI: abdomen is soft, non-tender, non-distended, normal bowel sounds, no hepatosplenomegaly MSK: extremities are warm, no edema.  Skin: warm, no rash Neuro:  no focal deficits Psych: appropriate affect   Diagnostic Studies 04/2015 Echo Study Conclusions  - Procedure narrative: Transthoracic echocardiography for left  ventricular function evaluation, for right ventricular function  evaluation, and for assessment of valvular function. Image  quality was adequate. The study was technically difficult, as a  result of poor acoustic windows and body habitus. - Left ventricle: The cavity size was normal. Wall thickness was  increased in a pattern of mild LVH. Systolic function was normal.  The estimated ejection fraction was in the range of 60% to 65%.  Images were inadequate for LV wall motion assessment. Left  ventricular diastolic function parameters were normal. - Left atrium: The atrium was mildly dilated.     Assessment and Plan        Antoine Poche, M.D., F.A.C.C.

## 2015-06-01 ENCOUNTER — Other Ambulatory Visit: Payer: Self-pay | Admitting: Family Medicine

## 2015-06-01 ENCOUNTER — Ambulatory Visit (INDEPENDENT_AMBULATORY_CARE_PROVIDER_SITE_OTHER): Payer: Medicare Other | Admitting: Cardiology

## 2015-06-01 ENCOUNTER — Encounter: Payer: Self-pay | Admitting: Cardiology

## 2015-06-01 ENCOUNTER — Telehealth: Payer: Self-pay | Admitting: *Deleted

## 2015-06-01 VITALS — BP 144/82 | HR 76 | Ht 62.0 in | Wt 389.0 lb

## 2015-06-01 DIAGNOSIS — R0600 Dyspnea, unspecified: Secondary | ICD-10-CM

## 2015-06-01 DIAGNOSIS — R0609 Other forms of dyspnea: Secondary | ICD-10-CM | POA: Diagnosis not present

## 2015-06-01 DIAGNOSIS — R002 Palpitations: Secondary | ICD-10-CM

## 2015-06-01 MED ORDER — METOPROLOL TARTRATE 25 MG PO TABS: 25.0000 mg | ORAL_TABLET | Freq: Two times a day (BID) | ORAL | Status: DC

## 2015-06-01 NOTE — Patient Instructions (Signed)
Your physician wants you to follow-up in: 6 MONTHS WITH DR BRANCH You will receive a reminder letter in the mail two months in advance. If you don't receive a letter, please call our office to schedule the follow-up appointment.  Your physician has recommended you make the following change in your medication:   START LOPRESSOR 25 MG TWICE DAILY   Thank you for choosing [facility]!!     

## 2015-06-01 NOTE — Progress Notes (Signed)
Patient ID: [name], female   DOB: [birth year], 55 y.o.   MRN: [id number]     Clinical Summary [name] is a 55 y.o.female seen today for follow up of the following medical problems.   1. Palpitations - 05/2015 heart monitor with PACs and PVCs, 6 beat run of NSVT. - reports symptoms few times a day. Can occur at rest or with exertion. Lasts for approx 5 minutes. No other signicant symptoms during episodes - occasional coffee, iced tea every other day x1 glass, no sodas, no energy drinks, no EtoH.    2. DOE - echo 04/2015 LVEF 60-65%, normal diastolic function, mild LAE - starting to go to gym, increasing her physical activity. Working on diet and weight loss.      SH: mother is also a patient of mine, Building services engineer Past Medical History  Diagnosis Date  . Morbid obesity (HCC)   . Degenerative joint disease     Left TKA in 07/2009-Dr. Landau  . Chronic back pain   . Syncope     with palpitations; evaluated by [facility] in 2008 with normal echo  . Headache(784.0)     occasionally  . Joint pain   . Joint swelling   . Peripheral edema     to right leg;takes Furosemide occasionally and hasnt had one in a month  . Nocturia   . Hypothyroidism     was on Levothyroxine-has been off x 4 months  . Depression 2001    doesn't take any meds  . Osteoarthritis of right knee 07/13/2013     Allergies  Allergen Reactions  . Aleve [Naproxen Sodium] Shortness Of Breath  . Penicillins Swelling    Facial Area   . Adhesive [Tape]     rash  . Latex Rash     Current Outpatient Prescriptions  Medication Sig Dispense Refill  . Fluoxetine HCl, PMDD, 20 MG CAPS Take 1 capsule (20 mg total) by mouth daily. 30 each 4  . ibuprofen (ADVIL,MOTRIN) 800 MG tablet TAKE 1 TABLET (800 MG TOTAL) BY MOUTH EVERY 8 (EIGHT) HOURS AS NEEDED. 30 tablet 0  . losartan (COZAAR) 100 MG tablet Take 1 tablet (100 mg total) by mouth daily. 30 tablet 4  . metFORMIN  (GLUCOPHAGE) 500 MG tablet Take 1 tablet (500 mg total) by mouth 2 (two) times daily with a meal. 60 tablet 4  . temazepam (RESTORIL) 30 MG capsule Take 1 capsule (30 mg total) by mouth at bedtime as needed for sleep. 30 capsule 3   No current facility-administered medications for this visit.     Past Surgical History  Procedure Laterality Date  . Abdominal hysterectomy  1995     no oophorectomy  . Cholecystectomy, laparoscopic  2007    Dr. Lovell Sheehan  . Thyroidectomy, partial  2004    Left lobectomy - large benign nodule  . Knee arthroscopy  1995    Dr. Cleophas Dunker   . Total knee arthroplasty  07/2009    Left knee , Dr. Dion Saucier   . Carpal tunnel release  1991    Bilateral  . Craniectomy suboccipital w/ cervical laminectomy / chiari  2007    Repair of Arnold-Chiari malformation  . Tonsillectomy    . Cesarean section      X2  . Cholecystectomy    . Joint replacement Left 2011  . Colonoscopy N/A 03/03/2013    Procedure: COLONOSCOPY;  Surgeon: Malissa Hippo, MD;  [facility];  Service: Endoscopy;  Laterality: N/A;  930  . Total knee arthroplasty Right 07/13/2013    Procedure: TOTAL KNEE ARTHROPLASTY;  Surgeon: Eulas Post, MD;  Location: [facility] OR;  Service: Orthopedics;  Laterality: Right;     Allergies  Allergen Reactions  . Aleve [Naproxen Sodium] Shortness Of Breath  . Penicillins Swelling    Facial Area   . Adhesive [Tape]     rash  . Latex Rash      Family History  Problem Relation Age of Onset  . Diabetes Mother   . Diabetes Father     emphsema   . Hyperlipidemia Father   . Colon cancer Neg Hx      Social History [name] reports that she has never smoked. She does not have any smokeless tobacco history on file. [name] reports that she does not drink alcohol.   Review of Systems CONSTITUTIONAL: No weight loss, fever, chills, weakness or fatigue.  HEENT: Eyes: No visual loss, blurred vision, double vision or yellow sclerae.No  hearing loss, sneezing, congestion, runny nose or sore throat.  SKIN: No rash or itching.  CARDIOVASCULAR: per HPI RESPIRATORY: No shortness of breath, cough or sputum.  GASTROINTESTINAL: No anorexia, nausea, vomiting or diarrhea. No abdominal pain or blood.  GENITOURINARY: No burning on urination, no polyuria NEUROLOGICAL: No headache, dizziness, syncope, paralysis, ataxia, numbness or tingling in the extremities. No change in bowel or bladder control.  MUSCULOSKELETAL: No muscle, back pain, joint pain or stiffness.  LYMPHATICS: No enlarged nodes. No history of splenectomy.  PSYCHIATRIC: No history of depression or anxiety.  ENDOCRINOLOGIC: No reports of sweating, cold or heat intolerance. No polyuria or polydipsia.  Marland Kitchen   Physical Examination Filed Vitals:   06/01/15 1523  BP: 144/82  Pulse: 76   Filed Vitals:   06/01/15 1523  Height: 5\' 2"  (1.575 m)  Weight: 389 lb (176.449 kg)    Gen: resting comfortably, no acute distress HEENT: no scleral icterus, pupils equal round and reactive, no palptable cervical adenopathy,  CV: RRR, 2/6 sysotlic murmur RUS,, no jvd Resp: Clear to auscultation bilaterally GI: abdomen is soft, non-tender, non-distended, normal bowel sounds, no hepatosplenomegaly MSK: extremities are warm, no edema.  Skin: warm, no rash Neuro:  no focal deficits Psych: appropriate affect   Diagnostic Studies 04/2015 Echo Study Conclusions  - Procedure narrative: Transthoracic echocardiography for left  ventricular function evaluation, for right ventricular function  evaluation, and for assessment of valvular function. Image  quality was adequate. The study was technically difficult, as a  result of poor acoustic windows and body habitus. - Left ventricle: The cavity size was normal. Wall thickness was  increased in a pattern of mild LVH. Systolic function was normal.  The estimated ejection fraction was in the range of 60% to 65%.  Images were  inadequate for LV wall motion assessment. Left  ventricular diastolic function parameters were normal. - Left atrium: The atrium was mildly dilated.  05/2015 Event monitor  Telemetry strips show normal sinus rhythm with occasional PACs and PVCs  Reported symptoms correlate with normal sinus rhythm and PACs  Detected 6 beat run of NSVT that was asymptomatic.  Assessment and Plan  1. Palpitations - will start lopressor 25mg  bid and follow symptoms  2. DOE - echo without significant pathology. Likely due to deconditioning and her severe obesity. Follow symptoms as she starts to increase her activity, she has recently joined a gym. Do not see strong indication for ischemic testing.    F/u 6 months  Arnoldo Lenis, M.D.

## 2015-06-01 NOTE — Telephone Encounter (Signed)
Pt has office visit with Dr. Wyline Mood today. Routed to pcp

## 2015-06-01 NOTE — Telephone Encounter (Signed)
-----   Message from Antoine Poche, MD sent at 05/31/2015  3:40 PM EDT ----- Heart monitor shows some occasional extra heart beats, we will discuss further at our f/u  J BrancH MD

## 2015-07-13 ENCOUNTER — Other Ambulatory Visit: Payer: Self-pay | Admitting: Family Medicine

## 2015-07-13 DIAGNOSIS — Z1231 Encounter for screening mammogram for malignant neoplasm of breast: Secondary | ICD-10-CM

## 2015-07-17 ENCOUNTER — Ambulatory Visit (HOSPITAL_COMMUNITY): Payer: Self-pay

## 2015-07-19 ENCOUNTER — Ambulatory Visit (HOSPITAL_COMMUNITY): Payer: Self-pay

## 2015-07-19 ENCOUNTER — Other Ambulatory Visit: Payer: Self-pay | Admitting: Family Medicine

## 2015-07-19 ENCOUNTER — Ambulatory Visit (HOSPITAL_COMMUNITY)
Admission: RE | Admit: 2015-07-19 | Discharge: 2015-07-19 | Disposition: A | Discharge status: Home / Self Care | Payer: Medicare Other | Source: Ambulatory Visit | Attending: Family Medicine | Admitting: Family Medicine

## 2015-07-19 DIAGNOSIS — Z1231 Encounter for screening mammogram for malignant neoplasm of breast: Secondary | ICD-10-CM

## 2015-07-28 ENCOUNTER — Other Ambulatory Visit: Payer: Self-pay | Admitting: Family Medicine

## 2015-07-28 DIAGNOSIS — I10 Essential (primary) hypertension: Secondary | ICD-10-CM | POA: Diagnosis not present

## 2015-07-28 DIAGNOSIS — Z13220 Encounter for screening for lipoid disorders: Secondary | ICD-10-CM | POA: Diagnosis not present

## 2015-07-28 DIAGNOSIS — R7989 Other specified abnormal findings of blood chemistry: Secondary | ICD-10-CM | POA: Diagnosis not present

## 2015-07-28 DIAGNOSIS — R7309 Other abnormal glucose: Secondary | ICD-10-CM | POA: Diagnosis not present

## 2015-07-28 DIAGNOSIS — R7303 Prediabetes: Secondary | ICD-10-CM | POA: Diagnosis not present

## 2015-07-29 LAB — BASIC METABOLIC PANEL
BUN: 15 mg/dL (ref 7–25)
CALCIUM: 8.5 mg/dL — AB (ref 8.6–10.4)
CO2: 27 mmol/L (ref 20–31)
Chloride: 101 mmol/L (ref 98–110)
Creat: 0.61 mg/dL (ref 0.50–1.05)
Glucose, Bld: 92 mg/dL (ref 65–99)
POTASSIUM: 4.3 mmol/L (ref 3.5–5.3)
SODIUM: 138 mmol/L (ref 135–146)

## 2015-07-29 LAB — LIPID PANEL
CHOL/HDL RATIO: 2.9 ratio (ref <=5.0)
Cholesterol: 126 mg/dL (ref 125–200)
HDL: 43 mg/dL — AB (ref >=46)
LDL CALC: 73 mg/dL (ref <130)
Triglycerides: 51 mg/dL (ref <150)
VLDL: 10 mg/dL (ref <30)

## 2015-07-29 LAB — TSH: TSH: 5.19 m[IU]/L — AB

## 2015-07-29 LAB — HEMOGLOBIN A1C
HEMOGLOBIN A1C: 6.1 % — AB (ref <5.7)
MEAN PLASMA GLUCOSE: 128 mg/dL

## 2015-07-31 ENCOUNTER — Encounter: Payer: Self-pay | Admitting: Family Medicine

## 2015-07-31 ENCOUNTER — Ambulatory Visit (INDEPENDENT_AMBULATORY_CARE_PROVIDER_SITE_OTHER): Payer: Medicare Other | Admitting: Family Medicine

## 2015-07-31 VITALS — BP 142/82 | HR 95 | Resp 16 | Ht 62.0 in | Wt 386.0 lb

## 2015-07-31 DIAGNOSIS — Z23 Encounter for immunization: Secondary | ICD-10-CM | POA: Diagnosis not present

## 2015-07-31 DIAGNOSIS — F329 Major depressive disorder, single episode, unspecified: Secondary | ICD-10-CM | POA: Diagnosis not present

## 2015-07-31 DIAGNOSIS — E119 Type 2 diabetes mellitus without complications: Secondary | ICD-10-CM

## 2015-07-31 DIAGNOSIS — E1169 Type 2 diabetes mellitus with other specified complication: Secondary | ICD-10-CM

## 2015-07-31 DIAGNOSIS — F32A Depression, unspecified: Secondary | ICD-10-CM

## 2015-07-31 DIAGNOSIS — E669 Obesity, unspecified: Secondary | ICD-10-CM

## 2015-07-31 DIAGNOSIS — R7303 Prediabetes: Secondary | ICD-10-CM | POA: Insufficient documentation

## 2015-07-31 DIAGNOSIS — I10 Essential (primary) hypertension: Secondary | ICD-10-CM

## 2015-07-31 LAB — T3, FREE: T3 FREE: 2.7 pg/mL (ref 2.3–4.2)

## 2015-07-31 LAB — T4, FREE: Free T4: 0.9 ng/dL (ref 0.8–1.8)

## 2015-07-31 MED ORDER — IBUPROFEN 800 MG PO TABS: ORAL_TABLET | ORAL | Status: DC

## 2015-07-31 NOTE — Patient Instructions (Addendum)
F/u in 3 month, call if you  Need me before  NEED to take medication as it is prescribed regularly  Please start using a pill box, and take 30 minutes every day to focus on your personal health Blood pressure elevated at visit since not taking meds as you should!  Pneumonia vaccine today, foot exam and microalb  You are diabetic  Weight loss surgery will be to your health benefit, I encourage you to pursue this  Thank you  for choosing [facility]. We consider it a privelige to serve you.  Delivering excellent health care in a caring and  compassionate way is our goal.  Partnering with you,  so that together we can achieve this goal is our strategy.

## 2015-07-31 NOTE — Progress Notes (Signed)
Subjective:    Patient ID: [name], female    DOB: [birth year], 55 y.o.   MRN: [id number]  HPI   [name]     MRN: [id number]      DOB: 1960-12-26   HPI [name] is here for follow up and re-evaluation of chronic medical conditions, medication management and review of any available recent lab and radiology data.  Preventive health is updated, specifically  Cancer screening and Immunization.   Questions or concerns regarding consultations or procedures which the PT has had in the interim are  addressed. The PT denies any adverse reactions to current medications since the last visit.  There are no new concerns.  There are no specific complaints   ROS Denies recent fever or chills. Denies sinus pressure, nasal congestion, ear pain or sore throat. Denies chest congestion, productive cough or wheezing. Denies chest pains, palpitations and leg swelling Denies abdominal pain, nausea, vomiting,diarrhea or constipation.   Denies dysuria, frequency, hesitancy or incontinence. Denies joint pain, swelling and limitation in mobility. Denies headaches, seizures, numbness, or tingling. Denies depression, anxiety or insomnia. Denies skin break down or rash.   PE  BP 142/82 mmHg  Pulse 95  Resp 16  Ht 5\' 2"  (1.575 m)  Wt 386 lb (175.088 kg)  BMI 70.58 kg/m2  SpO2 97%  Patient alert and oriented and in no cardiopulmonary distress.  HEENT: No facial asymmetry, EOMI,   oropharynx pink and moist.  Neck supple no JVD, no mass.  Chest: Clear to auscultation bilaterally.  CVS: S1, S2 no murmurs, no S3.Regular rate.  ABD: Soft non tender.   Ext: No edema  MS: Adequate though reduced  ROM spine, shoulders, hips and knees.  Skin: Intact, no ulcerations or rash noted.  Psych: Good eye contact, normal affect. Memory intact not anxious or depressed appearing.  CNS: CN 2-12 intact, power,  normal throughout.no focal deficits noted.   Assessment &  Plan  Diabetes mellitus type 2 in obese [facility]) [name] is reminded of the importance of commitment to daily physical activity for 30 minutes or more, as able and the need to limit carbohydrate intake to 30 to 60 grams per meal to help with blood sugar control.   The need to take medication as prescribed, test blood sugar as directed, and to call between visits if there is a concern that blood sugar is uncontrolled is also discussed.   [name] is reminded of the importance of daily foot exam, annual eye examination, and good blood sugar, blood pressure and cholesterol control. Improved  Diabetic Labs Latest Ref Rng 07/31/2015 07/28/2015 04/26/2015 11/25/2014 02/15/2014  HbA1c <5.7 % - 6.1(H) - 6.6(H) 6.3(H)  Microalbumin Not estab mg/dL 0.4 - - - -  Micro/Creat Ratio <30 mcg/mg creat 4 - - - -  Chol 125 - 200 mg/dL - 14/03/2013 - - 937  HDL 169 mg/dL - >=67) - - 43  Calc LDL <130 mg/dL - 73 - - 75  Triglycerides <150 mg/dL - 51 - - 43  Creatinine 0.50 - 1.05 mg/dL - 89(F 8.10 1.75 1.02   BP/Weight 07/31/2015 06/01/2015 04/26/2015 04/06/2015 11/28/2014 11/25/2014 09/06/2014  Systolic BP 142 144 130 124 150 129 140  Diastolic BP 82 82 80 84 90 92 84  Wt. (Lbs) 386 389 378 372 377 372 370.4  BMI 70.58 71.13 69.12 68.02 68.94 68.02 67.73   Foot/eye exam completion dates 07/31/2015  Foot Form Completion Done  Morbid obesity Deteriorated. Patient re-educated about  the importance of commitment to a  minimum of 150 minutes of exercise per week.  The importance of healthy food choices with portion control discussed. Encouraged to start a food diary, count calories and to consider  joining a support group. Sample diet sheets offered. Goals set by the patient for the next several months.   Weight /BMI 07/31/2015 06/01/2015 04/26/2015  WEIGHT 386 lb 389 lb 378 lb  HEIGHT 5\' 2"  5\' 2"  5\' 2"   BMI 70.58 kg/m2 71.13 kg/m2 69.12 kg/m2    Current exercise per week 30 minutes.Bypass surgery  indicated   Depression Controlled, no change in medication   Essential hypertension Uncontrolled due to non compliance DASH diet and commitment to daily physical activity for a minimum of 30 minutes discussed and encouraged, as a part of hypertension management. The importance of attaining a healthy weight is also discussed.  BP/Weight 07/31/2015 06/01/2015 04/26/2015 04/06/2015 11/28/2014 11/25/2014 09/06/2014  Systolic BP 142 144 130 124 150 129 140  Diastolic BP 82 82 80 84 90 92 84  Wt. (Lbs) 386 389 378 372 377 372 370.4  BMI 70.58 71.13 69.12 68.02 68.94 68.02 67.73             Review of Systems     Objective:   Physical Exam        Assessment & Plan:

## 2015-08-01 LAB — MICROALBUMIN / CREATININE URINE RATIO
CREATININE, URINE: 107 mg/dL (ref 20–320)
Microalb Creat Ratio: 4 mcg/mg creat (ref <30)
Microalb, Ur: 0.4 mg/dL

## 2015-08-13 NOTE — Assessment & Plan Note (Signed)
Controlled, no change in medication  

## 2015-08-13 NOTE — Assessment & Plan Note (Signed)
Deteriorated. Patient re-educated about  the importance of commitment to a  minimum of 150 minutes of exercise per week.  The importance of healthy food choices with portion control discussed. Encouraged to start a food diary, count calories and to consider  joining a support group. Sample diet sheets offered. Goals set by the patient for the next several months.   Weight /BMI 07/31/2015 06/01/2015 04/26/2015  WEIGHT 386 lb 389 lb 378 lb  HEIGHT 5\' 2"  5\' 2"  5\' 2"   BMI 70.58 kg/m2 71.13 kg/m2 69.12 kg/m2    Current exercise per week 30 minutes.Bypass surgery indicated

## 2015-08-13 NOTE — Assessment & Plan Note (Signed)
Ms. Fayad is reminded of the importance of commitment to daily physical activity for 30 minutes or more, as able and the need to limit carbohydrate intake to 30 to 60 grams per meal to help with blood sugar control.   The need to take medication as prescribed, test blood sugar as directed, and to call between visits if there is a concern that blood sugar is uncontrolled is also discussed.   Ms. Eastwood is reminded of the importance of daily foot exam, annual eye examination, and good blood sugar, blood pressure and cholesterol control. Improved  Diabetic Labs Latest Ref Rng 07/31/2015 07/28/2015 04/26/2015 11/25/2014 02/15/2014  HbA1c <5.7 % - 6.1(H) - 6.6(H) 6.3(H)  Microalbumin Not estab mg/dL 0.4 - - - -  Micro/Creat Ratio <30 mcg/mg creat 4 - - - -  Chol 125 - 200 mg/dL - 384 - - 536  HDL >=46 mg/dL - 80(H) - - 43  Calc LDL <130 mg/dL - 73 - - 75  Triglycerides <150 mg/dL - 51 - - 43  Creatinine 0.50 - 1.05 mg/dL - 2.12 2.48 2.50 0.37   BP/Weight 07/31/2015 06/01/2015 04/26/2015 04/06/2015 11/28/2014 11/25/2014 09/06/2014  Systolic BP 142 144 130 124 150 129 140  Diastolic BP 82 82 80 84 90 92 84  Wt. (Lbs) 386 389 378 372 377 372 370.4  BMI 70.58 71.13 69.12 68.02 68.94 68.02 67.73   Foot/eye exam completion dates 07/31/2015  Foot Form Completion Done

## 2015-08-13 NOTE — Assessment & Plan Note (Signed)
Uncontrolled due to non compliance DASH diet and commitment to daily physical activity for a minimum of 30 minutes discussed and encouraged, as a part of hypertension management. The importance of attaining a healthy weight is also discussed.  BP/Weight 07/31/2015 06/01/2015 04/26/2015 04/06/2015 11/28/2014 11/25/2014 09/06/2014  Systolic BP 142 144 130 124 150 129 140  Diastolic BP 82 82 80 84 90 92 84  Wt. (Lbs) 386 389 378 372 377 372 370.4  BMI 70.58 71.13 69.12 68.02 68.94 68.02 67.73

## 2015-11-01 ENCOUNTER — Encounter: Payer: Self-pay | Admitting: Family Medicine

## 2015-11-01 ENCOUNTER — Ambulatory Visit (INDEPENDENT_AMBULATORY_CARE_PROVIDER_SITE_OTHER): Payer: Medicare Other | Admitting: Family Medicine

## 2015-11-01 VITALS — BP 128/82 | HR 86 | Temp 98.8°F | Resp 18 | Ht 62.0 in | Wt 387.0 lb

## 2015-11-01 DIAGNOSIS — E1169 Type 2 diabetes mellitus with other specified complication: Secondary | ICD-10-CM

## 2015-11-01 DIAGNOSIS — H6504 Acute serous otitis media, recurrent, right ear: Secondary | ICD-10-CM | POA: Diagnosis not present

## 2015-11-01 DIAGNOSIS — I10 Essential (primary) hypertension: Secondary | ICD-10-CM | POA: Diagnosis not present

## 2015-11-01 DIAGNOSIS — J0100 Acute maxillary sinusitis, unspecified: Secondary | ICD-10-CM | POA: Diagnosis not present

## 2015-11-01 DIAGNOSIS — E119 Type 2 diabetes mellitus without complications: Secondary | ICD-10-CM

## 2015-11-01 DIAGNOSIS — H6691 Otitis media, unspecified, right ear: Secondary | ICD-10-CM | POA: Insufficient documentation

## 2015-11-01 DIAGNOSIS — J209 Acute bronchitis, unspecified: Secondary | ICD-10-CM

## 2015-11-01 DIAGNOSIS — E669 Obesity, unspecified: Secondary | ICD-10-CM

## 2015-11-01 DIAGNOSIS — J019 Acute sinusitis, unspecified: Secondary | ICD-10-CM | POA: Insufficient documentation

## 2015-11-01 MED ORDER — FLUCONAZOLE 150 MG PO TABS: ORAL_TABLET | ORAL | 0 refills | Status: DC

## 2015-11-01 MED ORDER — BENZONATATE 100 MG PO CAPS: 100.0000 mg | ORAL_CAPSULE | Freq: Two times a day (BID) | ORAL | 0 refills | Status: DC | PRN

## 2015-11-01 MED ORDER — LEVOFLOXACIN 750 MG PO TABS: 750.0000 mg | ORAL_TABLET | Freq: Every day | ORAL | 0 refills | Status: DC

## 2015-11-01 NOTE — Progress Notes (Signed)
   AFSHEEN ANTONY     MRN: [id number]      DOB: [birth year]   HPI [name] is here for follow up and re-evaluation of chronic medical conditions, medication management and review of any available recent lab and radiology data.  5 day h/o worsening head and chest congestion, associated with fever and chills intermittently. Nasal drainage has thickened , and is yellowish green, and at times bloody. Sputum is thick and yellow. C/o bilateral ear pressure, denies hearing loss and sore throat. Increasing fatigue , poor appetitie and sleep disturbed by cough. No improvement with OTC medication. Right ear pain also   ROS  Denies chest pains, palpitations and leg swelling Denies abdominal pain, nausea, vomiting,diarrhea or constipation.   Denies dysuria, frequency, hesitancy or incontinence. Denies joint pain, swelling and limitation in mobility. Denies headaches, seizures, numbness, or tingling. Denies depression, anxiety or insomnia. Denies skin break down or rash.   PE  BP 128/82   Pulse 86   Temp 98.8 F (37.1 C) (Oral)   Resp 18   Ht 5\' 2"  (1.575 m)   Wt (!) 387 lb (175.5 kg)   SpO2 98%   BMI 70.78 kg/m   Patient alert and oriented and in no cardiopulmonary distress.  HEENT: No facial asymmetry, EOMI,   oropharynx pink and moist.  Neck supple no JVD, no mass.maxillary sinus tenderness, right more than left, right tM dull and erythematous, left tM clear with good light reflex  Chest: decreased air entry, bilateral crackles and few wheezes  CVS: S1, S2 no murmurs, no S3.Regular rate.  ABD: Soft non tender.   Ext: No edema  MS: Adequate ROM spine, shoulders, hips and knees.  Skin: Intact, no ulcerations or rash noted.  Psych: Good eye contact, normal affect. Memory intact not anxious or depressed appearing.  CNS: CN 2-12 intact, power,  normal throughout.no focal deficits noted.   Assessment & Plan  Acute bronchitis 4 day h/o chest congestion with  sputum and chills Decongestant, antibiotic, and work excuse Tylenol, fluids and rest , call if not improved  Right otitis media 1 week course of levaquin prescribed  Acute sinusitis 1 week course of levaquin prescribed  Essential hypertension Controlled, no change in medication DASH diet and commitment to daily physical activity for a minimum of 30 minutes discussed and encouraged, as a part of hypertension management. The importance of attaining a healthy weight is also discussed.  BP/Weight 11/01/2015 07/31/2015 06/01/2015 04/26/2015 04/06/2015 11/28/2014 11/25/2014  Systolic BP 128 142 144 130 124 150 129  Diastolic BP 82 82 82 80 84 90 92  Wt. (Lbs) 387 386 389 378 372 377 372  BMI 70.78 70.58 71.13 69.12 68.02 68.94 68.02       Morbid obesity Deteriorated. Patient re-educated about  the importance of commitment to a  minimum of 150 minutes of exercise per week.  The importance of healthy food choices with portion control discussed. Encouraged to start a food diary, count calories and to consider  joining a support group. Sample diet sheets offered. Goals set by the patient for the next several months.   Weight /BMI 11/01/2015 07/31/2015 06/01/2015  WEIGHT 387 lb 386 lb 389 lb  HEIGHT 5\' 2"  5\' 2"  5\' 2"   BMI 70.78 kg/m2 70.58 kg/m2 71.13 kg/m2

## 2015-11-01 NOTE — Patient Instructions (Signed)
Annual wellness in October, call if you need me before  You are  Treated for acute sinusitis, bronchitis and right ear infection  Levaquin, decongestant and fluconazole sent in   Work excuse from 8/14 to return 11/06/2015  [facility] you feel better soon  Labs 1 week before follow up

## 2015-11-01 NOTE — Assessment & Plan Note (Addendum)
4 day h/o chest congestion with sputum and chills Decongestant, antibiotic, and work excuse Tylenol, fluids and rest , call if not improved

## 2015-11-02 DIAGNOSIS — I10 Essential (primary) hypertension: Secondary | ICD-10-CM | POA: Diagnosis not present

## 2015-11-02 DIAGNOSIS — Z6841 Body Mass Index (BMI) 40.0 and over, adult: Secondary | ICD-10-CM | POA: Diagnosis not present

## 2015-11-02 DIAGNOSIS — Z96653 Presence of artificial knee joint, bilateral: Secondary | ICD-10-CM | POA: Diagnosis not present

## 2015-11-02 DIAGNOSIS — E1142 Type 2 diabetes mellitus with diabetic polyneuropathy: Secondary | ICD-10-CM | POA: Diagnosis not present

## 2015-11-03 ENCOUNTER — Ambulatory Visit: Payer: Self-pay | Admitting: Family Medicine

## 2015-11-04 NOTE — Assessment & Plan Note (Signed)
Controlled, no change in medication DASH diet and commitment to daily physical activity for a minimum of 30 minutes discussed and encouraged, as a part of hypertension management. The importance of attaining a healthy weight is also discussed.  BP/Weight 11/01/2015 07/31/2015 06/01/2015 04/26/2015 04/06/2015 11/28/2014 11/25/2014  Systolic BP 128 142 144 130 124 150 129  Diastolic BP 82 82 82 80 84 90 92  Wt. (Lbs) 387 386 389 378 372 377 372  BMI 70.78 70.58 71.13 69.12 68.02 68.94 68.02

## 2015-11-04 NOTE — Assessment & Plan Note (Signed)
1 week course of levaquin prescribed 

## 2015-11-04 NOTE — Assessment & Plan Note (Signed)
Deteriorated. Patient re-educated about  the importance of commitment to a  minimum of 150 minutes of exercise per week.  The importance of healthy food choices with portion control discussed. Encouraged to start a food diary, count calories and to consider  joining a support group. Sample diet sheets offered. Goals set by the patient for the next several months.   Weight /BMI 11/01/2015 07/31/2015 06/01/2015  WEIGHT 387 lb 386 lb 389 lb  HEIGHT 5\' 2"  5\' 2"  5\' 2"   BMI 70.78 kg/m2 70.58 kg/m2 71.13 kg/m2

## 2015-11-11 DIAGNOSIS — M545 Low back pain: Secondary | ICD-10-CM | POA: Diagnosis not present

## 2015-11-11 DIAGNOSIS — M25559 Pain in unspecified hip: Secondary | ICD-10-CM | POA: Diagnosis not present

## 2015-11-13 ENCOUNTER — Ambulatory Visit (INDEPENDENT_AMBULATORY_CARE_PROVIDER_SITE_OTHER): Payer: Medicare Other | Admitting: Family Medicine

## 2015-11-13 ENCOUNTER — Encounter: Payer: Self-pay | Admitting: Family Medicine

## 2015-11-13 VITALS — BP 124/84 | HR 75 | Resp 16 | Ht 62.0 in | Wt 387.0 lb

## 2015-11-13 DIAGNOSIS — M549 Dorsalgia, unspecified: Secondary | ICD-10-CM

## 2015-11-13 DIAGNOSIS — M5441 Lumbago with sciatica, right side: Secondary | ICD-10-CM | POA: Diagnosis not present

## 2015-11-13 DIAGNOSIS — M5430 Sciatica, unspecified side: Secondary | ICD-10-CM | POA: Insufficient documentation

## 2015-11-13 MED ORDER — GABAPENTIN 300 MG PO CAPS: 300.0000 mg | ORAL_CAPSULE | Freq: Two times a day (BID) | ORAL | 2 refills | Status: DC

## 2015-11-13 MED ORDER — METHYLPREDNISOLONE ACETATE 40 MG/ML INJ SUSP (RADIOLOG
120.0000 mg | Freq: Once | INTRAMUSCULAR | Status: AC
  Administered 2015-11-13: 120 mg via INTRAMUSCULAR

## 2015-11-13 MED ORDER — RANITIDINE HCL 300 MG PO TABS: 300.0000 mg | ORAL_TABLET | Freq: Every day | ORAL | 0 refills | Status: DC

## 2015-11-13 MED ORDER — KETOROLAC TROMETHAMINE 60 MG/2ML IM SOLN
60.0000 mg | Freq: Once | INTRAMUSCULAR | Status: AC
  Administered 2015-11-13: 60 mg via INTRAMUSCULAR

## 2015-11-13 MED ORDER — CYCLOBENZAPRINE HCL 10 MG PO TABS: 10.0000 mg | ORAL_TABLET | Freq: Three times a day (TID) | ORAL | 1 refills | Status: DC | PRN

## 2015-11-13 MED ORDER — PREDNISONE 10 MG (21) PO TBPK: 10.0000 mg | ORAL_TABLET | Freq: Every day | ORAL | 0 refills | Status: DC

## 2015-11-13 NOTE — Patient Instructions (Signed)
F/u as before , call if you need me sooner  Injections in office today, prednisone, flexeril and gabapentin and zantac prescribed  Use ibuprofen 800 one twice daily for next 1 week  Hope  You feel better soon

## 2015-11-13 NOTE — Progress Notes (Signed)
   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 Denies sinus pressure, nasal congestion, ear pain or sore throat. Denies chest congestion, productive cough or wheezing. Denies chest pains, palpitations and leg swelling Denies abdominal pain, nausea, vomiting,diarrhea or constipation.   Denies dysuria, frequency, hesitancy or incontinence.  Denies headaches, seizures, numbness, or tingling. Denies depression, anxiety or insomnia.    PE  BP 124/84   Pulse 75   Resp 16   Ht 5\' 2"  (1.575 m)   Wt (!) 387 lb (175.5 kg)   SpO2 98%   BMI 70.78 kg/m   Patient alert and oriented and in no cardiopulmonary distress.  HEENT: No facial asymmetry, EOMI,   oropharynx pink and moist.  Neck supple no JVD, no mass.  Chest: Clear to auscultation bilaterally.  CVS: S1, S2 no murmurs, no S3.Regular rate.  ABD: Soft non tender.   Ext: No edema  MS: decreased  ROM spine,adequate in  shoulders, hips and knees.  Skin: Intact, no ulcerations or rash noted.  Psych: Good eye contact, normal affect. Memory intact not anxious or depressed appearing.  CNS: CN 2-12 intact, power,  normal throughout.no focal deficits noted.   Assessment & Plan  Back pain with sciatica Uncontrolled.Toradol and depo medrol administered IM in the office , to be followed by a short course of oral prednisone and NSAIDS.

## 2015-11-13 NOTE — Assessment & Plan Note (Signed)
Uncontrolled.Toradol and depo medrol administered IM in the office , to be followed by a short course of oral prednisone and NSAIDS.  

## 2015-11-21 DIAGNOSIS — I10 Essential (primary) hypertension: Secondary | ICD-10-CM | POA: Diagnosis not present

## 2015-11-21 DIAGNOSIS — E1142 Type 2 diabetes mellitus with diabetic polyneuropathy: Secondary | ICD-10-CM | POA: Diagnosis not present

## 2015-11-21 DIAGNOSIS — Z96653 Presence of artificial knee joint, bilateral: Secondary | ICD-10-CM | POA: Diagnosis not present

## 2015-12-20 DIAGNOSIS — I10 Essential (primary) hypertension: Secondary | ICD-10-CM | POA: Diagnosis not present

## 2015-12-20 DIAGNOSIS — E1142 Type 2 diabetes mellitus with diabetic polyneuropathy: Secondary | ICD-10-CM | POA: Diagnosis not present

## 2015-12-20 DIAGNOSIS — Z96653 Presence of artificial knee joint, bilateral: Secondary | ICD-10-CM | POA: Diagnosis not present

## 2015-12-27 DIAGNOSIS — Z713 Dietary counseling and surveillance: Secondary | ICD-10-CM | POA: Diagnosis not present

## 2015-12-27 DIAGNOSIS — E1142 Type 2 diabetes mellitus with diabetic polyneuropathy: Secondary | ICD-10-CM | POA: Diagnosis not present

## 2016-01-02 ENCOUNTER — Ambulatory Visit: Payer: Self-pay

## 2016-01-02 ENCOUNTER — Encounter: Payer: Self-pay | Admitting: Family Medicine

## 2016-01-02 DIAGNOSIS — Z96653 Presence of artificial knee joint, bilateral: Secondary | ICD-10-CM | POA: Diagnosis not present

## 2016-01-02 DIAGNOSIS — E1142 Type 2 diabetes mellitus with diabetic polyneuropathy: Secondary | ICD-10-CM | POA: Diagnosis not present

## 2016-01-02 DIAGNOSIS — I10 Essential (primary) hypertension: Secondary | ICD-10-CM | POA: Diagnosis not present

## 2016-01-09 ENCOUNTER — Ambulatory Visit (INDEPENDENT_AMBULATORY_CARE_PROVIDER_SITE_OTHER): Payer: Medicare Other

## 2016-01-09 VITALS — BP 122/72 | HR 60 | Resp 18 | Ht 62.0 in | Wt 372.0 lb

## 2016-01-09 DIAGNOSIS — Z23 Encounter for immunization: Secondary | ICD-10-CM | POA: Diagnosis not present

## 2016-01-09 DIAGNOSIS — Z0000 Encounter for general adult medical examination without abnormal findings: Secondary | ICD-10-CM | POA: Diagnosis not present

## 2016-01-09 NOTE — Patient Instructions (Signed)
Thank you for choosing [facility] for your health care needs  The Annual Wellness Visit is designed to allow [place] the chance to assist you in preserving and improving you health.   Dr. Lodema Hong will see you back in 4 months  If any labs are needed they will be mailed to you with the date to have them drawn  The contact # for MyChart is [phone]

## 2016-01-09 NOTE — Progress Notes (Signed)
Subjective:    [name] is a 55 y.o. female who presents for Medicare Annual/Subsequent preventive examination.  Preventive Screening-Counseling & Management  Tobacco History  Smoking Status  . Never Smoker  Smokeless Tobacco  . Never Used       Current Problems (verified) Patient Active Problem List   Diagnosis Date Noted  . Back pain with sciatica 11/13/2015  . Diabetes mellitus type 2 in obese (HCC) 07/31/2015  . Palpitations 04/26/2015  . Urinary incontinence 03/15/2014  . Knee osteoarthritis 07/13/2013  . Chronic diastolic heart failure (HCC) 01/06/2013  . Snoring disorder 01/03/2013  . Thoracic spine pain 12/29/2012  . Vitamin D deficiency 12/08/2012  . GERD (gastroesophageal reflux disease) 07/15/2012  . Exercise intolerance 11/26/2011  . Essential hypertension 11/26/2011  . Depression   . 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 Anemia 07/03/2011  . Morbid obesity (HCC) 12/03/2007    Medications Prior to Visit Current Outpatient Prescriptions on File Prior to Visit  Medication Sig Dispense Refill  . cyclobenzaprine (FLEXERIL) 10 MG tablet Take 1 tablet (10 mg total) by mouth 3 (three) times daily as needed for muscle spasms. 30 tablet 1  . Fluoxetine HCl, PMDD, 20 MG CAPS Take 1 capsule (20 mg total) by mouth daily. 30 each 4  . gabapentin (NEURONTIN) 300 MG capsule Take 1 capsule (300 mg total) by mouth 2 (two) times daily. 60 capsule 2  . losartan (COZAAR) 100 MG tablet Take 1 tablet (100 mg total) by mouth daily. 30 tablet 4  . metFORMIN (GLUCOPHAGE) 500 MG tablet Take 1 tablet (500 mg total) by mouth 2 (two) times daily with a meal. 60 tablet 4  . metoprolol tartrate (LOPRESSOR) 25 MG tablet Take 1 tablet (25 mg total) by mouth 2 (two) times daily. 180 tablet 3  . ranitidine (ZANTAC) 300 MG tablet Take 1 tablet (300 mg total) by mouth at bedtime. 30 tablet 0  . temazepam (RESTORIL) 30 MG capsule Take 1 capsule (30 mg total) by mouth at bedtime as  needed for sleep. 30 capsule 3   No current facility-administered medications on file prior to visit.     Current Medications (verified) Current Outpatient Prescriptions  Medication Sig Dispense Refill  . cyclobenzaprine (FLEXERIL) 10 MG tablet Take 1 tablet (10 mg total) by mouth 3 (three) times daily as needed for muscle spasms. 30 tablet 1  . Fluoxetine HCl, PMDD, 20 MG CAPS Take 1 capsule (20 mg total) by mouth daily. 30 each 4  . gabapentin (NEURONTIN) 300 MG capsule Take 1 capsule (300 mg total) by mouth 2 (two) times daily. 60 capsule 2  . losartan (COZAAR) 100 MG tablet Take 1 tablet (100 mg total) by mouth daily. 30 tablet 4  . metFORMIN (GLUCOPHAGE) 500 MG tablet Take 1 tablet (500 mg total) by mouth 2 (two) times daily with a meal. 60 tablet 4  . metoprolol tartrate (LOPRESSOR) 25 MG tablet Take 1 tablet (25 mg total) by mouth 2 (two) times daily. 180 tablet 3  . phentermine (ADIPEX-P) 37.5 MG tablet Take 37.5 mg by mouth daily.    . ranitidine (ZANTAC) 300 MG tablet Take 1 tablet (300 mg total) by mouth at bedtime. 30 tablet 0  . temazepam (RESTORIL) 30 MG capsule Take 1 capsule (30 mg total) by mouth at bedtime as needed for sleep. 30 capsule 3   No current facility-administered medications for this visit.      Allergies (verified) Aleve [naproxen sodium]; Penicillins; Adhesive [tape]; and Latex  PAST HISTORY  Family History Family History  Problem Relation Age of Onset  . Diabetes Mother   . Diabetes Father     emphsema   . Hyperlipidemia Father   . Colon cancer Neg Hx     Social History Social History  Substance Use Topics  . Smoking status: Never Smoker  . Smokeless tobacco: Never Used  . Alcohol use No     Are there smokers in your home (other than you)? No  Risk Factors Current exercise habits: Exercise is limited by orthopedic condition(s): bilateral knee pain.  Dietary issues discussed: portion sizes and upcoming changes with food labels     Cardiac risk factors: diabetes mellitus, hypertension, obesity (BMI >= 30 kg/m2) and sedentary lifestyle.  Depression Screen (Note: if answer to either of the following is "Yes", a more complete depression screening is indicated)   Over the past two weeks, have you felt down, depressed or hopeless? No  Over the past two weeks, have you felt little interest or pleasure in doing things? No  Have you lost interest or pleasure in daily life? No  Do you often feel hopeless? No  Do you cry easily over simple problems? No  Activities of Daily Living In your present state of health, do you have any difficulty performing the following activities?:  Driving? No Managing money?  No Feeding yourself? No Getting from bed to chair? No   Climbing a flight of stairs? Yes Preparing food and eating?: No Bathing or showering? No Getting dressed: No Getting to the toilet? No Using the toilet:No Moving around from place to place: No In the past year have you fallen or had a near fall?:No   Are you sexually active?  Yes  Do you have more than one partner?  No  Hearing Difficulties: No Do you often ask people to speak up or repeat themselves? No Do you experience ringing or noises in your ears? No Do you have difficulty understanding soft or whispered voices? No   Do you feel that you have a problem with memory? No  Do you often misplace items? No  Do you feel safe at home?  Yes  Cognitive Testing  Alert? Yes  Normal Appearance?Yes  Oriented to person? Yes  Place? Yes   Time? Yes  Recall of three objects?  Yes  Can perform simple calculations? Yes  Displays appropriate judgment?Yes  Can read the correct time from a watch face?Yes   Advanced Directives have been discussed with the patient? Yes  List the Names of Other Physician/Practitioners you currently use: 1.   Dr. [name] ( bariatric surgeon) 2.   [facility]  3.   Dr. Burton Apley   Indicate any recent  Medical Services you may have received from other than [facility] providers in the past year (date may be approximate).  Immunization History  Administered Date(s) Administered  . Influenza Split 01/16/2012  . Influenza Whole 02/23/2007, 12/07/2008  . Influenza,inj,Quad PF,36+ Mos 12/07/2012, 02/15/2014, 11/28/2014  . Pneumococcal Polysaccharide-23 07/31/2015  . Td 10/23/2005    Screening Tests Health Maintenance  Topic Date Due  . OPHTHALMOLOGY EXAM  12/07/1972  . INFLUENZA VACCINE  10/17/2015  . TETANUS/TDAP  11/25/2017 (Originally 10/24/2015)  . HEMOGLOBIN A1C  01/28/2016  . FOOT EXAM  07/30/2016  . MAMMOGRAM  07/18/2017  . PAP SMEAR  04/05/2018  . PNEUMOCOCCAL POLYSACCHARIDE VACCINE (2) 07/30/2020  . COLONOSCOPY  03/04/2023  . Hepatitis C Screening  Completed  . HIV Screening  Completed    All answers were reviewed with the patient and necessary referrals were made:  Durwin Nora, LPN   [id number]   History reviewed: allergies, current medications, past family history, past medical history, past social history, past surgical history and problem list  Review of Systems A comprehensive review of systems was negative.    Objective:     Vision by Snellen chart: right eye:20/20, left eye:20/20  There is no height or weight on file to calculate BMI. There were no vitals taken for this visit.  No exam performed today, annual wellness without physical exam.     Assessment:     Medicare annual wellness visit, subsequent Annual exam as documented. Counseling done  re healthy lifestyle involving commitment to 150 minutes exercise per week, heart healthy diet, and attaining healthy weight.The importance of adequate sleep also discussed. Regular seat belt use and home safety, is also discussed. Changes in health habits are decided on by the patient with goals and time frames  set for achieving them. Immunization and cancer screening needs are specifically addressed  at this visit.   Need for prophylactic vaccination and inoculation against influenza After obtaining informed consent, the vaccine is  administered by LPN.      Plan:     During the course of the visit the patient was educated and counseled about appropriate screening and preventive services including:    Influenza vaccine  Diet review for nutrition referral? Yes ____  Not Indicated ____   Patient Instructions (the written plan) was given to the patient.  Medicare Attestation I have personally reviewed: The patient's medical and social history Their use of alcohol, tobacco or illicit drugs Their current medications and supplements The patient's functional ability including ADLs,fall risks, home safety risks, cognitive, and hearing and visual impairment Diet and physical activities Evidence for depression or mood disorders  The patient's weight, height, BMI, and visual acuity have been recorded in the chart.  I have made referrals, counseling, and provided education to the patient based on review of the above and I have provided the patient with a written personalized care plan for preventive services.     Kandis Fantasia [place], [place]   [id number]

## 2016-01-09 NOTE — Assessment & Plan Note (Signed)

## 2016-01-09 NOTE — Assessment & Plan Note (Signed)
After obtaining informed consent, the vaccine is  administered by LPN.  

## 2016-01-23 ENCOUNTER — Other Ambulatory Visit: Payer: Self-pay | Admitting: Family Medicine

## 2016-03-05 ENCOUNTER — Other Ambulatory Visit: Payer: Self-pay | Admitting: Family Medicine

## 2016-03-06 DIAGNOSIS — E119 Type 2 diabetes mellitus without complications: Secondary | ICD-10-CM | POA: Diagnosis not present

## 2016-03-06 DIAGNOSIS — L03311 Cellulitis of abdominal wall: Secondary | ICD-10-CM | POA: Diagnosis not present

## 2016-03-06 DIAGNOSIS — Z7984 Long term (current) use of oral hypoglycemic drugs: Secondary | ICD-10-CM | POA: Diagnosis not present

## 2016-03-06 DIAGNOSIS — I10 Essential (primary) hypertension: Secondary | ICD-10-CM | POA: Diagnosis not present

## 2016-03-06 DIAGNOSIS — Z79899 Other long term (current) drug therapy: Secondary | ICD-10-CM | POA: Diagnosis not present

## 2016-05-14 ENCOUNTER — Ambulatory Visit: Payer: Self-pay | Admitting: Family Medicine

## 2016-06-12 ENCOUNTER — Ambulatory Visit (INDEPENDENT_AMBULATORY_CARE_PROVIDER_SITE_OTHER): Payer: Medicare Other | Admitting: Family Medicine

## 2016-06-12 ENCOUNTER — Encounter: Payer: Self-pay | Admitting: Family Medicine

## 2016-06-12 VITALS — BP 128/86 | HR 80 | Temp 98.9°F | Resp 18 | Ht 62.0 in | Wt 362.1 lb

## 2016-06-12 DIAGNOSIS — J069 Acute upper respiratory infection, unspecified: Secondary | ICD-10-CM

## 2016-06-12 MED ORDER — PREDNISONE 20 MG PO TABS: 20.0000 mg | ORAL_TABLET | Freq: Two times a day (BID) | ORAL | 0 refills | Status: DC

## 2016-06-12 MED ORDER — AZITHROMYCIN 250 MG PO TABS: ORAL_TABLET | ORAL | 0 refills | Status: DC

## 2016-06-12 MED ORDER — BENZONATATE 100 MG PO CAPS: 100.0000 mg | ORAL_CAPSULE | Freq: Three times a day (TID) | ORAL | 0 refills | Status: DC | PRN

## 2016-06-12 NOTE — Patient Instructions (Signed)
Take the antibiotic azithromycin as directed Take 5 days of prednisone for inflammation Take the tessalon as needed for cough Drink lots of water Call if not improving in a few days

## 2016-06-12 NOTE — Progress Notes (Signed)
Chief Complaint  Patient presents with  . Cough    x 1 month   Cough for 4 weeks Thought it was getting better but last 5 days has had increased sputum and fever Fatigue No sinus drainage or cold symptoms No asthma/COPD/smoking  Patient Active Problem List   Diagnosis Date Noted  . Back pain with sciatica 11/13/2015  . Diabetes mellitus type 2 in obese (HCC) 07/31/2015  . Palpitations 04/26/2015  . Medicare annual wellness visit, subsequent 06/14/2014  . Need for prophylactic vaccination and inoculation against influenza 03/15/2014  . Urinary incontinence 03/15/2014  . Knee osteoarthritis 07/13/2013  . Chronic diastolic heart failure (HCC) 01/06/2013  . Snoring disorder 01/03/2013  . Thoracic spine pain 12/29/2012  . Vitamin D deficiency 12/08/2012  . GERD (gastroesophageal reflux disease) 07/15/2012  . Exercise intolerance 11/26/2011  . Essential hypertension 11/26/2011  . Depression   . 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 Anemia 07/03/2011  . Morbid obesity (HCC) 12/03/2007    Outpatient Encounter Prescriptions as of 06/12/2016  Medication Sig  . ibuprofen (ADVIL,MOTRIN) 800 MG tablet TAKE 1 TABLET (800 MG TOTAL) BY MOUTH EVERY 8 (EIGHT) HOURS AS NEEDED.  Marland Kitchen azithromycin (ZITHROMAX) 250 MG tablet Tad  . benzonatate (TESSALON) 100 MG capsule Take 1 capsule (100 mg total) by mouth 3 (three) times daily as needed for cough.  . predniSONE (DELTASONE) 20 MG tablet Take 1 tablet (20 mg total) by mouth 2 (two) times daily with a meal.   No facility-administered encounter medications on file as of 06/12/2016.     Allergies  Allergen Reactions  . Aleve [Naproxen Sodium] Shortness Of Breath  . Penicillins Swelling    Facial Area   . Adhesive [Tape]     rash  . Latex Rash    Review of Systems  Constitutional: Positive for fatigue and fever. Negative for chills.  HENT: Negative for congestion, postnasal drip, rhinorrhea, sinus pain and sinus pressure.   Eyes:  Negative for redness and visual disturbance.  Respiratory: Positive for cough. Negative for shortness of breath.   Cardiovascular: Negative for chest pain and palpitations.  Gastrointestinal: Negative for nausea and vomiting.  Musculoskeletal: Negative for arthralgias and back pain.  Neurological: Negative for dizziness and headaches.    BP 128/86 (BP Location: Right Wrist, Patient Position: Sitting, Cuff Size: Normal)   Pulse 80   Temp 98.9 F (37.2 C) (Temporal)   Resp 18   Ht 5\' 2"  (1.575 m)   Wt (!) 362 lb 1.3 oz (164.2 kg)   SpO2 96%   BMI 66.23 kg/m   Physical Exam  Constitutional: She is oriented to person, place, and time. She appears well-developed and well-nourished. No distress.  Super obese  HENT:  Head: Normocephalic and atraumatic.  Right Ear: External ear normal.  Left Ear: External ear normal.  Mouth/Throat: Oropharynx is clear and moist.  Eyes: Conjunctivae are normal. Pupils are equal, round, and reactive to light.  Neck: Normal range of motion.  Cardiovascular: Normal rate and regular rhythm.   Pulmonary/Chest: Effort normal and breath sounds normal. She has no rales.  Lungs clear  Lymphadenopathy:    She has no cervical adenopathy.  Neurological: She is alert and oriented to person, place, and time.  Psychiatric: She has a normal mood and affect. Her behavior is normal.    ASSESSMENT/PLAN:  1. URI with cough and congestion Likely started as a virus - may still be bronchial irritation from vital bronchitis, but will give trial of z  pak, prednisone to help with persistent symptoms   Patient Instructions  Take the antibiotic azithromycin as directed Take 5 days of prednisone for inflammation Take the tessalon as needed for cough Drink lots of water Call if not improving in a few days   Eustace Moore, MD

## 2016-09-03 ENCOUNTER — Ambulatory Visit (INDEPENDENT_AMBULATORY_CARE_PROVIDER_SITE_OTHER): Payer: Medicare Other | Admitting: Family Medicine

## 2016-09-03 ENCOUNTER — Encounter: Payer: Self-pay | Admitting: Family Medicine

## 2016-09-03 VITALS — BP 170/108 | HR 72 | Temp 97.3°F | Resp 18 | Ht 62.0 in | Wt 365.0 lb

## 2016-09-03 DIAGNOSIS — Z9114 Patient's other noncompliance with medication regimen: Secondary | ICD-10-CM

## 2016-09-03 DIAGNOSIS — E669 Obesity, unspecified: Secondary | ICD-10-CM | POA: Diagnosis not present

## 2016-09-03 DIAGNOSIS — E1169 Type 2 diabetes mellitus with other specified complication: Secondary | ICD-10-CM | POA: Diagnosis not present

## 2016-09-03 DIAGNOSIS — M5412 Radiculopathy, cervical region: Secondary | ICD-10-CM | POA: Diagnosis not present

## 2016-09-03 DIAGNOSIS — I10 Essential (primary) hypertension: Secondary | ICD-10-CM

## 2016-09-03 MED ORDER — METHYLPREDNISOLONE 4 MG PO TBPK: ORAL_TABLET | ORAL | 0 refills | Status: DC

## 2016-09-03 MED ORDER — LISINOPRIL-HYDROCHLOROTHIAZIDE 10-12.5 MG PO TABS: 1.0000 | ORAL_TABLET | Freq: Every day | ORAL | 0 refills | Status: DC

## 2016-09-03 MED ORDER — GABAPENTIN 300 MG PO CAPS: 300.0000 mg | ORAL_CAPSULE | Freq: Three times a day (TID) | ORAL | 0 refills | Status: DC

## 2016-09-03 NOTE — Patient Instructions (Signed)
Take the medrol pak as instructed Take all of day one today Take the gabapentin at night If it does not cause you to feel sleepy, you may take 3 times a day Go back on your BP pills  Come back for blood work in 2-3 weeks See Dr Lodema Hong for a regular check up at her next available appointment

## 2016-09-03 NOTE — Progress Notes (Signed)
Chief Complaint  Patient presents with  . Arm Pain    right x 1 month  Arm pain for one month. History of cervical disc disease and surgery many years ago At that time had left arm symptoms - similar No injury or trauma She does more lifting and pushing/pulling lately because mother is bed ridden Pain goes from lower R neck down the arm to the top of the hand Has weakness and arm feels clumsy No numbness No headache Keeps her awake at night  ALSO: is reminded that she has NOT seen her PCP for routine medical care for over a year.  She has stopped her own medicines and is NOT taking her diabetes or BP medicine.  Her BP is very high.  She has not checked  Her sugar.  I explained that she is putting herself at significant risk of complications by being non compliant   Patient Active Problem List   Diagnosis Date Noted  . Back pain with sciatica 11/13/2015  . Diabetes mellitus type 2 in obese (HCC) 07/31/2015  . Palpitations 04/26/2015  . Medicare annual wellness visit, subsequent 06/14/2014  . Need for prophylactic vaccination and inoculation against influenza 03/15/2014  . Urinary incontinence 03/15/2014  . Knee osteoarthritis 07/13/2013  . Chronic diastolic heart failure (HCC) 01/06/2013  . Snoring disorder 01/03/2013  . Thoracic spine pain 12/29/2012  . Vitamin D deficiency 12/08/2012  . GERD (gastroesophageal reflux disease) 07/15/2012  . Exercise intolerance 11/26/2011  . Essential hypertension 11/26/2011  . Depression   . 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 Anemia 07/03/2011  . Morbid obesity (HCC) 12/03/2007    Outpatient Encounter Prescriptions as of 09/03/2016  Medication Sig  . ibuprofen (ADVIL,MOTRIN) 800 MG tablet TAKE 1 TABLET (800 MG TOTAL) BY MOUTH EVERY 8 (EIGHT) HOURS AS NEEDED.  Marland Kitchen gabapentin (NEURONTIN) 300 MG capsule Take 1 capsule (300 mg total) by mouth 3 (three) times daily.  Marland Kitchen lisinopril-hydrochlorothiazide (PRINZIDE,ZESTORETIC) 10-12.5 MG  tablet Take 1 tablet by mouth daily.  . methylPREDNISolone (MEDROL DOSEPAK) 4 MG TBPK tablet tad   No facility-administered encounter medications on file as of 09/03/2016.     Allergies  Allergen Reactions  . Aleve [Naproxen Sodium] Shortness Of Breath  . Penicillins Swelling    Facial Area   . Adhesive [Tape]     rash  . Latex Rash    Review of Systems  Constitutional: Negative for activity change, appetite change and unexpected weight change.  HENT: Negative for congestion, dental problem, postnasal drip and rhinorrhea.   Eyes: Negative for redness and visual disturbance.  Respiratory: Negative for cough and shortness of breath.   Cardiovascular: Positive for leg swelling. Negative for chest pain and palpitations.  Gastrointestinal: Negative for abdominal pain, constipation and diarrhea.  Genitourinary: Positive for frequency. Negative for difficulty urinating.  Musculoskeletal: Positive for neck pain and neck stiffness. Negative for arthralgias and back pain.  Neurological: Positive for weakness. Negative for dizziness and headaches.  Psychiatric/Behavioral: Positive for sleep disturbance. Negative for dysphoric mood. The patient is not nervous/anxious.     BP (!) 170/108 (BP Location: Left Arm, Patient Position: Sitting, Cuff Size: Large)   Pulse 72   Temp 97.3 F (36.3 C) (Temporal)   Resp 18   Ht 5\' 2"  (1.575 m)   Wt (!) 365 lb 0.6 oz (165.6 kg)   SpO2 97%   BMI 66.77 kg/m   Physical Exam  Constitutional: She is oriented to person, place, and time. She appears well-developed and  well-nourished.  Morbid obesity  HENT:  Head: Normocephalic and atraumatic.  Mouth/Throat: Oropharynx is clear and moist.  Eyes: Conjunctivae are normal. Pupils are equal, round, and reactive to light.  Neck: No thyromegaly present.  Tender R neck and upper trap muscles  Cardiovascular: Normal rate, regular rhythm and normal heart sounds.   Pulmonary/Chest: Effort normal and breath  sounds normal. No respiratory distress.  Abdominal: Soft. Bowel sounds are normal.  Musculoskeletal: Normal range of motion. She exhibits no edema.  Pain with right arm movement, but reflexes and sensation intact  Lymphadenopathy:    She has no cervical adenopathy.  Neurological: She is alert and oriented to person, place, and time.  Gait normal  Skin: Skin is warm and dry.  Psychiatric: She has a normal mood and affect. Her behavior is normal. Thought content normal.  Nursing note and vitals reviewed.   ASSESSMENT/PLAN:  1. Cervical radiculopathy at C7 Likely from known cervical degenerative disc disease  2. Diabetes mellitus type 2 in obese (HCC)  - COMPLETE METABOLIC PANEL WITH GFR - Hemoglobin A1c - CBC - Urinalysis, Routine w reflex microscopic - TSH - Lipid panel - VITAMIN D 25 Hydroxy (Vit-D Deficiency, Fractures)   Patient Instructions  Take the medrol pak as instructed Take all of day one today Take the gabapentin at night If it does not cause you to feel sleepy, you may take 3 times a day Go back on your BP pills  Come back for blood work in 2-3 weeks See Dr Lodema Hong for a regular check up at her next available appointment   Eustace Moore, MD

## 2016-09-26 ENCOUNTER — Other Ambulatory Visit: Payer: Self-pay | Admitting: Family Medicine

## 2016-09-26 DIAGNOSIS — Z1231 Encounter for screening mammogram for malignant neoplasm of breast: Secondary | ICD-10-CM

## 2016-09-27 ENCOUNTER — Ambulatory Visit (HOSPITAL_COMMUNITY)
Admission: RE | Admit: 2016-09-27 | Discharge: 2016-09-27 | Disposition: A | Discharge status: Home / Self Care | Payer: Medicare Other | Source: Ambulatory Visit | Attending: Family Medicine | Admitting: Family Medicine

## 2016-09-27 DIAGNOSIS — Z1231 Encounter for screening mammogram for malignant neoplasm of breast: Secondary | ICD-10-CM | POA: Insufficient documentation

## 2016-09-30 ENCOUNTER — Other Ambulatory Visit: Payer: Self-pay | Admitting: Family Medicine

## 2016-10-02 ENCOUNTER — Encounter: Payer: Self-pay | Admitting: Family Medicine

## 2016-10-02 ENCOUNTER — Other Ambulatory Visit: Payer: Self-pay | Admitting: Family Medicine

## 2016-10-02 ENCOUNTER — Other Ambulatory Visit (HOSPITAL_COMMUNITY)
Admission: RE | Admit: 2016-10-02 | Discharge: 2016-10-02 | Disposition: A | Discharge status: Home / Self Care | Payer: Medicare Other | Source: Ambulatory Visit | Attending: Family Medicine | Admitting: Family Medicine

## 2016-10-02 ENCOUNTER — Ambulatory Visit (INDEPENDENT_AMBULATORY_CARE_PROVIDER_SITE_OTHER): Payer: Medicare Other | Admitting: Family Medicine

## 2016-10-02 ENCOUNTER — Telehealth: Payer: Self-pay | Admitting: Family Medicine

## 2016-10-02 VITALS — BP 140/84 | HR 70 | Resp 16 | Ht 62.0 in | Wt 370.0 lb

## 2016-10-02 DIAGNOSIS — N760 Acute vaginitis: Secondary | ICD-10-CM | POA: Diagnosis not present

## 2016-10-02 DIAGNOSIS — E559 Vitamin D deficiency, unspecified: Secondary | ICD-10-CM | POA: Diagnosis not present

## 2016-10-02 DIAGNOSIS — R350 Frequency of micturition: Secondary | ICD-10-CM

## 2016-10-02 DIAGNOSIS — N3001 Acute cystitis with hematuria: Secondary | ICD-10-CM | POA: Diagnosis not present

## 2016-10-02 DIAGNOSIS — B9689 Other specified bacterial agents as the cause of diseases classified elsewhere: Secondary | ICD-10-CM | POA: Diagnosis not present

## 2016-10-02 DIAGNOSIS — I10 Essential (primary) hypertension: Secondary | ICD-10-CM

## 2016-10-02 DIAGNOSIS — E669 Obesity, unspecified: Secondary | ICD-10-CM

## 2016-10-02 DIAGNOSIS — Z9889 Other specified postprocedural states: Secondary | ICD-10-CM

## 2016-10-02 DIAGNOSIS — E890 Postprocedural hypothyroidism: Secondary | ICD-10-CM

## 2016-10-02 DIAGNOSIS — E1169 Type 2 diabetes mellitus with other specified complication: Secondary | ICD-10-CM

## 2016-10-02 DIAGNOSIS — F331 Major depressive disorder, recurrent, moderate: Secondary | ICD-10-CM

## 2016-10-02 DIAGNOSIS — M170 Bilateral primary osteoarthritis of knee: Secondary | ICD-10-CM

## 2016-10-02 DIAGNOSIS — R7303 Prediabetes: Secondary | ICD-10-CM | POA: Diagnosis not present

## 2016-10-02 DIAGNOSIS — M5412 Radiculopathy, cervical region: Secondary | ICD-10-CM | POA: Diagnosis not present

## 2016-10-02 DIAGNOSIS — A5901 Trichomonal vulvovaginitis: Secondary | ICD-10-CM | POA: Diagnosis not present

## 2016-10-02 LAB — CBC
HCT: 39.1 % (ref 35.0–45.0)
Hemoglobin: 11.9 g/dL (ref 11.7–15.5)
MCH: 23.4 pg — AB (ref 27.0–33.0)
MCHC: 30.4 g/dL — AB (ref 32.0–36.0)
MCV: 76.8 fL — ABNORMAL LOW (ref 80.0–100.0)
MPV: 9.4 fL (ref 7.5–12.5)
PLATELETS: 363 10*3/uL (ref 140–400)
RBC: 5.09 MIL/uL (ref 3.80–5.10)
RDW: 15.1 % — AB (ref 11.0–15.0)
WBC: 7.1 10*3/uL (ref 3.8–10.8)

## 2016-10-02 LAB — LIPID PANEL
Cholesterol: 155 mg/dL (ref <200)
HDL: 51 mg/dL (ref >50)
LDL Cholesterol: 92 mg/dL (ref <100)
Total CHOL/HDL Ratio: 3 Ratio (ref <5.0)
Triglycerides: 61 mg/dL (ref <150)
VLDL: 12 mg/dL (ref <30)

## 2016-10-02 LAB — POCT URINALYSIS DIPSTICK
Bilirubin, UA: NEGATIVE
GLUCOSE UA: NEGATIVE
Ketones, UA: NEGATIVE
NITRITE UA: NEGATIVE
PH UA: 6 (ref 5.0–8.0)
PROTEIN UA: NEGATIVE
SPEC GRAV UA: 1.02 (ref 1.010–1.025)
UROBILINOGEN UA: 0.2 U/dL

## 2016-10-02 LAB — URINALYSIS, ROUTINE W REFLEX MICROSCOPIC
BILIRUBIN URINE: NEGATIVE
Glucose, UA: NEGATIVE
Ketones, ur: NEGATIVE
Nitrite: NEGATIVE
PH: 6 (ref 5.0–8.0)
PROTEIN: NEGATIVE
Specific Gravity, Urine: 1.02 (ref 1.001–1.035)

## 2016-10-02 LAB — COMPLETE METABOLIC PANEL WITH GFR
ALT: 7 U/L (ref 6–29)
AST: 11 U/L (ref 10–35)
Albumin: 3.5 g/dL — ABNORMAL LOW (ref 3.6–5.1)
Alkaline Phosphatase: 66 U/L (ref 33–130)
BUN: 13 mg/dL (ref 7–25)
CHLORIDE: 104 mmol/L (ref 98–110)
CO2: 29 mmol/L (ref 20–31)
CREATININE: 0.71 mg/dL (ref 0.50–1.05)
Calcium: 8.7 mg/dL (ref 8.6–10.4)
Glucose, Bld: 99 mg/dL (ref 65–99)
Potassium: 4.3 mmol/L (ref 3.5–5.3)
Sodium: 140 mmol/L (ref 135–146)
Total Bilirubin: 0.6 mg/dL (ref 0.2–1.2)
Total Protein: 6.8 g/dL (ref 6.1–8.1)

## 2016-10-02 LAB — URINALYSIS, MICROSCOPIC ONLY
Casts: NONE SEEN [LPF]
Crystals: NONE SEEN [HPF]
Yeast: NONE SEEN [HPF]

## 2016-10-02 LAB — TSH: TSH: 5.24 mIU/L — ABNORMAL HIGH

## 2016-10-02 MED ORDER — PHENTERMINE HCL 37.5 MG PO TABS: 37.5000 mg | ORAL_TABLET | Freq: Every day | ORAL | 1 refills | Status: DC

## 2016-10-02 MED ORDER — GABAPENTIN 300 MG PO CAPS: 300.0000 mg | ORAL_CAPSULE | Freq: Three times a day (TID) | ORAL | 3 refills | Status: DC

## 2016-10-02 MED ORDER — FLUCONAZOLE 150 MG PO TABS: ORAL_TABLET | ORAL | 0 refills | Status: DC

## 2016-10-02 MED ORDER — VENLAFAXINE HCL ER 75 MG PO CP24: 75.0000 mg | ORAL_CAPSULE | Freq: Every day | ORAL | 1 refills | Status: DC

## 2016-10-02 MED ORDER — IBUPROFEN 800 MG PO TABS: 800.0000 mg | ORAL_TABLET | Freq: Three times a day (TID) | ORAL | 0 refills | Status: DC | PRN

## 2016-10-02 MED ORDER — TEMAZEPAM 30 MG PO CAPS: 30.0000 mg | ORAL_CAPSULE | Freq: Every evening | ORAL | 2 refills | Status: DC | PRN

## 2016-10-02 MED ORDER — HYDROCHLOROTHIAZIDE 25 MG PO TABS: 25.0000 mg | ORAL_TABLET | Freq: Every day | ORAL | 1 refills | Status: DC

## 2016-10-02 MED ORDER — POTASSIUM CHLORIDE CRYS ER 20 MEQ PO TBCR: 20.0000 meq | EXTENDED_RELEASE_TABLET | Freq: Every day | ORAL | 1 refills | Status: DC

## 2016-10-02 NOTE — Telephone Encounter (Signed)
Called patient regarding message below. No answer, left generic message for patient to return call.   

## 2016-10-02 NOTE — Telephone Encounter (Signed)
New Message  Pt voiced wanting nurse to give her a call back.  Please f/u

## 2016-10-02 NOTE — Assessment & Plan Note (Signed)
1 month h/o nocturia and vaginal itch, will treat presumptively for yeast and f/u culture

## 2016-10-02 NOTE — Telephone Encounter (Signed)
Patient states she was supposed to be getting rx phentermine, but did not see it on her check out paper. Is patient getting this med?

## 2016-10-02 NOTE — Patient Instructions (Addendum)
Annual physical exam in 6 to 8 weeks, call if you need me before  Medications as discussed  For hypertension, arthritis, yeast infection, depression and sleep.  Please go to the out ptyt psychiatry office near the [facility] today, we will send my chart message  Please work on good  health habits so that your health will improve. 1. Commitment to daily physical activity for 30 to 60  minutes, if you are able to do this.  2. Commitment to wise food choices. Aim for half of your  food intake to be vegetable and fruit, one quarter starchy foods, and one quarter protein. Try to eat on a regular schedule  3 meals per day, snacking between meals should be limited to vegetables or fruits or small portions of nuts. 64 ounces of water per day is generally recommended, unless you have specific health conditions, like heart failure or kidney failure where you will need to limit fluid intake.  3. Commitment to sufficient and a  good quality of physical and mental rest daily, generally between 6 to 8 hours per day.  WITH PERSISTANCE AND PERSEVERANCE, THE IMPOSSIBLE , BECOMES THE NORM! It is important that you exercise regularly at least 30 minutes 5 times a week. If you develop chest pain, have severe difficulty breathing, or feel very tired, stop exercising immediately and seek medical attention

## 2016-10-02 NOTE — Telephone Encounter (Signed)
Patient informed of message below, verbalized understanding.  

## 2016-10-02 NOTE — Telephone Encounter (Signed)
Printed , pls fax and send , remind her to take hALF daily tho states one daily

## 2016-10-03 ENCOUNTER — Encounter: Payer: Self-pay | Admitting: Family Medicine

## 2016-10-03 LAB — URINE CYTOLOGY ANCILLARY ONLY
CHLAMYDIA, DNA PROBE: NEGATIVE
Neisseria Gonorrhea: NEGATIVE
Trichomonas: POSITIVE — AB

## 2016-10-03 LAB — HSV 2 ANTIBODY, IGG: HSV 2 Glycoprotein G Ab, IgG: 15.4 Index — ABNORMAL HIGH (ref <0.90)

## 2016-10-03 LAB — HEMOGLOBIN A1C
HEMOGLOBIN A1C: 5.8 % — AB (ref <5.7)
Mean Plasma Glucose: 120 mg/dL

## 2016-10-03 LAB — VITAMIN D 25 HYDROXY (VIT D DEFICIENCY, FRACTURES): VIT D 25 HYDROXY: 12 ng/mL — AB (ref 30–100)

## 2016-10-03 LAB — URINE CULTURE: Organism ID, Bacteria: NO GROWTH

## 2016-10-06 ENCOUNTER — Other Ambulatory Visit: Payer: Self-pay | Admitting: Family Medicine

## 2016-10-06 NOTE — Assessment & Plan Note (Signed)
Uncontrolled, needs to resume medication  DASH diet and commitment to daily physical activity for a minimum of 30 minutes discussed and encouraged, as a part of hypertension management. The importance of attaining a healthy weight is also discussed.  BP/Weight 10/02/2016 09/03/2016 06/12/2016 01/09/2016 11/13/2015 11/01/2015 07/31/2015  Systolic BP 140 170 128 122 124 128 142  Diastolic BP 84 108 86 72 84 82 82  Wt. (Lbs) 370 365.04 362.08 372 387 387 386  BMI 67.67 66.77 66.23 68.04 70.78 70.78 70.58

## 2016-10-06 NOTE — Assessment & Plan Note (Signed)
Updated lab needed.  

## 2016-10-06 NOTE — Assessment & Plan Note (Signed)
Deteriorated. Patient re-educated about  the importance of commitment to a  minimum of 150 minutes of exercise per week.  The importance of healthy food choices with portion control discussed. Encouraged to start a food diary, count calories and to consider  joining a support group. Sample diet sheets offered. Goals set by the patient for the next several months.   Weight /BMI 10/02/2016 09/03/2016 06/12/2016  WEIGHT 370 lb 365 lb 0.6 oz 362 lb 1.3 oz  HEIGHT 5\' 2"  5\' 2"  5\' 2"   BMI 67.67 kg/m2 66.77 kg/m2 66.23 kg/m2  deteriorated , start half phentermine daily with lifestyle modification if thyroid function is normal

## 2016-10-06 NOTE — Assessment & Plan Note (Signed)
Uncontrolled, not suicidal or homicidal, recommend counselling Start medication also

## 2016-10-06 NOTE — Progress Notes (Signed)
[name]     MRN: [id number]      DOB: 01-10-1961   HPI [name] is here for follow up, to re establish care after 1 year hiatus, when she effectively discontinued all chronic meds, stating she was preoccupied caring for her mother. and re-evaluation of chronic medical conditions, medication management and review of any available recent lab and radiology data.  Preventive health is updated, specifically  Cancer screening and Immunization.   Commits to gababapentin for chronic neck pain with radiculopathy. Reports intolerance to antihypertensive recently started and has d/c same Wants help with obesity, had planned to have bariatric surgery, but lost a friend following this surgery in December and became discouraged   ROS Denies recent fever or chills. Denies sinus pressure, nasal congestion, ear pain or sore throat. Denies chest congestion, productive cough or wheezing. Denies chest pains, palpitations and leg swelling Denies abdominal pain, nausea, vomiting,diarrhea or constipation.    Denies headaches, seizures, numbness, or tingling. C/o  depression, anxiety or insomnia. Denies skin break down or rash.   PE  BP 140/84   Pulse 70   Resp 16   Ht 5\' 2"  (1.575 m)   Wt (!) 370 lb (167.8 kg)   SpO2 97%   BMI 67.67 kg/m   Patient alert and oriented and in no cardiopulmonary distress.  HEENT: No facial asymmetry, EOMI,   oropharynx pink and moist.  Neck supple no JVD, no mass.  Chest: Clear to auscultation bilaterally.  CVS: S1, S2 no murmurs, no S3.Regular rate.  ABD: Soft non tender.   Ext: No edema  MS: decreased  ROM spine, shoulders, hips and knees.  Skin: Intact, no ulcerations or rash noted.  Psych: Good eye contact, normal affect. Memory intact mildly  anxious , tearful and depressed appearing.  CNS: CN 2-12 intact, power,  normal throughout.no focal deficits noted.   Assessment & Plan  Urinary frequency 1 month h/o nocturia and vaginal  itch, will treat presumptively for yeast and f/u culture   Prediabetes Patient educated about the importance of limiting  Carbohydrate intake , the need to commit to daily physical activity for a minimum of 30 minutes , and to commit weight loss. The fact that changes in all these areas will reduce or eliminate all together the development of diabetes is stressed.   Diabetic Labs Latest Ref Rng & Units 10/02/2016 07/31/2015 07/28/2015 04/26/2015 11/25/2014  HbA1c <5.7 % 5.8(H) - 6.1(H) - 6.6(H)  Microalbumin Not estab mg/dL - 0.4 - - -  Micro/Creat Ratio <30 mcg/mg creat - 4 - - -  Chol <200 mg/dL 01/25/2015 - 941 - -  HDL 740 mg/dL 51 - >81) - -  Calc LDL <100 mg/dL 92 - 73 - -  Triglycerides <150 mg/dL 61 - 51 - -  Creatinine 0.50 - 1.05 mg/dL 44(Y - 1.85 6.31 4.97   BP/Weight 10/02/2016 09/03/2016 06/12/2016 01/09/2016 11/13/2015 11/01/2015 07/31/2015  Systolic BP 140 170 128 122 124 128 142  Diastolic BP 84 108 86 72 84 82 82  Wt. (Lbs) 370 365.04 362.08 372 387 387 386  BMI 67.67 66.77 66.23 68.04 70.78 70.78 70.58   Foot/eye exam completion dates 07/31/2015  Foot Form Completion Done      Morbid obesity Deteriorated. Patient re-educated about  the importance of commitment to a  minimum of 150 minutes of exercise per week.  The importance of healthy food choices with portion control discussed. Encouraged to start a food diary, count calories and  to consider  joining a support group. Sample diet sheets offered. Goals set by the patient for the next several months.   Weight /BMI 10/02/2016 09/03/2016 06/12/2016  WEIGHT 370 lb 365 lb 0.6 oz 362 lb 1.3 oz  HEIGHT 5\' 2"  5\' 2"  5\' 2"   BMI 67.67 kg/m2 66.77 kg/m2 66.23 kg/m2  deteriorated , start half phentermine daily with lifestyle modification if thyroid function is normal    Knee osteoarthritis Persistent and worsening with weight gain, needs to lose weight, sparing use of ibuprofen  Essential hypertension Uncontrolled, needs to resume  medication  DASH diet and commitment to daily physical activity for a minimum of 30 minutes discussed and encouraged, as a part of hypertension management. The importance of attaining a healthy weight is also discussed.  BP/Weight 10/02/2016 09/03/2016 06/12/2016 01/09/2016 11/13/2015 11/01/2015 07/31/2015  Systolic BP 140 170 128 122 124 128 142  Diastolic BP 84 108 86 72 84 82 82  Wt. (Lbs) 370 365.04 362.08 372 387 387 386  BMI 67.67 66.77 66.23 68.04 70.78 70.78 70.58       Depression Uncontrolled, not suicidal or homicidal, recommend counselling Start medication also  S/P partial thyroidectomy Updated lab needed  Vitamin D deficiency Updated lab needed

## 2016-10-06 NOTE — Assessment & Plan Note (Signed)
Persistent and worsening with weight gain, needs to lose weight, sparing use of ibuprofen

## 2016-10-06 NOTE — Assessment & Plan Note (Signed)
Patient educated about the importance of limiting  Carbohydrate intake , the need to commit to daily physical activity for a minimum of 30 minutes , and to commit weight loss. The fact that changes in all these areas will reduce or eliminate all together the development of diabetes is stressed.   Diabetic Labs Latest Ref Rng & Units 10/02/2016 07/31/2015 07/28/2015 04/26/2015 11/25/2014  HbA1c <5.7 % 5.8(H) - 6.1(H) - 6.6(H)  Microalbumin Not estab mg/dL - 0.4 - - -  Micro/Creat Ratio <30 mcg/mg creat - 4 - - -  Chol <200 mg/dL 269 - 485 - -  HDL >46 mg/dL 51 - 27(O) - -  Calc LDL <100 mg/dL 92 - 73 - -  Triglycerides <150 mg/dL 61 - 51 - -  Creatinine 0.50 - 1.05 mg/dL 3.50 - 0.93 8.18 2.99   BP/Weight 10/02/2016 09/03/2016 06/12/2016 01/09/2016 11/13/2015 11/01/2015 07/31/2015  Systolic BP 140 170 128 122 124 128 142  Diastolic BP 84 108 86 72 84 82 82  Wt. (Lbs) 370 365.04 362.08 372 387 387 386  BMI 67.67 66.77 66.23 68.04 70.78 70.78 70.58   Foot/eye exam completion dates 07/31/2015  Foot Form Completion Done

## 2016-10-07 LAB — URINE CYTOLOGY ANCILLARY ONLY
Bacterial vaginitis: POSITIVE — AB
CANDIDA VAGINITIS: NEGATIVE

## 2016-10-07 LAB — T4, FREE: FREE T4: 1 ng/dL (ref 0.8–1.8)

## 2016-10-07 LAB — T3, FREE: T3 FREE: 3 pg/mL (ref 2.3–4.2)

## 2016-10-08 ENCOUNTER — Encounter: Payer: Self-pay | Admitting: Family Medicine

## 2016-10-09 ENCOUNTER — Other Ambulatory Visit: Payer: Self-pay | Admitting: Family Medicine

## 2016-10-09 MED ORDER — ACYCLOVIR 400 MG PO TABS: 400.0000 mg | ORAL_TABLET | Freq: Three times a day (TID) | ORAL | 0 refills | Status: DC

## 2016-10-09 MED ORDER — METRONIDAZOLE 500 MG PO TABS: 500.0000 mg | ORAL_TABLET | Freq: Two times a day (BID) | ORAL | 0 refills | Status: DC

## 2016-10-11 MED ORDER — ERGOCALCIFEROL 1.25 MG (50000 UT) PO CAPS: 50000.0000 [IU] | ORAL_CAPSULE | ORAL | 1 refills | Status: DC

## 2016-10-11 NOTE — Addendum Note (Signed)
Addended by: Abner Greenspan on: 10/11/2016 11:04 AM   Modules accepted: Orders

## 2016-10-24 ENCOUNTER — Encounter: Payer: Self-pay | Admitting: *Deleted

## 2016-11-03 ENCOUNTER — Other Ambulatory Visit: Payer: Self-pay | Admitting: Family Medicine

## 2016-11-29 ENCOUNTER — Other Ambulatory Visit: Payer: Self-pay | Admitting: Family Medicine

## 2016-12-04 ENCOUNTER — Ambulatory Visit: Payer: Self-pay

## 2016-12-05 ENCOUNTER — Encounter: Payer: Self-pay | Admitting: Family Medicine

## 2016-12-31 ENCOUNTER — Telehealth: Payer: Self-pay | Admitting: Family Medicine

## 2016-12-31 ENCOUNTER — Encounter: Payer: Self-pay | Admitting: Family Medicine

## 2016-12-31 ENCOUNTER — Other Ambulatory Visit: Payer: Self-pay | Admitting: Family Medicine

## 2016-12-31 MED ORDER — PHENTERMINE HCL 37.5 MG PO TABS: 37.5000 mg | ORAL_TABLET | Freq: Every day | ORAL | 0 refills | Status: DC

## 2016-12-31 MED ORDER — IBUPROFEN 800 MG PO TABS: 800.0000 mg | ORAL_TABLET | Freq: Three times a day (TID) | ORAL | 0 refills | Status: DC | PRN

## 2016-12-31 NOTE — Telephone Encounter (Signed)
Patient was 25 min late today for appt and r/s her appt for cpe, she is requesting a refill for phentermine and 800mg  ibuprofen  [facility] [place]  Cb#: [phone]

## 2016-12-31 NOTE — Telephone Encounter (Signed)
pls refill ibuprofen, phentermine will need to wait until she is seen

## 2016-12-31 NOTE — Telephone Encounter (Signed)
I spoke to the pt and found out the circumstances and have dealt with the refill request

## 2017-02-17 ENCOUNTER — Telehealth (HOSPITAL_COMMUNITY): Payer: Self-pay

## 2017-02-17 NOTE — Telephone Encounter (Signed)
[facility] Follow up Call   Writer left a voice mail message.

## 2017-02-19 ENCOUNTER — Encounter: Payer: Self-pay | Admitting: Family Medicine

## 2017-02-26 ENCOUNTER — Encounter: Payer: Self-pay | Admitting: Family Medicine

## 2017-03-03 ENCOUNTER — Telehealth: Payer: Self-pay | Admitting: Family Medicine

## 2017-03-03 NOTE — Telephone Encounter (Signed)
Pt called and left a msg for [place] to return her call, called and left her a msg at [phone]

## 2017-03-21 ENCOUNTER — Ambulatory Visit (HOSPITAL_COMMUNITY): Payer: Self-pay | Admitting: Psychiatry

## 2017-04-07 ENCOUNTER — Encounter: Payer: Self-pay | Admitting: Family Medicine

## 2017-04-07 ENCOUNTER — Ambulatory Visit (INDEPENDENT_AMBULATORY_CARE_PROVIDER_SITE_OTHER): Payer: Medicare HMO | Admitting: Family Medicine

## 2017-04-07 VITALS — BP 128/82 | HR 60 | Resp 16 | Ht 62.0 in | Wt 365.0 lb

## 2017-04-07 DIAGNOSIS — R7303 Prediabetes: Secondary | ICD-10-CM | POA: Diagnosis not present

## 2017-04-07 DIAGNOSIS — I10 Essential (primary) hypertension: Secondary | ICD-10-CM | POA: Diagnosis not present

## 2017-04-07 DIAGNOSIS — D649 Anemia, unspecified: Secondary | ICD-10-CM

## 2017-04-07 DIAGNOSIS — E559 Vitamin D deficiency, unspecified: Secondary | ICD-10-CM | POA: Diagnosis not present

## 2017-04-07 DIAGNOSIS — Z23 Encounter for immunization: Secondary | ICD-10-CM | POA: Diagnosis not present

## 2017-04-07 DIAGNOSIS — Z13220 Encounter for screening for lipoid disorders: Secondary | ICD-10-CM

## 2017-04-07 DIAGNOSIS — R69 Illness, unspecified: Secondary | ICD-10-CM | POA: Diagnosis not present

## 2017-04-07 DIAGNOSIS — Z0000 Encounter for general adult medical examination without abnormal findings: Secondary | ICD-10-CM

## 2017-04-07 DIAGNOSIS — F331 Major depressive disorder, recurrent, moderate: Secondary | ICD-10-CM

## 2017-04-07 MED ORDER — GABAPENTIN 300 MG PO CAPS: 300.0000 mg | ORAL_CAPSULE | Freq: Two times a day (BID) | ORAL | 5 refills | Status: DC

## 2017-04-07 MED ORDER — HYDROCHLOROTHIAZIDE 25 MG PO TABS: 25.0000 mg | ORAL_TABLET | Freq: Every day | ORAL | 3 refills | Status: DC

## 2017-04-07 MED ORDER — VENLAFAXINE HCL ER 75 MG PO CP24: 75.0000 mg | ORAL_CAPSULE | Freq: Every day | ORAL | 4 refills | Status: DC

## 2017-04-07 MED ORDER — TEMAZEPAM 30 MG PO CAPS: 30.0000 mg | ORAL_CAPSULE | Freq: Every evening | ORAL | 4 refills | Status: DC | PRN

## 2017-04-07 MED ORDER — PHENTERMINE HCL 37.5 MG PO TABS: 37.5000 mg | ORAL_TABLET | Freq: Every day | ORAL | 1 refills | Status: DC

## 2017-04-07 MED ORDER — IBUPROFEN 800 MG PO TABS: ORAL_TABLET | ORAL | 2 refills | Status: DC

## 2017-04-07 NOTE — Patient Instructions (Addendum)
F/U end May , call if you need me sooner  Flu vaccine today Fasting labs as soon as possible  You are in our thoughts at this difficult time  Medications will be reviewed and sent as we discussed  Take HALF phentermine daily

## 2017-04-08 ENCOUNTER — Ambulatory Visit (INDEPENDENT_AMBULATORY_CARE_PROVIDER_SITE_OTHER): Payer: Medicare HMO | Admitting: Psychiatry

## 2017-04-08 ENCOUNTER — Encounter (HOSPITAL_COMMUNITY): Payer: Self-pay | Admitting: Psychiatry

## 2017-04-08 ENCOUNTER — Encounter: Payer: Self-pay | Admitting: Family Medicine

## 2017-04-08 DIAGNOSIS — R69 Illness, unspecified: Secondary | ICD-10-CM | POA: Diagnosis not present

## 2017-04-08 DIAGNOSIS — F331 Major depressive disorder, recurrent, moderate: Secondary | ICD-10-CM | POA: Diagnosis not present
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
## 2017-04-08 NOTE — Progress Notes (Signed)
[name]     MRN: [id number]      DOB: 1960-11-04  HPI: Patient is in for annual physical exam. Unfortunately pt just lost her Mom last week, and she is currently grieving as expected Immunization is reviewed , and  updated . Wants to resume medication to help with weight loss   PE: BP 128/82   Pulse 60   Resp 16   Ht 5\' 2"  (1.575 m)   Wt (!) 365 lb (165.6 kg)   SpO2 97%   BMI 66.76 kg/m   Pleasant  female, alert and oriented x 3, in no cardio-pulmonary distress. Afebrile. HEENT No facial trauma or asymetry. Sinuses non tender.  Extra occullar muscles intact, pupils equally reactive to light. External ears normal, tympanic membranes clear. Oropharynx moist, no exudate. Neck: supple, no adenopathy,JVD or thyromegaly.No bruits.  Chest: Clear to ascultation bilaterally.No crackles or wheezes. Non tender to palpation  Breast: No asymetry,no masses or lumps. No tenderness. No nipple discharge or inversion. No axillary or supraclavicular adenopathy  Cardiovascular system; Heart sounds normal,  S1 and  S2 ,no S3.  No murmur, or thrill. Apical beat not displaced Peripheral pulses normal.  Abdomen: Soft, non tender, no organomegaly or masses. No bruits. Bowel sounds normal. No guarding, tenderness or rebound.  Rectal:  Normal sphincter tone. No rectal mass. Guaiac negative stool.  GU: External genitalia normal female genitalia , normal female distribution of hair. No lesions. Urethral meatus normal in size, no  Prolapse, no lesions visibly  Present. Bladder non tender. Vagina pink and moist , with no visible lesions , discharge present . Adequate pelvic support no  cystocele or rectocele noted Cervix pink and appears healthy, no lesions or ulcerations noted, no discharge noted from os Uterus normal size, no adnexal masses, no cervical motion or adnexal tenderness.   Musculoskeletal exam: Full ROM of spine, hips , shoulders and knees. No deformity  ,swelling or crepitus noted. No muscle wasting or atrophy.   Neurologic: Cranial nerves 2 to 12 intact. Power, tone ,sensation and reflexes normal throughout. No disturbance in gait. No tremor.  Skin: Intact, no ulceration, erythema , scaling or rash noted. Pigmentation normal throughout  Psych; Depressed mood and tearful. Judgement and concentration normal   Assessment & Plan:  Annual physical exam Annual exam as documented. Counseling done  re healthy lifestyle involving commitment to 150 minutes exercise per week, heart healthy diet, and attaining healthy weight.The importance of adequate sleep also discussed. Regular seat belt use and home safety, is also discussed. Changes in health habits are decided on by the patient with goals and time frames  set for achieving them. Immunization and cancer screening needs are specifically addressed at this visit.   Morbid obesity unchanged Patient re-educated about  the importance of commitment to a  minimum of 150 minutes of exercise per week.  The importance of healthy food choices with portion control discussed. Encouraged to start a food diary, count calories and to consider  joining a support group. Sample diet sheets offered. Goals set by the patient for the next several months. Start phentermine daily , half tablet, 4 pound weight loss per month   Weight /BMI 04/07/2017 10/02/2016 09/03/2016  WEIGHT 365 lb 370 lb 365 lb 0.6 oz  HEIGHT 5\' 2"  5\' 2"  5\' 2"   BMI 66.76 kg/m2 67.67 kg/m2 66.77 kg/m2       Depression Increased due to acute grief, pt allowed to vent Sees therapist Medication prescribed  Essential hypertension  Controlled, no change in medication DASH diet and commitment to daily physical activity for a minimum of 30 minutes discussed and encouraged, as a part of hypertension management. The importance of attaining a healthy weight is also discussed.  BP/Weight 04/07/2017 10/02/2016 09/03/2016 06/12/2016 01/09/2016  11/13/2015 11/01/2015  Systolic BP 128 140 170 128 122 124 128  Diastolic BP 82 84 108 86 72 84 82  Wt. (Lbs) 365 370 365.04 362.08 372 387 387  BMI 66.76 67.67 66.77 66.23 68.04 70.78 70.78

## 2017-04-10 ENCOUNTER — Encounter: Payer: Self-pay | Admitting: Family Medicine

## 2017-04-10 NOTE — Assessment & Plan Note (Signed)

## 2017-04-10 NOTE — Assessment & Plan Note (Signed)
unchanged Patient re-educated about  the importance of commitment to a  minimum of 150 minutes of exercise per week.  The importance of healthy food choices with portion control discussed. Encouraged to start a food diary, count calories and to consider  joining a support group. Sample diet sheets offered. Goals set by the patient for the next several months. Start phentermine daily , half tablet, 4 pound weight loss per month   Weight /BMI 04/07/2017 10/02/2016 09/03/2016  WEIGHT 365 lb 370 lb 365 lb 0.6 oz  HEIGHT 5\' 2"  5\' 2"  5\' 2"   BMI 66.76 kg/m2 67.67 kg/m2 66.77 kg/m2

## 2017-04-11 ENCOUNTER — Telehealth: Payer: Self-pay | Admitting: Family Medicine

## 2017-04-11 DIAGNOSIS — E559 Vitamin D deficiency, unspecified: Secondary | ICD-10-CM | POA: Diagnosis not present

## 2017-04-11 DIAGNOSIS — Z13220 Encounter for screening for lipoid disorders: Secondary | ICD-10-CM | POA: Diagnosis not present

## 2017-04-11 DIAGNOSIS — R7303 Prediabetes: Secondary | ICD-10-CM | POA: Diagnosis not present

## 2017-04-11 DIAGNOSIS — D649 Anemia, unspecified: Secondary | ICD-10-CM | POA: Diagnosis not present

## 2017-04-11 DIAGNOSIS — I10 Essential (primary) hypertension: Secondary | ICD-10-CM | POA: Diagnosis not present

## 2017-04-11 NOTE — Telephone Encounter (Signed)
Patient stopped by because her prescription venlafaxine is not covered by insurance. She is requesting something insurance will pay for  [employer] She is aware that DrSimpson is not in the office and says she has a few to last her. Cb#: [phone]

## 2017-04-11 NOTE — Assessment & Plan Note (Signed)
Increased due to acute grief, pt allowed to vent Sees therapist Medication prescribed

## 2017-04-11 NOTE — Assessment & Plan Note (Signed)
Controlled, no change in medication DASH diet and commitment to daily physical activity for a minimum of 30 minutes discussed and encouraged, as a part of hypertension management. The importance of attaining a healthy weight is also discussed.  BP/Weight 04/07/2017 10/02/2016 09/03/2016 06/12/2016 01/09/2016 11/13/2015 11/01/2015  Systolic BP 128 140 170 128 122 124 128  Diastolic BP 82 84 108 86 72 84 82  Wt. (Lbs) 365 370 365.04 362.08 372 387 387  BMI 66.76 67.67 66.77 66.23 68.04 70.78 70.78

## 2017-04-12 LAB — COMPLETE METABOLIC PANEL WITH GFR
AG Ratio: 1.2 (calc) (ref 1.0–2.5)
ALBUMIN MSPROF: 3.6 g/dL (ref 3.6–5.1)
ALT: 10 U/L (ref 6–29)
AST: 15 U/L (ref 10–35)
Alkaline phosphatase (APISO): 76 U/L (ref 33–130)
BILIRUBIN TOTAL: 0.8 mg/dL (ref 0.2–1.2)
BUN: 10 mg/dL (ref 7–25)
CO2: 27 mmol/L (ref 20–32)
CREATININE: 0.71 mg/dL (ref 0.50–1.05)
Calcium: 8.9 mg/dL (ref 8.6–10.4)
Chloride: 104 mmol/L (ref 98–110)
GFR, EST AFRICAN AMERICAN: 112 mL/min/{1.73_m2} (ref >=60)
GFR, Est Non African American: 97 mL/min/{1.73_m2} (ref >=60)
GLOBULIN: 3.1 g/dL (ref 1.9–3.7)
GLUCOSE: 104 mg/dL — AB (ref 65–99)
POTASSIUM: 4.6 mmol/L (ref 3.5–5.3)
Sodium: 139 mmol/L (ref 135–146)
TOTAL PROTEIN: 6.7 g/dL (ref 6.1–8.1)

## 2017-04-12 LAB — LIPID PANEL
CHOLESTEROL: 124 mg/dL (ref <200)
HDL: 51 mg/dL (ref >50)
LDL Cholesterol (Calc): 60 mg/dL (calc)
NON-HDL CHOLESTEROL (CALC): 73 mg/dL (ref <130)
TRIGLYCERIDES: 52 mg/dL (ref <150)
Total CHOL/HDL Ratio: 2.4 (calc) (ref <5.0)

## 2017-04-12 LAB — CBC
HEMATOCRIT: 41.4 % (ref 35.0–45.0)
HEMOGLOBIN: 13.1 g/dL (ref 11.7–15.5)
MCH: 23.4 pg — AB (ref 27.0–33.0)
MCHC: 31.6 g/dL — AB (ref 32.0–36.0)
MCV: 74.1 fL — AB (ref 80.0–100.0)
MPV: 10.6 fL (ref 7.5–12.5)
Platelets: 386 10*3/uL (ref 140–400)
RBC: 5.59 10*6/uL — AB (ref 3.80–5.10)
RDW: 13.5 % (ref 11.0–15.0)
WBC: 6.6 10*3/uL (ref 3.8–10.8)

## 2017-04-12 LAB — TSH: TSH: 2.34 mIU/L

## 2017-04-12 LAB — HEMOGLOBIN A1C
HEMOGLOBIN A1C: 6 %{Hb} — AB (ref <5.7)
MEAN PLASMA GLUCOSE: 126 (calc)
eAG (mmol/L): 7 (calc)

## 2017-04-12 LAB — VITAMIN D 25 HYDROXY (VIT D DEFICIENCY, FRACTURES): Vit D, 25-Hydroxy: 10 ng/mL — ABNORMAL LOW (ref 30–100)

## 2017-04-14 ENCOUNTER — Encounter: Payer: Self-pay | Admitting: Family Medicine

## 2017-04-14 ENCOUNTER — Other Ambulatory Visit: Payer: Self-pay | Admitting: Family Medicine

## 2017-04-14 DIAGNOSIS — H524 Presbyopia: Secondary | ICD-10-CM | POA: Diagnosis not present

## 2017-04-14 DIAGNOSIS — H25043 Posterior subcapsular polar age-related cataract, bilateral: Secondary | ICD-10-CM | POA: Diagnosis not present

## 2017-04-14 DIAGNOSIS — H2513 Age-related nuclear cataract, bilateral: Secondary | ICD-10-CM | POA: Diagnosis not present

## 2017-04-14 DIAGNOSIS — H2512 Age-related nuclear cataract, left eye: Secondary | ICD-10-CM | POA: Diagnosis not present

## 2017-04-14 DIAGNOSIS — H25013 Cortical age-related cataract, bilateral: Secondary | ICD-10-CM | POA: Diagnosis not present

## 2017-04-14 DIAGNOSIS — H25012 Cortical age-related cataract, left eye: Secondary | ICD-10-CM | POA: Diagnosis not present

## 2017-04-14 MED ORDER — ERGOCALCIFEROL 1.25 MG (50000 UT) PO CAPS: 50000.0000 [IU] | ORAL_CAPSULE | ORAL | 3 refills | Status: DC

## 2017-04-15 ENCOUNTER — Encounter (HOSPITAL_COMMUNITY): Payer: Self-pay | Admitting: Psychiatry

## 2017-04-15 ENCOUNTER — Ambulatory Visit (INDEPENDENT_AMBULATORY_CARE_PROVIDER_SITE_OTHER): Payer: Medicare HMO | Admitting: Psychiatry

## 2017-04-15 DIAGNOSIS — R69 Illness, unspecified: Secondary | ICD-10-CM | POA: Diagnosis not present

## 2017-04-15 DIAGNOSIS — F331 Major depressive disorder, recurrent, moderate: Secondary | ICD-10-CM

## 2017-04-15 NOTE — Telephone Encounter (Signed)
Is there something else we can send?

## 2017-04-15 NOTE — Progress Notes (Signed)
   THERAPIST PROGRESS NOTE  Session Time: Tuesday 04/15/2017 10:05 AM - 10:55 AM  Participation Level: Active  Behavioral Response: CasualAlertDepressed  Type of Therapy: Individual Therapy  Treatment Goals addressed: establish rapport, appropriately grief and loss in order to normalize mood and to return to previously adaptive level of functioning  Interventions: Supportive  Summary: Tyliyah TEEGAN GUINTHER is a 57 y.o. female who presents with a history of recurrent depression. She has had two psychiatric hospitalizations. She is a returning patient to this practice and clinician. She ws seen for medication management and outpatient psychotherapy. She is resuming services as symptoms of depression have worsened in recent months. Her 19 yo mother died on 04/09/2017. Patient was her caretaker.  father died 3 years ago. Patient reports additional stress related to assisting her adult children with their financial and parenting responsibilities. Patient's symptoms include depressed mood, poor concentration, tearfulness, irritability, sleep difficulty, fatigue, feelings of hopelessness and worthlessness anxiety, and excessive worrying.  Patient last was seen a week ago She reports little to no change in symptoms. She continues to miss her mother and currently is residing in her mother's home. This triggers increased grief and loss issues regarding her father who died 3 years ago.Patient continues to experience crying spells, fatigue, and decreased interest in activities. She is trying to remain involved and continues to work part time. She reports additional stress related to her children as she provides assistance regarding their financial and parenting responsibilities. She states having little to no time for self.  Suicidal/Homicidal: Nowithout intent/plan  Therapist Response: Established rapport, reviewed symptoms, administered PHQ-9, discussed stressors, facilitate expression of thoughts and  feelings, validated feelings, assisted patient identify strengths and supports, develop treatment plan, began to discuss connection between grief and depression, assigned patient to bring in momentos that remind her of her mother to  next session.   Plan: Return again in 1 week  Diagnosis: Axis I: major depressive disorder, recurrent, moderate    Axis II: Deferred    Annessa Satre, LCSW 04/15/2017

## 2017-04-16 ENCOUNTER — Other Ambulatory Visit: Payer: Self-pay | Admitting: Family Medicine

## 2017-04-16 MED ORDER — FLUOXETINE HCL 20 MG PO TABS: 20.0000 mg | ORAL_TABLET | Freq: Every day | ORAL | 3 refills | Status: DC

## 2017-04-16 NOTE — Progress Notes (Signed)
Fluoxetine to replace effexor due to cost

## 2017-04-16 NOTE — Telephone Encounter (Signed)
Fluoxetine sent in and patient is aware to take this as effexor is too expensive per her report

## 2017-04-17 ENCOUNTER — Encounter: Payer: Self-pay | Admitting: Family Medicine

## 2017-04-23 ENCOUNTER — Encounter (HOSPITAL_COMMUNITY): Payer: Self-pay | Admitting: Psychiatry

## 2017-04-23 ENCOUNTER — Ambulatory Visit (INDEPENDENT_AMBULATORY_CARE_PROVIDER_SITE_OTHER): Payer: Medicare HMO | Admitting: Psychiatry

## 2017-04-23 DIAGNOSIS — F331 Major depressive disorder, recurrent, moderate: Secondary | ICD-10-CM | POA: Diagnosis not present

## 2017-04-23 DIAGNOSIS — R69 Illness, unspecified: Secondary | ICD-10-CM | POA: Diagnosis not present

## 2017-04-23 NOTE — Progress Notes (Signed)
   THERAPIST PROGRESS NOTE  Session Time: Wednesday 04/23/2017 11:06 AM - 11:55 AM          Participation Level: Active  Behavioral Response: CasualAlertDepressed/tearful  Type of Therapy: Individual Therapy  Treatment Goals addressed:  appropriately grieve loss in order to normalize mood and to return to previously adaptive level of functioning  Interventions: Supportive  Summary: [name] is a 57 y.o. female who presents with a history of recurrent depression. She has had two psychiatric hospitalizations. She is a returning patient to this practice and clinician. She ws seen for medication management and outpatient psychotherapy. She is resuming services as symptoms of depression have worsened in recent months. Her 21 yo mother died on 04/12/2017. Patient was her caretaker.  father died 3 years ago. Patient reports additional stress related to assisting her adult children with their financial and parenting responsibilities. Patient's symptoms include depressed mood, poor concentration, tearfulness, irritability, sleep difficulty, fatigue, feelings of hopelessness and worthlessness anxiety, and excessive worrying.  Patient last was seen a week ago She reports continued depressed mood and deep sadness. She reports having a really difficult day yesterday and having crying spells as she experienced increased thoughts about her mother. She has moved to her mother's home and sleeps in her mother's bedroom. Patient continues to experience crying spells, fatigue, and decreased interest in activities. She continues to try to remain involved in activities  and continues to work part time. Patient recently discontinued taking the medication Vibryd due to the cost and reports she was prescribed Prozac last week but hasn't filled prescription yet. Patient reports a history of poor medication compliance as she does not like to be on medication. She states normally taking the medication every other day  or just when she feels like she needs it. However, she does agree to follow through with PCP's instructions regarding medication and states planning to pick up her medication today.  Suicidal/Homicidal: Nowithout intent/plan  Therapist Response:  reviewed symptoms, facilitated patient sharing the narrative of her mother's death, discussed morning rituals in which patient participated, used nondirective techniques to allow patient to express feelings regarding mother's death, discussed other losses patient has experienced, introduced the 5 stages of grief, provided patient with a handout on the 5 stages of grief and asked her to read for next session, provided psycho education regarding depression and treatment, discussed the importance of and encouraged medication compliance, discussed possibility of referral to psychiatrist in the future if needed as patient prefers to continue working with PCP at this time regarding medication management  Plan: Return again in 1 week, patient agrees to follow through with PCPs recommendation regarding medication,  Diagnosis: Axis I: major depressive disorder, recurrent, moderate    Axis II: Deferred    Vadis Slabach, LCSW 04/23/2017

## 2017-04-29 DIAGNOSIS — H25812 Combined forms of age-related cataract, left eye: Secondary | ICD-10-CM | POA: Diagnosis not present

## 2017-04-29 DIAGNOSIS — H2512 Age-related nuclear cataract, left eye: Secondary | ICD-10-CM | POA: Diagnosis not present

## 2017-04-30 ENCOUNTER — Ambulatory Visit (HOSPITAL_COMMUNITY): Payer: Self-pay | Admitting: Psychiatry

## 2017-05-02 ENCOUNTER — Encounter (HOSPITAL_COMMUNITY): Payer: Self-pay | Admitting: Psychiatry

## 2017-05-02 ENCOUNTER — Ambulatory Visit (INDEPENDENT_AMBULATORY_CARE_PROVIDER_SITE_OTHER): Payer: Medicare HMO | Admitting: Psychiatry

## 2017-05-02 DIAGNOSIS — F331 Major depressive disorder, recurrent, moderate: Secondary | ICD-10-CM | POA: Diagnosis not present

## 2017-05-02 DIAGNOSIS — R69 Illness, unspecified: Secondary | ICD-10-CM | POA: Diagnosis not present

## 2017-05-02 DIAGNOSIS — Z634 Disappearance and death of family member: Secondary | ICD-10-CM

## 2017-05-02 NOTE — Progress Notes (Signed)
..    THERAPIST PROGRESS NOTE  Session Time: Friday 05/02/2017 9:14 AM - 10:07 AM           Participation Level: Active  Behavioral Response: CasualAlertDepressed/tearful  Type of Therapy: Individual Therapy  Treatment Goals addressed:  appropriately grieve loss in order to normalize mood and to return to previously adaptive level of functioning  Interventions: Supportive  Summary: [name] is a 57 y.o. female who presents with a history of recurrent depression. She has had two psychiatric hospitalizations. She is a returning patient to this practice and clinician. She ws seen for medication management and outpatient psychotherapy. She is resuming services as symptoms of depression have worsened in recent months. Her 9 yo mother died on 04-22-17. Patient was her caretaker.  father died 3 years ago. Patient reports additional stress related to assisting her adult children with their financial and parenting responsibilities. Patient's symptoms include depressed mood, poor concentration, tearfulness, irritability, sleep difficulty, fatigue, feelings of hopelessness and worthlessness anxiety, and excessive worrying.  Patient last was seen about a  week ago She reports depressed mood, fatigue, anxiety, and worry. She reports multiple stressors including recently receiving information about an appointment with a doctor as part of the review for her eligibility for disability income. She reports additional stress related to her 34 year old brother-in-law being hospitalized last night. Patient expresses worry about him and her sister. She also recently received a call from a childhood close friend informing her his mother is dying. She experiences ongoing stress related to helping her children and grandchildren. Patient reports continued grief and loss issues. She was particularly sad earlier this week when she had to have cataracts removed as her  mother normally accompanied her to all of her  medical procedures. Patient reports she has begun taking medication as prescribed.  Suicidal/Homicidal: Nowithout intent/plan  Therapist Response:  reviewed symptoms, praised and reinforced patient's medication compliance, discussed stressors, facilitated expression of thoughts and feelings, normalized feelings related to grief and loss issues, assisted patient identify strategies to establish time for self nurture and self-care to cope with stress, provided psychoeducation regarding the stress response and ways to trigger a relaxation response, discussed rationale for and practiced deep breathing to trigger relaxation response, asked patient to practice deep breathing 5-10 minutes 2 times per day, provided patient with handout on deep breathing to review   Plan: Return again in 1 week, patient agrees to implement strategies discussed in session   Diagnosis: Axis I: major depressive disorder, recurrent, moderate    Axis II: Deferred    Raziyah Vanvleck, LCSW 05/02/2017

## 2017-05-16 ENCOUNTER — Ambulatory Visit (INDEPENDENT_AMBULATORY_CARE_PROVIDER_SITE_OTHER): Payer: Medicare HMO | Admitting: Psychiatry

## 2017-05-16 ENCOUNTER — Other Ambulatory Visit: Payer: Self-pay

## 2017-05-16 ENCOUNTER — Encounter (HOSPITAL_COMMUNITY): Payer: Self-pay | Admitting: Psychiatry

## 2017-05-16 DIAGNOSIS — F331 Major depressive disorder, recurrent, moderate: Secondary | ICD-10-CM | POA: Diagnosis not present

## 2017-05-16 DIAGNOSIS — R69 Illness, unspecified: Secondary | ICD-10-CM | POA: Diagnosis not present

## 2017-05-16 MED ORDER — IBUPROFEN 800 MG PO TABS: ORAL_TABLET | ORAL | 0 refills | Status: DC

## 2017-05-16 NOTE — Progress Notes (Signed)
..    THERAPIST PROGRESS NOTE  Session Time: Friday 05/16/2017 11:17 AM -  12:02 PM            Participation Level: Active  Behavioral Response: CasualAlertDepressed/tearful  Type of Therapy: Individual Therapy  Treatment Goals addressed:  appropriately grieve loss in order to normalize mood and to return to previously adaptive level of functioning  Interventions: Supportive  Summary: [name] is a 57 y.o. female who presents with a history of recurrent depression. She has had two psychiatric hospitalizations. She is a returning patient to this practice and clinician. She ws seen for medication management and outpatient psychotherapy. She is resuming services as symptoms of depression have worsened in recent months. Her 51 yo mother died on March 31, 2017. Patient was her caretaker.  father died 3 years ago. Patient reports additional stress related to assisting her adult children with their financial and parenting responsibilities. Patient's symptoms include depressed mood, poor concentration, tearfulness, irritability, sleep difficulty, fatigue, feelings of hopelessness and worthlessness anxiety, and excessive worrying.  Patient last was seen about 2 weeks ago She reports continued depressed mood, fatigue, anxiety, and worry. She reports increased stress related to two of her aunts being sick and one of them being hospitalized for congestive heart failure. She worries that she may have another loss and fears she would not be able to handle it. She continues to have grief and loss issues regarding her mother. She also continues to ruminate about upcoming appointment with a doctor as part of the review for her eligibility for disability income. She fears she will lose benefits. She reports continued ongoing stress related to helping her children and grandchildren. She says she was so overwhelmed one day that she had thoughts of wanting to die. She denies active SI, any plan, and any intent. She  says she is beginning to say no in some situations as she feels pulled in so many directions with different family members.  Patient reports she has been using deep breathing and says it has been helpful. She reports being more aware of becoming tense and now having something to do when this happens.    Suicidal/Homicidal: Nowithout intent/plan patient agrees to call this practice, call 911, or have someone take her to the emergency room should symptoms worsen  Therapist Response:  reviewed symptoms, discussed stressors, facilitated expression of thoughts and feelings, praised and reinforced patient's use of assertiveness skills, praised and reinforced patient's use of deep breathing, discussed effects, reviewed rationale for practicing deep breathing consistently, discussed observing mind versus thinking mind, used cognitive defusion technique to assist patient cope with ruminating,castastrophizing thoughts and painful feelings, assigned patient to practice technique discussed in session between sessions, asked patient to continue practicing deep breathing 5-10 minutes 2 times per day, provided patient with handout on deep breathing to review   Plan: Return again in 1 week, patient agrees to implement strategies discussed in session   Diagnosis: Axis I: major depressive disorder, recurrent, moderate    Axis II: Deferred    BYNUM,PEGGY, LCSW 05/16/2017

## 2017-05-19 DIAGNOSIS — H25011 Cortical age-related cataract, right eye: Secondary | ICD-10-CM | POA: Diagnosis not present

## 2017-05-19 DIAGNOSIS — H25041 Posterior subcapsular polar age-related cataract, right eye: Secondary | ICD-10-CM | POA: Diagnosis not present

## 2017-05-19 DIAGNOSIS — H2511 Age-related nuclear cataract, right eye: Secondary | ICD-10-CM | POA: Diagnosis not present

## 2017-05-27 DIAGNOSIS — H2511 Age-related nuclear cataract, right eye: Secondary | ICD-10-CM | POA: Diagnosis not present

## 2017-05-27 DIAGNOSIS — H25811 Combined forms of age-related cataract, right eye: Secondary | ICD-10-CM | POA: Diagnosis not present

## 2017-05-30 ENCOUNTER — Ambulatory Visit (INDEPENDENT_AMBULATORY_CARE_PROVIDER_SITE_OTHER): Payer: Medicare HMO | Admitting: Psychiatry

## 2017-05-30 ENCOUNTER — Encounter (HOSPITAL_COMMUNITY): Payer: Self-pay | Admitting: Psychiatry

## 2017-05-30 DIAGNOSIS — R69 Illness, unspecified: Secondary | ICD-10-CM | POA: Diagnosis not present

## 2017-05-30 DIAGNOSIS — F331 Major depressive disorder, recurrent, moderate: Secondary | ICD-10-CM | POA: Diagnosis not present

## 2017-05-30 NOTE — Progress Notes (Signed)
..    THERAPIST PROGRESS NOTE  Session Time: Friday 05/30/2017 11:20 AM - 11:55 AM          Participation Level: Active  Behavioral Response: CasualAlertDepressed/tearful  Type of Therapy: Individual Therapy  Treatment Goals addressed:  appropriately grieve loss in order to normalize mood and to return to previously adaptive level of functioning  Interventions: Supportive  Summary: [name] is a 57 y.o. female who presents with a history of recurrent depression. She has had two psychiatric hospitalizations. She is a returning patient to this practice and clinician. She ws seen for medication management and outpatient psychotherapy. She is resuming services as symptoms of depression have worsened in recent months. Her 57 yo mother died on 26-Apr-2017. Patient was her caretaker.  father died 3 years ago. Patient reports additional stress related to assisting her adult children with their financial and parenting responsibilities. Patient's symptoms include depressed mood, poor concentration, tearfulness, irritability, sleep difficulty, fatigue, feelings of hopelessness and worthlessness anxiety, and excessive worrying.  Patient last was seen about 2 weeks ago She reports decreased stress, improved mood and decreased anxiety since last session. Both of her aunts have experienced improvement in their medicat conditions. Patient reports she has continued to worry about upcoming appointment with doctor for disability review. She is pleased she has improved assertiveness skills and cites 3 incidents, one at her job, one with her boyfriend,  and the other with her children. Patient reports feeling guilty initially but then feeling better about self. Patient also is pleased she has scheduled a weekend for self for "me time" the end of the month.  Suicidal/Homicidal: Nowithout intent/plan  Therapist Response:  reviewed symptoms, facilitated expression of thoughts and feelings, praised and  reinforced patient's use of assertiveness skills, discussed effects on mood and behavior as well as interaction with others, discussed assertiveness and read handout" basic personal rights", assigned patient to read handouts daily to facilitate thoughts that promote effective assertion, facilitated expression of thoughts and feelings regarding upcoming appointment with doctor for disability review, assisted patient identify/challenge/and replace negative thinking with healthy alternative   Plan: Return again in 2 weeks, patient agrees to implement strategies discussed in session   Diagnosis: Axis I: major depressive disorder, recurrent, moderate    Axis II: Deferred    Savior Himebaugh, LCSW 05/30/2017

## 2017-06-12 ENCOUNTER — Encounter (HOSPITAL_COMMUNITY): Payer: Self-pay | Admitting: Psychiatry

## 2017-06-12 ENCOUNTER — Ambulatory Visit (INDEPENDENT_AMBULATORY_CARE_PROVIDER_SITE_OTHER): Payer: Medicare HMO | Admitting: Psychiatry

## 2017-06-12 DIAGNOSIS — Z634 Disappearance and death of family member: Secondary | ICD-10-CM | POA: Diagnosis not present

## 2017-06-12 DIAGNOSIS — R69 Illness, unspecified: Secondary | ICD-10-CM | POA: Diagnosis not present

## 2017-06-12 DIAGNOSIS — F331 Major depressive disorder, recurrent, moderate: Secondary | ICD-10-CM | POA: Diagnosis not present

## 2017-06-12 NOTE — Progress Notes (Signed)
..              THERAPIST PROGRESS NOTE  Session Time: Friday 06/11/2017 1:10 - 2:05 PM         Participation Level: Active  Behavioral Response: CasualAlertDepressed/tearful  Type of Therapy: Individual Therapy  Treatment Goals addressed:  appropriately grieve loss in order to normalize mood and to return to previously adaptive level of functioning  Interventions: Supportive  Summary: [name] is a 57 y.o. female who presents with a history of recurrent depression. She has had two psychiatric hospitalizations. She is a returning patient to this practice and clinician. She ws seen for medication management and outpatient psychotherapy. She is resuming services as symptoms of depression have worsened in recent months. Her 72 yo mother died on Apr 09, 2017. Patient was her caretaker.  father died 3 years ago. Patient reports additional stress related to assisting her adult children with their financial and parenting responsibilities. Patient's symptoms include depressed mood, poor concentration, tearfulness, irritability, sleep difficulty, fatigue, feelings of hopelessness and worthlessness anxiety, and excessive worrying.  Patient last was seen about 2 weeks ago She reports continue improved mood, decreased anxiety,  and improved use of assertiveness skills since last session. She cites various incidents where she has used skills. She reports reviewing Basic Personal Rights have helped her say no. She is excited about going away this weekend to have time for self. She reports excessive worry about appointment with doctor for disability review the night before appointment but says appointment was positive.  She reports some stress and anxiety about making decision regarding boyfriend possibly moving in with her. She expresses ambivalence.     Suicidal/Homicidal: Nowithout intent/plan  Therapist Response:  reviewed symptoms, facilitated expression of thoughts and feelings, praised and  reinforced patient's use of assertiveness skills, discussed effects on mood and behavior as well as interaction with others, discussed connection between thoughts, feelings, and behavior using examples from patient's life particularly the incident regarding appointment with doctor, assisted patient develop pros and cons regarding boyfriend possibly moving in, assigned patient to continue to review Basic Personal Rights   Plan: Return again in 2 weeks, patient agrees to implement strategies discussed in session   Diagnosis: Axis I: major depressive disorder, recurrent, moderate    Axis II: Deferred    BYNUM,PEGGY, LCSW 06/12/2017

## 2017-06-24 DIAGNOSIS — H5210 Myopia, unspecified eye: Secondary | ICD-10-CM | POA: Diagnosis not present

## 2017-06-24 DIAGNOSIS — I10 Essential (primary) hypertension: Secondary | ICD-10-CM | POA: Diagnosis not present

## 2017-06-26 ENCOUNTER — Encounter (HOSPITAL_COMMUNITY): Payer: Self-pay | Admitting: Psychiatry

## 2017-06-26 ENCOUNTER — Ambulatory Visit (INDEPENDENT_AMBULATORY_CARE_PROVIDER_SITE_OTHER): Payer: Medicare HMO | Admitting: Psychiatry

## 2017-06-26 DIAGNOSIS — R69 Illness, unspecified: Secondary | ICD-10-CM | POA: Diagnosis not present

## 2017-06-26 DIAGNOSIS — F331 Major depressive disorder, recurrent, moderate: Secondary | ICD-10-CM

## 2017-06-26 NOTE — Progress Notes (Signed)
..              THERAPIST PROGRESS NOTE  Session Time: Thursday 06/26/2016 10:14 AM -  11:12 AM                   Participation Level: Active  Behavioral Response: CasualAlert/euthymic  Type of Therapy: Individual Therapy  Treatment Goals addressed:  appropriately grieve loss in order to normalize mood and to return to previously adaptive level of functioning  Interventions: Supportive  Summary: [name] is a 57 y.o. female who presents with a history of recurrent depression. She has had two psychiatric hospitalizations. She is a returning patient to this practice and clinician. She ws seen for medication management and outpatient psychotherapy. She is resuming services as symptoms of depression have worsened in recent months. Her 75 yo mother died on Apr 25, 2017. Patient was her caretaker.  father died 3 years ago. Patient reports additional stress related to assisting her adult children with their financial and parenting responsibilities. Patient's symptoms include depressed mood, poor concentration, tearfulness, irritability, sleep difficulty, fatigue, feelings of hopelessness and worthlessness anxiety, and excessive worrying.  Patient last was seen about 2 weeks ago. She reports continue improved mood, decreased anxiety, and improved use of assertiveness skills since last session. She reports one incident since last session in which she became overwhelmed and was reminded by her daughter to stop and think. Patient expresses frustration she still sometimes becomes overwhelmed. She cites various incidents where she has used Nature conservation officer. She reports deciding to allow boyfriend move in with her and says this is going well. She reports setting clear expectations with him and setting boundaries. She also reports enjoying recent weekend trip with one of her friends. She reports experiencing sadness on the 3 month anniversary of her mother's death and the 3 year anniversary of her  father's death earlier this month. She reports coping by talking to her family and friends as well as posting information on Face Book.     Suicidal/Homicidal: Nowithout intent/plan  Therapist Response:  reviewed symptoms, facilitated expression of thoughts and feelings, normalized feelings of sadness regarding response to anniversary of deaths of parents,  praised and reinforced patient's use of assertiveness skills, discussed effects on mood and behavior as well as interaction with others, introduced concept of mindfulness, discussed mindfulness and the window of tolerance in helping to regulate emotions, assisted patient identify and practice grounding techniques to use when outside of window of tolerance, discussed exercises to practice to improve mindfulness skills, assigned patient to practice mindfulness exercises and review handout provided in session   Plan: Return again in 2 weeks, patient agrees to implement strategies discussed in session   Diagnosis: Axis I: major depressive disorder, recurrent, moderate    Axis II: Deferred    BYNUM,PEGGY, LCSW 06/26/2017

## 2017-06-27 ENCOUNTER — Other Ambulatory Visit: Payer: Self-pay

## 2017-06-27 MED ORDER — FLUOXETINE HCL 20 MG PO TABS: 20.0000 mg | ORAL_TABLET | Freq: Every day | ORAL | 1 refills | Status: DC

## 2017-07-10 ENCOUNTER — Ambulatory Visit (HOSPITAL_COMMUNITY): Payer: Self-pay | Admitting: Psychiatry

## 2017-07-24 ENCOUNTER — Ambulatory Visit (HOSPITAL_COMMUNITY): Payer: Medicare HMO | Admitting: Psychiatry

## 2017-08-14 ENCOUNTER — Encounter (HOSPITAL_COMMUNITY): Payer: Self-pay | Admitting: Psychiatry

## 2017-08-14 ENCOUNTER — Ambulatory Visit (INDEPENDENT_AMBULATORY_CARE_PROVIDER_SITE_OTHER): Payer: Medicare HMO | Admitting: Psychiatry

## 2017-08-14 ENCOUNTER — Encounter (HOSPITAL_COMMUNITY): Payer: Self-pay

## 2017-08-14 ENCOUNTER — Telehealth: Payer: Self-pay

## 2017-08-14 ENCOUNTER — Inpatient Hospital Stay (HOSPITAL_COMMUNITY)
Admission: AD | Admit: 2017-08-14 | Discharge: 2017-08-17 | DRG: 885 | Disposition: A | Discharge status: Home / Self Care | Payer: Medicare HMO | Source: Intra-hospital | Attending: Psychiatry | Admitting: Psychiatry

## 2017-08-14 ENCOUNTER — Ambulatory Visit (INDEPENDENT_AMBULATORY_CARE_PROVIDER_SITE_OTHER): Payer: Medicare HMO | Admitting: Family Medicine

## 2017-08-14 ENCOUNTER — Encounter (HOSPITAL_COMMUNITY): Payer: Self-pay | Admitting: *Deleted

## 2017-08-14 ENCOUNTER — Other Ambulatory Visit: Payer: Self-pay

## 2017-08-14 ENCOUNTER — Emergency Department (HOSPITAL_COMMUNITY)
Admission: EM | Admit: 2017-08-14 | Discharge: 2017-08-14 | Disposition: A | Discharge status: Other Acute Inpatient Hospital | Payer: Medicare HMO | Attending: Emergency Medicine | Admitting: Emergency Medicine

## 2017-08-14 ENCOUNTER — Encounter: Payer: Self-pay | Admitting: Family Medicine

## 2017-08-14 VITALS — BP 114/66 | HR 58 | Ht 61.0 in | Wt 356.0 lb

## 2017-08-14 DIAGNOSIS — F322 Major depressive disorder, single episode, severe without psychotic features: Principal | ICD-10-CM | POA: Diagnosis present

## 2017-08-14 DIAGNOSIS — I110 Hypertensive heart disease with heart failure: Secondary | ICD-10-CM | POA: Diagnosis present

## 2017-08-14 DIAGNOSIS — Z046 Encounter for general psychiatric examination, requested by authority: Secondary | ICD-10-CM | POA: Insufficient documentation

## 2017-08-14 DIAGNOSIS — I5032 Chronic diastolic (congestive) heart failure: Secondary | ICD-10-CM | POA: Diagnosis present

## 2017-08-14 DIAGNOSIS — Z886 Allergy status to analgesic agent status: Secondary | ICD-10-CM

## 2017-08-14 DIAGNOSIS — Z91040 Latex allergy status: Secondary | ICD-10-CM

## 2017-08-14 DIAGNOSIS — Z880 Allergy status to penicillin: Secondary | ICD-10-CM | POA: Diagnosis not present

## 2017-08-14 DIAGNOSIS — Z96653 Presence of artificial knee joint, bilateral: Secondary | ICD-10-CM | POA: Insufficient documentation

## 2017-08-14 DIAGNOSIS — I10 Essential (primary) hypertension: Secondary | ICD-10-CM

## 2017-08-14 DIAGNOSIS — F410 Panic disorder [episodic paroxysmal anxiety] without agoraphobia: Secondary | ICD-10-CM | POA: Diagnosis not present

## 2017-08-14 DIAGNOSIS — Z599 Problem related to housing and economic circumstances, unspecified: Secondary | ICD-10-CM

## 2017-08-14 DIAGNOSIS — R45851 Suicidal ideations: Secondary | ICD-10-CM | POA: Diagnosis not present

## 2017-08-14 DIAGNOSIS — E039 Hypothyroidism, unspecified: Secondary | ICD-10-CM | POA: Diagnosis present

## 2017-08-14 DIAGNOSIS — Z833 Family history of diabetes mellitus: Secondary | ICD-10-CM | POA: Diagnosis not present

## 2017-08-14 DIAGNOSIS — F329 Major depressive disorder, single episode, unspecified: Secondary | ICD-10-CM | POA: Diagnosis not present

## 2017-08-14 DIAGNOSIS — F331 Major depressive disorder, recurrent, moderate: Secondary | ICD-10-CM

## 2017-08-14 DIAGNOSIS — R7303 Prediabetes: Secondary | ICD-10-CM | POA: Diagnosis not present

## 2017-08-14 DIAGNOSIS — I509 Heart failure, unspecified: Secondary | ICD-10-CM | POA: Diagnosis not present

## 2017-08-14 DIAGNOSIS — F332 Major depressive disorder, recurrent severe without psychotic features: Secondary | ICD-10-CM | POA: Diagnosis not present

## 2017-08-14 DIAGNOSIS — Z818 Family history of other mental and behavioral disorders: Secondary | ICD-10-CM | POA: Diagnosis not present

## 2017-08-14 DIAGNOSIS — Z79899 Other long term (current) drug therapy: Secondary | ICD-10-CM | POA: Diagnosis not present

## 2017-08-14 DIAGNOSIS — F320 Major depressive disorder, single episode, mild: Secondary | ICD-10-CM

## 2017-08-14 DIAGNOSIS — Z91048 Other nonmedicinal substance allergy status: Secondary | ICD-10-CM | POA: Diagnosis not present

## 2017-08-14 DIAGNOSIS — F419 Anxiety disorder, unspecified: Secondary | ICD-10-CM | POA: Diagnosis not present

## 2017-08-14 DIAGNOSIS — K219 Gastro-esophageal reflux disease without esophagitis: Secondary | ICD-10-CM | POA: Diagnosis not present

## 2017-08-14 DIAGNOSIS — R69 Illness, unspecified: Secondary | ICD-10-CM | POA: Diagnosis not present

## 2017-08-14 DIAGNOSIS — G4700 Insomnia, unspecified: Secondary | ICD-10-CM | POA: Diagnosis not present

## 2017-08-14 LAB — COMPREHENSIVE METABOLIC PANEL
ALBUMIN: 4 g/dL (ref 3.5–5.0)
ALT: 13 U/L — ABNORMAL LOW (ref 14–54)
AST: 14 U/L — AB (ref 15–41)
Alkaline Phosphatase: 81 U/L (ref 38–126)
Anion gap: 8 (ref 5–15)
BILIRUBIN TOTAL: 0.8 mg/dL (ref 0.3–1.2)
BUN: 16 mg/dL (ref 6–20)
CO2: 29 mmol/L (ref 22–32)
Calcium: 9.7 mg/dL (ref 8.9–10.3)
Chloride: 98 mmol/L — ABNORMAL LOW (ref 101–111)
Creatinine, Ser: 0.72 mg/dL (ref 0.44–1.00)
GFR calc Af Amer: >60 mL/min (ref >60)
GFR calc non Af Amer: >60 mL/min (ref >60)
GLUCOSE: 98 mg/dL (ref 65–99)
POTASSIUM: 4 mmol/L (ref 3.5–5.1)
Sodium: 135 mmol/L (ref 135–145)
TOTAL PROTEIN: 8.8 g/dL — AB (ref 6.5–8.1)

## 2017-08-14 LAB — DIFFERENTIAL
Basophils Absolute: 0 10*3/uL (ref 0.0–0.1)
Basophils Relative: 0 %
EOS ABS: 0.1 10*3/uL (ref 0.0–0.7)
EOS PCT: 1 %
Lymphocytes Relative: 41 %
Lymphs Abs: 3 10*3/uL (ref 0.7–4.0)
Monocytes Absolute: 0.4 10*3/uL (ref 0.1–1.0)
Monocytes Relative: 6 %
NEUTROS PCT: 52 %
Neutro Abs: 3.8 10*3/uL (ref 1.7–7.7)

## 2017-08-14 LAB — CBC
HEMATOCRIT: 42.4 % (ref 36.0–46.0)
HEMOGLOBIN: 12.9 g/dL (ref 12.0–15.0)
MCH: 23.2 pg — AB (ref 26.0–34.0)
MCHC: 30.4 g/dL (ref 30.0–36.0)
MCV: 76.4 fL — AB (ref 78.0–100.0)
Platelets: 421 10*3/uL — ABNORMAL HIGH (ref 150–400)
RBC: 5.55 MIL/uL — ABNORMAL HIGH (ref 3.87–5.11)
RDW: 15.5 % (ref 11.5–15.5)
WBC: 7.6 10*3/uL (ref 4.0–10.5)

## 2017-08-14 LAB — RAPID URINE DRUG SCREEN, HOSP PERFORMED
Amphetamines: NOT DETECTED
BARBITURATES: NOT DETECTED
Benzodiazepines: NOT DETECTED
Cocaine: NOT DETECTED
OPIATES: NOT DETECTED
TETRAHYDROCANNABINOL: NOT DETECTED

## 2017-08-14 LAB — SALICYLATE LEVEL

## 2017-08-14 LAB — ACETAMINOPHEN LEVEL: Acetaminophen (Tylenol), Serum: <10 ug/mL — ABNORMAL LOW (ref 10–30)

## 2017-08-14 LAB — ETHANOL: Alcohol, Ethyl (B): <10 mg/dL (ref <10)

## 2017-08-14 MED ORDER — PHENTERMINE HCL 37.5 MG PO TABS: 37.5000 mg | ORAL_TABLET | Freq: Every day | ORAL | Status: DC

## 2017-08-14 MED ORDER — FLUOXETINE HCL 20 MG PO CAPS
20.0000 mg | ORAL_CAPSULE | Freq: Every day | ORAL | Status: DC
  Administered 2017-08-15: 20 mg via ORAL
  Filled 2017-08-14 (×3): qty 1

## 2017-08-14 MED ORDER — FLUOXETINE HCL 20 MG PO CAPS: 20.0000 mg | ORAL_CAPSULE | Freq: Every day | ORAL | Status: DC

## 2017-08-14 MED ORDER — GABAPENTIN 300 MG PO CAPS
300.0000 mg | ORAL_CAPSULE | Freq: Two times a day (BID) | ORAL | Status: DC
  Administered 2017-08-14 – 2017-08-15 (×2): 300 mg via ORAL
  Filled 2017-08-14 (×7): qty 1

## 2017-08-14 MED ORDER — HYDROCHLOROTHIAZIDE 25 MG PO TABS: 25.0000 mg | ORAL_TABLET | Freq: Every day | ORAL | Status: DC

## 2017-08-14 MED ORDER — ACETAMINOPHEN 325 MG PO TABS
650.0000 mg | ORAL_TABLET | Freq: Once | ORAL | Status: AC
  Administered 2017-08-14: 650 mg via ORAL

## 2017-08-14 MED ORDER — TEMAZEPAM 15 MG PO CAPS: 30.0000 mg | ORAL_CAPSULE | Freq: Every evening | ORAL | Status: DC | PRN

## 2017-08-14 MED ORDER — ACETAMINOPHEN 325 MG PO TABS
ORAL_TABLET | ORAL | Status: AC
  Filled 2017-08-14: qty 2

## 2017-08-14 MED ORDER — PHENTERMINE HCL 37.5 MG PO TABS: 37.5000 mg | ORAL_TABLET | Freq: Every day | ORAL | 1 refills | Status: DC

## 2017-08-14 MED ORDER — GABAPENTIN 300 MG PO CAPS: 300.0000 mg | ORAL_CAPSULE | Freq: Two times a day (BID) | ORAL | Status: DC

## 2017-08-14 MED ORDER — HYDROCHLOROTHIAZIDE 25 MG PO TABS
25.0000 mg | ORAL_TABLET | Freq: Every day | ORAL | Status: DC
  Administered 2017-08-15 – 2017-08-17 (×3): 25 mg via ORAL
  Filled 2017-08-14 (×5): qty 1

## 2017-08-14 MED ORDER — TEMAZEPAM 15 MG PO CAPS
30.0000 mg | ORAL_CAPSULE | Freq: Every evening | ORAL | Status: DC | PRN
  Administered 2017-08-14: 30 mg via ORAL
  Filled 2017-08-14: qty 2

## 2017-08-14 NOTE — ED Notes (Signed)
Pt signed voluntary consent for treatment at this time.
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
## 2017-08-14 NOTE — Progress Notes (Signed)
The patient acknowledged that she had a very long day and that she felt as if she was having a "nervous breakdown" earlier in the day. She is also complaining of having difficulty sleeping. Her goal for tomorrow is to feel better.

## 2017-08-14 NOTE — BH Assessment (Signed)
Tele Assessment Note   Patient Name: TYFFANI FOGLESONG MRN: [id number] Referring Physician: Dr. Aileen Pilot Location of Patient: [facility] Location of Provider: Behavioral Health TTS Department  Gurbani BEAUTY PLESS is an 57 y.o. female. Pt reports SI with a plan to overdose. Pt denies HI and AVH. Pt denies previous SI attempt. Pt states she is depressed due to financial stress and the recent loss of her parents. Pt reports previous inpatient hospitalizations. Pt reports current outpatient therapy. Pt denies current mental health medication. Pt denies SA. Pt reports family support.   Shuvon, NP recommends inpatient treatment. TTS will seek placement.   Diagnosis:  F33.2 MDD  Past Medical History:  Past Medical History:  Diagnosis Date  . Chronic back pain   . Degenerative joint disease    Left TKA in 07/2009-Dr. Landau  . Depression 2001   doesn't take any meds  . Headache(784.0)    occasionally  . Hypothyroidism    was on Levothyroxine-has been off x 4 months  . Joint pain   . Joint swelling   . Morbid obesity (HCC)   . Nocturia   . Osteoarthritis of right knee 07/13/2013  . Peripheral edema    to right leg;takes Furosemide occasionally and hasnt had one in a month  . Syncope    with palpitations; evaluated by [facility] in 2008 with normal echo    Past Surgical History:  Procedure Laterality Date  . ABDOMINAL HYSTERECTOMY  1995    no oophorectomy  . CARPAL TUNNEL RELEASE  1991   Bilateral  . CESAREAN SECTION     X2  . CHOLECYSTECTOMY    . CHOLECYSTECTOMY, LAPAROSCOPIC  2007   Dr. Lovell Sheehan  . COLONOSCOPY N/A 03/03/2013   Procedure: COLONOSCOPY;  Surgeon: Malissa Hippo, MD;  Location: [facility];  Service: Endoscopy;  Laterality: N/A;  930  . CRANIECTOMY SUBOCCIPITAL W/ CERVICAL LAMINECTOMY / CHIARI  2007   Repair of Arnold-Chiari malformation  . JOINT REPLACEMENT Left 2011  . KNEE ARTHROSCOPY  1995   Dr. Cleophas Dunker   . THYROIDECTOMY, PARTIAL   2004   Left lobectomy - large benign nodule  . TONSILLECTOMY    . TOTAL KNEE ARTHROPLASTY  07/2009   Left knee , Dr. Dion Saucier   . TOTAL KNEE ARTHROPLASTY Right 07/13/2013   Procedure: TOTAL KNEE ARTHROPLASTY;  Surgeon: Eulas Post, MD;  Location: [facility] OR;  Service: Orthopedics;  Laterality: Right;    Family History:  Family History  Problem Relation Age of Onset  . Diabetes Mother   . Depression Mother   . Diabetes Father        emphsema   . Hyperlipidemia Father   . Depression Other   . Colon cancer Neg Hx     Social History:  reports that she has never smoked. She has never used smokeless tobacco. She reports that she drinks alcohol. She reports that she does not use drugs.  Additional Social History:  Alcohol / Drug Use Pain Medications: please see mar Prescriptions: please see mar Over the Counter: please see mar History of alcohol / drug use?: No history of alcohol / drug abuse Longest period of sobriety (when/how long): NA  CIWA: CIWA-Ar BP: (!) 141/86 Pulse Rate: 70 COWS:    Allergies:  Allergies  Allergen Reactions  . Aleve [Naproxen Sodium] Shortness Of Breath  . Penicillins Swelling    .Has patient had a PCN reaction causing immediate rash, facial/tongue/throat swelling, SOB or lightheadedness with hypotension: Yes Has patient  had a PCN reaction causing severe rash involving mucus membranes or skin necrosis: No Has patient had a PCN reaction that required hospitalization: No Has patient had a PCN reaction occurring within the last 10 years: No If all of the above answers are "NO", then may proceed with Cephalosporin use.   . Adhesive [Tape]     rash  . Latex Rash    Home Medications:  (Not in a hospital admission)  OB/GYN Status:  No LMP recorded. Patient has had a hysterectomy.  General Assessment Data Location of Assessment: [facility] TTS Assessment: In system Is this a Tele or Face-to-Face Assessment?: Tele Assessment Is this an Initial Assessment or  a Re-assessment for this encounter?: Initial Assessment Marital status: Single Maiden name: NA Is patient pregnant?: No Pregnancy Status: No Living Arrangements: Children Can pt return to current living arrangement?: Yes Admission Status: Voluntary Is patient capable of signing voluntary admission?: Yes Referral Source: Self/Family/Friend Insurance type: Community education officer     Crisis Care Plan Living Arrangements: Children Legal Guardian: Other:(self) Name of Psychiatrist: NA Name of Therapist: NA  Education Status Is patient currently in school?: No Is the patient employed, unemployed or receiving disability?: Employed  Risk to self with the past 6 months Suicidal Ideation: Yes-Currently Present Has patient been a risk to self within the past 6 months prior to admission? : No Suicidal Intent: Yes-Currently Present Has patient had any suicidal intent within the past 6 months prior to admission? : No Is patient at risk for suicide?: Yes Suicidal Plan?: Yes-Currently Present Has patient had any suicidal plan within the past 6 months prior to admission? : No Specify Current Suicidal Plan: to overdose Access to Means: No What has been your use of drugs/alcohol within the last 12 months?: NA Previous Attempts/Gestures: No How many times?: 0 Other Self Harm Risks: NA Triggers for Past Attempts: None known Intentional Self Injurious Behavior: None Family Suicide History: No Recent stressful life event(s): Financial Problems, Loss (Comment) Persecutory voices/beliefs?: No Depression: Yes Depression Symptoms: Tearfulness, Insomnia, Isolating, Loss of interest in usual pleasures, Feeling worthless/self pity, Feeling angry/irritable Substance abuse history and/or treatment for substance abuse?: No Suicide prevention information given to non-admitted patients: Not applicable  Risk to Others within the past 6 months Homicidal Ideation: No Does patient have any lifetime risk of violence toward  others beyond the six months prior to admission? : No Thoughts of Harm to Others: No Current Homicidal Intent: No Current Homicidal Plan: No Access to Homicidal Means: No Identified Victim: NA History of harm to others?: No Assessment of Violence: None Noted Violent Behavior Description: NA Does patient have access to weapons?: No Criminal Charges Pending?: No Does patient have a court date: No Is patient on probation?: No  Psychosis Hallucinations: None noted Delusions: None noted  Mental Status Report Appearance/Hygiene: Unremarkable, In hospital gown Eye Contact: Fair Motor Activity: Freedom of movement Speech: Logical/coherent Level of Consciousness: Alert Mood: Depressed Affect: Depressed Anxiety Level: Minimal Thought Processes: Coherent, Relevant Judgement: Impaired Orientation: Place, Person, Time, Situation Obsessive Compulsive Thoughts/Behaviors: None  Cognitive Functioning Concentration: Normal Memory: Recent Intact, Remote Intact Is patient IDD: No Is patient DD?: No Insight: Poor Impulse Control: Poor Appetite: Fair Have you had any weight changes? : No Change Sleep: No Change Total Hours of Sleep: 8 Vegetative Symptoms: None  ADLScreening [facility] Assessment Services) Patient's cognitive ability adequate to safely complete daily activities?: Yes  Prior Inpatient Therapy Prior Inpatient Therapy: Yes Prior Therapy Dates: "years ago" Prior Therapy Facilty/Provider(s): unknown  Reason for Treatment: depression  Prior Outpatient Therapy Prior Outpatient Therapy: Yes Prior Therapy Dates: current Prior Therapy Facilty/Provider(s): [facility] Reason for Treatment: depression Does patient have an ACCT team?: No Does patient have Intensive In-House Services?  : No Does patient have [facility] services? : No Does patient have P4CC services?: No  ADL Screening (condition at time of admission) Patient's cognitive ability adequate to safely complete daily  activities?: Yes Is the patient deaf or have difficulty hearing?: No Does the patient have difficulty seeing, even when wearing glasses/contacts?: No Does the patient have difficulty concentrating, remembering, or making decisions?: No Does the patient have difficulty dressing or bathing?: No Does the patient have difficulty walking or climbing stairs?: No       Abuse/Neglect Assessment (Assessment to be complete while patient is alone) Abuse/Neglect Assessment Can Be Completed: Yes Physical Abuse: Denies Verbal Abuse: Denies Sexual Abuse: Denies Exploitation of patient/patient's resources: Denies     Merchant navy officer (For Healthcare) Does Patient Have a Medical Advance Directive?: No Would patient like information on creating a medical advance directive?: No - Patient declined    Additional Information 1:1 In Past 12 Months?: No CIRT Risk: No Elopement Risk: No Does patient have medical clearance?: Yes     Disposition:  Disposition Initial Assessment Completed for this Encounter: Yes Disposition of Patient: Admit Type of inpatient treatment program: Adult  This service was provided via telemedicine using a 2-way, interactive audio and video technology.  Names of all persons participating in this telemedicine service and their role in this encounter. Name: Assunta Found Role: NP  Name:  Role:   Name:  Role:   Name:  Role:     Emmit Pomfret 08/14/2017 1:14 PM
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
## 2017-08-14 NOTE — ED Triage Notes (Signed)
Pt has been to see her therapist and her therapist brought her over here to be evaluated; pt states she is "tired of everything"; pt is very teary eyed and upset during triage; pt states she has a plan to harm herself and it would be to take all her sleeping pills; pt states she is about to lose her house

## 2017-08-14 NOTE — ED Notes (Signed)
All belongings including cell phone,pocketbook, rings jewelry and purse locked in lockers.

## 2017-08-14 NOTE — ED Notes (Signed)
Report given to Tina,RN at [facility] at this time. Pt will be transported voluntarily via [facility] transportation.

## 2017-08-14 NOTE — Patient Instructions (Signed)
F/U in 4 months, call if yu need me before  You are referred for behavioral health consultation on the phone before you leave, you need treatment by a psychiatrist  I am also providing you with a noe for your therapist who you see today  CVopntinuie half phentermne daily

## 2017-08-14 NOTE — Plan of Care (Signed)
Pt continues to progress towards goals and d/c. RN will continue to monitor.  

## 2017-08-14 NOTE — Progress Notes (Signed)
Pt accepted to [facility] Bed 402-2 Shuvon Rankin, NP is the accepting provider.  Nehemiah Massed, MD, is the attending provider.  Call report to [phone]  [email or web address] notified.   Pt is Voluntary.  Pt may be transported by [facility]  Pt scheduled  to arrive at [facility] at 1700  Carney Bern T. Kaylyn Lim, MSW, LCSWA Disposition Clinical Social Work [phone] (cell) [phone] (office)

## 2017-08-14 NOTE — Tx Team (Signed)
Initial Treatment Plan 08/14/2017 6:47 PM Senna [name] KCL:[id number]    PATIENT STRESSORS: Loss of father and mother Other: ongoing depressive symptoms   PATIENT STRENGTHS: Ability for insight Average or above average intelligence Capable of independent living Motivation for treatment/growth   PATIENT IDENTIFIED PROBLEMS: Depression  Suicide Risk  "stop being so anxious" - anxiety  "deal with my mom and dads' death"               DISCHARGE CRITERIA:  Ability to meet basic life and health needs Improved stabilization in mood, thinking, and/or behavior Need for constant or close observation no longer present Verbal commitment to aftercare and medication compliance  PRELIMINARY DISCHARGE PLAN: Outpatient therapy Return to previous living arrangement  PATIENT/FAMILY INVOLVEMENT: This treatment plan has been presented to and reviewed with the patient, [name].The patient and family have been given the opportunity to ask questions and make suggestions.  Aurora Mask, RN 08/14/2017, 6:47 PM

## 2017-08-14 NOTE — Progress Notes (Signed)
..        Patient arrives for session today with note from PCP Dr. [name] reporting patient was seen earlier today and shared she has been having suicidal thoughts with no plan for the past week. Patient is very distraught and reports financial issues as trigger for increased symptoms of depression. Patient reports she is about to lose her home. She says she doesn't want to go to hospital. She eventually shares she does have a plan to harm self. She admits she may need hospitalization but expresses worry about potential reactions from children and her family. Therapist discusses issues with patient and she agrees to go to ER for assessment for hospitalization. Therapist escorts patient to [facility] ER and shares information with triage nurse regarding need for assessment for hospitalization.    Jhoan Schmieder, LCSW 08/14/2017

## 2017-08-14 NOTE — ED Notes (Signed)
TTS machine placed in room. 

## 2017-08-14 NOTE — ED Provider Notes (Signed)
[facility] EMERGENCY DEPARTMENT Provider Note   CSN: [id number] Arrival date & time: 08/14/17  1039     History   Chief Complaint Chief Complaint  Patient presents with  . V70.1    HPI [name] is a 57 y.o. female.  Patient states she is depressed and she is thinking about killing herself.  The history is provided by the patient. No language interpreter was used.  Altered Mental Status   This is a recurrent problem. The current episode started more than 2 days ago. The problem has not changed since onset.Pertinent negatives include no confusion, no seizures and no hallucinations. Risk factors: Unknown. Her past medical history does not include seizures.    Past Medical History:  Diagnosis Date  . Chronic back pain   . Degenerative joint disease    Left TKA in 07/2009-Dr. Landau  . Depression 2001   doesn't take any meds  . Headache(784.0)    occasionally  . Hypothyroidism    was on Levothyroxine-has been off x 4 months  . Joint pain   . Joint swelling   . Morbid obesity (HCC)   . Nocturia   . Osteoarthritis of right knee 07/13/2013  . Peripheral edema    to right leg;takes Furosemide occasionally and hasnt had one in a month  . Syncope    with palpitations; evaluated by [facility] in 2008 with normal echo    Patient Active Problem List   Diagnosis Date Noted  . Back pain with sciatica 11/13/2015  . Prediabetes 07/31/2015  . Annual physical exam 04/09/2015  . Knee osteoarthritis 07/13/2013  . Chronic diastolic heart failure (HCC) 01/06/2013  . Snoring disorder 01/03/2013  . Thoracic spine pain 12/29/2012  . Vitamin D deficiency 12/08/2012  . GERD (gastroesophageal reflux disease) 07/15/2012  . Exercise intolerance 11/26/2011  . Essential hypertension 11/26/2011  . Depression   . 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 Anemia 07/03/2011  . Morbid obesity (HCC) 12/03/2007    Past Surgical History:  Procedure Laterality  Date  . ABDOMINAL HYSTERECTOMY  1995    no oophorectomy  . CARPAL TUNNEL RELEASE  1991   Bilateral  . CESAREAN SECTION     X2  . CHOLECYSTECTOMY    . CHOLECYSTECTOMY, LAPAROSCOPIC  2007   Dr. Lovell Sheehan  . COLONOSCOPY N/A 03/03/2013   Procedure: COLONOSCOPY;  Surgeon: Malissa Hippo, MD;  Location: [facility] ENDO SUITE;  Service: Endoscopy;  Laterality: N/A;  930  . CRANIECTOMY SUBOCCIPITAL W/ CERVICAL LAMINECTOMY / CHIARI  2007   Repair of Arnold-Chiari malformation  . JOINT REPLACEMENT Left 2011  . KNEE ARTHROSCOPY  1995   Dr. Cleophas Dunker   . THYROIDECTOMY, PARTIAL  2004   Left lobectomy - large benign nodule  . TONSILLECTOMY    . TOTAL KNEE ARTHROPLASTY  07/2009   Left knee , Dr. Dion Saucier   . TOTAL KNEE ARTHROPLASTY Right 07/13/2013   Procedure: TOTAL KNEE ARTHROPLASTY;  Surgeon: Eulas Post, MD;  Location: [facility] OR;  Service: Orthopedics;  Laterality: Right;     OB History   None      Home Medications    Prior to Admission medications   Medication Sig Start Date End Date Taking? Authorizing Provider  ergocalciferol (VITAMIN D2) 50000 units capsule Take 1 capsule (50,000 Units total) by mouth once a week. One capsule once weekly 04/14/17   Kerri Perches, MD  FLUoxetine (PROZAC) 20 MG tablet Take 1 tablet (20 mg total) by mouth daily. 06/27/17  Kerri Perches, MD  gabapentin (NEURONTIN) 300 MG capsule Take 1 capsule (300 mg total) by mouth 2 (two) times daily. 04/07/17   Kerri Perches, MD  hydrochlorothiazide (HYDRODIURIL) 25 MG tablet Take 1 tablet (25 mg total) by mouth daily. 04/07/17   Kerri Perches, MD  ibuprofen (ADVIL,MOTRIN) 800 MG tablet One tablet three times weekly as needed for uncontrolled knee pain 05/16/17   Kerri Perches, MD  phentermine (ADIPEX-P) 37.5 MG tablet Take 1 tablet (37.5 mg total) by mouth daily before breakfast. 08/14/17   Kerri Perches, MD  temazepam (RESTORIL) 30 MG capsule Take 1 capsule (30 mg total) by mouth at bedtime as  needed for sleep. 04/07/17   Kerri Perches, MD  ALPRAZolam Prudy Feeler) 0.25 MG tablet Take 1 tablet (0.25 mg total) by mouth at bedtime as needed for anxiety. 06/14/14 11/28/14  Kerri Perches, MD    Family History Family History  Problem Relation Age of Onset  . Diabetes Mother   . Depression Mother   . Diabetes Father        emphsema   . Hyperlipidemia Father   . Depression Other   . Colon cancer Neg Hx     Social History Social History   Tobacco Use  . Smoking status: Never Smoker  . Smokeless tobacco: Never Used  Substance Use Topics  . Alcohol use: Yes    Alcohol/week: 0.0 oz    Comment: once a month -glass or shot   . Drug use: No     Allergies   Aleve [naproxen sodium]; Penicillins; Adhesive [tape]; and Latex   Review of Systems Review of Systems  Constitutional: Negative for appetite change and fatigue.  HENT: Negative for congestion, ear discharge and sinus pressure.   Eyes: Negative for discharge.  Respiratory: Negative for cough.   Cardiovascular: Negative for chest pain.  Gastrointestinal: Negative for abdominal pain and diarrhea.  Genitourinary: Negative for frequency and hematuria.  Musculoskeletal: Negative for back pain.  Skin: Negative for rash.  Neurological: Negative for seizures and headaches.  Psychiatric/Behavioral: Positive for dysphoric mood. Negative for confusion and hallucinations.     Physical Exam Updated Vital Signs BP (!) 141/86 (BP Location: Right Arm)   Pulse 70   Temp 98.7 F (37.1 C) (Oral)   Resp 16   Ht 5\' 2"  (1.575 m)   Wt (!) 161.5 kg (356 lb)   SpO2 99%   BMI 65.11 kg/m   Physical Exam  Constitutional: She is oriented to person, place, and time. She appears well-developed.  HENT:  Head: Normocephalic.  Eyes: Conjunctivae and EOM are normal. No scleral icterus.  Neck: Neck supple. No thyromegaly present.  Cardiovascular: Normal rate and regular rhythm. Exam reveals no gallop and no friction rub.  No  murmur heard. Pulmonary/Chest: No stridor. She has no wheezes. She has no rales. She exhibits no tenderness.  Abdominal: She exhibits no distension. There is no tenderness. There is no rebound.  Musculoskeletal: Normal range of motion. She exhibits no edema.  Lymphadenopathy:    She has no cervical adenopathy.  Neurological: She is oriented to person, place, and time. She exhibits normal muscle tone. Coordination normal.  Skin: No rash noted. No erythema.  Psychiatric:  Patient is depressed and suicidal.     ED Treatments / Results  Labs (all labs ordered are listed, but only abnormal results are displayed) Labs Reviewed  CBC - Abnormal; Notable for the following components:      Result Value  RBC 5.55 (*)    MCV 76.4 (*)    MCH 23.2 (*)    Platelets 421 (*)    All other components within normal limits  RAPID URINE DRUG SCREEN, HOSP PERFORMED  DIFFERENTIAL  COMPREHENSIVE METABOLIC PANEL  ETHANOL  ACETAMINOPHEN LEVEL  SALICYLATE LEVEL    EKG None  Radiology No results found.  Procedures Procedures (including critical care time)  Medications Ordered in ED Medications - No data to display   Initial Impression / Assessment and Plan / ED Course  I have reviewed the triage vital signs and the nursing notes.  Pertinent labs & imaging results that were available during my care of the patient were reviewed by me and considered in my medical decision making (see chart for details).     Patient is depressed and suicidal.  She was seen by behavioral health and is felt she warrants inpatient treatment.  Patient is medically cleared and will be admitted to psych  Final Clinical Impressions(s) / ED Diagnoses   Final diagnoses:  None    ED Discharge Orders    None       Bethann Berkshire, MD 08/14/17 1423

## 2017-08-14 NOTE — ED Notes (Signed)
Pt tearful but cooperative. Pt states about to lose her house, lost mother recently this year, father a few years ago. Pt has plan to take bottle of sleeping pills to kill herself.

## 2017-08-14 NOTE — ED Notes (Signed)
Walked over by therapist SI with plan No call from therapist to [facility] Previous admission to [facility] "years ago" Losses: mother in 2022-04-20 died, losing home, works part time Support: siblings, opt provider Meds: per pt antidepressant x 6 months Pt reports tired of everything, When asked if tired of living or tired of situation she finds herself in, pt relies that she is tired of situation, then states "no, I am just tired of it all" Yet replies yes when asked if can receive blood in emergency

## 2017-08-15 ENCOUNTER — Ambulatory Visit (HOSPITAL_COMMUNITY): Payer: Self-pay | Admitting: Psychiatry

## 2017-08-15 DIAGNOSIS — Z79899 Other long term (current) drug therapy: Secondary | ICD-10-CM

## 2017-08-15 DIAGNOSIS — R45851 Suicidal ideations: Secondary | ICD-10-CM

## 2017-08-15 DIAGNOSIS — G4700 Insomnia, unspecified: Secondary | ICD-10-CM

## 2017-08-15 DIAGNOSIS — F322 Major depressive disorder, single episode, severe without psychotic features: Principal | ICD-10-CM

## 2017-08-15 DIAGNOSIS — Z818 Family history of other mental and behavioral disorders: Secondary | ICD-10-CM

## 2017-08-15 MED ORDER — GABAPENTIN 100 MG PO CAPS
100.0000 mg | ORAL_CAPSULE | Freq: Three times a day (TID) | ORAL | Status: DC
  Administered 2017-08-15 – 2017-08-17 (×6): 100 mg via ORAL
  Filled 2017-08-15 (×11): qty 1

## 2017-08-15 MED ORDER — HYDROXYZINE HCL 25 MG PO TABS
25.0000 mg | ORAL_TABLET | Freq: Four times a day (QID) | ORAL | Status: DC | PRN
  Administered 2017-08-16: 25 mg via ORAL
  Filled 2017-08-15 (×2): qty 1

## 2017-08-15 MED ORDER — TEMAZEPAM 15 MG PO CAPS
15.0000 mg | ORAL_CAPSULE | Freq: Every evening | ORAL | Status: DC | PRN
  Administered 2017-08-15 – 2017-08-16 (×2): 15 mg via ORAL
  Filled 2017-08-15 (×2): qty 1

## 2017-08-15 MED ORDER — DULOXETINE HCL 30 MG PO CPEP
30.0000 mg | ORAL_CAPSULE | Freq: Every day | ORAL | Status: DC
  Administered 2017-08-16 – 2017-08-17 (×2): 30 mg via ORAL
  Filled 2017-08-15 (×4): qty 1
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
## 2017-08-15 NOTE — Progress Notes (Signed)
Nursing note 7p-7a  Pt observed interacting with peers on unit this shift. Displayed a flat affect and mood upon interaction with this Clinical research associate. Pt denies pain ,denies SI/HI, and also denies audio or visual hallucinations at this time.  Pt continues to remain safe on the unit and is observed by rounding every 15 min. Pt able to contract for safety. Pt resting in bed with eyes closed with no signs or symptoms of pain or distress noted. RN will continue to monitor.

## 2017-08-15 NOTE — Tx Team (Addendum)
Interdisciplinary Treatment and Diagnostic Plan Update  08/15/2017 Time of Session: 0935 [name] MRN: [id number]  Principal Diagnosis: <principal problem not specified>  Secondary Diagnoses: Active Problems:   MDD (major depressive disorder), severe (HCC)   Current Medications:  Current Facility-Administered Medications  Medication Dose Route Frequency Provider Last Rate Last Dose  . [START ON 08/16/2017] DULoxetine (CYMBALTA) DR capsule 30 mg  30 mg Oral Daily Cobos, Fernando A, MD      . gabapentin (NEURONTIN) capsule 100 mg  100 mg Oral TID Cobos, Myer Peer, MD      . hydrochlorothiazide (HYDRODIURIL) tablet 25 mg  25 mg Oral Daily Rankin, Shuvon B, NP   25 mg at 08/15/17 8119  . hydrOXYzine (ATARAX/VISTARIL) tablet 25 mg  25 mg Oral Q6H PRN Cobos, Fernando A, MD      . temazepam (RESTORIL) capsule 15 mg  15 mg Oral QHS PRN Cobos, Myer Peer, MD       PTA Medications: Medications Prior to Admission  Medication Sig Dispense Refill Last Dose  . FLUoxetine (PROZAC) 20 MG tablet Take 1 tablet (20 mg total) by mouth daily. 90 tablet 1 08/14/2017 at 0730  . gabapentin (NEURONTIN) 300 MG capsule Take 1 capsule (300 mg total) by mouth 2 (two) times daily. 60 capsule 5 Taking  . hydrochlorothiazide (HYDRODIURIL) 25 MG tablet Take 1 tablet (25 mg total) by mouth daily. 90 tablet 3 08/14/2017 at 0730  . phentermine (ADIPEX-P) 37.5 MG tablet Take 1 tablet (37.5 mg total) by mouth daily before breakfast. 30 tablet 1 08/14/2017 at 0730  . temazepam (RESTORIL) 30 MG capsule Take 1 capsule (30 mg total) by mouth at bedtime as needed for sleep. 30 capsule 4 08/13/2017 at Unknown time    Patient Stressors: Loss of father and mother Other: ongoing depressive symptoms  Patient Strengths: Ability for insight Average or above average intelligence Capable of independent living Motivation for treatment/growth  Treatment Modalities: Medication Management, Group therapy, Case management,  1  to 1 session with clinician, Psychoeducation, Recreational therapy.   Physician Treatment Plan for Primary Diagnosis: <principal problem not specified> Long Term Goal(s): Improvement in symptoms so as ready for discharge Improvement in symptoms so as ready for discharge   Short Term Goals: Ability to identify changes in lifestyle to reduce recurrence of condition will improve Ability to maintain clinical measurements within normal limits will improve Ability to identify changes in lifestyle to reduce recurrence of condition will improve Ability to verbalize feelings will improve Ability to disclose and discuss suicidal ideas Ability to demonstrate self-control will improve Ability to identify and develop effective coping behaviors will improve Ability to maintain clinical measurements within normal limits will improve  Medication Management: Evaluate patient's response, side effects, and tolerance of medication regimen.  Therapeutic Interventions: 1 to 1 sessions, Unit Group sessions and Medication administration.  Evaluation of Outcomes: Not Met  Physician Treatment Plan for Secondary Diagnosis: Active Problems:   MDD (major depressive disorder), severe ([place])  Long Term Goal(s): Improvement in symptoms so as ready for discharge Improvement in symptoms so as ready for discharge   Short Term Goals: Ability to identify changes in lifestyle to reduce recurrence of condition will improve Ability to maintain clinical measurements within normal limits will improve Ability to identify changes in lifestyle to reduce recurrence of condition will improve Ability to verbalize feelings will improve Ability to disclose and discuss suicidal ideas Ability to demonstrate self-control will improve Ability to identify and develop effective coping behaviors will improve  Ability to maintain clinical measurements within normal limits will improve     Medication Management: Evaluate patient's  response, side effects, and tolerance of medication regimen.  Therapeutic Interventions: 1 to 1 sessions, Unit Group sessions and Medication administration.  Evaluation of Outcomes: Not Met   RN Treatment Plan for Primary Diagnosis: <principal problem not specified> Long Term Goal(s): Knowledge of disease and therapeutic regimen to maintain health will improve  Short Term Goals: Ability to identify and develop effective coping behaviors will improve and Compliance with prescribed medications will improve  Medication Management: RN will administer medications as ordered by provider, will assess and evaluate patient's response and provide education to patient for prescribed medication. RN will report any adverse and/or side effects to prescribing provider.  Therapeutic Interventions: 1 on 1 counseling sessions, Psychoeducation, Medication administration, Evaluate responses to treatment, Monitor vital signs and CBGs as ordered, Perform/monitor CIWA, COWS, AIMS and Fall Risk screenings as ordered, Perform wound care treatments as ordered.  Evaluation of Outcomes: Not Met   LCSW Treatment Plan for Primary Diagnosis: <principal problem not specified> Long Term Goal(s): Safe transition to appropriate next level of care at discharge, Engage patient in therapeutic group addressing interpersonal concerns.  Short Term Goals: Engage patient in aftercare planning with referrals and resources, Increase social support and Increase skills for wellness and recovery  Therapeutic Interventions: Assess for all discharge needs, 1 to 1 time with Social worker, Explore available resources and support systems, Assess for adequacy in community support network, Educate family and significant other(s) on suicide prevention, Complete Psychosocial Assessment, Interpersonal group therapy.  Evaluation of Outcomes: Not Met   Progress in Treatment: Attending groups: No. Participating in groups: No. Taking medication  as prescribed: Yes. Toleration medication: Yes. Family/Significant other contact made: No, will contact:  when given permission Patient understands diagnosis: Yes. Discussing patient identified problems/goals with staff: Yes. Medical problems stabilized or resolved: Yes. Denies suicidal/homicidal ideation: Yes. Issues/concerns per patient self-inventory: No. Other: none  New problem(s) identified: No, Describe:  none  New Short Term/Long Term Goal(s):  Patient Goals:  Decreasing depression and stress level.  Discharge Plan or Barriers:   Reason for Continuation of Hospitalization: Anxiety Depression Medication stabilization  Estimated Length of Stay: 3-5 days.  Attendees: Patient:[name] 08/15/2017   Physician: Dr Parke Poisson, MD 08/15/2017   Nursing: Vira Browns, RN 08/15/2017   RN Care Manager: 08/15/2017   Social Worker: Lurline Idol, LCSW 08/15/2017   Recreational Therapist:  08/15/2017   Other:  08/15/2017   Other:  08/15/2017   Other: 08/15/2017        Scribe for Treatment Team: Joanne Chars, [place] 08/15/2017 3:37 PM

## 2017-08-15 NOTE — Progress Notes (Signed)
Pt attended group this evening. 

## 2017-08-15 NOTE — [facility] Group Notes (Signed)
[facility] Group Notes:  (Nursing/MHT/Case Management/Adjunct)  Date:  08/15/2017  Time:  5:37 PM Type of Therapy:  Psychoeducational Skills  Participation Level:  Active  Participation Quality:  Appropriate  Affect:  Appropriate  Cognitive:  Appropriate  Insight:  Appropriate  Engagement in Group:  Engaged  Modes of Intervention:  Problem-solving  Summary of Progress/Problems: Pt played bingo with peers.   Bethann Punches 08/15/2017, 5:37 PM

## 2017-08-15 NOTE — Progress Notes (Signed)
NUTRITION ASSESSMENT  Pt identified as at risk on the Malnutrition Screen Tool  INTERVENTION: 1. Educated patient on the importance of nutrition and encouraged intake of food and beverages. 2. Discussed weight goals. 3. Supplements: none at this time.   NUTRITION DIAGNOSIS: Unintentional weight loss related to sub-optimal intake as evidenced by pt report.   Goal: Pt to meet >/= 90% of their estimated nutrition needs.  Monitor:  PO intake  Assessment:  Pt admitted for SI without a plan x1 week. She had reported increased depression, mainly over financial issues and that she is going to soon lose her home.   Per chart review, pt has lost 9 lbs (2.5% body weight) in the past 5 months; this is not significant for time frame.   Continue to encourage PO intakes of meals and snacks.   57 y.o. female  Height: Ht Readings from Last 1 Encounters:  08/14/17 5\' 2"  (1.575 m)    Weight: Wt Readings from Last 1 Encounters:  08/14/17 (!) 356 lb (161.5 kg)    Weight Hx: Wt Readings from Last 10 Encounters:  08/14/17 (!) 356 lb (161.5 kg)  08/14/17 (!) 356 lb (161.5 kg)  08/14/17 (!) 356 lb 0.6 oz (161.5 kg)  04/07/17 (!) 365 lb (165.6 kg)  10/02/16 (!) 370 lb (167.8 kg)  09/03/16 (!) 365 lb 0.6 oz (165.6 kg)  06/12/16 (!) 362 lb 1.3 oz (164.2 kg)  01/09/16 (!) 372 lb (168.7 kg)  11/13/15 (!) 387 lb (175.5 kg)  11/01/15 (!) 387 lb (175.5 kg)    BMI:  Body mass index is 65.11 kg/m. Pt meets criteria for morbid obesity based on current BMI.  Estimated Nutritional Needs: Kcal: 25-30 kcal/kg Protein: > 1 gram protein/kg Fluid: 1 ml/kcal  Diet Order:  Diet Order           Diet regular Room service appropriate? Yes; Fluid consistency: Thin  Diet effective now         Pt is also offered choice of unit snacks mid-morning and mid-afternoon.  Pt is eating as desired.   Lab results and medications reviewed.     11/03/15, MS, RD, LDN, [facility] Inpatient Clinical  Dietitian Pager # [phone] After hours/weekend pager # [phone]

## 2017-08-15 NOTE — Plan of Care (Signed)
  Problem: Education: Goal: Emotional status will improve Outcome: Not Progressing   

## 2017-08-15 NOTE — Progress Notes (Signed)
Recreation Therapy Notes  Date: 5.31.19 Time: 0930 Location: 400 Hall Dayroom  Group Topic: Stress Management  Goal Area(s) Addresses:  Patient will verbalize importance of using healthy stress management.  Patient will identify positive emotions associated with healthy stress management.   Behavioral Response: Engaged  Intervention: Stress Management  Activity :  Progressive Muscle Relaxation.  LRT lead group in progressive muscle relaxation.  Patients were to tense each muscle one at a time and then release the tension.  Patients were to follow along as LRT lead them through the activity.    Education:  Stress Management, Discharge Planning.   Education Outcome: Acknowledges edcuation/In group clarification offered/Needs additional education  Clinical Observations/Feedback:  Pt attended and participated in group.      Caroll Rancher, LRT/CTRS         Caroll Rancher A 08/15/2017 11:17 AM

## 2017-08-15 NOTE — Progress Notes (Signed)
D Pt is observed OOB UAL on the 400 hall today, tolerated fair. Her affect remains flat, sad and depressed.      A She attended her treatment team this morning and during this she stated she wants to get some help with her depression  And anxeity and learn to develop healthier coping skills.She completed her daily assessment and on this she wrote she deneid SI today and she rated her depression, hopelessness and anxiety " 6/5/0", respectively.      R Safety is in place.

## 2017-08-15 NOTE — [facility] Group Notes (Signed)
[facility] LCSW Group Therapy Note  Date/Time: 08/15/17, 1315  Type of Therapy/Topic:  Group Therapy:  Balance in Life  Participation Level:  moderate  Description of Group:    This group will address the concept of balance and how it feels and looks when one is unbalanced. Patients will be encouraged to process areas in their lives that are out of balance, and identify reasons for remaining unbalanced. Facilitators will guide patients utilizing problem- solving interventions to address and correct the stressor making their life unbalanced. Understanding and applying boundaries will be explored and addressed for obtaining  and maintaining a balanced life. Patients will be encouraged to explore ways to assertively make their unbalanced needs known to significant others in their lives, using other group members and facilitator for support and feedback.  Therapeutic Goals: 1. Patient will identify two or more emotions or situations they have that consume much of in their lives. 2. Patient will identify signs/triggers that life has become out of balance:  3. Patient will identify two ways to set boundaries in order to achieve balance in their lives:  4. Patient will demonstrate ability to communicate their needs through discussion and/or role plays  Summary of Patient Progress:Pt shared that work, finances, and family are areas that are out of balance in her life.  Pt was active in groups discussion about ways to take steps to address areas of life that get out of balance.           Therapeutic Modalities:   Cognitive Behavioral Therapy Solution-Focused Therapy Assertiveness Training  Daleen Squibb, [place]
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
## 2017-08-16 DIAGNOSIS — F332 Major depressive disorder, recurrent severe without psychotic features: Secondary | ICD-10-CM

## 2017-08-16 DIAGNOSIS — F419 Anxiety disorder, unspecified: Secondary | ICD-10-CM

## 2017-08-16 NOTE — [facility] Group Notes (Signed)
Recreation Activity  Date:  08/16/2017  Time:  7:27 PM  Type of Therapy:  BINGO  :  The purpose of the group is to create a forum where patients can experience laughter ina benign Bingo game.  Participation Level:  Active  Participation Quality:  Appropriate  Affect:  Appropriate  Cognitive:  Alert  Insight:  Improving  Engagement in Group:  Engaged  Modes of Intervention:  Rapport [place]  Summary of Progress/Problems:  [name] 08/16/2017, 7:27 PM

## 2017-08-16 NOTE — Progress Notes (Signed)
Pt attended group this evening. 

## 2017-08-16 NOTE — [facility] Group Notes (Signed)
Identifying Needs   Date:  08/16/2017  Time:  4:15 PM  Type of Therapy:  Nurse Education  /  The group focuses on teaching patients how to identify their needs and then how to develop the healthy coping skills needed to get those needs met.  Participation Level:  Active  Participation Quality:  Attentive  Affect:  Appropriate  Cognitive:  Alert  Insight:  Good  Engagement in Group:  Engaged  Modes of Intervention:  Education  Summary of Progress/Problems:  Lauralyn Primes 08/16/2017, 4:15 PM

## 2017-08-16 NOTE — Progress Notes (Signed)
Patient ID: [name], female   DOB: 08/21/1960, 57 y.o.   MRN: [id number] Pt observed in the dayroom flat and withdrawn to self. Pt at the time of assessment endorsed moderate depression and some anxiety-Pt denied SI and contracts for safety; "I love to do for people but my problem was I didn't know when to stop." Pt denied SI/HI or pain. Medications offered as prescribed. All patient's questions and concerns addressed. Support, encouragement, and safe environment provided. Will continue to monitor for any changes. 15-minute safety checks continue. Pt was med compliant. Pt attended wrap-up group. Safety checks continue.

## 2017-08-16 NOTE — Plan of Care (Signed)
  Problem: Education: Goal: Knowledge of [facility] Education information/materials will improve Outcome: Progressing   

## 2017-08-16 NOTE — Progress Notes (Addendum)
[facility] MD Progress Note  08/16/2017 9:02 AM [name]  MRN:  [id number]   Subjective: Patient reports that she is feeling better today.  She states that she had just gotten out of the shower this morning.  She rates her depression at a 4 out of 10 with 10 being the worst.  She denies any anxiety or SI/HI/AVH and contracts for safety.  She states that she slept very well last night with the Restoril and that she has a good appetite as well.  She states that she is hoping to discharge home tomorrow as tonight will be her third night here.  Objective: Patient's chart and findings reviewed and discussed with treatment team.  Patient presents calm and on the hallway and is pleasant and cooperative.  Patient has been seen in the day room interacting with peers and staff appropriately.  Patient denied any medication side effects, so will continue same medications at same doses since she just started the Cymbalta today.  We will consider increasing the Cymbalta to 60 mg a day before discharge.  We will discussed with Dr. Parke Poisson about possible discharge for patient tomorrow.   Principal Problem: MDD (major depressive disorder), severe ([place]) Diagnosis:   Patient Active Problem List   Diagnosis Date Noted  . MDD (major depressive disorder), severe ([place]) [F32.2] 08/14/2017  . Back pain with sciatica [M54.9, M54.30] 11/13/2015  . Prediabetes [R73.03] 07/31/2015  . Annual physical exam [P95.09] 04/09/2015  . Knee osteoarthritis [M17.10] 07/13/2013  . Chronic diastolic heart failure ([place]) [I50.32] 01/06/2013  . Snoring disorder [R06.83] 01/03/2013  . Thoracic spine pain [M54.6] 12/29/2012  . Vitamin D deficiency [E55.9] 12/08/2012  . GERD (gastroesophageal reflux disease) [K21.9] 07/15/2012  . Exercise intolerance [R68.89] 11/26/2011  . Essential hypertension [I10] 11/26/2011  . Depression [F32.9]   . 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 Anemia [D64.9] 07/03/2011  . Morbid obesity  ([place]) [E66.01] 12/03/2007   Total Time spent with patient: 20 minutes  Past Psychiatric History: See H&P  Past Medical History:  Past Medical History:  Diagnosis Date  . Chronic back pain   . Degenerative joint disease    Left TKA in 07/2009-Dr. Landau  . Depression 2001   doesn't take any meds  . Headache(784.0)    occasionally  . Hypothyroidism    was on Levothyroxine-has been off x 4 months  . Joint pain   . Joint swelling   . Morbid obesity ([place])   . Nocturia   . Osteoarthritis of right knee 07/13/2013  . Peripheral edema    to right leg;takes Furosemide occasionally and hasnt had one in a month  . Syncope    with palpitations; evaluated by [facility] in 2008 with normal echo    Past Surgical History:  Procedure Laterality Date  . ABDOMINAL HYSTERECTOMY  1995    no oophorectomy  . CARPAL TUNNEL RELEASE  1991   Bilateral  . CESAREAN SECTION     X2  . CHOLECYSTECTOMY    . CHOLECYSTECTOMY, LAPAROSCOPIC  2007   Dr. Arnoldo Morale  . COLONOSCOPY N/A 03/03/2013   Procedure: COLONOSCOPY;  Surgeon: Rogene Houston, MD;  Location: [facility];  Service: Endoscopy;  Laterality: N/A;  930  . CRANIECTOMY SUBOCCIPITAL W/ CERVICAL LAMINECTOMY / CHIARI  2007   Repair of Arnold-Chiari malformation  . JOINT REPLACEMENT Left 2011  . KNEE ARTHROSCOPY  1995   Dr. Durward Fortes   . THYROIDECTOMY, PARTIAL  2004   Left lobectomy - large  benign nodule  . TONSILLECTOMY    . TOTAL KNEE ARTHROPLASTY  07/2009   Left knee , Dr. Mardelle Matte   . TOTAL KNEE ARTHROPLASTY Right 07/13/2013   Procedure: TOTAL KNEE ARTHROPLASTY;  Surgeon: Johnny Bridge, MD;  Location: [place];  Service: Orthopedics;  Laterality: Right;   Family History:  Family History  Problem Relation Age of Onset  . Diabetes Mother   . Depression Mother   . Diabetes Father        emphsema   . Hyperlipidemia Father   . Depression Other   . Colon cancer Neg Hx    Family Psychiatric  History: See H&P Social  History:  Social History   Substance and Sexual Activity  Alcohol Use Yes  . Alcohol/week: 0.0 oz   Comment: once a month -glass or shot      Social History   Substance and Sexual Activity  Drug Use No    Social History   Socioeconomic History  . Marital status: Divorced    Spouse name: Not on file  . Number of children: 3  . Years of education: Not on file  . Highest education level: Not on file  Occupational History  . Occupation: Employed seeking [employer]  . Financial resource strain: Not on file  . Food insecurity:    Worry: Not on file    Inability: Not on file  . Transportation needs:    Medical: Not on file    Non-medical: Not on file  Tobacco Use  . Smoking status: Never Smoker  . Smokeless tobacco: Never Used  Substance and Sexual Activity  . Alcohol use: Yes    Alcohol/week: 0.0 oz    Comment: once a month -glass or shot   . Drug use: No  . Sexual activity: Not Currently    Birth control/protection: Surgical  Lifestyle  . Physical activity:    Days per week: Not on file    Minutes per session: Not on file  . Stress: Not on file  Relationships  . Social connections:    Talks on phone: Not on file    Gets together: Not on file    Attends religious service: Not on file    Active member of club or organization: Not on file    Attends meetings of clubs or organizations: Not on file    Relationship status: Not on file  Other Topics Concern  . Not on file  Social History Narrative  . Not on file   Additional Social History:                         Sleep: Good  Appetite:  Good  Current Medications: Current Facility-Administered Medications  Medication Dose Route Frequency Provider Last Rate Last Dose  . DULoxetine (CYMBALTA) DR capsule 30 mg  30 mg Oral Daily Dempsey Ahonen, Myer Peer, MD   30 mg at 08/16/17 0808  . gabapentin (NEURONTIN) capsule 100 mg  100 mg Oral TID Christoher Drudge, Myer Peer, MD   100 mg at 08/16/17 0808  .  hydrochlorothiazide (HYDRODIURIL) tablet 25 mg  25 mg Oral Daily Rankin, Shuvon B, NP   25 mg at 08/16/17 0809  . hydrOXYzine (ATARAX/VISTARIL) tablet 25 mg  25 mg Oral Q6H PRN Kimla Furth A, MD      . temazepam (RESTORIL) capsule 15 mg  15 mg Oral QHS PRN Malayla Granberry, Myer Peer, MD        Lab Results:  Results for orders placed or performed during the hospital encounter of 08/14/17 (from the past 48 hour(s))  Rapid urine drug screen (hospital performed)     Status: None   Collection Time: 08/14/17 11:00 AM  Result Value Ref Range   Opiates NONE DETECTED NONE DETECTED   Cocaine NONE DETECTED NONE DETECTED   Benzodiazepines NONE DETECTED NONE DETECTED   Amphetamines NONE DETECTED NONE DETECTED   Tetrahydrocannabinol NONE DETECTED NONE DETECTED   Barbiturates NONE DETECTED NONE DETECTED    Comment: (NOTE) DRUG SCREEN FOR MEDICAL PURPOSES ONLY.  IF CONFIRMATION IS NEEDED FOR ANY PURPOSE, NOTIFY LAB WITHIN 5 DAYS. LOWEST DETECTABLE LIMITS FOR URINE DRUG SCREEN Drug Class                     Cutoff (ng/mL) Amphetamine and metabolites    1000 Barbiturate and metabolites    200 Benzodiazepine                 222 Tricyclics and metabolites     300 Opiates and metabolites        300 Cocaine and metabolites        300 THC                            50 Performed at [facility], [address], [place], [place] [address]   Comprehensive metabolic panel     Status: Abnormal   Collection Time: 08/14/17 11:39 AM  Result Value Ref Range   Sodium 135 135 - 145 mmol/L   Potassium 4.0 3.5 - 5.1 mmol/L   Chloride 98 (L) 101 - 111 mmol/L   CO2 29 22 - 32 mmol/L   Glucose, Bld 98 65 - 99 mg/dL   BUN 16 6 - 20 mg/dL   Creatinine, Ser 0.72 0.44 - 1.00 mg/dL   Calcium 9.7 8.9 - 10.3 mg/dL   Total Protein 8.8 (H) 6.5 - 8.1 g/dL   Albumin 4.0 3.5 - 5.0 g/dL   AST 14 (L) 15 - 41 U/L   ALT 13 (L) 14 - 54 U/L   Alkaline Phosphatase 81 38 - 126 U/L   Total Bilirubin 0.8 0.3 - 1.2 mg/dL   GFR calc  non Af Amer >60 >60 mL/min   GFR calc Af Amer >60 >60 mL/min    Comment: (NOTE) The eGFR has been calculated using the CKD EPI equation. This calculation has not been validated in all clinical situations. eGFR's persistently <60 mL/min signify possible Chronic Kidney Disease.    Anion gap 8 5 - 15    Comment: Performed at [facility], [address], [place], [place] [address]  Ethanol     Status: None   Collection Time: 08/14/17 11:39 AM  Result Value Ref Range   Alcohol, Ethyl (B) <10 <10 mg/dL    Comment: (NOTE) Lowest detectable limit for serum alcohol is 10 mg/dL. For medical purposes only. Performed at [facility], [address], [place],  [address]   cbc     Status: Abnormal   Collection Time: 08/14/17 11:39 AM  Result Value Ref Range   WBC 7.6 4.0 - 10.5 K/uL   RBC 5.55 (H) 3.87 - 5.11 MIL/uL   Hemoglobin 12.9 12.0 - 15.0 g/dL   HCT 42.4 36.0 - 46.0 %   MCV 76.4 (L) 78.0 - 100.0 fL   MCH 23.2 (L) 26.0 - 34.0 pg   MCHC 30.4 30.0 - 36.0 g/dL  RDW 15.5 11.5 - 15.5 %   Platelets 421 (H) 150 - 400 K/uL    Comment: Performed at [facility], [address], [place], [place] [address]  Acetaminophen level     Status: Abnormal   Collection Time: 08/14/17 11:39 AM  Result Value Ref Range   Acetaminophen (Tylenol), Serum <10 (L) 10 - 30 ug/mL    Comment: (NOTE) Therapeutic concentrations vary significantly. A range of 10-30 ug/mL  may be an effective concentration for many patients. However, some  are best treated at concentrations outside of this range. Acetaminophen concentrations >150 ug/mL at 4 hours after ingestion  and >50 ug/mL at 12 hours after ingestion are often associated with  toxic reactions. Performed at [facility], [address] [place], [place] [address]   Salicylate level     Status: None   Collection Time: 08/14/17 11:39 AM  Result Value Ref Range   Salicylate Lvl <7.2 2.8 - 30.0 mg/dL    Comment: Performed at [facility], [address] [place], [place] [address]  Differential     Status: None   Collection Time: 08/14/17 11:39 AM  Result Value Ref Range   Neutrophils Relative % 52 %   Neutro Abs 3.8 1.7 - 7.7 K/uL   Lymphocytes Relative 41 %   Lymphs Abs 3.0 0.7 - 4.0 K/uL   Monocytes Relative 6 %   Monocytes Absolute 0.4 0.1 - 1.0 K/uL   Eosinophils Relative 1 %   Eosinophils Absolute 0.1 0.0 - 0.7 K/uL   Basophils Relative 0 %   Basophils Absolute 0.0 0.0 - 0.1 K/uL    Comment: Performed at [facility], [address] [place], [place] [address]    Blood Alcohol level:  Lab Results  Component Value Date   ETH <10 08/14/2017   [facility]  05/17/2008    <5        LOWEST DETECTABLE LIMIT FOR SERUM ALCOHOL IS 5 mg/dL FOR MEDICAL PURPOSES ONLY    Metabolic Disorder Labs: Lab Results  Component Value Date   HGBA1C 6.0 (H) 04/11/2017   MPG 126 04/11/2017   MPG 120 10/02/2016   No results found for: PROLACTIN Lab Results  Component Value Date   CHOL 124 04/11/2017   TRIG 52 04/11/2017   HDL 51 04/11/2017   CHOLHDL 2.4 04/11/2017   VLDL 12 10/02/2016   LDLCALC 60 04/11/2017   LDLCALC 92 10/02/2016    Physical Findings: AIMS: Facial and Oral Movements Muscles of Facial Expression: None, normal Lips and Perioral Area: None, normal Jaw: None, normal Tongue: None, normal,Extremity Movements Upper (arms, wrists, hands, fingers): None, normal Lower (legs, knees, ankles, toes): None, normal, Trunk Movements Neck, shoulders, hips: None, normal, Overall Severity Severity of abnormal movements (highest score from questions above): None, normal Incapacitation due to abnormal movements: None, normal Patient's awareness of abnormal movements (rate only patient's report): No Awareness, Dental Status Current problems with teeth and/or dentures?: No Does patient usually wear dentures?: No  CIWA:    COWS:     Musculoskeletal: Strength & Muscle Tone: within normal limits Gait & Station: normal Patient leans:  N/A  Psychiatric Specialty Exam: Physical Exam  Nursing note and vitals reviewed. Constitutional: She is oriented to person, place, and time. She appears well-developed and well-nourished.  Cardiovascular: Normal rate.  Respiratory: Effort normal.  Musculoskeletal: Normal range of motion.  Neurological: She is alert and oriented to person, place, and time.  Skin: Skin is warm.    Review of Systems  Constitutional: Negative.   HENT: Negative.   Eyes: Negative.   Respiratory: Negative.   Cardiovascular: Negative.   Gastrointestinal: Negative.   Genitourinary: Negative.   Musculoskeletal: Negative.   Skin: Negative.   Neurological: Negative.   Endo/Heme/Allergies: Negative.   Psychiatric/Behavioral: Negative.     Blood pressure 113/77, pulse 70, temperature 98.9 F (37.2 C), temperature source Oral, resp. rate 20, height '5\' 2"'$  (1.575 m), weight (!) 161.5 kg (356 lb).Body mass index is 65.11 kg/m.  General Appearance: Casual  Eye Contact:  Good  Speech:  Clear and Coherent and Normal Rate  Volume:  Normal  Mood:  Euthymic  Affect:  Congruent  Thought Process:  Goal Directed and Descriptions of Associations: Intact  Orientation:  Full (Time, Place, and Person)  Thought Content:  WDL  Suicidal Thoughts:  No  Homicidal Thoughts:  No  Memory:  Immediate;   Good Recent;   Good Remote;   Good  Judgement:  Fair  Insight:  Good  Psychomotor Activity:  Normal  Concentration:  Concentration: Good and Attention Span: Good  Recall:  Good  Fund of Knowledge:  Good  Language:  Good  Akathisia:  No  Handed:  Right  AIMS (if indicated):     Assets:  Communication Skills Desire for Improvement Financial Resources/Insurance Housing Physical Health Social Support Transportation  ADL's:  Intact  Cognition:  WNL  Sleep:  Number of Hours: 6.75   Problems addressed MDD severe  Treatment Plan Summary: Daily contact with patient to assess and evaluate symptoms and progress in  treatment, Medication management and Plan is to: -Continue Cymbalta 30 mg p.o. daily for mood stability -Continue Neurontin 100 mg p.o. 3 times daily for pain and anxiety -Continue Vistaril 25 mg p.o. every 6 hours as needed for anxiety -Continue Restoril 15 mg p.o. nightly as needed for insomnia -Encourage group therapy participation -Potential discharge for tomorrow  Lewis Shock, FNP 08/16/2017, 9:02 AM    ..Marland KitchenAgree with NP Progress Note

## 2017-08-16 NOTE — Progress Notes (Signed)
D Pt is observed standing at the med window this morning at 0800. She endorses a flat, depressed affect. She avoids making eye contact with this Clinical research associate. She is indifferent as she answers this writer's assessment questions, offering one and two word answers, she says " yes " and / or "No" frequently and chooses not to elaborate.     A She did complete her daily assessment and on this she wrote she  She denied SI today and she rated her depression, hopelessness and anxeity " 4/2/0", respectively. She says " I don't know" in response to writer's question about =what she want to focus on today.    R Safety is in place.

## 2017-08-16 NOTE — BHH Group Notes (Signed)
LCSW Group Therapy Note  08/16/2017    9:15 - 10:15 AM               Type of Therapy and Topic:  Group Therapy: Anger Cues and Responses  Participation Level:  Active  In this group, patients learned how to recognize the physical, cognitive, emotional, and behavioral responses they have to anger-provoking situations.  They identified a recent time they became angry and how they reacted.  They analyzed how their reaction was possibly beneficial and how it was possibly unhelpful.  The group discussed anger warning signs and how to know when our anger can potentially become a problem. The group will discuss how our thoughts impact our feelings which in result affect our behaviors. Patients will learn thought replacement and explore alternative emotions in addition to feeling anger. Patients will share with the group and learn from CSW but also other patients coping skills that work for others.   Therapeutic Goals: 1. Patients will remember their last incident of anger and how they felt emotionally and physically, what their thoughts were at the time, and how they behaved. 2. Patients will identify how their behavior at that time worked for them, as well as how it worked against them. 3. Patients will explore how their body, mind and feelings play a role with anger. 4. Patients will learn that anger itself is normal and cannot be eliminated, and that healthier reactions can assist with resolving conflict rather than worsening situations. 5. Patients will learn thought replacement and discuss positive ways to cope with anger.   Summary of Patient Progress:  Patient was engaged and participated throughout the group session. The patient shared that her most recent time of anger was when her daughter's two year old pulled up her planted flowers. Patient expressed also feeling pissed and going off. Patient identified other things like her daughter being out of work that led up to the explosion. Patient was  supportive of a peer that shared a difficult situation they were going through.   Therapeutic Modalities:   Cognitive Behavioral Therapy  Shellia Cleverly, LCSW  08/16/2017 5:02 PM

## 2017-08-17 MED ORDER — DULOXETINE HCL 30 MG PO CPEP: 30.0000 mg | ORAL_CAPSULE | Freq: Every day | ORAL | 0 refills | Status: DC

## 2017-08-17 MED ORDER — HYDROXYZINE HCL 50 MG PO TABS: 50.0000 mg | ORAL_TABLET | Freq: Four times a day (QID) | ORAL | 0 refills | Status: DC | PRN

## 2017-08-17 MED ORDER — HYDROXYZINE HCL 50 MG PO TABS: 50.0000 mg | ORAL_TABLET | Freq: Four times a day (QID) | ORAL | Status: DC | PRN

## 2017-08-17 MED ORDER — TEMAZEPAM 15 MG PO CAPS: 15.0000 mg | ORAL_CAPSULE | Freq: Every evening | ORAL | 0 refills | Status: DC | PRN

## 2017-08-17 MED ORDER — HYDROXYZINE HCL 25 MG PO TABS
25.0000 mg | ORAL_TABLET | Freq: Once | ORAL | Status: AC
  Administered 2017-08-17: 25 mg via ORAL
  Filled 2017-08-17 (×2): qty 1

## 2017-08-17 MED ORDER — GABAPENTIN 100 MG PO CAPS: 100.0000 mg | ORAL_CAPSULE | Freq: Three times a day (TID) | ORAL | 0 refills | Status: DC

## 2017-08-17 NOTE — Progress Notes (Signed)
  [facility] Adult Case Management Discharge Plan :  Will you be returning to the same living situation after discharge:  Yes,  with family At discharge, do you have transportation home?: Yes,  daughter Do you have the ability to pay for your medications: Yes,  no issues raised  Release of information consent forms completed and turned in to Medical Records by CSW.   Patient to Follow up at: Follow-up Information    [facility] Follow up on 08/25/2017.   Specialty:  Behavioral Health Why:  Appointment for psychiatry is 08/25/17 and appointment with Peggy for therapy is 08/29/17. Contact information: [address] [place] [address] [phone]          Next level of care provider has access to [facility] Link:yes  Safety Planning and Suicide Prevention discussed: Yes,  with sister  Have you used any form of tobacco in the last 30 days? (Cigarettes, Smokeless Tobacco, Cigars, and/or Pipes): No  Has patient been referred to the Quitline?: N/A patient is not a smoker  Patient has been referred for addiction treatment: N/A  Lynnell Chad, LCSW 08/17/2017, 11:08 AM

## 2017-08-17 NOTE — Discharge Summary (Addendum)
Physician Discharge Summary Note  Patient:  [name] is an 57 y.o., female MRN:  [id number] DOB:  Apr 07, 1960 Patient phone:  [phone] (home)  Patient address:   [address] [place] [address],  Total Time spent with patient: 20 minutes  Date of Admission:  08/14/2017 Date of Discharge: 08/17/17  Reason for Admission:  Worsening depression with SI  Principal Problem: MDD (major depressive disorder), severe [facility]) Discharge Diagnoses: Patient Active Problem List   Diagnosis Date Noted  . MDD (major depressive disorder), severe (HCC) [F32.2] 08/14/2017  . Back pain with sciatica [M54.9, M54.30] 11/13/2015  . Prediabetes [R73.03] 07/31/2015  . Annual physical exam [Z00.00] 04/09/2015  . Knee osteoarthritis [M17.10] 07/13/2013  . Chronic diastolic heart failure (HCC) [I50.32] 01/06/2013  . Snoring disorder [R06.83] 01/03/2013  . Thoracic spine pain [M54.6] 12/29/2012  . Vitamin D deficiency [E55.9] 12/08/2012  . GERD (gastroesophageal reflux disease) [K21.9] 07/15/2012  . Exercise intolerance [R68.89] 11/26/2011  . Essential hypertension [I10] 11/26/2011  . Depression [F32.9]   . 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 Anemia [D64.9] 07/03/2011  . Morbid obesity (HCC) [E66.01] 12/03/2007    Past Psychiatric History: she has had two prior psychiatric admissions, last time was about 15 years ago, for depression. States she has never attempted suicide, denies history of suicide attempts, denies history of mania/hypomania, denies history of psychosis, endorses some symptoms of PTSD which have improved overtime. She reports occasional panic attacks.  Past Medical History:  Past Medical History:  Diagnosis Date  . Chronic back pain   . Degenerative joint disease    Left TKA in 07/2009-Dr. Landau  . Depression 2001   doesn't take any meds  . Headache(784.0)    occasionally  . Hypothyroidism    was on Levothyroxine-has been off x 4 months  . Joint pain    . Joint swelling   . Morbid obesity (HCC)   . Nocturia   . Osteoarthritis of right knee 07/13/2013  . Peripheral edema    to right leg;takes Furosemide occasionally and hasnt had one in a month  . Syncope    with palpitations; evaluated by [facility] in 2008 with normal echo    Past Surgical History:  Procedure Laterality Date  . ABDOMINAL HYSTERECTOMY  1995    no oophorectomy  . CARPAL TUNNEL RELEASE  1991   Bilateral  . CESAREAN SECTION     X2  . CHOLECYSTECTOMY    . CHOLECYSTECTOMY, LAPAROSCOPIC  2007   Dr. Lovell Sheehan  . COLONOSCOPY N/A 03/03/2013   Procedure: COLONOSCOPY;  Surgeon: Malissa Hippo, MD;  Location: [facility]ITE;  Service: Endoscopy;  Laterality: N/A;  930  . CRANIECTOMY SUBOCCIPITAL W/ CERVICAL LAMINECTOMY / CHIARI  2007   Repair of Arnold-Chiari malformation  . JOINT REPLACEMENT Left 2011  . KNEE ARTHROSCOPY  1995   Dr. Cleophas Dunker   . THYROIDECTOMY, PARTIAL  2004   Left lobectomy - large benign nodule  . TONSILLECTOMY    . TOTAL KNEE ARTHROPLASTY  07/2009   Left knee , Dr. Dion Saucier   . TOTAL KNEE ARTHROPLASTY Right 07/13/2013   Procedure: TOTAL KNEE ARTHROPLASTY;  Surgeon: Eulas Post, MD;  Location: [facility] OR;  Service: Orthopedics;  Laterality: Right;   Family History:  Family History  Problem Relation Age of Onset  . Diabetes Mother   . Depression Mother   . Diabetes Father        emphsema   . Hyperlipidemia Father   . Depression  Other   . Colon cancer Neg Hx    Family Psychiatric  History: maternal cousin committed suicide, mother had depression, no substance abuse or alcohol abuse in family   Social History:  Social History   Substance and Sexual Activity  Alcohol Use Yes  . Alcohol/week: 0.0 oz   Comment: once a month -glass or shot      Social History   Substance and Sexual Activity  Drug Use No    Social History   Socioeconomic History  . Marital status: Divorced    Spouse name: Not on file  . Number of  children: 3  . Years of education: Not on file  . Highest education level: Not on file  Occupational History  . Occupation: Employed seeking disabilty 2012  Social Needs  . Financial resource strain: Not on file  . Food insecurity:    Worry: Not on file    Inability: Not on file  . Transportation needs:    Medical: Not on file    Non-medical: Not on file  Tobacco Use  . Smoking status: Never Smoker  . Smokeless tobacco: Never Used  Substance and Sexual Activity  . Alcohol use: Yes    Alcohol/week: 0.0 oz    Comment: once a month -glass or shot   . Drug use: No  . Sexual activity: Not Currently    Birth control/protection: Surgical  Lifestyle  . Physical activity:    Days per week: Not on file    Minutes per session: Not on file  . Stress: Not on file  Relationships  . Social connections:    Talks on phone: Not on file    Gets together: Not on file    Attends religious service: Not on file    Active member of club or organization: Not on file    Attends meetings of clubs or organizations: Not on file    Relationship status: Not on file  Other Topics Concern  . Not on file  Social History Narrative  . Not on file    Hospital Course:   08/15/17 [facility] MD Assessment: 57 year old female . Presented to the ED at the encouragement of her therapist, Ms. Bynum , who she sees for individual therapy. She reports history of depression and states it has been worsening recently. She reports she has had increasing suicidal ideations recently, with thoughts of wanting to go to sleep and not wake up, but denies plan or intention. Endorses neuro-vegetative symptoms of depression as below. Denies psychotic symptoms. Attributes her depression in part to death of loved ones . States she lost her father 3 years ago, who died in a tractor accident, mother passed away April 06, 2017. (Patient was her primary care giver) . She is also facing significant financial issues and facing possible foreclosure  of her home ( although patient not currently living there, a daughter is , whom she states  was supposed to help financially but has not ).  Patient remained on the [facility] unit for 3 days. The patient stabilized on medication and therapy. Patient was discharged on Cymbalta 30 mg Daily, Neurontin 100 mg TID, Vistaril 50 mg Q6H PRN, and Restoril 15 mg QHS PRN. Patient has shown improvement with improved mood, affect, sleep, appetite, and interaction. Patient has attended group and participated. Patient has been seen in the day room interacting with peers and staff appropriately. Patient denies any SI/HI/AVH and contracts for safety. Patient agrees to follow up at [facility].  Patient is provided with prescriptions for their medications upon discharge.    Physical Findings: AIMS: Facial and Oral Movements Muscles of Facial Expression: None, normal Lips and Perioral Area: None, normal Jaw: None, normal Tongue: None, normal,Extremity Movements Upper (arms, wrists, hands, fingers): None, normal Lower (legs, knees, ankles, toes): None, normal, Trunk Movements Neck, shoulders, hips: None, normal, Overall Severity Severity of abnormal movements (highest score from questions above): None, normal Incapacitation due to abnormal movements: None, normal Patient's awareness of abnormal movements (rate only patient's report): No Awareness, Dental Status Current problems with teeth and/or dentures?: No Does patient usually wear dentures?: No  CIWA:    COWS:     Musculoskeletal: Strength & Muscle Tone: within normal limits Gait & Station: normal Patient leans: N/A  Psychiatric Specialty Exam: Physical Exam  Nursing note and vitals reviewed. Constitutional: She is oriented to person, place, and time. She appears well-developed and well-nourished.  Cardiovascular: Normal rate.  Respiratory: Effort normal.  Musculoskeletal: Normal range of motion.  Neurological: She is alert and  oriented to person, place, and time.  Skin: Skin is warm.    Review of Systems  Constitutional: Negative.   HENT: Negative.   Eyes: Negative.   Respiratory: Negative.   Cardiovascular: Negative.   Gastrointestinal: Negative.   Genitourinary: Negative.   Musculoskeletal: Negative.   Skin: Negative.   Neurological: Negative.   Endo/Heme/Allergies: Negative.   Psychiatric/Behavioral: Negative.     Blood pressure 113/77, pulse 70, temperature 98.9 F (37.2 C), temperature source Oral, resp. rate 20, height 5\' 2"  (1.575 m), weight (!) 161.5 kg (356 lb).Body mass index is 65.11 kg/m.  General Appearance: Casual  Eye Contact:  Good  Speech:  Clear and Coherent and Normal Rate  Volume:  Normal  Mood:  Euthymic  Affect:  Congruent  Thought Process:  Goal Directed and Descriptions of Associations: Intact  Orientation:  Full (Time, Place, and Person)  Thought Content:  WDL  Suicidal Thoughts:  No  Homicidal Thoughts:  No  Memory:  Immediate;   Good Recent;   Good Remote;   Good  Judgement:  Good  Insight:  Good  Psychomotor Activity:  Normal  Concentration:  Concentration: Good and Attention Span: Good  Recall:  Good  Fund of Knowledge:  Good  Language:  Good  Akathisia:  No  Handed:  Right  AIMS (if indicated):     Assets:  Communication Skills Desire for Improvement Financial Resources/Insurance Housing Physical Health Social Support Transportation  ADL's:  Intact  Cognition:  WNL  Sleep:  Number of Hours: 6.25     Have you used any form of tobacco in the last 30 days? (Cigarettes, Smokeless Tobacco, Cigars, and/or Pipes): No  Has this patient used any form of tobacco in the last 30 days? (Cigarettes, Smokeless Tobacco, Cigars, and/or Pipes) Yes, No  Blood Alcohol level:  Lab Results  Component Value Date   ETH <10 08/14/2017   ETH  05/17/2008    <5        LOWEST DETECTABLE LIMIT FOR SERUM ALCOHOL IS 5 mg/dL FOR MEDICAL PURPOSES ONLY    Metabolic Disorder  Labs:  Lab Results  Component Value Date   HGBA1C 6.0 (H) 04/11/2017   MPG 126 04/11/2017   MPG 120 10/02/2016   No results found for: PROLACTIN Lab Results  Component Value Date   CHOL 124 04/11/2017   TRIG 52 04/11/2017   HDL 51 04/11/2017   CHOLHDL 2.4 04/11/2017   VLDL 12 10/02/2016   LDLCALC  60 04/11/2017   LDLCALC 92 10/02/2016    See Psychiatric Specialty Exam and Suicide Risk Assessment completed by Attending Physician prior to discharge.  Discharge destination:  Home  Is patient on multiple antipsychotic therapies at discharge:  No   Has Patient had three or more failed trials of antipsychotic monotherapy by history:  No  Recommended Plan for Multiple Antipsychotic Therapies: NA   Allergies as of 08/17/2017      Reactions   Aleve [naproxen Sodium] Shortness Of Breath   Penicillins Swelling   .Has patient had a PCN reaction causing immediate rash, facial/tongue/throat swelling, SOB or lightheadedness with hypotension: Yes Has patient had a PCN reaction causing severe rash involving mucus membranes or skin necrosis: No Has patient had a PCN reaction that required hospitalization: No Has patient had a PCN reaction occurring within the last 10 years: No If all of the above answers are "NO", then may proceed with Cephalosporin use.   Adhesive [tape]    rash   Latex Rash      Medication List    STOP taking these medications   FLUoxetine 20 MG tablet Commonly known as:  PROZAC   phentermine 37.5 MG tablet Commonly known as:  ADIPEX-P     TAKE these medications     Indication  DULoxetine 30 MG capsule Commonly known as:  CYMBALTA Take 1 capsule (30 mg total) by mouth daily. For mood stability  Indication:  mood stability   gabapentin 100 MG capsule Commonly known as:  NEURONTIN Take 1 capsule (100 mg total) by mouth 3 (three) times daily. For anxiety What changed:    medication strength  how much to take  when to take this  additional  instructions  Indication:  anxiety   hydrochlorothiazide 25 MG tablet Commonly known as:  HYDRODIURIL Take 1 tablet (25 mg total) by mouth daily.  Indication:  High Blood Pressure Disorder   hydrOXYzine 50 MG tablet Commonly known as:  ATARAX/VISTARIL Take 1 tablet (50 mg total) by mouth every 6 (six) hours as needed for anxiety.  Indication:  Feeling Anxious   temazepam 15 MG capsule Commonly known as:  RESTORIL Take 1 capsule (15 mg total) by mouth at bedtime as needed for sleep. What changed:    medication strength  how much to take  Indication:  Trouble Sleeping      Follow-up Information    [facility] Follow up.   Specialty:  Behavioral Health Why:  Appointments for psychiatry and therapy are needed.  Your social worker will make these appointments and call you Monday with the information. Contact information: [address] [place] [address] [phone]          Follow-up recommendations:  Continue activity as tolerated. Continue diet as recommended by your PCP. Ensure to keep all appointments with outpatient providers.  Comments:  Patient is instructed prior to discharge to: Take all medications as prescribed by his/her mental healthcare provider. Report any adverse effects and or reactions from the medicines to his/her outpatient provider promptly. Patient has been instructed & cautioned: To not engage in alcohol and or illegal drug use while on prescription medicines. In the event of worsening symptoms, patient is instructed to call the crisis hotline, 911 and or go to the nearest ED for appropriate evaluation and treatment of symptoms. To follow-up with his/her primary care provider for your other medical issues, concerns and or health care needs.    Signed: Gerlene Burdock Money,  FNP 08/17/2017, 10:55 AM   Patient seen, Suicide Assessment Completed.  Disposition Plan Reviewed

## 2017-08-17 NOTE — Progress Notes (Signed)
Pt completed her daily assessment and on this she wrote she denied SI today an she rated her depression, hopelessness and anxiety " 2/0/0/", respectively. All dc instrucitons are given to her by this writer ( SRA, AVS, SSP and transition record) and the belongings in her locker are returned to her as well. She is escorted to bldg entrance and dc'd to home per MD order. Daughter here to transport home.

## 2017-08-17 NOTE — [facility] Suicide Risk Assessment (Signed)
[facility] INPATIENT:  Family/Significant Other Suicide Prevention Education  Suicide Prevention Education:  Contact Attempts: Sister Cornelia Freida Busman [phone] has been identified by the patient as the family member/significant other with whom the patient will be residing, and identified as the person(s) who will aid the patient in the event of a mental health crisis.  With written consent from the patient, two attempts were made to provide suicide prevention education, prior to and/or following the patient's discharge.  We were unsuccessful in providing suicide prevention education.  A suicide education pamphlet was given to the patient to share with family/significant other.  Date and time of first attempt:  08/17/2017  /9:30 AM  - message left Date and time of second attempt:   Successful on 08/17/17  [name] 08/17/2017, 9:30 AM

## 2017-08-17 NOTE — [facility] Suicide Risk Assessment (Signed)
[facility] INPATIENT:  Family/Significant Other Suicide Prevention Education  Suicide Prevention Education:  Education Completed; Sister [name] [phone] has been identified by the patient as the family member/significant other with whom the patient will be residing, and identified as the person(s) who will aid the patient in the event of a mental health crisis (suicidal ideations/suicide attempt).  With written consent from the patient, the family member/significant other has been provided the following suicide prevention education, prior to the and/or following the discharge of the patient.  The suicide prevention education provided includes the following:  Suicide risk factors  Suicide prevention and interventions  National Suicide Hotline telephone number  [facility] assessment telephone number  [facility] Emergency Assistance 911  [facility] and/or Residential Mobile Crisis Unit telephone number  Request made of family/significant other to:  Remove weapons (e.g., guns, rifles, knives), all items previously/currently identified as safety concern.    Remove drugs/medications (over-the-counter, prescriptions, illicit drugs), all items previously/currently identified as a safety concern.  The family member/significant other verbalizes understanding of the suicide prevention education information provided.  The family member/significant other agrees to remove the items of safety concern listed above.  SISTER STATED THAT AT ONE TIME PT HAD ACCESS TO FIREARMS, BUT IS NOT BELIEVED TO AT THIS TIME, AND SHE IS GOING TO CONFIRM THIS WITH PT.  [name] 08/17/2017, 1:05 PM

## 2017-08-17 NOTE — [facility] Group Notes (Signed)
Identifying Needs  Date:  08/17/2017  Time:  3:01 PM  Type of Therapy:  Nurse Education  /  The group focuses on teaching patients how to identify their needs and then how to develop skills needed to get some of those needs met in a healthier fashion.  Participation Level:  Active  Participation Quality:  Appropriate  Affect:  Appropriate  Cognitive:  Oriented  Insight:  Good  Engagement in Group:  Engaged  Modes of Intervention:  Education  Summary of Progress/Problems:  Lauralyn Primes 08/17/2017, 3:01 PM

## 2017-08-17 NOTE — Progress Notes (Signed)
Patient ID: [name], female   DOB: [birth year], 57 y.o.   MRN: [id number] DAR Note: Pt observed in the dayroom interacting with peers. Pt at assessment endorsed moderate depression and anxiety; "I am ready to go home, I hope the doctor will let me go tomorrow." Pt denied SI/HI, pain or AVH. Pt contracts for safety. Medications offered as prescribed. All patient's questions and concerns addressed. Support, encouragement, and safe environment provided. Will continue to monitor for any changes. Pt was med compliant. Pt attended wrap-up group. Safety checks continue.

## 2017-08-17 NOTE — [facility] Suicide Risk Assessment (Signed)
[facility] Discharge Suicide Risk Assessment   Principal Problem: MDD (major depressive disorder), severe [facility]) Discharge Diagnoses:  Patient Active Problem List   Diagnosis Date Noted  . MDD (major depressive disorder), severe (HCC) [F32.2] 08/14/2017  . Back pain with sciatica [M54.9, M54.30] 11/13/2015  . Prediabetes [R73.03] 07/31/2015  . Annual physical exam [Z00.00] 04/09/2015  . Knee osteoarthritis [M17.10] 07/13/2013  . Chronic diastolic heart failure (HCC) [I50.32] 01/06/2013  . Snoring disorder [R06.83] 01/03/2013  . Thoracic spine pain [M54.6] 12/29/2012  . Vitamin D deficiency [E55.9] 12/08/2012  . GERD (gastroesophageal reflux disease) [K21.9] 07/15/2012  . Exercise intolerance [R68.89] 11/26/2011  . Essential hypertension [I10] 11/26/2011  . Depression [F32.9]   . 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 Anemia [D64.9] 07/03/2011  . Morbid obesity (HCC) [E66.01] 12/03/2007    Total Time spent with patient: 30 minutes  Musculoskeletal: Strength & Muscle Tone: within normal limits Gait & Station: normal Patient leans: N/A  Psychiatric Specialty Exam: ROS denies headache, no chest pain, no shortness of breath, no vomiting   Blood pressure 113/77, pulse 70, temperature 98.9 F (37.2 C), temperature source Oral, resp. rate 20, height 5\' 2"  (1.575 m), weight (!) 161.5 kg (356 lb).Body mass index is 65.11 kg/m.  General Appearance: Well Groomed  ::  improved  Speech:  Normal Rate409  Volume:  Normal  Mood:  improved, states " I feel a lot better"  Affect:  more reactive, brighter   Thought Process:  Linear and Descriptions of Associations: Intact  Orientation:  Full (Time, Place, and Person)  Thought Content:  no hallucinations, no delusions, not internally preoccupied   Suicidal Thoughts:  No today denies suicidal or self injurious ideations, denies any homicidal or violent ideations  Homicidal Thoughts:  No  Memory:  recent and remote grossly  intact   Judgement:  Other:  improving  Insight:  improving   Psychomotor Activity:  Normal  Concentration:  Good  Recall:  Good  Fund of Knowledge:Good  Language: Good  Akathisia:  Negative  Handed:  Right  AIMS (if indicated):     Assets:  Communication Skills Desire for Improvement Resilience  Sleep:  Number of Hours: 6.25  Cognition: WNL  ADL's:  Intact   Mental Status Per Nursing Assessment::   On Admission:  Suicidal ideation indicated by patient, Intention to act on suicide plan  Demographic Factors:  57 year old single female , lives with an adult daughter, part time employment   Loss Factors: Death of loved ones, father died 2 years ago, and mother died earlier this year, some financial concerns  Historical Factors: History of depression, history of prior psychiatric admissions, most recently 15 years ago , denies prior suicide attempts  Risk Reduction Factors:   Responsible for children under 73 years of age, Employed, Living with another person, especially a relative and Positive coping skills or problem solving skills  Continued Clinical Symptoms:  At this time patient is alert, attentive, well related, pleasant, mood improved, states " I feel a lot better", affect appropriate and fuller in range, no thought disorder, no suicidal or self injurious ideations, no homicidal or violent ideations, no psychotic symptoms , future oriented. Denies medication side effects. Has been visible, interactive in milieu, presents calm, pleasant on approach.  Cognitive Features That Contribute To Risk:  No gross cognitive deficits noted upon discharge. Is alert , attentive, and oriented x 3   Suicide Risk:  Mild:  Suicidal ideation of limited frequency, intensity, duration, and specificity.  There are no identifiable plans, no associated intent, mild dysphoria and related symptoms, good self-control (both objective and subjective assessment), few other risk factors, and  identifiable protective factors, including available and accessible social support.    Plan Of Care/Follow-up recommendations:  Activity:  as tolerated Diet:  heart healthy Tests:  NA Other:  See below  Patient is requesting discharge and there are no current grounds for involuntary commitment. Leaving unit in good spirits, plans to return home. Plans to follow up with her therapist and PCP.  Craige Cotta, MD 08/17/2017, 9:23 AM

## 2017-08-17 NOTE — [facility] Group Notes (Signed)
LCSW Group Therapy Note  08/17/2017    9:30 -10:30 AM               Type of Therapy and Topic:  Group Therapy: Establishing Boundaries  Participation Level:  Active  In this group, patients learned how to define boundaries, discussed the different types or boundaries with examples.  They identified times that boundaries had been violated and how they reacted.  They analyzed how their reaction was possibly beneficial and how it was possibly unhelpful.  The group discussed how to set boundaries, respect others boundaries and communicate their boundaries. The group utilized a role play scenarios (working in groups) and discussed how each person in the scenario could have reacted differently and what boundaries they need to implement to improve their life. Patients discussed how to establish boundaries with clear consequences. Patients will explore discussion questions that address media influence and why it is hard to set boundaries.   Therapeutic Goals: 1. Patients will define boundaries and explore (physical, personal space and language boundaries)  2. Patients will remember their last incident where their boundaries were violated and how they behaved 3. Patients will practice empathy and understanding of other's boundaries and learn from others in group 4. Patients will explore how they may have crossed another person's boundaries in the past.  5. Patients will learn healthy ways to set and communicate boundaries. 6. Patients will actively engage in group activity utilizing role play   Summary of Patient Progress:  Patient was engaged and participated throughout the group session. Patient participated in the small group activity and the larger discussion. Patient shared she learned it is okay to say no and set those boundaries. Patient reports needing to set boundaries at home with her children to improve her life because they take advantage of her. Patient was able to relate to her peers and  empathize with others.   Therapeutic Modalities:   Cognitive Behavioral Therapy  Shellia Cleverly, LCSW  08/17/2017 11:55 AM

## 2017-08-18 ENCOUNTER — Telehealth: Payer: Self-pay

## 2017-08-18 ENCOUNTER — Encounter: Payer: Self-pay | Admitting: Family Medicine

## 2017-08-18 NOTE — Progress Notes (Signed)
   [name]     MRN: [id number]      DOB: [birth year]   HPI [name] is here for follow up and re-evaluation of chronic medical conditions, medication management and review of any available recent lab and radiology data.     Questions or concerns regarding consultations or procedures which the PT has had in the interim are  addressed. At visit today, pt reports severe depression with suicidal  Thoughts in the past 1 week with no active   ROS Denies recent fever or chills. Denies sinus pressure, nasal congestion, ear pain or sore throat. Denies chest congestion, productive cough or wheezing. Denies chest pains, palpitations and leg swelling Denies abdominal pain, nausea, vomiting,diarrhea or constipation.   Denies dysuria, frequency, hesitancy or incontinence. Denies joint pain, swelling and limitation in mobility. Denies headaches, seizures, numbness, or tingling. . Denies skin break down or rash.   PE  BP 114/66 (BP Location: Left Arm, Patient Position: Sitting) Comment (Cuff Size): thigh  Pulse (!) 58   Ht 5\' 1"  (1.549 m)   Wt (!) 356 lb 0.6 oz (161.5 kg)   SpO2 100%   BMI 67.27 kg/m   Patient alert and oriented and in no cardiopulmonary distress.  HEENT: No facial asymmetry, EOMI,   oropharynx pink and moist.  Neck supple no JVD, no mass.  Chest: Clear to auscultation bilaterally.  CVS: S1, S2 no murmurs, no S3.Regular rate.  ABD: Soft non tender.   Ext: No edema  MS: Adequate ROM spine, shoulders, hips and knees.  Skin: Intact, no ulcerations or rash noted.  Psych: Good eye contact,Tearful affect. Memory intact  Anxious and tearful depressed appearing.  CNS: CN 2-12 intact, power,  normal throughout.no focal deficits noted.   Assessment & Plan  MDD (major depressive disorder), severe (HCC) Pt actively having suicidal thoughts this past week, needs face to face consult, will likely benefit from inpatient stay  Essential  hypertension Controlled, no change in medication   Morbid obesity Improved, continue phentermine as before. Patient re-educated about  the importance of commitment to a  minimum of 150 minutes of exercise per week.  . Goals set by the patient for the next several months.   Weight /BMI 08/14/2017 08/14/2017 04/07/2017  WEIGHT 356 lb 356 lb 0.6 oz 365 lb  HEIGHT 5\' 2"  5\' 1"  5\' 2"   BMI 65.11 kg/m2 67.27 kg/m2 66.76 kg/m2  Some encounter information is confidential and restricted. Go to Review Flowsheets activity to see all data.

## 2017-08-18 NOTE — Telephone Encounter (Signed)
Not on medlist- ok to refill?

## 2017-08-18 NOTE — Telephone Encounter (Signed)
Completed patient's toc call and during the call patient requested her Ibuprofen be refilled. I told her I would send Dr.Simpson a message.

## 2017-08-18 NOTE — Assessment & Plan Note (Signed)
Pt actively having suicidal thoughts this past week, needs face to face consult, will likely benefit from inpatient stay

## 2017-08-18 NOTE — Assessment & Plan Note (Signed)
Controlled, no change in medication  

## 2017-08-18 NOTE — Progress Notes (Signed)
CSW contacted pt by phone to confirm that she had her follow up appt information.  Pt did have the information for both medication management and for therapy. Garner Nash, MSW, LCSW Clinical Social Worker 08/18/2017 10:09 AM

## 2017-08-18 NOTE — Assessment & Plan Note (Addendum)
Improved, continue phentermine as before. Patient re-educated about  the importance of commitment to a  minimum of 150 minutes of exercise per week.  . Goals set by the patient for the next several months.   Weight /BMI 08/14/2017 08/14/2017 04/07/2017  WEIGHT 356 lb 356 lb 0.6 oz 365 lb  HEIGHT 5\' 2"  5\' 1"  5\' 2"   BMI 65.11 kg/m2 67.27 kg/m2 66.76 kg/m2  Some encounter information is confidential and restricted. Go to Review Flowsheets activity to see all data.

## 2017-08-18 NOTE — Telephone Encounter (Signed)
Transition Care Management Follow-up Telephone Call   Date discharged? 08/17/17                How have you been since you were released from the hospital? I feel better. I just got my medicine a few minutes ago from the pharmacy.   Do you understand why you were in the hospital? Yes, I was suicidal. They adjusted my medications.    Do you understand the discharge instructions? Yes   Where were you discharged to? Home   Items Reviewed:  Medications reviewed: yes  Allergies reviewed: yes  Dietary changes reviewed: yes  Referrals reviewed: No knew referrals made   Functional Questionnaire:   Activities of Daily Living (ADLs): I have help if I need it     Any transportation issues/concerns?: No   Any patient concerns? No   Confirmed importance and date/time of follow-up visits scheduled Told patient I would send front staff a message to call her and schedule a follow up appt. She understands she needs to f/u with Dr.Simpson w/in 2 weeks of her discharge date.      Confirmed with patient if condition begins to worsen call PCP or go to the ER.  Patient was given the office number and encouraged to call back with question or concerns. Yes, with verbal understanding.

## 2017-08-20 ENCOUNTER — Telehealth: Payer: Self-pay

## 2017-08-20 ENCOUNTER — Other Ambulatory Visit: Payer: Self-pay | Admitting: Family Medicine

## 2017-08-20 MED ORDER — IBUPROFEN 800 MG PO TABS
ORAL_TABLET | ORAL | 0 refills | Status: DC
Start: 2017-08-20 — End: 2017-10-06

## 2017-08-20 NOTE — Progress Notes (Signed)
Ibuprofen

## 2017-08-20 NOTE — Progress Notes (Signed)
The patient was recently admitted to [facility]. She has an initial evaluation with this note Clinical research associate in a few weeks.

## 2017-08-20 NOTE — Telephone Encounter (Signed)
I spoke with the patient, the medication has been sent in

## 2017-08-25 ENCOUNTER — Encounter (HOSPITAL_COMMUNITY): Payer: Self-pay | Admitting: Psychiatry

## 2017-08-25 ENCOUNTER — Ambulatory Visit (INDEPENDENT_AMBULATORY_CARE_PROVIDER_SITE_OTHER): Payer: Medicare HMO | Admitting: Psychiatry

## 2017-08-25 DIAGNOSIS — F331 Major depressive disorder, recurrent, moderate: Secondary | ICD-10-CM

## 2017-08-25 DIAGNOSIS — R69 Illness, unspecified: Secondary | ICD-10-CM | POA: Diagnosis not present

## 2017-08-25 NOTE — Telephone Encounter (Signed)
[facility] - left message.   Per Dr. Vanetta Shawl patient has an appt with her on 08-29-2017.  After she comes for the appt with Dr. Vanetta Shawl then she will be placed on the inactive list and the PCP will be informed.

## 2017-08-25 NOTE — Progress Notes (Signed)
..              THERAPIST PROGRESS NOTE  Session Time: Monday 08/25/2017 3:18 PM - 4:00 PM                 Participation Level: Active  Behavioral Response: CasualAlert/euthymic  Type of Therapy: Individual Therapy  Treatment Goals addressed:  appropriately grieve loss in order to normalize mood and to return to previously adaptive level of functioning  Interventions: Supportive  Summary: [name] is a 57 y.o. female who presents with a history of recurrent depression. She has had two psychiatric hospitalizations. She is a returning patient to this practice and clinician. She ws seen for medication management and outpatient psychotherapy. She is resuming services as symptoms of depression have worsened in recent months. Her 59 yo mother died on Apr 04, 2017. Patient was her caretaker.  father died 3 years ago. Patient reports additional stress related to assisting her adult children with their financial and parenting responsibilities. Patient's symptoms include depressed mood, poor concentration, tearfulness, irritability, sleep difficulty, fatigue, feelings of hopelessness and worthlessness anxiety, and excessive worrying.  Patient last was seen about 2 weeks ago. She was discharged from [facility] about a week ago. She denies having any SI since discharge. She reports loss of appetite, difficulty staying asleep at night,  and fatigue. She is scheduled to see psychiatrist Dr. Vanetta Shawl on 08/29/2017 and will discuss medication issues at that time. Patient reports family has been supportive. She says she has set limits with daughters regarding making mortgage payments. She is relieved lender has lowered the payments but is prepared to sell the house if daughters don't follow through with plan to make the payments. She states she is learning how to let go regarding her children. Patient reports she is considering attending a grief support group as she is still experiencing  grief/loss issues related to loss of parents.     Suicidal/Homicidal: Nowithout intent/plan  Therapist Response:  reviewed symptoms, facilitated expression of thoughts and feelings, validated feelings, discussed and practiced mindfulness using breath awareness as a tool to cope with ruminating thoughts at night, discussed ways to maintain/set limits with children, encouraged patient to follow through with plan to attend grief support group   Plan: Return again in 2 weeks, patient agrees to implement strategies discussed in session   Diagnosis: Axis I: major depressive disorder, recurrent, moderate    Axis II: Deferred    BYNUM,PEGGY, LCSW 08/25/2017

## 2017-08-26 ENCOUNTER — Telehealth: Payer: Self-pay

## 2017-08-26 ENCOUNTER — Ambulatory Visit (INDEPENDENT_AMBULATORY_CARE_PROVIDER_SITE_OTHER): Payer: Medicare HMO | Admitting: Family Medicine

## 2017-08-26 ENCOUNTER — Encounter: Payer: Self-pay | Admitting: Family Medicine

## 2017-08-26 VITALS — BP 120/80 | HR 72 | Resp 16 | Ht 61.0 in | Wt 353.0 lb

## 2017-08-26 DIAGNOSIS — F322 Major depressive disorder, single episode, severe without psychotic features: Secondary | ICD-10-CM

## 2017-08-26 DIAGNOSIS — Z1231 Encounter for screening mammogram for malignant neoplasm of breast: Secondary | ICD-10-CM

## 2017-08-26 DIAGNOSIS — E559 Vitamin D deficiency, unspecified: Secondary | ICD-10-CM

## 2017-08-26 DIAGNOSIS — R7303 Prediabetes: Secondary | ICD-10-CM | POA: Diagnosis not present

## 2017-08-26 DIAGNOSIS — I10 Essential (primary) hypertension: Secondary | ICD-10-CM | POA: Diagnosis not present

## 2017-08-26 DIAGNOSIS — Z09 Encounter for follow-up examination after completed treatment for conditions other than malignant neoplasm: Secondary | ICD-10-CM | POA: Diagnosis not present

## 2017-08-26 NOTE — Patient Instructions (Signed)
Wellness with nurse past due please schedule  Weight loss goal 10 pounds  Keep  October MD follow up  Please schedule mammogram at checkout  Labs today Vit D and hBA1C  Please work on good  health habits so that your health will improve. 1. Commitment to daily physical activity for 30 to 60  minutes, if you are able to do this.  2. Commitment to wise food choices. Aim for half of your  food intake to be vegetable and fruit, one quarter starchy foods, and one quarter protein. Try to eat on a regular schedule  3 meals per day, snacking between meals should be limited to vegetables or fruits or small portions of nuts. 64 ounces of water per day is generally recommended, unless you have specific health conditions, like heart failure or kidney failure where you will need to limit fluid intake.  3. Commitment to sufficient and a  good quality of physical and mental rest daily, generally between 6 to 8 hours per day.  WITH PERSISTANCE AND PERSEVERANCE, THE IMPOSSIBLE , BECOMES THE NORM! It is important that you exercise regularly at least 30 minutes 5 times a week. If you develop chest pain, have severe difficulty breathing, or feel very tired, stop exercising immediately and seek medical attention

## 2017-08-26 NOTE — BH Specialist Note (Signed)
[facility] Telephone Follow-up  MRN: [id number] NAME: [name] Date: 09-19-17   Total time: 15 minutes Call number: 2/6  Reason for call today: Reason for Contact: PHQ9-2 weeks  PHQ-9 Scores:  Depression screen [facility] 2/9 09-19-2017 Sep 19, 2017 08/14/2017 08/14/2017 08/14/2017  Decreased Interest 1 2 2 3  0  Down, Depressed, Hopeless 2 2 3 3 1   PHQ - 2 Score 3 4 5 6 1   Altered sleeping 1 3 3 3  -  Tired, decreased energy 2 2 2 3  -  Change in appetite 0 2 2 3  -  Feeling bad or failure about yourself  1 1 3 3  -  Trouble concentrating 1 1 2 3  -  Moving slowly or fidgety/restless 0 1 2 3  -  Suicidal thoughts 0 0 1 3 -  PHQ-9 Score 8 14 20 27  -  Difficult doing work/chores Somewhat difficult Not difficult at all Extremely dIfficult - -  Some recent data might be hidden   GAD-7 Scores:  GAD 7 : Generalized Anxiety Score 19-Sep-2017 08/14/2017  Nervous, Anxious, on Edge 1 2  Control/stop worrying 1 3  Worry too much - different things 2 3  Trouble relaxing 1 2  Restless 0 1  Easily annoyed or irritable 1 1  Afraid - awful might happen 1 2  Total GAD 7 Score 7 14  Anxiety Difficulty Somewhat difficult Very difficult    Stress Current stressors: Current Stressors: [facility] Sleep: Sleep: No problems Appetite: Appetite: No problems Coping ability: Coping ability: Normal Patient taking medications as prescribed: Patient taking medications as prescribed: Yes  Current medications:  Outpatient Encounter Medications as of 09/19/17  Medication Sig  . DULoxetine (CYMBALTA) 30 MG capsule Take 1 capsule (30 mg total) by mouth daily. For mood stability  . gabapentin (NEURONTIN) 100 MG capsule Take 1 capsule (100 mg total) by mouth 3 (three) times daily. For anxiety  . hydrochlorothiazide (HYDRODIURIL) 25 MG tablet Take 1 tablet (25 mg total) by mouth daily.  . hydrOXYzine (ATARAX/VISTARIL) 50 MG tablet Take 1 tablet (50 mg total) by  mouth every 6 (six) hours as needed for anxiety.  ibuprofen (ADVIL,MOTRIN) 800 MG tablet One tablet twice daily as needed, for uncontrolled knee pain  . temazepam (RESTORIL) 15 MG capsule Take 1 capsule (15 mg total) by mouth at bedtime as needed for sleep.  . [DISCONTINUED] ALPRAZolam (XANAX) 0.25 MG tablet Take 1 tablet (0.25 mg total) by mouth at bedtime as needed for anxiety.   No facility-administered encounter medications on file as of 09-19-2017.      Self-harm Behaviors Risk Assessment Self-harm risk factors: Self-harm risk factors: (NA) Patient endorses recent thoughts of harming self: Have you recently had any thoughts about harming yourself?: No  Suicide Severity Rating Scale: No flowsheet data found. C-SRSS 08/14/2017 08/14/2017 08/14/2017 08/14/2017 08/15/2017 September 19, 2017 19-Sep-2017  1. Wish to be Dead Yes No No No No No No  2. Suicidal Thoughts Yes No No No No No No  3. Suicidal Thoughts with Method Without Specific Plan or Intent to Act Yes No No - No No No  4. Suicidal Intent Without Specific Plan Yes No No - - No No  5. Suicide Intent with Specific Plan No No No - - No No  6. Suicide Behavior Question No No No No No - No     Danger to Others Risk Assessment Danger to others risk factors: Danger to Others Risk Factors: No risk factors noted  Patient endorses recent thoughts of harming others: Notification required: No need or identified person  Dynamic Appraisal of Situational Aggression (DASA):  CHL DYNAMIC APPRAISAL OF SITUATIONAL AGGRESSION (DASA) 08/14/2017 08/14/2017 08/15/2017 08/16/2017  Irritability 0 0 0 0  Impulsivity 0 0 0 0  Unwillingness to Follow Directions 0 0 0 0  Sensitivity to Perceived Provocation 0 0 0 0  Easily Angered When Requests are Denied 0 0 0 0  Negative Attitudes 0 0 0 0  Verbal Threats 0 0 0 0  Total DASA Score 0 0 0 0  Final Risk Rating Low Risk Low Risk Low Risk Low Risk  Physical Aggression against OBJECTS No No No No  Verbal  Aggression against OTHER PEOPLE No No No No  Physical Aggression against OTHER PEOPLE No No No No     Substance Use Assessment Patient recently consumed alcohol:  No  Alcohol Use Disorder Identification Test (AUDIT):  Alcohol Use Disorder Test (AUDIT) 08/14/2017 08/17/2017  Patient refused Alcohol Screening Tool Yes -  1. How often do you have a drink containing alcohol? - 0  2. How many drinks containing alcohol do you have on a typical day when you are drinking? - 0  3. How often do you have six or more drinks on one occasion? - 0  AUDIT-C Score - 0  4. How often during the last year have you found that you were not able to stop drinking once you had started? - 0  5. How often during the last year have you failed to do what was normally expected from you becasue of drinking? - 0  6. How often during the last year have you needed a first drink in the morning to get yourself going after a heavy drinking session? - 0  7. How often during the last year have you had a feeling of guilt of remorse after drinking? - 0  8. How often during the last year have you been unable to remember what happened the night before because you had been drinking? - 0  9. Have you or someone else been injured as a result of your drinking? - 0  10. Has a relative or friend or a doctor or another health worker been concerned about your drinking or suggested you cut down? - 0  Alcohol Use Disorder Identification Test Final Score (AUDIT) - 0   Patient recently used drugs:  No   Goals, Interventions and Follow-up Plan Goals: Increase healthy adjustment to current life circumstances, Increase adequate support systems for patient/family and Increase motivation to adhere to plan of care Interventions: Motivational Interviewing, Solution-Focused Strategies, Mindfulness or Management consultant, Behavioral Activation, Brief CBT and Supportive Counseling Follow-up Plan: Continue outpatient counseling wth Peggyb Bynum  Upcoming  apptn with Dr. Lolita Rieger on 08-29-2017  Summary:   [name] was recently released from [facility] inpatient psychiatric hospitalization.  Patient denies SI//HI/Psychosis/SA.   Patient reports that she has been compliant with taking the psych medication since discharge. Patient reports that the medication is working for her.  Patient reports that she and her daughters are doing much bette with communicating with each other  Patient reports that the bank has allowed her to reduce her payments and place her missed payments on  the back end of the loan therefore, taking away a lot of stress and anxiety. Patient reports that she has set limits with daughters regarding making mortgage payments.        Phillip Heal LaVerne, LCAS-A
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
## 2017-08-27 ENCOUNTER — Encounter: Payer: Self-pay | Admitting: Family Medicine

## 2017-08-27 LAB — VITAMIN D 25 HYDROXY (VIT D DEFICIENCY, FRACTURES): VIT D 25 HYDROXY: 29 ng/mL — AB (ref 30–100)

## 2017-08-27 LAB — HEMOGLOBIN A1C
EAG (MMOL/L): 7.1 (calc)
HEMOGLOBIN A1C: 6.1 %{Hb} — AB (ref <5.7)
MEAN PLASMA GLUCOSE: 128 (calc)

## 2017-08-29 ENCOUNTER — Ambulatory Visit (INDEPENDENT_AMBULATORY_CARE_PROVIDER_SITE_OTHER): Payer: Medicare HMO | Admitting: Psychiatry

## 2017-08-29 ENCOUNTER — Ambulatory Visit (HOSPITAL_COMMUNITY): Payer: Self-pay | Admitting: Psychiatry

## 2017-08-29 VITALS — BP 147/84 | HR 68 | Ht 61.0 in | Wt 350.0 lb

## 2017-08-29 DIAGNOSIS — I10 Essential (primary) hypertension: Secondary | ICD-10-CM | POA: Diagnosis not present

## 2017-08-29 DIAGNOSIS — F515 Nightmare disorder: Secondary | ICD-10-CM

## 2017-08-29 DIAGNOSIS — R450 Nervousness: Secondary | ICD-10-CM

## 2017-08-29 DIAGNOSIS — F419 Anxiety disorder, unspecified: Secondary | ICD-10-CM

## 2017-08-29 DIAGNOSIS — Z91419 Personal history of unspecified adult abuse: Secondary | ICD-10-CM | POA: Diagnosis not present

## 2017-08-29 DIAGNOSIS — R69 Illness, unspecified: Secondary | ICD-10-CM | POA: Diagnosis not present

## 2017-08-29 DIAGNOSIS — E669 Obesity, unspecified: Secondary | ICD-10-CM | POA: Diagnosis not present

## 2017-08-29 DIAGNOSIS — F331 Major depressive disorder, recurrent, moderate: Secondary | ICD-10-CM | POA: Diagnosis not present

## 2017-08-29 DIAGNOSIS — Z96653 Presence of artificial knee joint, bilateral: Secondary | ICD-10-CM | POA: Diagnosis not present

## 2017-08-29 DIAGNOSIS — G4700 Insomnia, unspecified: Secondary | ICD-10-CM | POA: Diagnosis not present

## 2017-08-29 DIAGNOSIS — Z6841 Body Mass Index (BMI) 40.0 and over, adult: Secondary | ICD-10-CM

## 2017-08-29 DIAGNOSIS — M25519 Pain in unspecified shoulder: Secondary | ICD-10-CM | POA: Diagnosis not present

## 2017-08-29 DIAGNOSIS — Z818 Family history of other mental and behavioral disorders: Secondary | ICD-10-CM

## 2017-08-29 MED ORDER — TEMAZEPAM 30 MG PO CAPS: 30.0000 mg | ORAL_CAPSULE | Freq: Every evening | ORAL | 0 refills | Status: DC | PRN

## 2017-08-29 MED ORDER — DULOXETINE HCL 60 MG PO CPEP: 60.0000 mg | ORAL_CAPSULE | Freq: Every day | ORAL | 0 refills | Status: DC

## 2017-08-29 NOTE — Patient Instructions (Signed)
1. Increase duloxetine 60 mg daily  2. Continue gabapentin 100 mg three times a day  3. Continue Temazepam 30 mg at night  4. Continue hydroxyzine 50 mg daily prn for anxiety  5. Return to clinic in one month for 30 mins

## 2017-09-09 ENCOUNTER — Telehealth: Payer: Self-pay

## 2017-09-09 NOTE — Telephone Encounter (Signed)
Patient is benig seen by Dr. Vanetta Shawl and she is now on the inactive list. Information routed to the PCP and Dr. Vanetta Shawl.

## 2017-09-11 ENCOUNTER — Ambulatory Visit (HOSPITAL_COMMUNITY): Payer: Medicare HMO | Admitting: Psychiatry

## 2017-09-17 ENCOUNTER — Telehealth: Payer: Self-pay | Admitting: Family Medicine

## 2017-09-17 NOTE — Telephone Encounter (Signed)
Spoke with daughter and advised her that patient needed to go to urgent care to be evaluated with verbal understanding.

## 2017-09-17 NOTE — Telephone Encounter (Signed)
Patients daughter called in requesting an appointmentt for patient. She fell 2 weeks ago in the basement and caught herself with her hand. She felt a pop this morning and has been in excruciating pain crying.  Cb#: [phone]

## 2017-09-17 NOTE — Telephone Encounter (Signed)
Needs to go to urgent care for evakuation there is one in [place]

## 2017-09-17 NOTE — Telephone Encounter (Signed)
Left message on daughters call back number requesting call back. Left message on patients phone listed in chart requesting call back.

## 2017-09-25 NOTE — Progress Notes (Deleted)
BH MD/PA/NP OP Progress Note  09/25/2017 11:01 AM [name]  MRN:  [id number]  Chief Complaint:  HPI: *** Visit Diagnosis: No diagnosis found.  Past Psychiatric History: Please see initial evaluation for full details. I have reviewed the history. No updates at this time.     Past Medical History:  Past Medical History:  Diagnosis Date  . Chronic back pain   . Degenerative joint disease    Left TKA in 07/2009-Dr. Landau  . Depression 2001   doesn't take any meds  . Headache(784.0)    occasionally  . Hypothyroidism    was on Levothyroxine-has been off x 4 months  . Joint pain   . Joint swelling   . Morbid obesity (HCC)   . Nocturia   . Osteoarthritis of right knee 07/13/2013  . Peripheral edema    to right leg;takes Furosemide occasionally and hasnt had one in a month  . Syncope    with palpitations; evaluated by [facility] in 2008 with normal echo    Past Surgical History:  Procedure Laterality Date  . ABDOMINAL HYSTERECTOMY  1995    no oophorectomy  . CARPAL TUNNEL RELEASE  1991   Bilateral  . CESAREAN SECTION     X2  . CHOLECYSTECTOMY    . CHOLECYSTECTOMY, LAPAROSCOPIC  2007   Dr. Lovell Sheehan  . COLONOSCOPY N/A 03/03/2013   Procedure: COLONOSCOPY;  Surgeon: Malissa Hippo, MD;  [facility];  Service: Endoscopy;  Laterality: N/A;  930  . CRANIECTOMY SUBOCCIPITAL W/ CERVICAL LAMINECTOMY / CHIARI  2007   Repair of Arnold-Chiari malformation  . JOINT REPLACEMENT Left 2011  . KNEE ARTHROSCOPY  1995   Dr. Cleophas Dunker   . THYROIDECTOMY, PARTIAL  2004   Left lobectomy - large benign nodule  . TONSILLECTOMY    . TOTAL KNEE ARTHROPLASTY  07/2009   Left knee , Dr. Dion Saucier   . TOTAL KNEE ARTHROPLASTY Right 07/13/2013   Procedure: TOTAL KNEE ARTHROPLASTY;  Surgeon: Eulas Post, MD;  Location: [facility] OR;  Service: Orthopedics;  Laterality: Right;    Family Psychiatric History: Please see initial evaluation for full details. I have  reviewed the history. No updates at this time.     Family History:  Family History  Problem Relation Age of Onset  . Diabetes Mother   . Depression Mother   . Diabetes Father        emphsema   . Hyperlipidemia Father   . Depression Other   . Colon cancer Neg Hx     Social History:  Social History   Socioeconomic History  . Marital status: Divorced    Spouse name: Not on file  . Number of children: 3  . Years of education: Not on file  . Highest education level: Not on file  Occupational History  . Occupation: Employed seeking disabilty 2012  Social Needs  . Financial resource strain: Not on file  . Food insecurity:    Worry: Not on file    Inability: Not on file  . Transportation needs:    Medical: Not on file    Non-medical: Not on file  Tobacco Use  . Smoking status: Never Smoker  . Smokeless tobacco: Never Used  Substance and Sexual Activity  . Alcohol use: Yes    Alcohol/week: 0.0 oz    Comment: once a month -glass or shot   . Drug use: No  . Sexual activity: Not Currently    Birth control/protection: Surgical  Lifestyle  . Physical activity:    Days per week: Not on file    Minutes per session: Not on file  . Stress: Not on file  Relationships  . Social connections:    Talks on phone: Not on file    Gets together: Not on file    Attends religious service: Not on file    Active member of club or organization: Not on file    Attends meetings of clubs or organizations: Not on file    Relationship status: Not on file  Other Topics Concern  . Not on file  Social History Narrative  . Not on file    Allergies:  Allergies  Allergen Reactions  . Aleve [Naproxen Sodium] Shortness Of Breath  . Penicillins Swelling    .Has patient had a PCN reaction causing immediate rash, facial/tongue/throat swelling, SOB or lightheadedness with hypotension: Yes Has patient had a PCN reaction causing severe rash involving mucus membranes or skin necrosis: No Has  patient had a PCN reaction that required hospitalization: No Has patient had a PCN reaction occurring within the last 10 years: No If all of the above answers are "NO", then may proceed with Cephalosporin use.   . Adhesive [Tape]     rash  . Phentermine   . Latex Rash    Metabolic Disorder Labs: Lab Results  Component Value Date   HGBA1C 6.1 (H) 08/26/2017   MPG 128 08/26/2017   MPG 126 04/11/2017   No results found for: PROLACTIN Lab Results  Component Value Date   CHOL 124 04/11/2017   TRIG 52 04/11/2017   HDL 51 04/11/2017   CHOLHDL 2.4 04/11/2017   VLDL 12 10/02/2016   LDLCALC 60 04/11/2017   LDLCALC 92 10/02/2016   Lab Results  Component Value Date   TSH 2.34 04/11/2017   TSH 5.24 (H) 10/02/2016    Therapeutic Level Labs: No results found for: LITHIUM No results found for: VALPROATE No components found for:  CBMZ  Current Medications: Current Outpatient Medications  Medication Sig Dispense Refill  . DULoxetine (CYMBALTA) 60 MG capsule Take 1 capsule (60 mg total) by mouth daily. 30 capsule 0  . gabapentin (NEURONTIN) 100 MG capsule Take 1 capsule (100 mg total) by mouth 3 (three) times daily. For anxiety 90 capsule 0  . hydrochlorothiazide (HYDRODIURIL) 25 MG tablet Take 1 tablet (25 mg total) by mouth daily. 90 tablet 3  . hydrOXYzine (ATARAX/VISTARIL) 50 MG tablet Take 1 tablet (50 mg total) by mouth every 6 (six) hours as needed for anxiety. 30 tablet 0  . ibuprofen (ADVIL,MOTRIN) 800 MG tablet One tablet twice daily as needed, for uncontrolled knee pain 20 tablet 0  . temazepam (RESTORIL) 30 MG capsule Take 1 capsule (30 mg total) by mouth at bedtime as needed for sleep. 30 capsule 0   No current facility-administered medications for this visit.      Musculoskeletal: Strength & Muscle Tone: within normal limits Gait & Station: normal Patient leans: N/A  Psychiatric Specialty Exam: ROS  There were no vitals taken for this visit.There is no height or  weight on file to calculate BMI.  General Appearance: Fairly Groomed  Eye Contact:  Good  Speech:  Clear and Coherent  Volume:  Normal  Mood:  {BHH MOOD:22306}  Affect:  {Affect (PAA):22687}  Thought Process:  Coherent  Orientation:  Full (Time, Place, and Person)  Thought Content: Logical   Suicidal Thoughts:  {ST/HT (PAA):22692}  Homicidal Thoughts:  {ST/HT (PAA):22692}  Memory:  Immediate;   Good  Judgement:  {Judgement (PAA):22694}  Insight:  {Insight (PAA):22695}  Psychomotor Activity:  Normal  Concentration:  Concentration: Good and Attention Span: Good  Recall:  Good  Fund of Knowledge: Good  Language: Good  Akathisia:  No  Handed:  Right  AIMS (if indicated): not done  Assets:  Communication Skills Desire for Improvement  ADL's:  Intact  Cognition: WNL  Sleep:  {BHH GOOD/FAIR/POOR:22877}   Screenings: AIMS     [facility]  AIMS Total Score  0    AUDIT     [facility]  Alcohol Use Disorder Identification Test Final Score (AUDIT)  0    GAD-7     Virtual BH Phone Follow Up from 08/26/2017 in [facility] Virtual BH Visit from 08/14/2017 in [facility]  Total GAD-7 Score  7  14    PHQ2-9     Virtual BH Phone Follow Up from 08/26/2017 in [facility] Most recent reading at 08/26/2017  8:23 PM Office Visit from 08/26/2017 in [facility] Most recent reading at 08/26/2017 10:20 AM Virtual BH Visit from 08/14/2017 in [facility] Most recent reading at 08/14/2017  4:04 PM Office Visit from 08/14/2017 in [facility] Most recent reading at 08/14/2017  9:16 AM Counselor from 04/15/2017 in [facility] Most recent reading at 04/15/2017 10:10 AM  PHQ-2 Total Score  3  4  5  6  5   PHQ-9 Total Score  8  14  20  27  10         Assessment and Plan:  [name] is a 57 y.o. year old female with a history of depression, hypertension, obesity , who presents for follow up appointment for No diagnosis found. Patient was admitted in 07/2017 to [facility] for worsening depression with SI with no plan/intent in the context of loss of her family.   # MDD, moderate, recurrent without psychotic features Patient reports overall improvement in neurovegetative symptoms after discharge.  Will do further up titration of duloxetine to target residual neurovegetative symptoms.  Discussed risk, which includes but not limited to hypertension.  Will continue gabapentin for anxiety/shoulder pain.  Will continue hydroxyzine prn for anxiety. Validate her grief of loss of her parents. She will see Ms. Bynum, and the patient is also planning to see a grief therapist.   # Insomnia She just uptitrated Temazepam for middle insomnia, recommended by PCP. Will continue current dose. Discussed sleep hygiene. Noted that the patient did sleep study a few years ago; negative for sleep apnea per patient report.   Plan 1. Increase duloxetine 60 mg daily  2. Continue gabapentin 100 mg three times a day  3. Continue Temazepam 30 mg at night  4. Continue hydroxyzine 50 mg daily prn for anxiety  5. Return to clinic in one month for 30 mins  The patient demonstrates the following risk factors for suicide: Chronic risk factors for suicide include: psychiatric disorder of depression and completed suicide in a family member. Acute risk factors for suicide include: loss (financial, interpersonal, professional) and recent discharge from inpatient psychiatry. Protective factors for this patient include: positive social support, responsibility to others (children, family), coping skills and hope for the future. Considering these factors, the overall suicide risk at this point appears to be low. Patient is appropriate for outpatient follow up.  Neysa Hotter, MD 09/25/2017, 11:01 AM

## 2017-09-29 ENCOUNTER — Ambulatory Visit (HOSPITAL_COMMUNITY)
Admission: RE | Admit: 2017-09-29 | Discharge: 2017-09-29 | Disposition: A | Discharge status: Home / Self Care | Payer: Medicare HMO | Source: Ambulatory Visit | Attending: Family Medicine | Admitting: Family Medicine

## 2017-09-29 ENCOUNTER — Encounter (HOSPITAL_COMMUNITY): Payer: Self-pay

## 2017-09-29 ENCOUNTER — Other Ambulatory Visit: Payer: Self-pay | Admitting: Family Medicine

## 2017-09-29 DIAGNOSIS — Z1231 Encounter for screening mammogram for malignant neoplasm of breast: Secondary | ICD-10-CM | POA: Diagnosis not present

## 2017-09-29 DIAGNOSIS — R928 Other abnormal and inconclusive findings on diagnostic imaging of breast: Secondary | ICD-10-CM

## 2017-09-30 ENCOUNTER — Ambulatory Visit (HOSPITAL_COMMUNITY): Payer: Medicare HMO | Admitting: Psychiatry

## 2017-09-30 ENCOUNTER — Ambulatory Visit (HOSPITAL_COMMUNITY)
Admission: RE | Admit: 2017-09-30 | Discharge: 2017-09-30 | Disposition: A | Discharge status: Home / Self Care | Payer: Medicare HMO | Source: Ambulatory Visit | Attending: Family Medicine | Admitting: Family Medicine

## 2017-09-30 DIAGNOSIS — R928 Other abnormal and inconclusive findings on diagnostic imaging of breast: Secondary | ICD-10-CM | POA: Diagnosis not present

## 2017-09-30 DIAGNOSIS — N6002 Solitary cyst of left breast: Secondary | ICD-10-CM | POA: Diagnosis not present

## 2017-10-01 ENCOUNTER — Ambulatory Visit (HOSPITAL_COMMUNITY): Payer: Self-pay | Admitting: Psychiatry

## 2017-10-06 ENCOUNTER — Ambulatory Visit: Payer: Self-pay

## 2017-10-06 ENCOUNTER — Other Ambulatory Visit: Payer: Self-pay | Admitting: Family Medicine

## 2017-10-07 ENCOUNTER — Telehealth (HOSPITAL_COMMUNITY): Payer: Self-pay | Admitting: Psychiatry

## 2017-10-07 MED ORDER — DULOXETINE HCL 60 MG PO CPEP: 60.0000 mg | ORAL_CAPSULE | Freq: Every day | ORAL | 0 refills | Status: DC

## 2017-10-07 NOTE — Telephone Encounter (Signed)
Done --- patient informed of med refill & reminded of no show policy & transferred to front office Lupita Leash)  to make a f/u appointment

## 2017-10-07 NOTE — Telephone Encounter (Signed)
Ordered duloxetine refill per pharmacy request. Please contact the patient to make follow up appointment. Also reminds her of no show policy.

## 2017-10-08 ENCOUNTER — Encounter (HOSPITAL_COMMUNITY): Payer: Self-pay | Admitting: Psychiatry

## 2017-10-08 ENCOUNTER — Ambulatory Visit (INDEPENDENT_AMBULATORY_CARE_PROVIDER_SITE_OTHER): Payer: Medicare HMO | Admitting: Psychiatry

## 2017-10-08 VITALS — BP 150/92 | HR 62 | Ht 61.0 in | Wt 357.0 lb

## 2017-10-08 DIAGNOSIS — Z818 Family history of other mental and behavioral disorders: Secondary | ICD-10-CM | POA: Diagnosis not present

## 2017-10-08 DIAGNOSIS — G4700 Insomnia, unspecified: Secondary | ICD-10-CM

## 2017-10-08 DIAGNOSIS — F331 Major depressive disorder, recurrent, moderate: Secondary | ICD-10-CM | POA: Diagnosis not present

## 2017-10-08 DIAGNOSIS — R69 Illness, unspecified: Secondary | ICD-10-CM | POA: Diagnosis not present

## 2017-10-08 MED ORDER — GABAPENTIN 100 MG PO CAPS: 100.0000 mg | ORAL_CAPSULE | Freq: Three times a day (TID) | ORAL | 0 refills | Status: DC

## 2017-10-08 MED ORDER — DULOXETINE HCL 60 MG PO CPEP: 60.0000 mg | ORAL_CAPSULE | Freq: Every day | ORAL | 0 refills | Status: DC

## 2017-10-08 NOTE — Patient Instructions (Addendum)
1. Continue duloxetine 60 mg daily  2. Continue gabapentin 100 mg three times a day  3. Start melatonin 3-6 mg at night as needed (2 hours before going to bed) 4. Continue Temazepam 30 mg at night as needed for sleep  5. Continue hydroxyzine 50 mg daily prn for anxiety  6. Return to clinic in three months for 15 mins

## 2017-10-08 NOTE — Progress Notes (Signed)
BH MD/PA/NP OP Progress Note  10/08/2017 8:26 AM [name]  MRN:  [id number]  Chief Complaint:  Chief Complaint    Depression; Follow-up     HPI:  Patient presents for follow-up appointment for depression.  She states that she believes she has been doing better.  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 She missed to go to work yesterday. She wanted to be by herself. She talks about her children, who visits her place. Although she occasionally feels frustrated by the way they do, she believes that she has been handling those better. Although she used to see a video of her mother's funeral everyday, she does not do it anymore. She feels that her grief has been less intense, although she still misses her parents. She wants to go traveling (and smiles); hopes to have time for herself to take care of herself.  She endorses middle insomnia; she takes temazepam up to 60 mg daily. She understands the importance of keeping adherence. She feels fatigue. She enjoys going to pools. She feels depressed at times. She denies SI. She denies anxiety or panic attacks. She has not taken hydroxyzine since the last week.    Wt Readings from Last 3 Encounters:  10/08/17 (!) 357 lb (161.9 kg)  08/29/17 (!) 350 lb (158.8 kg)  08/26/17 (!) 353 lb (160.1 kg)   Per PMP,  Temazepam filled on 09/19/2017    Visit Diagnosis:    ICD-10-CM   1. Major depressive disorder, recurrent episode, moderate (HCC) F33.1     Past Psychiatric History: Please see initial evaluation for full details. I have reviewed the history. No updates at this time.     Past Medical History:  Past Medical History:  Diagnosis Date  . Chronic back pain   . Degenerative joint disease    Left TKA in 07/2009-Dr. Landau  . Depression 2001   doesn't take any meds  . Headache(784.0)    occasionally  . Hypothyroidism    was on Levothyroxine-has been off x 4 months  . Joint pain   . Joint swelling   . Morbid obesity  (HCC)   . Nocturia   . Osteoarthritis of right knee 07/13/2013  . Peripheral edema    to right leg;takes Furosemide occasionally and hasnt had one in a month  . Syncope    with palpitations; evaluated by [facility] in 2008 with normal echo    Past Surgical History:  Procedure Laterality Date  . ABDOMINAL HYSTERECTOMY  1995    no oophorectomy  . CARPAL TUNNEL RELEASE  1991   Bilateral  . CESAREAN SECTION     X2  . CHOLECYSTECTOMY    . CHOLECYSTECTOMY, LAPAROSCOPIC  2007   Dr. Lovell Sheehan  . COLONOSCOPY N/A 03/03/2013   Procedure: COLONOSCOPY;  Surgeon: Malissa Hippo, MD;  Location: [facility]ITE;  Service: Endoscopy;  Laterality: N/A;  930  . CRANIECTOMY SUBOCCIPITAL W/ CERVICAL LAMINECTOMY / CHIARI  2007   Repair of Arnold-Chiari malformation  . JOINT REPLACEMENT Left 2011  . KNEE ARTHROSCOPY  1995   Dr. Cleophas Dunker   . THYROIDECTOMY, PARTIAL  2004   Left lobectomy - large benign nodule  . TONSILLECTOMY    . TOTAL KNEE ARTHROPLASTY  07/2009   Left knee , Dr. Dion Saucier   . TOTAL KNEE ARTHROPLASTY Right 07/13/2013   Procedure: TOTAL KNEE ARTHROPLASTY;  Surgeon: Eulas Post, MD;  Location: [facility] OR;  Service: Orthopedics;  Laterality: Right;  Family Psychiatric History: Please see initial evaluation for full details. I have reviewed the history. No updates at this time.     Family History:  Family History  Problem Relation Age of Onset  . Diabetes Mother   . Depression Mother   . Diabetes Father        emphsema   . Hyperlipidemia Father   . Depression Other   . Colon cancer Neg Hx     Social History:  Social History   Socioeconomic History  . Marital status: Divorced    Spouse name: Not on file  . Number of children: 3  . Years of education: Not on file  . Highest education level: Not on file  Occupational History  . Occupation: Employed seeking disabilty 2012  Social Needs  . Financial resource strain: Not on file  . Food insecurity:     Worry: Not on file    Inability: Not on file  . Transportation needs:    Medical: Not on file    Non-medical: Not on file  Tobacco Use  . Smoking status: Never Smoker  . Smokeless tobacco: Never Used  Substance and Sexual Activity  . Alcohol use: Yes    Alcohol/week: 0.0 oz    Comment: once a month -glass or shot   . Drug use: No  . Sexual activity: Not Currently    Birth control/protection: Surgical  Lifestyle  . Physical activity:    Days per week: Not on file    Minutes per session: Not on file  . Stress: Not on file  Relationships  . Social connections:    Talks on phone: Not on file    Gets together: Not on file    Attends religious service: Not on file    Active member of club or organization: Not on file    Attends meetings of clubs or organizations: Not on file    Relationship status: Not on file  Other Topics Concern  . Not on file  Social History Narrative  . Not on file    Allergies:  Allergies  Allergen Reactions  . Aleve [Naproxen Sodium] Shortness Of Breath  . Penicillins Swelling    .Has patient had a PCN reaction causing immediate rash, facial/tongue/throat swelling, SOB or lightheadedness with hypotension: Yes Has patient had a PCN reaction causing severe rash involving mucus membranes or skin necrosis: No Has patient had a PCN reaction that required hospitalization: No Has patient had a PCN reaction occurring within the last 10 years: No If all of the above answers are "NO", then may proceed with Cephalosporin use.   . Adhesive [Tape]     rash  . Phentermine   . Latex Rash    Metabolic Disorder Labs: Lab Results  Component Value Date   HGBA1C 6.1 (H) 08/26/2017   MPG 128 08/26/2017   MPG 126 04/11/2017   No results found for: PROLACTIN Lab Results  Component Value Date   CHOL 124 04/11/2017   TRIG 52 04/11/2017   HDL 51 04/11/2017   CHOLHDL 2.4 04/11/2017   VLDL 12 10/02/2016   LDLCALC 60 04/11/2017   LDLCALC 92 10/02/2016   Lab  Results  Component Value Date   TSH 2.34 04/11/2017   TSH 5.24 (H) 10/02/2016    Therapeutic Level Labs: No results found for: LITHIUM No results found for: VALPROATE No components found for:  CBMZ  Current Medications: Current Outpatient Medications  Medication Sig Dispense Refill  . DULoxetine (CYMBALTA) 60 MG capsule Take  1 capsule (60 mg total) by mouth daily. 90 capsule 0  . gabapentin (NEURONTIN) 100 MG capsule Take 1 capsule (100 mg total) by mouth 3 (three) times daily. For anxiety 270 capsule 0  . hydrochlorothiazide (HYDRODIURIL) 25 MG tablet Take 1 tablet (25 mg total) by mouth daily. 90 tablet 3  . hydrOXYzine (ATARAX/VISTARIL) 50 MG tablet Take 1 tablet (50 mg total) by mouth every 6 (six) hours as needed for anxiety. 30 tablet 0  . ibuprofen (ADVIL,MOTRIN) 800 MG tablet TAKE 1 TABLET BY MOUTH TWICE DAILY AS NEEDED FOR UNCONTROLLED KNEE PAIN 20 tablet 0  . temazepam (RESTORIL) 30 MG capsule Take 1 capsule (30 mg total) by mouth at bedtime as needed for sleep. 30 capsule 0   No current facility-administered medications for this visit.      Musculoskeletal: Strength & Muscle Tone: within normal limits Gait & Station: normal Patient leans: N/A  Psychiatric Specialty Exam: Review of Systems  Musculoskeletal: Positive for joint pain.  Psychiatric/Behavioral: Positive for depression. Negative for hallucinations, memory loss, substance abuse and suicidal ideas. The patient has insomnia. The patient is not nervous/anxious.   All other systems reviewed and are negative.   Blood pressure (!) 150/92, pulse 62, height 5\' 1"  (1.549 m), weight (!) 357 lb (161.9 kg), SpO2 95 %.Body mass index is 67.45 kg/m.  General Appearance: Fairly Groomed  Eye Contact:  Fair  Speech:  Clear and Coherent  Volume:  Normal  Mood:  "better"  Affect:  Appropriate, Congruent and slightly restricted  Thought Process:  Coherent  Orientation:  Full (Time, Place, and Person)  Thought Content:  Logical   Suicidal Thoughts:  No  Homicidal Thoughts:  No  Memory:  Immediate;   Good  Judgement:  Good  Insight:  Fair  Psychomotor Activity:  Normal  Concentration:  Concentration: Good and Attention Span: Good  Recall:  Good  Fund of Knowledge: Good  Language: Good  Akathisia:  No  Handed:  Right  AIMS (if indicated): not done  Assets:  Communication Skills Desire for Improvement  ADL's:  Intact  Cognition: WNL  Sleep:  Poor   Screenings: AIMS     [facility]  AIMS Total Score  0    AUDIT     [facility]  Alcohol Use Disorder Identification Test Final Score (AUDIT)  0    GAD-7     Virtual BH Phone Follow Up from 08/26/2017 in [facility] Virtual BH Visit from 08/14/2017 in [facility]  Total GAD-7 Score  7  14    PHQ2-9     Virtual BH Phone Follow Up from 08/26/2017 in [facility] Most recent reading at 08/26/2017  8:23 PM Office Visit from 08/26/2017 in [facility] Most recent reading at 08/26/2017 10:20 AM Virtual BH Visit from 08/14/2017 in [facility] Most recent reading at 08/14/2017  4:04 PM Office Visit from 08/14/2017 in [facility] Most recent reading at 08/14/2017  9:16 AM Counselor from 04/15/2017 in [facility] Most recent reading at 04/15/2017 10:10 AM  PHQ-2 Total Score  3  4  5  6  5   PHQ-9 Total Score  8  14  20  27  10        Assessment and Plan:  Arianne SADI ARAVE is a 58 y.o. year old female with a history of depression, hypertension,  obesity, who presents for follow up appointment for Major depressive disorder, recurrent episode, moderate (HCC) . Patient was admitted to [facility] for worsening depression with SI with no plan/intent in the context of loss of her family.   # MDD, moderate, recurrent  without psychotic features The patient reports overall improvement in neurovegetative symptoms after up titration of duloxetine.  Will continue current dose to target depression.  Discussed risk, which includes but not limited to hypertension.  Will continue gabapentin for anxiety and shoulder pain.  Will continue hydroxyzine as needed for anxiety.  Validated her grief of loss of her parents. Discussed self compassion. She will see Ms. Bynum for therapy.   # Insomnia Per patient, she did have sleep study a few years ago, and negative for sleep apnea. Discussed sleep hygiene. She has been doubling the dose of temazepam; discussed potential risk of drowsiness and she is amenable to stay on original dose. She is also willing to try melatonin.   Plan 1. Continue duloxetine 60 mg daily  2. Continue gabapentin 100 mg three times a day  3. Start melatonin 3-6 mg at night as needed (2 hours before going to bed) 4. Continue Temazepam 30 mg at night as needed for sleep (she declined refill) 5. Continue hydroxyzine 50 mg daily prn for anxiety  6. Return to clinic in three months for 15 mins   The patient demonstrates the following risk factors for suicide: Chronic risk factors for suicide include: psychiatric disorder of depression and completed suicide in a family member. Acute risk factors for suicide include: loss (financial, interpersonal, professional) and recent discharge from inpatient psychiatry. Protective factors for this patient include: positive social support, responsibility to others (children, family), coping skills and hope for the future. Considering these factors, the overall suicide risk at this point appears to be low. Patient is appropriate for outpatient follow up.  The duration of this appointment visit was 30 minutes of face-to-face time with the patient.  Greater than 50% of this time was spent in counseling, explanation of  diagnosis, planning of further management, and coordination  of care.  Neysa Hotter, MD 10/08/2017, 8:26 AM

## 2017-10-14 ENCOUNTER — Ambulatory Visit: Payer: Self-pay

## 2017-10-16 DIAGNOSIS — Z809 Family history of malignant neoplasm, unspecified: Secondary | ICD-10-CM | POA: Diagnosis not present

## 2017-10-16 DIAGNOSIS — Z6841 Body Mass Index (BMI) 40.0 and over, adult: Secondary | ICD-10-CM | POA: Diagnosis not present

## 2017-10-16 DIAGNOSIS — G629 Polyneuropathy, unspecified: Secondary | ICD-10-CM | POA: Diagnosis not present

## 2017-10-16 DIAGNOSIS — G4700 Insomnia, unspecified: Secondary | ICD-10-CM | POA: Diagnosis not present

## 2017-10-16 DIAGNOSIS — R32 Unspecified urinary incontinence: Secondary | ICD-10-CM | POA: Diagnosis not present

## 2017-10-16 DIAGNOSIS — I10 Essential (primary) hypertension: Secondary | ICD-10-CM | POA: Diagnosis not present

## 2017-10-16 DIAGNOSIS — R69 Illness, unspecified: Secondary | ICD-10-CM | POA: Diagnosis not present

## 2017-10-16 DIAGNOSIS — F419 Anxiety disorder, unspecified: Secondary | ICD-10-CM | POA: Diagnosis not present

## 2017-10-16 DIAGNOSIS — Z7722 Contact with and (suspected) exposure to environmental tobacco smoke (acute) (chronic): Secondary | ICD-10-CM | POA: Diagnosis not present

## 2017-10-20 ENCOUNTER — Encounter: Payer: Self-pay | Admitting: Family Medicine

## 2017-10-20 DIAGNOSIS — Z09 Encounter for follow-up examination after completed treatment for conditions other than malignant neoplasm: Secondary | ICD-10-CM | POA: Insufficient documentation

## 2017-10-20 NOTE — Assessment & Plan Note (Signed)
May take OTC vit D3 1000 iU once daily

## 2017-10-20 NOTE — Assessment & Plan Note (Signed)
Deteriorated Patient educated about the importance of limiting  Carbohydrate intake , the need to commit to daily physical activity for a minimum of 30 minutes , and to commit weight loss. The fact that changes in all these areas will reduce or eliminate all together the development of diabetes is stressed.   Diabetic Labs Latest Ref Rng & Units 08/26/2017 08/14/2017 04/11/2017 10/02/2016 07/31/2015  HbA1c <5.7 % of total Hgb 6.1(H) - 6.0(H) 5.8(H) -  Microalbumin Not estab mg/dL - - - - 0.4  Micro/Creat Ratio <30 mcg/mg creat - - - - 4  Chol <200 mg/dL - - 497 026 -  HDL >37 mg/dL - - 51 51 -  Calc LDL mg/dL (calc) - - 60 92 -  Triglycerides <150 mg/dL - - 52 61 -  Creatinine 0.44 - 1.00 mg/dL - 8.58 8.50 2.77 -   BP/Weight 08/26/2017 08/14/2017 08/14/2017 08/14/2017 08/14/2017 04/07/2017 10/02/2016  Systolic BP 120 141 - 114 - 128 140  Diastolic BP 80 86 - 66 - 82 84  Wt. (Lbs) 353 - 356 - 356.04 365 370  BMI 66.7 - 65.11 - 67.27 66.76 67.67  Some encounter information is confidential and restricted. Go to Review Flowsheets activity to see all data.   Foot/eye exam completion dates 07/31/2015  Foot Form Completion Done

## 2017-10-20 NOTE — Progress Notes (Signed)
[name]     MRN: [id number]      DOB: 24-May-1960   HPI [name] is here for follow up of recent hospitalization from 5/30 to 08/17/2017 for severe depression. States she feels better following admission and realizes that she needed to be hospitalized for treatment so that she could improve. Also has now re committed to attention to her mental health Denies suicidal or homicidal ideation Wants to work on healthy lifestyle and weight loss also   ROS Denies recent fever or chills. Denies sinus pressure, nasal congestion, ear pain or sore throat. Denies chest congestion, productive cough or wheezing. Denies chest pains, palpitations and leg swelling Denies abdominal pain, nausea, vomiting,diarrhea or constipation.   Denies dysuria, frequency, hesitancy or incontinence. Denies uncontrolled  joint pain, swelling and limitation in mobility. Denies headaches, seizures, numbness, or tingling. C/o  depression, anxiety or insomnia. Denies skin break down or rash.   PE  BP 120/80   Pulse 72   Resp 16   Ht 5\' 1"  (1.549 m)   Wt (!) 353 lb (160.1 kg)   SpO2 96%   BMI 66.70 kg/m   Patient alert and oriented and in no cardiopulmonary distress.  HEENT: No facial asymmetry, EOMI,   oropharynx pink and moist.  Neck supple no JVD, no mass.  Chest: Clear to auscultation bilaterally.  CVS: S1, S2 no murmurs, no S3.Regular rate.  ABD: Soft non tender.   Ext: No edema  MS: Adequate ROM spine, shoulders, hips and knees.  Skin: Intact, no ulcerations or rash noted.  Psych: Good eye contact, normal affect. Memory intact not anxious or depressed appearing.  CNS: CN 2-12 intact, power,  normal throughout.no focal deficits noted.   Assessment & Plan  Hospital discharge follow-up Hospitalized from 5/30 to 08/17/2017 for major depression. Reports improvement with a commitment to attention to mental health Medications at discharge are being taken as prescribed and she  does have follow up appointment which she intends to keep  Morbid obesity Deteriorated. Patient re-educated about  the importance of commitment to a  minimum of 150 minutes of exercise per week.  The importance of healthy food choices with portion control discussed. Encouraged to start a food diary, count calories and to consider  joining a support group. Sample diet sheets offered. Goals set by the patient for the next several months.   Weight /BMI 08/26/2017 08/14/2017 08/14/2017  WEIGHT 353 lb 356 lb 356 lb 0.6 oz  HEIGHT 5\' 1"  5\' 2"  5\' 1"   BMI 66.7 kg/m2 65.11 kg/m2 67.27 kg/m2  Some encounter information is confidential and restricted. Go to Review Flowsheets activity to see all data.      Prediabetes Deteriorated Patient educated about the importance of limiting  Carbohydrate intake , the need to commit to daily physical activity for a minimum of 30 minutes , and to commit weight loss. The fact that changes in all these areas will reduce or eliminate all together the development of diabetes is stressed.   Diabetic Labs Latest Ref Rng & Units 08/26/2017 08/14/2017 04/11/2017 10/02/2016 07/31/2015  HbA1c <5.7 % of total Hgb 6.1(H) - 6.0(H) 5.8(H) -  Microalbumin Not estab mg/dL - - - - 0.4  Micro/Creat Ratio <30 mcg/mg creat - - - - 4  Chol <200 mg/dL - - 08/16/2017 04/13/2017 -  HDL 10/04/2016 mg/dL - - 51 51 -  Calc LDL mg/dL (calc) - - 60 92 -  Triglycerides <150 mg/dL - - 52 61 -  Creatinine 0.44 - 1.00 mg/dL - 6.28 3.15 1.76 -   BP/Weight 08/26/2017 08/14/2017 08/14/2017 08/14/2017 08/14/2017 04/07/2017 10/02/2016  Systolic BP 120 141 - 114 - 128 140  Diastolic BP 80 86 - 66 - 82 84  Wt. (Lbs) 353 - 356 - 356.04 365 370  BMI 66.7 - 65.11 - 67.27 66.76 67.67  Some encounter information is confidential and restricted. Go to Review Flowsheets activity to see all data.   Foot/eye exam completion dates 07/31/2015  Foot Form Completion Done      Vitamin D deficiency May take OTC vit D3 1000 iU once  daily

## 2017-10-20 NOTE — Assessment & Plan Note (Signed)
Deteriorated. Patient re-educated about  the importance of commitment to a  minimum of 150 minutes of exercise per week.  The importance of healthy food choices with portion control discussed. Encouraged to start a food diary, count calories and to consider  joining a support group. Sample diet sheets offered. Goals set by the patient for the next several months.   Weight /BMI 08/26/2017 08/14/2017 08/14/2017  WEIGHT 353 lb 356 lb 356 lb 0.6 oz  HEIGHT 5\' 1"  5\' 2"  5\' 1"   BMI 66.7 kg/m2 65.11 kg/m2 67.27 kg/m2  Some encounter information is confidential and restricted. Go to Review Flowsheets activity to see all data.

## 2017-10-20 NOTE — Assessment & Plan Note (Signed)
Hospitalized from 5/30 to 08/17/2017 for major depression. Reports improvement with a commitment to attention to mental health Medications at discharge are being taken as prescribed and she does have follow up appointment which she intends to keep

## 2017-10-21 ENCOUNTER — Other Ambulatory Visit (HOSPITAL_COMMUNITY): Payer: Self-pay | Admitting: Psychiatry

## 2017-10-21 MED ORDER — TEMAZEPAM 30 MG PO CAPS: 30.0000 mg | ORAL_CAPSULE | Freq: Every evening | ORAL | 1 refills | Status: DC | PRN

## 2017-10-28 ENCOUNTER — Ambulatory Visit (INDEPENDENT_AMBULATORY_CARE_PROVIDER_SITE_OTHER): Payer: Medicare HMO | Admitting: Psychiatry

## 2017-10-28 DIAGNOSIS — R69 Illness, unspecified: Secondary | ICD-10-CM | POA: Diagnosis not present

## 2017-10-28 DIAGNOSIS — F331 Major depressive disorder, recurrent, moderate: Secondary | ICD-10-CM

## 2017-10-28 NOTE — Progress Notes (Signed)
..              THERAPIST PROGRESS NOTE  Session Time: Tuesday 10/28/2017 4:10 PM - 4:53 PM                                      Participation Level: Active  Behavioral Response: CasualAlert/euthymic  Type of Therapy: Individual Therapy  Treatment Goals addressed:  appropriately grieve loss in order to normalize mood and to return to previously adaptive level of functioning  Interventions: Supportive  Summary: [name] is a 57 y.o. female who presents with a history of recurrent depression. She has had two psychiatric hospitalizations. She is a returning patient to this practice and clinician. She ws seen for medication management and outpatient psychotherapy. She is resuming services as symptoms of depression have worsened in recent months. Her 91 yo mother died on 04/16/17. Patient was her caretaker.  father died 3 years ago. Patient reports additional stress related to assisting her adult children with their financial and parenting responsibilities. Patient's symptoms include depressed mood, poor concentration, tearfulness, irritability, sleep difficulty, fatigue, feelings of hopelessness and worthlessness anxiety, and excessive worrying.  Patient last was seen about 2 months ago. She reports feeling much better since last session. She reports reduced financial stress as she is renting the house where her daughters have been residing to her ex-husband. She reports initially feeling guilty as daughters were unhappy about this. However, patient is pleased she used assertiveness skills, set, and enforced limits when daughters did not follow through with agreement to pay the mortgage. She reports being less depressed but still having thoughts of letting self down. She says she can't do what she really wants to do because of finances and having no time for self as she is always helping someone else. She reports managing grief/loss issues regarding mother better and recently visiting her  grave.  She still misses mother and reports still experiencing sadness along with times of thinking about mother a lot. She reports continued emotional support from children, siblings, and her boyfriend. Patient continues to work.    Suicidal/Homicidal: Nowithout intent/plan  Therapist Response:  reviewed symptoms, administered PHQ-9, discussed stressors, facilitated expression of thoughts and feelings, validated feelings, praised patient's use of assertiveness skills and discussed effects on her mood and behavior, discussed patient attending grief support group, assigned patient to contact group by next session, assisted patient explore ways to schedule time for self, assigned patient to continued positive self-care efforts    Plan: Return again in 2 weeks, patient agrees to implement strategies discussed in session   Diagnosis: Axis I: major depressive disorder, recurrent, moderate    Axis II: Deferred    Britt Theard, LCSW 10/28/2017

## 2017-11-12 ENCOUNTER — Ambulatory Visit (INDEPENDENT_AMBULATORY_CARE_PROVIDER_SITE_OTHER): Payer: Medicare HMO | Admitting: Psychiatry

## 2017-11-12 DIAGNOSIS — F331 Major depressive disorder, recurrent, moderate: Secondary | ICD-10-CM

## 2017-11-12 DIAGNOSIS — R69 Illness, unspecified: Secondary | ICD-10-CM | POA: Diagnosis not present

## 2017-11-12 NOTE — Progress Notes (Signed)
..              THERAPIST PROGRESS NOTE  Session Time:  Wednesday 11/12/2017 3:12 PM -  4:05 PM                                    Participation Level: Active  Behavioral Response: CasualAlert/euthymic  Type of Therapy: Individual Therapy  Treatment Goals addressed:  appropriately grieve loss in order to normalize mood and to return to previously adaptive level of functioning  Interventions: Supportive  Summary: [name] is a 57 y.o. female who presents with a history of recurrent depression. She has had two psychiatric hospitalizations. She is a returning patient to this practice and clinician. She ws seen for medication management and outpatient psychotherapy. She is resuming services as symptoms of depression have worsened in recent months. Her 33 yo mother died on 04/18/2017. Patient was her caretaker.  father died 3 years ago. Patient reports additional stress related to assisting her adult children with their financial and parenting responsibilities. Patient's symptoms include depressed mood, poor concentration, tearfulness, irritability, sleep difficulty, fatigue, feelings of hopelessness and worthlessness anxiety, and excessive worrying.  Patient last was seen about 2 weeks ago. She reports increased stress since due to issues with her children.  All her children and grandchildren have been residing with her for the past week. She expresses frustration especially with one of her daughters who can live with patient's ex-husband and his wife. She has set limits with daughter and hopes she will return to live with patient's ex-husband today. Patient reports increased thoughts about deceased father yesterday triggered by a conversation. She is very tearful in session as she describes narrative of his death as he died in a tractor accident. She reports visiting parents' grave yesterday and says it is becoming easier to deal with father's death but still experiencing difficulty managing  mother's death. During session, patient minimizes her feelings about mother and changes subject to issues with boyfriend.   Suicidal/Homicidal: Nowithout intent/plan  Therapist Response:  reviewed symptoms, discussed stressors, facilitated expression of thoughts and feelings, validated feelings, praised patient's use of assertiveness skills, provided psychoeducation on grief ( acute, complicated, and integrated grief), normalized patient's grief response to trigger about father, discussed avoidance of feelings regarding mother's death and effects, assigned patient to bring 3 objects that remind her of her mother to next session,  Plan: Return again in 2 weeks  Diagnosis: Axis I: major depressive disorder, recurrent, moderate    Axis II: Deferred    Jermal Dismuke, LCSW 11/12/2017

## 2017-11-13 ENCOUNTER — Encounter (HOSPITAL_COMMUNITY): Payer: Self-pay | Admitting: Psychiatry

## 2017-11-27 ENCOUNTER — Ambulatory Visit (INDEPENDENT_AMBULATORY_CARE_PROVIDER_SITE_OTHER): Payer: Medicare HMO | Admitting: Psychiatry

## 2017-11-27 DIAGNOSIS — F331 Major depressive disorder, recurrent, moderate: Secondary | ICD-10-CM | POA: Diagnosis not present

## 2017-11-27 DIAGNOSIS — R69 Illness, unspecified: Secondary | ICD-10-CM | POA: Diagnosis not present

## 2017-11-27 NOTE — Progress Notes (Signed)
..              THERAPIST PROGRESS NOTE  Session Time:  Thursday 11/27/2017 2:14 PM - 3:00 PM                               Participation Level: Active  Behavioral Response: CasualAlert/anxious/tearful at times  Type of Therapy: Individual Therapy  Treatment Goals addressed:  appropriately grieve loss in order to normalize mood and to return to previously adaptive level of functioning  Interventions: Supportive  Summary: [name] is a 57 y.o. female who presents with a history of recurrent depression. She has had two psychiatric hospitalizations. She is a returning patient to this practice and clinician. She ws seen for medication management and outpatient psychotherapy. She is resuming services as symptoms of depression have worsened in recent months. Her 50 yo mother died on April 22, 2017. Patient was her caretaker.  father died 3 years ago. Patient reports additional stress related to assisting her adult children with their financial and parenting responsibilities. Patient's symptoms include depressed mood, poor concentration, tearfulness, irritability, sleep difficulty, fatigue, feelings of hopelessness and worthlessness anxiety, and excessive worrying.  Patient last was seen about 2 weeks ago. She reports she has set limits with daughter and has had positive response. However, her house remains crowded. She reports she has taken a few days off work to have time alone during the day. She continues to experience grief and loss issues regarding mother. She brings 3 objects requested to today' session. She becomes very tearful as she discusses the objects and t significance to her mother. She also shares positive memories about mother as well as verbalizes feelings of anger,abandonment, and rejection that mother died when patient was not in the room with mother. She expresses regret she did not say some things to mother she wished she had said.  Suicidal/Homicidal: Nowithout  intent/plan  Therapist Response:   discussed stressors, facilitated expression of thoughts and feelings, validated feelings, praised patient's use of assertiveness skills with daughter, used discussion of objects brought to session to assist patient verbalize feelings associated with her loss/express thoughts and feelings about mother,/resolve issues of conflict related to her mother and create positive memories of of mother to help balance the pain of loss, normalized feelings associated with loss  Plan: Return again in 2 weeks  Diagnosis: Axis I: major depressive disorder, recurrent, moderate    Axis II: Deferred    Rhapsody Wolven, LCSW 11/27/2017

## 2017-12-04 ENCOUNTER — Ambulatory Visit (HOSPITAL_COMMUNITY): Payer: Self-pay | Admitting: Psychiatry

## 2017-12-11 DIAGNOSIS — R635 Abnormal weight gain: Secondary | ICD-10-CM | POA: Diagnosis not present

## 2017-12-11 DIAGNOSIS — N951 Menopausal and female climacteric states: Secondary | ICD-10-CM | POA: Diagnosis not present

## 2017-12-15 DIAGNOSIS — I10 Essential (primary) hypertension: Secondary | ICD-10-CM | POA: Diagnosis not present

## 2017-12-15 DIAGNOSIS — Z1339 Encounter for screening examination for other mental health and behavioral disorders: Secondary | ICD-10-CM | POA: Diagnosis not present

## 2017-12-15 DIAGNOSIS — R718 Other abnormality of red blood cells: Secondary | ICD-10-CM | POA: Diagnosis not present

## 2017-12-15 DIAGNOSIS — R5383 Other fatigue: Secondary | ICD-10-CM | POA: Diagnosis not present

## 2017-12-15 DIAGNOSIS — Z1331 Encounter for screening for depression: Secondary | ICD-10-CM | POA: Diagnosis not present

## 2017-12-15 DIAGNOSIS — N951 Menopausal and female climacteric states: Secondary | ICD-10-CM | POA: Diagnosis not present

## 2017-12-15 DIAGNOSIS — Z72820 Sleep deprivation: Secondary | ICD-10-CM | POA: Diagnosis not present

## 2017-12-18 ENCOUNTER — Ambulatory Visit (HOSPITAL_COMMUNITY): Payer: Self-pay | Admitting: Psychiatry

## 2017-12-18 ENCOUNTER — Ambulatory Visit (INDEPENDENT_AMBULATORY_CARE_PROVIDER_SITE_OTHER): Payer: Medicare HMO | Admitting: Psychiatry

## 2017-12-18 ENCOUNTER — Ambulatory Visit (INDEPENDENT_AMBULATORY_CARE_PROVIDER_SITE_OTHER): Payer: Medicare HMO | Admitting: Family Medicine

## 2017-12-18 ENCOUNTER — Encounter: Payer: Self-pay | Admitting: Family Medicine

## 2017-12-18 VITALS — BP 122/66 | HR 60 | Resp 12 | Ht 62.0 in | Wt 360.0 lb

## 2017-12-18 DIAGNOSIS — I10 Essential (primary) hypertension: Secondary | ICD-10-CM | POA: Diagnosis not present

## 2017-12-18 DIAGNOSIS — R69 Illness, unspecified: Secondary | ICD-10-CM | POA: Diagnosis not present

## 2017-12-18 DIAGNOSIS — R7303 Prediabetes: Secondary | ICD-10-CM

## 2017-12-18 DIAGNOSIS — Z23 Encounter for immunization: Secondary | ICD-10-CM

## 2017-12-18 DIAGNOSIS — R928 Other abnormal and inconclusive findings on diagnostic imaging of breast: Secondary | ICD-10-CM

## 2017-12-18 DIAGNOSIS — F331 Major depressive disorder, recurrent, moderate: Secondary | ICD-10-CM

## 2017-12-18 MED ORDER — IBUPROFEN 800 MG PO TABS: ORAL_TABLET | ORAL | 1 refills | Status: DC

## 2017-12-18 MED ORDER — IBUPROFEN 800 MG PO TABS: ORAL_TABLET | ORAL | 0 refills | Status: DC

## 2017-12-18 MED ORDER — ACYCLOVIR 400 MG PO TABS: 400.0000 mg | ORAL_TABLET | Freq: Three times a day (TID) | ORAL | 0 refills | Status: DC

## 2017-12-18 NOTE — Patient Instructions (Addendum)
Wellness with nurse in 2019 if due, I believe that it is  Physical exam with MD end February  We will schedule  diag right breast mammogram due in early Feb and contact you  BEST with mindful eating and exercise commitment  Flu vaccine today  Thank you  for choosing [facility]. We consider it a privelige to serve you.  Delivering excellent health care in a caring and  compassionate way is our goal.  Partnering with you,  so that together we can achieve this goal is our strategy.    Need fasting lipid, cmp and EGFr , TSH and HBa1C 1 week before January visit 

## 2017-12-18 NOTE — Progress Notes (Signed)
..              THERAPIST PROGRESS NOTE  Session Time:  Thursday 12/18/2017 10:17 AM - 11:05 AM                               Participation Level: Active  Behavioral Response: CasualAlert/anxious/tearful at times  Type of Therapy: Individual Therapy  Treatment Goals addressed:  appropriately grieve loss in order to normalize mood and to return to previously adaptive level of functioning  Interventions: Supportive  Summary: [name] is a 57 y.o. female who presents with a history of recurrent depression. She has had two psychiatric hospitalizations. She is a returning patient to this practice and clinician. She ws seen for medication management and outpatient psychotherapy. She is resuming services as symptoms of depression have worsened in recent months. Her 55 yo mother died on 04/23/2017. Patient was her caretaker.  father died 3 years ago. Patient reports additional stress related to assisting her adult children with their financial and parenting responsibilities. Patient's symptoms include depressed mood, poor concentration, tearfulness, irritability, sleep difficulty, fatigue, feelings of hopelessness and worthlessness anxiety, and excessive worrying.  Patient last was seen about 2 weeks ago. She reports continued stress regarding having a crowded household. However, she continues to set and maintain limits with her family and has had a positive response. She also is scheduling more time for self including having a long time away from home. She also is looking forward to going on a weekend trip with boyfriend next weekend. Patient reports feeling a little less angry with her mother since last session as she was able to verbalize her feelings. She continues to have unresolved grief and loss issues regarding both of her parents as she states feeling as though both of them abandoned her.  Patient also identifies and verbalizes anger with one of her other family members as this member sent  her on an assignment for her job. While on the assignment, patient's mother died. Patient becomes very tearful in session as she states she feels like she let her mother and father down since she was not in the room when her mother died. Per patient's report, father told her prior to his death to take care of her mother. Patient reports avoidance of people and situations that remind her of her mother.  Suicidal/Homicidal: Nowithout intent/plan  Therapist Response:   Praised and reinforced patient's efforts to set and maintain limits with her family,discussed stressors, facilitated expression of thoughts and feelings to address unresolved grief and loss issues, assisted patient explore and identify underlying feelings beneath anger, assisted patient identify and verbalize feelings of guilt about not being in the room when her mother died, discussed guilt as part of the grieving process,assisted patient identify/challenge/and replace unhelpful thoughts with helpful thoughts to dispel inappropriate guilt, provided patient with handout on the 5 stages of grief and assigned her to review Plan: Return again in 2 weeks  Diagnosis: Axis I: major depressive disorder, recurrent, moderate    Axis II: Deferred    Amiaya Mcneeley, LCSW 12/18/2017

## 2017-12-20 ENCOUNTER — Encounter: Payer: Self-pay | Admitting: Family Medicine

## 2017-12-20 ENCOUNTER — Telehealth: Payer: Self-pay | Admitting: Family Medicine

## 2017-12-20 NOTE — Assessment & Plan Note (Signed)
IMPROVED AND MANAGED BY PSYCHIATRY. NOT HOMICIDAL OR SUICIDAL

## 2017-12-20 NOTE — Assessment & Plan Note (Signed)
Deteriorated. Patient re-educated about  the importance of commitment to a  minimum of 150 minutes of exercise per week.  The importance of healthy food choices with portion control discussed. Encouraged to start a food diary, count calories and to consider  joining a support group. Sample diet sheets offered. Goals set by the patient for the next several months.   Weight /BMI 12/18/2017 08/26/2017 08/14/2017  WEIGHT 360 lb 353 lb 356 lb  HEIGHT 5\' 2"  5\' 1"  5\' 2"   BMI 65.84 kg/m2 66.7 kg/m2 65.11 kg/m2  Some encounter information is confidential and restricted. Go to Review Flowsheets activity to see all data.

## 2017-12-20 NOTE — Progress Notes (Signed)
[name]     MRN: [id number]      DOB: 1960/06/03   HPI [name] is here for follow up and re-evaluation of chronic medical conditions, medication management and review of any available recent lab and radiology data.  Preventive health is updated, specifically  Cancer screening and Immunization.   Questions or concerns regarding consultations or procedures which the PT has had in the interim are  addressed. The PT denies any adverse reactions to current medications since the last visit.  There are no new concerns.  There are no specific complaints   ROS Denies recent fever or chills. Denies sinus pressure, nasal congestion, ear pain or sore throat. Denies chest congestion, productive cough or wheezing. Denies chest pains, palpitations and leg swelling Denies abdominal pain, nausea, vomiting,diarrhea or constipation.   Denies dysuria, frequency, hesitancy or incontinence. Denies joint pain, swelling and limitation in mobility. Denies headaches, seizures, numbness, or tingling. Denies depression, anxiety or insomnia. Denies skin break down or rash.   PE  BP 122/66 (BP Location: Left Arm, Patient Position: Sitting) Comment (Cuff Size): thigh  Pulse 60   Resp 12   Ht 5\' 2"  (1.575 m)   Wt (!) 360 lb (163.3 kg)   SpO2 97% Comment: room air  BMI 65.84 kg/m   Patient alert and oriented and in no cardiopulmonary distress.  HEENT: No facial asymmetry, EOMI,   oropharynx pink and moist.  Neck supple no JVD, no mass.  Chest: Clear to auscultation bilaterally.  CVS: S1, S2 no murmurs, no S3.Regular rate.  ABD: Soft non tender.   Ext: No edema  MS: Adequate ROM spine, shoulders, hips and knees.  Skin: Intact, no ulcerations or rash noted.  Psych: Good eye contact, normal affect. Memory intact not anxious or depressed appearing.  CNS: CN 2-12 intact, power,  normal throughout.no focal deficits noted.   Assessment & Plan  Essential  hypertension Controlled, no change in medication DASH diet and commitment to daily physical activity for a minimum of 30 minutes discussed and encouraged, as a part of hypertension management. The importance of attaining a healthy weight is also discussed.  BP/Weight 12/18/2017 08/26/2017 08/14/2017 08/14/2017 08/14/2017 08/14/2017 04/07/2017  Systolic BP 122 120 141 - 114 - 128  Diastolic BP 66 80 86 - 66 - 82  Wt. (Lbs) 360 353 - 356 - 356.04 365  BMI 65.84 66.7 - 65.11 - 67.27 66.76  Some encounter information is confidential and restricted. Go to Review Flowsheets activity to see all data.       Morbid obesity Deteriorated. Patient re-educated about  the importance of commitment to a  minimum of 150 minutes of exercise per week.  The importance of healthy food choices with portion control discussed. Encouraged to start a food diary, count calories and to consider  joining a support group. Sample diet sheets offered. Goals set by the patient for the next several months.   Weight /BMI 12/18/2017 08/26/2017 08/14/2017  WEIGHT 360 lb 353 lb 356 lb  HEIGHT 5\' 2"  5\' 1"  5\' 2"   BMI 65.84 kg/m2 66.7 kg/m2 65.11 kg/m2  Some encounter information is confidential and restricted. Go to Review Flowsheets activity to see all data.      Major depressive disorder, recurrent episode, moderate (HCC) IMPROVED AND MANAGED BY PSYCHIATRY. NOT HOMICIDAL OR SUICIDAL  Prediabetes Patient educated about the importance of limiting  Carbohydrate intake , the need to commit to daily physical activity for a minimum of 30 minutes , and  to commit weight loss. The fact that changes in all these areas will reduce or eliminate all together the development of diabetes is stressed.  Deteriorated  Updated lab needed at/ before next visit.   Diabetic Labs Latest Ref Rng & Units 08/26/2017 08/14/2017 04/11/2017 10/02/2016 07/31/2015  HbA1c <5.7 % of total Hgb 6.1(H) - 6.0(H) 5.8(H) -  Microalbumin Not estab mg/dL - - - -  0.4  Micro/Creat Ratio <30 mcg/mg creat - - - - 4  Chol <200 mg/dL - - 440 102 -  HDL >72 mg/dL - - 51 51 -  Calc LDL mg/dL (calc) - - 60 92 -  Triglycerides <150 mg/dL - - 52 61 -  Creatinine 0.44 - 1.00 mg/dL - 5.36 6.44 0.34 -   BP/Weight 12/18/2017 08/26/2017 08/14/2017 08/14/2017 08/14/2017 08/14/2017 04/07/2017  Systolic BP 122 120 141 - 114 - 128  Diastolic BP 66 80 86 - 66 - 82  Wt. (Lbs) 360 353 - 356 - 356.04 365  BMI 65.84 66.7 - 65.11 - 67.27 66.76  Some encounter information is confidential and restricted. Go to Review Flowsheets activity to see all data.   Foot/eye exam completion dates 07/31/2015  Foot Form Completion Done

## 2017-12-20 NOTE — Assessment & Plan Note (Signed)
Patient educated about the importance of limiting  Carbohydrate intake , the need to commit to daily physical activity for a minimum of 30 minutes , and to commit weight loss. The fact that changes in all these areas will reduce or eliminate all together the development of diabetes is stressed.  Deteriorated  Updated lab needed at/ before next visit.   Diabetic Labs Latest Ref Rng & Units 08/26/2017 08/14/2017 04/11/2017 10/02/2016 07/31/2015  HbA1c <5.7 % of total Hgb 6.1(H) - 6.0(H) 5.8(H) -  Microalbumin Not estab mg/dL - - - - 0.4  Micro/Creat Ratio <30 mcg/mg creat - - - - 4  Chol <200 mg/dL - - 017 510 -  HDL >25 mg/dL - - 51 51 -  Calc LDL mg/dL (calc) - - 60 92 -  Triglycerides <150 mg/dL - - 52 61 -  Creatinine 0.44 - 1.00 mg/dL - 8.52 7.78 2.42 -   BP/Weight 12/18/2017 08/26/2017 08/14/2017 08/14/2017 08/14/2017 08/14/2017 04/07/2017  Systolic BP 122 120 141 - 114 - 128  Diastolic BP 66 80 86 - 66 - 82  Wt. (Lbs) 360 353 - 356 - 356.04 365  BMI 65.84 66.7 - 65.11 - 67.27 66.76  Some encounter information is confidential and restricted. Go to Review Flowsheets activity to see all data.   Foot/eye exam completion dates 07/31/2015  Foot Form Completion Done

## 2017-12-20 NOTE — Assessment & Plan Note (Signed)
Controlled, no change in medication DASH diet and commitment to daily physical activity for a minimum of 30 minutes discussed and encouraged, as a part of hypertension management. The importance of attaining a healthy weight is also discussed.  BP/Weight 12/18/2017 08/26/2017 08/14/2017 08/14/2017 08/14/2017 08/14/2017 04/07/2017  Systolic BP 122 120 141 - 114 - 128  Diastolic BP 66 80 86 - 66 - 82  Wt. (Lbs) 360 353 - 356 - 356.04 365  BMI 65.84 66.7 - 65.11 - 67.27 66.76  Some encounter information is confidential and restricted. Go to Review Flowsheets activity to see all data.

## 2017-12-20 NOTE — Telephone Encounter (Signed)
pls mail pt a lab order , she needs fasting lipid, cmp and EGFr, hBa1C and TSH 1 week before her appt with  me next Feb, thanks, I did not request at visit but she needs it She also needs a diag right mammogram AFTER Jan 17 as follow up odf abn in July, please schedule that also so that you  Mail her both things at one time.  ??/concerns pls let me know Thanks

## 2017-12-24 ENCOUNTER — Ambulatory Visit: Payer: Self-pay

## 2017-12-25 ENCOUNTER — Telehealth: Payer: Self-pay

## 2017-12-25 DIAGNOSIS — R928 Other abnormal and inconclusive findings on diagnostic imaging of breast: Secondary | ICD-10-CM

## 2017-12-25 DIAGNOSIS — Z13220 Encounter for screening for lipoid disorders: Secondary | ICD-10-CM

## 2017-12-25 DIAGNOSIS — I10 Essential (primary) hypertension: Secondary | ICD-10-CM

## 2017-12-25 DIAGNOSIS — R7303 Prediabetes: Secondary | ICD-10-CM

## 2017-12-25 NOTE — Telephone Encounter (Signed)
Orders entered per Dr.Simpson

## 2017-12-29 ENCOUNTER — Ambulatory Visit (INDEPENDENT_AMBULATORY_CARE_PROVIDER_SITE_OTHER): Payer: Medicare HMO

## 2017-12-29 DIAGNOSIS — Z0000 Encounter for general adult medical examination without abnormal findings: Secondary | ICD-10-CM | POA: Diagnosis not present

## 2017-12-29 NOTE — Patient Instructions (Incomplete)
[name] , Thank you for taking time to come for your Medicare Wellness Visit. I appreciate your ongoing commitment to your health goals. Please review the following plan we discussed and let me know if I can assist you in the future.   Screening recommendations/referrals: Colonoscopy: up to date  Mammogram: *** Bone Density: *** Recommended yearly ophthalmology/optometry visit for glaucoma screening and checkup Recommended yearly dental visit for hygiene and checkup  Vaccinations: Influenza vaccine: *** Pneumococcal vaccine: *** Tdap vaccine: *** Shingles vaccine: ***    Advanced directives: ***  Conditions/risks identified: ***  Next appointment: ***  Preventive Care 40-64 Years, Female Preventive care refers to lifestyle choices and visits with your health care provider that can promote health and wellness. What does preventive care include?  A yearly physical exam. This is also called an annual well check.  Dental exams once or twice a year.  Routine eye exams. Ask your health care provider how often you should have your eyes checked.  Personal lifestyle choices, including:  Daily care of your teeth and gums.  Regular physical activity.  Eating a healthy diet.  Avoiding tobacco and drug use.  Limiting alcohol use.  Practicing safe sex.  Taking low-dose aspirin daily starting at age 61.  Taking vitamin and mineral supplements as recommended by your health care provider. What happens during an annual well check? The services and screenings done by your health care provider during your annual well check will depend on your age, overall health, lifestyle risk factors, and family history of disease. Counseling  Your health care provider may ask you questions about your:  Alcohol use.  Tobacco use.  Drug use.  Emotional well-being.  Home and relationship well-being.  Sexual activity.  Eating habits.  Work and work Statistician.  Method of birth  control.  Menstrual cycle.  Pregnancy history. Screening  You may have the following tests or measurements:  Height, weight, and BMI.  Blood pressure.  Lipid and cholesterol levels. These may be checked every 5 years, or more frequently if you are over 31 years old.  Skin check.  Lung cancer screening. You may have this screening every year starting at age 35 if you have a 30-pack-year history of smoking and currently smoke or have quit within the past 15 years.  Fecal occult blood test (FOBT) of the stool. You may have this test every year starting at age 42.  Flexible sigmoidoscopy or colonoscopy. You may have a sigmoidoscopy every 5 years or a colonoscopy every 10 years starting at age 45.  Hepatitis C blood test.  Hepatitis B blood test.  Sexually transmitted disease (STD) testing.  Diabetes screening. This is done by checking your blood sugar (glucose) after you have not eaten for a while (fasting). You may have this done every 1-3 years.  Mammogram. This may be done every 1-2 years. Talk to your health care provider about when you should start having regular mammograms. This may depend on whether you have a family history of breast cancer.  BRCA-related cancer screening. This may be done if you have a family history of breast, ovarian, tubal, or peritoneal cancers.  Pelvic exam and Pap test. This may be done every 3 years starting at age 50. Starting at age 53, this may be done every 5 years if you have a Pap test in combination with an HPV test.  Bone density scan. This is done to screen for osteoporosis. You may have this scan if you are at high  risk for osteoporosis. Discuss your test results, treatment options, and if necessary, the need for more tests with your health care provider. Vaccines  Your health care provider may recommend certain vaccines, such as:  Influenza vaccine. This is recommended every year.  Tetanus, diphtheria, and acellular pertussis (Tdap,  Td) vaccine. You may need a Td booster every 10 years.  Zoster vaccine. You may need this after age 18.  Pneumococcal 13-valent conjugate (PCV13) vaccine. You may need this if you have certain conditions and were not previously vaccinated.  Pneumococcal polysaccharide (PPSV23) vaccine. You may need one or two doses if you smoke cigarettes or if you have certain conditions. Talk to your health care provider about which screenings and vaccines you need and how often you need them. This information is not intended to replace advice given to you by your health care provider. Make sure you discuss any questions you have with your health care provider. Document Released: 03/31/2015 Document Revised: 11/22/2015 Document Reviewed: 01/03/2015 Elsevier Interactive Patient Education  2017 [place] Prevention in the Home Falls can cause injuries. They can happen to people of all ages. There are many things you can do to make your home safe and to help prevent falls. What can I do on the outside of my home?  Regularly fix the edges of walkways and driveways and fix any cracks.  Remove anything that might make you trip as you walk through a door, such as a raised step or threshold.  Trim any bushes or trees on the path to your home.  Use bright outdoor lighting.  Clear any walking paths of anything that might make someone trip, such as rocks or tools.  Regularly check to see if handrails are loose or broken. Make sure that both sides of any steps have handrails.  Any raised decks and porches should have guardrails on the edges.  Have any leaves, snow, or ice cleared regularly.  Use sand or salt on walking paths during winter.  Clean up any spills in your garage right away. This includes oil or grease spills. What can I do in the bathroom?  Use night lights.  Install grab bars by the toilet and in the tub and shower. Do not use towel bars as grab bars.  Use non-skid mats or  decals in the tub or shower.  If you need to sit down in the shower, use a plastic, non-slip stool.  Keep the floor dry. Clean up any water that spills on the floor as soon as it happens.  Remove soap buildup in the tub or shower regularly.  Attach bath mats securely with double-sided non-slip rug tape.  Do not have throw rugs and other things on the floor that can make you trip. What can I do in the bedroom?  Use night lights.  Make sure that you have a light by your bed that is easy to reach.  Do not use any sheets or blankets that are too big for your bed. They should not hang down onto the floor.  Have a firm chair that has side arms. You can use this for support while you get dressed.  Do not have throw rugs and other things on the floor that can make you trip. What can I do in the kitchen?  Clean up any spills right away.  Avoid walking on wet floors.  Keep items that you use a lot in easy-to-reach places.  If you need to reach something  above you, use a strong step stool that has a grab bar.  Keep electrical cords out of the way.  Do not use floor polish or wax that makes floors slippery. If you must use wax, use non-skid floor wax.  Do not have throw rugs and other things on the floor that can make you trip. What can I do with my stairs?  Do not leave any items on the stairs.  Make sure that there are handrails on both sides of the stairs and use them. Fix handrails that are broken or loose. Make sure that handrails are as long as the stairways.  Check any carpeting to make sure that it is firmly attached to the stairs. Fix any carpet that is loose or worn.  Avoid having throw rugs at the top or bottom of the stairs. If you do have throw rugs, attach them to the floor with carpet tape.  Make sure that you have a light switch at the top of the stairs and the bottom of the stairs. If you do not have them, ask someone to add them for you. What else can I do to  help prevent falls?  Wear shoes that:  Do not have high heels.  Have rubber bottoms.  Are comfortable and fit you well.  Are closed at the toe. Do not wear sandals.  If you use a stepladder:  Make sure that it is fully opened. Do not climb a closed stepladder.  Make sure that both sides of the stepladder are locked into place.  Ask someone to hold it for you, if possible.  Clearly mark and make sure that you can see:  Any grab bars or handrails.  First and last steps.  Where the edge of each step is.  Use tools that help you move around (mobility aids) if they are needed. These include:  Canes.  Walkers.  Scooters.  Crutches.  Turn on the lights when you go into a dark area. Replace any light bulbs as soon as they burn out.  Set up your furniture so you have a clear path. Avoid moving your furniture around.  If any of your floors are uneven, fix them.  If there are any pets around you, be aware of where they are.  Review your medicines with your doctor. Some medicines can make you feel dizzy. This can increase your chance of falling. Ask your doctor what other things that you can do to help prevent falls. This information is not intended to replace advice given to you by your health care provider. Make sure you discuss any questions you have with your health care provider. Document Released: 12/29/2008 Document Revised: 08/10/2015 Document Reviewed: 04/08/2014 Elsevier Interactive Patient Education  2017 [employer].

## 2018-01-01 ENCOUNTER — Ambulatory Visit (INDEPENDENT_AMBULATORY_CARE_PROVIDER_SITE_OTHER): Payer: Medicare HMO

## 2018-01-01 ENCOUNTER — Encounter (HOSPITAL_COMMUNITY): Payer: Self-pay | Admitting: Psychiatry

## 2018-01-01 ENCOUNTER — Ambulatory Visit (INDEPENDENT_AMBULATORY_CARE_PROVIDER_SITE_OTHER): Payer: Medicare HMO | Admitting: Psychiatry

## 2018-01-01 VITALS — BP 139/80 | HR 73 | Resp 12 | Ht 62.0 in | Wt 368.0 lb

## 2018-01-01 DIAGNOSIS — F331 Major depressive disorder, recurrent, moderate: Secondary | ICD-10-CM

## 2018-01-01 DIAGNOSIS — R69 Illness, unspecified: Secondary | ICD-10-CM | POA: Diagnosis not present

## 2018-01-01 DIAGNOSIS — Z0000 Encounter for general adult medical examination without abnormal findings: Secondary | ICD-10-CM

## 2018-01-01 NOTE — Patient Instructions (Signed)
[name] , Thank you for taking time to come for your Medicare Wellness Visit. I appreciate your ongoing commitment to your health goals. Please review the following plan we discussed and let me know if I can assist you in the future.   Screening recommendations/referrals: Colonoscopy: up to date  Mammogram: ordered  Bone Density: N/A Recommended yearly ophthalmology/optometry visit for glaucoma screening and checkup Recommended yearly dental visit for hygiene and checkup  Vaccinations: Influenza vaccine: up to date  Pneumococcal vaccine: up to date  Tdap vaccine: Postponed  Shingles vaccine: will ask insurance    Advanced directives: information given   Conditions/risks identified: fall risk due to limited mobility, hypertension  Next appointment: wellness visit in one year   Preventive Care 40-64 Years, Female Preventive care refers to lifestyle choices and visits with your health care provider that can promote health and wellness. What does preventive care include?  A yearly physical exam. This is also called an annual well check.  Dental exams once or twice a year.  Routine eye exams. Ask your health care provider how often you should have your eyes checked.  Personal lifestyle choices, including:  Daily care of your teeth and gums.  Regular physical activity.  Eating a healthy diet.  Avoiding tobacco and drug use.  Limiting alcohol use.  Practicing safe sex.  Taking low-dose aspirin daily starting at age 41.  Taking vitamin and mineral supplements as recommended by your health care provider. What happens during an annual well check? The services and screenings done by your health care provider during your annual well check will depend on your age, overall health, lifestyle risk factors, and family history of disease. Counseling  Your health care provider may ask you questions about your:  Alcohol use.  Tobacco use.  Drug use.  Emotional  well-being.  Home and relationship well-being.  Sexual activity.  Eating habits.  Work and work Statistician.  Method of birth control.  Menstrual cycle.  Pregnancy history. Screening  You may have the following tests or measurements:  Height, weight, and BMI.  Blood pressure.  Lipid and cholesterol levels. These may be checked every 5 years, or more frequently if you are over 77 years old.  Skin check.  Lung cancer screening. You may have this screening every year starting at age 30 if you have a 30-pack-year history of smoking and currently smoke or have quit within the past 15 years.  Fecal occult blood test (FOBT) of the stool. You may have this test every year starting at age 50.  Flexible sigmoidoscopy or colonoscopy. You may have a sigmoidoscopy every 5 years or a colonoscopy every 10 years starting at age 46.  Hepatitis C blood test.  Hepatitis B blood test.  Sexually transmitted disease (STD) testing.  Diabetes screening. This is done by checking your blood sugar (glucose) after you have not eaten for a while (fasting). You may have this done every 1-3 years.  Mammogram. This may be done every 1-2 years. Talk to your health care provider about when you should start having regular mammograms. This may depend on whether you have a family history of breast cancer.  BRCA-related cancer screening. This may be done if you have a family history of breast, ovarian, tubal, or peritoneal cancers.  Pelvic exam and Pap test. This may be done every 3 years starting at age 82. Starting at age 39, this may be done every 5 years if you have a Pap test in combination with an  HPV test.  Bone density scan. This is done to screen for osteoporosis. You may have this scan if you are at high risk for osteoporosis. Discuss your test results, treatment options, and if necessary, the need for more tests with your health care provider. Vaccines  Your health care provider may recommend  certain vaccines, such as:  Influenza vaccine. This is recommended every year.  Tetanus, diphtheria, and acellular pertussis (Tdap, Td) vaccine. You may need a Td booster every 10 years.  Zoster vaccine. You may need this after age 45.  Pneumococcal 13-valent conjugate (PCV13) vaccine. You may need this if you have certain conditions and were not previously vaccinated.  Pneumococcal polysaccharide (PPSV23) vaccine. You may need one or two doses if you smoke cigarettes or if you have certain conditions. Talk to your health care provider about which screenings and vaccines you need and how often you need them. This information is not intended to replace advice given to you by your health care provider. Make sure you discuss any questions you have with your health care provider. Document Released: 03/31/2015 Document Revised: 11/22/2015 Document Reviewed: 01/03/2015 Elsevier Interactive Patient Education  2017 [place] Prevention in the Home Falls can cause injuries. They can happen to people of all ages. There are many things you can do to make your home safe and to help prevent falls. What can I do on the outside of my home?  Regularly fix the edges of walkways and driveways and fix any cracks.  Remove anything that might make you trip as you walk through a door, such as a raised step or threshold.  Trim any bushes or trees on the path to your home.  Use bright outdoor lighting.  Clear any walking paths of anything that might make someone trip, such as rocks or tools.  Regularly check to see if handrails are loose or broken. Make sure that both sides of any steps have handrails.  Any raised decks and porches should have guardrails on the edges.  Have any leaves, snow, or ice cleared regularly.  Use sand or salt on walking paths during winter.  Clean up any spills in your garage right away. This includes oil or grease spills. What can I do in the bathroom?  Use  night lights.  Install grab bars by the toilet and in the tub and shower. Do not use towel bars as grab bars.  Use non-skid mats or decals in the tub or shower.  If you need to sit down in the shower, use a plastic, non-slip stool.  Keep the floor dry. Clean up any water that spills on the floor as soon as it happens.  Remove soap buildup in the tub or shower regularly.  Attach bath mats securely with double-sided non-slip rug tape.  Do not have throw rugs and other things on the floor that can make you trip. What can I do in the bedroom?  Use night lights.  Make sure that you have a light by your bed that is easy to reach.  Do not use any sheets or blankets that are too big for your bed. They should not hang down onto the floor.  Have a firm chair that has side arms. You can use this for support while you get dressed.  Do not have throw rugs and other things on the floor that can make you trip. What can I do in the kitchen?  Clean up any spills right away.  Avoid walking on wet floors.  Keep items that you use a lot in easy-to-reach places.  If you need to reach something above you, use a strong step stool that has a grab bar.  Keep electrical cords out of the way.  Do not use floor polish or wax that makes floors slippery. If you must use wax, use non-skid floor wax.  Do not have throw rugs and other things on the floor that can make you trip. What can I do with my stairs?  Do not leave any items on the stairs.  Make sure that there are handrails on both sides of the stairs and use them. Fix handrails that are broken or loose. Make sure that handrails are as long as the stairways.  Check any carpeting to make sure that it is firmly attached to the stairs. Fix any carpet that is loose or worn.  Avoid having throw rugs at the top or bottom of the stairs. If you do have throw rugs, attach them to the floor with carpet tape.  Make sure that you have a light switch at  the top of the stairs and the bottom of the stairs. If you do not have them, ask someone to add them for you. What else can I do to help prevent falls?  Wear shoes that:  Do not have high heels.  Have rubber bottoms.  Are comfortable and fit you well.  Are closed at the toe. Do not wear sandals.  If you use a stepladder:  Make sure that it is fully opened. Do not climb a closed stepladder.  Make sure that both sides of the stepladder are locked into place.  Ask someone to hold it for you, if possible.  Clearly mark and make sure that you can see:  Any grab bars or handrails.  First and last steps.  Where the edge of each step is.  Use tools that help you move around (mobility aids) if they are needed. These include:  Canes.  Walkers.  Scooters.  Crutches.  Turn on the lights when you go into a dark area. Replace any light bulbs as soon as they burn out.  Set up your furniture so you have a clear path. Avoid moving your furniture around.  If any of your floors are uneven, fix them.  If there are any pets around you, be aware of where they are.  Review your medicines with your doctor. Some medicines can make you feel dizzy. This can increase your chance of falling. Ask your doctor what other things that you can do to help prevent falls. This information is not intended to replace advice given to you by your health care provider. Make sure you discuss any questions you have with your health care provider. Document Released: 12/29/2008 Document Revised: 08/10/2015 Document Reviewed: 04/08/2014 Elsevier Interactive Patient Education  2017 [employer].

## 2018-01-01 NOTE — Progress Notes (Signed)
Subjective:   [name] is a 57 y.o. female who presents for Medicare Annual (Subsequent) preventive examination.  Review of Systems:   Cardiac Risk Factors include: obesity (BMI >30kg/m2);hypertension     Objective:     Vitals: BP 139/80    Pulse 73    Resp 12    Ht 5\' 2"  (1.575 m)    Wt (!) 368 lb (166.9 kg)    SpO2 99%    BMI 67.31 kg/m   Body mass index is 67.31 kg/m.  Advanced Directives 08/14/2017 08/14/2017 07/13/2013 07/05/2013 03/03/2013 07/03/2011  Does Patient Have a Medical Advance Directive? No No Patient does not have advance directive;Patient would not like information Patient does not have advance directive;Patient would not like information Patient does not have advance directive;Patient would not like information Patient does not have advance directive  Would patient like information on creating a medical advance directive? No - Patient declined - - - - -  Pre-existing out of facility DNR order (yellow form or pink MOST form) - - No No No No  Some encounter information is confidential and restricted. Go to Review Flowsheets activity to see all data.    Tobacco Social History   Tobacco Use  Smoking Status Never Smoker  Smokeless Tobacco Never Used     Counseling given: Not Answered   Clinical Intake:  Pre-visit preparation completed: Yes  Pain : No/denies pain Pain Score: 0-No pain     BMI - recorded: 67.3 Nutritional Status: BMI > 30  Obese Nutritional Risks: Unintentional weight gain Diabetes: No  How often do you need to have someone help you when you read instructions, pamphlets, or other written materials from your doctor or pharmacy?: 1 - Never What is the last grade level you completed in school?: 15 years   Interpreter Needed?: No  Information entered by :: Hartford Poli LPN  Past Medical History:  Diagnosis Date   Chronic back pain    Degenerative joint disease    Left TKA in 07/2009-Dr. Landau   Depression 2001   doesn't  take any meds   Headache(784.0)    occasionally   Hypothyroidism    was on Levothyroxine-has been off x 4 months   Joint pain    Joint swelling    Morbid obesity (HCC)    Nocturia    Osteoarthritis of right knee 07/13/2013   Peripheral edema    to right leg;takes Furosemide occasionally and hasnt had one in a month   Syncope    with palpitations; evaluated by [facility] in 2008 with normal echo   Past Surgical History:  Procedure Laterality Date   ABDOMINAL HYSTERECTOMY  1995    no oophorectomy   CARPAL TUNNEL RELEASE  1991   Bilateral   CESAREAN SECTION     X2   CHOLECYSTECTOMY     CHOLECYSTECTOMY, LAPAROSCOPIC  2007   Dr. Lovell Sheehan   COLONOSCOPY N/A 03/03/2013   Procedure: COLONOSCOPY;  Surgeon: Malissa Hippo, MD;  Location: [facility];  Service: Endoscopy;  Laterality: N/A;  930   CRANIECTOMY SUBOCCIPITAL W/ CERVICAL LAMINECTOMY / CHIARI  2007   Repair of Arnold-Chiari malformation   JOINT REPLACEMENT Left 2011   KNEE ARTHROSCOPY  1995   Dr. Cleophas Dunker    THYROIDECTOMY, PARTIAL  2004   Left lobectomy - large benign nodule   TONSILLECTOMY     TOTAL KNEE ARTHROPLASTY  07/2009   Left knee , Dr. 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 strain: Not hard at all   Food insecurity:     Worry: Never true    Inability: Never true   Transportation needs:    Medical: No    Non-medical: No  Tobacco Use   Smoking status: Never Smoker   Smokeless tobacco: Never Used  Substance and Sexual Activity   Alcohol use: Yes    Alcohol/week: 0.0 standard drinks    Comment: once a month -glass or shot    Drug use: No   Sexual activity: Yes    Birth control/protection: Surgical  Lifestyle   Physical activity:    Days per week: 2 days    Minutes per session: 30 min   Stress: Only a little  Relationships   Social connections:    Talks on phone: More than three times a week    Gets together: Once a week    Attends religious service: 1 to 4 times per year    Active member of club or organization: Yes    Attends meetings of clubs or organizations: 1 to 4 times per year    Relationship status: Living with partner  Other Topics Concern   Not on file  Social History Narrative   Is in a new relationship     Outpatient Encounter Medications as of 01/01/2018  Medication Sig   acyclovir (ZOVIRAX) 400 MG tablet Take 1 tablet (400 mg total) by mouth 3 (three) times daily.   DULoxetine (CYMBALTA) 60 MG capsule Take 1 capsule (60 mg total) by mouth daily.   hydrochlorothiazide (HYDRODIURIL) 25 MG tablet Take 1 tablet (25 mg total) by mouth daily.   hydrOXYzine (ATARAX/VISTARIL) 50 MG tablet Take 1 tablet (50 mg total) by mouth every 6 (six) hours as needed for anxiety.   ibuprofen (ADVIL,MOTRIN) 800 MG tablet TAKE 1 TABLET BY MOUTH TWICE DAILY AS NEEDED FOR UNCONTROLLED KNEE PAIN   ibuprofen (ADVIL,MOTRIN) 800 MG tablet Take one tablet twice weekly as needed, for knee pain   temazepam (RESTORIL) 30 MG capsule Take 1 capsule (30 mg total) by mouth at bedtime as needed for sleep.   No facility-administered encounter medications on file as of 01/01/2018.     Activities of Daily Living In your present state of health, do you have any difficulty performing the following  activities: 01/01/2018 08/14/2017  Hearing? N -  Vision? N -  Difficulty concentrating or making decisions? Y -  Walking or climbing stairs? Y -  Comment - morbidly obese  Dressing or bathing? N -  Doing errands, shopping? N -  Preparing Food and eating ? N -  Using the Toilet? N -  In the past six months, have you accidently leaked urine? Y -  Do you have  problems with loss of bowel control? N -  Managing your Medications? N -  Managing your Finances? N -  Housekeeping or managing your Housekeeping? N -  Some encounter information is confidential and restricted. Go to Review Flowsheets activity to see all data.  Some recent data might be hidden    Patient Care Team: Kerri Perches, MD as PCP - General Rothbart, Gerrit Friends, MD (Cardiology) Teryl Lucy, MD (Orthopedic Surgery) Hershal Coria, PsyD (Psychology)    Assessment:   This is a routine wellness examination for [name].  Exercise Activities and Dietary recommendations Current Exercise Habits: Home exercise routine, Type of exercise: walking, Time (Minutes): 30, Frequency (Times/Week): 2, Weekly Exercise (Minutes/Week): 60, Intensity: Mild, Exercise limited by: cardiac condition(s);orthopedic condition(s)  Goals     DIET - DECREASE SODA OR JUICE INTAKE     Exercise 3x per week (30 min per time)     Patient Stated     Take more time for myself and be with friends more        Fall Risk Fall Risk  01/01/2018 12/18/2017 08/14/2017 06/12/2016 07/31/2015  Falls in the past year? Yes No No No No  Number falls in past yr: 1 - - - -  Injury with Fall? Yes - - - -  Risk for fall due to : Impaired mobility - - - -  Follow up Falls prevention discussed - - - -   Is the patient's home free of loose throw rugs in walkways, pet beds, electrical cords, etc?   yes      Grab bars in the bathroom? yes      Handrails on the stairs?   yes      Adequate lighting?   yes  Timed Get Up and Go performed: Patient able to  perform in 8 seconds with out assistance   Depression Screen PHQ 2/9 Scores 01/01/2018 12/18/2017 08/26/2017 08/26/2017  PHQ - 2 Score 1 1 3 4   PHQ- 9 Score - - 8 14  Some encounter information is confidential and restricted. Go to Review Flowsheets activity to see all data.     Cognitive Function     6CIT Screen 01/01/2018  What Year? 0 points  What month? 0 points  What time? 0 points  Count back from 20 0 points  Months in reverse 0 points  Repeat phrase 0 points  Total Score 0    Immunization History  Administered Date(s) Administered   Influenza Split 01/16/2012   Influenza Whole 02/23/2007, 12/07/2008   Influenza,inj,Quad PF,6+ Mos 12/07/2012, 02/15/2014, 11/28/2014, 01/09/2016, 04/07/2017, 12/18/2017   Pneumococcal Polysaccharide-23 07/31/2015   Td 10/23/2005    Qualifies for Shingles Vaccine?N/A  Screening Tests Health Maintenance  Topic Date Due   TETANUS/TDAP  10/24/2015   OPHTHALMOLOGY EXAM  10/29/2018 (Originally 07/01/2015)   URINE MICROALBUMIN  10/29/2018 (Originally 07/30/2016)   FOOT EXAM  11/05/2018 (Originally 07/30/2016)   HEMOGLOBIN A1C  02/25/2018   PAP SMEAR  04/05/2018   MAMMOGRAM  09/30/2019   COLONOSCOPY  03/04/2023   INFLUENZA VACCINE  Completed   PNEUMOCOCCAL POLYSACCHARIDE VACCINE AGE 99-64 HIGH RISK  Completed   Hepatitis C Screening  Completed   HIV Screening  Completed    Cancer Screenings: Lung: Low Dose CT Chest recommended if Age 77-80 years, 30 pack-year currently smoking OR have quit w/in 15years. Patient does not qualify. Breast:  Up to date on Mammogram? Yes   Up to date of Bone Density/Dexa? No Colorectal: up to date  Additional Screenings:  Hepatitis C Screening: complete      Plan:   Work on increasing physical activity, lose weight, take more time for herself, drink more water    I have personally reviewed and noted the following in the patients chart:    Medical and social history  Use of  alcohol, tobacco or illicit drugs   Current medications and supplements  Functional ability and status  Nutritional status  Physical activity  Advanced directives  List of other physicians  Hospitalizations, surgeries, and ER visits in previous 12 months  Vitals  Screenings to include cognitive, depression, and falls  Referrals and appointments  In addition, I have reviewed and discussed with patient certain preventive protocols, quality metrics, and best practice recommendations. A written personalized care plan for preventive services as well as general preventive health recommendations were provided to patient.     [name]  01/01/2018

## 2018-01-01 NOTE — Progress Notes (Signed)
..              THERAPIST PROGRESS NOTE  Session Time:  Thursday 01/01/2018 2:12 PM -  2:56 PM                             Participation Level: Active  Behavioral Response: CasualAlert/anxious/tearful at times  Type of Therapy: Individual Therapy  Treatment Goals addressed:  appropriately grieve loss in order to normalize mood and to return to previously adaptive level of functioning  Interventions: Supportive  Summary: [name] is a 57 y.o. female who presents with a history of recurrent depression. She has had two psychiatric hospitalizations. She is a returning patient to this practice and clinician. She ws seen for medication management and outpatient psychotherapy. She is resuming services as symptoms of depression have worsened in recent months. Her 73 yo mother died on 04/09/17. Patient was her caretaker.  father died 3 years ago. Patient reports additional stress related to assisting her adult children with their financial and parenting responsibilities. Patient's symptoms include depressed mood, poor concentration, tearfulness, irritability, sleep difficulty, fatigue, feelings of hopelessness and worthlessness anxiety, and excessive worrying.  Patient last was seen about 2 weeks ago. She reports continued stress regarding having a crowded household. She continues to set and maintain limits with her family and has had a positive response. She reports enjoying recent weekend trip with boyfriend and her daughter. She reports continued sadness about mother but trying to face her pain. She talks about mother in session today without becoming overwhelmed. She still reports still feeling some guilt and also expresses some anger with her siblings aren't responding to mother's death the way she is. also is scheduling more time for self including having a long time away from home. She also is looking forward to going on a weekend trip with boyfriend next weekend.   Suicidal/Homicidal:  Nowithout intent/plan  Therapist Response:   Praised and reinforced patient's efforts to set and maintain limits with her familyfacilitated expression of thoughts and feelings to address unresolved grief and loss issues, did overview of the five stages of grief to normalize feelings and to provide framework for patient to understand what she is experiencing, assigned patient to write how she has been affected by the five stages, bring to next sessionPlan: Return again in 2 weeks  Diagnosis: Axis I: major depressive disorder, recurrent, moderate    Axis II: Deferred    Oaklen Thiam, LCSW 01/01/2018

## 2018-01-06 NOTE — Progress Notes (Signed)
BH MD/PA/NP OP Progress Note  01/08/2018 8:35 AM [name]  MRN:  [id number] Chief Complaint:  Chief Complaint    Follow-up; Depression     HPI:  Patient presents for follow-up appointment for depression.  She states that she has been feeling better since the last appointment.  Although there were a few days of depression for months, she was not bothered by it so much.  Although she still misses her mother, it is not as intense as it used to be.  She reports fair relationship with her children.  Although she feels frustrated that there are 6 people in the house, she has been able to manage things well.  She has not had any issues at work.  She sleeps better.  She has good energy and motivation.  She denies SI.  She reports occasional anxiety.  She denies panic attacks.  She hopes to switch duloxetine to other antidepressant as it is expensive to buy duloxetine.     Visit Diagnosis:    ICD-10-CM   1. Major depressive disorder, recurrent episode, moderate (HCC) F33.1    Past Psychiatric History: Please see initial evaluation for full details. I have reviewed the history. No updates at this time.     Past Medical History:  Past Medical History:  Diagnosis Date  . Chronic back pain   . Degenerative joint disease    Left TKA in 07/2009-Dr. Landau  . Depression 2001   doesn't take any meds  . Headache(784.0)    occasionally  . Hypothyroidism    was on Levothyroxine-has been off x 4 months  . Joint pain   . Joint swelling   . Morbid obesity (HCC)   . Nocturia   . Osteoarthritis of right knee 07/13/2013  . Peripheral edema    to right leg;takes Furosemide occasionally and hasnt had one in a month  . Syncope    with palpitations; evaluated by [facility] in 2008 with normal echo    Past Surgical History:  Procedure Laterality Date  . ABDOMINAL HYSTERECTOMY  1995    no oophorectomy  . CARPAL TUNNEL RELEASE  1991   Bilateral  . CESAREAN SECTION      X2  . CHOLECYSTECTOMY    . CHOLECYSTECTOMY, LAPAROSCOPIC  2007   Dr. Lovell Sheehan  . COLONOSCOPY N/A 03/03/2013   Procedure: COLONOSCOPY;  Surgeon: Malissa Hippo, MD;  Location: [facility];  Service: Endoscopy;  Laterality: N/A;  930  . CRANIECTOMY SUBOCCIPITAL W/ CERVICAL LAMINECTOMY / CHIARI  2007   Repair of Arnold-Chiari malformation  . JOINT REPLACEMENT Left 2011  . KNEE ARTHROSCOPY  1995   Dr. Cleophas Dunker   . THYROIDECTOMY, PARTIAL  2004   Left lobectomy - large benign nodule  . TONSILLECTOMY    . TOTAL KNEE ARTHROPLASTY  07/2009   Left knee , Dr. Dion Saucier   . TOTAL KNEE ARTHROPLASTY Right 07/13/2013   Procedure: TOTAL KNEE ARTHROPLASTY;  Surgeon: Eulas Post, MD;  Location: [facility] OR;  Service: Orthopedics;  Laterality: Right;    Family Psychiatric History: Please see initial evaluation for full details. I have reviewed the history. No updates at this time.     Family History:  Family History  Problem Relation Age of Onset  . Diabetes Mother   . Depression Mother   . Diabetes Father        emphsema   . Hyperlipidemia Father   . Depression Other   . Colon cancer Neg Hx  Social History:  Social History   Socioeconomic History  . Marital status: Significant Other    Spouse name: Not on file  . Number of children: 3  . Years of education: 45  . Highest education level: 12th grade  Occupational History  . Occupation: Employed seeking disabilty 2012  Social Needs  . Financial resource strain: Not hard at all  . Food insecurity:    Worry: Never true    Inability: Never true  . Transportation needs:    Medical: No    Non-medical: No  Tobacco Use  . Smoking status: Never Smoker  . Smokeless tobacco: Never Used  Substance and Sexual Activity  . Alcohol use: Yes    Alcohol/week: 0.0 standard drinks    Comment: once a month -glass or shot   . Drug use: No  . Sexual activity: Yes    Birth control/protection: Surgical  Lifestyle  . Physical activity:    Days  per week: 2 days    Minutes per session: 30 min  . Stress: Only a little  Relationships  . Social connections:    Talks on phone: More than three times a week    Gets together: Once a week    Attends religious service: 1 to 4 times per year    Active member of club or organization: Yes    Attends meetings of clubs or organizations: 1 to 4 times per year    Relationship status: Living with partner  Other Topics Concern  . Not on file  Social History Narrative   Is in a new relationship     Allergies:  Allergies  Allergen Reactions  . Aleve [Naproxen Sodium] Shortness Of Breath  . Penicillins Swelling    .Has patient had a PCN reaction causing immediate rash, facial/tongue/throat swelling, SOB or lightheadedness with hypotension: Yes Has patient had a PCN reaction causing severe rash involving mucus membranes or skin necrosis: No Has patient had a PCN reaction that required hospitalization: No Has patient had a PCN reaction occurring within the last 10 years: No If all of the above answers are "NO", then may proceed with Cephalosporin use.   . Adhesive [Tape]     rash  . Phentermine   . Latex Rash    Metabolic Disorder Labs: Lab Results  Component Value Date   HGBA1C 6.1 (H) 08/26/2017   MPG 128 08/26/2017   MPG 126 04/11/2017   No results found for: PROLACTIN Lab Results  Component Value Date   CHOL 124 04/11/2017   TRIG 52 04/11/2017   HDL 51 04/11/2017   CHOLHDL 2.4 04/11/2017   VLDL 12 10/02/2016   LDLCALC 60 04/11/2017   LDLCALC 92 10/02/2016   Lab Results  Component Value Date   TSH 2.34 04/11/2017   TSH 5.24 (H) 10/02/2016    Therapeutic Level Labs: No results found for: LITHIUM No results found for: VALPROATE No components found for:  CBMZ  Current Medications: Current Outpatient Medications  Medication Sig Dispense Refill  . acyclovir (ZOVIRAX) 400 MG tablet Take 1 tablet (400 mg total) by mouth 3 (three) times daily. 30 tablet 0  .  hydrochlorothiazide (HYDRODIURIL) 25 MG tablet Take 1 tablet (25 mg total) by mouth daily. 90 tablet 3  . hydrOXYzine (ATARAX/VISTARIL) 50 MG tablet Take 1 tablet (50 mg total) by mouth every 6 (six) hours as needed for anxiety. 30 tablet 0  . ibuprofen (ADVIL,MOTRIN) 800 MG tablet TAKE 1 TABLET BY MOUTH TWICE DAILY AS NEEDED FOR UNCONTROLLED  KNEE PAIN 20 tablet 0  . ibuprofen (ADVIL,MOTRIN) 800 MG tablet Take one tablet twice weekly as needed, for knee pain 30 tablet 1  . temazepam (RESTORIL) 30 MG capsule Take 1 capsule (30 mg total) by mouth at bedtime as needed for sleep. 30 capsule 1  . sertraline (ZOLOFT) 100 MG tablet 50 mg daily for one week, then 100 mg daily for one week, then 150 mg daily 45 tablet 2   No current facility-administered medications for this visit.      Musculoskeletal: Strength & Muscle Tone: within normal limits Gait & Station: normal Patient leans: N/A  Psychiatric Specialty Exam: Review of Systems  Psychiatric/Behavioral: Positive for depression. Negative for hallucinations, memory loss, substance abuse and suicidal ideas. The patient is nervous/anxious. The patient does not have insomnia.   All other systems reviewed and are negative.   Blood pressure (!) 165/80, pulse 65, height 5\' 2"  (1.575 m), weight (!) 366 lb (166 kg), SpO2 98 %.Body mass index is 66.94 kg/m.  General Appearance: Fairly Groomed  Eye Contact:  Good  Speech:  Clear and Coherent  Volume:  Normal  Mood:  "good"  Affect:  Appropriate, Congruent and Full Range  Thought Process:  Coherent  Orientation:  Full (Time, Place, and Person)  Thought Content: Logical   Suicidal Thoughts:  No  Homicidal Thoughts:  No  Memory:  Immediate;   Good  Judgement:  Good  Insight:  Good  Psychomotor Activity:  Normal  Concentration:  Concentration: Good and Attention Span: Good  Recall:  Good  Fund of Knowledge: Good  Language: Good  Akathisia:  No  Handed:  Right  AIMS (if indicated): not done   Assets:  Communication Skills Desire for Improvement  ADL's:  Intact  Cognition: WNL  Sleep:  Good   Screenings: AIMS     [facility]  AIMS Total Score  0    AUDIT     Ad[facility]  Alcohol Use Disorder Identification Test Final Score (AUDIT)  0    GAD-7     Virtual BH Phone Follow Up from 08/26/2017 in [facility] Virtual BH Visit from 08/14/2017 in [facility]  Total GAD-7 Score  7  14    PHQ2-9     Clinical Support from 01/01/2018 in [facility] Most recent reading at 01/01/2018  3:25 PM Office Visit from 12/18/2017 in [facility] Most recent reading at 12/18/2017  9:24 AM Counselor from [facility]28[facility]2017 [facility] Most recent reading at 10/28/2017  4:41 PM Virtual BH Phone Follow Up from 08/26/2017 in [facility] Most recent reading at 08/26/2017  8:23 PM Office Visit from 08/26/2017 in [facility] Most recent reading at 08/26/2017 10:20 AM  PHQ-2 Total Score  1  1  3  3  4   PHQ-9 Total Score  -  -  6  8  14        Assessment and Plan:  Eshani ALANNIS HSIA is a 57 y.o. year old female with a history of depression, hypertension, obesity , who presents for follow up appointment for Major depressive disorder, recurrent episode, moderate (HCC)  Patient was admitted to [facility] in 08/2017 for worsening depression with SI with no plan/intent in the context of loss of her family.  # MDD, moderate, recurrent without psychotic features Patient reports significant improvement in depressive symptoms after  up titration of duloxetine.  However, she reports concern of financial strain.  Will switch from duloxetine to sertraline.  Discussed potential withdrawal symptoms from duloxetine.  Discussed potential side effect of GI symptoms and  drowsiness from sertraline.  Discussed behavioral activation  Plan 1. Discontinue duloxetine 2. Start sertraline 50 mg daily for one week, then 100 mg daily for one week, then 150 mg daily  3. Continue hydroxyzine 50 mg daily prn for anxiety  4. Return to clinic in three months for 15 mins - on temazepam (she has not taken it for a while)   The patient demonstrates the following risk factors for suicide: Chronic risk factors for suicide include:psychiatric disorder ofdepressionand completed suicide in a family member. Acute risk factorsfor suicide include: loss (financial, interpersonal, professional) and recent discharge from inpatient psychiatry. Protective factorsfor this patient include: positive social support, responsibility to others (children, family), coping skills and hope for the future. Considering these factors, the overall suicide risk at this point appears to below. Patientisappropriate for outpatient follow up.  Neysa Hotter, MD 01/08/2018, 8:35 AM

## 2018-01-07 ENCOUNTER — Ambulatory Visit: Payer: Medicare HMO

## 2018-01-07 NOTE — Telephone Encounter (Signed)
Everything completed and mailed to patient

## 2018-01-08 ENCOUNTER — Telehealth: Payer: Self-pay | Admitting: Cardiology

## 2018-01-08 ENCOUNTER — Ambulatory Visit (INDEPENDENT_AMBULATORY_CARE_PROVIDER_SITE_OTHER): Payer: Medicare HMO | Admitting: Psychiatry

## 2018-01-08 ENCOUNTER — Encounter (HOSPITAL_COMMUNITY): Payer: Self-pay | Admitting: Psychiatry

## 2018-01-08 VITALS — BP 165/80 | HR 65 | Ht 62.0 in | Wt 366.0 lb

## 2018-01-08 DIAGNOSIS — R69 Illness, unspecified: Secondary | ICD-10-CM | POA: Diagnosis not present

## 2018-01-08 DIAGNOSIS — F419 Anxiety disorder, unspecified: Secondary | ICD-10-CM

## 2018-01-08 DIAGNOSIS — F331 Major depressive disorder, recurrent, moderate: Secondary | ICD-10-CM | POA: Diagnosis not present

## 2018-01-08 MED ORDER — SERTRALINE HCL 100 MG PO TABS: ORAL_TABLET | ORAL | 2 refills | Status: DC

## 2018-01-08 MED ORDER — DILTIAZEM HCL 30 MG PO TABS: 30.0000 mg | ORAL_TABLET | Freq: Two times a day (BID) | ORAL | 3 refills | Status: DC

## 2018-01-08 NOTE — Telephone Encounter (Signed)
Pt voiced understanding - Medication sent to pharmacy - pt will update [place] on symptoms next week

## 2018-01-08 NOTE — Telephone Encounter (Signed)
Pt c/o dizziness/SOB off and on but becoming more frequent in the last 2 months - says she feels heart fluttering when laying down at night at times but not everyday - says BP has been running 130s/70s doesn't know what HR has been - still taking hctz 25 mg daily - denies any swelling - no recent hospital or ED - has upcoming appt in December and told pt to continue to monitor symptoms and would forward to Dr Wyline Mood for any further recommendations

## 2018-01-08 NOTE — Patient Instructions (Addendum)
1. Discontinue duloxetine 2. Start sertraline 50 mg daily for one week, then 100 mg daily for one week, then 150 mg daily  3. Continue hydroxyzine 50 mg daily prn for anxiety  4. Return to clinic in three months for 15 mins

## 2018-01-08 NOTE — Telephone Encounter (Signed)
Patient c/o Palpitations:    1) How long have you had palpitations/irregular HR/ Afib? Are you having the symptoms now?  approximately 2 months   2) Are you currently experiencing lightheadedness, SOB or CP? Yes lightheadedness and SOB at times (comes and goes)   3) Do you have a history of afib (atrial fibrillation) or irregular heart rhythm?   Have you checked your BP or HR? (document readings if available):  No   4) Are you experiencing any other symptoms? No    Patient has upcoming appointment for December .

## 2018-01-08 NOTE — Telephone Encounter (Signed)
Pt says when she was in rehab for depression they stopped metoprolol for interactions with medications she was taking at that time - she didn't know what medication she was given in rehab - she currently on take restoril 30mg  as needed, Zoloft 100 mg daily, ibuprofen 800 mg prn, hydroxyzine 50 mg, and hctz 25 mg daily

## 2018-01-08 NOTE — Telephone Encounter (Signed)
I last saw her 2 years ago and started her on lopressor(metoprolol) for her symptoms, I don't see that as being still on her medication list. Is she still taking? If not did one of her docs stop, and if so does she know why?   Dominga Ferry MD

## 2018-01-08 NOTE — Telephone Encounter (Signed)
Can we start diltiazem 30mg  bid, this should help with her palpitations. Update early next week after being on the medication over the weekend   [place] MD

## 2018-01-08 NOTE — Telephone Encounter (Signed)
LM to return call.

## 2018-01-16 ENCOUNTER — Ambulatory Visit (INDEPENDENT_AMBULATORY_CARE_PROVIDER_SITE_OTHER): Payer: Medicare HMO | Admitting: Psychiatry

## 2018-01-16 ENCOUNTER — Encounter

## 2018-01-16 ENCOUNTER — Encounter (HOSPITAL_COMMUNITY): Payer: Self-pay | Admitting: Psychiatry

## 2018-01-16 DIAGNOSIS — F331 Major depressive disorder, recurrent, moderate: Secondary | ICD-10-CM | POA: Diagnosis not present

## 2018-01-16 DIAGNOSIS — I10 Essential (primary) hypertension: Secondary | ICD-10-CM | POA: Diagnosis not present

## 2018-01-16 DIAGNOSIS — R69 Illness, unspecified: Secondary | ICD-10-CM | POA: Diagnosis not present

## 2018-01-16 DIAGNOSIS — Z713 Dietary counseling and surveillance: Secondary | ICD-10-CM | POA: Diagnosis not present

## 2018-01-16 NOTE — Progress Notes (Signed)
..              THERAPIST PROGRESS NOTE  Session Time:  Friday 01/16/2018 10:22 AM - 10:58 AM                             Participation Level: Active  Behavioral Response: CasualAlert/anxious/pleasant, cheerful  Type of Therapy: Individual Therapy  Treatment Goals addressed:  appropriately grieve loss in order to normalize mood and to return to previously adaptive level of functioning  Interventions: Supportive      Summary: [name] is a 57 y.o. female who presents with a history of recurrent depression. She has had two psychiatric hospitalizations. She is a returning patient to this practice and clinician. She ws seen for medication management and outpatient psychotherapy. She is resuming services as symptoms of depression have worsened in recent months. Her 61 yo mother died on 04/10/2017. Patient was her caretaker.  father died 3 years ago. Patient reports additional stress related to assisting her adult children with their financial and parenting responsibilities. Patient's symptoms include depressed mood, poor concentration, tearfulness, irritability, sleep difficulty, fatigue, feelings of hopelessness and worthlessness anxiety, and excessive worrying.  Patient last was seen about 2 weeks ago. She reports continued stress regarding having a crowded household but managing well. She continues to set and maintain limits with her family and has had a positive response. She says she is continuing to take more time for self and  reports much improved mood. She She completed homework regarding five stages of grief. She reports this caused her to really think about her experience and her feelings as well as deal with her pain. She not only focused on the death of her mother while completing assignment but also used assignment to process feelings about the death of her father. She reports coping much better.   Suicidal/Homicidal: Nowithout intent/plan  Therapist Response:   Praised and  reinforced patient's efforts to set and maintain limits with her family, praised patient's efforts in completing homework and facing her pain, discussed effects of doing homework, facilitated expression of thoughts and feelings related to patient's grief experience during the stages of grief, validated feelings, discussed stages as not being independent of each other or being completed in a sequence but rather going back and forth between stages   Plan: Return again in 2 weeks  Diagnosis: Axis I: major depressive disorder, recurrent, moderate    Axis II: Deferred    Donnette Macmullen, LCSW 01/16/2018

## 2018-01-21 ENCOUNTER — Telehealth: Payer: Self-pay | Admitting: Cardiology

## 2018-01-21 NOTE — Telephone Encounter (Signed)
Patient notified and verbalized understanding. 

## 2018-01-21 NOTE — Telephone Encounter (Signed)
Patient called to update symptoms  with taking the new medication . She also is requesting to know if she can take another type of medication.

## 2018-01-21 NOTE — Telephone Encounter (Signed)
States that the Diltiazem 30mg  twice a day is doing good, tolerating well.    States that she is going to [facility] (weight loss) in [place] - wants to start her on Phentermine 15mg  daily - stated she has taken this before in the past & tolerated well.  Needs the okay form physician before starting.  Please fax reply to:  [phone].

## 2018-01-21 NOTE — Telephone Encounter (Signed)
Phentermine is a stimulant and likely will cause her heart fluttering to worsen. I would avoid at this time   Dominga Ferry MD

## 2018-01-25 ENCOUNTER — Emergency Department (HOSPITAL_COMMUNITY)
Admission: EM | Admit: 2018-01-25 | Discharge: 2018-01-25 | Disposition: A | Discharge status: Home / Self Care | Payer: Medicare HMO | Attending: Emergency Medicine | Admitting: Emergency Medicine

## 2018-01-25 ENCOUNTER — Encounter (HOSPITAL_COMMUNITY): Payer: Self-pay | Admitting: Emergency Medicine

## 2018-01-25 DIAGNOSIS — R21 Rash and other nonspecific skin eruption: Secondary | ICD-10-CM | POA: Diagnosis not present

## 2018-01-25 DIAGNOSIS — Z96653 Presence of artificial knee joint, bilateral: Secondary | ICD-10-CM | POA: Diagnosis not present

## 2018-01-25 DIAGNOSIS — Z79899 Other long term (current) drug therapy: Secondary | ICD-10-CM | POA: Diagnosis not present

## 2018-01-25 DIAGNOSIS — I110 Hypertensive heart disease with heart failure: Secondary | ICD-10-CM | POA: Insufficient documentation

## 2018-01-25 DIAGNOSIS — Z91040 Latex allergy status: Secondary | ICD-10-CM | POA: Diagnosis not present

## 2018-01-25 DIAGNOSIS — E039 Hypothyroidism, unspecified: Secondary | ICD-10-CM | POA: Insufficient documentation

## 2018-01-25 DIAGNOSIS — I5032 Chronic diastolic (congestive) heart failure: Secondary | ICD-10-CM | POA: Insufficient documentation

## 2018-01-25 MED ORDER — DIPHENHYDRAMINE HCL 25 MG PO CAPS
25.0000 mg | ORAL_CAPSULE | Freq: Once | ORAL | Status: AC
  Administered 2018-01-25: 25 mg via ORAL
  Filled 2018-01-25: qty 1

## 2018-01-25 MED ORDER — PREDNISONE 10 MG PO TABS: ORAL_TABLET | ORAL | 0 refills | Status: DC

## 2018-01-25 MED ORDER — DEXAMETHASONE SODIUM PHOSPHATE 10 MG/ML IJ SOLN
10.0000 mg | Freq: Once | INTRAMUSCULAR | Status: AC
  Administered 2018-01-25: 10 mg via INTRAMUSCULAR
  Filled 2018-01-25: qty 1

## 2018-01-25 NOTE — Discharge Instructions (Addendum)
Take your next dose of the prednisone tomorrow as you have received an injection of this medication today.  As discussed you may use Benadryl for itch relief, adding Claritin or Zyrtec during the daytime if you need a medication that is less sedating in place of the Benadryl.  [place]bond anti-itch cream is also very soothing for itchy rashes.  Follow-up with your doctor for recheck in 1 week if this treatment is not improving your rash.

## 2018-01-25 NOTE — ED Provider Notes (Signed)
[facility] EMERGENCY DEPARTMENT Provider Note   CSN: [id number] Arrival date & time: 01/25/18  1441     History   Chief Complaint Chief Complaint  Patient presents with  . Rash    HPI [name] is a 57 y.o. female presenting with an approximate 6-week history of generalized pruritus which she has noticed seems worse in a hot environment, then 4 days ago she developed a rash which started on her right forearm and now has spread to her bilateral arms with a few scattered lesions also on her abdomen and chest.  These areas are pruritic as well.  She denies pain at the sites and there is been no drainage as well.  She has had no shortness of breath, wheezing, mouth throat or facial swelling.  She has no recognized new medications, soaps, lotions laundry detergent or other potential triggers.  She has used Benadryl with minimal relief of itching is also used some hydrocortisone cream which does not seem to improve the symptoms.  No other household members have similar symptoms.  The history is provided by the patient.    Past Medical History:  Diagnosis Date  . Chronic back pain   . Degenerative joint disease    Left TKA in 07/2009-Dr. Landau  . Depression 2001   doesn't take any meds  . Headache(784.0)    occasionally  . Hypothyroidism    was on Levothyroxine-has been off x 4 months  . Joint pain   . Joint swelling   . Morbid obesity (HCC)   . Nocturia   . Osteoarthritis of right knee 07/13/2013  . Peripheral edema    to right leg;takes Furosemide occasionally and hasnt had one in a month  . Syncope    with palpitations; evaluated by [facility] in 2008 with normal echo    Patient Active Problem List   Diagnosis Date Noted  . Major depressive disorder, recurrent episode, moderate (HCC) 08/29/2017  . Back pain with sciatica 11/13/2015  . Prediabetes 07/31/2015  . Knee osteoarthritis 07/13/2013  . Chronic diastolic heart failure (HCC)  23/55/7322  . Snoring disorder 01/03/2013  . Thoracic spine pain 12/29/2012  . Vitamin D deficiency 12/08/2012  . GERD (gastroesophageal reflux disease) 07/15/2012  . Exercise intolerance 11/26/2011  . Essential hypertension 11/26/2011  . 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 Anemia 07/03/2011  . Morbid obesity (HCC) 12/03/2007    Past Surgical History:  Procedure Laterality Date  . ABDOMINAL HYSTERECTOMY  1995    no oophorectomy  . CARPAL TUNNEL RELEASE  1991   Bilateral  . CESAREAN SECTION     X2  . CHOLECYSTECTOMY    . CHOLECYSTECTOMY, LAPAROSCOPIC  2007   Dr. Lovell Sheehan  . COLONOSCOPY N/A 03/03/2013   Procedure: COLONOSCOPY;  Surgeon: Malissa Hippo, MD;  Location: [facility] SU[facility];  Service: Endoscopy;  Laterality: N/A;  930  . CRANIECTOMY SUBOCCIPITAL W/ CERVICAL LAMINECTOMY / CHIARI  2007   Repair of Arnold-Chiari malformation  . JOINT REPLACEMENT Left 2011  . KNEE ARTHROSCOPY  1995   Dr. Cleophas Dunker   . THYROIDECTOMY, PARTIAL  2004   Left lobectomy - large benign nodule  . TONSILLECTOMY    . TOTAL KNEE ARTHROPLASTY  07/2009   Left knee , Dr. Dion Saucier   . TOTAL KNEE ARTHROPLASTY Right 07/13/2013   Procedure: TOTAL KNEE ARTHROPLASTY;  Surgeon: Eulas Post, MD;  Location: [facility] OR;  Service: Orthopedics;  Laterality: Right;     OB History   None  Home Medications    Prior to Admission medications   Medication Sig Start Date End Date Taking? Authorizing Provider  acyclovir (ZOVIRAX) 400 MG tablet Take 1 tablet (400 mg total) by mouth 3 (three) times daily. 12/18/17 12/28/18  Kerri Perches, MD  diltiazem (CARDIZEM) 30 MG tablet Take 1 tablet (30 mg total) by mouth 2 (two) times daily. 01/08/18   Antoine Poche, MD  hydrochlorothiazide (HYDRODIURIL) 25 MG tablet Take 1 tablet (25 mg total) by mouth daily. 04/07/17   Kerri Perches, MD  hydrOXYzine (ATARAX/VISTARIL) 50 MG tablet Take 1 tablet (50 mg total) by mouth every 6 (six) hours as needed for  anxiety. 08/17/17   Money, Gerlene Burdock, FNP  ibuprofen (ADVIL,MOTRIN) 800 MG tablet TAKE 1 TABLET BY MOUTH TWICE DAILY AS NEEDED FOR UNCONTROLLED KNEE PAIN 12/18/17   Kerri Perches, MD  ibuprofen (ADVIL,MOTRIN) 800 MG tablet Take one tablet twice weekly as needed, for knee pain 12/18/17   Kerri Perches, MD  predniSONE (DELTASONE) 10 MG tablet Take 5 tabs daily for 2 days,  4 tabs daily for 2 days,  3 tabs daily for 2 days,  2 tabs daily for 2 days,  Then 1 tab daily for 2 days. 01/25/18   Burgess Amor, PA-C  sertraline (ZOLOFT) 100 MG tablet 50 mg daily for one week, then 100 mg daily for one week, then 150 mg daily 01/08/18   Neysa Hotter, MD  temazepam (RESTORIL) 30 MG capsule Take 1 capsule (30 mg total) by mouth at bedtime as needed for sleep. 10/21/17   Neysa Hotter, MD    Family History Family History  Problem Relation Age of Onset  . Diabetes Mother   . Depression Mother   . Diabetes Father        emphsema   . Hyperlipidemia Father   . Depression Other   . Colon cancer Neg Hx     Social History Social History   Tobacco Use  . Smoking status: Never Smoker  . Smokeless tobacco: Never Used  Substance Use Topics  . Alcohol use: Yes    Alcohol/week: 0.0 standard drinks    Comment: rarely  . Drug use: No     Allergies   Aleve [naproxen sodium]; Penicillins; Adhesive [tape]; Phentermine; and Latex   Review of Systems Review of Systems  Constitutional: Negative for chills and fever.  Respiratory: Negative for shortness of breath and wheezing.   Skin: Positive for rash.  Neurological: Negative for numbness.     Physical Exam Updated Vital Signs BP (!) 149/88 (BP Location: Right Arm)   Pulse 68   Temp 98.3 F (36.8 C) (Oral)   Resp 16   Ht 5\' 2"  (1.575 m)   Wt (!) 157.9 kg   SpO2 98%   BMI 63.65 kg/m   Physical Exam  Constitutional: She appears well-developed and well-nourished. No distress.  HENT:  Head: Normocephalic.  Neck: Neck supple.    Cardiovascular: Normal rate.  Pulmonary/Chest: Effort normal. She has no wheezes.  Musculoskeletal: Normal range of motion. She exhibits no edema.  Skin: Rash noted. No purpura noted. Rash is papular. Rash is not nodular, not pustular, not vesicular and not urticarial. No erythema.  Scattered isolated papular skin colored lesions on the volar and dorsal arms with a few lesions on her abdomen.  There is no surrounding erythema, no drainage, no pustules.  Lesions are soft without induration.  No tunneling present.     ED Treatments / Results  Labs (  all labs ordered are listed, but only abnormal results are displayed) Labs Reviewed - No data to display  EKG None  Radiology No results found.  Procedures Procedures (including critical care time)  Medications Ordered in ED Medications  dexamethasone (DECADRON) injection 10 mg (10 mg Intramuscular Given 01/25/18 1714)  diphenhydrAMINE (BENADRYL) capsule 25 mg (25 mg Oral Given 01/25/18 1714)     Initial Impression / Assessment and Plan / ED Course  I have reviewed the triage vital signs and the nursing notes.  Pertinent labs & imaging results that were available during my care of the patient were reviewed by me and considered in my medical decision making (see chart for details).     Suspect patient is having some sort of a sensitivity or histamine reaction/contact dermatitis.  No other household members have similar symptoms so I doubt this is an infectious rash such as scabies.  She was placed on a prednisone taper, also advised to continue her Benadryl for itch relief.  Plan follow-up with her PCP for recheck if symptoms are not improving over the next several days with this treatment.   Final Clinical Impressions(s) / ED Diagnoses   Final diagnoses:  Rash    ED Discharge Orders         Ordered    predniSONE (DELTASONE) 10 MG tablet     01/25/18 1710           Burgess Amor, PA-C 01/25/18 1819    Vanetta Mulders, MD 01/25/18 2005

## 2018-01-25 NOTE — ED Triage Notes (Signed)
Patient c/o rash to arms, legs, chest, and neck. Per patient itching started 6 weeks ago but rash appeared 4 days ago. Denies any new medications, foods, lotions, or detergents. Denies any difficult swallowing or breathing. Patient using hydrocortisone and benadryl with no relief-last dose of benadryl last night.

## 2018-01-27 ENCOUNTER — Ambulatory Visit (HOSPITAL_COMMUNITY): Payer: Self-pay | Admitting: Psychiatry

## 2018-01-30 DIAGNOSIS — I10 Essential (primary) hypertension: Secondary | ICD-10-CM | POA: Diagnosis not present

## 2018-01-30 DIAGNOSIS — Z713 Dietary counseling and surveillance: Secondary | ICD-10-CM | POA: Diagnosis not present

## 2018-02-02 ENCOUNTER — Ambulatory Visit (INDEPENDENT_AMBULATORY_CARE_PROVIDER_SITE_OTHER): Payer: Medicare HMO | Admitting: Family Medicine

## 2018-02-02 ENCOUNTER — Encounter: Payer: Self-pay | Admitting: Family Medicine

## 2018-02-02 VITALS — BP 120/84 | HR 83 | Resp 16 | Ht 62.0 in | Wt 345.0 lb

## 2018-02-02 DIAGNOSIS — F331 Major depressive disorder, recurrent, moderate: Secondary | ICD-10-CM | POA: Diagnosis not present

## 2018-02-02 DIAGNOSIS — L239 Allergic contact dermatitis, unspecified cause: Secondary | ICD-10-CM | POA: Insufficient documentation

## 2018-02-02 DIAGNOSIS — R69 Illness, unspecified: Secondary | ICD-10-CM | POA: Diagnosis not present

## 2018-02-02 MED ORDER — HYDROXYZINE HCL 50 MG PO TABS: ORAL_TABLET | ORAL | 0 refills | Status: DC

## 2018-02-02 MED ORDER — PREDNISONE 5 MG (21) PO TBPK: 5.0000 mg | ORAL_TABLET | ORAL | 0 refills | Status: DC

## 2018-02-02 NOTE — Assessment & Plan Note (Signed)
Improved and she is applauded on this Patient re-educated about  the importance of commitment to a  minimum of 150 minutes of exercise per week.  The importance of healthy food choices with portion control discussed. Encouraged to start a food diary, count calories and to consider  joining a support group. Sample diet sheets offered. Goals set by the patient for the next several months.   Weight /BMI 02/02/2018 01/25/2018 01/01/2018  WEIGHT 345 lb 348 lb 368 lb  HEIGHT 5\' 2"  5\' 2"  5\' 2"   BMI 63.1 kg/m2 63.65 kg/m2 67.31 kg/m2  Some encounter information is confidential and restricted. Go to Review Flowsheets activity to see all data.

## 2018-02-02 NOTE — Assessment & Plan Note (Signed)
Managed by psychiatry and doing well 

## 2018-02-02 NOTE — Patient Instructions (Signed)
Keep physical exam as scheduled , call if you need me before  Prednisone and hydroxyzine are sent in   Congrats on weight loss, keep it up!   Call if you need referral to allergist  Thank you  for choosing [facility]. We consider it a privelige to serve you.  Delivering excellent health care in a caring and  compassionate way is our goal.  Partnering with you,  so that together we can achieve this goal is our strategy.

## 2018-02-02 NOTE — Progress Notes (Signed)
   [name]     MRN: [id number]      DOB: 04-20-1960   HPI Ms. Duling is here for follow up of Recent ED visit when she presented with a generalized rash, red, on both upper  Extremities, the only new drug, food or toiletry was a vit B12 injection which she got a the weight loss clinic Denied having wheezing , SOB or lip swelling. Rash is still "popping up" and almost out of her prednisone Wants to hold on evaluation by allergist at thios time ROS See HPI ' Denies recent fever or chills. Denies sinus pressure, nasal congestion, ear pain or sore throat. Denies chest congestion, productive cough or wheezing. Denies chest pains, palpitations and leg swelling Denies abdominal pain, nausea, vomiting,diarrhea or constipation.   Denies dysuria, frequency, hesitancy or incontinence. Denies joint pain, swelling and limitation in mobility. Denies headaches, seizures, numbness, or tingling. Denies depression, anxiety or insomnia.   PE  BP 120/84   Pulse 83   Resp 16   Ht 5\' 2"  (1.575 m)   Wt (!) 345 lb (156.5 kg)   SpO2 95%   BMI 63.10 kg/m   Patient alert and oriented and in no cardiopulmonary distress.  HEENT: No facial asymmetry, EOMI,   oropharynx pink and moist.  Neck supple no JVD, no mass.  Chest: Clear to auscultation bilaterally.  CVS: S1, S2 no murmurs, no S3.Regular rate.  ABD: Soft non tender.   Ext: No edema  MS: Adequate ROM spine, shoulders, hips and knees.  Skin: Intact, erythematous maculo papular rash on both upper extremities  Psych: Good eye contact, normal affect. Memory intact not anxious or depressed appearing.  CNS: CN 2-12 intact, power,  normal throughout.no focal deficits noted.   Assessment & Plan  Allergic dermatitis Acute onset of bilateral upper extremity rash following Vit B12 injection. Needs to not get this again, prednisone 5 mg dose pack and bedtime hydroxyzine prescribed. Pt to call if needed, for referral to  allergist  Morbid obesity Improved and she is applauded on this Patient re-educated about  the importance of commitment to a  minimum of 150 minutes of exercise per week.  The importance of healthy food choices with portion control discussed. Encouraged to start a food diary, count calories and to consider  joining a support group. Sample diet sheets offered. Goals set by the patient for the next several months.   Weight /BMI 02/02/2018 01/25/2018 01/01/2018  WEIGHT 345 lb 348 lb 368 lb  HEIGHT 5\' 2"  5\' 2"  5\' 2"   BMI 63.1 kg/m2 63.65 kg/m2 67.31 kg/m2  Some encounter information is confidential and restricted. Go to Review Flowsheets activity to see all data.      Major depressive disorder, recurrent episode, moderate (HCC) Managed by psychiatry and doing well

## 2018-02-02 NOTE — Assessment & Plan Note (Signed)
Acute onset of bilateral upper extremity rash following Vit B12 injection. Needs to not get this again, prednisone 5 mg dose pack and bedtime hydroxyzine prescribed. Pt to call if needed, for referral to allergist

## 2018-02-06 DIAGNOSIS — R5383 Other fatigue: Secondary | ICD-10-CM | POA: Diagnosis not present

## 2018-02-06 DIAGNOSIS — Z713 Dietary counseling and surveillance: Secondary | ICD-10-CM | POA: Diagnosis not present

## 2018-02-06 DIAGNOSIS — I10 Essential (primary) hypertension: Secondary | ICD-10-CM | POA: Diagnosis not present

## 2018-02-10 ENCOUNTER — Telehealth: Payer: Self-pay | Admitting: *Deleted

## 2018-02-10 ENCOUNTER — Ambulatory Visit (INDEPENDENT_AMBULATORY_CARE_PROVIDER_SITE_OTHER): Payer: Medicare HMO | Admitting: Psychiatry

## 2018-02-10 DIAGNOSIS — R69 Illness, unspecified: Secondary | ICD-10-CM | POA: Diagnosis not present

## 2018-02-10 DIAGNOSIS — F331 Major depressive disorder, recurrent, moderate: Secondary | ICD-10-CM

## 2018-02-10 NOTE — Telephone Encounter (Signed)
Pt states she has two prednisone left one for bedtime and one in the morning. The breakout on her arms is getting better but she wanted to know if she needed one more round. Didn't want to be without over the holiday.

## 2018-02-10 NOTE — Progress Notes (Signed)
..              THERAPIST PROGRESS NOTE  Session Time:  Tuesday 02/10/2018 2:20 PM -  2:50 PM                                Participation Level: Active  Behavioral Response: CasualAlert//pleasant, cheerful  Type of Therapy: Individual Therapy  Treatment Goals addressed:  appropriately grieve loss in order to normalize mood and to return to previously adaptive level of functioning  Interventions: Supportive      Summary: [name] is a 57 y.o. female who presents with a history of recurrent depression. She has had two psychiatric hospitalizations. She is a returning patient to this practice and clinician. She ws seen for medication management and outpatient psychotherapy. She is resuming services as symptoms of depression have worsened in recent months. Her 46 yo mother died on 2017-04-17. Patient was her caretaker.  father died 3 years ago. Patient reports additional stress related to assisting her adult children with their financial and parenting responsibilities. Patient's symptoms include depressed mood, poor concentration, tearfulness, irritability, sleep difficulty, fatigue, feelings of hopelessness and worthlessness anxiety, and excessive worrying.  Patient last was seen about 4 weeks ago. She reports doing very well since last session. She has improved self-care regarding nutrition and has lost 24 pounds. She plans to continue weight loss efforts. She reports continued thoughts about mother, feelings of sadness about the loss, and still missing her but managing well. She has been staying busy to cope with the upcoming holidays. However, she reports no longer avoiding her emotions regarding mother and reports now experiencing pleasant emotions as well as recalling fond memories of her mother. She plans to portray her mother in a play at her church tomorrow evening. She is excited and nervous about this. Patient is planning to celebrate Thanksgiving with her immediate family and plans  to prepare meal like her mother used to prepare.   Suicidal/Homicidal: Nowithout intent/plan  Therapist Response:   Praised and reinforced patient's plan to honor mother through a portrayal, discussed her feelings about this, discussed her progress, discussed ways to cope with grief during the holidays and provided patient with handout to review  Plan: Return again in 2 weeks  Diagnosis: Axis I: major depressive disorder, recurrent, moderate    Axis II: Deferred    Adine Heimann, LCSW 02/10/2018

## 2018-02-11 ENCOUNTER — Other Ambulatory Visit: Payer: Self-pay | Admitting: Family Medicine

## 2018-02-11 MED ORDER — PREDNISONE 5 MG PO TABS: ORAL_TABLET | ORAL | 0 refills | Status: DC

## 2018-02-11 NOTE — Telephone Encounter (Signed)
Patient notified

## 2018-02-11 NOTE — Telephone Encounter (Signed)
Please let her know that I have prescribed 1 week of prednisone two times daily for 1 week only

## 2018-02-20 DIAGNOSIS — I10 Essential (primary) hypertension: Secondary | ICD-10-CM | POA: Diagnosis not present

## 2018-02-20 DIAGNOSIS — Z713 Dietary counseling and surveillance: Secondary | ICD-10-CM | POA: Diagnosis not present

## 2018-02-24 ENCOUNTER — Ambulatory Visit (HOSPITAL_COMMUNITY): Payer: Self-pay | Admitting: Psychiatry

## 2018-02-28 ENCOUNTER — Other Ambulatory Visit: Payer: Self-pay | Admitting: Family Medicine

## 2018-03-04 ENCOUNTER — Telehealth: Payer: Self-pay | Admitting: *Deleted

## 2018-03-04 ENCOUNTER — Other Ambulatory Visit: Payer: Self-pay | Admitting: Family Medicine

## 2018-03-04 DIAGNOSIS — R21 Rash and other nonspecific skin eruption: Secondary | ICD-10-CM

## 2018-03-04 NOTE — Telephone Encounter (Signed)
Done--please let her know

## 2018-03-04 NOTE — Telephone Encounter (Signed)
Pt was seen for a rash. She said it is no better and wants to be referred to a dermatologist in [place].

## 2018-03-06 DIAGNOSIS — Z6841 Body Mass Index (BMI) 40.0 and over, adult: Secondary | ICD-10-CM | POA: Diagnosis not present

## 2018-03-06 DIAGNOSIS — Z713 Dietary counseling and surveillance: Secondary | ICD-10-CM | POA: Diagnosis not present

## 2018-03-06 DIAGNOSIS — I10 Essential (primary) hypertension: Secondary | ICD-10-CM | POA: Diagnosis not present

## 2018-03-06 DIAGNOSIS — R5383 Other fatigue: Secondary | ICD-10-CM | POA: Diagnosis not present

## 2018-03-09 ENCOUNTER — Encounter: Payer: Self-pay | Admitting: Cardiology

## 2018-03-09 ENCOUNTER — Ambulatory Visit (INDEPENDENT_AMBULATORY_CARE_PROVIDER_SITE_OTHER): Payer: Medicare HMO | Admitting: Cardiology

## 2018-03-09 ENCOUNTER — Encounter

## 2018-03-09 VITALS — BP 115/77 | HR 77 | Ht 61.0 in | Wt 353.8 lb

## 2018-03-09 DIAGNOSIS — R0602 Shortness of breath: Secondary | ICD-10-CM

## 2018-03-09 DIAGNOSIS — R002 Palpitations: Secondary | ICD-10-CM | POA: Diagnosis not present

## 2018-03-09 NOTE — Patient Instructions (Addendum)

## 2018-03-09 NOTE — Progress Notes (Signed)
Clinical Summary [name] is a 57 y.o.female seen today for follow up of the following medical problems.   1. Palpitations - 05/2015 heart monitor with PACs and PVCs, 6 beat run of NSVT. - reports symptoms few times a day. Can occur at rest or with exertion. Lasts for approx 5 minutes. No other signicant symptoms during episodes - occasional coffee, iced tea every other day x1 glass, no sodas, no energy drinks, no EtoH.   - lopressor stopped by another provider due to her severe depression, we started diltiazem 30mg  bid as alternative.   - no recent symptoms. Only takes dilt in AM, has not required evening dose.   2. DOE - echo 04/2015 LVEF 60-65%, normal diastolic function, mild LAE - starting to go to gym, increasing her physical activity. Working on diet and weight loss.    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 Chronic back pain   . Degenerative joint disease    Left TKA in 07/2009-Dr. Landau  . Depression 2001   doesn't take any meds  . Headache(784.0)    occasionally  . Hypothyroidism    was on Levothyroxine-has been off x 4 months  . Joint pain   . Joint swelling   . Morbid obesity (HCC)   . Nocturia   . Osteoarthritis of right knee 07/13/2013  . Peripheral edema    to right leg;takes Furosemide occasionally and hasnt had one in a month  . Syncope    with palpitations; evaluated by [facility] in 2008 with normal echo     Allergies  Allergen Reactions  . Aleve [Naproxen Sodium] Shortness Of Breath  . B12 Folate [Cobalamin Combinations] Dermatitis  . Penicillins Swelling    .Has patient had a PCN reaction causing immediate rash, facial/tongue/throat swelling, SOB or lightheadedness with hypotension: Yes Has patient had a PCN reaction causing severe rash involving mucus membranes or skin necrosis: No Has  patient had a PCN reaction that required hospitalization: No Has patient had a PCN reaction occurring within the last 10 years: No If all of the above answers are "NO", then may proceed with Cephalosporin use.   . Adhesive [Tape]     rash  . Phentermine   . Latex Rash     Current Outpatient Medications  Medication Sig Dispense Refill  . acyclovir (ZOVIRAX) 400 MG tablet Take 1 tablet (400 mg total) by mouth 3 (three) times daily. 30 tablet 0  . diltiazem (CARDIZEM) 30 MG tablet Take 1 tablet (30 mg total) by mouth 2 (two) times daily. 60 tablet 3  . hydrochlorothiazide (HYDRODIURIL) 25 MG tablet Take 1 tablet (25 mg total) by mouth daily. 90 tablet 3  . hydrOXYzine (ATARAX/VISTARIL) 50 MG tablet TAKE 1 TABLET BY MOUTH AT BEDTIME AS NEEDED FOR ITCHING 90 tablet 0  . ibuprofen (ADVIL,MOTRIN) 800 MG tablet TAKE 1 TABLET BY MOUTH TWICE DAILY AS NEEDED FOR UNCONTROLLED KNEE PAIN 20 tablet 0  . ibuprofen (ADVIL,MOTRIN) 800 MG tablet Take one tablet twice weekly as needed, for knee pain 30 tablet 1  . predniSONE (DELTASONE) 5 MG tablet Take one tablet two times daily  for rash 14 tablet 0  . predniSONE (STERAPRED UNI-PAK 21 TAB) 5 MG (21) TBPK tablet Take 1 tablet (5 mg total) by mouth as directed. Use as directed 21 tablet 0  .  sertraline (ZOLOFT) 100 MG tablet 50 mg daily for one week, then 100 mg daily for one week, then 150 mg daily 45 tablet 2  . temazepam (RESTORIL) 30 MG capsule Take 1 capsule (30 mg total) by mouth at bedtime as needed for sleep. 30 capsule 1   No current facility-administered medications for this visit.      Past Surgical History:  Procedure Laterality Date  . ABDOMINAL HYSTERECTOMY  1995    no oophorectomy  . CARPAL TUNNEL RELEASE  1991   Bilateral  . CESAREAN SECTION     X2  . CHOLECYSTECTOMY    . CHOLECYSTECTOMY, LAPAROSCOPIC  2007   Dr. Lovell Sheehan  . COLONOSCOPY N/A 03/03/2013   Procedure: COLONOSCOPY;  Surgeon: Malissa Hippo, MD;  Location: [facility]  SU[facility];  Service: Endoscopy;  Laterality: N/A;  930  . CRANIECTOMY SUBOCCIPITAL W/ CERVICAL LAMINECTOMY / CHIARI  2007   Repair of Arnold-Chiari malformation  . JOINT REPLACEMENT Left 2011  . KNEE ARTHROSCOPY  1995   Dr. Cleophas Dunker   . THYROIDECTOMY, PARTIAL  2004   Left lobectomy - large benign nodule  . TONSILLECTOMY    . TOTAL KNEE ARTHROPLASTY  07/2009   Left knee , Dr. Dion Saucier   . TOTAL KNEE ARTHROPLASTY Right 07/13/2013   Procedure: TOTAL KNEE ARTHROPLASTY;  Surgeon: Eulas Post, MD;  Location: [facility] OR;  Service: Orthopedics;  Laterality: Right;     Allergies  Allergen Reactions  . Aleve [Naproxen Sodium] Shortness Of Breath  . B12 Folate [Cobalamin Combinations] Dermatitis  . Penicillins Swelling    .Has patient had a PCN reaction causing immediate rash, facial/tongue/throat swelling, SOB or lightheadedness with hypotension: Yes Has patient had a PCN reaction causing severe rash involving mucus membranes or skin necrosis: No Has patient had a PCN reaction that required hospitalization: No Has patient had a PCN reaction occurring within the last 10 years: No If all of the above answers are "NO", then may proceed with Cephalosporin use.   . Adhesive [Tape]     rash  . Phentermine   . Latex Rash      Family History  Problem Relation Age of Onset  . Diabetes Mother   . Depression Mother   . Diabetes Father        emphsema   . Hyperlipidemia Father   . Depression Other   . Colon cancer Neg Hx      Social History Ms. Lammert reports that she has never smoked. She has never used smokeless tobacco. Ms. Gillott reports current alcohol use.   Review of Systems CONSTITUTIONAL: No weight loss, fever, chills, weakness or fatigue.  HEENT: Eyes: No visual loss, blurred vision, double vision or yellow sclerae.No hearing loss, sneezing, congestion, runny nose or sore throat.  SKIN: No rash or itching.  CARDIOVASCULAR: per hpi RESPIRATORY: No shortness of breath,  cough or sputum.  GASTROINTESTINAL: No anorexia, nausea, vomiting or diarrhea. No abdominal pain or blood.  GENITOURINARY: No burning on urination, no polyuria NEUROLOGICAL: No headache, dizziness, syncope, paralysis, ataxia, numbness or tingling in the extremities. No change in bowel or bladder control.  MUSCULOSKELETAL: No muscle, back pain, joint pain or stiffness.  LYMPHATICS: No enlarged nodes. No history of splenectomy.  PSYCHIATRIC: No history of depression or anxiety.  ENDOCRINOLOGIC: No reports of sweating, cold or heat intolerance. No polyuria or polydipsia.  Marland Kitchen   Physical Examination Vitals:   03/09/18 1341  BP: 115/77  Pulse: 77  SpO2: 96%   Vitals:  03/09/18 1341  Weight: (!) 353 lb 12.8 oz (160.5 kg)  Height: 5\' 1"  (1.549 m)    Gen: resting comfortably, no acute distress HEENT: no scleral icterus, pupils equal round and reactive, no palptable cervical adenopathy,  CV: RRR, no m/r/g, no jvd Resp: Clear to auscultation bilaterally GI: abdomen is soft, non-tender, non-distended, normal bowel sounds, no hepatosplenomegaly MSK: extremities are warm, no edema.  Skin: warm, no rash Neuro:  no focal deficits Psych: appropriate affect   Diagnostic Studies 04/2015 Echo Study Conclusions  - Procedure narrative: Transthoracic echocardiography for left  ventricular function evaluation, for right ventricular function  evaluation, and for assessment of valvular function. Image  quality was adequate. The study was technically difficult, as a  result of poor acoustic windows and body habitus. - Left ventricle: The cavity size was normal. Wall thickness was  increased in a pattern of mild LVH. Systolic function was normal.  The estimated ejection fraction was in the range of 60% to 65%.  Images were inadequate for LV wall motion assessment. Left  ventricular diastolic function parameters were normal. - Left atrium: The atrium was mildly dilated.  05/2015 Event  monitor  Telemetry strips show normal sinus rhythm with occasional PACs and PVCs  Reported symptoms correlate with normal sinus rhythm and PACs  Detected 6 beat run of NSVT that was asymptomatic.    Assessment and Plan  1. Palpitations - doing well on diltiazem, continue current meds. Prior monitor with jut benign ectopy.   2. DOE - echo without significant pathology. Symptoms have resolved, continue to monitor   3. Obesity - she is very interested in retrying phentermine. We discussed this may increase her palpitations. She remains interested in trying for weight loss, ok from our standpoint but if increased palpitations would need to come off   F/u 1 year    06/2015, M.D.

## 2018-03-19 ENCOUNTER — Other Ambulatory Visit: Payer: Self-pay | Admitting: Family Medicine

## 2018-03-24 ENCOUNTER — Other Ambulatory Visit (HOSPITAL_COMMUNITY): Payer: Self-pay | Admitting: Family Medicine

## 2018-03-24 DIAGNOSIS — R928 Other abnormal and inconclusive findings on diagnostic imaging of breast: Secondary | ICD-10-CM

## 2018-03-27 ENCOUNTER — Ambulatory Visit (INDEPENDENT_AMBULATORY_CARE_PROVIDER_SITE_OTHER): Payer: Medicare HMO | Admitting: Psychiatry

## 2018-03-27 DIAGNOSIS — F331 Major depressive disorder, recurrent, moderate: Secondary | ICD-10-CM

## 2018-03-27 DIAGNOSIS — I10 Essential (primary) hypertension: Secondary | ICD-10-CM | POA: Diagnosis not present

## 2018-03-27 DIAGNOSIS — Z713 Dietary counseling and surveillance: Secondary | ICD-10-CM | POA: Diagnosis not present

## 2018-03-27 DIAGNOSIS — R69 Illness, unspecified: Secondary | ICD-10-CM | POA: Diagnosis not present

## 2018-03-27 DIAGNOSIS — Z6841 Body Mass Index (BMI) 40.0 and over, adult: Secondary | ICD-10-CM | POA: Diagnosis not present

## 2018-03-27 NOTE — Progress Notes (Signed)
..              THERAPIST PROGRESS NOTE  Session Time:  Friday 04-15-2018 8:15 AM - 9:05 AM                                  Participation Level: Active  Behavioral Response: CasualAlert/  Type of Therapy: Individual Therapy  Treatment Goals addressed:  appropriately grieve loss in order to normalize mood and to return to previously adaptive level of functioning  Interventions: Supportive/grief therapy     Summary: [name] is a 58 y.o. female who presents with a history of recurrent depression. She has had two psychiatric hospitalizations. She is a returning patient to this practice and clinician. She ws seen for medication management and outpatient psychotherapy. She is resuming services as symptoms of depression have worsened in recent months. Her 16 yo mother died on 04-15-17. Patient was her caretaker.  father died 3 years ago. Patient reports additional stress related to assisting her adult children with their financial and parenting responsibilities. Patient's symptoms include depressed mood, poor concentration, tearfulness, irritability, sleep difficulty, fatigue, feelings of hopelessness and worthlessness anxiety, and excessive worrying.  Patient last was seen about 6  weeks ago. She reports continued thoughts about mother, feelings of sadness about the loss, and still missing her but managing well. She enjoyed doing the portrayal of her mother and reported experiencing happiness and good memories of her mother. She and her sister also did a Film/video editor together on mother's birthday in December. She reports enjoying this as well. She  was sick on Christmas Day and reports being tearful that day as she thought about mother. Today is the first anniversary of her mother's death. Patient talks about mother in session without being overwhelmed. She also verbalizes positive memories of mother and expresses increased acceptance of mother's death. She states no longer being angry or  having negative feelings about mother not being here. She reports increased fatigue in recent weeks and attributes this to not getting enough rest. She still is helping out her adult children and her grandchildren. She is hopeful her children will move out by the end of February. She expresses frustration regarding oldest daughter not helping out with chores or paying her share of the bills.   Suicidal/Homicidal: Nowithout intent/plan  Therapist Response:  Discussed effects of doing memorialization, normalized increased thoughts and sadness about mother triggered by the holidays and anniversaries, discussed grief process (acute grief, complicated grief, integrated grief), reviewed stages of grief and assisted patient identify where she is in grief process and the stage she currently is experiencing, facilitated expression of thoughts and feelings regarding relationship with daughter, assisted patient identify her pattern of interaction with daughter, reviewed ways to have assertive communication and set limits, assisted patient identify ways to improve self-care regarding sleep/rest  Plan: Return again in 2 weeks  Diagnosis: Axis I: major depressive disorder, recurrent, moderate    Axis II: Deferred    Murriel Holwerda, LCSW April 15, 2018

## 2018-04-03 DIAGNOSIS — Z713 Dietary counseling and surveillance: Secondary | ICD-10-CM | POA: Diagnosis not present

## 2018-04-03 DIAGNOSIS — I10 Essential (primary) hypertension: Secondary | ICD-10-CM | POA: Diagnosis not present

## 2018-04-03 DIAGNOSIS — Z6841 Body Mass Index (BMI) 40.0 and over, adult: Secondary | ICD-10-CM | POA: Diagnosis not present

## 2018-04-07 ENCOUNTER — Ambulatory Visit (HOSPITAL_COMMUNITY)
Admission: RE | Admit: 2018-04-07 | Discharge: 2018-04-07 | Disposition: A | Discharge status: Home / Self Care | Payer: Medicare HMO | Source: Ambulatory Visit | Attending: Family Medicine | Admitting: Family Medicine

## 2018-04-07 ENCOUNTER — Ambulatory Visit (HOSPITAL_COMMUNITY): Payer: Medicare HMO

## 2018-04-07 DIAGNOSIS — R928 Other abnormal and inconclusive findings on diagnostic imaging of breast: Secondary | ICD-10-CM

## 2018-04-09 NOTE — Progress Notes (Signed)
BH MD/PA/NP OP Progress Note  04/10/2018 8:26 AM [name] Scrape  MRN:  [id number]  Chief Complaint:  Chief Complaint    Depression; Follow-up     HPI:  Patient presents for follow-up appointment for depression.  She states that she has been doing well.  Although it was difficult for the patient to go through the holidays due to loss of her mother, she believes she has been handling things better.  She uses treadmill, although she recently injured her knee.  She is glad that 2 of her daughters will move out and live on their own.  Their children will continue to stay with the patient due to their school.  She feels fine with it.  She has occasional insomnia.  She occasionally feels irritable when she had insomnia.  She has fair concentration and appetite.  She denies SI.  She denies panic attacks.  She takes hydroxyzine once a week wellness when she feels anxious.    Wt Readings from Last 3 Encounters:  04/10/18 (!) 341 lb (154.7 kg)  03/09/18 (!) 353 lb 12.8 oz (160.5 kg)  02/02/18 (!) 345 lb (156.5 kg)     Visit Diagnosis:    ICD-10-CM   1. Recurrent major depressive disorder, in full remission (HCC) F33.42     Past Psychiatric History: Please see initial evaluation for full details. I have reviewed the history. No updates at this time.     Past Medical History:  Past Medical History:  Diagnosis Date  . Chronic back pain   . Degenerative joint disease    Left TKA in 07/2009-Dr. Landau  . Depression 2001   doesn't take any meds  . Headache(784.0)    occasionally  . Hypothyroidism    was on Levothyroxine-has been off x 4 months  . Joint pain   . Joint swelling   . Morbid obesity (HCC)   . Nocturia   . Osteoarthritis of right knee 07/13/2013  . Peripheral edema    to right leg;takes Furosemide occasionally and hasnt had one in a month  . Syncope    with palpitations; evaluated by [facility] in 2008 with normal echo    Past Surgical History:   Procedure Laterality Date  . ABDOMINAL HYSTERECTOMY  1995    no oophorectomy  . CARPAL TUNNEL RELEASE  1991   Bilateral  . CESAREAN SECTION     X2  . CHOLECYSTECTOMY    . CHOLECYSTECTOMY, LAPAROSCOPIC  2007   Dr. Lovell Sheehan  . COLONOSCOPY N/A 03/03/2013   Procedure: COLONOSCOPY;  Surgeon: Malissa Hippo, MD;  Location: [facility];  Service: Endoscopy;  Laterality: N/A;  930  . CRANIECTOMY SUBOCCIPITAL W/ CERVICAL LAMINECTOMY / CHIARI  2007   Repair of Arnold-Chiari malformation  . JOINT REPLACEMENT Left 2011  . KNEE ARTHROSCOPY  1995   Dr. Cleophas Dunker   . THYROIDECTOMY, PARTIAL  2004   Left lobectomy - large benign nodule  . TONSILLECTOMY    . TOTAL KNEE ARTHROPLASTY  07/2009   Left knee , Dr. Dion Saucier   . TOTAL KNEE ARTHROPLASTY Right 07/13/2013   Procedure: TOTAL KNEE ARTHROPLASTY;  Surgeon: Eulas Post, MD;  Location: [facility] OR;  Service: Orthopedics;  Laterality: Right;    Family Psychiatric History: Please see initial evaluation for full details. I have reviewed the history. No updates at this time.     Family History:  Family History  Problem Relation Age of Onset  . Diabetes Mother   . Depression Mother   .  Diabetes Father        emphsema   . Hyperlipidemia Father   . Depression Other   . Colon cancer Neg Hx     Social History:  Social History   Socioeconomic History  . Marital status: Significant Other    Spouse name: Not on file  . Number of children: 3  . Years of education: 5915  . Highest education level: 12th grade  Occupational History  . Occupation: Employed seeking disabilty 2012  Social Needs  . Financial resource strain: Not hard at all  . Food insecurity:    Worry: Never true    Inability: Never true  . Transportation needs:    Medical: No    Non-medical: No  Tobacco Use  . Smoking status: Never Smoker  . Smokeless tobacco: Never Used  Substance and Sexual Activity  . Alcohol use: Yes    Alcohol/week: 0.0 standard drinks    Comment:  rarely  . Drug use: No  . Sexual activity: Yes    Birth control/protection: Surgical  Lifestyle  . Physical activity:    Days per week: 2 days    Minutes per session: 30 min  . Stress: Only a little  Relationships  . Social connections:    Talks on phone: More than three times a week    Gets together: Once a week    Attends religious service: 1 to 4 times per year    Active member of club or organization: Yes    Attends meetings of clubs or organizations: 1 to 4 times per year    Relationship status: Living with partner  Other Topics Concern  . Not on file  Social History Narrative   Is in a new relationship     Allergies:  Allergies  Allergen Reactions  . Aleve [Naproxen Sodium] Shortness Of Breath  . B12 Folate [Cobalamin Combinations] Dermatitis  . Penicillins Swelling    .Has patient had a PCN reaction causing immediate rash, facial/tongue/throat swelling, SOB or lightheadedness with hypotension: Yes Has patient had a PCN reaction causing severe rash involving mucus membranes or skin necrosis: No Has patient had a PCN reaction that required hospitalization: No Has patient had a PCN reaction occurring within the last 10 years: No If all of the above answers are "NO", then may proceed with Cephalosporin use.   . Adhesive [Tape]     rash  . Phentermine   . Latex Rash    Metabolic Disorder Labs: Lab Results  Component Value Date   HGBA1C 6.1 (H) 08/26/2017   MPG 128 08/26/2017   MPG 126 04/11/2017   No results found for: PROLACTIN Lab Results  Component Value Date   CHOL 124 04/11/2017   TRIG 52 04/11/2017   HDL 51 04/11/2017   CHOLHDL 2.4 04/11/2017   VLDL 12 10/02/2016   LDLCALC 60 04/11/2017   LDLCALC 92 10/02/2016   Lab Results  Component Value Date   TSH 2.34 04/11/2017   TSH 5.24 (H) 10/02/2016    Therapeutic Level Labs: No results found for: LITHIUM No results found for: VALPROATE No components found for:  CBMZ  Current  Medications: Current Outpatient Medications  Medication Sig Dispense Refill  . acyclovir (ZOVIRAX) 400 MG tablet TAKE 1 TABLET (400 MG TOTAL) BY MOUTH 3 (THREE) TIMES DAILY. 30 tablet 0  . diltiazem (CARDIZEM) 30 MG tablet Take 1 tablet (30 mg total) by mouth 2 (two) times daily. 60 tablet 3  . hydrochlorothiazide (HYDRODIURIL) 25 MG tablet Take 1  tablet (25 mg total) by mouth daily. 90 tablet 3  . hydrOXYzine (ATARAX/VISTARIL) 50 MG tablet TAKE 1 TABLET BY MOUTH AT BEDTIME AS NEEDED FOR ITCHING 90 tablet 0  . ibuprofen (ADVIL,MOTRIN) 800 MG tablet Take one tablet twice weekly as needed, for knee pain 30 tablet 1  . [START ON 05/08/2018] sertraline (ZOLOFT) 100 MG tablet Take 1.5 tablets (150 mg total) by mouth daily. 135 tablet 0  . Vitamin D, Ergocalciferol, (DRISDOL) 1.25 MG (50000 UT) CAPS capsule TAKE 1 CAPSULE (50,000 UNITS TOTAL) BY MOUTH ONCE A WEEK. ONE CAPSULE ONCE WEEKLY  3   No current facility-administered medications for this visit.      Musculoskeletal: Strength & Muscle Tone: within normal limits Gait & Station: normal Patient leans: N/A  Psychiatric Specialty Exam: Review of Systems  Psychiatric/Behavioral: Negative for depression, hallucinations, memory loss, substance abuse and suicidal ideas. The patient is nervous/anxious and has insomnia.   All other systems reviewed and are negative.   Blood pressure 129/79, pulse 61, height  (1.549 m), weight (!) 341 lb (154.7 kg), SpO2 97 %.Body mass index is 64.43 kg/m.  General Appearance: Fairly Groomed  Eye Contact:  Good  Speech:  Clear and Coherent  Volume:  Normal  Mood:  "good"  Affect:  Appropriate, Congruent and calm  Thought Process:  Coherent  Orientation:  Full (Time, Place, and Person)  Thought Content: Logical   Suicidal Thoughts:  No  Homicidal Thoughts:  No  Memory:  Immediate;   Good  Judgement:  Good  Insight:  Good  Psychomotor Activity:  Normal  Concentration:  Concentration: Good and  Attention Span: Good  Recall:  Good  Fund of Knowledge: Good  Language: Good  Akathisia:  No  Handed:  Right  AIMS (if indicated): not done  Assets:  Communication Skills Desire for Improvement  ADL's:  Intact  Cognition: WNL  Sleep:  Fair   Screenings: AIMS     [facility]  AIMS Total Score  0    AUDIT     Ad[facility] ([facility]  Alcohol Use Disorder Identification Test Final Score (AUDIT)  0    GAD-7     Virtual BH Phone Follow Up from 08/26/2017 in [facility] Virtual BH Visit from 08/14/2017 in [facility]  Total GAD-7 Score  7  14    PHQ2-9     Clinical Support from 01/01/2018 in [facility] Most recent reading at 01/01/2018  3:25 PM Office Visit from 12/18/2017 in [facility] Most recent reading at 12/18/2017  9:24 AM Counselor from [facility]28[facility]2017 [facility] Most recent reading at 10/28/2017  4:41 PM Virtual BH Phone Follow Up from 08/26/2017 in [facility] Most recent reading at 08/26/2017  8:23 PM Office Visit from 08/26/2017 in [facility] Most recent reading at 08/26/2017 10:20 AM  PHQ-2 Total Score  PHQ-9 Total Score  -  -  Assessment and Plan:  [name] is a 59 y.o. year old female with a history of depression, hypertension, obesity, who presents for follow up appointment for Recurrent major depressive disorder, in full remission [facility])    Patient was admitted to [facility] in 08/2017 for worsening depression with SI with no plan/intent in the context  of loss of her family.  # MDD, recurrent in full remission Patient denies significant mood symptoms since switching from duloxetine to sertraline.  Will continue sertraline at the current dose to target depression.  Provided psycho  education of relapse in symptoms.  She agrees to stay on the current medication at least for several months.  Discussed behavioral activation.   Plan 1. Continue sertraline 150 mg daily  2. Continue hydroxyzine 50 mg daily prn for anxiety  3. Return to clinic in four months for 15 mins  The patient demonstrates the following risk factors for suicide: Chronic risk factors for suicide include:psychiatric disorder ofdepressionand completed suicide in a family member. Acute risk factorsfor suicide include: loss (financial, interpersonal, professional) and recent discharge from inpatient psychiatry. Protective factorsfor this patient include: positive social support, responsibility to others (children, family), coping skills and hope for the future. Considering these factors, the overall suicide risk at this point appears to below. Patientisappropriate for outpatient follow up.  Neysa Hotter, MD 04/10/2018, 8:26 AM

## 2018-04-10 ENCOUNTER — Ambulatory Visit (INDEPENDENT_AMBULATORY_CARE_PROVIDER_SITE_OTHER): Payer: Medicare HMO | Admitting: Psychiatry

## 2018-04-10 VITALS — BP 129/79 | HR 61 | Ht 61.0 in | Wt 341.0 lb

## 2018-04-10 DIAGNOSIS — I10 Essential (primary) hypertension: Secondary | ICD-10-CM | POA: Diagnosis not present

## 2018-04-10 DIAGNOSIS — Z6841 Body Mass Index (BMI) 40.0 and over, adult: Secondary | ICD-10-CM | POA: Diagnosis not present

## 2018-04-10 DIAGNOSIS — R69 Illness, unspecified: Secondary | ICD-10-CM | POA: Diagnosis not present

## 2018-04-10 DIAGNOSIS — F3342 Major depressive disorder, recurrent, in full remission: Secondary | ICD-10-CM | POA: Insufficient documentation

## 2018-04-10 MED ORDER — SERTRALINE HCL 100 MG PO TABS: 150.0000 mg | ORAL_TABLET | Freq: Every day | ORAL | 0 refills | Status: DC

## 2018-04-10 NOTE — Patient Instructions (Signed)
1. Continue sertraline 150 mg daily  2. Continue hydroxyzine 50 mg daily prn for anxiety  3. Return to clinic in four months for 15 mins

## 2018-04-14 ENCOUNTER — Ambulatory Visit (HOSPITAL_COMMUNITY): Admission: RE | Admit: 2018-04-14 | Payer: Medicare HMO | Source: Ambulatory Visit

## 2018-04-14 ENCOUNTER — Ambulatory Visit (HOSPITAL_COMMUNITY)
Admission: RE | Admit: 2018-04-14 | Discharge: 2018-04-14 | Disposition: A | Discharge status: Home / Self Care | Payer: Medicare HMO | Source: Ambulatory Visit | Attending: Family Medicine | Admitting: Family Medicine

## 2018-04-14 DIAGNOSIS — Z13220 Encounter for screening for lipoid disorders: Secondary | ICD-10-CM | POA: Diagnosis not present

## 2018-04-14 DIAGNOSIS — R7303 Prediabetes: Secondary | ICD-10-CM | POA: Diagnosis not present

## 2018-04-14 DIAGNOSIS — R928 Other abnormal and inconclusive findings on diagnostic imaging of breast: Secondary | ICD-10-CM | POA: Diagnosis not present

## 2018-04-14 DIAGNOSIS — I10 Essential (primary) hypertension: Secondary | ICD-10-CM | POA: Diagnosis not present

## 2018-04-15 ENCOUNTER — Other Ambulatory Visit: Payer: Self-pay | Admitting: Family Medicine

## 2018-04-15 LAB — LIPID PANEL
CHOL/HDL RATIO: 3.3 (calc) (ref <5.0)
CHOLESTEROL: 147 mg/dL (ref <200)
HDL: 44 mg/dL — AB (ref >50)
LDL CHOLESTEROL (CALC): 84 mg/dL
NON-HDL CHOLESTEROL (CALC): 103 mg/dL (ref <130)
TRIGLYCERIDES: 94 mg/dL (ref <150)

## 2018-04-15 LAB — HEMOGLOBIN A1C
HEMOGLOBIN A1C: 6.1 %{Hb} — AB (ref <5.7)
Mean Plasma Glucose: 128 (calc)
eAG (mmol/L): 7.1 (calc)

## 2018-04-15 LAB — COMPLETE METABOLIC PANEL WITH GFR
AG RATIO: 1 (calc) (ref 1.0–2.5)
ALKALINE PHOSPHATASE (APISO): 75 U/L (ref 33–130)
ALT: 10 U/L (ref 6–29)
AST: 13 U/L (ref 10–35)
Albumin: 4 g/dL (ref 3.6–5.1)
BILIRUBIN TOTAL: 0.5 mg/dL (ref 0.2–1.2)
BUN: 15 mg/dL (ref 7–25)
CHLORIDE: 103 mmol/L (ref 98–110)
CO2: 28 mmol/L (ref 20–32)
Calcium: 9.4 mg/dL (ref 8.6–10.4)
Creat: 0.81 mg/dL (ref 0.50–1.05)
GFR, Est African American: 95 mL/min/{1.73_m2} (ref >=60)
GFR, Est Non African American: 82 mL/min/{1.73_m2} (ref >=60)
GLOBULIN: 3.9 g/dL — AB (ref 1.9–3.7)
Glucose, Bld: 99 mg/dL (ref 65–99)
POTASSIUM: 4.4 mmol/L (ref 3.5–5.3)
SODIUM: 138 mmol/L (ref 135–146)
Total Protein: 7.9 g/dL (ref 6.1–8.1)

## 2018-04-15 LAB — TSH: TSH: 3.67 mIU/L

## 2018-04-17 ENCOUNTER — Ambulatory Visit (HOSPITAL_COMMUNITY): Payer: Medicare HMO | Admitting: Psychiatry

## 2018-04-21 ENCOUNTER — Encounter (HOSPITAL_COMMUNITY): Payer: Self-pay

## 2018-04-21 ENCOUNTER — Other Ambulatory Visit (HOSPITAL_COMMUNITY): Payer: Medicare HMO

## 2018-04-21 ENCOUNTER — Other Ambulatory Visit (HOSPITAL_COMMUNITY): Payer: Self-pay

## 2018-04-21 ENCOUNTER — Encounter (HOSPITAL_COMMUNITY): Payer: Medicare HMO

## 2018-04-22 ENCOUNTER — Other Ambulatory Visit (HOSPITAL_COMMUNITY)
Admission: RE | Admit: 2018-04-22 | Discharge: 2018-04-22 | Disposition: A | Discharge status: Home / Self Care | Payer: Medicare HMO | Source: Ambulatory Visit | Attending: Family Medicine | Admitting: Family Medicine

## 2018-04-22 ENCOUNTER — Ambulatory Visit (INDEPENDENT_AMBULATORY_CARE_PROVIDER_SITE_OTHER): Payer: Medicare HMO | Admitting: Family Medicine

## 2018-04-22 ENCOUNTER — Encounter: Payer: Self-pay | Admitting: Family Medicine

## 2018-04-22 VITALS — BP 118/80 | HR 63 | Resp 15 | Ht 61.0 in | Wt 344.0 lb

## 2018-04-22 DIAGNOSIS — Z0000 Encounter for general adult medical examination without abnormal findings: Secondary | ICD-10-CM | POA: Diagnosis not present

## 2018-04-22 DIAGNOSIS — R7303 Prediabetes: Secondary | ICD-10-CM | POA: Diagnosis not present

## 2018-04-22 DIAGNOSIS — M5430 Sciatica, unspecified side: Secondary | ICD-10-CM

## 2018-04-22 DIAGNOSIS — M170 Bilateral primary osteoarthritis of knee: Secondary | ICD-10-CM | POA: Diagnosis not present

## 2018-04-22 DIAGNOSIS — I10 Essential (primary) hypertension: Secondary | ICD-10-CM

## 2018-04-22 DIAGNOSIS — M549 Dorsalgia, unspecified: Secondary | ICD-10-CM

## 2018-04-22 DIAGNOSIS — F331 Major depressive disorder, recurrent, moderate: Secondary | ICD-10-CM

## 2018-04-22 DIAGNOSIS — F3342 Major depressive disorder, recurrent, in full remission: Secondary | ICD-10-CM

## 2018-04-22 DIAGNOSIS — R69 Illness, unspecified: Secondary | ICD-10-CM | POA: Diagnosis not present

## 2018-04-22 MED ORDER — METFORMIN HCL 500 MG PO TABS: 500.0000 mg | ORAL_TABLET | Freq: Every day | ORAL | 3 refills | Status: DC

## 2018-04-22 MED ORDER — IBUPROFEN 800 MG PO TABS: ORAL_TABLET | ORAL | 1 refills | Status: DC

## 2018-04-22 NOTE — Assessment & Plan Note (Signed)

## 2018-04-22 NOTE — Progress Notes (Signed)
[name]     MRN: [id number]      DOB: 12/06/1960  HPI: Patient is in for annual physical exam. . Recent labs, if available are reviewed.Metformin is prescribed as she is prediabetic,and morbidly obese  Ibuprofen refilled, for knee and back pain per request Immunization is reviewed , and  updated if needed.   PE: BP 118/80   Pulse 63   Resp 15   Ht 5\' 1"  (1.549 m)   Wt (!) 344 lb (156 kg)   SpO2 97%   BMI 65.00 kg/m   Pleasant  female, alert and oriented x 3, in no cardio-pulmonary distress. Afebrile. HEENT No facial trauma or asymetry. Sinuses non tender.  Extra occullar muscles intact, pupils equally reactive to light. External ears normal, tympanic membranes clear. Oropharynx moist, no exudate. Neck: supple, no adenopathy,JVD or thyromegaly.No bruits.  Chest: Clear to ascultation bilaterally.No crackles or wheezes. Non tender to palpation  Breast: No asymetry,no masses or lumps. No tenderness. No nipple discharge or inversion. No axillary or supraclavicular adenopathy  Cardiovascular system; Heart sounds normal,  S1 and  S2 ,no S3.  No murmur, or thrill. Apical beat not displaced Peripheral pulses normal.  Abdomen: Soft, non tender, no organomegaly or masses. No bruits. Bowel sounds normal. No guarding, tenderness or rebound.   GU: External genitalia normal female genitalia , normal female distribution of hair. No lesions. Urethral meatus normal in size, no  Prolapse, no lesions visibly  Present. Bladder non tender. Vagina pink and moist , with no visible lesions , discharge present . InAdequate pelvic support   Cystocele no r rectocele noted  Uterus absent, no adnexal masses, no  adnexal tenderness.   Musculoskeletal exam: Full ROM of spine, hips , shoulders and knees. No deformity ,swelling or crepitus noted. No muscle wasting or atrophy.   Neurologic: Cranial nerves 2 to 12 intact. Power, tone ,sensation and reflexes normal  throughout. No disturbance in gait. No tremor.  Skin: Intact, no ulceration, erythema , scaling or rash noted. Pigmentation normal throughout  Psych; Normal mood and affect. Judgement and concentration normal   Assessment & Plan:  Annual physical exam Annual exam as documented. Counseling done  re healthy lifestyle involving commitment to 150 minutes exercise per week, heart healthy diet, and attaining healthy weight.The importance of adequate sleep also discussed. Regular seat belt use and home safety, is also discussed. Changes in health habits are decided on by the patient with goals and time frames  set for achieving them. Immunization and cancer screening needs are specifically addressed at this visit.   Morbid obesity Improved, she is applauded on this and metformin added Patient re-educated about  the importance of commitment to a  minimum of 150 minutes of exercise per week.  The importance of healthy food choices with portion control discussed. Encouraged to start a food diary, count calories and to consider  joining a support group. Sample diet sheets offered. Goals set by the patient for the next several months.   Weight /BMI 04/22/2018 03/09/2018 02/02/2018  WEIGHT 344 lb 353 lb 12.8 oz 345 lb  HEIGHT 5\' 1"  5\' 1"  5\' 2"   BMI 65 kg/m2 66.85 kg/m2 63.1 kg/m2  Some encounter information is confidential and restricted. Go to Review Flowsheets activity to see all data.      Prediabetes Patient educated about the importance of limiting  Carbohydrate intake , the need to commit to daily physical activity for a minimum of 30 minutes , and to commit  weight loss. The fact that changes in all these areas will reduce or eliminate all together the development of diabetes is stressed.   Diabetic Labs Latest Ref Rng & Units 04/14/2018 08/26/2017 08/14/2017 04/11/2017 10/02/2016  HbA1c <5.7 % of total Hgb 6.1(H) 6.1(H) - 6.0(H) 5.8(H)  Microalbumin Not estab mg/dL - - - - -    Micro/Creat Ratio <30 mcg/mg creat - - - - -  Chol <200 mg/dL 425 - - 956 387  HDL >56 mg/dL 43(P) - - 51 51  Calc LDL mg/dL (calc) 84 - - 60 92  Triglycerides <150 mg/dL 94 - - 52 61  Creatinine 0.50 - 1.05 mg/dL 2.95 - 1.88 4.16 6.06   BP/Weight 04/22/2018 03/09/2018 02/02/2018 01/25/2018 01/01/2018 12/18/2017 08/26/2017  Systolic BP 118 115 120 149 139 122 120  Diastolic BP 80 77 84 88 80 66 80  Wt. (Lbs) 344 353.8 345 348 368 360 353  BMI 65 66.85 63.1 63.65 67.31 65.84 66.7  Some encounter information is confidential and restricted. Go to Review Flowsheets activity to see all data.   Foot/eye exam completion dates 07/31/2015  Foot Form Completion Done    Updated lab needed at/ before next visit. Start metformin  Back pain with sciatica Intermittent need for ibuprofen 30 prescribed  Recurrent major depressive disorder, in full remission (HCC) Treated by psychiatry and in full remission, score of 2 on today/s visit

## 2018-04-22 NOTE — Assessment & Plan Note (Deleted)
Treated by psychiatry and at goal, pHQ 9 score at viosit

## 2018-04-22 NOTE — Assessment & Plan Note (Signed)
Improved, she is applauded on this and metformin added Patient re-educated about  the importance of commitment to a  minimum of 150 minutes of exercise per week.  The importance of healthy food choices with portion control discussed. Encouraged to start a food diary, count calories and to consider  joining a support group. Sample diet sheets offered. Goals set by the patient for the next several months.   Weight /BMI 04/22/2018 03/09/2018 02/02/2018  WEIGHT 344 lb 353 lb 12.8 oz 345 lb  HEIGHT 5\' 1"  5\' 1"  5\' 2"   BMI 65 kg/m2 66.85 kg/m2 63.1 kg/m2  Some encounter information is confidential and restricted. Go to Review Flowsheets activity to see all data.

## 2018-04-22 NOTE — Assessment & Plan Note (Signed)
Patient educated about the importance of limiting  Carbohydrate intake , the need to commit to daily physical activity for a minimum of 30 minutes , and to commit weight loss. The fact that changes in all these areas will reduce or eliminate all together the development of diabetes is stressed.   Diabetic Labs Latest Ref Rng & Units 04/14/2018 08/26/2017 08/14/2017 04/11/2017 10/02/2016  HbA1c <5.7 % of total Hgb 6.1(H) 6.1(H) - 6.0(H) 5.8(H)  Microalbumin Not estab mg/dL - - - - -  Micro/Creat Ratio <30 mcg/mg creat - - - - -  Chol <200 mg/dL 161147 - - 096124 045155  HDL >40>50 mg/dL 98(J44(L) - - 51 51  Calc LDL mg/dL (calc) 84 - - 60 92  Triglycerides <150 mg/dL 94 - - 52 61  Creatinine 0.50 - 1.05 mg/dL 1.910.81 - 4.780.72 2.950.71 6.210.71   BP/Weight 04/22/2018 03/09/2018 02/02/2018 01/25/2018 01/01/2018 12/18/2017 08/26/2017  Systolic BP 118 115 120 149 139 122 120  Diastolic BP 80 77 84 88 80 66 80  Wt. (Lbs) 344 353.8 345 348 368 360 353  BMI 65 66.85 63.1 63.65 67.31 65.84 66.7  Some encounter information is confidential and restricted. Go to Review Flowsheets activity to see all data.   Foot/eye exam completion dates 07/31/2015  Foot Form Completion Done    Updated lab needed at/ before next visit. Start metformin

## 2018-04-22 NOTE — Assessment & Plan Note (Signed)
Intermittent need for ibuprofen 30 prescribed

## 2018-04-22 NOTE — Progress Notes (Signed)
   KHYLAH ISEMAN     MRN: [id number]      DOB: Sep 22, 1960   HPI Ms. Seres is here for follow up and re-evaluation of chronic medical conditions, medication management and review of any available recent lab and radiology data.  Preventive health is updated, specifically  Cancer screening and Immunization.   Questions or concerns regarding consultations or procedures which the PT has had in the interim are  addressed. The PT denies any adverse reactions to current medications since the last visit.  There are no new concerns.  There are no specific complaints   ROS Denies recent fever or chills. Denies sinus pressure, nasal congestion, ear pain or sore throat. Denies chest congestion, productive cough or wheezing. Denies chest pains, palpitations and leg swelling Denies abdominal pain, nausea, vomiting,diarrhea or constipation.   Denies dysuria, frequency, hesitancy or incontinence. Denies joint pain, swelling and limitation in mobility. Denies headaches, seizures, numbness, or tingling. Denies depression, anxiety or insomnia. Denies skin break down or rash.   PE  There were no vitals taken for this visit.  Patient alert and oriented and in no cardiopulmonary distress.  HEENT: No facial asymmetry, EOMI,   oropharynx pink and moist.  Neck supple no JVD, no mass.  Chest: Clear to auscultation bilaterally.  CVS: S1, S2 no murmurs, no S3.Regular rate.  ABD: Soft non tender.   Ext: No edema  MS: Adequate ROM spine, shoulders, hips and knees.  Skin: Intact, no ulcerations or rash noted.  Psych: Good eye contact, normal affect. Memory intact not anxious or depressed appearing.  CNS: CN 2-12 intact, power,  normal throughout.no focal deficits noted.   Assessment & Plan  ***

## 2018-04-22 NOTE — Patient Instructions (Signed)
F/U in 4.5 months, call if you need me before  Start metformin 500 mg one daily  Increase exercise to at least 5 days/ week  congrats on weight loss , goal of 10 pounds  HBA1c, chem 7 and eGFR 1 week before next visit  Think about what you will eat, plan ahead. Choose " clean, green, fresh or frozen" over canned, processed or packaged foods which are more sugary, salty and fatty. 70 to 75% of food eaten should be vegetables and fruit. Three meals at set times with snacks allowed between meals, but they must be fruit or vegetables. Aim to eat over a 12 hour period , example 7 am to 7 pm, and STOP after  your last meal of the day. Drink water,generally about 64 ounces per day, no other drink is as healthy. Fruit juice is best enjoyed in a healthy way, by EATING the fruit.   

## 2018-04-22 NOTE — Assessment & Plan Note (Signed)
Treated by psychiatry and in full remission, score of 2 on today/s visit

## 2018-04-24 DIAGNOSIS — I10 Essential (primary) hypertension: Secondary | ICD-10-CM | POA: Diagnosis not present

## 2018-04-24 DIAGNOSIS — Z6841 Body Mass Index (BMI) 40.0 and over, adult: Secondary | ICD-10-CM | POA: Diagnosis not present

## 2018-04-25 LAB — CYTOLOGY - PAP
Diagnosis: NEGATIVE
HPV: NOT DETECTED

## 2018-04-26 ENCOUNTER — Encounter: Payer: Self-pay | Admitting: Family Medicine

## 2018-05-01 ENCOUNTER — Encounter (HOSPITAL_COMMUNITY): Payer: Self-pay | Admitting: Psychiatry

## 2018-05-01 ENCOUNTER — Ambulatory Visit (INDEPENDENT_AMBULATORY_CARE_PROVIDER_SITE_OTHER): Payer: Medicare HMO | Admitting: Psychiatry

## 2018-05-01 DIAGNOSIS — F3342 Major depressive disorder, recurrent, in full remission: Secondary | ICD-10-CM | POA: Diagnosis not present

## 2018-05-01 DIAGNOSIS — R69 Illness, unspecified: Secondary | ICD-10-CM | POA: Diagnosis not present

## 2018-05-01 NOTE — Progress Notes (Signed)
..              THERAPIST PROGRESS NOTE  Session Time:  Friday 05/01/2018 9:16 AM - 9:56 AM                                   Participation Level: Active  Behavioral Response: CasualAlert/  Type of Therapy: Individual Therapy  Treatment Goals addressed:  appropriately grieve loss in order to normalize mood and to return to previously adaptive level of functioning  Interventions: Supportive/grief therapy     Summary: [name] is a 58 y.o. female who presents with a history of recurrent depression. She has had two psychiatric hospitalizations. She is a returning patient to this practice and clinician. She ws seen for medication management and outpatient psychotherapy. She is resuming services as symptoms of depression have worsened in recent months. Her 46 yo mother died on April 10, 2017. Patient was her caretaker.  father died 3 years ago. Patient reports additional stress related to assisting her adult children with their financial and parenting responsibilities. Patient's symptoms include depressed mood, poor concentration, tearfulness, irritability, sleep difficulty, fatigue, feelings of hopelessness and worthlessness anxiety, and excessive worrying.  Patient last was seen about 6 weeks ago. She reports doing well and being a good mood more frequently. She expresses relief one of her daughter's is planning to move into her own place next Friday. She also is pleased another daughter is residing in patient's former home and is assuming payments. Patient reports being assertive with this daughter and setting limits. She also reports being assertive with oldest daughter regarding regular financial contributions to household as she resides with patient. She reports coping much better with loss of parents and states visiting their graves earlier this week. She reports still experiencing sadness at times but now accepting their deaths. Patient is very pleased with her progress in therapy.    Suicidal/Homicidal: Nowithout intent/plan  Therapist Response:  Reviewed symptoms, discussed stressors, facilitated expression of thoughts and feelings, validated feelings, praised and reinforced patient's use of assertiveness skills, discussed effects, processed grief and loss issues, reviewed stages of grief and discussed the stage patient is experiencing, reviewed treatment plan, discussed lapse versus relapse of depression, assisted patient identify earlly warning signs and strategies to use to avoid relapse, discussed step down plan for termination to include one more session focused on developing a mental health maintenance plan.  Plan: Return again in 2 weeks  Diagnosis: Axis I: major depressive disorder, recurrent, moderate    Axis II: Deferred    Adah Salvage, LCSW 05/01/2018

## 2018-05-08 DIAGNOSIS — I10 Essential (primary) hypertension: Secondary | ICD-10-CM | POA: Diagnosis not present

## 2018-05-08 DIAGNOSIS — Z6841 Body Mass Index (BMI) 40.0 and over, adult: Secondary | ICD-10-CM | POA: Diagnosis not present

## 2018-05-27 ENCOUNTER — Telehealth: Payer: Self-pay | Admitting: *Deleted

## 2018-05-27 ENCOUNTER — Other Ambulatory Visit: Payer: Self-pay | Admitting: Family Medicine

## 2018-05-27 MED ORDER — PHENTERMINE HCL 15 MG PO CAPS: 15.0000 mg | ORAL_CAPSULE | ORAL | 2 refills | Status: DC

## 2018-05-27 NOTE — Telephone Encounter (Signed)
Correction, I sent the 15 mg capsule one daily, that is what she last had

## 2018-05-27 NOTE — Telephone Encounter (Signed)
Spoke with patient and advised her that Phentermine has been sent in. Also advised her of Dr.Simpson's weight loss expectations with verbal understanding.

## 2018-05-27 NOTE — Telephone Encounter (Signed)
pls advise start half daily till she returnes for June visit , I expect weighb  loss of 4 3 pouinds per month, script says one daily , take half, and needs to keep f/u

## 2018-05-27 NOTE — Telephone Encounter (Signed)
Pt called in said she needed a refill on her phentermine. It was helping her but now she is out. She was requesting a call back from the nurse.

## 2018-05-27 NOTE — Telephone Encounter (Signed)
Spoke with patient and she states she had told Dr.Simpson that she was fine without the Phentermine but now she thinks she needs the medicine to help her lose the weight. She would like a prescription to help her get started please with the weight loss.

## 2018-06-01 ENCOUNTER — Encounter (HOSPITAL_COMMUNITY): Payer: Self-pay | Admitting: Psychiatry

## 2018-06-01 ENCOUNTER — Ambulatory Visit (INDEPENDENT_AMBULATORY_CARE_PROVIDER_SITE_OTHER): Payer: Medicare HMO | Admitting: Psychiatry

## 2018-06-01 ENCOUNTER — Other Ambulatory Visit: Payer: Self-pay

## 2018-06-01 DIAGNOSIS — F3342 Major depressive disorder, recurrent, in full remission: Secondary | ICD-10-CM

## 2018-06-01 DIAGNOSIS — R69 Illness, unspecified: Secondary | ICD-10-CM | POA: Diagnosis not present

## 2018-06-01 NOTE — Progress Notes (Signed)
..              THERAPIST PROGRESS NOTE  Session Time:  Monday 06/01/2018 9:10 AM - 9:58 AM                              Participation Level: Active  Behavioral Response: CasualAlert/euthymic  Type of Therapy: Individual Therapy  Treatment Goals addressed:  appropriately grieve loss in order to normalize mood and to return to previously adaptive level of functioning  Interventions: Supportive/grief therapy     Summary: [name] is a 58 y.o. female who presents with a history of recurrent depression. She has had two psychiatric hospitalizations. She is a returning patient to this practice and clinician. She ws seen for medication management and outpatient psychotherapy. She is resuming services as symptoms of depression have worsened in recent months. Her 65 yo mother died on Apr 04, 2017. Patient was her caretaker.  father died 3 years ago. Patient reports additional stress related to assisting her adult children with their financial and parenting responsibilities. Patient's symptoms include depressed mood, poor concentration, tearfulness, irritability, sleep difficulty, fatigue, feelings of hopelessness and worthlessness anxiety, and excessive worrying.  Patient last was seen about 4 weeks ago. She reports continuing to do well. She expresses acceptance of death of parents and has resumed normal interest and involvement in activities. She enjoys time with her family and has increased physical activity through walking. She is looking forward to going on a beach trip later this year. She has continued to improve assertiveness skills. She reports stress related to issues with her boyfriend but reports managing well through the use of assertiveness skills.  Suicidal/Homicidal: Nowithout intent/plan  Therapist Response:  Reviewed symptoms, facilitated expression of thoughts and feelings, validated feelings, assisted patient develop mental health maintenance plan (identified triggers and  early warnings signs of depression, developed plan for daily self-care activities, identified and practiced coping strategies to use when experiencing problems, identified signs that indicate patient may need to return to therapy), discussed patient's feelings about termination  Plan: Patient has accomplished her treatment goals. Therefore, patient and therapist agree to terminate psychotherapy services at this time. Patient is encouraged to call this practice should she need psychotherapy services in the future.  Diagnosis: Axis I: major depressive disorder    Axis II: Deferred    Vickie Salvage, LCSW 06/01/2018   Outpatient Therapist Discharge Summary  [name]    1960/11/19   Admission Date: 04/08/2017  Discharge Date:  06/01/2018 Reason for Discharge:  Treatment completed Diagnosis:  Axis I:  Recurrent major depressive disorder, in full remission [facility])  Comments: Patient continues to see psychiatrist for medication management. She is encouraged to call this practice should she need psychotherapy services in the future.   Alexy Heldt E Chamia Schmutz LCSW

## 2018-06-05 DIAGNOSIS — Z6841 Body Mass Index (BMI) 40.0 and over, adult: Secondary | ICD-10-CM | POA: Diagnosis not present

## 2018-06-05 DIAGNOSIS — I10 Essential (primary) hypertension: Secondary | ICD-10-CM | POA: Diagnosis not present

## 2018-06-19 DIAGNOSIS — Z6841 Body Mass Index (BMI) 40.0 and over, adult: Secondary | ICD-10-CM | POA: Diagnosis not present

## 2018-06-19 DIAGNOSIS — I10 Essential (primary) hypertension: Secondary | ICD-10-CM | POA: Diagnosis not present

## 2018-06-26 DIAGNOSIS — Z6841 Body Mass Index (BMI) 40.0 and over, adult: Secondary | ICD-10-CM | POA: Diagnosis not present

## 2018-06-26 DIAGNOSIS — I10 Essential (primary) hypertension: Secondary | ICD-10-CM | POA: Diagnosis not present

## 2018-07-03 ENCOUNTER — Other Ambulatory Visit: Payer: Self-pay | Admitting: Family Medicine

## 2018-07-07 ENCOUNTER — Telehealth: Payer: Self-pay | Admitting: Cardiology

## 2018-07-07 NOTE — Telephone Encounter (Signed)
Forward to Dr Branch 

## 2018-07-07 NOTE — Telephone Encounter (Signed)
Wants to know if its ok to increase Phenemine to 37.5mg  / tg

## 2018-07-08 NOTE — Telephone Encounter (Signed)
Would be up to pcp to increase dose. If current dose is not causing her to have more palpitations then Im ok with higher dose   J Jazlene Bares MD

## 2018-07-08 NOTE — Telephone Encounter (Signed)
Patient notified.  Will forward this note to pmd & [facility].

## 2018-07-08 NOTE — Telephone Encounter (Signed)
Fax number for Delrae Rend, MD is [phone].

## 2018-07-09 ENCOUNTER — Encounter: Payer: Self-pay | Admitting: Family Medicine

## 2018-07-09 ENCOUNTER — Other Ambulatory Visit: Payer: Self-pay | Admitting: Family Medicine

## 2018-07-09 MED ORDER — PHENTERMINE HCL 37.5 MG PO TABS: 37.5000 mg | ORAL_TABLET | Freq: Every day | ORAL | 1 refills | Status: DC

## 2018-07-10 DIAGNOSIS — I10 Essential (primary) hypertension: Secondary | ICD-10-CM | POA: Diagnosis not present

## 2018-07-10 DIAGNOSIS — Z6841 Body Mass Index (BMI) 40.0 and over, adult: Secondary | ICD-10-CM | POA: Diagnosis not present

## 2018-07-10 DIAGNOSIS — R5383 Other fatigue: Secondary | ICD-10-CM | POA: Diagnosis not present

## 2018-07-10 NOTE — Telephone Encounter (Signed)
Prescription sent

## 2018-07-21 ENCOUNTER — Other Ambulatory Visit: Payer: Self-pay | Admitting: Family Medicine

## 2018-07-22 ENCOUNTER — Other Ambulatory Visit: Payer: Self-pay | Admitting: Family Medicine

## 2018-07-22 MED ORDER — HYDROXYZINE HCL 50 MG PO TABS: ORAL_TABLET | ORAL | 1 refills | Status: DC

## 2018-07-22 MED ORDER — HYDROXYZINE PAMOATE 50 MG PO CAPS: 50.0000 mg | ORAL_CAPSULE | Freq: Three times a day (TID) | ORAL | 0 refills | Status: DC | PRN

## 2018-07-28 ENCOUNTER — Other Ambulatory Visit (HOSPITAL_COMMUNITY): Payer: Self-pay | Admitting: Psychiatry

## 2018-07-28 MED ORDER — SERTRALINE HCL 100 MG PO TABS: 150.0000 mg | ORAL_TABLET | Freq: Every day | ORAL | 0 refills | Status: DC

## 2018-08-05 NOTE — Progress Notes (Signed)
Virtual Visit via Video Note  I connected with [name] on 08/11/18 at 11:00 AM EDT by a video enabled telemedicine application and verified that I am speaking with the correct person using two identifiers.   I discussed the limitations of evaluation and management by telemedicine and the availability of in person appointments. The patient expressed understanding and agreed to proceed.   I discussed the assessment and treatment plan with the patient. The patient was provided an opportunity to ask questions and all were answered. The patient agreed with the plan and demonstrated an understanding of the instructions.   The patient was advised to call back or seek an in-person evaluation if the symptoms worsen or if the condition fails to improve as anticipated.  I provided 15 minutes of non-face-to-face time during this encounter.   Neysa Hottereina Labella Zahradnik, MD    [facility] Progress Note  08/11/2018 11:23 AM [name]  MRN:  [id number]  Chief Complaint:  Chief Complaint    Follow-up; Depression     HPI:  This is a follow-up visit for depression.  She states that "everything is great." She reports "really better (she smiles while talking about this)" relationship with her children.  They now pay bills. They will move out soon. She also had a great mother's day; her children cooked for her. Although she missed her mother, she was fine afterwards.  Although she also felt sad in April, as her father's birthday was on April 22nd, she felt good afterwards. She finds her children and grandchildren to be helpful.  She works as a Marine scientistclient service manager at times. She has occasional insomnia. She has good motivation, energy. She has fair concentration. She denies SI. She feels less anxious. She denies panic attacks. She has been trying to be active, taking a walk when the weather is good.   328 lbs Wt Readings from Last 3 Encounters:  04/22/18 (!) 344 lb (156 kg)  04/10/18 (!) 341  lb (154.7 kg)  03/09/18 (!) 353 lb 12.8 oz (160.5 kg)    Visit Diagnosis:    ICD-10-CM   1. Recurrent major depressive disorder, in full remission (HCC) F33.42     Past Psychiatric History: Please see initial evaluation for full details. I have reviewed the history. No updates at this time.     Past Medical History:  Past Medical History:  Diagnosis Date  . Chronic back pain   . Degenerative joint disease    Left TKA in 07/2009-Dr. Landau  . Depression 2001   doesn't take any meds  . Headache(784.0)    occasionally  . Hypothyroidism    was on Levothyroxine-has been off x 4 months  . Joint pain   . Joint swelling   . Morbid obesity (HCC)   . Nocturia   . Osteoarthritis of right knee 07/13/2013  . Peripheral edema    to right leg;takes Furosemide occasionally and hasnt had one in a month  . Syncope    with palpitations; [facility] with normal echo    Past Surgical History:  Procedure Laterality Date  . ABDOMINAL HYSTERECTOMY  1995    no oophorectomy  . CARPAL TUNNEL RELEASE  1991   Bilateral  . CESAREAN SECTION     X2  . CHOLECYSTECTOMY    . CHOLECYSTECTOMY, LAPAROSCOPIC  2007   Dr. Lovell SheehanJenkins  . COLONOSCOPY N/A 03/03/2013   Procedure: COLONOSCOPY;  Surgeon: Malissa HippoNajeeb U Rehman, MD;  Location: [facility];  Service: Endoscopy;  Laterality: N/A;  930  . CRANIECTOMY SUBOCCIPITAL W/ CERVICAL LAMINECTOMY / CHIARI  2007   Repair of Arnold-Chiari malformation  . EYE SURGERY     bilateral cataract surgery  . JOINT REPLACEMENT Left 2011  . KNEE ARTHROSCOPY  1995   Dr. Cleophas DunkerWhitfield   . THYROIDECTOMY, PARTIAL  2004   Left lobectomy - large benign nodule  . TONSILLECTOMY    . TOTAL KNEE ARTHROPLASTY  07/2009   Left knee , Dr. Dion SaucierLandau   . TOTAL KNEE ARTHROPLASTY Right 07/13/2013   Procedure: TOTAL KNEE ARTHROPLASTY;  Surgeon: Eulas PostJoshua P Landau, MD;  Location: [facility] OR;  Service: Orthopedics;  Laterality: Right;    Family Psychiatric History:  Please see initial evaluation for full details. I have reviewed the history. No updates at this time.     Family History:  Family History  Problem Relation Age of Onset  . Diabetes Mother   . Depression Mother   . Diabetes Father        emphsema   . Hyperlipidemia Father   . Depression Other   . Colon cancer Neg Hx     Social History:  Social History   Socioeconomic History  . Marital status: Significant Other    Spouse name: Not on file  . Number of children: 3  . Years of education: 2615  . Highest education level: 12th grade  Occupational History  . Occupation: Employed seeking disabilty 2012  Social Needs  . Financial resource strain: Not hard at all  . Food insecurity:    Worry: Never true    Inability: Never true  . Transportation needs:    Medical: No    Non-medical: No  Tobacco Use  . Smoking status: Never Smoker  . Smokeless tobacco: Never Used  Substance and Sexual Activity  . Alcohol use: Yes    Alcohol/week: 0.0 standard drinks    Comment: rarely  . Drug use: No  . Sexual activity: Yes    Birth control/protection: Surgical  Lifestyle  . Physical activity:    Days per week: 2 days    Minutes per session: 30 min  . Stress: Only a little  Relationships  . Social connections:    Talks on phone: More than three times a week    Gets together: Once a week    Attends religious service: 1 to 4 times per year    Active member of club or organization: Yes    Attends meetings of clubs or organizations: 1 to 4 times per year    Relationship status: Living with partner  Other Topics Concern  . Not on file  Social History Narrative   Is in a new relationship     Allergies:  Allergies  Allergen Reactions  . Aleve [Naproxen Sodium] Shortness Of Breath  . B12 Folate [Cobalamin Combinations] Dermatitis  . Penicillins Swelling    .Has patient had a PCN reaction causing immediate rash, facial/tongue/throat swelling, SOB or lightheadedness with hypotension:  Yes Has patient had a PCN reaction causing severe rash involving mucus membranes or skin necrosis: No Has patient had a PCN reaction that required hospitalization: No Has patient had a PCN reaction occurring within the last 10 years: No If all of the above answers are "NO", then may proceed with Cephalosporin use.   . Adhesive [Tape]     rash  . Phentermine   . Latex Rash    Metabolic Disorder Labs: Lab Results  Component Value Date  HGBA1C 6.1 (H) 04/14/2018   MPG 128 04/14/2018   MPG 128 08/26/2017   No results found for: PROLACTIN Lab Results  Component Value Date   CHOL 147 04/14/2018   TRIG 94 04/14/2018   HDL 44 (L) 04/14/2018   CHOLHDL 3.3 04/14/2018   VLDL 12 10/02/2016   LDLCALC 84 04/14/2018   LDLCALC 60 04/11/2017   Lab Results  Component Value Date   TSH 3.67 04/14/2018   TSH 2.34 04/11/2017    Therapeutic Level Labs: No results found for: LITHIUM No results found for: VALPROATE No components found for:  CBMZ  Current Medications: Current Outpatient Medications  Medication Sig Dispense Refill  . acyclovir (ZOVIRAX) 400 MG tablet TAKE 1 TABLET BY MOUTH 3 (THREE) TIMES DAILY. 30 tablet 0  . diltiazem (CARDIZEM) 30 MG tablet Take 1 tablet (30 mg total) by mouth 2 (two) times daily. 60 tablet 3  . hydrochlorothiazide (HYDRODIURIL) 25 MG tablet TAKE 1 TABLET BY MOUTH EVERY DAY 90 tablet 3  . hydrOXYzine (ATARAX/VISTARIL) 50 MG tablet TAKE 1 TABLET BY MOUTH AT BEDTIME AS NEEDED FOR ITCHING 90 tablet 0  . hydrOXYzine (ATARAX/VISTARIL) 50 MG tablet Take one tablet at bedtime , for itching 90 tablet 1  . ibuprofen (ADVIL,MOTRIN) 800 MG tablet Take on etablet twice weekly as needed, for knee or back pain 30 tablet 1  . metFORMIN (GLUCOPHAGE) 500 MG tablet Take 1 tablet (500 mg total) by mouth daily. 90 tablet 3  . phentermine (ADIPEX-P) 37.5 MG tablet Take 1 tablet (37.5 mg total) by mouth daily before breakfast. 30 tablet 1  . [START ON 11/09/2018] sertraline  (ZOLOFT) 100 MG tablet Take 1.5 tablets (150 mg total) by mouth daily. 135 tablet 0  . Vitamin D, Ergocalciferol, (DRISDOL) 1.25 MG (50000 UT) CAPS capsule TAKE 1 CAPSULE (50,000 UNITS TOTAL) BY MOUTH ONCE A WEEK. ONE CAPSULE ONCE WEEKLY 12 capsule 3   No current facility-administered medications for this visit.      Musculoskeletal: Strength & Muscle Tone: N/A Gait & Station: N/A Patient leans: N/A  Psychiatric Specialty Exam: Review of Systems  Psychiatric/Behavioral: Negative for depression, hallucinations, memory loss, substance abuse and suicidal ideas. The patient is nervous/anxious. The patient does not have insomnia.   All other systems reviewed and are negative.   There were no vitals taken for this visit.There is no height or weight on file to calculate BMI.  General Appearance: Fairly Groomed  Eye Contact:  Good  Speech:  Clear and Coherent  Volume:  Normal  Mood:  "better"  Affect:  Appropriate, Congruent and calm  Thought Process:  Coherent  Orientation:  Full (Time, Place, and Person)  Thought Content: Logical   Suicidal Thoughts:  No  Homicidal Thoughts:  No  Memory:  Immediate;   Good  Judgement:  Good  Insight:  Fair  Psychomotor Activity:  Normal  Concentration:  Concentration: Good and Attention Span: Good  Recall:  Good  Fund of Knowledge: Good  Language: Good  Akathisia:  No  Handed:  Right  AIMS (if indicated): not done  Assets:  Communication Skills Desire for Improvement  ADL's:  Intact  Cognition: WNL  Sleep:  Good   Screenings: AIMS     [facility]  AIMS Total Score  0    AUDIT     Ad[facility]  Alcohol Use Disorder Identification Test Final Score (AUDIT)  0  GAD-7     Virtual BH Phone Follow Up from 08/26/2017 in [facility] Virtual BH Visit from 08/14/2017 in [facility]  Total GAD-7 Score  7  14    PHQ2-9     Office Visit from 04/22/2018 in [facility] Clinical Support from 01/01/2018 in [facility] Office Visit from 12/18/2017 in [facility] Counselor from [facility] Phone Follow Up from 08/26/2017 in [facility]  PHQ-2 Total Score  0  1  1  3  3   PHQ-9 Total Score  2  -  -  6  8       Assessment and Plan:  [name] is a 58 y.o. year old female with a history of depression,  hypertension, obesity, who presents for follow up appointment for Recurrent major depressive disorder, in full remission (HCC)  # MDD, recurrent in full remission There has been steady improvement in depression, and she denies any significant mood episode except occasional anxiety.  Noted that patient did have at least a few times of depressive episodes in the past.  Provided psycho education to recommend patient to stay on current antidepressant without tapering it off in the future to avoid relapse.  She is in agreement with plan.  Discussed behavioral activation, and sleep hygiene.   Plan I have reviewed and updated plans as below 1. Continue sertraline 150 mg daily  2. Continue hydroxyzine 50 mg daily prn for anxiety 3. Next appointment: 9/21 at 9 AM for 20 mins, video  The patient demonstrates the following risk factors for suicide: Chronic risk factors for suicide include:psychiatric disorder ofdepressionand completed suicide in a family member. Acute risk factorsfor suicide include: loss (financial, interpersonal, professional) and recent discharge from inpatient psychiatry. Protective factorsfor this patient include: positive social support, responsibility to others (children, family), coping skills and hope for the future. Considering these factors, the overall suicide risk at this point appears to below. Patientisappropriate for  outpatient follow up.  Neysa Hotter, MD 08/11/2018, 11:23 AM

## 2018-08-07 DIAGNOSIS — Z6841 Body Mass Index (BMI) 40.0 and over, adult: Secondary | ICD-10-CM | POA: Diagnosis not present

## 2018-08-07 DIAGNOSIS — I10 Essential (primary) hypertension: Secondary | ICD-10-CM | POA: Diagnosis not present

## 2018-08-11 ENCOUNTER — Ambulatory Visit (INDEPENDENT_AMBULATORY_CARE_PROVIDER_SITE_OTHER): Payer: Medicare HMO | Admitting: Psychiatry

## 2018-08-11 ENCOUNTER — Other Ambulatory Visit: Payer: Self-pay

## 2018-08-11 ENCOUNTER — Encounter (HOSPITAL_COMMUNITY): Payer: Self-pay | Admitting: Psychiatry

## 2018-08-11 DIAGNOSIS — R69 Illness, unspecified: Secondary | ICD-10-CM | POA: Diagnosis not present

## 2018-08-11 DIAGNOSIS — F3342 Major depressive disorder, recurrent, in full remission: Secondary | ICD-10-CM

## 2018-08-11 MED ORDER — SERTRALINE HCL 100 MG PO TABS: 150.0000 mg | ORAL_TABLET | Freq: Every day | ORAL | 0 refills | Status: DC

## 2018-08-11 NOTE — Patient Instructions (Signed)
1. Continue sertraline 150 mg daily  2. Continue hydroxyzine 50 mg daily prn for anxiety 3. Next appointment: 9/21 at 9 AM

## 2018-08-12 ENCOUNTER — Other Ambulatory Visit: Payer: Self-pay | Admitting: Family Medicine

## 2018-08-21 DIAGNOSIS — Z6841 Body Mass Index (BMI) 40.0 and over, adult: Secondary | ICD-10-CM | POA: Diagnosis not present

## 2018-08-21 DIAGNOSIS — I10 Essential (primary) hypertension: Secondary | ICD-10-CM | POA: Diagnosis not present

## 2018-09-09 ENCOUNTER — Other Ambulatory Visit: Payer: Self-pay

## 2018-09-09 ENCOUNTER — Ambulatory Visit (INDEPENDENT_AMBULATORY_CARE_PROVIDER_SITE_OTHER): Payer: Medicare HMO | Admitting: Family Medicine

## 2018-09-09 ENCOUNTER — Encounter: Payer: Self-pay | Admitting: Family Medicine

## 2018-09-09 VITALS — BP 118/80 | HR 64 | Temp 98.5°F | Resp 14 | Ht 61.0 in | Wt 324.0 lb

## 2018-09-09 DIAGNOSIS — I10 Essential (primary) hypertension: Secondary | ICD-10-CM | POA: Diagnosis not present

## 2018-09-09 DIAGNOSIS — R7303 Prediabetes: Secondary | ICD-10-CM

## 2018-09-09 DIAGNOSIS — E559 Vitamin D deficiency, unspecified: Secondary | ICD-10-CM | POA: Diagnosis not present

## 2018-09-09 DIAGNOSIS — R69 Illness, unspecified: Secondary | ICD-10-CM | POA: Diagnosis not present

## 2018-09-09 DIAGNOSIS — R7301 Impaired fasting glucose: Secondary | ICD-10-CM | POA: Diagnosis not present

## 2018-09-09 DIAGNOSIS — F331 Major depressive disorder, recurrent, moderate: Secondary | ICD-10-CM

## 2018-09-09 MED ORDER — PHENTERMINE HCL 37.5 MG PO TABS: 37.5000 mg | ORAL_TABLET | Freq: Every day | ORAL | 3 refills | Status: DC

## 2018-09-09 NOTE — Patient Instructions (Signed)
F/u in 4 months with mD, call if you need me sooner.  CONGRATS on excellent weight loss, goal is 4 pounds per month   PLEASE stop juice, eat the fruit  Lab today, CBC,vit D, hBA1C , chem 7 anfd EgFr   Social distancing. Frequent hand washing with soap and water Keeping your hands off of your face. These 3 practices will help to keep both you and your community healthy during this time. Please practice them faithfully!  Thanks for choosing [facility], we consider it a privelige to serve you.

## 2018-09-10 ENCOUNTER — Other Ambulatory Visit: Payer: Self-pay | Admitting: Family Medicine

## 2018-09-10 ENCOUNTER — Encounter: Payer: Self-pay | Admitting: Family Medicine

## 2018-09-10 LAB — CBC
HCT: 37.3 % (ref 35.0–45.0)
Hemoglobin: 11.6 g/dL — ABNORMAL LOW (ref 11.7–15.5)
MCH: 23.2 pg — ABNORMAL LOW (ref 27.0–33.0)
MCHC: 31.1 g/dL — ABNORMAL LOW (ref 32.0–36.0)
MCV: 74.5 fL — ABNORMAL LOW (ref 80.0–100.0)
MPV: 10.3 fL (ref 7.5–12.5)
Platelets: 424 10*3/uL — ABNORMAL HIGH (ref 140–400)
RBC: 5.01 10*6/uL (ref 3.80–5.10)
RDW: 14.2 % (ref 11.0–15.0)
WBC: 7.7 10*3/uL (ref 3.8–10.8)

## 2018-09-10 LAB — BASIC METABOLIC PANEL WITH GFR
BUN: 17 mg/dL (ref 7–25)
CO2: 28 mmol/L (ref 20–32)
Calcium: 9.2 mg/dL (ref 8.6–10.4)
Chloride: 100 mmol/L (ref 98–110)
Creat: 0.76 mg/dL (ref 0.50–1.05)
GFR, Est African American: 102 mL/min/{1.73_m2} (ref >=60)
GFR, Est Non African American: 88 mL/min/{1.73_m2} (ref >=60)
Glucose, Bld: 97 mg/dL (ref 65–139)
Potassium: 4 mmol/L (ref 3.5–5.3)
Sodium: 138 mmol/L (ref 135–146)

## 2018-09-10 LAB — HEMOGLOBIN A1C
Hgb A1c MFr Bld: 6.2 % of total Hgb — ABNORMAL HIGH (ref <5.7)
Mean Plasma Glucose: 131 (calc)
eAG (mmol/L): 7.3 (calc)

## 2018-09-10 LAB — VITAMIN D 25 HYDROXY (VIT D DEFICIENCY, FRACTURES): Vit D, 25-Hydroxy: 45 ng/mL (ref 30–100)

## 2018-09-14 ENCOUNTER — Encounter: Payer: Self-pay | Admitting: Family Medicine

## 2018-09-14 NOTE — Assessment & Plan Note (Signed)
  Patient re-educated about  the importance of commitment to a  minimum of 150 minutes of exercise per week as able.  The importance of healthy food choices with portion control discussed, as well as eating regularly and within a 12 hour window most days. The need to choose "clean , green" food 50 to 75% of the time is discussed, as well as to make water the primary drink and set a goal of 64 ounces water daily.    Weight /BMI 09/09/2018 04/22/2018 03/09/2018  WEIGHT 324 lb 344 lb 353 lb 12.8 oz  HEIGHT 5\' 1"  5\' 1"  5\' 1"   BMI 61.22 kg/m2 65 kg/m2 66.85 kg/m2  Some encounter information is confidential and restricted. Go to Review Flowsheets activity to see all data.

## 2018-09-14 NOTE — Assessment & Plan Note (Signed)
Patient educated about the importance of limiting  Carbohydrate intake , the need to commit to daily physical activity for a minimum of 30 minutes , and to commit weight loss. The fact that changes in all these areas will reduce or eliminate all together the development of diabetes is stressed.  Deteriorated , needs to reduce sugar and starch  Diabetic Labs Latest Ref Rng & Units 09/09/2018 04/14/2018 08/26/2017 08/14/2017 04/11/2017  HbA1c <5.7 % of total Hgb 6.2(H) 6.1(H) 6.1(H) - 6.0(H)  Microalbumin Not estab mg/dL - - - - -  Micro/Creat Ratio <30 mcg/mg creat - - - - -  Chol <200 mg/dL - 147 - - 124  HDL >50 mg/dL - 44(L) - - 51  Calc LDL mg/dL (calc) - 84 - - 60  Triglycerides <150 mg/dL - 94 - - 52  Creatinine 0.50 - 1.05 mg/dL 0.76 0.81 - 0.72 0.71   BP/Weight 09/09/2018 04/22/2018 03/09/2018 02/02/2018 01/25/2018 01/01/2018 44/11/6757  Systolic BP 163 846 [phone] 390  Diastolic BP 80 80 77 84 88 80 66  Wt. (Lbs) 324 344 353.8 345 348 368 360  BMI 61.22 65 66.85 63.1 63.65 67.31 65.84  Some encounter information is confidential and restricted. Go to Review Flowsheets activity to see all data.   Foot/eye exam completion dates 07/31/2015  Foot Form Completion Done

## 2018-09-14 NOTE — Assessment & Plan Note (Signed)
Controlled, no change in medication DASH diet and commitment to daily physical activity for a minimum of 30 minutes discussed and encouraged, as a part of hypertension management. The importance of attaining a healthy weight is also discussed.  BP/Weight 09/09/2018 04/22/2018 03/09/2018 02/02/2018 01/25/2018 01/01/2018 08/17/3760  Systolic BP 831 517 616 [phone]  Diastolic BP 80 80 77 84 88 80 66  Wt. (Lbs) 324 344 353.8 345 348 368 360  BMI 61.22 65 66.85 63.1 63.65 67.31 65.84  Some encounter information is confidential and restricted. Go to Review Flowsheets activity to see all data.

## 2018-09-14 NOTE — Progress Notes (Signed)
DRESDEN AMENT     MRN: [id number]      DOB: [birth year]   HPI Ms. Havel is here for follow up and re-evaluation of chronic medical conditions, medication management and review of any available recent lab and radiology data.  Preventive health is updated, specifically  Cancer screening and Immunization.   Questions or concerns regarding consultations or procedures which the PT has had in the interim are  addressed. The PT denies any adverse reactions to current medications since the last visit.  Doing very well with both weight loss and depression management , feels good, planes to increase exercise commitment   ROS Denies recent fever or chills. Denies sinus pressure, nasal congestion, ear pain or sore throat. Denies chest congestion, productive cough or wheezing. Denies chest pains, palpitations and leg swelling Denies abdominal pain, nausea, vomiting,diarrhea or constipation.   Denies dysuria, frequency, hesitancy or incontinence. Denies joint pain, swelling and limitation in mobility. Denies headaches, seizures, numbness, or tingling. Denies uncontrolled  depression, anxiety or insomnia. Denies skin break down or rash.   PE  BP 118/80   Pulse 64   Temp 98.5 F (36.9 C) (Temporal)   Resp 14   Ht 5\' 1"  (1.549 m)   Wt (!) 324 lb (147 kg)   SpO2 96%   BMI 61.22 kg/m   Patient alert and oriented and in no cardiopulmonary distress.  HEENT: No facial asymmetry, EOMI,   oropharynx pink and moist.  Neck supple no JVD, no mass.  Chest: Clear to auscultation bilaterally.  CVS: S1, S2 no murmurs, no S3.Regular rate.  ABD: Soft non tender.   Ext: No edema  MS: Adequate ROM spine, shoulders, hips and knees.  Skin: Intact, no ulcerations or rash noted.  Psych: Good eye contact, normal affect. Memory intact not anxious or depressed appearing.  CNS: CN 2-12 intact, power,  normal throughout.no focal deficits noted.   Assessment & Plan  Essential hypertension  Controlled, no change in medication DASH diet and commitment to daily physical activity for a minimum of 30 minutes discussed and encouraged, as a part of hypertension management. The importance of attaining a healthy weight is also discussed.  BP/Weight 09/09/2018 04/22/2018 03/09/2018 02/02/2018 01/25/2018 01/01/2018 93/10/1827  Systolic BP 937 169 678 938 101 751 025  Diastolic BP 80 80 77 84 88 80 66  Wt. (Lbs) 324 344 353.8 345 348 368 360  BMI 61.22 65 66.85 63.1 63.65 67.31 65.84  Some encounter information is confidential and restricted. Go to Review Flowsheets activity to see all data.       Morbid obesity  Patient re-educated about  the importance of commitment to a  minimum of 150 minutes of exercise per week as able.  The importance of healthy food choices with portion control discussed, as well as eating regularly and within a 12 hour window most days. The need to choose "clean , green" food 50 to 75% of the time is discussed, as well as to make water the primary drink and set a goal of 64 ounces water daily.    Weight /BMI 09/09/2018 04/22/2018 03/09/2018  WEIGHT 324 lb 344 lb 353 lb 12.8 oz  HEIGHT 5\' 1"  5\' 1"  5\' 1"   BMI 61.22 kg/m2 65 kg/m2 66.85 kg/m2  Some encounter information is confidential and restricted. Go to Review Flowsheets activity to see all data.      Prediabetes Patient educated about the importance of limiting  Carbohydrate intake , the need to commit to daily  physical activity for a minimum of 30 minutes , and to commit weight loss. The fact that changes in all these areas will reduce or eliminate all together the development of diabetes is stressed.  Deteriorated , needs to reduce sugar and starch  Diabetic Labs Latest Ref Rng & Units 09/09/2018 04/14/2018 08/26/2017 08/14/2017 04/11/2017  HbA1c <5.7 % of total Hgb 6.2(H) 6.1(H) 6.1(H) - 6.0(H)  Microalbumin Not estab mg/dL - - - - -  Micro/Creat Ratio <30 mcg/mg creat - - - - -  Chol <200 mg/dL - 917 -  - 915  HDL >05 mg/dL - 69(V) - - 51  Calc LDL mg/dL (calc) - 84 - - 60  Triglycerides <150 mg/dL - 94 - - 52  Creatinine 0.50 - 1.05 mg/dL 9.48 0.16 - 5.53 7.48   BP/Weight 09/09/2018 04/22/2018 03/09/2018 02/02/2018 01/25/2018 01/01/2018 12/18/2017  Systolic BP 118 118 115 120 149 139 122  Diastolic BP 80 80 77 84 88 80 66  Wt. (Lbs) 324 344 353.8 345 348 368 360  BMI 61.22 65 66.85 63.1 63.65 67.31 65.84  Some encounter information is confidential and restricted. Go to Review Flowsheets activity to see all data.   Foot/eye exam completion dates 07/31/2015  Foot Form Completion Done      Major depressive disorder, recurrent episode, moderate (HCC) Improved and adequately managed by Psychiartry

## 2018-09-14 NOTE — Assessment & Plan Note (Signed)
Improved and adequately managed by Psychiartry

## 2018-09-17 DIAGNOSIS — R5383 Other fatigue: Secondary | ICD-10-CM | POA: Diagnosis not present

## 2018-09-17 DIAGNOSIS — I10 Essential (primary) hypertension: Secondary | ICD-10-CM | POA: Diagnosis not present

## 2018-09-17 DIAGNOSIS — Z6841 Body Mass Index (BMI) 40.0 and over, adult: Secondary | ICD-10-CM | POA: Diagnosis not present

## 2018-10-16 ENCOUNTER — Other Ambulatory Visit: Payer: Self-pay | Admitting: Family Medicine

## 2018-10-20 ENCOUNTER — Ambulatory Visit (INDEPENDENT_AMBULATORY_CARE_PROVIDER_SITE_OTHER): Payer: Medicare HMO | Admitting: Family Medicine

## 2018-10-20 ENCOUNTER — Other Ambulatory Visit: Payer: Self-pay

## 2018-10-20 ENCOUNTER — Encounter: Payer: Self-pay | Admitting: Family Medicine

## 2018-10-20 VITALS — BP 127/87 | HR 66 | Resp 12 | Ht 61.0 in | Wt 324.0 lb

## 2018-10-20 DIAGNOSIS — I10 Essential (primary) hypertension: Secondary | ICD-10-CM

## 2018-10-20 DIAGNOSIS — M2550 Pain in unspecified joint: Secondary | ICD-10-CM

## 2018-10-20 DIAGNOSIS — G43111 Migraine with aura, intractable, with status migrainosus: Secondary | ICD-10-CM

## 2018-10-20 DIAGNOSIS — R110 Nausea: Secondary | ICD-10-CM | POA: Insufficient documentation

## 2018-10-20 MED ORDER — ONDANSETRON HCL 4 MG/2ML IJ SOLN
4.0000 mg | Freq: Once | INTRAMUSCULAR | Status: AC
  Administered 2018-10-20: 4 mg via INTRAMUSCULAR

## 2018-10-20 MED ORDER — KETOROLAC TROMETHAMINE 60 MG/2ML IM SOLN
60.0000 mg | Freq: Once | INTRAMUSCULAR | Status: AC
  Administered 2018-10-20: 60 mg via INTRAMUSCULAR

## 2018-10-20 MED ORDER — HYDROXYZINE HCL 50 MG PO TABS: ORAL_TABLET | ORAL | 4 refills | Status: DC

## 2018-10-20 MED ORDER — PANTOPRAZOLE SODIUM 40 MG PO TBEC: 40.0000 mg | DELAYED_RELEASE_TABLET | Freq: Every day | ORAL | 0 refills | Status: DC

## 2018-10-20 MED ORDER — PREDNISONE 20 MG PO TABS: ORAL_TABLET | ORAL | 0 refills | Status: DC

## 2018-10-20 MED ORDER — METHYLPREDNISOLONE ACETATE 80 MG/ML IJ SUSP
80.0000 mg | Freq: Once | INTRAMUSCULAR | Status: AC
  Administered 2018-10-20: 16:00:00 80 mg via INTRAMUSCULAR

## 2018-10-20 MED ORDER — ONDANSETRON HCL 4 MG PO TABS: 4.0000 mg | ORAL_TABLET | Freq: Three times a day (TID) | ORAL | 0 refills | Status: DC | PRN

## 2018-10-20 NOTE — Patient Instructions (Addendum)
F/U as before , call if you need me sooner  Labs today, chem 7 and EGFR, eSR and rheumatoid factor   You are receiving Toradol, depo medrol and Zofran in the office today for headache, generalized joint pain and nausea.  Prednisone is prescribed, take as directed, pleae,, also protonix to protect your stomach and zofran for as needed use  You are referred to Rheumatology, that office will call you.  Thanks for choosing [facility], we consider it a privelige to serve you. Hope you feel better soon

## 2018-10-20 NOTE — Assessment & Plan Note (Signed)
Zofran 4 mg IM in office 

## 2018-10-20 NOTE — Assessment & Plan Note (Addendum)
Uncontrolled.Toradoland depo medrol.followed by short course of prednisone

## 2018-10-20 NOTE — Assessment & Plan Note (Signed)
Severe and uncontrolled x 1 month, toradol 60 mg and depo medrol 80 mg IM in office, prednisone taper. ESR, rheumatoid factor , rheumatology referral

## 2018-10-20 NOTE — Assessment & Plan Note (Signed)
Controlled, no change in medication DASH diet and commitment to daily physical activity for a minimum of 30 minutes discussed and encouraged, as a part of hypertension management. The importance of attaining a healthy weight is also discussed.  BP/Weight 10/20/2018 09/09/2018 04/22/2018 03/09/2018 02/02/2018 01/25/2018 66/81/5947  Systolic BP 076 151 834 373 578 978 478  Diastolic BP 87 80 80 77 84 88 80  Wt. (Lbs) 324 324 344 353.8 345 348 368  BMI 61.22 61.22 65 66.85 63.1 63.65 67.31  Some encounter information is confidential and restricted. Go to Review Flowsheets activity to see all data.

## 2018-10-20 NOTE — Progress Notes (Signed)
   [name]     MRN: [id number]      DOB: [birth year]   HPI [name] is here with a `1 month h/o generalized joint pain and and stiffness affects all joints except left ankle, wrists and hips, no fever or chills but new fatigue Tender points multiple Bilateral temoporal with nausea rated at 7  And joint pain an 8 ROS Denies recent fever or chills. Denies sinus pressure, nasal congestion, ear pain or sore throat. Denies chest congestion, productive cough or wheezing. Denies chest pains, palpitations and leg swelling Denies abdominal pain, nausea, vomiting,diarrhea or constipation.   Denies dysuria, frequency, hesitancy or incontinence.  Denies  seizures, numbness, or tingling. Denies uncontrolled  depression, anxiety or insomnia. Denies skin break down or rash.   PE  BP 127/87   Pulse 66   Resp 12   Ht '5\' 1"'$  (1.549 m)   Wt (!) 324 lb (147 kg)   SpO2 96%   BMI 61.22 kg/m   Patient alert and oriented and in no cardiopulmonary distress.Pt in pain  HEENT: No facial asymmetry, EOMI,   oropharynx pink and moist.  Neck supple no JVD, no mass.  Chest: Clear to auscultation bilaterally.  CVS: S1, S2 no murmurs, no S3.Regular rate.  ABD: Soft non tender.   Ext: No edema  MS: Adequate though reduced ROM spine, shoulders, hips and knees. Multiple tender spots, neck, elbows , knees  Skin: Intact, no ulcerations or rash noted.  Psych: Good eye contact, normal affect. Memory intact not anxious or depressed appearing.  CNS: CN 2-12 intact, power,  normal throughout.no focal deficits noted.   Assessment & Plan  Generalized joint pain Severe and uncontrolled x 1 month, toradol 60 mg and depo medrol 80 mg IM in office, prednisone taper. ESR, rheumatoid factor , rheumatology referral  Essential hypertension Controlled, no change in medication DASH diet and commitment to daily physical activity for a minimum of 30 minutes discussed and encouraged, as a part of  hypertension management. The importance of attaining a healthy weight is also discussed.  BP/Weight 10/20/2018 09/09/2018 04/22/2018 03/09/2018 02/02/2018 01/25/2018 37/85/8850  Systolic BP 277 412 878 676 720 947 096  Diastolic BP 87 80 80 77 84 88 80  Wt. (Lbs) 324 324 344 353.8 345 348 368  BMI 61.22 61.22 65 66.85 63.1 63.65 67.31  Some encounter information is confidential and restricted. Go to Review Flowsheets activity to see all data.       Migraine headache Uncontrolled.Toradoland depo medrol.followed by short course of prednisone   Nausea Zofran 4 mg IM in office   Morbid obesity  Patient re-educated about  the importance of commitment to a  minimum of 150 minutes of exercise per week as able.  The importance of healthy food choices with portion control discussed, as well as eating regularly and within a 12 hour window most days. The need to choose "clean , green" food 50 to 75% of the time is discussed, as well as to make water the primary drink and set a goal of 64 ounces water daily.    Weight /BMI 10/20/2018 09/09/2018 04/22/2018  WEIGHT 324 lb 324 lb 344 lb  HEIGHT '5\' 1"'$  '5\' 1"'$  '5\' 1"'$   BMI 61.22 kg/m2 61.22 kg/m2 65 kg/m2  Some encounter information is confidential and restricted. Go to Review Flowsheets activity to see all data.

## 2018-10-22 ENCOUNTER — Encounter: Payer: Self-pay | Admitting: Family Medicine

## 2018-10-22 DIAGNOSIS — M2550 Pain in unspecified joint: Secondary | ICD-10-CM | POA: Diagnosis not present

## 2018-10-22 DIAGNOSIS — I10 Essential (primary) hypertension: Secondary | ICD-10-CM | POA: Diagnosis not present

## 2018-10-23 ENCOUNTER — Encounter: Payer: Self-pay | Admitting: Family Medicine

## 2018-10-23 LAB — BASIC METABOLIC PANEL WITH GFR
BUN: 16 mg/dL (ref 7–25)
CO2: 30 mmol/L (ref 20–32)
Calcium: 8.9 mg/dL (ref 8.6–10.4)
Chloride: 103 mmol/L (ref 98–110)
Creat: 0.65 mg/dL (ref 0.50–1.05)
GFR, Est African American: 116 mL/min/{1.73_m2} (ref >=60)
GFR, Est Non African American: 100 mL/min/{1.73_m2} (ref >=60)
Glucose, Bld: 88 mg/dL (ref 65–139)
Potassium: 4.1 mmol/L (ref 3.5–5.3)
Sodium: 139 mmol/L (ref 135–146)

## 2018-10-23 LAB — RHEUMATOID FACTOR: Rheumatoid fact SerPl-aCnc: <14 IU/mL (ref <14)

## 2018-10-23 LAB — SEDIMENTATION RATE: Sed Rate: 36 mm/h — ABNORMAL HIGH (ref 0–30)

## 2018-10-30 IMAGING — MG DIGITAL SCREENING BILATERAL MAMMOGRAM WITH TOMO AND CAD
8 of 11 series · 8 of 27 positions shown · non-contrast
Comparison: Previous exam(s).

CLINICAL DATA: Screening.

EXAM:
DIGITAL SCREENING BILATERAL MAMMOGRAM WITH TOMO AND CAD

[R CC (1 of 2)]
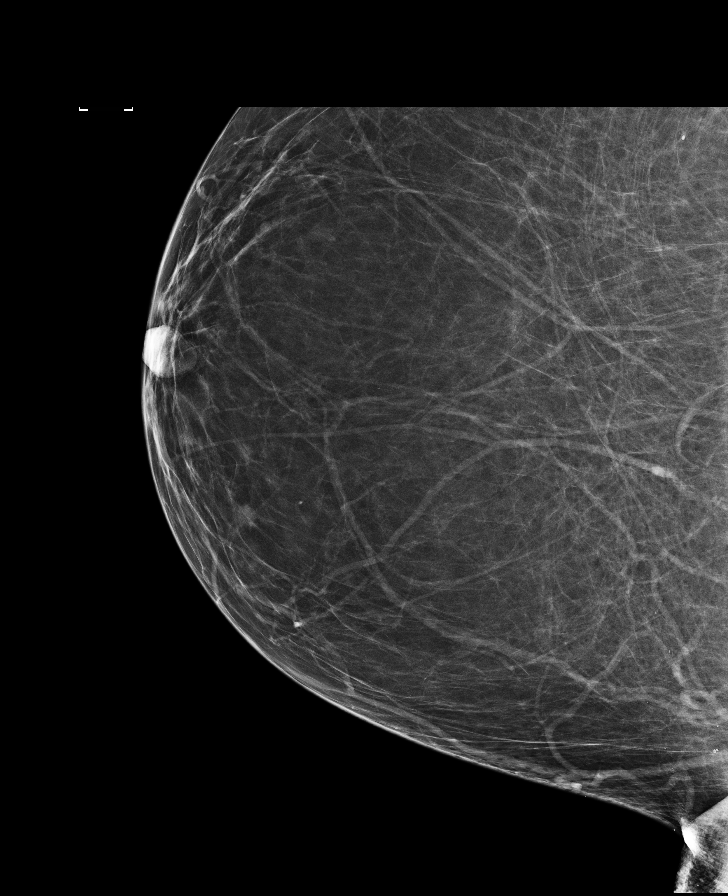

[L MLO (1 of 2)]
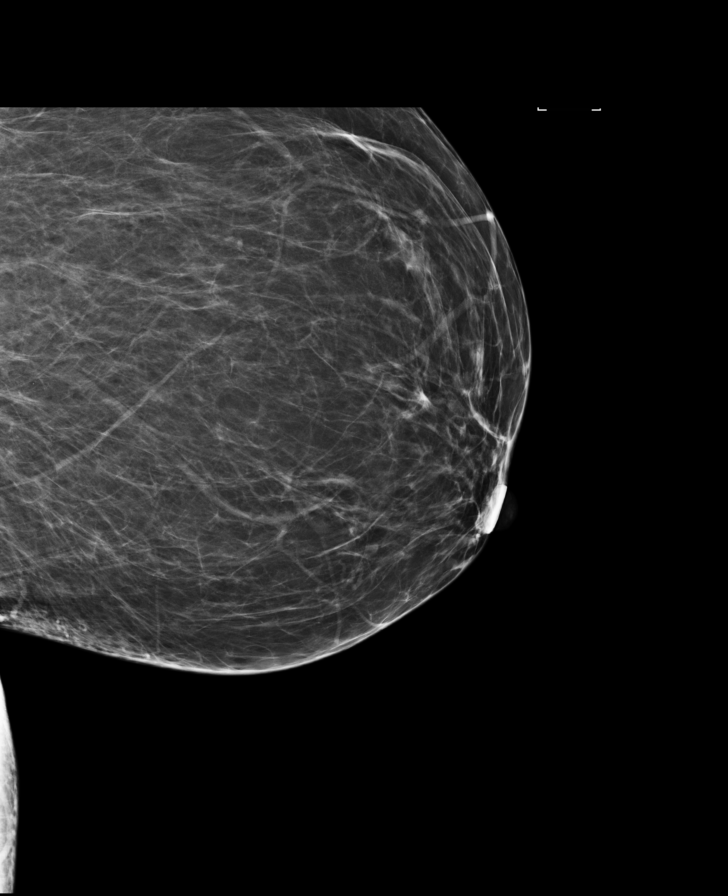

[L CC (1 of 2)]
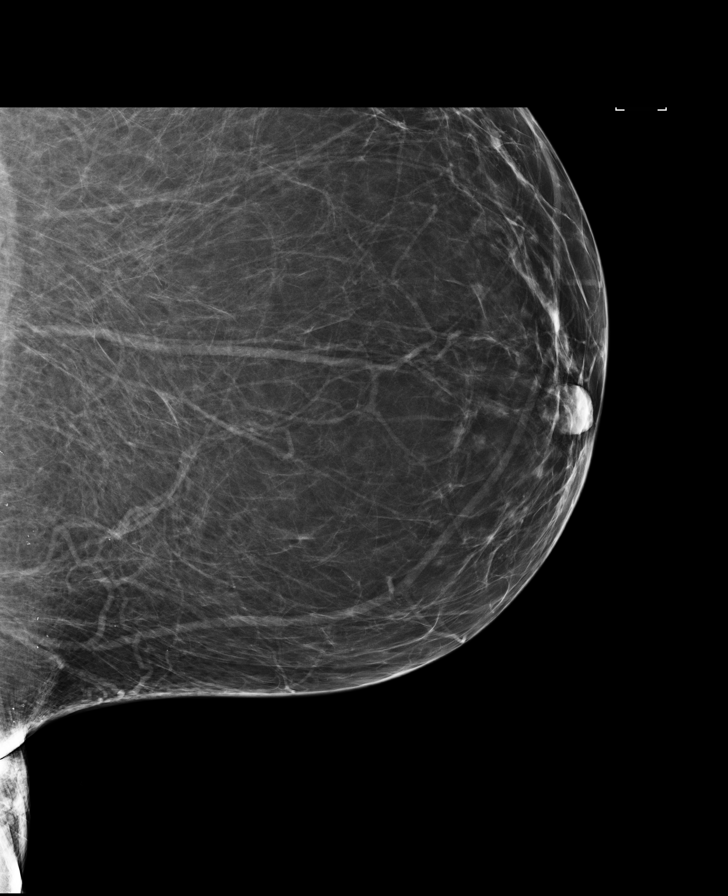

[R CC (2 of 2)]
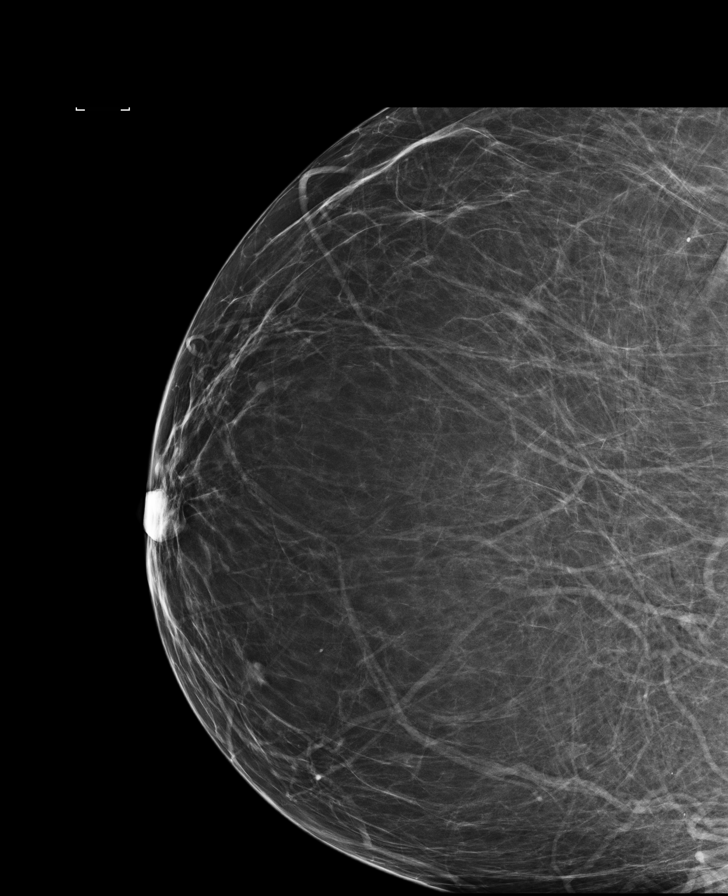

[R MLO]
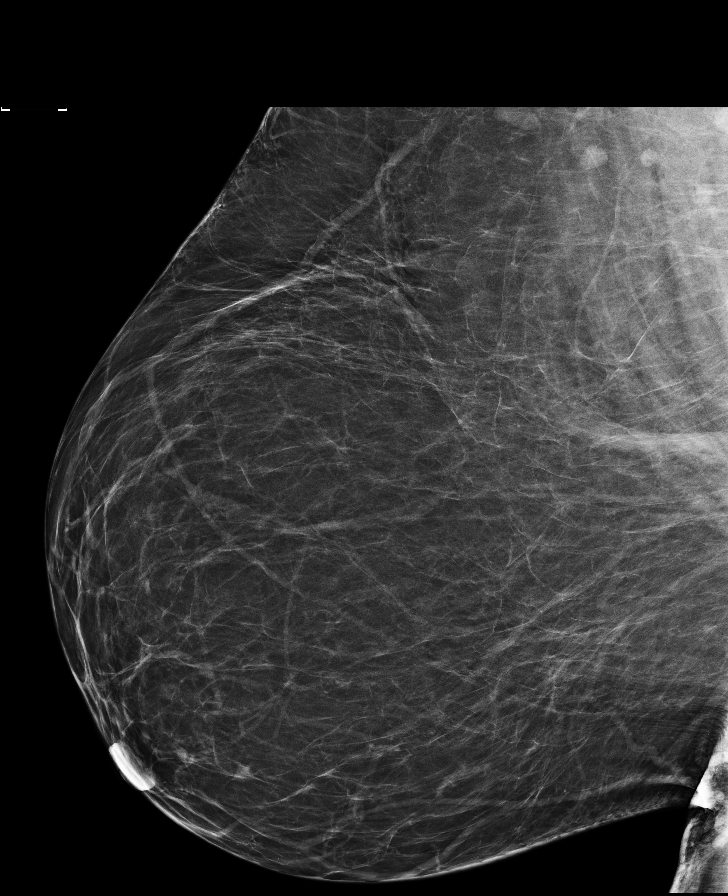

[L CC (2 of 2)]
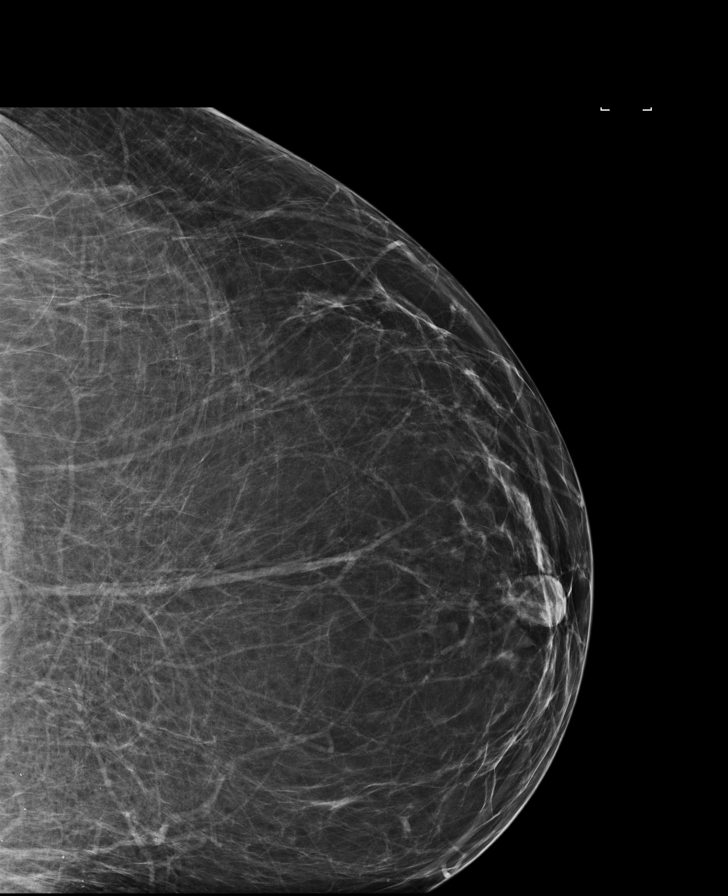

[L MLO (2 of 2)]
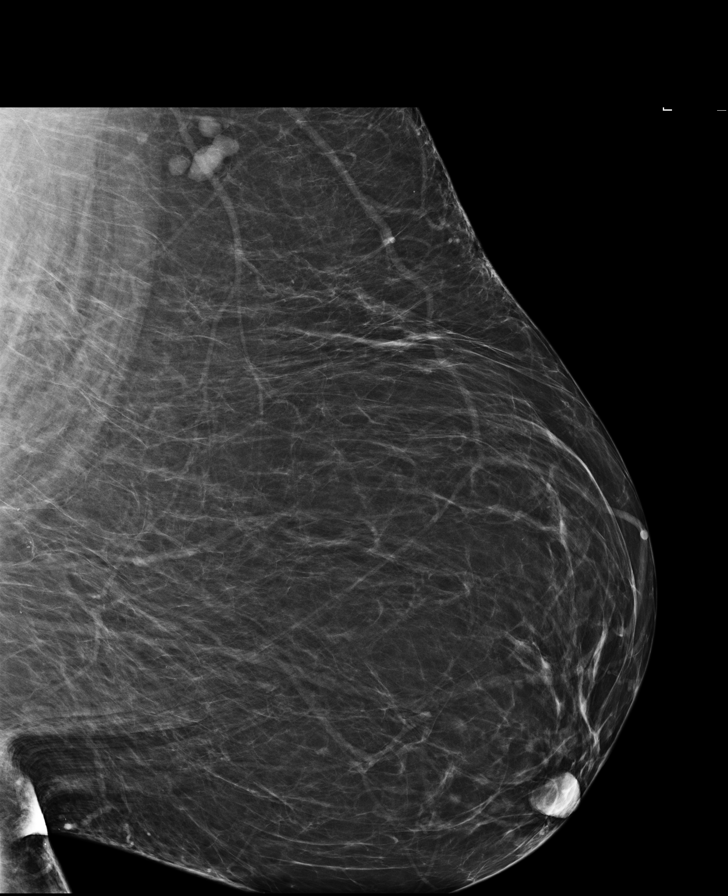

[L CC tomo · tomo slice 45/89.0]
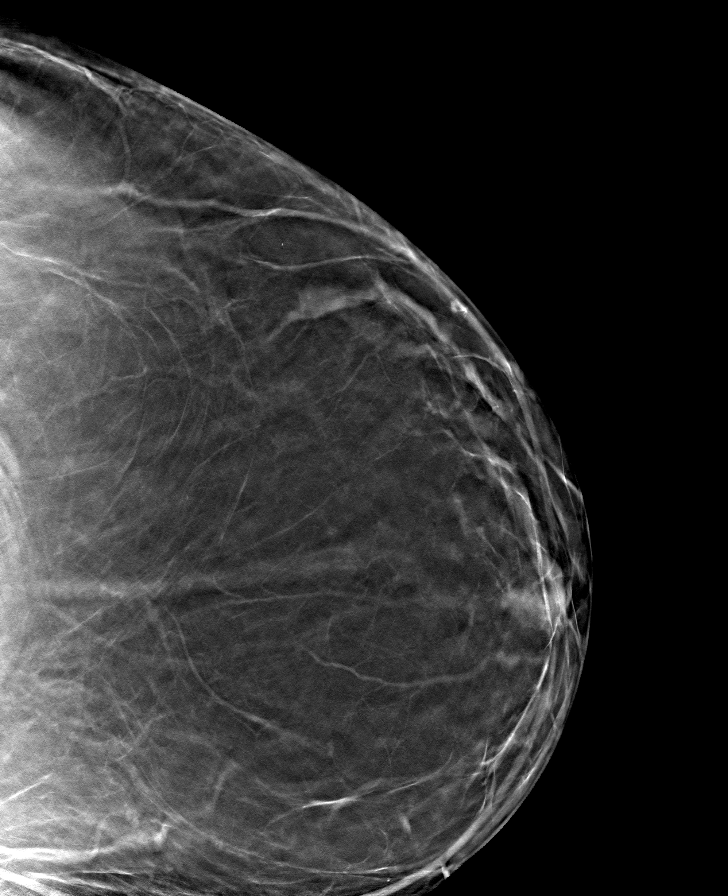

[8 of 27 positions shown; findings below may reference images not displayed]

ACR Breast Density Category b: There are scattered areas of
fibroglandular density.
FINDINGS: In the right breast, a possible mass warrants further evaluation. In
the left breast, no findings suspicious for malignancy. Images were
processed with CAD.
IMPRESSION: Further evaluation is suggested for possible mass in the right
breast.

RECOMMENDATION:
Diagnostic mammogram and possibly ultrasound of the right breast.
(Code:T1-A-550)

The patient will be contacted regarding the findings, and additional
imaging will be scheduled.

BI-RADS CATEGORY  0: Incomplete. Need additional imaging evaluation
and/or prior mammograms for comparison.

## 2018-10-31 IMAGING — MG DIGITAL DIAGNOSTIC UNILATERAL RIGHT MAMMOGRAM WITH TOMO AND CAD
4 series · 4 of 12 positions shown · non-contrast
Comparison: Previous exam(s).

CLINICAL DATA: Screening recall for right breast mass.

EXAM:
DIGITAL DIAGNOSTIC UNILATERAL RIGHT MAMMOGRAM WITH CAD AND TOMO

[R MLO]
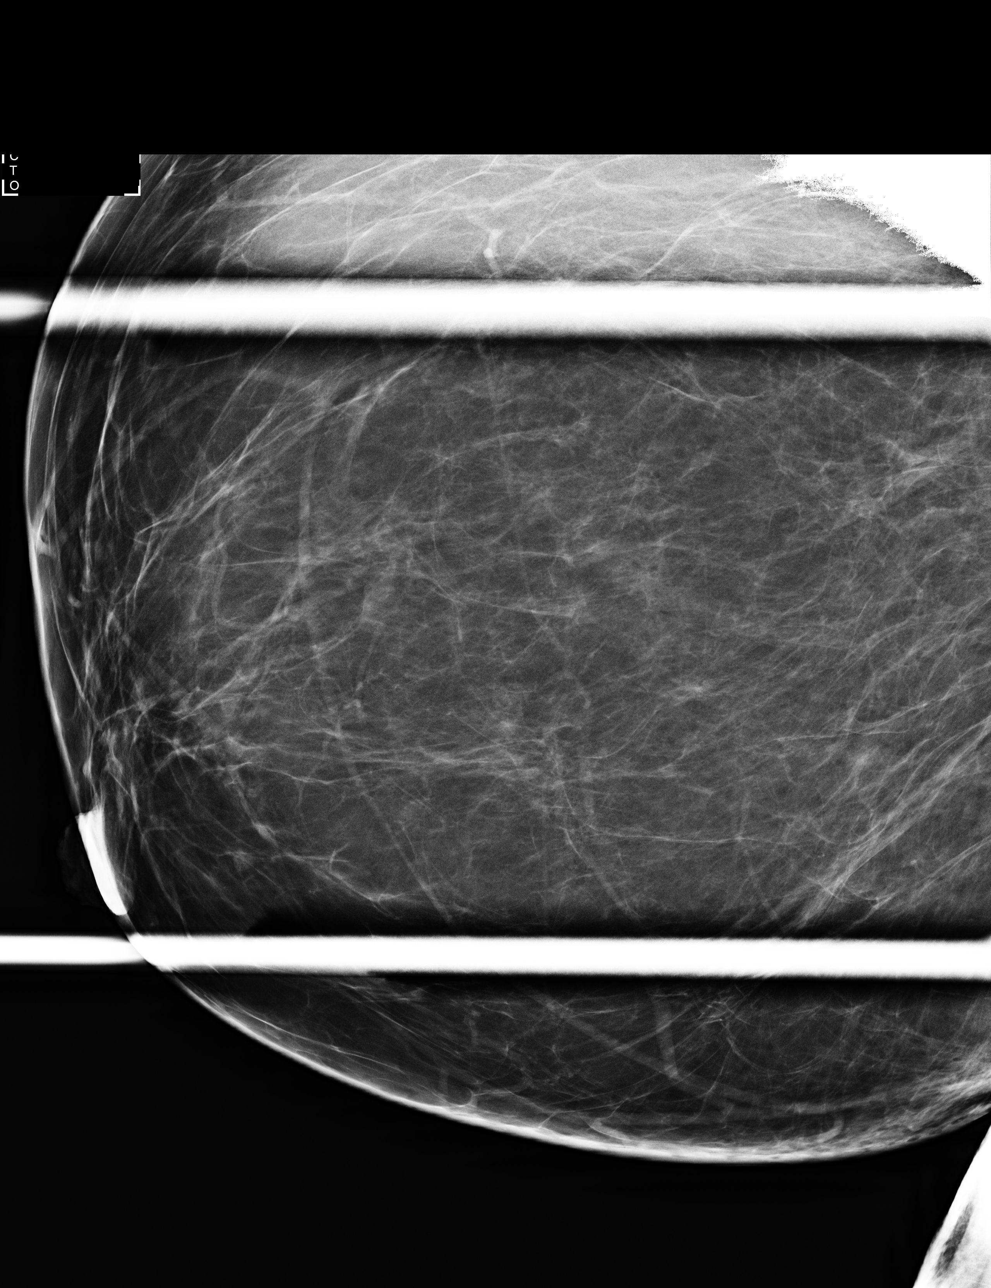

[R CC]
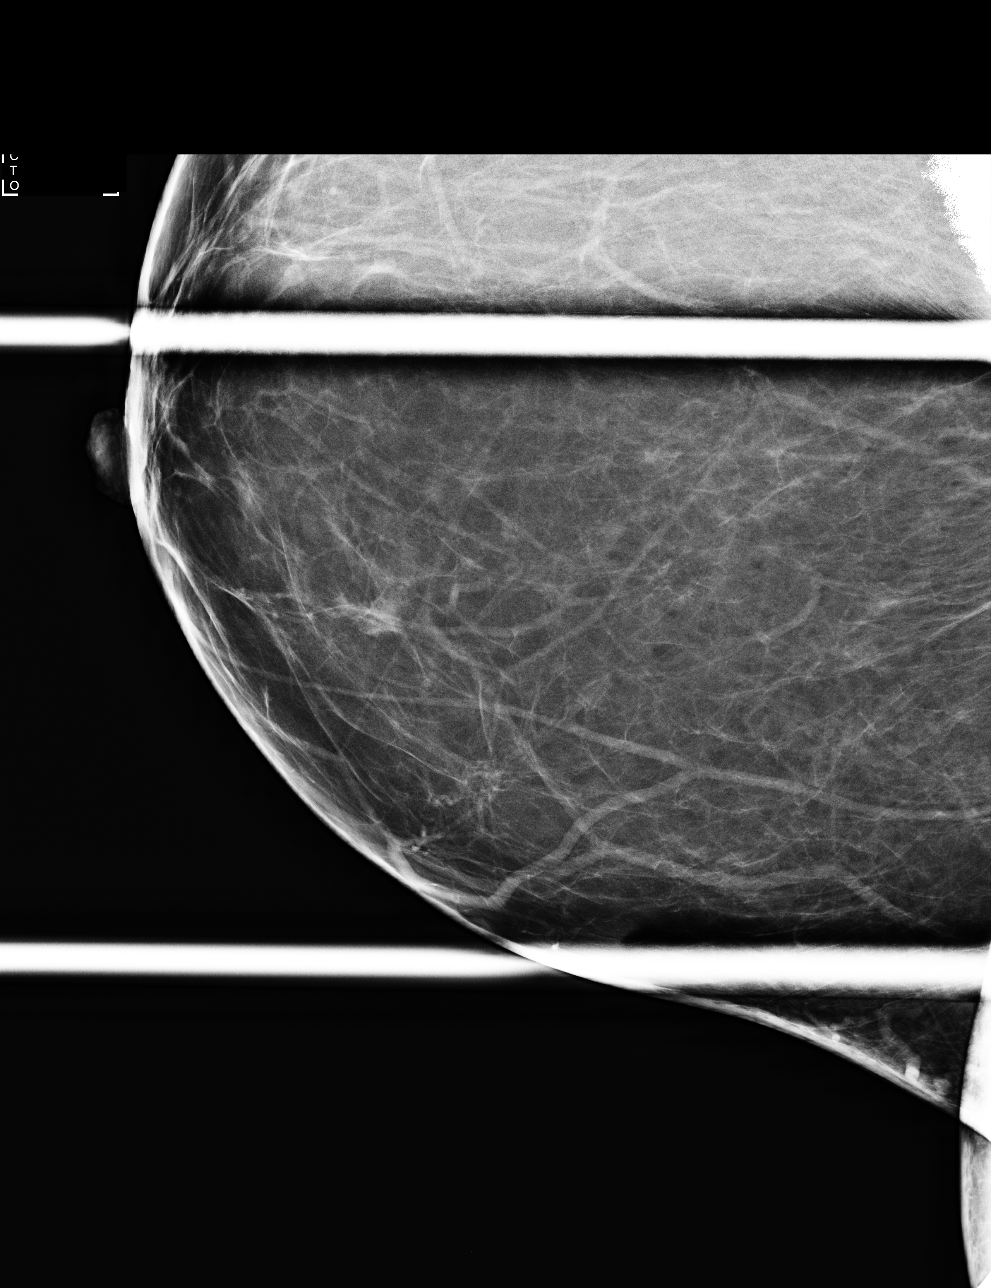

[R MLO tomo · tomo slice 39/76.0]
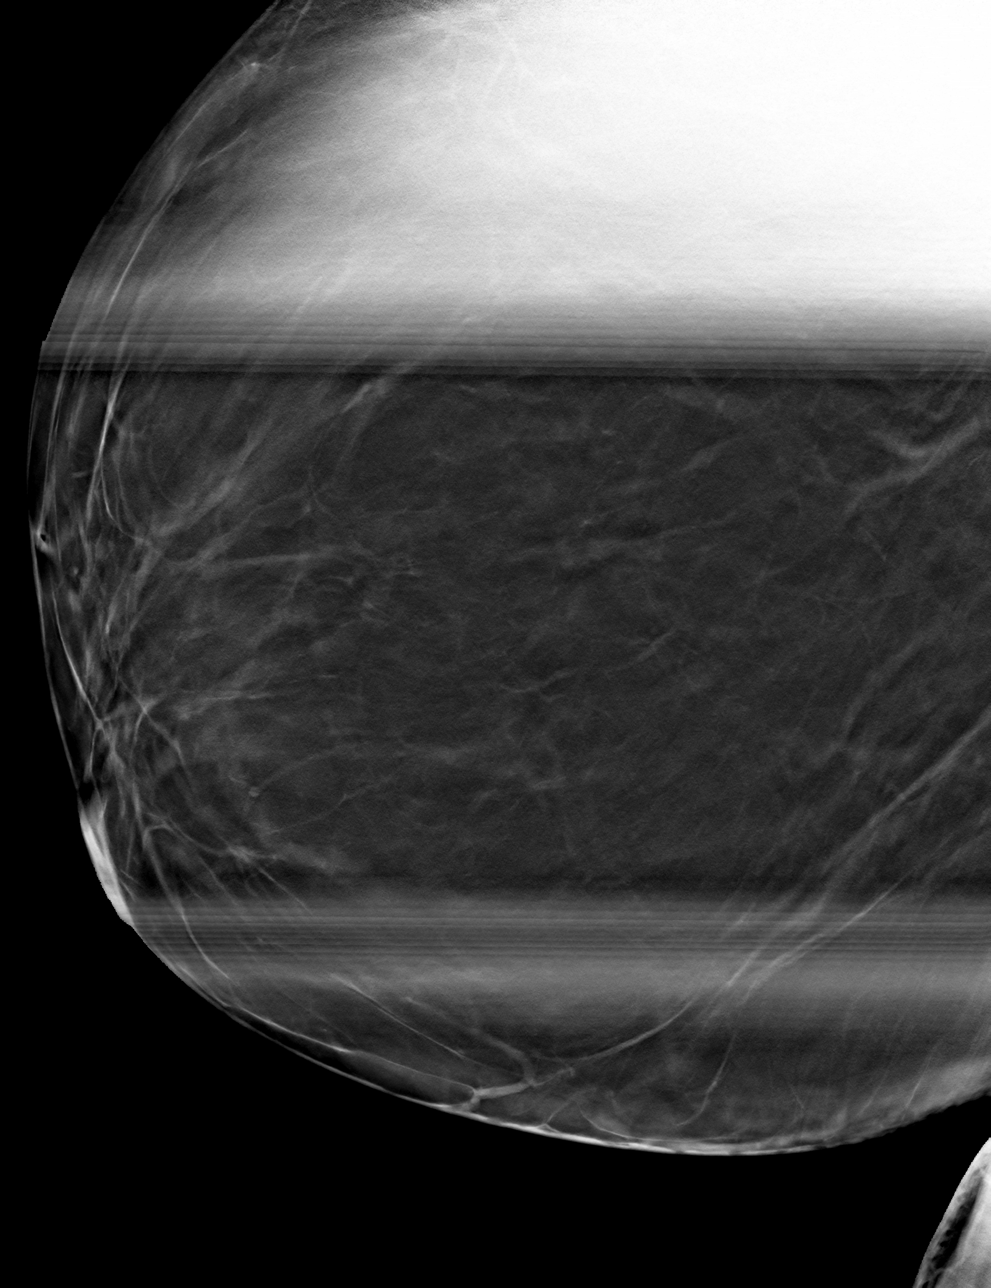

[R CC tomo · tomo slice 31/61.0]
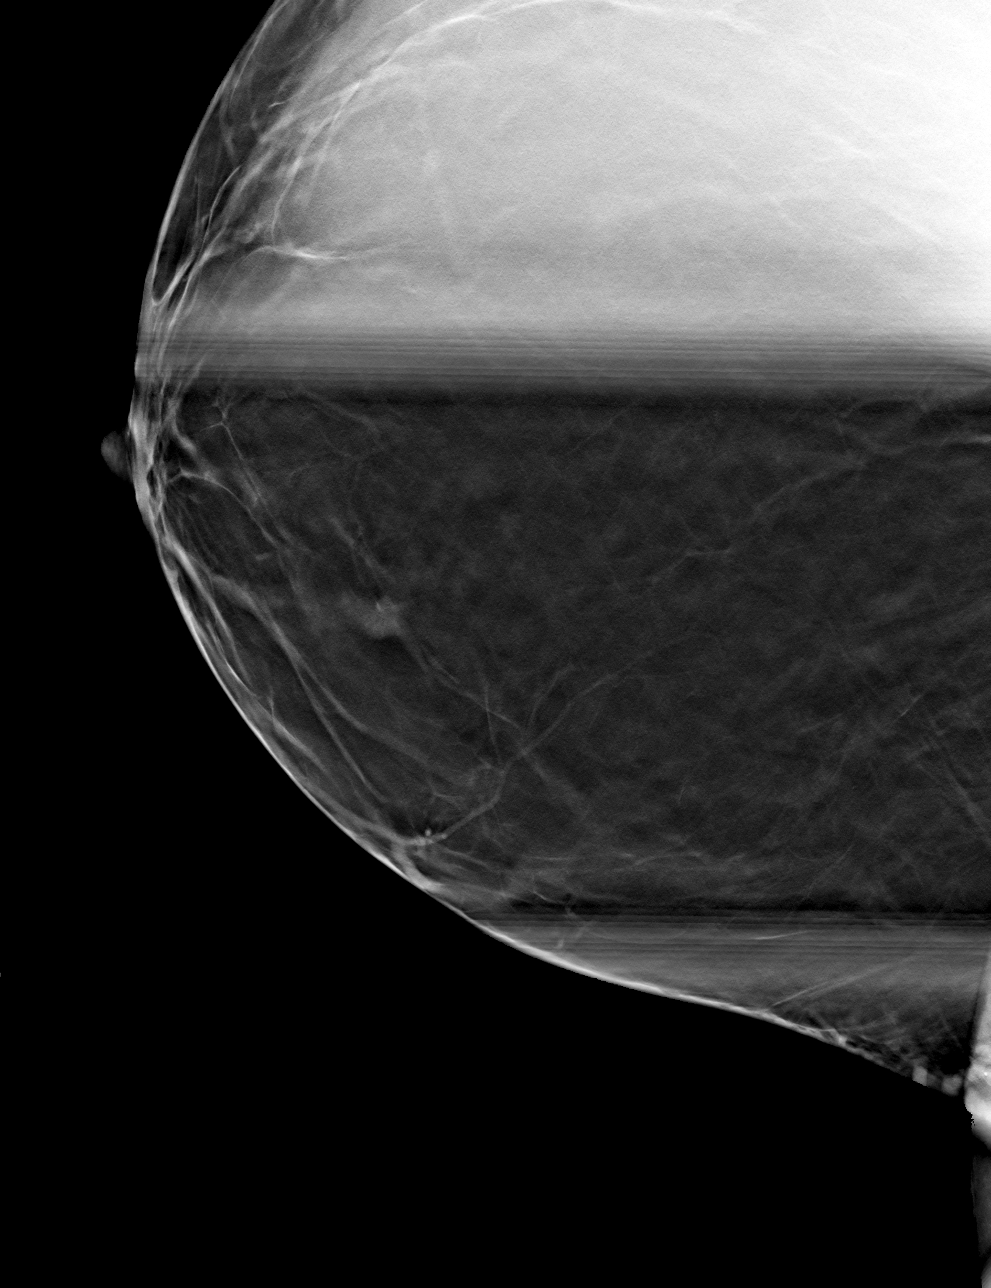

[4 of 12 positions shown; findings below may reference images not displayed]

ACR Breast Density Category b: There are scattered areas of
fibroglandular density.
FINDINGS: Spot compression CC and MLO tomograms were performed of the inner
right breast. There is a persistent asymmetry in the inner right
breast, however this is less conspicuous on the additional imaging
with the fibroglandular pattern similar in appearance when compared
to the prior mammograms. This asymmetry may be related to
overlapping vasculature and fibroglandular tissue in this location.

Mammographic images were processed with CAD.

Targeted ultrasound of the entire inner right breast from the 1 to 5
o'clock position was performed. No suspicious masses or abnormality
seen, only a tiny 2-3 mm cyst identified in the right breast at the
1 o'clock position.
IMPRESSION: Probably benign right breast asymmetry.

RECOMMENDATION:
Diagnostic mammography of the right breast with possible ultrasound
in 6 months.

I have discussed the findings and recommendations with the patient.
Results were also provided in writing at the conclusion of the
visit. If applicable, a reminder letter will be sent to the patient
regarding the next appointment.

BI-RADS CATEGORY  3: Probably benign.

## 2018-11-01 ENCOUNTER — Encounter: Payer: Self-pay | Admitting: Family Medicine

## 2018-11-01 NOTE — Assessment & Plan Note (Signed)
  Patient re-educated about  the importance of commitment to a  minimum of 150 minutes of exercise per week as able.  The importance of healthy food choices with portion control discussed, as well as eating regularly and within a 12 hour window most days. The need to choose "clean , green" food 50 to 75% of the time is discussed, as well as to make water the primary drink and set a goal of 64 ounces water daily.    Weight /BMI 10/20/2018 09/09/2018 04/22/2018  WEIGHT 324 lb 324 lb 344 lb  HEIGHT 5\' 1"  5\' 1"  5\' 1"   BMI 61.22 kg/m2 61.22 kg/m2 65 kg/m2  Some encounter information is confidential and restricted. Go to Review Flowsheets activity to see all data.

## 2018-11-12 ENCOUNTER — Encounter: Payer: Self-pay | Admitting: *Deleted

## 2018-11-16 NOTE — Progress Notes (Signed)
Office Visit Note  Patient: [name]             Date of Birth: 20-Mar-1960           MRN: [id number]             PCP: Kerri Perches, MD Referring: Kerri Perches, MD Visit Date: 11/30/2018 Occupation: Disability, Client service manager  Subjective:  Pain in both hands.   History of Present Illness: [name] is a 58 y.o. female seen in consultation per request of Dr. Lodema Hong.  According to patient she has had history of arthritis for many years.  She had cervical spine fusion several years ago.  Recently she has been having some discomfort in the neck and trapezius area.  She also gives history of occasional lower back pain which is not hurting currently.  She had left total knee replacement in 2011 and right total knee replacement in 2015.  She states she always had decreased grip strength in her hands.  Over the last 3 months she has noticed swelling in her both hands.  She has been also having discomfort in her both elbows.  She has off-and-on discomfort and pain in her right foot.  She was seen by Dr. Lodema Hong and was evaluated.  She was prescribed prednisone taper which she finished about 2 weeks ago.  She was referred to me for further evaluation.  There is positive family history of rheumatoid arthritis in her mother and her sister.  Activities of Daily Living:  Patient reports morning stiffness for 1 hour.   Patient Denies nocturnal pain.  Difficulty dressing/grooming: Denies Difficulty climbing stairs: Reports Difficulty getting out of chair: Denies Difficulty using hands for taps, buttons, cutlery, and/or writing: Reports  Review of Systems  Constitutional: Positive for fatigue. Negative for night sweats, weight gain and weight loss.  HENT: Negative for mouth sores, trouble swallowing, trouble swallowing, mouth dryness and nose dryness.   Eyes: Negative for pain, redness, itching, visual disturbance and dryness.  Respiratory: Negative for  cough, shortness of breath and difficulty breathing.   Cardiovascular: Negative for chest pain, palpitations, hypertension, irregular heartbeat and swelling in legs/feet.  Gastrointestinal: Negative for abdominal pain, blood in stool, constipation and diarrhea.  Endocrine: Negative for increased urination.  Genitourinary: Negative for difficulty urinating, painful urination and vaginal dryness.  Musculoskeletal: Positive for arthralgias, joint pain, joint swelling, muscle weakness, morning stiffness and muscle tenderness. Negative for myalgias and myalgias.  Skin: Negative for color change, rash, hair loss, skin tightness, ulcers and sensitivity to sunlight.  Allergic/Immunologic: Negative for susceptible to infections.  Neurological: Positive for headaches. Negative for dizziness, memory loss, night sweats and weakness.  Hematological: Negative for bruising/bleeding tendency and swollen glands.  Psychiatric/Behavioral: Negative for depressed mood, confusion and sleep disturbance. The patient is not nervous/anxious.     PMFS History:  Patient Active Problem List   Diagnosis Date Noted  . Generalized joint pain 10/20/2018  . Nausea 10/20/2018  . Recurrent major depressive disorder, in full remission (HCC) 04/10/2018  . Allergic dermatitis 02/02/2018  . Major depressive disorder, recurrent episode, moderate (HCC) 08/29/2017  . Back pain with sciatica 11/13/2015  . Prediabetes 07/31/2015  . Knee osteoarthritis 07/13/2013  . Chronic diastolic heart failure (HCC) 01/06/2013  . Snoring disorder 01/03/2013  . Thoracic spine pain 12/29/2012  . Vitamin D deficiency 12/08/2012  . GERD (gastroesophageal reflux disease) 07/15/2012  . Exercise intolerance 11/26/2011  . Essential hypertension 11/26/2011  . Migraine headache   .  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 Anemia 07/03/2011  . Morbid obesity ([place]) 12/03/2007    Past Medical History:  Diagnosis Date  . Chronic back pain   .  Degenerative joint disease    Left TKA in 07/2009-Dr. Landau  . Depression 2001   doesn't take any meds  . Headache(784.0)    occasionally  . Hypothyroidism    was on Levothyroxine-has been off x 4 months  . Joint pain   . Joint swelling   . Morbid obesity ([place])   . Nocturia   . Osteoarthritis of right knee 07/13/2013  . Peripheral edema    to right leg;takes Furosemide occasionally and hasnt had one in a month  . Syncope    with palpitations; evaluated by [facility] in 2008 with normal echo    Family History  Problem Relation Age of Onset  . Diabetes Mother   . Depression Mother   . Diabetes Father        emphsema   . Hyperlipidemia Father   . Emphysema Father   . Rheum arthritis Sister   . Healthy Daughter   . Healthy Daughter   . Healthy Daughter   . Colon cancer Neg Hx    Past Surgical History:  Procedure Laterality Date  . ABDOMINAL HYSTERECTOMY  1995    no oophorectomy  . CARPAL TUNNEL RELEASE  1991   Bilateral  . CESAREAN SECTION     X2  . CHOLECYSTECTOMY    . CHOLECYSTECTOMY, LAPAROSCOPIC  2007   Dr. Arnoldo Morale  . COLONOSCOPY N/A 03/03/2013   Procedure: COLONOSCOPY;  Surgeon: Rogene Houston, MD;  Location: [facility];  Service: Endoscopy;  Laterality: N/A;  930  . CRANIECTOMY SUBOCCIPITAL W/ CERVICAL LAMINECTOMY / CHIARI  2007   Repair of Arnold-Chiari malformation  . EYE SURGERY     bilateral cataract surgery  . JOINT REPLACEMENT Left 2011  . KNEE ARTHROSCOPY  1995   Dr. Durward Fortes   . THYROIDECTOMY, PARTIAL  2004   Left lobectomy - large benign nodule  . TONSILLECTOMY    . TOTAL KNEE ARTHROPLASTY  07/2009   Left knee , Dr. Mardelle Matte   . TOTAL KNEE ARTHROPLASTY Right 07/13/2013   Procedure: TOTAL KNEE ARTHROPLASTY;  Surgeon: Johnny Bridge, MD;  Location: [facility];  Service: Orthopedics;  Laterality: Right;   Social History   Social History Narrative   Is in a new relationship    Immunization History  Administered Date(s)  Administered  . Influenza Split 01/16/2012  . Influenza Whole 02/23/2007, 12/07/2008  . Influenza,inj,Quad PF,6+ Mos 12/07/2012, 02/15/2014, 11/28/2014, 01/09/2016, 04/07/2017, 12/18/2017  . Pneumococcal Polysaccharide-23 07/31/2015  . Td 10/23/2005     Objective: Vital Signs: BP 126/76 (BP Location: Right Wrist, Patient Position: Sitting, Cuff Size: Normal)   Pulse (!) 54   Resp 16   Ht 5\' 2"  (1.575 m)   Wt (!) 315 lb (142.9 kg)   BMI 57.61 kg/m    Physical Exam Vitals signs and nursing note reviewed.  Constitutional:      Appearance: She is well-developed.  HENT:     Head: Normocephalic and atraumatic.  Eyes:     Conjunctiva/sclera: Conjunctivae normal.  Neck:     Musculoskeletal: Normal range of motion.  Cardiovascular:     Rate and Rhythm: Normal rate and regular rhythm.     Heart sounds: Normal heart sounds.  Pulmonary:     Effort: Pulmonary effort is normal.     Breath sounds: Normal breath  sounds.  Abdominal:     General: Bowel sounds are normal.     Palpations: Abdomen is soft.  Lymphadenopathy:     Cervical: No cervical adenopathy.  Skin:    General: Skin is warm and dry.     Capillary Refill: Capillary refill takes less than 2 seconds.  Neurological:     Mental Status: She is alert and oriented to person, place, and time.  Psychiatric:        Behavior: Behavior normal.      Musculoskeletal Exam: C-spine was in limited range of motion.  She has good range of motion of both shoulders.  She had tenderness on palpation of her bilateral elbow joints.  She has synovitis over MCP joints as described below.  She also had tenderness in multiple joints as described below.  States she is swelling over her right ankle joint.  She has bilateral total knee replacement.  CDAI Exam: CDAI Score: 18.6  Patient Global: 8 mm; Provider Global: 8 mm Swollen: 6 ; Tender: 16  Joint Exam      Right  Left  Elbow   Tender   Tender  Wrist   Tender   Tender  MCP 1     Swollen  Tender  MCP 2  Swollen Tender  Swollen Tender  MCP 3  Swollen Tender  Swollen Tender  PIP 3   Tender   Tender  PIP 4      Tender  Ankle  Swollen Tender     Tarsometatarsal   Tender     MTP 2   Tender     MTP 3   Tender        Investigation: Findings:  10/22/18: Sed rate 36 and RF<14  Component     Latest Ref Rng & Units 10/22/2018  RA Latex Turbid.     <14 IU/mL <14  Sed Rate     0 - 30 mm/h 36 (H)   Imaging: Xr Ankle 2 Views Right  Result Date: 11/30/2018 No tibiotalar joint space narrowing was noted.  No subtalar joint narrowing was noted.  Soft tissue swelling was noted. Impression: No significant arthritis of the ankle joint was noted.  Xr Foot 2 Views Left  Result Date: 11/30/2018 First MTP, PIP and DIP narrowing was noted.  Some calcification around third and fourth metatarsal shafts was noted.  Dorsal spurring and inferior calcaneal spur was noted.  No juxta-articular osteopenia or erosive changes were noted. Impression: These findings are consistent with osteoarthritis of the foot.  Xr Foot 2 Views Right  Result Date: 11/30/2018 First MTP, PIP and DIP narrowing was noted.  Dorsal spurring was noted.  Inferior calcaneal spur was noted.  No erosive changes were noted. Impression: These findings are consistent with osteoarthritis of the foot.  Xr Hand 2 View Left  Result Date: 11/30/2018 Severe CMC narrowing and subluxation was noted.  Second and third MCP joint narrowing and juxta-articular osteopenia was noted.  Mild PIP narrowing was noted.  No intercarpal radiocarpal joint space narrowing was noted.  No erosive changes were noted. Impression: These findings are consistent with osteoarthritis and inflammatory arthritis most likely rheumatoid arthritis.  Xr Hand 2 View Right  Result Date: 11/30/2018 Severe narrowing and subluxation of right CMC was noted.  Second and third MCP joint narrowing was noted.  PIP and DIP narrowing was noted.  Calcification of second digit  distal phalanx was noted.  No intercarpal radiocarpal joint space narrowing was noted.  No erosive changes were  noted. Impression: These findings are consistent with osteoarthritis and inflammatory arthritis most likely rheumatoid arthritis.   Recent Labs: Lab Results  Component Value Date   WBC 7.7 09/09/2018   HGB 11.6 (L) 09/09/2018   PLT 424 (H) 09/09/2018   NA 139 10/22/2018   K 4.1 10/22/2018   CL 103 10/22/2018   CO2 30 10/22/2018   GLUCOSE 88 10/22/2018   BUN 16 10/22/2018   CREATININE 0.65 10/22/2018   BILITOT 0.5 04/14/2018   ALKPHOS 81 08/14/2017   AST 13 04/14/2018   ALT 10 04/14/2018   PROT 7.9 04/14/2018   ALBUMIN 4.0 08/14/2017   CALCIUM 8.9 10/22/2018   GFRAA 116 10/22/2018    Speciality Comments: No specialty comments available.  Procedures:  No procedures performed Allergies: Aleve [naproxen sodium], B12 folate [cobalamin combinations], Penicillins, Adhesive [tape], Phentermine, and Latex   Assessment / Plan:     Visit Diagnoses: Pain in both hands -patient complains of pain and swelling in her bilateral hands.  She was given a prednisone taper by Dr. Lodema Hong about a month ago which she finished 2 weeks ago.  She still has synovitis in her hands as described above.  She has positive family history of rheumatoid arthritis in her mother and her sister.  The x-rays obtained of bilateral hands today showed MCP joint narrowing and juxta-articular osteopenia consistent with rheumatoid arthritis.  Her rheumatoid factor was negative.  I will obtain some additional labs today.  Once we have labs available the plan is to start her on immunosuppressive agents.  Plan: XR Hand 2 View Right, XR Hand 2 View Left, Cyclic citrul peptide antibody, IgG, 14-3-3 eta Protein, Uric acid.  We had detailed discussion with patient regarding different treatment options.  Possible use of methotrexate and their side effects were discussed.  We will have to wait until lab results are available.   Pain of both elbows-she has tenderness on palpation of bilateral elbows.  Status post total bilateral knee replacement-she had left total knee replacement in 2011 and right in 2015.  No warmth swelling or effusion was noted.  Pain in right ankle and joints of right foot -she had warmth and swelling over her right ankle joint.  The x-ray showed no joint space narrowing but soft tissue swelling was noted.  Plan: XR Ankle 2 Views Right  Pain in both feet -she complains of feet discomfort.  The x-rays were consistent with osteoarthritis.  No erosive changes were noted.  Plan: XR Foot 2 Views Right, XR Foot 2 Views Left   High risk medication use - Plan: Hepatitis B core antibody, IgM, Hepatitis B surface antigen, Hepatitis C antibody, HIV Antibody (routine testing w rflx), QuantiFERON-TB Gold Plus, Serum protein electrophoresis with reflex, IgG, IgA, IgM, Glucose 6 phosphate dehydrogenase  Family history of rheumatoid arthritis - Mother and sister  DDD (degenerative disc disease), cervical - s/p fusion.  She has chronic pain.  Essential hypertension-her blood pressure is well controlled.  Chronic diastolic heart failure (HCC)  History of gastroesophageal reflux (GERD)  Hx of migraines  S/P partial thyroidectomy  Vitamin D deficiency  Prediabetes  History of depression   Orders: Orders Placed This Encounter  Procedures  . XR Hand 2 View Right  . XR Hand 2 View Left  . XR Ankle 2 Views Right  . XR Foot 2 Views Right  . XR Foot 2 Views Left  . Cyclic citrul peptide antibody, IgG  . 14-3-3 eta Protein  . Uric acid  .  Hepatitis B core antibody, IgM  . Hepatitis B surface antigen  . Hepatitis C antibody  . HIV Antibody (routine testing w rflx)  . QuantiFERON-TB Gold Plus  . Serum protein electrophoresis with reflex  . IgG, IgA, IgM  . Glucose 6 phosphate dehydrogenase   No orders of the defined types were placed in this encounter.   Face-to-face time spent with patient  was 60 minutes. Greater than 50% of time was spent in counseling and coordination of care.  Follow-Up Instructions: Return in about 2 weeks (around 12/14/2018).   Pollyann SavoyShaili Ceylon Arenson, MD  Note - This record has been created using Animal [employer]istDragon software.  Chart creation errors have been sought, but may not always  have been located. Such creation errors do not reflect on  the standard of medical care.

## 2018-11-20 ENCOUNTER — Other Ambulatory Visit: Payer: Self-pay | Admitting: *Deleted

## 2018-11-20 DIAGNOSIS — R6889 Other general symptoms and signs: Secondary | ICD-10-CM | POA: Diagnosis not present

## 2018-11-20 DIAGNOSIS — Z20822 Contact with and (suspected) exposure to covid-19: Secondary | ICD-10-CM

## 2018-11-21 ENCOUNTER — Other Ambulatory Visit: Payer: Self-pay | Admitting: Family Medicine

## 2018-11-22 LAB — NOVEL CORONAVIRUS, NAA: SARS-CoV-2, NAA: NOT DETECTED

## 2018-11-26 ENCOUNTER — Ambulatory Visit: Payer: Medicare HMO | Admitting: Rheumatology

## 2018-11-30 ENCOUNTER — Ambulatory Visit: Payer: Self-pay

## 2018-11-30 ENCOUNTER — Other Ambulatory Visit: Payer: Self-pay

## 2018-11-30 ENCOUNTER — Ambulatory Visit (INDEPENDENT_AMBULATORY_CARE_PROVIDER_SITE_OTHER): Payer: Medicare HMO

## 2018-11-30 ENCOUNTER — Ambulatory Visit (INDEPENDENT_AMBULATORY_CARE_PROVIDER_SITE_OTHER): Payer: Medicare HMO | Admitting: Rheumatology

## 2018-11-30 ENCOUNTER — Encounter: Payer: Self-pay | Admitting: Rheumatology

## 2018-11-30 VITALS — BP 126/76 | HR 54 | Resp 16 | Ht 62.0 in | Wt 315.0 lb

## 2018-11-30 DIAGNOSIS — M79671 Pain in right foot: Secondary | ICD-10-CM

## 2018-11-30 DIAGNOSIS — I5032 Chronic diastolic (congestive) heart failure: Secondary | ICD-10-CM

## 2018-11-30 DIAGNOSIS — M79672 Pain in left foot: Secondary | ICD-10-CM

## 2018-11-30 DIAGNOSIS — E890 Postprocedural hypothyroidism: Secondary | ICD-10-CM

## 2018-11-30 DIAGNOSIS — Z96653 Presence of artificial knee joint, bilateral: Secondary | ICD-10-CM

## 2018-11-30 DIAGNOSIS — M79641 Pain in right hand: Secondary | ICD-10-CM | POA: Diagnosis not present

## 2018-11-30 DIAGNOSIS — Z8719 Personal history of other diseases of the digestive system: Secondary | ICD-10-CM | POA: Diagnosis not present

## 2018-11-30 DIAGNOSIS — Z8659 Personal history of other mental and behavioral disorders: Secondary | ICD-10-CM

## 2018-11-30 DIAGNOSIS — Z8669 Personal history of other diseases of the nervous system and sense organs: Secondary | ICD-10-CM

## 2018-11-30 DIAGNOSIS — M79642 Pain in left hand: Secondary | ICD-10-CM

## 2018-11-30 DIAGNOSIS — M25522 Pain in left elbow: Secondary | ICD-10-CM

## 2018-11-30 DIAGNOSIS — Z9889 Other specified postprocedural states: Secondary | ICD-10-CM

## 2018-11-30 DIAGNOSIS — M25521 Pain in right elbow: Secondary | ICD-10-CM

## 2018-11-30 DIAGNOSIS — M5030 Other cervical disc degeneration, unspecified cervical region: Secondary | ICD-10-CM

## 2018-11-30 DIAGNOSIS — I10 Essential (primary) hypertension: Secondary | ICD-10-CM

## 2018-11-30 DIAGNOSIS — M25571 Pain in right ankle and joints of right foot: Secondary | ICD-10-CM | POA: Diagnosis not present

## 2018-11-30 DIAGNOSIS — Z79899 Other long term (current) drug therapy: Secondary | ICD-10-CM

## 2018-11-30 DIAGNOSIS — R7303 Prediabetes: Secondary | ICD-10-CM

## 2018-11-30 DIAGNOSIS — Z8261 Family history of arthritis: Secondary | ICD-10-CM

## 2018-11-30 DIAGNOSIS — E559 Vitamin D deficiency, unspecified: Secondary | ICD-10-CM

## 2018-11-30 NOTE — Progress Notes (Signed)
Pharmacy Note  Subjective: Patient presents today to the [facility] to see Dr. Estanislado Pandy.  Patient seen by the pharmacist for counseling on methotrexate for rheumatoid arthritis.  She is naive to therapy.  Objective: CBC    Component Value Date/Time   WBC 7.7 09/09/2018 0851   RBC 5.01 09/09/2018 0851   HGB 11.6 (L) 09/09/2018 0851   HCT 37.3 09/09/2018 0851   PLT 424 (H) 09/09/2018 0851   MCV 74.5 (L) 09/09/2018 0851   MCH 23.2 (L) 09/09/2018 0851   MCHC 31.1 (L) 09/09/2018 0851   RDW 14.2 09/09/2018 0851   LYMPHSABS 3.0 08/14/2017 1139   MONOABS 0.4 08/14/2017 1139   EOSABS 0.1 08/14/2017 1139   BASOSABS 0.0 08/14/2017 1139    CMP     Component Value Date/Time   NA 139 10/22/2018 1049   K 4.1 10/22/2018 1049   CL 103 10/22/2018 1049   CO2 30 10/22/2018 1049   GLUCOSE 88 10/22/2018 1049   BUN 16 10/22/2018 1049   CREATININE 0.65 10/22/2018 1049   CALCIUM 8.9 10/22/2018 1049   PROT 7.9 04/14/2018 0858   ALBUMIN 4.0 08/14/2017 1139   AST 13 04/14/2018 0858   ALT 10 04/14/2018 0858   ALKPHOS 81 08/14/2017 1139   BILITOT 0.5 04/14/2018 0858   GFRNONAA 100 10/22/2018 1049   GFRAA 116 10/22/2018 1049    Baseline Immunosuppressant Therapy Labs TB GOLD: pending 11/30/2018   Hepatitis Panel: pending 11/30/2018 Hepatitis Latest Ref Rng & Units 11/25/2014  Hep C Ab 0.0 - 0.9 s/co ratio <0.1   HIV Lab Results  Component Value Date   HIV Non Reactive 11/25/2014   Immunoglobulins: pending 914/2020  SPEP: pending 11/30/2018  G6PD No results found for: G6PDH TPMT No results found for: TPMT   Chest-xray:  No active cardiopulmonary disease.  Contraception: hysterectomy  Alcohol use: infrequent  Assessment/Plan:   Patient was counseled on the purpose, proper use, and adverse effects of methotrexate including nausea, infection, and signs and symptoms of pneumonitis. Discussed that there is the possibility of an increased risk of malignancy, specifically  lymphomas, but it is not well understood if this increased risk is due to the medication or the disease state.  Instructed patient that medication should be held for infection and prior to surgery.  Advised patient to avoid live vaccines. Recommend annual influenza, Pneumovax 23, Prevnar 13, and Shingrix as indicated.   Reviewed instructions with patient to take methotrexate weekly along with folic acid daily.  Discussed the importance of frequent monitoring of kidney and liver function and blood counts, and provided patient with standing lab instructions.  Counseled patient to avoid NSAIDs and alcohol while on methotrexate.  Provided patient with educational materials on methotrexate and answered all questions.   Patient voiced understanding.   Will discuss further at next office visit once labs have resulted.  She will need to sign a consent form prior to treatment.  All questions encouraged and answered.  Instructed patient to call with any other questions or concerns.  Mariella Saa, PharmD, [place], [place] [facility] Pharmacist [phone]  11/30/2018 10:18 AM

## 2018-12-03 NOTE — Progress Notes (Signed)
Virtual Visit via Video Note  I connected with [name] on 12/07/18 at  9:00 AM EDT by a video enabled telemedicine application and verified that I am speaking with the correct person using two identifiers.   I discussed the limitations of evaluation and management by telemedicine and the availability of in person appointments. The patient expressed understanding and agreed to proceed.     I discussed the assessment and treatment plan with the patient. The patient was provided an opportunity to ask questions and all were answered. The patient agreed with the plan and demonstrated an understanding of the instructions.   The patient was advised to call back or seek an in-person evaluation if the symptoms worsen or if the condition fails to improve as anticipated.  I provided 15 minutes of non-face-to-face time during this encounter.   Neysa Hotter, MD    [facility] MD/PA/NP OP Progress Note  12/07/2018 9:20 AM [name]  MRN:  [id number]  Chief Complaint:  Chief Complaint    Depression; Follow-up     HPI:  This is a follow-up appointment for depression.  She states that she ran out of her medication 4 days ago.  She feels a little depressed.  She had been feeling fine otherwise.  She states that all of her children finally moved out of the house (and smiled).  Her grandchildren age 58, 69, 5) stayed at her place a few times per week.  She takes a walk with them a few times per week.  She reports good relationship with her boyfriend.  She has insomnia; she finds hydroxyzine to be helpful.  She has good energy and motivation.  She has fair concentration.  She has good appetite.  She denies SI.  She denies anxiety or panic attacks.   Visit Diagnosis:    ICD-10-CM   1. Recurrent major depressive disorder, in partial remission (HCC)  F33.41     Past Psychiatric History: Please see initial evaluation for full details. I have reviewed the history. No updates at this time.      Past Medical History:  Past Medical History:  Diagnosis Date  . Chronic back pain   . Degenerative joint disease    Left TKA in 07/2009-Dr. Landau  . Depression 2001   doesn't take any meds  . Headache(784.0)    occasionally  . Hypothyroidism    was on Levothyroxine-has been off x 4 months  . Joint pain   . Joint swelling   . Morbid obesity (HCC)   . Nocturia   . Osteoarthritis of right knee 07/13/2013  . Peripheral edema    to right leg;takes Furosemide occasionally and hasnt had one in a month  . Syncope    with palpitations; evaluated by [facility] in 2008 with normal echo    Past Surgical History:  Procedure Laterality Date  . ABDOMINAL HYSTERECTOMY  1995    no oophorectomy  . CARPAL TUNNEL RELEASE  1991   Bilateral  . CESAREAN SECTION     X2  . CHOLECYSTECTOMY    . CHOLECYSTECTOMY, LAPAROSCOPIC  2007   Dr. Lovell Sheehan  . COLONOSCOPY N/A 03/03/2013   Procedure: COLONOSCOPY;  Surgeon: Malissa Hippo, MD;  Location: [facility];  Service: Endoscopy;  Laterality: N/A;  930  . CRANIECTOMY SUBOCCIPITAL W/ CERVICAL LAMINECTOMY / CHIARI  2007   Repair of Arnold-Chiari malformation  . EYE SURGERY     bilateral cataract surgery  . JOINT REPLACEMENT Left 2011  .  KNEE ARTHROSCOPY  1995   Dr. Cleophas Dunker   . THYROIDECTOMY, PARTIAL  2004   Left lobectomy - large benign nodule  . TONSILLECTOMY    . TOTAL KNEE ARTHROPLASTY  07/2009   Left knee , Dr. Dion Saucier   . TOTAL KNEE ARTHROPLASTY Right 07/13/2013   Procedure: TOTAL KNEE ARTHROPLASTY;  Surgeon: Eulas Post, MD;  Location: [facility] OR;  Service: Orthopedics;  Laterality: Right;    Family Psychiatric History: Please see initial evaluation for full details. I have reviewed the history. No updates at this time.     Family History:  Family History  Problem Relation Age of Onset  . Diabetes Mother   . Depression Mother   . Diabetes Father        emphsema   . Hyperlipidemia Father   . Emphysema  Father   . Rheum arthritis Sister   . Healthy Daughter   . Healthy Daughter   . Healthy Daughter   . Colon cancer Neg Hx     Social History:  Social History   Socioeconomic History  . Marital status: Significant Other    Spouse name: Not on file  . Number of children: 3  . Years of education: 99  . Highest education level: 12th grade  Occupational History  . Occupation: Employed seeking disabilty 2012  Social Needs  . Financial resource strain: Not hard at all  . Food insecurity    Worry: Never true    Inability: Never true  . Transportation needs    Medical: No    Non-medical: No  Tobacco Use  . Smoking status: Never Smoker  . Smokeless tobacco: Never Used  Substance and Sexual Activity  . Alcohol use: Yes    Alcohol/week: 0.0 standard drinks    Comment: rarely  . Drug use: No  . Sexual activity: Yes    Birth control/protection: Surgical  Lifestyle  . Physical activity    Days per week: 2 days    Minutes per session: 30 min  . Stress: Only a little  Relationships  . Social connections    Talks on phone: More than three times a week    Gets together: Once a week    Attends religious service: 1 to 4 times per year    Active member of club or organization: Yes    Attends meetings of clubs or organizations: 1 to 4 times per year    Relationship status: Living with partner  Other Topics Concern  . Not on file  Social History Narrative   Is in a new relationship     Allergies:  Allergies  Allergen Reactions  . Aleve [Naproxen Sodium] Shortness Of Breath  . B12 Folate [Cobalamin Combinations] Dermatitis  . Penicillins Swelling    .Has patient had a PCN reaction causing immediate rash, facial/tongue/throat swelling, SOB or lightheadedness with hypotension: Yes Has patient had a PCN reaction causing severe rash involving mucus membranes or skin necrosis: No Has patient had a PCN reaction that required hospitalization: No Has patient had a PCN reaction  occurring within the last 10 years: No If all of the above answers are "NO", then may proceed with Cephalosporin use.   . Adhesive [Tape]     rash  . Phentermine   . Latex Rash    Metabolic Disorder Labs: Lab Results  Component Value Date   HGBA1C 6.2 (H) 09/09/2018   MPG 131 09/09/2018   MPG 128 04/14/2018   No results found for: PROLACTIN Lab Results  Component Value Date   CHOL 147 04/14/2018   TRIG 94 04/14/2018   HDL 44 (L) 04/14/2018   CHOLHDL 3.3 04/14/2018   VLDL 12 10/02/2016   LDLCALC 84 04/14/2018   LDLCALC 60 04/11/2017   Lab Results  Component Value Date   TSH 3.67 04/14/2018   TSH 2.34 04/11/2017    Therapeutic Level Labs: No results found for: LITHIUM No results found for: VALPROATE No components found for:  CBMZ  Current Medications: Current Outpatient Medications  Medication Sig Dispense Refill  . acyclovir (ZOVIRAX) 400 MG tablet TAKE 1 TABLET BY MOUTH THREE TIMES A DAY (Patient taking differently: as needed. ) 30 tablet 0  . diltiazem (CARDIZEM) 30 MG tablet Take 1 tablet (30 mg total) by mouth 2 (two) times daily. (Patient not taking: Reported on 11/30/2018) 60 tablet 3  . hydrochlorothiazide (HYDRODIURIL) 25 MG tablet TAKE 1 TABLET BY MOUTH EVERY DAY 90 tablet 3  . hydrOXYzine (ATARAX/VISTARIL) 50 MG tablet TAKE ONE TO TWO TABLETS AT BEDTIME FOR SLEEP, BY MOUTH (Patient taking differently: as needed. Take one to two tablets at bedtime  For sleep, by mouth) 180 tablet 2  . ibuprofen (ADVIL) 800 MG tablet TAKE ONE TABLET BY MOUTH TWICE WEEKLY AS NEEDED, FOR KNEE OR BACK PAIN 30 tablet 0  . metFORMIN (GLUCOPHAGE) 500 MG tablet Take 1 tablet (500 mg total) by mouth daily. 90 tablet 3  . ondansetron (ZOFRAN) 4 MG tablet Take 1 tablet (4 mg total) by mouth every 8 (eight) hours as needed for nausea or vomiting. 20 tablet 0  . pantoprazole (PROTONIX) 40 MG tablet Take 1 tablet (40 mg total) by mouth daily. (Patient not taking: Reported on 11/30/2018) 14  tablet 0  . phentermine (ADIPEX-P) 37.5 MG tablet Take 1 tablet (37.5 mg total) by mouth daily before breakfast. 30 tablet 3  . predniSONE (DELTASONE) 20 MG tablet Take one tablet by mouth three times daily for 3 days, then one tablet two times daily for3 days, then one tablet once daily for 3 days, then stop (Patient not taking: Reported on 11/30/2018) 18 tablet 0  . sertraline (ZOLOFT) 100 MG tablet Take 1.5 tablets (150 mg total) by mouth daily. 135 tablet 1   No current facility-administered medications for this visit.      Musculoskeletal: Strength & Muscle Tone: N/A Gait & Station: N/A Patient leans: N/A  Psychiatric Specialty Exam: Review of Systems  Psychiatric/Behavioral: Negative for depression, hallucinations, memory loss, substance abuse and suicidal ideas. The patient has insomnia. The patient is not nervous/anxious.   All other systems reviewed and are negative.   There were no vitals taken for this visit.There is no height or weight on file to calculate BMI.  General Appearance: Fairly Groomed  Eye Contact:  Good  Speech:  Clear and Coherent  Volume:  Normal  Mood:  "good"  Affect:  Appropriate, Congruent and Full Range  Thought Process:  Coherent  Orientation:  Full (Time, Place, and Person)  Thought Content: Logical   Suicidal Thoughts:  No  Homicidal Thoughts:  No  Memory:  Immediate;   Good  Judgement:  Good  Insight:  Good  Psychomotor Activity:  Normal  Concentration:  Concentration: Good and Attention Span: Good  Recall:  Good  Fund of Knowledge: Good  Language: Good  Akathisia:  No  Handed:  Right  AIMS (if indicated): not done  Assets:  Communication Skills Desire for Improvement  ADL's:  Intact  Cognition: WNL  Sleep:  Poor  Screenings: AIMS     [facility]  AIMS Total Score  0    AUDIT     [facility]  Alcohol Use Disorder Identification Test Final Score (AUDIT)  0    GAD-7     Virtual BH Phone [facility]  Total GAD-7 Score  7  14    PHQ2-9     [facility]2018 [facility] Visit [facility]/09/2018 in [facility]2018 [facility] Clinical Support from [facility]2018 [facility]18/2017 [facility]  PHQ-2 Total Score  0  0  0  1  1  PHQ-9 Total Score  -  -  2  -  -       Assessment and Plan:  [name] is a 58 y.o. year old female with a history of depression,  hypertension, obesity , who presents for follow up appointment for Recurrent major depressive disorder, in partial remission (HCC)  # MDD, recurrent in partial remission She denies any significant mood symptoms since her last visit except that she felt a little depressed when she ran out of her medication.  Provided psychoeducation about maintenance therapy given she had at least a few times of depressive episode in the past.  We will plan to continue sertraline at the same dose to avoid relapse in her symptoms.  She is in agreement with plans.  Discussed behavioral activation.   Plan I have reviewed and updated plans as below 1. Continue sertraline 150 mg daily  2.Continue hydroxyzine 50 mg daily prn for anxiety 3. Next appointment: 3/15 at 9 AM for 20 mins, video  The patient demonstrates the following risk factors for suicide: Chronic risk factors for suicide include:psychiatric disorder ofdepressionand completed suicide in a family member. Acute risk factorsfor suicide include: loss (financial, interpersonal, professional) and recent discharge from inpatient psychiatry. Protective factorsfor this patient include: positive social support, responsibility to others (children,  family), coping skills and hope for the future. Considering these factors, the overall suicide risk at this point appears to below. Patientisappropriate for outpatient follow up.   Neysa Hottereina Latressa Harries, MD 12/07/2018, 9:20 AM

## 2018-12-03 NOTE — Progress Notes (Signed)
Will discuss at the follow-up visit

## 2018-12-04 NOTE — Progress Notes (Signed)
Office Visit Note  Patient: [name]             Date of Birth: 09-24-1960           MRN: [id number]             PCP: Kerri Perches, MD Referring: Kerri Perches, MD Visit Date: 12/15/2018 Occupation: @GUAROCC @  Subjective:  Pain and swelling in multiple joints.   History of Present Illness: [name] is a 58 y.o. female with seronegative rheumatoid arthritis.  She continues to have pain and swelling in multiple joints.  She states she is having difficulty doing routine activities and difficulty getting dressed.  She continues to have pain and swelling in her hands and feet.  She has pain and swelling in her bilateral knee joints.  Activities of Daily Living:  Patient reports morning stiffness for several hours.   Patient Reports nocturnal pain.  Difficulty dressing/grooming: Denies Difficulty climbing stairs: Reports Difficulty getting out of chair: Denies Difficulty using hands for taps, buttons, cutlery, and/or writing: Reports  Review of Systems  Constitutional: Positive for fatigue. Negative for night sweats, weight gain and weight loss.  HENT: Negative for mouth sores, trouble swallowing, trouble swallowing, mouth dryness and nose dryness.   Eyes: Negative for pain, redness, itching, visual disturbance and dryness.  Respiratory: Negative for cough, shortness of breath and difficulty breathing.   Cardiovascular: Negative for chest pain, palpitations, hypertension, irregular heartbeat and swelling in legs/feet.  Gastrointestinal: Negative for blood in stool, constipation and diarrhea.  Endocrine: Negative for increased urination.  Genitourinary: Negative for difficulty urinating, painful urination and vaginal dryness.  Musculoskeletal: Positive for arthralgias, joint pain, joint swelling and morning stiffness. Negative for myalgias, muscle weakness, muscle tenderness and myalgias.  Skin: Negative for color change, rash, hair loss, skin  tightness, ulcers and sensitivity to sunlight.  Allergic/Immunologic: Negative for susceptible to infections.  Neurological: Positive for headaches. Negative for dizziness, memory loss, night sweats and weakness.  Hematological: Negative for bruising/bleeding tendency and swollen glands.  Psychiatric/Behavioral: Negative for depressed mood, confusion and sleep disturbance. The patient is not nervous/anxious.     PMFS History:  Patient Active Problem List   Diagnosis Date Noted   Generalized joint pain 10/20/2018   Nausea 10/20/2018   Recurrent major depressive disorder, in full remission (HCC) 04/10/2018   Allergic dermatitis 02/02/2018   Major depressive disorder, recurrent episode, moderate (HCC) 08/29/2017   Back pain with sciatica 11/13/2015   Prediabetes 07/31/2015   Knee osteoarthritis 07/13/2013   Chronic diastolic heart failure (HCC) 01/06/2013   Snoring disorder 01/03/2013   Thoracic spine pain 12/29/2012   Vitamin D deficiency 12/08/2012   GERD (gastroesophageal reflux disease) 07/15/2012   Exercise intolerance 11/26/2011   Essential hypertension 11/26/2011   Migraine headache    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 Landau   Depression 2001   doesn't take any meds   Headache(784.0)    occasionally   Hypothyroidism    was on Levothyroxine-has been off x 4 months   Joint pain    Joint swelling    Morbid obesity (HCC)    Nocturia    Osteoarthritis of right knee 07/13/2013   Peripheral edema    to right leg;takes Furosemide occasionally and hasnt had one in a month  Syncope    with palpitations; evaluated by [facility] in 2008 with normal echo    Family History  Problem Relation Age of Onset   Diabetes Mother    Depression Mother     Diabetes Father        emphsema    Hyperlipidemia Father    Emphysema Father    Rheum arthritis Sister    Healthy Daughter    Healthy Daughter    Healthy Daughter    Colon cancer Neg Hx    Past Surgical History:  Procedure Laterality Date   ABDOMINAL HYSTERECTOMY  1995    no oophorectomy   CARPAL TUNNEL RELEASE  1991   Bilateral   CESAREAN SECTION     X2   CHOLECYSTECTOMY     CHOLECYSTECTOMY, LAPAROSCOPIC  2007   Dr. Arnoldo Morale   COLONOSCOPY N/A 03/03/2013   Procedure: COLONOSCOPY;  Surgeon: Rogene Houston, MD;  Location: [facility];  Service: Endoscopy;  Laterality: N/A;  930   CRANIECTOMY [place] / CHIARI  2007   Repair of Arnold-Chiari malformation   EYE SURGERY     bilateral cataract surgery   JOINT REPLACEMENT Left 2011   KNEE ARTHROSCOPY  1995   Dr. Durward Fortes    THYROIDECTOMY, PARTIAL  2004   Left lobectomy - large benign nodule   TONSILLECTOMY     TOTAL KNEE ARTHROPLASTY  07/2009   Left knee , Dr. Mardelle Matte    TOTAL KNEE ARTHROPLASTY Right 07/13/2013   Procedure: TOTAL KNEE ARTHROPLASTY;  Surgeon: Johnny Bridge, MD;  Location: [facility];  Service: Orthopedics;  Laterality: Right;   Social History   Social History Narrative   Is in a new relationship    Immunization History  Administered Date(s) Administered   Influenza Split 01/16/2012   Influenza Whole 02/23/2007, 12/07/2008   Influenza,inj,Quad PF,6+ Mos 12/07/2012, 02/15/2014, 11/28/2014, 01/09/2016, 04/07/2017, 12/18/2017   Pneumococcal Polysaccharide-23 07/31/2015   Td 10/23/2005     Objective: Vital Signs: BP 128/77 (BP Location: Left Wrist, Patient Position: Sitting, Cuff Size: Normal)    Pulse (!) 59    Resp 15    Ht 5\' 2"  (1.575 m)    Wt (!) 318 lb 9.6 oz (144.5 kg)    BMI 58.27 kg/m    Physical Exam Vitals signs and nursing note reviewed.  Constitutional:      Appearance: She is well-developed.  HENT:     Head: Normocephalic and atraumatic.    Eyes:     Conjunctiva/sclera: Conjunctivae normal.  Neck:     Musculoskeletal: Normal range of motion.  Cardiovascular:     Rate and Rhythm: Normal rate and regular rhythm.     Heart sounds: Normal heart sounds.  Pulmonary:     Effort: Pulmonary effort is normal.     Breath sounds: Normal breath sounds.  Abdominal:     General: Bowel sounds are normal.     Palpations: Abdomen is soft.  Lymphadenopathy:     Cervical: No cervical adenopathy.  Skin:    General: Skin is warm and dry.     Capillary Refill: Capillary refill takes less than 2 seconds.  Neurological:     Mental Status: She is alert and oriented to person, place, and time.  Psychiatric:        Behavior: Behavior normal.      Musculoskeletal Exam: C-spine was good range of motion.  Shoulder joints with good range of motion.  Elbow joints with good range of motion.  She had tenderness and synovitis over bilateral wrist joints and extensor tenosynovitis of her right wrist joint.  She has synovitis over MCPs as described below.  Hip joints were difficult to assess.  She had discomfort and swelling in bilateral knee joints.  She had tenderness over MTPs as described below.  CDAI Exam: CDAI Score: 18.8  Patient Global: 9 mm; Provider Global: 9 mm Swollen: 6 ; Tender: 16  Joint Exam      Right  Left  Elbow   Tender   Tender  Wrist   Tender   Tender  MCP 1     Swollen Tender  MCP 2  Swollen Tender  Swollen Tender  MCP 3  Swollen Tender  Swollen Tender  PIP 3   Tender   Tender  PIP 4      Tender  Ankle  Swollen Tender     Tarsometatarsal   Tender     MTP 2   Tender     MTP 3   Tender        Investigation: No additional findings.  Imaging: Xr Ankle 2 Views Right  Result Date: 11/30/2018 No tibiotalar joint space narrowing was noted.  No subtalar joint narrowing was noted.  Soft tissue swelling was noted. Impression: No significant arthritis of the ankle joint was noted.  Xr Foot 2 Views Left  Result Date:  11/30/2018 First MTP, PIP and DIP narrowing was noted.  Some calcification around third and fourth metatarsal shafts was noted.  Dorsal spurring and inferior calcaneal spur was noted.  No juxta-articular osteopenia or erosive changes were noted. Impression: These findings are consistent with osteoarthritis of the foot.  Xr Foot 2 Views Right  Result Date: 11/30/2018 First MTP, PIP and DIP narrowing was noted.  Dorsal spurring was noted.  Inferior calcaneal spur was noted.  No erosive changes were noted. Impression: These findings are consistent with osteoarthritis of the foot.  Xr Hand 2 View Left  Result Date: 11/30/2018 Severe CMC narrowing and subluxation was noted.  Second and third MCP joint narrowing and juxta-articular osteopenia was noted.  Mild PIP narrowing was noted.  No intercarpal radiocarpal joint space narrowing was noted.  No erosive changes were noted. Impression: These findings are consistent with osteoarthritis and inflammatory arthritis most likely rheumatoid arthritis.  Xr Hand 2 View Right  Result Date: 11/30/2018 Severe narrowing and subluxation of right CMC was noted.  Second and third MCP joint narrowing was noted.  PIP and DIP narrowing was noted.  Calcification of second digit distal phalanx was noted.  No intercarpal radiocarpal joint space narrowing was noted.  No erosive changes were noted. Impression: These findings are consistent with osteoarthritis and inflammatory arthritis most likely rheumatoid arthritis.   Recent Labs: Lab Results  Component Value Date   WBC 7.7 09/09/2018   HGB 11.6 (L) 09/09/2018   PLT 424 (H) 09/09/2018   NA 139 10/22/2018   K 4.1 10/22/2018   CL 103 10/22/2018   CO2 30 10/22/2018   GLUCOSE 88 10/22/2018   BUN 16 10/22/2018   CREATININE 0.65 10/22/2018   BILITOT 0.5 04/14/2018   ALKPHOS 81 08/14/2017   AST 13 04/14/2018   ALT 10 04/14/2018   PROT 7.7 11/30/2018   ALBUMIN 4.0 08/14/2017   CALCIUM 8.9 10/22/2018   GFRAA 116  10/22/2018   QFTBGOLDPLUS NEGATIVE 11/30/2018  November 30, 2018 IFE negative, IgG mildly elevated, TB Gold negative, hepatitis B-, hepatitis C negative, HIV negative, G6PD normal, uric acid 5.7, anti-CCP  negative  Speciality Comments: No specialty comments available.  Procedures:  No procedures performed Allergies: Aleve [naproxen sodium], B12 folate [cobalamin combinations], Penicillins, Adhesive [tape], Phentermine, and Latex   Assessment / Plan:     Visit Diagnoses: Rheumatoid arthritis of multiple sites with negative rheumatoid factor (HCC) - RF negative, anti-CCP negative, patient has severe synovitis and radiographic changes with MCP narrowing and juxta-articular osteopenia.  Patient is a lot of discomfort.  She states she has been taking ibuprofen.  She continues to have synovitis in multiple joints.  We discussed low-dose prednisone as bridging therapy.  The plan is to start on prednisone 10 mg p.o. daily and taper by 2.5 mg every week.  We had detailed discussion regarding methotrexate at the last visit.  High also had detailed discussion regarding methotrexate today.  Indications side effects contraindications were discussed at length.  The plan is to start her on methotrexate 0.6 mL subcu weekly and increase it to 0.8 mL subcu weekly.  She will need lab work every 2 weeks x 2 and then every 2 months.  If she has an adequate response over 2 months I may consider more aggressive therapy.  She has history of CHF and we will avoid anti-TNF's.  High risk medication use -all her labs were negative.  Plan to start her on methotrexate today..  Status post total bilateral knee replacement - left total knee replacement in 2011 and right in 2015.  She has pain and swelling in bilateral knee joints.  Primary osteoarthritis of both feet-radiographic findings are consistent with osteoarthritis although she does have tenderness from rheumatoid arthritis.  Family history of rheumatoid  arthritis  DDD (degenerative disc disease), cervical-she has limited range of motion with discomfort.  Essential hypertension-her blood pressure is under control.  Chronic diastolic heart failure (HCC)  History of gastroesophageal reflux (GERD)  Prediabetes  Vitamin D deficiency  History of depression  Hx of migraines  S/P partial thyroidectomy  Orders: Orders Placed This Encounter  Procedures   CBC with Differential/Platelet   COMPLETE METABOLIC PANEL WITH GFR   Meds ordered this encounter  Medications   methotrexate 50 MG/2ML injection    Sig: Inject 0.6 mL under the skin weekly for two weeks, then inject 0.8 mL under the skin weekly.    Dispense:  4 mL    Refill:  0    Please dispense 2 mL vials with preservative   TUBERCULIN SYR 1CC/27GX1/2" (B-D TB SYRINGE 1CC/27GX1/2") 27G X 1/2" 1 ML MISC    Sig: 12 Syringes by Does not apply route once a week.    Dispense:  12 each    Refill:  3   folic acid (FOLVITE) 1 MG tablet    Sig: Take 2 tablets (2 mg total) by mouth daily.    Dispense:  180 tablet    Refill:  3   predniSONE (DELTASONE) 5 MG tablet    Sig: Take 2 tablets (10 mg total) by mouth daily with breakfast for 7 days, THEN 1.5 tablets (7.5 mg total) daily with breakfast for 7 days, THEN 1 tablet (5 mg total) daily with breakfast for 7 days, THEN 0.5 tablets (2.5 mg total) daily with breakfast for 7 days.    Dispense:  35 tablet    Refill:  0    Face-to-face time spent with patient was 35 minutes. Greater than 50% of time was spent in counseling and coordination of care.  Follow-Up Instructions: Return in about 4 weeks (around 01/12/2019) for Rheumatoid arthritis,  Osteoarthritis, MTX NSFU.   Pollyann SavoyShaili Parminder Trapani, MD  Note - This record has been created using Animal nutritionistDragon software.  Chart creation errors have been sought, but may not always  have been located. Such creation errors do not reflect on  the standard of medical care.

## 2018-12-05 LAB — QUANTIFERON-TB GOLD PLUS
Mitogen-NIL: >10 IU/mL
NIL: 0.1 IU/mL
QuantiFERON-TB Gold Plus: NEGATIVE
TB1-NIL: 0.02 IU/mL
TB2-NIL: 0.02 IU/mL

## 2018-12-05 LAB — HEPATITIS C ANTIBODY
Hepatitis C Ab: NONREACTIVE
SIGNAL TO CUT-OFF: 0.02 (ref <1.00)

## 2018-12-05 LAB — HEPATITIS B CORE ANTIBODY, IGM: Hep B C IgM: NONREACTIVE

## 2018-12-05 LAB — GLUCOSE 6 PHOSPHATE DEHYDROGENASE: G-6PDH: 13.4 U/g Hgb (ref 7.0–20.5)

## 2018-12-05 LAB — IGG, IGA, IGM
IgG (Immunoglobin G), Serum: 1793 mg/dL — ABNORMAL HIGH (ref 600–1640)
IgM, Serum: 143 mg/dL (ref 50–300)
Immunoglobulin A: 245 mg/dL (ref 47–310)

## 2018-12-05 LAB — URIC ACID: Uric Acid, Serum: 5.7 mg/dL (ref 2.5–7.0)

## 2018-12-05 LAB — PROTEIN ELECTROPHORESIS, SERUM, WITH REFLEX
Albumin ELP: 3.7 g/dL — ABNORMAL LOW (ref 3.8–4.8)
Alpha 1: 0.5 g/dL — ABNORMAL HIGH (ref 0.2–0.3)
Alpha 2: 0.9 g/dL (ref 0.5–0.9)
Beta 2: 0.5 g/dL (ref 0.2–0.5)
Beta Globulin: 0.5 g/dL (ref 0.4–0.6)
Gamma Globulin: 1.6 g/dL (ref 0.8–1.7)
Total Protein: 7.7 g/dL (ref 6.1–8.1)

## 2018-12-05 LAB — 14-3-3 ETA PROTEIN: 14-3-3 eta Protein: <0.2 ng/mL (ref <0.2)

## 2018-12-05 LAB — HIV ANTIBODY (ROUTINE TESTING W REFLEX): HIV 1&2 Ab, 4th Generation: NONREACTIVE

## 2018-12-05 LAB — HEPATITIS B SURFACE ANTIGEN: Hepatitis B Surface Ag: NONREACTIVE

## 2018-12-05 LAB — CYCLIC CITRUL PEPTIDE ANTIBODY, IGG: Cyclic Citrullin Peptide Ab: <16 UNITS

## 2018-12-05 LAB — IFE INTERPRETATION: Immunofix Electr Int: NOT DETECTED

## 2018-12-07 ENCOUNTER — Ambulatory Visit (INDEPENDENT_AMBULATORY_CARE_PROVIDER_SITE_OTHER): Payer: Medicare HMO | Admitting: Psychiatry

## 2018-12-07 ENCOUNTER — Other Ambulatory Visit: Payer: Self-pay

## 2018-12-07 ENCOUNTER — Encounter (HOSPITAL_COMMUNITY): Payer: Self-pay | Admitting: Psychiatry

## 2018-12-07 DIAGNOSIS — R69 Illness, unspecified: Secondary | ICD-10-CM | POA: Diagnosis not present

## 2018-12-07 DIAGNOSIS — F3341 Major depressive disorder, recurrent, in partial remission: Secondary | ICD-10-CM | POA: Diagnosis not present

## 2018-12-07 MED ORDER — SERTRALINE HCL 100 MG PO TABS: 150.0000 mg | ORAL_TABLET | Freq: Every day | ORAL | 1 refills | Status: DC

## 2018-12-07 NOTE — Patient Instructions (Signed)
1. Continue sertraline 150 mg daily  2.Continue hydroxyzine 50 mg daily prn for anxiety 3. Next appointment: 3/15 at 9 AM

## 2018-12-15 ENCOUNTER — Other Ambulatory Visit: Payer: Self-pay

## 2018-12-15 ENCOUNTER — Ambulatory Visit (INDEPENDENT_AMBULATORY_CARE_PROVIDER_SITE_OTHER): Payer: Medicare HMO | Admitting: Rheumatology

## 2018-12-15 ENCOUNTER — Encounter: Payer: Self-pay | Admitting: Rheumatology

## 2018-12-15 VITALS — BP 128/77 | HR 59 | Resp 15 | Ht 62.0 in | Wt 318.6 lb

## 2018-12-15 DIAGNOSIS — Z8669 Personal history of other diseases of the nervous system and sense organs: Secondary | ICD-10-CM

## 2018-12-15 DIAGNOSIS — M0609 Rheumatoid arthritis without rheumatoid factor, multiple sites: Secondary | ICD-10-CM

## 2018-12-15 DIAGNOSIS — Z8719 Personal history of other diseases of the digestive system: Secondary | ICD-10-CM

## 2018-12-15 DIAGNOSIS — E559 Vitamin D deficiency, unspecified: Secondary | ICD-10-CM

## 2018-12-15 DIAGNOSIS — Z79899 Other long term (current) drug therapy: Secondary | ICD-10-CM | POA: Diagnosis not present

## 2018-12-15 DIAGNOSIS — Z96653 Presence of artificial knee joint, bilateral: Secondary | ICD-10-CM | POA: Diagnosis not present

## 2018-12-15 DIAGNOSIS — R7303 Prediabetes: Secondary | ICD-10-CM

## 2018-12-15 DIAGNOSIS — Z8261 Family history of arthritis: Secondary | ICD-10-CM

## 2018-12-15 DIAGNOSIS — I10 Essential (primary) hypertension: Secondary | ICD-10-CM | POA: Diagnosis not present

## 2018-12-15 DIAGNOSIS — I5032 Chronic diastolic (congestive) heart failure: Secondary | ICD-10-CM

## 2018-12-15 DIAGNOSIS — R69 Illness, unspecified: Secondary | ICD-10-CM | POA: Diagnosis not present

## 2018-12-15 DIAGNOSIS — Z8659 Personal history of other mental and behavioral disorders: Secondary | ICD-10-CM

## 2018-12-15 DIAGNOSIS — E890 Postprocedural hypothyroidism: Secondary | ICD-10-CM

## 2018-12-15 DIAGNOSIS — M19071 Primary osteoarthritis, right ankle and foot: Secondary | ICD-10-CM

## 2018-12-15 DIAGNOSIS — Z9889 Other specified postprocedural states: Secondary | ICD-10-CM

## 2018-12-15 DIAGNOSIS — M5030 Other cervical disc degeneration, unspecified cervical region: Secondary | ICD-10-CM | POA: Diagnosis not present

## 2018-12-15 DIAGNOSIS — M19072 Primary osteoarthritis, left ankle and foot: Secondary | ICD-10-CM

## 2018-12-15 MED ORDER — FOLIC ACID 1 MG PO TABS: 2.0000 mg | ORAL_TABLET | Freq: Every day | ORAL | 3 refills | Status: DC

## 2018-12-15 MED ORDER — PREDNISONE 5 MG PO TABS: ORAL_TABLET | ORAL | 0 refills | Status: AC

## 2018-12-15 MED ORDER — "TUBERCULIN SYRINGE 27G X 1/2"" 1 ML MISC": 12.0000 | 3 refills | Status: DC

## 2018-12-15 MED ORDER — METHOTREXATE SODIUM CHEMO INJECTION 50 MG/2ML: INTRAMUSCULAR | 0 refills | Status: DC

## 2018-12-15 NOTE — Progress Notes (Signed)
Pharmacy Note  Subjective: Patient presents today to the [facility] to see Dr. Estanislado Pandy.  Patient seen by the pharmacist for counseling on methotrexate for rheumatoid arthritis. She is naive to therapy.  Objective: CBC    Component Value Date/Time   WBC 7.7 09/09/2018 0851   RBC 5.01 09/09/2018 0851   HGB 11.6 (L) 09/09/2018 0851   HCT 37.3 09/09/2018 0851   PLT 424 (H) 09/09/2018 0851   MCV 74.5 (L) 09/09/2018 0851   MCH 23.2 (L) 09/09/2018 0851   MCHC 31.1 (L) 09/09/2018 0851   RDW 14.2 09/09/2018 0851   LYMPHSABS 3.0 08/14/2017 1139   MONOABS 0.4 08/14/2017 1139   EOSABS 0.1 08/14/2017 1139   BASOSABS 0.0 08/14/2017 1139    CMP     Component Value Date/Time   NA 139 10/22/2018 1049   K 4.1 10/22/2018 1049   CL 103 10/22/2018 1049   CO2 30 10/22/2018 1049   GLUCOSE 88 10/22/2018 1049   BUN 16 10/22/2018 1049   CREATININE 0.65 10/22/2018 1049   CALCIUM 8.9 10/22/2018 1049   PROT 7.7 11/30/2018 1013   ALBUMIN 4.0 08/14/2017 1139   AST 13 04/14/2018 0858   ALT 10 04/14/2018 0858   ALKPHOS 81 08/14/2017 1139   BILITOT 0.5 04/14/2018 0858   GFRNONAA 100 10/22/2018 1049   GFRAA 116 10/22/2018 1049    Baseline Immunosuppressant Therapy Labs TB GOLD Quantiferon TB Gold Latest Ref Rng & Units 11/30/2018  Quantiferon TB Gold Plus NEGATIVE NEGATIVE   Hepatitis Panel Hepatitis Latest Ref Rng & Units 11/30/2018  Hep B Surface Ag NON-REACTI NON-REACTIVE  Hep B IgM NON-REACTI NON-REACTIVE  Hep C Ab 0.0 - 0.9 s/co ratio -  Hep C Ab NON-REACTI NON-REACTIVE  Hep C Ab NON-REACTI NON-REACTIVE   HIV Lab Results  Component Value Date   HIV NON-REACTIVE 11/30/2018   HIV Non Reactive 11/25/2014   Immunoglobulins Immunoglobulin Electrophoresis Latest Ref Rng & Units 11/30/2018  IgA  47 - 310 mg/dL 245  IgG 600 - 1,640 mg/dL 1,793(H)  IgM 50 - 300 mg/dL 143   SPEP Serum Protein Electrophoresis Latest Ref Rng & Units 11/30/2018  Total Protein 6.1 - 8.1 g/dL  7.7  Albumin 3.8 - 4.8 g/dL 3.7(L)  Alpha-1 0.2 - 0.3 g/dL 0.5(H)  Alpha-2 0.5 - 0.9 g/dL 0.9  Beta Globulin 0.4 - 0.6 g/dL 0.5  Beta 2 0.2 - 0.5 g/dL 0.5  Gamma Globulin 0.8 - 1.7 g/dL 1.6   G6PD Lab Results  Component Value Date   G6PDH 13.4 11/30/2018   TPMT No results found for: TPMT   Chest-xray:  No active cardiopulmonary disease 11/25/2014  Contraception: hysterectomy  Alcohol use: infrequent  Assessment/Plan:   Patient was counseled on the purpose, proper use, and adverse effects of methotrexate including nausea, infection, and signs and symptoms of pneumonitis. Discussed that there is the possibility of an increased risk of malignancy, specifically lymphomas, but it is not well understood if this increased risk is due to the medication or the disease state.  Instructed patient that medication should be held for infection and prior to surgery.  Advised patient to avoid live vaccines. Recommend annual influenza, Pneumovax 23, Prevnar 13, and Shingrix as indicated.   Reviewed instructions with patient to take methotrexate weekly along with folic acid daily.  Discussed the importance of frequent monitoring of kidney and liver function and blood counts, and provided patient with standing lab instructions.  Counseled patient to avoid NSAIDs and alcohol while on methotrexate.  Provided patient with educational materials on methotrexate and answered all questions.   Patient voiced understanding.  Patient consented to methotrexate use.  Will upload into chart.    Dose of methotrexate will be 0.6 ml every 7 days for 2 weeks then 0.8 ml every 7 days as tolerated along with folic acid 2 mg daily.  Educated patient on how to use a vial and syringe and reviewed injection technique with patient.  Patient was able to demonstrate proper technique for injections using vial and syringe.  Provided patient educational material regarding injection technique and storage of methotrexate.    All  questions encouraged and answered.  Instructed patient to call with any questions or concerns.   Verlin Fester, PharmD, [place], CPP Clinical Specialty Pharmacist [phone]  12/15/2018 4:32 PM

## 2018-12-15 NOTE — Patient Instructions (Signed)
Standing Labs We placed an order today for your standing lab work.    Please come back and get your standing labs in 2 weeks, 4 weeks, 8 weeks, then every 3 months.  We have open lab daily Monday through Thursday from 8:30-12:30 PM and 1:30-4:30 PM and Friday from 8:30-12:30 PM and 1:30 -4:00 PM at the office of Dr. [name].   You may experience shorter wait times on Monday and Friday afternoons. The office is located at [address], [address], [place], [place] [address] No appointment is necessary.   Labs are drawn by [facility].  You may receive a bill from [facility] for your lab work.  If you wish to have your labs drawn at another location, please call the office 24 hours in advance to send orders.  If you have any questions regarding directions or hours of operation,  please call [phone].   Just as a reminder please drink plenty of water prior to coming for your lab work. Thanks!  Vaccines You are taking a medication(s) that can suppress your immune system.  The following immunizations are recommended: . Flu annually . Pneumonia (Pneumovax 23 and Prevnar 13 spaced at least 1 year apart) . Shingrix  Please check with your PCP to make sure you are up to date.  Methotrexate tablets What is this medicine? METHOTREXATE (METH oh TREX ate) is a chemotherapy drug used to treat cancer including breast cancer, leukemia, and lymphoma. This medicine can also be used to treat psoriasis and certain kinds of arthritis. This medicine may be used for other purposes; ask your health care provider or pharmacist if you have questions. COMMON BRAND NAME(S): Rheumatrex, Trexall What should I tell my health care provider before I take this medicine? They need to know if you have any of these conditions:  fluid in the stomach area or lungs  if you often drink alcohol  infection or immune system problems  kidney disease or on hemodialysis  liver disease  low blood counts, like low  white cell, platelet, or red cell counts  lung disease  radiation therapy  stomach ulcers  ulcerative colitis  an unusual or allergic reaction to methotrexate, other medicines, foods, dyes, or preservatives  pregnant or trying to get pregnant  breast-feeding How should I use this medicine? Take this medicine by mouth with a glass of water. Follow the directions on the prescription label. Take your medicine at regular intervals. Do not take it more often than directed. Do not stop taking except on your doctor's advice. Make sure you know why you are taking this medicine and how often you should take it. If this medicine is used for a condition that is not cancer, like arthritis or psoriasis, it should be taken weekly, NOT daily. Taking this medicine more often than directed can cause serious side effects, even death. Talk to your healthcare provider about safe handling and disposal of this medicine. You may need to take special precautions. Talk to your pediatrician regarding the use of this medicine in children. While this drug may be prescribed for selected conditions, precautions do apply. Overdosage: If you think you have taken too much of this medicine contact a poison control center or emergency room at once. NOTE: This medicine is only for you. Do not share this medicine with others. What if I miss a dose? If you miss a dose, talk with your doctor or health care professional. Do not take double or extra doses. What may interact with this  medicine? This medicine may interact with the following medication:  acitretin  aspirin and aspirin-like medicines including salicylates  azathioprine  certain antibiotics like penicillins, tetracycline, and chloramphenicol  cyclosporine  gold  hydroxychloroquine  live virus vaccines  NSAIDs, medicines for pain and inflammation, like ibuprofen or naproxen  other cytotoxic  agents  penicillamine  phenylbutazone  phenytoin  probenecid  retinoids such as isotretinoin and tretinoin  steroid medicines like prednisone or cortisone  sulfonamides like sulfasalazine and trimethoprim/sulfamethoxazole  theophylline This list may not describe all possible interactions. Give your health care provider a list of all the medicines, herbs, non-prescription drugs, or dietary supplements you use. Also tell them if you smoke, drink alcohol, or use illegal drugs. Some items may interact with your medicine. What should I watch for while using this medicine? Avoid alcoholic drinks. This medicine can make you more sensitive to the sun. Keep out of the sun. If you cannot avoid being in the sun, wear protective clothing and use sunscreen. Do not use sun lamps or tanning beds/booths. You may need blood work done while you are taking this medicine. Call your doctor or health care professional for advice if you get a fever, chills or sore throat, or other symptoms of a cold or flu. Do not treat yourself. This drug decreases your body's ability to fight infections. Try to avoid being around people who are sick. This medicine may increase your risk to bruise or bleed. Call your doctor or health care professional if you notice any unusual bleeding. Check with your doctor or health care professional if you get an attack of severe diarrhea, nausea and vomiting, or if you sweat a lot. The loss of too much body fluid can make it dangerous for you to take this medicine. Talk to your doctor about your risk of cancer. You may be more at risk for certain types of cancers if you take this medicine. Both men and women must use effective birth control with this medicine. Do not become pregnant while taking this medicine or until at least 1 normal menstrual cycle has occurred after stopping it. Women should inform their doctor if they wish to become pregnant or think they might be pregnant. Men should  not father a child while taking this medicine and for 3 months after stopping it. There is a potential for serious side effects to an unborn child. Talk to your health care professional or pharmacist for more information. Do not breast-feed an infant while taking this medicine. What side effects may I notice from receiving this medicine? Side effects that you should report to your doctor or health care professional as soon as possible:  allergic reactions like skin rash, itching or hives, swelling of the face, lips, or tongue  breathing problems or shortness of breath  diarrhea  dry, nonproductive cough  low blood counts - this medicine may decrease the number of white blood cells, red blood cells and platelets. You may be at increased risk for infections and bleeding.  mouth sores  redness, blistering, peeling or loosening of the skin, including inside the mouth  signs of infection - fever or chills, cough, sore throat, pain or trouble passing urine  signs and symptoms of bleeding such as bloody or black, tarry stools; red or dark-brown urine; spitting up blood or brown material that looks like coffee grounds; red spots on the skin; unusual bruising or bleeding from the eye, gums, or nose  signs and symptoms of kidney injury like trouble passing  or change in the amount of urine  signs and symptoms of liver injury like dark yellow or brown urine; general ill feeling or flu-like symptoms; light-colored stools; loss of appetite; nausea; right upper belly pain; unusually weak or tired; yellowing of the eyes or skin Side effects that usually do not require medical attention (report to your doctor or health care professional if they continue or are bothersome):  dizziness  hair loss  tiredness  upset stomach  vomiting This list may not describe all possible side effects. Call your doctor for medical advice about side effects. You may report side effects to FDA at  1-800-FDA-1088. Where should I keep my medicine? Keep out of the reach of children. Store at room temperature between 20 and 25 degrees C (68 and 77 degrees F). Protect from light. Throw away any unused medicine after the expiration date. NOTE: This sheet is a summary. It may not cover all possible information. If you have questions about this medicine, talk to your doctor, pharmacist, or health care provider.  2020 Elsevier/Gold Standard (2016-10-24 13:38:43)    

## 2018-12-23 ENCOUNTER — Ambulatory Visit: Payer: Medicare HMO | Admitting: Rheumatology

## 2018-12-30 ENCOUNTER — Other Ambulatory Visit: Payer: Self-pay

## 2018-12-30 ENCOUNTER — Ambulatory Visit: Payer: Medicare HMO | Admitting: Family Medicine

## 2018-12-30 DIAGNOSIS — Z79899 Other long term (current) drug therapy: Secondary | ICD-10-CM | POA: Diagnosis not present

## 2018-12-30 LAB — CBC WITH DIFFERENTIAL/PLATELET
Absolute Monocytes: 706 cells/uL (ref 200–950)
Basophils Absolute: 90 cells/uL (ref 0–200)
Basophils Relative: 0.8 %
Eosinophils Absolute: 134 cells/uL (ref 15–500)
Eosinophils Relative: 1.2 %
HCT: 38.3 % (ref 35.0–45.0)
Hemoglobin: 11.9 g/dL (ref 11.7–15.5)
Lymphs Abs: 4738 cells/uL — ABNORMAL HIGH (ref 850–3900)
MCH: 23.4 pg — ABNORMAL LOW (ref 27.0–33.0)
MCHC: 31.1 g/dL — ABNORMAL LOW (ref 32.0–36.0)
MCV: 75.4 fL — ABNORMAL LOW (ref 80.0–100.0)
MPV: 9.8 fL (ref 7.5–12.5)
Monocytes Relative: 6.3 %
Neutro Abs: 5533 cells/uL (ref 1500–7800)
Neutrophils Relative %: 49.4 %
Platelets: 486 10*3/uL — ABNORMAL HIGH (ref 140–400)
RBC: 5.08 10*6/uL (ref 3.80–5.10)
RDW: 14.3 % (ref 11.0–15.0)
Total Lymphocyte: 42.3 %
WBC: 11.2 10*3/uL — ABNORMAL HIGH (ref 3.8–10.8)

## 2018-12-30 LAB — COMPLETE METABOLIC PANEL WITH GFR
AG Ratio: 1.2 (calc) (ref 1.0–2.5)
ALT: 11 U/L (ref 6–29)
AST: 13 U/L (ref 10–35)
Albumin: 4.1 g/dL (ref 3.6–5.1)
Alkaline phosphatase (APISO): 73 U/L (ref 37–153)
BUN/Creatinine Ratio: 29 (calc) — ABNORMAL HIGH (ref 6–22)
BUN: 26 mg/dL — ABNORMAL HIGH (ref 7–25)
CO2: 29 mmol/L (ref 20–32)
Calcium: 9.4 mg/dL (ref 8.6–10.4)
Chloride: 100 mmol/L (ref 98–110)
Creat: 0.91 mg/dL (ref 0.50–1.05)
GFR, Est African American: 82 mL/min/{1.73_m2} (ref >=60)
GFR, Est Non African American: 71 mL/min/{1.73_m2} (ref >=60)
Globulin: 3.4 g/dL (calc) (ref 1.9–3.7)
Glucose, Bld: 96 mg/dL (ref 65–99)
Potassium: 4.6 mmol/L (ref 3.5–5.3)
Sodium: 138 mmol/L (ref 135–146)
Total Bilirubin: 0.4 mg/dL (ref 0.2–1.2)
Total Protein: 7.5 g/dL (ref 6.1–8.1)

## 2018-12-30 NOTE — Progress Notes (Signed)
Office Visit Note  Patient: [name]             Date of Birth: 08-Mar-1961           MRN: [id number]             PCP: Fayrene Helper, MD Referring: Fayrene Helper, MD Visit Date: 01/13/2019 Occupation: @GUAROCC @  Subjective:  Medication monitoring   History of Present Illness: [name] is a 58 y.o. female with history of seronegative rheumatoid arthritis, osteoarthritis, and DDD.  She was started on methotrexate sq injections once weekly after her office visit on 12/15/2018. she has been injecting MTX 0.8 ml sq for the past 2 weeks. She has been tolerating methotrexate without any side effects.  She has not had any difficulty with the injections.  She has started to notice improvement in the joint swelling in both hands.  She continues to have some discomfort in bilateral third MCP joints.  She has also had less morning stiffness over the past 1 week.  She states that 1 week ago she rolled her ankle and was evaluated in the emergency department 3 days ago.  She states that she was told she may have fractured it or may have a bad sprain.  She was put in a flat shoe and advised to wear for 3 weeks and follow-up with her PCP if her pain persists or worsens.  She continues to have discomfort and does not feel as though the discomfort has improved yet.   Activities of Daily Living:  Patient reports morning stiffness for 2 minutes.   Patient Denies nocturnal pain.  Difficulty dressing/grooming: Denies Difficulty climbing stairs: Reports Difficulty getting out of chair: Denies Difficulty using hands for taps, buttons, cutlery, and/or writing: Denies  Review of Systems  Constitutional: Negative for fatigue.  HENT: Negative for mouth sores, mouth dryness and nose dryness.   Eyes: Negative for pain, visual disturbance and dryness.  Respiratory: Negative for cough, hemoptysis, shortness of breath and difficulty breathing.   Cardiovascular: Negative for chest pain,  palpitations, hypertension and swelling in legs/feet.  Gastrointestinal: Negative for blood in stool, constipation and diarrhea.  Endocrine: Negative for increased urination.  Genitourinary: Negative for painful urination.  Musculoskeletal: Positive for arthralgias, joint pain and morning stiffness. Negative for joint swelling, myalgias, muscle weakness, muscle tenderness and myalgias.  Skin: Negative for color change, pallor, rash, hair loss, nodules/bumps, skin tightness, ulcers and sensitivity to sunlight.  Allergic/Immunologic: Negative for susceptible to infections.  Neurological: Negative for dizziness, numbness, headaches and weakness.  Hematological: Negative for swollen glands.  Psychiatric/Behavioral: Negative for depressed mood and sleep disturbance. The patient is not nervous/anxious.     PMFS History:  Patient Active Problem List   Diagnosis Date Noted  . High risk medication use 01/07/2019  . Generalized joint pain 10/20/2018  . Nausea 10/20/2018  . Recurrent major depressive disorder, in full remission ([place]) 04/10/2018  . Allergic dermatitis 02/02/2018  . Major depressive disorder, recurrent episode, moderate ([place]) 08/29/2017  . Back pain with sciatica 11/13/2015  . Prediabetes 07/31/2015  . Knee osteoarthritis 07/13/2013  . Chronic diastolic heart failure ([place]) 01/06/2013  . Snoring disorder 01/03/2013  . Thoracic spine pain 12/29/2012  . Vitamin D deficiency 12/08/2012  . GERD (gastroesophageal reflux disease) 07/15/2012  . Exercise intolerance 11/26/2011  . Essential hypertension 11/26/2011  . Migraine headache   . 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 Anemia 07/03/2011  . Morbid obesity ([place]) 12/03/2007    Past  Medical History:  Diagnosis Date  . Chronic back pain   . Degenerative joint disease    Left TKA in 07/2009-Dr. Landau  . Depression 2001   doesn't take any meds  . Headache(784.0)    occasionally  . Hypothyroidism    was on Levothyroxine-has  been off x 4 months  . Joint pain   . Joint swelling   . Morbid obesity (HCC)   . Nocturia   . Osteoarthritis of right knee 07/13/2013  . Peripheral edema    to right leg;takes Furosemide occasionally and hasnt had one in a month  . Syncope    with palpitations; evaluated by [facility] in 2008 with normal echo    Family History  Problem Relation Age of Onset  . Diabetes Mother   . Depression Mother   . Diabetes Father        emphsema   . Hyperlipidemia Father   . Emphysema Father   . Rheum arthritis Sister   . Healthy Daughter   . Healthy Daughter   . Healthy Daughter   . Colon cancer Neg Hx    Past Surgical History:  Procedure Laterality Date  . ABDOMINAL HYSTERECTOMY  1995    no oophorectomy  . CARPAL TUNNEL RELEASE  1991   Bilateral  . CESAREAN SECTION     X2  . CHOLECYSTECTOMY    . CHOLECYSTECTOMY, LAPAROSCOPIC  2007   Dr. Lovell Sheehan  . COLONOSCOPY N/A 03/03/2013   Procedure: COLONOSCOPY;  Surgeon: Malissa Hippo, MD;  Location: [facility];  Service: Endoscopy;  Laterality: N/A;  930  . CRANIECTOMY SUBOCCIPITAL W/ CERVICAL LAMINECTOMY / CHIARI  2007   Repair of Arnold-Chiari malformation  . EYE SURGERY     bilateral cataract surgery  . JOINT REPLACEMENT Left 2011  . KNEE ARTHROSCOPY  1995   Dr. Cleophas Dunker   . THYROIDECTOMY, PARTIAL  2004   Left lobectomy - large benign nodule  . TONSILLECTOMY    . TOTAL KNEE ARTHROPLASTY  07/2009   Left knee , Dr. Dion Saucier   . TOTAL KNEE ARTHROPLASTY Right 07/13/2013   Procedure: TOTAL KNEE ARTHROPLASTY;  Surgeon: Eulas Post, MD;  Location: [facility] OR;  Service: Orthopedics;  Laterality: Right;   Social History   Social History Narrative   Is in a new relationship    Immunization History  Administered Date(s) Administered  . Influenza Split 01/16/2012  . Influenza Whole 02/23/2007, 12/07/2008  . Influenza,inj,Quad PF,6+ Mos 12/07/2012, 02/15/2014, 11/28/2014, 01/09/2016, 04/07/2017, 12/18/2017  .  Pneumococcal Polysaccharide-23 07/31/2015  . Td 10/23/2005     Objective: Vital Signs: BP 122/75 (BP Location: Left Wrist, Patient Position: Sitting, Cuff Size: Normal)   Pulse 60   Resp 15   Ht 5\' 2"  (1.575 m)   Wt (!) 321 lb (145.6 kg)   BMI 58.71 kg/m    Physical Exam Vitals signs and nursing note reviewed.  Constitutional:      Appearance: She is well-developed.  HENT:     Head: Normocephalic and atraumatic.  Eyes:     Conjunctiva/sclera: Conjunctivae normal.  Neck:     Musculoskeletal: Normal range of motion.  Cardiovascular:     Rate and Rhythm: Normal rate and regular rhythm.     Heart sounds: Normal heart sounds.  Pulmonary:     Effort: Pulmonary effort is normal.     Breath sounds: Normal breath sounds.  Abdominal:     General: Bowel sounds are normal.     Palpations: Abdomen is  soft.  Lymphadenopathy:     Cervical: No cervical adenopathy.  Skin:    General: Skin is warm and dry.     Capillary Refill: Capillary refill takes less than 2 seconds.  Neurological:     Mental Status: She is alert and oriented to person, place, and time.  Psychiatric:        Behavior: Behavior normal.      Musculoskeletal Exam: C-spine good range of motion.  Thoracic kyphosis noted.  Lumbar spine difficult to assess 1 seated position.  Shoulder joints and elbow joints have good range of motion without tenderness or inflammation.  She has tenderness and inflammation of the right wrist joint.  Extensor tenosynovitis of the right wrist noted.  She has tenderness and synovitis of bilateral third MCP joints.  Synovitis of the right third PIP joint.  Bilateral knee replacements have good range of motion with no warmth or effusion.  Ankle joints have good range of motion with no tenderness or inflammation.  She has pedal edema bilaterally.  CDAI Exam: CDAI Score: 9.2  Patient Global: 6 mm; Provider Global: 6 mm Swollen: 4 ; Tender: 4  Joint Exam      Right  Left  Wrist  Swollen Tender      MCP 3  Swollen Tender  Swollen Tender  PIP 3  Swollen Tender        Investigation: No additional findings.  Imaging: No results found.  Recent Labs: Lab Results  Component Value Date   WBC 11.2 (H) 12/30/2018   HGB 11.9 12/30/2018   PLT 486 (H) 12/30/2018   NA 138 12/30/2018   K 4.6 12/30/2018   CL 100 12/30/2018   CO2 29 12/30/2018   GLUCOSE 96 12/30/2018   BUN 26 (H) 12/30/2018   CREATININE 0.91 12/30/2018   BILITOT 0.4 12/30/2018   ALKPHOS 81 08/14/2017   AST 13 12/30/2018   ALT 11 12/30/2018   PROT 7.5 12/30/2018   ALBUMIN 4.0 08/14/2017   CALCIUM 9.4 12/30/2018   GFRAA 82 12/30/2018   QFTBGOLDPLUS NEGATIVE 11/30/2018    Speciality Comments: No specialty comments available.  Procedures:  No procedures performed Allergies: Aleve [naproxen sodium], B12 folate [cobalamin combinations], Penicillins, Adhesive [tape], Phentermine, and Latex   Assessment / Plan:     Visit Diagnoses: Rheumatoid arthritis of multiple sites with negative rheumatoid factor (HCC) - RF negative, anti-CCP negative, patient has severe synovitis and radiographic changes with MCP narrowing and juxta-articular osteopenia: She continues to have synovitis and extensor tenosynovitis as described above.  She was started on methotrexate 0.6 mL subcu injections once weekly about 1 month ago.  She has been injecting methotrexate 0.8 for the last 2 weeks.  She has started to notice improvement while on methotrexate.  She has been tolerating methotrexate without any side effects.  She has noticed last joint inflammation as well as less morning stiffness.  She will continue on his current treatment regimen.  She will follow-up in the office in 2 months.  If she continues to have active inflammation we will consider increasing the dose of methotrexate to 1.0 mL sq injection once weekly or adding on another medication in combination with methotrexate.  She was advised to notify us if she develops increased joint  pain or joint swelling in the meantime.  High risk medication use - Methotrexate 0.8 ml every 7 days (started in September 2020) and folic acid 1 mg 2 tablets daily.  Most recent CBC/CMP within normal limits except for elevated WBC  and platelets on 12/30/2018.  Due for CBC/CMP today.  - Plan: CBC with Differential/Platelet, COMPLETE METABOLIC PANEL WITH GFR  Status post total bilateral knee replacement: Doing well.  She has good ROM with no discomfort.  She has difficulty going down steps but no difficulty getting up from a chair.   Primary osteoarthritis of both feet: She rolled her right foot 1 week ago, and she was evaluated in the ED 3 days ago.  According to the patient, she was told they were unsure if there was a hairline fracture or a severe sprain.  She will be wearing a flat shoe for the next 3 weeks.  She was advised to follow up if her symptoms persist or worsen.    DDD (degenerative disc disease), cervical: She has good ROM with no discomfort at this time.  No symptoms of radiculopathy.   Other medical conditions are listed as follows:   Family history of rheumatoid arthritis  Vitamin D deficiency  Chronic diastolic heart failure (HCC)  Essential hypertension  Prediabetes  History of gastroesophageal reflux (GERD)  History of depression  Hx of migraines  S/P partial thyroidectomy  Orders: Orders Placed This Encounter  Procedures  . CBC with Differential/Platelet  . COMPLETE METABOLIC PANEL WITH GFR   No orders of the defined types were placed in this encounter.     Follow-Up Instructions: Return in about 2 months (around 03/15/2019) for Rheumatoid arthritis, Osteoarthritis, DDD.   Gearldine Bienenstock, PA-C  I examined and evaluated the patient with Sherron Ales PA.  Patient is clinically doing better although she still has some synovitis and tenosynovitis.  She will be back in couple of months to see the efficacy of methotrexate.  If she has an adequate response we  can increase the dose of methotrexate to 1 mL subcu weekly or add to Biologics.  She was in agreement.  The plan of care was discussed as noted above.  [name], MD Note - This record has been created using Animal nutritionist.  Chart creation errors have been sought, but may not always  have been located. Such creation errors do not reflect on  the standard of medical care.

## 2018-12-31 NOTE — Progress Notes (Signed)
Labs are stable.

## 2019-01-07 ENCOUNTER — Ambulatory Visit: Payer: Medicare HMO | Admitting: Family Medicine

## 2019-01-07 DIAGNOSIS — Z79899 Other long term (current) drug therapy: Secondary | ICD-10-CM | POA: Insufficient documentation

## 2019-01-10 DIAGNOSIS — S93601A Unspecified sprain of right foot, initial encounter: Secondary | ICD-10-CM | POA: Diagnosis not present

## 2019-01-10 DIAGNOSIS — M7989 Other specified soft tissue disorders: Secondary | ICD-10-CM | POA: Diagnosis not present

## 2019-01-10 DIAGNOSIS — M19071 Primary osteoarthritis, right ankle and foot: Secondary | ICD-10-CM | POA: Diagnosis not present

## 2019-01-10 DIAGNOSIS — Z91040 Latex allergy status: Secondary | ICD-10-CM | POA: Diagnosis not present

## 2019-01-10 DIAGNOSIS — X501XXA Overexertion from prolonged static or awkward postures, initial encounter: Secondary | ICD-10-CM | POA: Diagnosis not present

## 2019-01-10 DIAGNOSIS — M069 Rheumatoid arthritis, unspecified: Secondary | ICD-10-CM | POA: Diagnosis not present

## 2019-01-10 DIAGNOSIS — S93401A Sprain of unspecified ligament of right ankle, initial encounter: Secondary | ICD-10-CM | POA: Diagnosis not present

## 2019-01-10 DIAGNOSIS — Z880 Allergy status to penicillin: Secondary | ICD-10-CM | POA: Diagnosis not present

## 2019-01-10 DIAGNOSIS — I10 Essential (primary) hypertension: Secondary | ICD-10-CM | POA: Diagnosis not present

## 2019-01-10 DIAGNOSIS — W1842XA Slipping, tripping and stumbling without falling due to stepping into hole or opening, initial encounter: Secondary | ICD-10-CM | POA: Diagnosis not present

## 2019-01-10 DIAGNOSIS — Z7984 Long term (current) use of oral hypoglycemic drugs: Secondary | ICD-10-CM | POA: Diagnosis not present

## 2019-01-10 DIAGNOSIS — Z79899 Other long term (current) drug therapy: Secondary | ICD-10-CM | POA: Diagnosis not present

## 2019-01-13 ENCOUNTER — Ambulatory Visit (INDEPENDENT_AMBULATORY_CARE_PROVIDER_SITE_OTHER): Payer: Medicare HMO | Admitting: Rheumatology

## 2019-01-13 ENCOUNTER — Other Ambulatory Visit: Payer: Self-pay

## 2019-01-13 ENCOUNTER — Encounter: Payer: Self-pay | Admitting: Rheumatology

## 2019-01-13 VITALS — BP 122/75 | HR 60 | Resp 15 | Ht 62.0 in | Wt 321.0 lb

## 2019-01-13 DIAGNOSIS — Z8659 Personal history of other mental and behavioral disorders: Secondary | ICD-10-CM

## 2019-01-13 DIAGNOSIS — Z79899 Other long term (current) drug therapy: Secondary | ICD-10-CM | POA: Diagnosis not present

## 2019-01-13 DIAGNOSIS — M19071 Primary osteoarthritis, right ankle and foot: Secondary | ICD-10-CM | POA: Diagnosis not present

## 2019-01-13 DIAGNOSIS — Z8719 Personal history of other diseases of the digestive system: Secondary | ICD-10-CM

## 2019-01-13 DIAGNOSIS — I10 Essential (primary) hypertension: Secondary | ICD-10-CM | POA: Diagnosis not present

## 2019-01-13 DIAGNOSIS — M0609 Rheumatoid arthritis without rheumatoid factor, multiple sites: Secondary | ICD-10-CM

## 2019-01-13 DIAGNOSIS — Z96653 Presence of artificial knee joint, bilateral: Secondary | ICD-10-CM

## 2019-01-13 DIAGNOSIS — I5032 Chronic diastolic (congestive) heart failure: Secondary | ICD-10-CM

## 2019-01-13 DIAGNOSIS — M5030 Other cervical disc degeneration, unspecified cervical region: Secondary | ICD-10-CM | POA: Diagnosis not present

## 2019-01-13 DIAGNOSIS — E559 Vitamin D deficiency, unspecified: Secondary | ICD-10-CM

## 2019-01-13 DIAGNOSIS — Z8669 Personal history of other diseases of the nervous system and sense organs: Secondary | ICD-10-CM

## 2019-01-13 DIAGNOSIS — R7303 Prediabetes: Secondary | ICD-10-CM | POA: Diagnosis not present

## 2019-01-13 DIAGNOSIS — M19072 Primary osteoarthritis, left ankle and foot: Secondary | ICD-10-CM

## 2019-01-13 DIAGNOSIS — Z8261 Family history of arthritis: Secondary | ICD-10-CM | POA: Diagnosis not present

## 2019-01-13 DIAGNOSIS — E890 Postprocedural hypothyroidism: Secondary | ICD-10-CM

## 2019-01-13 DIAGNOSIS — Z9889 Other specified postprocedural states: Secondary | ICD-10-CM

## 2019-01-14 LAB — CBC WITH DIFFERENTIAL/PLATELET
Absolute Monocytes: 496 cells/uL (ref 200–950)
Basophils Absolute: 52 cells/uL (ref 0–200)
Basophils Relative: 0.6 %
Eosinophils Absolute: 278 cells/uL (ref 15–500)
Eosinophils Relative: 3.2 %
HCT: 37.4 % (ref 35.0–45.0)
Hemoglobin: 11.6 g/dL — ABNORMAL LOW (ref 11.7–15.5)
Lymphs Abs: 3271 cells/uL (ref 850–3900)
MCH: 23.7 pg — ABNORMAL LOW (ref 27.0–33.0)
MCHC: 31 g/dL — ABNORMAL LOW (ref 32.0–36.0)
MCV: 76.5 fL — ABNORMAL LOW (ref 80.0–100.0)
MPV: 9.9 fL (ref 7.5–12.5)
Monocytes Relative: 5.7 %
Neutro Abs: 4602 cells/uL (ref 1500–7800)
Neutrophils Relative %: 52.9 %
Platelets: 430 10*3/uL — ABNORMAL HIGH (ref 140–400)
RBC: 4.89 10*6/uL (ref 3.80–5.10)
RDW: 14.4 % (ref 11.0–15.0)
Total Lymphocyte: 37.6 %
WBC: 8.7 10*3/uL (ref 3.8–10.8)

## 2019-01-14 LAB — COMPLETE METABOLIC PANEL WITH GFR
AG Ratio: 1.2 (calc) (ref 1.0–2.5)
ALT: 8 U/L (ref 6–29)
AST: 11 U/L (ref 10–35)
Albumin: 4 g/dL (ref 3.6–5.1)
Alkaline phosphatase (APISO): 64 U/L (ref 37–153)
BUN: 21 mg/dL (ref 7–25)
CO2: 29 mmol/L (ref 20–32)
Calcium: 9.4 mg/dL (ref 8.6–10.4)
Chloride: 102 mmol/L (ref 98–110)
Creat: 0.81 mg/dL (ref 0.50–1.05)
GFR, Est African American: 94 mL/min/{1.73_m2} (ref >=60)
GFR, Est Non African American: 81 mL/min/{1.73_m2} (ref >=60)
Globulin: 3.3 g/dL (calc) (ref 1.9–3.7)
Glucose, Bld: 95 mg/dL (ref 65–99)
Potassium: 4.3 mmol/L (ref 3.5–5.3)
Sodium: 139 mmol/L (ref 135–146)
Total Bilirubin: 0.5 mg/dL (ref 0.2–1.2)
Total Protein: 7.3 g/dL (ref 6.1–8.1)

## 2019-01-14 NOTE — Progress Notes (Signed)
Hgb is borderline low. Hct is WNL.  Plts are mildly elevated but stable. Please advise patient to return for lab work in 2 weeks.   Ok to refill MTX 0.8 ml sq once weekly.

## 2019-01-15 ENCOUNTER — Telehealth: Payer: Self-pay | Admitting: *Deleted

## 2019-01-15 DIAGNOSIS — M0609 Rheumatoid arthritis without rheumatoid factor, multiple sites: Secondary | ICD-10-CM

## 2019-01-15 MED ORDER — METHOTREXATE SODIUM CHEMO INJECTION 50 MG/2ML: 20.0000 mg | INTRAMUSCULAR | 0 refills | Status: DC

## 2019-01-15 NOTE — Telephone Encounter (Signed)
-----   Message from Ofilia Neas, PA-C sent at 01/14/2019  8:08 AM EDT ----- CMP WNL

## 2019-01-15 NOTE — Telephone Encounter (Signed)
Notes recorded by Ofilia Neas, PA-C on 01/14/2019 at 8:07 AM EDT  Hgb is borderline low. Hct is WNL. Plts are mildly elevated but stable. Please advise patient to return for lab work in 2 weeks.   Ok to refill MTX 0.8 ml sq once weekly.

## 2019-01-27 ENCOUNTER — Other Ambulatory Visit: Payer: Self-pay

## 2019-01-27 DIAGNOSIS — Z79899 Other long term (current) drug therapy: Secondary | ICD-10-CM

## 2019-01-28 ENCOUNTER — Other Ambulatory Visit: Payer: Self-pay | Admitting: Family Medicine

## 2019-01-28 LAB — COMPLETE METABOLIC PANEL WITH GFR
AG Ratio: 1.2 (calc) (ref 1.0–2.5)
ALT: 9 U/L (ref 6–29)
AST: 12 U/L (ref 10–35)
Albumin: 3.9 g/dL (ref 3.6–5.1)
Alkaline phosphatase (APISO): 68 U/L (ref 37–153)
BUN: 18 mg/dL (ref 7–25)
CO2: 29 mmol/L (ref 20–32)
Calcium: 9.1 mg/dL (ref 8.6–10.4)
Chloride: 102 mmol/L (ref 98–110)
Creat: 0.88 mg/dL (ref 0.50–1.05)
GFR, Est African American: 85 mL/min/{1.73_m2} (ref >=60)
GFR, Est Non African American: 73 mL/min/{1.73_m2} (ref >=60)
Globulin: 3.3 g/dL (calc) (ref 1.9–3.7)
Glucose, Bld: 90 mg/dL (ref 65–99)
Potassium: 4 mmol/L (ref 3.5–5.3)
Sodium: 140 mmol/L (ref 135–146)
Total Bilirubin: 0.3 mg/dL (ref 0.2–1.2)
Total Protein: 7.2 g/dL (ref 6.1–8.1)

## 2019-01-28 LAB — CBC WITH DIFFERENTIAL/PLATELET
Absolute Monocytes: 570 cells/uL (ref 200–950)
Basophils Absolute: 60 cells/uL (ref 0–200)
Basophils Relative: 0.7 %
Eosinophils Absolute: 187 cells/uL (ref 15–500)
Eosinophils Relative: 2.2 %
HCT: 37.4 % (ref 35.0–45.0)
Hemoglobin: 11.3 g/dL — ABNORMAL LOW (ref 11.7–15.5)
Lymphs Abs: 3528 cells/uL (ref 850–3900)
MCH: 23.5 pg — ABNORMAL LOW (ref 27.0–33.0)
MCHC: 30.2 g/dL — ABNORMAL LOW (ref 32.0–36.0)
MCV: 77.8 fL — ABNORMAL LOW (ref 80.0–100.0)
MPV: 9.9 fL (ref 7.5–12.5)
Monocytes Relative: 6.7 %
Neutro Abs: 4157 cells/uL (ref 1500–7800)
Neutrophils Relative %: 48.9 %
Platelets: 429 10*3/uL — ABNORMAL HIGH (ref 140–400)
RBC: 4.81 10*6/uL (ref 3.80–5.10)
RDW: 14.5 % (ref 11.0–15.0)
Total Lymphocyte: 41.5 %
WBC: 8.5 10*3/uL (ref 3.8–10.8)

## 2019-01-28 NOTE — Progress Notes (Signed)
Anemia noted, which is stable.

## 2019-02-09 ENCOUNTER — Ambulatory Visit (INDEPENDENT_AMBULATORY_CARE_PROVIDER_SITE_OTHER): Payer: Medicare HMO | Admitting: Family Medicine

## 2019-02-09 ENCOUNTER — Other Ambulatory Visit: Payer: Self-pay

## 2019-02-09 VITALS — BP 122/75 | Ht 62.0 in | Wt 318.0 lb

## 2019-02-09 DIAGNOSIS — F331 Major depressive disorder, recurrent, moderate: Secondary | ICD-10-CM

## 2019-02-09 DIAGNOSIS — Z1231 Encounter for screening mammogram for malignant neoplasm of breast: Secondary | ICD-10-CM | POA: Diagnosis not present

## 2019-02-09 DIAGNOSIS — I10 Essential (primary) hypertension: Secondary | ICD-10-CM | POA: Diagnosis not present

## 2019-02-09 DIAGNOSIS — R7303 Prediabetes: Secondary | ICD-10-CM

## 2019-02-09 DIAGNOSIS — R69 Illness, unspecified: Secondary | ICD-10-CM | POA: Diagnosis not present

## 2019-02-09 MED ORDER — HYDROCHLOROTHIAZIDE 25 MG PO TABS: 25.0000 mg | ORAL_TABLET | Freq: Every day | ORAL | 3 refills | Status: DC

## 2019-02-09 MED ORDER — PHENTERMINE HCL 37.5 MG PO TABS: 37.5000 mg | ORAL_TABLET | Freq: Every day | ORAL | 3 refills | Status: DC

## 2019-02-09 MED ORDER — METFORMIN HCL 500 MG PO TABS: 500.0000 mg | ORAL_TABLET | Freq: Every day | ORAL | 3 refills | Status: DC

## 2019-02-09 NOTE — Patient Instructions (Addendum)
Telephone/ virtual follow up in 4 months,call if you need me sooner  Flu vaccine next Monday at 1pm  Pls order microalb , hBA1C for pt to have done next Monday when she comes in, may collect order in office, if able to  Get hBA1c at rheumatology office when she goes tomorrow, pls arrange for this to be done if possible  Mammogram due in January, please schedule  Think about what you will eat, plan ahead. Choose " clean, green, fresh or frozen" over canned, processed or packaged foods which are more sugary, salty and fatty. 70 to 75% of food eaten should be vegetables and fruit. Three meals at set times with snacks allowed between meals, but they must be fruit or vegetables. Aim to eat over a 12 hour period , example 7 am to 7 pm, and STOP after  your last meal of the day. Drink water,generally about 64 ounces per day, no other drink is as healthy. Fruit juice is best enjoyed in a healthy way, by EATING the fruit.  It is important that you exercise regularly at least 30 minutes 5 times a week. If you develop chest pain, have severe difficulty breathing, or feel very tired, stop exercising immediately and seek medical attention

## 2019-02-09 NOTE — Progress Notes (Signed)
Virtual Visit via Telephone Note  I connected with Caylin Y Chanda Busing on 02/09/19 at  4:00 PM EST by telephone and verified that I am speaking with the correct person using two identifiers.  Location: Patient: home Provider: office  I discussed the limitations, risks, security and privacy concerns of performing an evaluation and management service by telephone and the availability of in person appointments. I also discussed with the patient that there may be a patient responsible charge related to this service. The patient expressed understanding and agreed to proceed.   History of Present Illness:   Sprained foot and ankle right 10/19  Seen I ED on 10/25,wore hard shoe for 3 weeks and has been doing home exercise.on a  Bad day pain is 5 , average is  3 Observations/Objective: BP 122/75   Ht 5\' 2"  (1.575 m)   Wt (!) 318 lb (144.2 kg)   BMI 58.16 kg/m  Good communication with no confusion and intact memory. Alert and oriented x 3 No signs of respiratory distress during speech    Assessment and Plan: Essential hypertension Controlled, no change in medication DASH diet and commitment to daily physical activity for a minimum of 30 minutes discussed and encouraged, as a part of hypertension management. The importance of attaining a healthy weight is also discussed.  BP/Weight 02/09/2019 01/13/2019 12/15/2018 11/30/2018 10/20/2018 09/09/2018 04/22/2018  Systolic BP 122 122 128 126 127 118 118  Diastolic BP 75 75 77 76 87 80 80  Wt. (Lbs) 318 321 318.6 315 324 324 344  BMI 58.16 58.71 58.27 57.61 61.22 61.22 65  Some encounter information is confidential and restricted. Go to Review Flowsheets activity to see all data.       Morbid obesity  Patient re-educated about  the importance of commitment to a  minimum of 150 minutes of exercise per week as able.  The importance of healthy food choices with portion control discussed, as well as eating regularly and within a 12 hour window  most days. The need to choose "clean , green" food 50 to 75% of the time is discussed, as well as to make water the primary drink and set a goal of 64 ounces water daily.    Weight /BMI 02/09/2019 01/13/2019 12/15/2018  WEIGHT 318 lb 321 lb 318 lb 9.6 oz  HEIGHT 5\' 2"  5\' 2"  5\' 2"   BMI 58.16 kg/m2 58.71 kg/m2 58.27 kg/m2  Some encounter information is confidential and restricted. Go to Review Flowsheets activity to see all data.      Prediabetes Patient educated about the importance of limiting  Carbohydrate intake , the need to commit to daily physical activity for a minimum of 30 minutes , and to commit weight loss. The fact that changes in all these areas will reduce or eliminate all together the development of diabetes is stressed.   Diabetic Labs Latest Ref Rng & Units 01/27/2019 01/13/2019 12/30/2018 10/22/2018 09/09/2018  HbA1c <5.7 % of total Hgb - - - - 6.2(H)  Microalbumin Not estab mg/dL - - - - -  Micro/Creat Ratio <30 mcg/mg creat - - - - -  Chol <200 mg/dL - - - - -  HDL 01/15/2019 mg/dL - - - - -  Calc LDL mg/dL (calc) - - - - -  Triglycerides <150 mg/dL - - - - -  Creatinine 01/01/2019 - 1.05 mg/dL 12/22/2018 09/11/2018 >78 2.95 6.21   BP/Weight 02/09/2019 01/13/2019 12/15/2018 11/30/2018 10/20/2018 09/09/2018 04/22/2018  Systolic BP 122 122 128 126 127  094 709  Diastolic BP 75 75 77 76 87 80 80  Wt. (Lbs) 318 321 318.6 315 324 324 344  BMI 58.16 58.71 58.27 57.61 61.22 61.22 65  Some encounter information is confidential and restricted. Go to Review Flowsheets activity to see all data.   Foot/eye exam completion dates 07/31/2015  Foot Form Completion Done  Updated lab needed at/ before next visit.     Major depressive disorder, recurrent episode, moderate (HCC) Controlled and managed by Psychiatry  GERD (gastroesophageal reflux disease) Controlled, no change in medication     Follow Up Instructions:    I discussed the assessment and treatment plan with the patient. The patient was  provided an opportunity to ask questions and all were answered. The patient agreed with the plan and demonstrated an understanding of the instructions.   The patient was advised to call back or seek an in-person evaluation if the symptoms worsen or if the condition fails to improve as anticipated.  I provided 25 minutes of non-face-to-face time during this encounter.   Tula Nakayama, MD

## 2019-02-14 ENCOUNTER — Encounter: Payer: Self-pay | Admitting: Family Medicine

## 2019-02-14 NOTE — Assessment & Plan Note (Signed)
Controlled and managed by Psychiatry 

## 2019-02-14 NOTE — Assessment & Plan Note (Signed)
  Patient re-educated about  the importance of commitment to a  minimum of 150 minutes of exercise per week as able.  The importance of healthy food choices with portion control discussed, as well as eating regularly and within a 12 hour window most days. The need to choose "clean , green" food 50 to 75% of the time is discussed, as well as to make water the primary drink and set a goal of 64 ounces water daily.    Weight /BMI 02/09/2019 01/13/2019 12/15/2018  WEIGHT 318 lb 321 lb 318 lb 9.6 oz  HEIGHT 5\' 2"  5\' 2"  5\' 2"   BMI 58.16 kg/m2 58.71 kg/m2 58.27 kg/m2  Some encounter information is confidential and restricted. Go to Review Flowsheets activity to see all data.

## 2019-02-14 NOTE — Assessment & Plan Note (Signed)
Patient educated about the importance of limiting  Carbohydrate intake , the need to commit to daily physical activity for a minimum of 30 minutes , and to commit weight loss. The fact that changes in all these areas will reduce or eliminate all together the development of diabetes is stressed.   Diabetic Labs Latest Ref Rng & Units 01/27/2019 01/13/2019 12/30/2018 10/22/2018 09/09/2018  HbA1c <5.7 % of total Hgb - - - - 6.2(H)  Microalbumin Not estab mg/dL - - - - -  Micro/Creat Ratio <30 mcg/mg creat - - - - -  Chol <200 mg/dL - - - - -  HDL >50 mg/dL - - - - -  Calc LDL mg/dL (calc) - - - - -  Triglycerides <150 mg/dL - - - - -  Creatinine 0.50 - 1.05 mg/dL 0.88 0.81 0.91 0.65 0.76   BP/Weight 02/09/2019 01/13/2019 12/15/2018 11/30/2018 10/20/2018 5/80/9983 05/24/2503  Systolic BP 397 673 419 379 024 097 353  Diastolic BP 75 75 77 76 87 80 80  Wt. (Lbs) 318 321 318.6 315 324 324 344  BMI 58.16 58.71 58.27 57.61 61.22 61.22 65  Some encounter information is confidential and restricted. Go to Review Flowsheets activity to see all data.   Foot/eye exam completion dates 07/31/2015  Foot Form Completion Done  Updated lab needed at/ before next visit.

## 2019-02-14 NOTE — Assessment & Plan Note (Signed)
Controlled, no change in medication  

## 2019-02-14 NOTE — Assessment & Plan Note (Signed)
Controlled, no change in medication DASH diet and commitment to daily physical activity for a minimum of 30 minutes discussed and encouraged, as a part of hypertension management. The importance of attaining a healthy weight is also discussed.  BP/Weight 02/09/2019 01/13/2019 12/15/2018 11/30/2018 10/20/2018 7/51/7001 09/19/9447  Systolic BP 675 916 384 665 993 570 177  Diastolic BP 75 75 77 76 87 80 80  Wt. (Lbs) 318 321 318.6 315 324 324 344  BMI 58.16 58.71 58.27 57.61 61.22 61.22 65  Some encounter information is confidential and restricted. Go to Review Flowsheets activity to see all data.

## 2019-02-16 ENCOUNTER — Ambulatory Visit (INDEPENDENT_AMBULATORY_CARE_PROVIDER_SITE_OTHER): Payer: Medicare HMO

## 2019-02-16 ENCOUNTER — Other Ambulatory Visit: Payer: Self-pay

## 2019-02-16 ENCOUNTER — Ambulatory Visit: Payer: Medicare HMO

## 2019-02-16 DIAGNOSIS — Z23 Encounter for immunization: Secondary | ICD-10-CM

## 2019-02-19 ENCOUNTER — Telehealth: Payer: Self-pay | Admitting: Rheumatology

## 2019-02-19 NOTE — Telephone Encounter (Signed)
Advised patient she is due for labs in January. Patient states she thought she was to get labs every 2 weeks, I explained that it was 2 weeks x2 when increasing dosage of methotrexate. Patient verbalized understanding and will get labs in January.

## 2019-02-19 NOTE — Telephone Encounter (Signed)
Patient going to [facility] in [place] on Monday for labs. Please release orders.

## 2019-02-27 DIAGNOSIS — R69 Illness, unspecified: Secondary | ICD-10-CM | POA: Diagnosis not present

## 2019-03-01 ENCOUNTER — Other Ambulatory Visit: Payer: Self-pay | Admitting: Family Medicine

## 2019-03-10 ENCOUNTER — Telehealth: Payer: Medicare HMO | Admitting: Rheumatology

## 2019-04-03 ENCOUNTER — Other Ambulatory Visit: Payer: Self-pay | Admitting: Rheumatology

## 2019-04-03 DIAGNOSIS — M0609 Rheumatoid arthritis without rheumatoid factor, multiple sites: Secondary | ICD-10-CM

## 2019-04-05 NOTE — Telephone Encounter (Addendum)
Last Visit: 01/13/19 Next Visit: was due December 2020. Message sent to the front to schedule patient.  Labs: 01/27/19 Anemia noted, which is stable  Okay to refill per Dr. Corliss Skains

## 2019-04-06 NOTE — Telephone Encounter (Signed)
Please schedule patient for a follow up visit. Patient was due December 2020. Thanks! 

## 2019-04-07 NOTE — Progress Notes (Signed)
Office Visit Note  Patient: [name]             Date of Birth: December 15, 1960           MRN: [id number]             PCP: Kerri Perches, MD Referring: Kerri Perches, MD Visit Date: 04/08/2019 Occupation: @GUAROCC @  Subjective:  Pain in both hands   History of Present Illness: [name] is a 59 y.o. female with history of seronegative rheumatoid arthritis, osteoarthritis, and DDD.  She is on methotrexate 0.8 mL once weekly and folic acid 2 mg by mouth daily.  She was started on methotrexate in September 2020.  She states that she has noticed 0% improvement since starting on methotrexate.  She has persistent pain and inflammation in both wrist joints and both hands.  She has been having difficulties with ADLs and has been dropping objects more frequently due to the discomfort and weakness in her hands.  She continues to have chronic pain in bilateral knee replacements which is exacerbated by cooler weather temperatures.  She denies any neck pain at this time.  She denies any other joint pain or joint swelling currently.    Activities of Daily Living:  Patient reports joint stiffness all day Patient Reports nocturnal pain.  Difficulty dressing/grooming: Reports Difficulty climbing stairs: Reports Difficulty getting out of chair: Denies Difficulty using hands for taps, buttons, cutlery, and/or writing: Reports  Review of Systems  Constitutional: Positive for fatigue.  HENT: Negative for mouth sores, mouth dryness and nose dryness.   Eyes: Negative for itching and dryness.  Respiratory: Negative for shortness of breath, wheezing and difficulty breathing.   Cardiovascular: Negative for chest pain and palpitations.  Gastrointestinal: Negative for blood in stool, constipation and diarrhea.  Endocrine: Negative for increased urination.  Genitourinary: Positive for nocturia. Negative for difficulty urinating and painful urination.  Musculoskeletal: Positive for  arthralgias, joint pain, joint swelling and morning stiffness.  Skin: Negative for rash.  Allergic/Immunologic: Negative for susceptible to infections.  Neurological: Negative for dizziness, light-headedness, numbness, headaches, memory loss and weakness.  Hematological: Negative for bruising/bleeding tendency.  Psychiatric/Behavioral: Positive for sleep disturbance. Negative for confusion.    PMFS History:  Patient Active Problem List   Diagnosis Date Noted  . High risk medication use 01/07/2019  . Generalized joint pain 10/20/2018  . Nausea 10/20/2018  . Recurrent major depressive disorder, in full remission (HCC) 04/10/2018  . Allergic dermatitis 02/02/2018  . Major depressive disorder, recurrent episode, moderate (HCC) 08/29/2017  . Back pain with sciatica 11/13/2015  . Prediabetes 07/31/2015  . Knee osteoarthritis 07/13/2013  . Chronic diastolic heart failure (HCC) 01/06/2013  . Snoring disorder 01/03/2013  . Thoracic spine pain 12/29/2012  . Vitamin D deficiency 12/08/2012  . Exercise intolerance 11/26/2011  . Essential hypertension 11/26/2011  . Migraine headache   . 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 Anemia 07/03/2011  . Morbid obesity (HCC) 12/03/2007    Past Medical History:  Diagnosis Date  . Chronic back pain   . Degenerative joint disease    Left TKA in 07/2009-Dr. Landau  . Depression 2001   doesn't take any meds  . Headache(784.0)    occasionally  . Hypothyroidism    was on Levothyroxine-has been off x 4 months  . Joint pain   . Joint swelling   . Morbid obesity (HCC)   . Nocturia   . Osteoarthritis of right knee 07/13/2013  . Peripheral edema  to right leg;takes Furosemide occasionally and hasnt had one in a month  . Syncope    with palpitations; evaluated by [facility] in 2008 with normal echo    Family History  Problem Relation Age of Onset  . Diabetes Mother   . Depression Mother   . Diabetes Father        emphsema    . Hyperlipidemia Father   . Emphysema Father   . Rheum arthritis Sister   . Healthy Daughter   . Healthy Daughter   . Healthy Daughter   . Colon cancer Neg Hx    Past Surgical History:  Procedure Laterality Date  . ABDOMINAL HYSTERECTOMY  1995    no oophorectomy  . CARPAL TUNNEL RELEASE  1991   Bilateral  . CESAREAN SECTION     X2  . CHOLECYSTECTOMY    . CHOLECYSTECTOMY, LAPAROSCOPIC  2007   Dr. Lovell Sheehan  . COLONOSCOPY N/A 03/03/2013   Procedure: COLONOSCOPY;  Surgeon: Malissa Hippo, MD;  Location: [facility];  Service: Endoscopy;  Laterality: N/A;  930  . CRANIECTOMY SUBOCCIPITAL W/ CERVICAL LAMINECTOMY / CHIARI  2007   Repair of Arnold-Chiari malformation  . EYE SURGERY     bilateral cataract surgery  . JOINT REPLACEMENT Left 2011  . KNEE ARTHROSCOPY  1995   Dr. Cleophas Dunker   . THYROIDECTOMY, PARTIAL  2004   Left lobectomy - large benign nodule  . TONSILLECTOMY    . TOTAL KNEE ARTHROPLASTY  07/2009   Left knee , Dr. Dion Saucier   . TOTAL KNEE ARTHROPLASTY Right 07/13/2013   Procedure: TOTAL KNEE ARTHROPLASTY;  Surgeon: Eulas Post, MD;  Location: [facility] OR;  Service: Orthopedics;  Laterality: Right;   Social History   Social History Narrative   Is in a new relationship    Immunization History  Administered Date(s) Administered  . Influenza Split 01/16/2012  . Influenza Whole 02/23/2007, 12/07/2008  . Influenza,inj,Quad PF,6+ Mos 12/07/2012, 02/15/2014, 11/28/2014, 01/09/2016, 04/07/2017, 12/18/2017, 02/16/2019  . Pneumococcal Polysaccharide-23 07/31/2015  . Td 10/23/2005     Objective: Vital Signs: BP 128/64 (BP Location: Left Wrist, Patient Position: Sitting, Cuff Size: Normal)   Pulse 69   Resp 15   Ht 5\' 2"  (1.575 m)   Wt (!) 331 lb (150.1 kg)   BMI 60.54 kg/m    Physical Exam Vitals and nursing note reviewed.  Constitutional:      Appearance: She is well-developed.  HENT:     Head: Normocephalic and atraumatic.  Eyes:     Conjunctiva/sclera:  Conjunctivae normal.  Cardiovascular:     Rate and Rhythm: Normal rate and regular rhythm.     Heart sounds: Normal heart sounds.  Pulmonary:     Effort: Pulmonary effort is normal.     Breath sounds: Normal breath sounds.  Abdominal:     General: Bowel sounds are normal.     Palpations: Abdomen is soft.  Musculoskeletal:     Cervical back: Normal range of motion.  Lymphadenopathy:     Cervical: No cervical adenopathy.  Skin:    General: Skin is warm and dry.     Capillary Refill: Capillary refill takes less than 2 seconds.  Neurological:     Mental Status: She is alert and oriented to person, place, and time.  Psychiatric:        Behavior: Behavior normal.      Musculoskeletal Exam: C-spine, thoracic spine, lumbar spine good range of motion.  Shoulder joints and elbow joints have  good range of motion with no tenderness or inflammation.  She has tenderness and synovitis of the right wrist joint.  Extensor tenosynovitis of the right wrist noted.  She has synovitis of right second, third, fourth, and fifth MCP joints.  Tenderness of all MCP and PIP joints noted.  She is synovitis of the left second and third MCP joints.  She has difficulty making a complete fist due to the discomfort she is experiencing.  Hip joints have good range of motion with no discomfort.  Bilateral knee replacements have good range of motion with mild warmth.  Ankle joints have good range of motion with no tenderness or inflammation.  No tenderness over trochanteric bursa bilaterally.  CDAI Exam: CDAI Score: 29.6  Patient Global: 8 mm; Provider Global: 8 mm Swollen: 7 ; Tender: 21  Joint Exam 04/08/2019      Right  Left  Wrist  Swollen Tender     MCP 1   Tender   Tender  MCP 2  Swollen Tender  Swollen Tender  MCP 3  Swollen Tender  Swollen Tender  MCP 4  Swollen Tender   Tender  MCP 5  Swollen Tender   Tender  IP   Tender   Tender  PIP 2   Tender   Tender  PIP 3   Tender   Tender  PIP 4   Tender    Tender  PIP 5   Tender   Tender     Investigation: No additional findings.  Imaging: No results found.  Recent Labs: Lab Results  Component Value Date   WBC 8.5 01/27/2019   HGB 11.3 (L) 01/27/2019   PLT 429 (H) 01/27/2019   NA 140 01/27/2019   K 4.0 01/27/2019   CL 102 01/27/2019   CO2 29 01/27/2019   GLUCOSE 90 01/27/2019   BUN 18 01/27/2019   CREATININE 0.88 01/27/2019   BILITOT 0.3 01/27/2019   ALKPHOS 81 08/14/2017   AST 12 01/27/2019   ALT 9 01/27/2019   PROT 7.2 01/27/2019   ALBUMIN 4.0 08/14/2017   CALCIUM 9.1 01/27/2019   GFRAA 85 01/27/2019   QFTBGOLDPLUS NEGATIVE 11/30/2018    Speciality Comments: No specialty comments available.  Procedures:  No procedures performed Allergies: Aleve [naproxen sodium], B12 folate [cobalamin combinations], Penicillins, Adhesive [tape], Phentermine, and Latex             Assessment / Plan:     Visit Diagnoses: Rheumatoid arthritis of multiple sites with negative rheumatoid factor (HCC) - RF negative, anti-CCP negative, patient has severe synovitis and radiographic changes with MCP narrowing and juxta-articular osteopenia: She has persistent tenderness and synovitis of several MCP joints and the right wrist joint.  Right wrist extensor tenosynovitis noted.  She was started on injectable methotrexate in September 2020.  She has been injecting methotrexate 0.8 mL subcu once weekly and taking folic acid 2 mg by mouth daily.  According to the patient she has noticed 0% improvement since starting on methotrexate.  We discussed increasing her dose of methotrexate to 1 mL subcu once weekly as well as combination therapy.  She will not be a good candidate for anti-TNF's due to questionable history of diastolic heart failure.  We discussed starting her on Orencia 125 mg subcutaneous injections once weekly.  Indications, contraindications, potential side effects of Orencia were discussed.  All questions were addressed and consent was  obtained today.  She will continue on methotrexate as prescribed.  We will apply for Orencia through her insurance  and she will come to the office for the administration of the first injection once approved.  She will follow-up in the office in 4 to 6 weeks.  Medication counseling:  TB Gold: negative 11/30/18 Hepatitis panel: negative 11/30/18 HIV: negative 11/30/18 SPEP: no monoclonal proteins detected 11/30/18  Immunoglobulins: WNL on 11/30/18  Does patient have a diagnosis of COPD? No  Counseled patient that Maureen Chatters is a selective T-cell costimulation blocker indicated for rheumatoid arthritis. Counseled patient on purpose, proper use, and adverse effects of Orencia. The most common adverse effects are increased risk of infections, headache, and injection site reactions.  There is the possibility of an increased risk of malignancy but it is not well understood if this increased risk is due to the medication or the disease state.  Reviewed the importance of regular labs while on Orencia therapy.  Counseled patient that Maureen Chatters should be held prior to scheduled surgery.  Counseled patient to avoid live vaccines while on Orencia.  Advised patient to get annual influenza vaccine and the pneumococcal vaccine as indicated.  Provided patient with medication education material and answered all questions.  Patient consented to [facility].  Will upload consent into patient's chart.  Will apply for Orencia through patient's insurance.  Reviewed storage information for Orencia.  Advised initial injection must be administered in office.    High risk medication use - Methotrexate 0.8 ml every 7 days (started in September 6378) and folic acid 1 mg 2 tablets daily.CBC and CMP were drawn on 01/27/2019.  Status post total bilateral knee replacement: Chronic pain.  She has good range of motion on exam.  Mild warmth noted but no effusion.  She continues have difficulty climbing steps and getting up from a chair.  Primary  osteoarthritis of both feet: She has no feet pain or inflammation at this time.   DDD (degenerative disc disease), cervical: She has good ROM with no discomfort.  No symptoms of radiculopathy at this time.  Chronic diastolic heart failure ([place]): According to the patient she was unaware that she was ever diagnosed with diastolic heart failure.  Upon chart review of an office visit note from 01/06/2013 with Ermalinda Barrios, PA-C, the patient was noted to have evidence of heart failure after being evaluated in the ED on 12/19/18. At that time she had had a mild elevation in BNP and chest x-ray showed mild CHF. Her dyspnea and LE edema improved when placed on Lasix.  She had a 2D echo in April 2014 which was unable to evaluate for diastolic function.  At that time they suspected diastolic heart failure and advised to continue taking Lasix 40 mg daily.  She is no longer under the care of a cardiologist.  She is also not taking Lasix but is taking HCTZ 25 mg daily.  We discussed the risk of a CHF exacerbation if started on anti-TNF therapy.  She would not be a good candidate for anti-TNF's.  Other medical conditions are listed as follows:   Vitamin D deficiency  Family history of rheumatoid arthritis  Essential hypertension  History of gastroesophageal reflux (GERD)  Prediabetes  History of depression  Hx of migraines  S/P partial thyroidectomy  Orders: No orders of the defined types were placed in this encounter.  No orders of the defined types were placed in this encounter.   Face-to-face time spent with patient was 30 minutes. Greater than 50% of time was spent in counseling and coordination of care.  Follow-Up Instructions: Return for Rheumatoid arthritis, Osteoarthritis,  DDD.   Gearldine Bienenstock, PA-C   I examined and evaluated the patient with Sherron Ales PA.  Patient continues to have active synovitis.  She had an adequate response to methotrexate.  She has history of diastolic  congestive heart failure.  We will apply for Orencia.  Indications side effects contraindications were discussed at length.  We will see her back in about 6 weeks.  The plan of care was discussed as noted above.  [name], MD  Note - This record has been created using Animal nutritionist.  Chart creation errors have been sought, but may not always  have been located. Such creation errors do not reflect on  the standard of medical care.

## 2019-04-08 ENCOUNTER — Other Ambulatory Visit: Payer: Self-pay

## 2019-04-08 ENCOUNTER — Encounter: Payer: Self-pay | Admitting: Rheumatology

## 2019-04-08 ENCOUNTER — Ambulatory Visit: Payer: Medicare HMO | Admitting: Rheumatology

## 2019-04-08 ENCOUNTER — Telehealth: Payer: Self-pay | Admitting: Pharmacist

## 2019-04-08 VITALS — BP 128/64 | HR 69 | Resp 15 | Ht 62.0 in | Wt 331.0 lb

## 2019-04-08 DIAGNOSIS — E890 Postprocedural hypothyroidism: Secondary | ICD-10-CM

## 2019-04-08 DIAGNOSIS — Z79899 Other long term (current) drug therapy: Secondary | ICD-10-CM | POA: Diagnosis not present

## 2019-04-08 DIAGNOSIS — M5030 Other cervical disc degeneration, unspecified cervical region: Secondary | ICD-10-CM | POA: Diagnosis not present

## 2019-04-08 DIAGNOSIS — I5032 Chronic diastolic (congestive) heart failure: Secondary | ICD-10-CM

## 2019-04-08 DIAGNOSIS — Z8719 Personal history of other diseases of the digestive system: Secondary | ICD-10-CM | POA: Diagnosis not present

## 2019-04-08 DIAGNOSIS — R7303 Prediabetes: Secondary | ICD-10-CM

## 2019-04-08 DIAGNOSIS — E559 Vitamin D deficiency, unspecified: Secondary | ICD-10-CM | POA: Diagnosis not present

## 2019-04-08 DIAGNOSIS — Z96653 Presence of artificial knee joint, bilateral: Secondary | ICD-10-CM | POA: Diagnosis not present

## 2019-04-08 DIAGNOSIS — I10 Essential (primary) hypertension: Secondary | ICD-10-CM

## 2019-04-08 DIAGNOSIS — Z8261 Family history of arthritis: Secondary | ICD-10-CM

## 2019-04-08 DIAGNOSIS — Z9889 Other specified postprocedural states: Secondary | ICD-10-CM

## 2019-04-08 DIAGNOSIS — M19071 Primary osteoarthritis, right ankle and foot: Secondary | ICD-10-CM | POA: Diagnosis not present

## 2019-04-08 DIAGNOSIS — M0609 Rheumatoid arthritis without rheumatoid factor, multiple sites: Secondary | ICD-10-CM | POA: Diagnosis not present

## 2019-04-08 DIAGNOSIS — Z8659 Personal history of other mental and behavioral disorders: Secondary | ICD-10-CM

## 2019-04-08 DIAGNOSIS — Z8669 Personal history of other diseases of the nervous system and sense organs: Secondary | ICD-10-CM

## 2019-04-08 DIAGNOSIS — M19072 Primary osteoarthritis, left ankle and foot: Secondary | ICD-10-CM

## 2019-04-08 NOTE — Telephone Encounter (Signed)
Ran test claim, patient's copay for 1 month of Orencia is $502.28. Patient will need to proceed with [facility] Patient Assistance.

## 2019-04-08 NOTE — Progress Notes (Signed)
Pharmacy Note  Subjective: Patient presents today to [facility] for follow up office visit.   Patient seen by the pharmacist for counseling on subcutaneous Orencia rheumatoid arthritis.  Prior therapy includes: injectable methotrexate with no response.  She is not a good candidate for anti-TNF's due to questionable CHF history.  Objective: CBC    Component Value Date/Time   WBC 8.5 01/27/2019 1447   RBC 4.81 01/27/2019 1447   HGB 11.3 (L) 01/27/2019 1447   HCT 37.4 01/27/2019 1447   PLT 429 (H) 01/27/2019 1447   MCV 77.8 (L) 01/27/2019 1447   MCH 23.5 (L) 01/27/2019 1447   MCHC 30.2 (L) 01/27/2019 1447   RDW 14.5 01/27/2019 1447   LYMPHSABS 3,528 01/27/2019 1447   MONOABS 0.4 08/14/2017 1139   EOSABS 187 01/27/2019 1447   BASOSABS 60 01/27/2019 1447    CMP     Component Value Date/Time   NA 140 01/27/2019 1447   K 4.0 01/27/2019 1447   CL 102 01/27/2019 1447   CO2 29 01/27/2019 1447   GLUCOSE 90 01/27/2019 1447   BUN 18 01/27/2019 1447   CREATININE 0.88 01/27/2019 1447   CALCIUM 9.1 01/27/2019 1447   PROT 7.2 01/27/2019 1447   ALBUMIN 4.0 08/14/2017 1139   AST 12 01/27/2019 1447   ALT 9 01/27/2019 1447   ALKPHOS 81 08/14/2017 1139   BILITOT 0.3 01/27/2019 1447   GFRNONAA 73 01/27/2019 1447   GFRAA 85 01/27/2019 1447    Baseline Immunosuppressant Therapy Labs TB GOLD Quantiferon TB Gold Latest Ref Rng & Units 11/30/2018  Quantiferon TB Gold Plus NEGATIVE NEGATIVE   Hepatitis Panel Hepatitis Latest Ref Rng & Units 11/30/2018  Hep B Surface Ag NON-REACTI NON-REACTIVE  Hep B IgM NON-REACTI NON-REACTIVE  Hep C Ab 0.0 - 0.9 s/co ratio -  Hep C Ab NON-REACTI NON-REACTIVE  Hep C Ab NON-REACTI NON-REACTIVE   HIV Lab Results  Component Value Date   HIV NON-REACTIVE 11/30/2018   HIV Non Reactive 11/25/2014   Immunoglobulins Immunoglobulin Electrophoresis Latest Ref Rng & Units 11/30/2018  IgA  47 - 310 mg/dL 725  IgG 366 - 4,403 mg/dL 4,742(V)  IgM  50 - 956 mg/dL 387   SPEP Serum Protein Electrophoresis Latest Ref Rng & Units 01/27/2019  Total Protein 6.1 - 8.1 g/dL 7.2  Albumin 3.8 - 4.8 g/dL -  Alpha-1 0.2 - 0.3 g/dL -  Alpha-2 0.5 - 0.9 g/dL -  Beta Globulin 0.4 - 0.6 g/dL -  Beta 2 0.2 - 0.5 g/dL -  Gamma Globulin 0.8 - 1.7 g/dL -   F6EP Lab Results  Component Value Date   G6PDH 13.4 11/30/2018   TPMT No results found for: TPMT   Chest x-ray:no active cardiopulmonary disease 11/25/2014  Does patient have a diagnosis of COPD? No  Does patient have history of diverticulitis?  No  Assessment/Plan:  Counseled patient that Dub Amis is a selective T-cell costimulation blocker.  Counseled patient on purpose, proper use, and adverse effects of Orencia. The most common adverse effects are increased risk of infections, headache, and injection site reactions.  There is the possibility of an increased risk of malignancy but it is not well understood if this increased risk is due to the medication or the disease state. Reviewed risk of GI perforation which is higher in patients with diverticulitis and diabetes.  Counseled patient that Dub Amis should be held prior to scheduled surgery.  Counseled patient to avoid live vaccines while on Orencia.  Recommend annual influenza,  Pneumovax 23, Prevnar 13, and Shingrix as indicated.   Reviewed the importance of regular labs while on Orencia therapy. Patient will be due for labs 1 month after starting therapy. Standing orders placed. Provided patient with medication education material and answered all questions.  Patient consented to [facility].  Will upload consent into patient's chart.  Will apply for Orencia through patient's insurance.  Reviewed storage information for Orencia.  Advised initial injection must be administered in office.    Patient dose will be Orencia 125 mg every 7 days.  Prescription pending lab results and/or insurance approval.  She is on disability and has Medicare.  She will  likely need patient assistance. Patient given [employer] patient assistance application and will fax back to our office.  She is to continue methotrexate.  All questions encouraged and answered.  Instructed patient to call with any other questions or concerns.   Mariella Saa, PharmD, [place], [place] Clinical Specialty Pharmacist [phone]  04/08/2019 2:57 PM

## 2019-04-08 NOTE — Telephone Encounter (Signed)
Received notification from [facility] regarding a prior authorization for [facility]. Authorization has been APPROVED from 04/08/2019 to 04/08/2019.   Will send document to scan center.  Called to notify patient.  She will fax patient assistance application to our office when completed.    Verlin Fester, PharmD, [place], CPP Clinical Specialty Pharmacist [phone]  04/08/2019 4:06 PM

## 2019-04-08 NOTE — Telephone Encounter (Signed)
Submitted a Prior Authorization request to [employer] Half for [facility] via Cover My Meds. Will update once we receive a response.

## 2019-04-08 NOTE — Telephone Encounter (Signed)
Please start BIV for Orencia for RA.  She has tried injectable methotrexate with no response.  She is not a good candidate for anti-TNF's due to questionable CHF history.  Patient dose will be Orencia 125 mg every 7 days.  Prescription pending lab results and/or insurance approval. She has already consent to [facility] and sent to the scan center.  She is on disability and has Medicare.  She will likely need patient assistance. Patient given [employer] patient assistance application and will fax back to our office.   Verlin Fester, PharmD, [place], CPP Clinical Specialty Pharmacist [phone]  04/08/2019 2:59 PM

## 2019-04-08 NOTE — Patient Instructions (Signed)
Standing Labs We placed an order today for your standing lab work.    Please come back and get your standing labs after starting Orencia in 1 month and then every 3 months. We have open lab daily Monday through Thursday from 8:30-12:30 PM and 1:30-4:30 PM and Friday from 8:30-12:30 PM and 1:30-4:00 PM at the office of Dr. Bo Merino.   You may experience shorter wait times on Monday and Friday afternoons. The office is located at [address], [place], [place], [place] [address] No appointment is necessary.   Labs are drawn by [facility].  You may receive a bill from [place] for your lab work.  If you wish to have your labs drawn at another location, please call the office 24 hours in advance to send orders.  If you have any questions regarding directions or hours of operation,  please call [phone].   Just as a reminder please drink plenty of water prior to coming for your lab work. Thanks!  Vaccines You are taking a medication(s) that can suppress your immune system.  The following immunizations are recommended: . Flu annually . Pneumonia (Pneumovax 23 and Prevnar 13 spaced at least 1 year apart) . Shingrix  Please check with your PCP to make sure you are up to date.  Abatacept solution for injection (subcutaneous or intravenous use) What is this medicine? ABATACEPT (a ba TA sept) is used to treat moderate to severe active rheumatoid arthritis or psoriatic arthritis in adults. This medicine is also used to treat juvenile idiopathic arthritis. This medicine may be used for other purposes; ask your health care provider or pharmacist if you have questions. COMMON BRAND NAME(S): Orencia What should I tell my health care provider before I take this medicine? They need to know if you have any of these conditions:  cancer  diabetes  hepatitis B or history of hepatitis B infection  immune system problems  infection or history of infection (especially a virus infection  such as chickenpox, cold sores, or herpes)  lung or breathing problems, like chronic obstructive pulmonary disease (COPD)  recently received or scheduled to receive a vaccination  scheduled to have surgery  tuberculosis, a positive skin test for tuberculosis, or have recently been in close contact with someone who has tuberculosis  an unusual or allergic reaction to abatacept, other medicines, foods, dyes, or preservatives  pregnant or trying to get pregnant  breast-feeding How should I use this medicine? This medicine is for infusion into a vein or for injection under the skin. Infusions are given by a health care professional in a hospital or clinic setting. If you are to give your own medicine at home, you will be taught how to prepare and give this medicine under the skin. Use exactly as directed. Take your medicine at regular intervals. Do not take your medicine more often than directed. It is important that you put your used needles and syringes in a special sharps container. Do not put them in a trash can. If you do not have a sharps container, call your pharmacist or health care provider to get one. Talk to your pediatrician regarding the use of this medicine in children. While infusions in a clinic may be prescribed for children as young as 2 years for selected conditions, precautions do apply. Overdosage: If you think you have taken too much of this medicine contact a poison control center or emergency room at once. NOTE: This medicine is only for you. Do not share this medicine  with others. What if I miss a dose? This medicine is used once a week if given by injection under the skin. If you miss a dose, take it as soon as you can. If it is almost time for your next dose, take only that dose. Do not take double or extra doses. If you are to be given an infusion of this medicine, it is important not to miss your dose. Doses are usually every 4 weeks. Call your doctor or health care  professional if you are unable to keep an appointment. What may interact with this medicine? Do not take this medicine with any of the following medications:  live vaccines This medicine may also interact with the following medications:  anakinra  baricitinib  canakinumab  medicines that lower your chance of fighting an infection  rituximab  TNF blockers such as adalimumab, certolizumab, etanercept, golimumab, infliximab  tocilizumab  tofacitinib  upadacitinib  ustekinumab This list may not describe all possible interactions. Give your health care provider a list of all the medicines, herbs, non-prescription drugs, or dietary supplements you use. Also tell them if you smoke, drink alcohol, or use illegal drugs. Some items may interact with your medicine. What should I watch for while using this medicine? Visit your doctor for regular checks on your progress. Tell your doctor or health care professional if your symptoms do not start to get better or if they get worse. You will be tested for tuberculosis (TB) before you start this medicine. If your doctor prescribed any medicine for TB, you should start taking the TB medicine before starting this medicine. Make sure to finish the full course of TB medicine. This medicine may increase your risk of getting an infection. Call your doctor or health care professional if you get fever, chills, or sore throat, or other symptoms of a cold or flu. Do not treat yourself. Try to avoid being around people who are sick. If you have diabetes and are getting this medicine in a vein, the infusion can give false high blood sugar readings on the day of your dose. This may happen if you use certain types of blood glucose tests. Your health care provider may tell you to use a different way to monitor your blood sugar levels. What side effects may I notice from receiving this medicine? Side effects that you should report to your doctor or health care  professional as soon as possible:  allergic reactions like skin rash, itching or hives, swelling of the face, lips, or tongue  breathing problems  chest pain  dizziness  signs and symptoms of infection like fever; chills; cough; sore throat; pain or trouble passing urine  unusually weak or tired Side effects that usually do not require medical attention (report to your doctor or health care professional if they continue or are bothersome):  diarrhea  headache  nausea  pain, redness, or irritation at site where injected  stomach pain or upset This list may not describe all possible side effects. Call your doctor for medical advice about side effects. You may report side effects to FDA at 1-800-FDA-1088. Where should I keep my medicine? Infusions will be given in a hospital or clinic and will not be stored at home. Storage for syringes and autoinjectors stored at home: Keep out of the reach of children. Store in a refrigerator between 2 and 8 degrees C (36 and 46 degrees F). Keep this medicine in the original container. Protect from light. Do not freeze. Do not  shake. Throw away any unused medicine after the expiration date. NOTE: This sheet is a summary. It may not cover all possible information. If you have questions about this medicine, talk to your doctor, pharmacist, or health care provider.  2020 Elsevier/Gold Standard (2018-09-08 14:01:21)

## 2019-04-19 NOTE — Telephone Encounter (Signed)
Received fax from [facility], patient's application has been APPROVED. Coverage dates are 04/19/19 to 03/17/20.   Will send document to scan center.   Phone# [phone] Fax# [phone]

## 2019-04-19 NOTE — Telephone Encounter (Signed)
All baseline labs have resulted.  Can proceed with scheduling new start appointment.   Verlin Fester, PharmD, [place], CPP Clinical Specialty Pharmacist [phone]  04/19/2019 2:23 PM

## 2019-04-20 NOTE — Telephone Encounter (Signed)
Spoke with patient and she will start her Orencia at her appointment on 05/05/19.

## 2019-04-29 NOTE — Progress Notes (Signed)
Office Visit Note  Patient: [name]             Date of Birth: 1960-08-05           MRN: [id number]             PCP: Kerri Perches, MD Referring: Kerri Perches, MD Visit Date: 05/05/2019 Occupation: @GUAROCC @  Subjective:  Medication Management (patient discontinued MTX after last visit. )   History of Present Illness: [name] is a 59 y.o. female with a past medical history of seronegative rheumatoid arthritis, osteoarthritis, and degenerative disc disease. She states that she stopped her Methotrexate after her last visit about 3-4 weeks ago and has not taken since then. She received her first injection of Orencia today. She endorses continued weakness in bilateral hands that waxes and wanes. This weakness often causes her to drop heavy objects. She also states that her hands are sore all the time as she experiences persistent pain and inflammation in her bilateral hands. She denies any wrist, elbow, or shoulder pain. Patient also denies back and feet pain at this time. She continues to experience chronic pain in her bilateral knee pain replacements and complains of occasional bilateral ankle pain. She experiences swelling in bilateral ankles, with her right ankle being worse than her left.   Activities of Daily Living:  Patient reports morning stiffness for several hours.   Patient Denies nocturnal pain.  Difficulty dressing/grooming: Denies Difficulty climbing stairs: Reports Difficulty getting out of chair: Denies Difficulty using hands for taps, buttons, cutlery, and/or writing: Reports  Review of Systems  Constitutional: Positive for fatigue. Negative for night sweats, weight gain and weight loss.  HENT: Negative for mouth sores, trouble swallowing, trouble swallowing, mouth dryness and nose dryness.   Eyes: Negative for pain, redness, itching, visual disturbance and dryness.  Respiratory: Negative for cough, shortness of breath and difficulty  breathing.   Cardiovascular: Negative for chest pain, palpitations, hypertension, irregular heartbeat and swelling in legs/feet.  Gastrointestinal: Negative for blood in stool, constipation and diarrhea.  Endocrine: Negative for increased urination.  Genitourinary: Negative for difficulty urinating, painful urination and vaginal dryness.  Musculoskeletal: Positive for arthralgias, joint pain, joint swelling and morning stiffness. Negative for myalgias, muscle weakness, muscle tenderness and myalgias.  Skin: Negative for color change, rash, hair loss, skin tightness, ulcers and sensitivity to sunlight.  Allergic/Immunologic: Negative for susceptible to infections.  Neurological: Positive for weakness. Negative for dizziness, numbness, headaches, memory loss and night sweats.  Hematological: Negative for bruising/bleeding tendency and swollen glands.  Psychiatric/Behavioral: Negative for depressed mood, confusion and sleep disturbance. The patient is not nervous/anxious.     PMFS History:  Patient Active Problem List   Diagnosis Date Noted  . High risk medication use 01/07/2019  . Generalized joint pain 10/20/2018  . Nausea 10/20/2018  . Recurrent major depressive disorder, in full remission (HCC) 04/10/2018  . Allergic dermatitis 02/02/2018  . Major depressive disorder, recurrent episode, moderate (HCC) 08/29/2017  . Back pain with sciatica 11/13/2015  . Prediabetes 07/31/2015  . Knee osteoarthritis 07/13/2013  . Chronic diastolic heart failure (HCC) 01/06/2013  . Snoring disorder 01/03/2013  . Thoracic spine pain 12/29/2012  . Vitamin D deficiency 12/08/2012  . Exercise intolerance 11/26/2011  . Essential hypertension 11/26/2011  . Migraine headache   . 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 Anemia 07/03/2011  . Morbid obesity (HCC) 12/03/2007    Past Medical History:  Diagnosis Date  . Chronic back pain   .  Degenerative joint disease    Left TKA in 07/2009-Dr. Landau  .  Depression 2001   doesn't take any meds  . Headache(784.0)    occasionally  . Hypothyroidism    was on Levothyroxine-has been off x 4 months  . Joint pain   . Joint swelling   . Morbid obesity (HCC)   . Nocturia   . Osteoarthritis of right knee 07/13/2013  . Peripheral edema    to right leg;takes Furosemide occasionally and hasnt had one in a month  . Syncope    with palpitations; evaluated by [facility] in 2008 with normal echo    Family History  Problem Relation Age of Onset  . Diabetes Mother   . Depression Mother   . Diabetes Father        emphsema   . Hyperlipidemia Father   . Emphysema Father   . Rheum arthritis Sister   . Healthy Daughter   . Healthy Daughter   . Healthy Daughter   . Colon cancer Neg Hx    Past Surgical History:  Procedure Laterality Date  . ABDOMINAL HYSTERECTOMY  1995    no oophorectomy  . CARPAL TUNNEL RELEASE  1991   Bilateral  . CESAREAN SECTION     X2  . CHOLECYSTECTOMY    . CHOLECYSTECTOMY, LAPAROSCOPIC  2007   Dr. Lovell Sheehan  . COLONOSCOPY N/A 03/03/2013   Procedure: COLONOSCOPY;  Surgeon: Malissa Hippo, MD;  Location: [facility];  Service: Endoscopy;  Laterality: N/A;  930  . CRANIECTOMY SUBOCCIPITAL W/ CERVICAL LAMINECTOMY / CHIARI  2007   Repair of Arnold-Chiari malformation  . EYE SURGERY     bilateral cataract surgery  . JOINT REPLACEMENT Left 2011  . KNEE ARTHROSCOPY  1995   Dr. Cleophas Dunker   . THYROIDECTOMY, PARTIAL  2004   Left lobectomy - large benign nodule  . TONSILLECTOMY    . TOTAL KNEE ARTHROPLASTY  07/2009   Left knee , Dr. Dion Saucier   . TOTAL KNEE ARTHROPLASTY Right 07/13/2013   Procedure: TOTAL KNEE ARTHROPLASTY;  Surgeon: Eulas Post, MD;  Location: [facility] OR;  Service: Orthopedics;  Laterality: Right;   Social History   Social History Narrative   Is in a new relationship    Immunization History  Administered Date(s) Administered  . Influenza Split 01/16/2012  . Influenza Whole  02/23/2007, 12/07/2008  . Influenza,inj,Quad PF,6+ Mos 12/07/2012, 02/15/2014, 11/28/2014, 01/09/2016, 04/07/2017, 12/18/2017, 02/16/2019  . Pneumococcal Polysaccharide-23 07/31/2015  . Td 10/23/2005     Objective: Vital Signs: BP 128/81 (BP Location: Left Arm, Patient Position: Sitting, Cuff Size: Large)   Pulse (!) 54   Resp 16   Ht 5\' 2"  (1.575 m)   Wt (!) 321 lb (145.6 kg)   BMI 58.71 kg/m    Physical Exam Vitals and nursing note reviewed.  Constitutional:      Appearance: She is well-developed.  HENT:     Head: Normocephalic and atraumatic.  Eyes:     Conjunctiva/sclera: Conjunctivae normal.  Cardiovascular:     Rate and Rhythm: Normal rate and regular rhythm.     Heart sounds: Normal heart sounds.  Pulmonary:     Effort: Pulmonary effort is normal.     Breath sounds: Normal breath sounds.  Abdominal:     General: Bowel sounds are normal.     Palpations: Abdomen is soft.  Musculoskeletal:     Cervical back: Normal range of motion.  Lymphadenopathy:     Cervical: No cervical adenopathy.  Skin:    General: Skin is warm and dry.     Capillary Refill: Capillary refill takes less than 2 seconds.  Neurological:     Mental Status: She is alert and oriented to person, place, and time.  Psychiatric:        Behavior: Behavior normal.      Musculoskeletal Exam: C-spine was in range of motion.  She has good range of motion of her shoulder joints and elbow joints.  She synovitis and tenosynovitis of the wrist joints and MCP joints as described below.  PIP tenderness was noted.  Knee joints with good range of motion.  No ankle joint synovitis was noted.  CDAI Exam: CDAI Score: 27.4  Patient Global: 7 mm; Provider Global: 7 mm Swollen: 6 ; Tender: 20  Joint Exam 05/05/2019      Right  Left  Wrist  Swollen Tender     MCP 1   Tender   Tender  MCP 2  Swollen Tender  Swollen Tender  MCP 3  Swollen Tender  Swollen Tender  MCP 4      Tender  MCP 5  Swollen Tender   Tender    IP   Tender   Tender  PIP 2   Tender   Tender  PIP 3   Tender   Tender  PIP 4   Tender   Tender  PIP 5   Tender   Tender     Investigation: No additional findings.  Imaging: No results found.  Recent Labs: Lab Results  Component Value Date   WBC 8.5 01/27/2019   HGB 11.3 (L) 01/27/2019   PLT 429 (H) 01/27/2019   NA 140 01/27/2019   K 4.0 01/27/2019   CL 102 01/27/2019   CO2 29 01/27/2019   GLUCOSE 90 01/27/2019   BUN 18 01/27/2019   CREATININE 0.88 01/27/2019   BILITOT 0.3 01/27/2019   ALKPHOS 81 08/14/2017   AST 12 01/27/2019   ALT 9 01/27/2019   PROT 7.2 01/27/2019   ALBUMIN 4.0 08/14/2017   CALCIUM 9.1 01/27/2019   GFRAA 85 01/27/2019   QFTBGOLDPLUS NEGATIVE 11/30/2018    Speciality Comments: No specialty comments available.  Procedures:  No procedures performed Allergies: Aleve [naproxen sodium], B12 folate [cobalamin combinations], Penicillins, Adhesive [tape], Phentermine, and Latex   Assessment / Plan:     Visit Diagnoses: Rheumatoid arthritis of multiple sites with negative rheumatoid factor (HCC) - RF negative, anti-CCP negative, patient has severe synovitis and radiographic changes with MCP narrowing and juxta-articular osteopenia -patient misunderstood at the last visit and discontinued methotrexate.  We had detailed discussion regarding restarting methotrexate.  Prescription refill for methotrexate was given.  She was given her first dose of abatacept today.  Indications side effects contraindications were reviewed.  She was observed in the office for 30 minutes and had no side effects.  Plan: Abatacept SOAJ 125 mg  High risk medication use - Methotrexate 0.8 ml every 7 days (started in September 6222) and folic acid 1 mg 2 tablets daily.  Abatacept was added today.  Status post total bilateral knee replacement-chronic pain.  Primary osteoarthritis of both feet-she is currently not having much discomfort.  DDD (degenerative disc disease),  cervical-she has chronic discomfort but had fairly good range of motion.  Vitamin D deficiency-she is on vitamin D supplement.  Family history of rheumatoid arthritis  Chronic diastolic heart failure (HCC)  History of gastroesophageal reflux (GERD)  Essential hypertension  Prediabetes  Hx of migraines  History of  depression  S/P partial thyroidectomy  Orders: No orders of the defined types were placed in this encounter.  Meds ordered this encounter  Medications  . Abatacept SOAJ 125 mg     Follow-Up Instructions: Return in about 4 weeks (around 06/02/2019) for Rheumatoid arthritis.   [name], MD  Note - This record has been created using Animal nutritionist.  Chart creation errors have been sought, but may not always  have been located. Such creation errors do not reflect on  the standard of medical care.

## 2019-05-05 ENCOUNTER — Ambulatory Visit: Payer: Medicare HMO | Admitting: Rheumatology

## 2019-05-05 ENCOUNTER — Encounter: Payer: Self-pay | Admitting: Rheumatology

## 2019-05-05 ENCOUNTER — Other Ambulatory Visit: Payer: Self-pay

## 2019-05-05 VITALS — BP 120/93 | HR 53 | Resp 16 | Ht 62.0 in | Wt 321.0 lb

## 2019-05-05 DIAGNOSIS — R7303 Prediabetes: Secondary | ICD-10-CM

## 2019-05-05 DIAGNOSIS — Z8659 Personal history of other mental and behavioral disorders: Secondary | ICD-10-CM

## 2019-05-05 DIAGNOSIS — Z79899 Other long term (current) drug therapy: Secondary | ICD-10-CM

## 2019-05-05 DIAGNOSIS — I10 Essential (primary) hypertension: Secondary | ICD-10-CM

## 2019-05-05 DIAGNOSIS — I5032 Chronic diastolic (congestive) heart failure: Secondary | ICD-10-CM | POA: Diagnosis not present

## 2019-05-05 DIAGNOSIS — M19071 Primary osteoarthritis, right ankle and foot: Secondary | ICD-10-CM | POA: Diagnosis not present

## 2019-05-05 DIAGNOSIS — Z8261 Family history of arthritis: Secondary | ICD-10-CM | POA: Diagnosis not present

## 2019-05-05 DIAGNOSIS — Z96653 Presence of artificial knee joint, bilateral: Secondary | ICD-10-CM | POA: Diagnosis not present

## 2019-05-05 DIAGNOSIS — E890 Postprocedural hypothyroidism: Secondary | ICD-10-CM

## 2019-05-05 DIAGNOSIS — Z8669 Personal history of other diseases of the nervous system and sense organs: Secondary | ICD-10-CM

## 2019-05-05 DIAGNOSIS — E559 Vitamin D deficiency, unspecified: Secondary | ICD-10-CM

## 2019-05-05 DIAGNOSIS — Z8719 Personal history of other diseases of the digestive system: Secondary | ICD-10-CM | POA: Diagnosis not present

## 2019-05-05 DIAGNOSIS — Z9889 Other specified postprocedural states: Secondary | ICD-10-CM

## 2019-05-05 DIAGNOSIS — M5030 Other cervical disc degeneration, unspecified cervical region: Secondary | ICD-10-CM

## 2019-05-05 DIAGNOSIS — M19072 Primary osteoarthritis, left ankle and foot: Secondary | ICD-10-CM

## 2019-05-05 DIAGNOSIS — M0609 Rheumatoid arthritis without rheumatoid factor, multiple sites: Secondary | ICD-10-CM

## 2019-05-05 MED ORDER — ABATACEPT 125 MG/ML ~~LOC~~ SOAJ
125.0000 mg | Freq: Once | SUBCUTANEOUS | Status: AC
  Administered 2019-05-05: 125 mg via SUBCUTANEOUS

## 2019-05-05 MED ORDER — "TUBERCULIN SYRINGE 27G X 1/2"" 1 ML MISC": 12.0000 | 3 refills | Status: DC

## 2019-05-05 MED ORDER — METHOTREXATE SODIUM CHEMO INJECTION (PF) 50 MG/2ML: INTRAMUSCULAR | 0 refills | Status: DC

## 2019-05-05 NOTE — Patient Instructions (Signed)
Standing Labs We placed an order today for your standing lab work.    Please come back and get your standing labs in 1 month and then every 3 months  We have open lab daily Monday through Thursday from 8:30-12:30 PM and 1:30-4:30 PM and Friday from 8:30-12:30 PM and 1:30-4:00 PM at the office of Dr. Atlanta Pelto.   You may experience shorter wait times on Monday and Friday afternoons. The office is located at [address], [address], [place], [place] [address] No appointment is necessary.   Labs are drawn by [facility].  You may receive a bill from [facility] for your lab work.  If you wish to have your labs drawn at another location, please call the office 24 hours in advance to send orders.  If you have any questions regarding directions or hours of operation,  please call [phone].   Just as a reminder please drink plenty of water prior to coming for your lab work. Thanks!  

## 2019-05-05 NOTE — Progress Notes (Signed)
Patient is a new start to [place]. Patient was given a demonstration for the proper technique to self administer the Orencia pen. Patient was able to demonstrate the proper technique shown. Patient was given her injection in her left lower abdomen. Patient monitored in the office for 30 minutes for adverse reactions. Patient had a small amount of redness at injection site. Patient has no complaints of discomfort or itching. Patient advised to notify the office if the area of redness increases. Patient advised she may use hydrocortisone cream on the area as well as take Claritin.   Administrations This Visit    Abatacept SOAJ 125 mg    Admin Date 05/05/2019 Action Given Dose 125 mg Route Subcutaneous Administered By Henriette Combs, LPN

## 2019-05-20 NOTE — Progress Notes (Signed)
Virtual Visit via Video Note  I connected with [name] on 05/31/19 at  9:00 AM EDT by a video enabled telemedicine application and verified that I am speaking with the correct person using two identifiers.   I discussed the limitations of evaluation and management by telemedicine and the availability of in person appointments. The patient expressed understanding and agreed to proceed.     I discussed the assessment and treatment plan with the patient. The patient was provided an opportunity to ask questions and all were answered. The patient agreed with the plan and demonstrated an understanding of the instructions.   The patient was advised to call back or seek an in-person evaluation if the symptoms worsen or if the condition fails to improve as anticipated.  I provided 10 minutes of non-face-to-face time during this encounter.   Neysa Hotter, MD    [facility] MD/PA/NP OP Progress Note  05/31/2019 9:26 AM [name]  MRN:  [id number]  Chief Complaint:  Chief Complaint    Depression; Follow-up     HPI:  This is a follow-up appointment for depression.  She states that she has been doing very well.  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 She has been handling things well.  Although she was a little sad around the holiday season as she misses her parents ( her mother deceased in Jan 24, 2019and her father in 2017), it was less compared to the previous year. She had a good holiday with her girls. Her older granddaughter comes to her place a few times per week as their school is in the area.  Although she feels anxious at times (she does not recollect trigger), she was able to handle it better.  She sleeps well with melatonin.  She has good energy and motivation.  She has good appetite.  She denies difficulty in concentration.  She denies SI.  She denies panic attacks.  She has joined a fitness, and has lost some weight.   Wt Readings from Last 3 Encounters:  05/05/19  (!) 321 lb (145.6 kg)  04/08/19 (!) 331 lb (150.1 kg)  02/09/19 (!) 318 lb (144.2 kg)     Visit Diagnosis:    ICD-10-CM   1. MDD (major depressive disorder), recurrent, in full remission (HCC)  F33.42     Past Psychiatric History: Please see initial evaluation for full details. I have reviewed the history. No updates at this time.     Past Medical History:  Past Medical History:  Diagnosis Date  . Chronic back pain   . Degenerative joint disease    Left TKA in 07/2009-Dr. Landau  . Depression 2001   doesn't take any meds  . Headache(784.0)    occasionally  . Hypothyroidism    was on Levothyroxine-has been off x 4 months  . Joint pain   . Joint swelling   . Morbid obesity (HCC)   . Nocturia   . Osteoarthritis of right knee 07/13/2013  . Peripheral edema    to right leg;takes Furosemide occasionally and hasnt had one in a month  . Syncope    with palpitations; evaluated by [facility] in 2008 with normal echo    Past Surgical History:  Procedure Laterality Date  . ABDOMINAL HYSTERECTOMY  1995    no oophorectomy  . CARPAL TUNNEL RELEASE  1991   Bilateral  . CESAREAN SECTION     X2  . CHOLECYSTECTOMY    . CHOLECYSTECTOMY, LAPAROSCOPIC  2007  Dr. Arnoldo Morale  . COLONOSCOPY N/A 03/03/2013   Procedure: COLONOSCOPY;  Surgeon: Rogene Houston, MD;  [facility];  Service: Endoscopy;  Laterality: N/A;  930  . CRANIECTOMY SUBOCCIPITAL W/ CERVICAL LAMINECTOMY / CHIARI  2007   Repair of Arnold-Chiari malformation  . EYE SURGERY     bilateral cataract surgery  . JOINT REPLACEMENT Left 2011  . KNEE ARTHROSCOPY  1995   Dr. Durward Fortes   . THYROIDECTOMY, PARTIAL  2004   Left lobectomy - large benign nodule  . TONSILLECTOMY    . TOTAL KNEE ARTHROPLASTY  07/2009   Left knee , Dr. Mardelle Matte   . TOTAL KNEE ARTHROPLASTY Right 07/13/2013   Procedure: TOTAL KNEE ARTHROPLASTY;  Surgeon: Johnny Bridge, MD;  Location: [place] [facility];  Service: Orthopedics;  Laterality:  Right;    Family Psychiatric History: Please see initial evaluation for full details. I have reviewed the history. No updates at this time.     Family History:  Family History  Problem Relation Age of Onset  . Diabetes Mother   . Depression Mother   . Diabetes Father        emphsema   . Hyperlipidemia Father   . Emphysema Father   . Rheum arthritis Sister   . Healthy Daughter   . Healthy Daughter   . Healthy Daughter   . Colon cancer Neg Hx     Social History:  Social History   Socioeconomic History  . Marital status: Significant Other    Spouse name: Not on file  . Number of children: 3  . Years of education: 58  . Highest education level: 12th grade  Occupational History  . Occupation: Employed seeking disabilty 2012  Tobacco Use  . Smoking status: Never Smoker  . Smokeless tobacco: Never Used  Substance and Sexual Activity  . Alcohol use: Yes    Alcohol/week: 0.0 standard drinks    Comment: rarely  . Drug use: No  . Sexual activity: Yes    Birth control/protection: Surgical  Other Topics Concern  . Not on file  Social History Narrative   Is in a new relationship    Social Determinants of Health   Financial Resource Strain:   . Difficulty of Paying Living Expenses:   Food Insecurity:   . Worried About Charity fundraiser in the Last Year:   . Arboriculturist in the Last Year:   Transportation Needs:   . Film/video editor (Medical):   Marland Kitchen Lack of Transportation (Non-Medical):   Physical Activity:   . Days of Exercise per Week:   . Minutes of Exercise per Session:   Stress:   . Feeling of Stress :   Social Connections:   . Frequency of Communication with Friends and Family:   . Frequency of Social Gatherings with Friends and Family:   . Attends Religious Services:   . Active Member of Clubs or Organizations:   . Attends Archivist Meetings:   Marland Kitchen Marital Status:     Allergies:  Allergies  Allergen Reactions  . Aleve [Naproxen  Sodium] Shortness Of Breath  . B12 Folate [Cobalamin Combinations] Dermatitis  . Penicillins Swelling    .Has patient had a PCN reaction causing immediate rash, facial/tongue/throat swelling, SOB or lightheadedness with hypotension: Yes Has patient had a PCN reaction causing severe rash involving mucus membranes or skin necrosis: No Has patient had a PCN reaction that required hospitalization: No Has patient had a PCN reaction occurring within the  last 10 years: No If all of the above answers are "NO", then may proceed with Cephalosporin use.   . Adhesive [Tape]     rash  . Phentermine   . Latex Rash    Metabolic Disorder Labs: Lab Results  Component Value Date   HGBA1C 6.2 (H) 09/09/2018   MPG 131 09/09/2018   MPG 128 04/14/2018   No results found for: PROLACTIN Lab Results  Component Value Date   CHOL 147 04/14/2018   TRIG 94 04/14/2018   HDL 44 (L) 04/14/2018   CHOLHDL 3.3 04/14/2018   VLDL 12 10/02/2016   LDLCALC 84 04/14/2018   LDLCALC 60 04/11/2017   Lab Results  Component Value Date   TSH 3.67 04/14/2018   TSH 2.34 04/11/2017    Therapeutic Level Labs: No results found for: LITHIUM No results found for: VALPROATE No components found for:  CBMZ  Current Medications: Current Outpatient Medications  Medication Sig Dispense Refill  . acyclovir (ZOVIRAX) 400 MG tablet TAKE 1 TABLET BY MOUTH THREE TIMES A DAY 30 tablet 3  . B Complex Vitamins (VITAMIN B COMPLEX PO) Take by mouth daily.    . folic acid (FOLVITE) 1 MG tablet Take 2 tablets (2 mg total) by mouth daily. 180 tablet 3  . hydrochlorothiazide (HYDRODIURIL) 25 MG tablet Take 1 tablet (25 mg total) by mouth daily. 90 tablet 3  . ibuprofen (ADVIL) 800 MG tablet TAKE ONE TABLET BY MOUTH TWICE WEEKLY AS NEEDED, FOR KNEE OR BACK PAIN 30 tablet 0  . metFORMIN (GLUCOPHAGE) 500 MG tablet Take 1 tablet (500 mg total) by mouth daily. 90 tablet 3  . Methotrexate Sodium (METHOTREXATE, PF,) 50 MG/2ML injection Inject  0.8 mL under the skin weekly. 10 mL 0  . ondansetron (ZOFRAN) 4 MG tablet Take 1 tablet (4 mg total) by mouth every 8 (eight) hours as needed for nausea or vomiting. 20 tablet 0  . phentermine (ADIPEX-P) 37.5 MG tablet Take 1 tablet (37.5 mg total) by mouth daily before breakfast. 30 tablet 3  . [START ON 07/17/2019] sertraline (ZOLOFT) 100 MG tablet Take 1.5 tablets (150 mg total) by mouth daily. 135 tablet 1  . TUBERCULIN SYR 1CC/27GX1/2" (B-D TB SYRINGE 1CC/27GX1/2") 27G X 1/2" 1 ML MISC 12 Syringes by Does not apply route once a week. 12 each 3   No current facility-administered medications for this visit.     Musculoskeletal: Strength & Muscle Tone: N/A Gait & Station: N/A Patient leans: N/A  Psychiatric Specialty Exam: Review of Systems  Psychiatric/Behavioral: Negative for agitation, behavioral problems, confusion, decreased concentration, dysphoric mood, hallucinations, self-injury, sleep disturbance and suicidal ideas. The patient is nervous/anxious. The patient is not hyperactive.   All other systems reviewed and are negative.   There were no vitals taken for this visit.There is no height or weight on file to calculate BMI.  General Appearance: Fairly Groomed  Eye Contact:  Good  Speech:  Clear and Coherent  Volume:  Normal  Mood:  "good:  Affect:  Appropriate, Congruent, Full Range and euthymic  Thought Process:  Coherent  Orientation:  Full (Time, Place, and Person)  Thought Content: Logical   Suicidal Thoughts:  No  Homicidal Thoughts:  No  Memory:  Immediate;   Good  Judgement:  Good  Insight:  Good  Psychomotor Activity:  Normal  Concentration:  Concentration: Good and Attention Span: Good  Recall:  Good  Fund of Knowledge: Good  Language: Good  Akathisia:  No  Handed:  Right  AIMS (if indicated): not done  Assets:  Communication Skills Desire for Improvement  ADL's:  Intact  Cognition: WNL  Sleep:  Good   Screenings: AIMS     [facility]  AIMS Total Score  0    AUDIT     [facility]  Alcohol Use Disorder Identification Test Final Score (AUDIT)  0    GAD-7     Virtual BH Phone Follow Up from 08/26/2017 in [facility] Virtual BH Visit from 08/14/2017 in [facility]  Total GAD-7 Score  7  14    PHQ2-9     Office Visit from 10/20/2018 in [facility] Office Visit from 09/09/2018 in [facility] Office Visit from 04/22/2018 in [facility] Clinical Support from 01/01/2018 in [facility] Office Visit from 12/18/2017 in [facility]  PHQ-2 Total Score  0  0  0  1  1  PHQ-9 Total Score  --  --  2  --  --       Assessment and Plan:  [name] is a 59 y.o. year old female with a history of depression, hypertension, obesity, who presents for follow up appointment for MDD (major depressive disorder), recurrent, in full remission (HCC)  # MDD, recurrent in full remission She denies significant depressive symptoms since her last visit except around the holiday season which reminds her of her deceased parents.  She is agreeable to continue current dose of sertraline as maintenance therapy given she had at least a few times of depressive episode in the past.  Will discontinue hydroxyzine given she has not taken this medication.  Discussed behavioral activation.   Plan I have reviewed and updated plans as below 1. Continue sertraline 150 mg daily  2.Discontinue hydroxyzine 3.Next appointment: 9/6 at 9:10 for 20 mins, video - On Orencia (abatacept) for RA   The patient demonstrates the following risk factors for suicide: Chronic risk factors for suicide include:psychiatric disorder ofdepressionand completed suicide in a family member. Acute risk factorsfor suicide include: loss (financial, interpersonal,  professional) and recent discharge from inpatient psychiatry. Protective factorsfor this patient include: positive social support, responsibility to others (children, family), coping skills and hope for the future. Considering these factors, the overall suicide risk at this point appears to below. Patientisappropriate for outpatient follow up.  Neysa Hotter, MD 05/31/2019, 9:26 AM

## 2019-05-27 NOTE — Progress Notes (Addendum)
Office Visit Note  Patient: [name]             Date of Birth: 19-Jun-1960           MRN: [id number]             PCP: Kerri Perches, MD Referring: Kerri Perches, MD Visit Date: 06/02/2019 Occupation: @GUAROCC @  Subjective:  Pain in both hands  History of Present Illness: [name] is a 59 y.o. female with history of seronegative rheumatoid arthritis and osteoarthritis.  Patient is on Orencia 125 mg subcutaneous injections once weekly, methotrexate 0.8 mL once weekly, folic acid 2 mg by mouth daily. She has not missed any doses recently.  She denies any recent infections. She continues to have intermittent pain, swelling, and stiffness in both hands and both wrist joints.  She has been using Voltaren gel topically as needed for pain relief.  She has noticed very mild improvement since adding on methotrexate and September 2020.  She denies any other joint pain or joint swelling at this time.   Activities of Daily Living:  Patient reports morning stiffness for 1 hour.   Patient Denies nocturnal pain.  Difficulty dressing/grooming: Denies Difficulty climbing stairs: Reports Difficulty getting out of chair: Denies Difficulty using hands for taps, buttons, cutlery, and/or writing: Reports  Review of Systems  Constitutional: Negative for fatigue.  HENT: Negative for mouth sores, mouth dryness and nose dryness.   Eyes: Negative for itching and dryness.  Respiratory: Negative for shortness of breath, wheezing and difficulty breathing.   Cardiovascular: Negative for chest pain and palpitations.  Gastrointestinal: Negative for abdominal pain, blood in stool, constipation and diarrhea.  Endocrine: Negative for increased urination.  Genitourinary: Positive for nocturia. Negative for difficulty urinating.  Musculoskeletal: Positive for arthralgias, joint pain, joint swelling and morning stiffness.  Skin: Negative for rash and hair loss.  Allergic/Immunologic:  Negative for susceptible to infections.  Neurological: Positive for headaches and weakness. Negative for dizziness, numbness and memory loss.  Hematological: Negative for bruising/bleeding tendency.  Psychiatric/Behavioral: Positive for confusion.    PMFS History:  Patient Active Problem List   Diagnosis Date Noted  . High risk medication use 01/07/2019  . Generalized joint pain 10/20/2018  . Nausea 10/20/2018  . Recurrent major depressive disorder, in full remission (HCC) 04/10/2018  . Allergic dermatitis 02/02/2018  . Major depressive disorder, recurrent episode, moderate (HCC) 08/29/2017  . Back pain with sciatica 11/13/2015  . Prediabetes 07/31/2015  . Knee osteoarthritis 07/13/2013  . Chronic diastolic heart failure (HCC) 01/06/2013  . Snoring disorder 01/03/2013  . Thoracic spine pain 12/29/2012  . Vitamin D deficiency 12/08/2012  . Exercise intolerance 11/26/2011  . Essential hypertension 11/26/2011  . Migraine headache   . 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 Anemia 07/03/2011  . Morbid obesity (HCC) 12/03/2007    Past Medical History:  Diagnosis Date  . Chronic back pain   . Degenerative joint disease    Left TKA in 07/2009-Dr. Landau  . Depression 2001   doesn't take any meds  . Headache(784.0)    occasionally  . Hypothyroidism    was on Levothyroxine-has been off x 4 months  . Joint pain   . Joint swelling   . Morbid obesity (HCC)   . Nocturia   . Osteoarthritis of right knee 07/13/2013  . Peripheral edema    to right leg;takes Furosemide occasionally and hasnt had one in a month  . Syncope    with palpitations; evaluated  by [facility] in 2008 with normal echo    Family History  Problem Relation Age of Onset  . Diabetes Mother   . Depression Mother   . Diabetes Father        emphsema   . Hyperlipidemia Father   . Emphysema Father   . Rheum arthritis Sister   . Healthy Daughter   . Healthy Daughter   . Healthy Daughter   .  Colon cancer Neg Hx    Past Surgical History:  Procedure Laterality Date  . ABDOMINAL HYSTERECTOMY  1995    no oophorectomy  . CARPAL TUNNEL RELEASE  1991   Bilateral  . CESAREAN SECTION     X2  . CHOLECYSTECTOMY    . CHOLECYSTECTOMY, LAPAROSCOPIC  2007   Dr. Arnoldo Morale  . COLONOSCOPY N/A 03/03/2013   Procedure: COLONOSCOPY;  Surgeon: Rogene Houston, MD;  [facility];  Service: Endoscopy;  Laterality: N/A;  930  . CRANIECTOMY SUBOCCIPITAL W/ CERVICAL LAMINECTOMY / CHIARI  2007   Repair of Arnold-Chiari malformation  . EYE SURGERY     bilateral cataract surgery  . JOINT REPLACEMENT Left 2011  . KNEE ARTHROSCOPY  1995   Dr. Durward Fortes   . THYROIDECTOMY, PARTIAL  2004   Left lobectomy - large benign nodule  . TONSILLECTOMY    . TOTAL KNEE ARTHROPLASTY  07/2009   Left knee , Dr. Mardelle Matte   . TOTAL KNEE ARTHROPLASTY Right 07/13/2013   Procedure: TOTAL KNEE ARTHROPLASTY;  Surgeon: Johnny Bridge, MD;  Location: [facility];  Service: Orthopedics;  Laterality: Right;   Social History   Social History Narrative   Is in a new relationship    Immunization History  Administered Date(s) Administered  . Influenza Split 01/16/2012  . Influenza Whole 02/23/2007, 12/07/2008  . Influenza,inj,Quad PF,6+ Mos 12/07/2012, 02/15/2014, 11/28/2014, 01/09/2016, 04/07/2017, 12/18/2017, 02/16/2019  . Pneumococcal Polysaccharide-23 07/31/2015  . Td 10/23/2005     Objective: Vital Signs: BP 134/81 (BP Location: Left Wrist, Patient Position: Sitting, Cuff Size: Normal)   Pulse (!) 56   Resp 15   Ht 5\' 2"  (1.575 m)   Wt (!) 327 lb (148.3 kg)   BMI 59.81 kg/m    Physical Exam Vitals and nursing note reviewed.  Constitutional:      Appearance: She is well-developed.  HENT:     Head: Normocephalic and atraumatic.  Eyes:     Conjunctiva/sclera: Conjunctivae normal.  Pulmonary:     Effort: Pulmonary effort is normal.  Abdominal:     General: Bowel sounds are normal.     Palpations:  Abdomen is soft.  Musculoskeletal:     Cervical back: Normal range of motion.  Lymphadenopathy:     Cervical: No cervical adenopathy.  Skin:    General: Skin is warm and dry.     Capillary Refill: Capillary refill takes less than 2 seconds.  Neurological:     Mental Status: She is alert and oriented to person, place, and time.  Psychiatric:        Behavior: Behavior normal.      Musculoskeletal Exam: C-spine, thoracic spine, and lumbar spine good ROM.  Shoulder joints and elbow joints have good range of motion with no tenderness or inflammation.  Extensor tenosynovitis of the right wrist noted.  Tenderness and synovitis of the right 3rd MCP joint.  Complete fist formation bilaterally.  Hip joints have good range of motion with no discomfort.  Bilateral knee replacements have good range of motion with  no discomfort at this time.  Ankle joints have good range of motion.  Pedal edema noted bilaterally.  CDAI Exam: CDAI Score: 5.2  Patient Global: 6 mm; Provider Global: 6 mm Swollen: 2 ; Tender: 2  Joint Exam 06/02/2019      Right  Left  Wrist  Swollen Tender     MCP 3  Swollen Tender        Investigation: No additional findings.  Imaging: No results found.  Recent Labs: Lab Results  Component Value Date   WBC 8.5 01/27/2019   HGB 11.3 (L) 01/27/2019   PLT 429 (H) 01/27/2019   NA 140 01/27/2019   K 4.0 01/27/2019   CL 102 01/27/2019   CO2 29 01/27/2019   GLUCOSE 90 01/27/2019   BUN 18 01/27/2019   CREATININE 0.88 01/27/2019   BILITOT 0.3 01/27/2019   ALKPHOS 81 08/14/2017   AST 12 01/27/2019   ALT 9 01/27/2019   PROT 7.2 01/27/2019   ALBUMIN 4.0 08/14/2017   CALCIUM 9.1 01/27/2019   GFRAA 85 01/27/2019   QFTBGOLDPLUS NEGATIVE 11/30/2018    Speciality Comments: No specialty comments available.  Procedures:  No procedures performed Allergies: Aleve [naproxen sodium], B12 folate [cobalamin combinations], Penicillins, Adhesive [tape], Phentermine, and Latex    Assessment / Plan:     Visit Diagnoses: Rheumatoid arthritis of multiple sites with negative rheumatoid factor (HCC) - RF negative, anti-CCP negative, severe synovitis and radiographic changes with MCP narrowing and juxta-articular osteopenia: She has extensor tenosynovitis of the right wrist and tenderness and synovitis of the right third MCP joint.  She continues to experience stiffness, intermittent swelling, and discomfort in both hands and both wrist joints.  She is not experiencing any other joint pain or joint swelling at this time.  She is on Orencia 125 mg subcu injections once weekly, methotrexate 0.8 mL once weekly, and folic acid 2 mg by mouth daily.  She has noticed very minimal improvement since adding on methotrexate in September 2020.  Different treatment options were discussed.  She is not a good candidate for anti-TNF due to her history of heart failure.  She will increase methotrexate to 1 mL subcu injection once weekly.  She does not want to make any other medication changes at this time.  She was advised to notify us if she continues to have recurrent flares. She will follow up in 3 months.   High risk medication use - Orencia 125 mg sq injections once weekly, Methotrexate 0.8 ml every 7 days (started in September 2020) and folic acid 1 mg 2 tablets daily.  CBC and CMP were drawn on 01/27/2019.  She is due to update CBC and CMP today.  She will return for lab work in June and every 3 months to monitor for drug toxicity.  TB gold negative on 11/30/2018.  She has not had any recent infections.  She is planning on receiving the COVID-19 vaccination once it is available to her.  Status post total bilateral knee replacement: Doing well.  She has good range of motion bilaterally.  No warmth or effusion was noted.  She has no discomfort at this time.  Primary osteoarthritis of both feet: She has no feet pain at this time.  Good ROM of both ankle joints.    DDD (degenerative disc disease),  cervical: She has good ROM with no discomfort.   Other medical conditions are listed as follows:  Vitamin D deficiency  Family history of rheumatoid arthritis  History of gastroesophageal reflux (  GERD)  Chronic diastolic heart failure (HCC)  Essential hypertension  Prediabetes  S/P partial thyroidectomy  History of depression  Hx of migraines  Orders: Orders Placed This Encounter  Procedures  . CBC with Differential/Platelet  . COMPLETE METABOLIC PANEL WITH GFR   No orders of the defined types were placed in this encounter.   Face-to-face time spent with patient was 30 minutes. Greater than 50% of time was spent in counseling and coordination of care.  Follow-Up Instructions: Return in about 3 months (around 09/02/2019) for Rheumatoid arthritis.   Sherron Ales, PA-C  I examined and evaluated the patient with Sherron Ales PA.  Patient continues to have some synovitis in extensor tenosynovitis in her right wrist.  We discussed switching to different treatment options including Actemra, Carlis Abbott or Harriette Ohara.  Patient decided to stay on the current regimen.  We discussed increasing methotrexate to 1.0 mL subcu weekly.  She was in agreement.  We will increase methotrexate to 1 mL subcu weekly.  She will get labs in 2 weeks, 2 months and then every 3 months to monitor for drug toxicity.  The plan of care was discussed as noted above.  [name], MD  Note - This record has been created using Animal nutritionist.  Chart creation errors have been sought, but may not always  have been located. Such creation errors do not reflect on  the standard of medical care.

## 2019-05-31 ENCOUNTER — Other Ambulatory Visit: Payer: Self-pay

## 2019-05-31 ENCOUNTER — Encounter (HOSPITAL_COMMUNITY): Payer: Self-pay | Admitting: Psychiatry

## 2019-05-31 ENCOUNTER — Ambulatory Visit (INDEPENDENT_AMBULATORY_CARE_PROVIDER_SITE_OTHER): Payer: Medicare HMO | Admitting: Psychiatry

## 2019-05-31 DIAGNOSIS — R69 Illness, unspecified: Secondary | ICD-10-CM | POA: Diagnosis not present

## 2019-05-31 DIAGNOSIS — F3342 Major depressive disorder, recurrent, in full remission: Secondary | ICD-10-CM | POA: Diagnosis not present

## 2019-05-31 MED ORDER — SERTRALINE HCL 100 MG PO TABS: 150.0000 mg | ORAL_TABLET | Freq: Every day | ORAL | 1 refills | Status: DC

## 2019-05-31 NOTE — Patient Instructions (Signed)
1. Continue sertraline 150 mg daily  2.Discontinue hydroxyzine 3.Next appointment: 9/6 at 9:10

## 2019-06-02 ENCOUNTER — Other Ambulatory Visit: Payer: Self-pay

## 2019-06-02 ENCOUNTER — Ambulatory Visit: Payer: Medicare HMO | Admitting: Physician Assistant

## 2019-06-02 ENCOUNTER — Encounter: Payer: Self-pay | Admitting: Physician Assistant

## 2019-06-02 VITALS — BP 134/81 | HR 56 | Resp 15 | Ht 62.0 in | Wt 327.0 lb

## 2019-06-02 DIAGNOSIS — Z8719 Personal history of other diseases of the digestive system: Secondary | ICD-10-CM | POA: Diagnosis not present

## 2019-06-02 DIAGNOSIS — M0609 Rheumatoid arthritis without rheumatoid factor, multiple sites: Secondary | ICD-10-CM

## 2019-06-02 DIAGNOSIS — E559 Vitamin D deficiency, unspecified: Secondary | ICD-10-CM

## 2019-06-02 DIAGNOSIS — M19072 Primary osteoarthritis, left ankle and foot: Secondary | ICD-10-CM

## 2019-06-02 DIAGNOSIS — I10 Essential (primary) hypertension: Secondary | ICD-10-CM

## 2019-06-02 DIAGNOSIS — Z79899 Other long term (current) drug therapy: Secondary | ICD-10-CM

## 2019-06-02 DIAGNOSIS — Z96653 Presence of artificial knee joint, bilateral: Secondary | ICD-10-CM | POA: Diagnosis not present

## 2019-06-02 DIAGNOSIS — Z8659 Personal history of other mental and behavioral disorders: Secondary | ICD-10-CM

## 2019-06-02 DIAGNOSIS — M19071 Primary osteoarthritis, right ankle and foot: Secondary | ICD-10-CM | POA: Diagnosis not present

## 2019-06-02 DIAGNOSIS — Z9889 Other specified postprocedural states: Secondary | ICD-10-CM

## 2019-06-02 DIAGNOSIS — I5032 Chronic diastolic (congestive) heart failure: Secondary | ICD-10-CM

## 2019-06-02 DIAGNOSIS — Z8669 Personal history of other diseases of the nervous system and sense organs: Secondary | ICD-10-CM

## 2019-06-02 DIAGNOSIS — R7303 Prediabetes: Secondary | ICD-10-CM

## 2019-06-02 DIAGNOSIS — M5030 Other cervical disc degeneration, unspecified cervical region: Secondary | ICD-10-CM

## 2019-06-02 DIAGNOSIS — Z8261 Family history of arthritis: Secondary | ICD-10-CM

## 2019-06-02 DIAGNOSIS — E890 Postprocedural hypothyroidism: Secondary | ICD-10-CM

## 2019-06-02 NOTE — Patient Instructions (Addendum)
Standing Labs We placed an order today for your standing lab work.    Please come back and get your standing labs in 2 weeks, 2 months and every 3 months   We have open lab daily Monday through Thursday from 8:30-12:30 PM and 1:30-4:30 PM and Friday from 8:30-12:30 PM and 1:30-4:00 PM at the office of Dr. [name].   You may experience shorter wait times on Monday and Friday afternoons. The office is located at [address], [place], [place] [address] No appointment is necessary.   Labs are drawn by [facility].  You may receive a bill from [facility] for your lab work.  If you wish to have your labs drawn at another location, please call the office 24 hours in advance to send orders.  If you have any questions regarding directions or hours of operation,  please call [phone].   Just as a reminder please drink plenty of water prior to coming for your lab work. Thanks!

## 2019-06-03 LAB — COMPLETE METABOLIC PANEL WITH GFR
AG Ratio: 1.1 (calc) (ref 1.0–2.5)
ALT: 10 U/L (ref 6–29)
AST: 14 U/L (ref 10–35)
Albumin: 3.8 g/dL (ref 3.6–5.1)
Alkaline phosphatase (APISO): 78 U/L (ref 37–153)
BUN: 13 mg/dL (ref 7–25)
CO2: 30 mmol/L (ref 20–32)
Calcium: 9.5 mg/dL (ref 8.6–10.4)
Chloride: 101 mmol/L (ref 98–110)
Creat: 0.8 mg/dL (ref 0.50–1.05)
GFR, Est African American: 96 mL/min/{1.73_m2} (ref >=60)
GFR, Est Non African American: 82 mL/min/{1.73_m2} (ref >=60)
Globulin: 3.4 g/dL (calc) (ref 1.9–3.7)
Glucose, Bld: 77 mg/dL (ref 65–99)
Potassium: 4.1 mmol/L (ref 3.5–5.3)
Sodium: 139 mmol/L (ref 135–146)
Total Bilirubin: 0.5 mg/dL (ref 0.2–1.2)
Total Protein: 7.2 g/dL (ref 6.1–8.1)

## 2019-06-03 LAB — CBC WITH DIFFERENTIAL/PLATELET
Absolute Monocytes: 474 cells/uL (ref 200–950)
Basophils Absolute: 52 cells/uL (ref 0–200)
Basophils Relative: 0.7 %
Eosinophils Absolute: 133 cells/uL (ref 15–500)
Eosinophils Relative: 1.8 %
HCT: 38.2 % (ref 35.0–45.0)
Hemoglobin: 11.8 g/dL (ref 11.7–15.5)
Lymphs Abs: 3552 cells/uL (ref 850–3900)
MCH: 24.3 pg — ABNORMAL LOW (ref 27.0–33.0)
MCHC: 30.9 g/dL — ABNORMAL LOW (ref 32.0–36.0)
MCV: 78.6 fL — ABNORMAL LOW (ref 80.0–100.0)
MPV: 10.2 fL (ref 7.5–12.5)
Monocytes Relative: 6.4 %
Neutro Abs: 3189 cells/uL (ref 1500–7800)
Neutrophils Relative %: 43.1 %
Platelets: 405 10*3/uL — ABNORMAL HIGH (ref 140–400)
RBC: 4.86 10*6/uL (ref 3.80–5.10)
RDW: 14.3 % (ref 11.0–15.0)
Total Lymphocyte: 48 %
WBC: 7.4 10*3/uL (ref 3.8–10.8)

## 2019-06-03 NOTE — Progress Notes (Signed)
CBC stable. CMP WNL.

## 2019-06-11 ENCOUNTER — Ambulatory Visit: Payer: Medicare HMO

## 2019-06-16 ENCOUNTER — Encounter: Payer: Self-pay | Admitting: Cardiology

## 2019-06-16 ENCOUNTER — Telehealth (INDEPENDENT_AMBULATORY_CARE_PROVIDER_SITE_OTHER): Payer: Medicare HMO | Admitting: Cardiology

## 2019-06-16 VITALS — BP 132/68 | Ht 62.0 in | Wt 328.0 lb

## 2019-06-16 DIAGNOSIS — R002 Palpitations: Secondary | ICD-10-CM

## 2019-06-16 DIAGNOSIS — R0602 Shortness of breath: Secondary | ICD-10-CM | POA: Diagnosis not present

## 2019-06-16 DIAGNOSIS — Z7189 Other specified counseling: Secondary | ICD-10-CM | POA: Diagnosis not present

## 2019-06-16 NOTE — Progress Notes (Signed)
Virtual Visit via Telephone Note   This visit type was conducted due to national recommendations for restrictions regarding the COVID-19 Pandemic (e.g. social distancing) in an effort to limit this patient's exposure and mitigate transmission in our community.  Due to her co-morbid illnesses, this patient is at least at moderate risk for complications without adequate follow up.  This format is felt to be most appropriate for this patient at this time.  The patient did not have access to video technology/had technical difficulties with video requiring transitioning to audio format only (telephone).  All issues noted in this document were discussed and addressed.  No physical exam could be performed with this format.  Please refer to the patient's chart for her  consent to telehealth for [facility]   The patient was identified using 2 identifiers.  Date:  06/16/2019   ID:  [name], DOB 18-Jun-1960, MRN [id number]  Patient Location: Home Provider Location: Office  PCP:  Kerri Perches, MD  Cardiologist:  Dina Rich, MD  Electrophysiologist:  None   Evaluation Performed:  Follow-Up Visit  Chief Complaint:  Follow up visit  History of Present Illness:    [name] is a 59 y.o. female seen today for follow up of the following medical problems.   1. Palpitations - 05/2015 heart monitor with PACs and PVCs, 6 beat run of NSVT. - reports symptoms few times a day. Can occur at rest or with exertion. Lasts for approx 5 minutes. No other signicant symptoms during episodes - occasional coffee, iced tea every other day x1 glass, no sodas, no energy drinks, no EtoH.   - lopressor stopped by another provider due to her severe depression, we started diltiazem 30mg  bid as alternative.   - no recent palpitations, she is off meds.   2. DOE - echo 04/2015 LVEF 60-65%, normal diastolic function, mild LAE - starting to go to gym, increasing her physical  activity. Working on diet and weight loss.   - denies any SOB or DOE - working out regularly  SH: mother is also a patient of mine, 05/2015  Had first covid vaccine.     The patient does not have symptoms concerning for COVID-19 infection (fever, chills, cough, or new shortness of breath).    Past Medical History:  Diagnosis Date  . Chronic back pain   . Degenerative joint disease    Left TKA in 07/2009-Dr. Landau  . Depression 2001   doesn't take any meds  . Headache(784.0)    occasionally  . Hypothyroidism    was on Levothyroxine-has been off x 4 months  . Joint pain   . Joint swelling   . Morbid obesity (HCC)   . Nocturia   . Osteoarthritis of right knee 07/13/2013  . Peripheral edema    to right leg;takes Furosemide occasionally and hasnt had one in a month  . Syncope    with palpitations; evaluated by [facility] in 2008 with normal echo   Past Surgical History:  Procedure Laterality Date  . ABDOMINAL HYSTERECTOMY  1995    no oophorectomy  . CARPAL TUNNEL RELEASE  1991   Bilateral  . CESAREAN SECTION     X2  . CHOLECYSTECTOMY    . CHOLECYSTECTOMY, LAPAROSCOPIC  2007   Dr. 2008  . COLONOSCOPY N/A 03/03/2013   Procedure: COLONOSCOPY;  Surgeon: 03/05/2013, MD;  [facility]: [facility];  Service: Endoscopy;  Laterality: N/A;  930  . CRANIECTOMY  SUBOCCIPITAL W/ CERVICAL LAMINECTOMY / CHIARI  2007   Repair of Arnold-Chiari malformation  . EYE SURGERY     bilateral cataract surgery  . JOINT REPLACEMENT Left 2011  . KNEE ARTHROSCOPY  1995   Dr. Cleophas Dunker   . THYROIDECTOMY, PARTIAL  2004   Left lobectomy - large benign nodule  . TONSILLECTOMY    . TOTAL KNEE ARTHROPLASTY  07/2009   Left knee , Dr. Dion Saucier   . TOTAL KNEE ARTHROPLASTY Right 07/13/2013   Procedure: TOTAL KNEE ARTHROPLASTY;  Surgeon: Eulas Post, MD;  Location: [facility];  Service: Orthopedics;  Laterality: Right;     No outpatient medications have been  marked as taking for the 06/16/19 encounter (Appointment) with Antoine Poche, MD.     Allergies:   Aleve [naproxen sodium], B12 folate [cobalamin combinations], Penicillins, Adhesive [tape], Phentermine, and Latex   Social History   Tobacco Use  . Smoking status: Never Smoker  . Smokeless tobacco: Never Used  Substance Use Topics  . Alcohol use: Yes    Alcohol/week: 0.0 standard drinks    Comment: rarely  . Drug use: No     Family Hx: The patient's family history includes Depression in her mother; Diabetes in her father and mother; Emphysema in her father; Healthy in her daughter, daughter, and daughter; Hyperlipidemia in her father; Rheum arthritis in her sister. There is no history of Colon cancer.  ROS:   Please see the history of present illness.     All other systems reviewed and are negative.   Prior CV studies:   The following studies were reviewed today:  04/2015 Echo Study Conclusions  - Procedure narrative: Transthoracic echocardiography for left  ventricular function evaluation, for right ventricular function  evaluation, and for assessment of valvular function. Image  quality was adequate. The study was technically difficult, as a  result of poor acoustic windows and body habitus. - Left ventricle: The cavity size was normal. Wall thickness was  increased in a pattern of mild LVH. Systolic function was normal.  The estimated ejection fraction was in the range of 60% to 65%.  Images were inadequate for LV wall motion assessment. Left  ventricular diastolic function parameters were normal. - Left atrium: The atrium was mildly dilated.  05/2015 Event monitor  Telemetry strips show normal sinus rhythm with occasional PACs and PVCs  Reported symptoms correlate with normal sinus rhythm and PACs  Detected 6 beat run of NSVT that was asymptomatic.  Labs/Other Tests and Data Reviewed:    EKG:  No ECG reviewed.  Recent Labs: 06/02/2019: ALT 10;  BUN 13; Creat 0.80; Hemoglobin 11.8; Platelets 405; Potassium 4.1; Sodium 139   Recent Lipid Panel Lab Results  Component Value Date/Time   CHOL 147 04/14/2018 08:58 AM   TRIG 94 04/14/2018 08:58 AM   HDL 44 (L) 04/14/2018 08:58 AM   CHOLHDL 3.3 04/14/2018 08:58 AM   LDLCALC 84 04/14/2018 08:58 AM    Wt Readings from Last 3 Encounters:  06/02/19 (!) 327 lb (148.3 kg)  05/05/19 (!) 321 lb (145.6 kg)  04/08/19 (!) 331 lb (150.1 kg)     Objective:    Vital Signs:   Today's Vitals   06/16/19 1552  BP: 132/68  Weight: (!) 328 lb (148.8 kg)  Height: 5\' 2"  (1.575 m)   Body mass index is 59.99 kg/m. Normal affect. Normal speech pattern and tone. Comfortable, no apparent distress. No audible signs of sob or wheezing.   ASSESSMENT & PLAN:  1. Palpitations - no symptoms, continue current meds  2. DOE - echo without significant pathology.Symptoms improving with increased activity and exercise - no further workup at this time.     COVID-19 Education: The signs and symptoms of COVID-19 were discussed with the patient and how to seek care for testing (follow up with PCP or arrange E-visit).  The importance of social distancing was discussed today.  Time:   Today, I have spent 18 minutes with the patient with telehealth technology discussing the above problems.     Medication Adjustments/Labs and Tests Ordered: Current medicines are reviewed at length with the patient today.  Concerns regarding medicines are outlined above.   Tests Ordered: No orders of the defined types were placed in this encounter.   Medication Changes: No orders of the defined types were placed in this encounter.   Follow Up:  As needed  Signed, Carlyle Dolly, MD  06/16/2019 2:51 PM    [place]

## 2019-06-16 NOTE — Patient Instructions (Signed)
Your physician recommends that you schedule a follow-up appointment in: AS NEEDED WITH DR BRANCH  Your physician recommends that you continue on your current medications as directed. Please refer to the Current Medication list given to you today.  Thank you for choosing [facility]!!    

## 2019-06-17 ENCOUNTER — Telehealth: Payer: Medicare HMO | Admitting: Family Medicine

## 2019-06-24 ENCOUNTER — Telehealth (INDEPENDENT_AMBULATORY_CARE_PROVIDER_SITE_OTHER): Payer: Medicare HMO | Admitting: Family Medicine

## 2019-06-24 ENCOUNTER — Encounter: Payer: Self-pay | Admitting: Family Medicine

## 2019-06-24 ENCOUNTER — Other Ambulatory Visit: Payer: Self-pay

## 2019-06-24 VITALS — BP 132/68 | Ht 62.0 in | Wt 312.0 lb

## 2019-06-24 DIAGNOSIS — Z6841 Body Mass Index (BMI) 40.0 and over, adult: Secondary | ICD-10-CM | POA: Diagnosis not present

## 2019-06-24 DIAGNOSIS — M5430 Sciatica, unspecified side: Secondary | ICD-10-CM | POA: Diagnosis not present

## 2019-06-24 DIAGNOSIS — M2550 Pain in unspecified joint: Secondary | ICD-10-CM | POA: Diagnosis not present

## 2019-06-24 DIAGNOSIS — M549 Dorsalgia, unspecified: Secondary | ICD-10-CM

## 2019-06-24 DIAGNOSIS — R7303 Prediabetes: Secondary | ICD-10-CM

## 2019-06-24 DIAGNOSIS — I10 Essential (primary) hypertension: Secondary | ICD-10-CM | POA: Diagnosis not present

## 2019-06-24 DIAGNOSIS — Z79899 Other long term (current) drug therapy: Secondary | ICD-10-CM

## 2019-06-24 LAB — CBC WITH DIFFERENTIAL/PLATELET
Absolute Monocytes: 623 cells/uL (ref 200–950)
Basophils Absolute: 38 cells/uL (ref 0–200)
Basophils Relative: 0.5 %
Eosinophils Absolute: 173 cells/uL (ref 15–500)
Eosinophils Relative: 2.3 %
HCT: 37.8 % (ref 35.0–45.0)
Hemoglobin: 11.6 g/dL — ABNORMAL LOW (ref 11.7–15.5)
Lymphs Abs: 3255 cells/uL (ref 850–3900)
MCH: 24.3 pg — ABNORMAL LOW (ref 27.0–33.0)
MCHC: 30.7 g/dL — ABNORMAL LOW (ref 32.0–36.0)
MCV: 79.1 fL — ABNORMAL LOW (ref 80.0–100.0)
MPV: 10.1 fL (ref 7.5–12.5)
Monocytes Relative: 8.3 %
Neutro Abs: 3413 cells/uL (ref 1500–7800)
Neutrophils Relative %: 45.5 %
Platelets: 379 10*3/uL (ref 140–400)
RBC: 4.78 10*6/uL (ref 3.80–5.10)
RDW: 14.8 % (ref 11.0–15.0)
Total Lymphocyte: 43.4 %
WBC: 7.5 10*3/uL (ref 3.8–10.8)

## 2019-06-24 LAB — COMPLETE METABOLIC PANEL WITH GFR
AG Ratio: 1.2 (calc) (ref 1.0–2.5)
ALT: 9 U/L (ref 6–29)
AST: 14 U/L (ref 10–35)
Albumin: 3.8 g/dL (ref 3.6–5.1)
Alkaline phosphatase (APISO): 75 U/L (ref 37–153)
BUN: 17 mg/dL (ref 7–25)
CO2: 32 mmol/L (ref 20–32)
Calcium: 9.2 mg/dL (ref 8.6–10.4)
Chloride: 100 mmol/L (ref 98–110)
Creat: 0.74 mg/dL (ref 0.50–1.05)
GFR, Est African American: 105 mL/min/{1.73_m2} (ref >=60)
GFR, Est Non African American: 91 mL/min/{1.73_m2} (ref >=60)
Globulin: 3.3 g/dL (calc) (ref 1.9–3.7)
Glucose, Bld: 111 mg/dL — ABNORMAL HIGH (ref 65–99)
Potassium: 4.4 mmol/L (ref 3.5–5.3)
Sodium: 137 mmol/L (ref 135–146)
Total Bilirubin: 0.6 mg/dL (ref 0.2–1.2)
Total Protein: 7.1 g/dL (ref 6.1–8.1)

## 2019-06-24 MED ORDER — IBUPROFEN 800 MG PO TABS: ORAL_TABLET | ORAL | 0 refills | Status: DC

## 2019-06-24 MED ORDER — PHENTERMINE HCL 37.5 MG PO TABS: 37.5000 mg | ORAL_TABLET | Freq: Every day | ORAL | 1 refills | Status: DC

## 2019-06-24 NOTE — Patient Instructions (Signed)
I appreciate the opportunity to provide you with care for your health and wellness. Today we discussed: overall health and weight   Follow up: 5 months in office   Labs 1 week before -fasting   Please continue to practice social distancing to keep you, your family, and our community safe.  If you must go out, please wear a mask and practice good handwashing.  It was a pleasure to see you and I look forward to continuing to work together on your health and well-being. Please do not hesitate to call the office if you need care or have questions about your care.  Have a wonderful day and week. With Gratitude, Tereasa Coop, DNP, AGNP-BC

## 2019-06-24 NOTE — Progress Notes (Signed)
Virtual Visit via Video Note   This visit type was conducted due to national recommendations for restrictions regarding the COVID-19 Pandemic (e.g. social distancing) in an effort to limit this patient's exposure and mitigate transmission in our community.  Due to her co-morbid illnesses, this patient is at least at moderate risk for complications without adequate follow up.  This format is felt to be most appropriate for this patient at this time.  All issues noted in this document were discussed and addressed.  A limited physical exam was performed with this format.    Evaluation Performed:  Follow-up visit  Date:  06/24/2019   ID:  [name], DOB May 17, 1960, MRN [id number]  Patient Location: Home Provider Location: Office  Location of Patient: Home Location of Provider: Telehealth Consent was obtain for visit to be over via telehealth. I verified that I am speaking with the correct person using two identifiers.  PCP:  Kerri Perches, MD   Chief Complaint: Chronic condition follow-up.  History of Present Illness:    [name] is a 59 y.o. female with extensive history including hypothyroidism, obesity, osteoarthritis and joint pain.  Follow-up today for obesity is been on phentermine.  Has lost some weight reports that she has been trying to eat healthier.  Not currently physically exercising as much as she could.  But overall is trying to do better.  She is aware that she cannot stay on phentermine long-term.  Secondary to side effects.  And is willing to stop in the next month or 2.  Overall she has no complaints to discuss today.  Taking all her medications without any issues.  Does need a refill of her ibuprofen she uses for her joint and back discomfort.  Only takes it once to twice a week.  The patient does not have symptoms concerning for COVID-19 infection (fever, chills, cough, or new shortness of breath).   Past Medical, Surgical, Social History,  Allergies, and Medications have been Reviewed.  Past Medical History:  Diagnosis Date  . Chronic back pain   . Degenerative joint disease    Left TKA in 07/2009-Dr. Landau  . Depression 2001   doesn't take any meds  . Headache(784.0)    occasionally  . Hypothyroidism    was on Levothyroxine-has been off x 4 months  . Joint pain   . Joint swelling   . Morbid obesity (HCC)   . Nocturia   . Osteoarthritis of right knee 07/13/2013  . Peripheral edema    to right leg;takes Furosemide occasionally and hasnt had one in a month  . Syncope    with palpitations; evaluated by [facility] and [facility] in 2008 with normal echo   Past Surgical History:  Procedure Laterality Date  . ABDOMINAL HYSTERECTOMY  1995    no oophorectomy  . CARPAL TUNNEL RELEASE  1991   Bilateral  . CESAREAN SECTION     X2  . CHOLECYSTECTOMY    . CHOLECYSTECTOMY, LAPAROSCOPIC  2007   Dr. Lovell Sheehan  . COLONOSCOPY N/A 03/03/2013   Procedure: COLONOSCOPY;  Surgeon: Malissa Hippo, MD;  [facility];  Service: Endoscopy;  Laterality: N/A;  930  . CRANIECTOMY SUBOCCIPITAL W/ CERVICAL LAMINECTOMY / CHIARI  2007   Repair of Arnold-Chiari malformation  . EYE SURGERY     bilateral cataract surgery  . JOINT REPLACEMENT Left 2011  . KNEE ARTHROSCOPY  1995   Dr. Cleophas Dunker   . THYROIDECTOMY, PARTIAL  2004  Left lobectomy - large benign nodule  . TONSILLECTOMY    . TOTAL KNEE ARTHROPLASTY  07/2009   Left knee , Dr. Mardelle Matte   . TOTAL KNEE ARTHROPLASTY Right 07/13/2013   Procedure: TOTAL KNEE ARTHROPLASTY;  Surgeon: Johnny Bridge, MD;  Location: [place];  Service: Orthopedics;  Laterality: Right;     Current Meds  Medication Sig  . Abatacept (ORENCIA CLICKJECT) 875 MG/ML SOAJ Inject into the skin once a week.   . folic acid (FOLVITE) 1 MG tablet Take 2 tablets (2 mg total) by mouth daily.  . hydrochlorothiazide (HYDRODIURIL) 25 MG tablet Take 1 tablet (25 mg total) by mouth daily.  Marland Kitchen ibuprofen (ADVIL)  800 MG tablet TAKE ONE TABLET BY MOUTH TWICE WEEKLY AS NEEDED, FOR KNEE OR BACK PAIN  . metFORMIN (GLUCOPHAGE) 500 MG tablet Take 1 tablet (500 mg total) by mouth daily.  . Methotrexate Sodium (METHOTREXATE, PF,) 50 MG/2ML injection Inject 0.8 mL under the skin weekly.  . phentermine (ADIPEX-P) 37.5 MG tablet Take 1 tablet (37.5 mg total) by mouth daily before breakfast.  . [START ON 07/17/2019] sertraline (ZOLOFT) 100 MG tablet Take 1.5 tablets (150 mg total) by mouth daily.  . TUBERCULIN SYR 1CC/27GX1/2" (B-D TB SYRINGE 1CC/27GX1/2") 27G X 1/2" 1 ML MISC 12 Syringes by Does not apply route once a week.     Allergies:   Aleve [naproxen sodium], B12 folate [cobalamin combinations], Penicillins, Adhesive [tape], Phentermine, and Latex   ROS:   Please see the history of present illness.    All other systems reviewed and are negative.   Labs/Other Tests and Data Reviewed:    Recent Labs: 06/02/2019: ALT 10; BUN 13; Creat 0.80; Hemoglobin 11.8; Platelets 405; Potassium 4.1; Sodium 139   Recent Lipid Panel Lab Results  Component Value Date/Time   CHOL 147 04/14/2018 08:58 AM   TRIG 94 04/14/2018 08:58 AM   HDL 44 (L) 04/14/2018 08:58 AM   CHOLHDL 3.3 04/14/2018 08:58 AM   LDLCALC 84 04/14/2018 08:58 AM    Wt Readings from Last 3 Encounters:  06/24/19 (!) 312 lb (141.5 kg)  06/16/19 (!) 328 lb (148.8 kg)  06/02/19 (!) 327 lb (148.3 kg)     Objective:    Vital Signs:  BP 132/68   Ht 5\' 2"  (1.575 m)   Wt (!) 312 lb (141.5 kg)   BMI 57.07 kg/m    VITAL SIGNS:  reviewed GEN:  alert and oriented EYES:  sclerae anicteric, EOMI - Extraocular Movements Intact RESPIRATORY:  no shortness of breath of in conversation SKIN:  no rash, lesions or ulcers. MUSCULOSKELETAL:  no obvious deformities. NEURO:  alert and oriented x 3, no obvious focal deficit PSYCH:  normal affect   ASSESSMENT & PLAN:    1. Morbid obesity (HCC)  - phentermine (ADIPEX-P) 37.5 MG tablet; Take 1 tablet (37.5  mg total) by mouth daily before breakfast.  Dispense: 30 tablet; Refill: 1  2. Back pain with sciatica - ibuprofen (ADVIL) 800 MG tablet; Take 1 tablet by mouth twice weekly as needed for knee or back pain  Dispense: 30 tablet; Refill: 0  3. Generalized joint pain - ibuprofen (ADVIL) 800 MG tablet; Take 1 tablet by mouth twice weekly as needed for knee or back pain  Dispense: 30 tablet; Refill: 0   Time:   Today, I have spent 15 minutes with the patient with telehealth technology discussing the above problems.     Medication Adjustments/Labs and Tests Ordered: Current medicines are reviewed at  length with the patient today.  Concerns regarding medicines are outlined above.   Tests Ordered: No orders of the defined types were placed in this encounter.   Medication Changes: No orders of the defined types were placed in this encounter.   Disposition:  Follow up 5 months  Signed, Freddy Finner, NP  06/24/2019 10:29 AM     [facility]

## 2019-06-24 NOTE — Assessment & Plan Note (Signed)
Overall joint discomfort.  Occasional use of ibuprofen.  For that and back pain will provide refill of this encouraged not to use more than twice a week.

## 2019-06-24 NOTE — Assessment & Plan Note (Signed)
Intermittent need for ibuprofen high-dose, prescribed only to take twice a week.  As conflicting medications that she is on.  She reports that she does not take this but as needed.

## 2019-06-24 NOTE — Assessment & Plan Note (Signed)
Improved  Lenox [name] is re-educated about the importance of exercise daily to help with weight management. A minumum of 30 minutes daily is recommended. Additionally, importance of healthy food choices  with portion control discussed.  Continue phentermine for 2 more months after that we will stop it.  Advised that we will continue this for longer than 6 months usually at a time.  Encouraged her to keep working on exercise and diet. Wt Readings from Last 3 Encounters:  06/24/19 (!) 312 lb (141.5 kg)  06/16/19 (!) 328 lb (148.8 kg)  06/02/19 (!) 327 lb (148.3 kg)

## 2019-06-25 DIAGNOSIS — H52209 Unspecified astigmatism, unspecified eye: Secondary | ICD-10-CM | POA: Diagnosis not present

## 2019-06-25 DIAGNOSIS — Z0100 Encounter for examination of eyes and vision without abnormal findings: Secondary | ICD-10-CM | POA: Diagnosis not present

## 2019-06-25 DIAGNOSIS — Z961 Presence of intraocular lens: Secondary | ICD-10-CM | POA: Diagnosis not present

## 2019-06-28 DIAGNOSIS — R69 Illness, unspecified: Secondary | ICD-10-CM | POA: Diagnosis not present

## 2019-07-15 ENCOUNTER — Other Ambulatory Visit: Payer: Self-pay | Admitting: Rheumatology

## 2019-07-15 DIAGNOSIS — M0609 Rheumatoid arthritis without rheumatoid factor, multiple sites: Secondary | ICD-10-CM

## 2019-07-16 ENCOUNTER — Other Ambulatory Visit: Payer: Self-pay

## 2019-07-16 DIAGNOSIS — Z79899 Other long term (current) drug therapy: Secondary | ICD-10-CM | POA: Diagnosis not present

## 2019-07-16 NOTE — Telephone Encounter (Signed)
Last Visit: 06/02/2019 Next Visit: 09/01/2019 Labs: 06/24/2019 Glucose is 111. Rest of CMP WNL. Anemia is stable. Plts WNL.We will continue to monitor.   Per office note on 06/02/2019, She will increase methotrexate to 1 mL subcu injection once weekly.   Okay to refill per Dr. Corliss Skains.

## 2019-07-17 LAB — COMPLETE METABOLIC PANEL WITH GFR
AG Ratio: 1.2 (calc) (ref 1.0–2.5)
ALT: 9 U/L (ref 6–29)
AST: 13 U/L (ref 10–35)
Albumin: 3.9 g/dL (ref 3.6–5.1)
Alkaline phosphatase (APISO): 79 U/L (ref 37–153)
BUN: 14 mg/dL (ref 7–25)
CO2: 26 mmol/L (ref 20–32)
Calcium: 8.9 mg/dL (ref 8.6–10.4)
Chloride: 104 mmol/L (ref 98–110)
Creat: 0.68 mg/dL (ref 0.50–1.05)
GFR, Est African American: 113 mL/min/{1.73_m2} (ref >=60)
GFR, Est Non African American: 98 mL/min/{1.73_m2} (ref >=60)
Globulin: 3.2 g/dL (calc) (ref 1.9–3.7)
Glucose, Bld: 103 mg/dL — ABNORMAL HIGH (ref 65–99)
Potassium: 4.6 mmol/L (ref 3.5–5.3)
Sodium: 140 mmol/L (ref 135–146)
Total Bilirubin: 0.6 mg/dL (ref 0.2–1.2)
Total Protein: 7.1 g/dL (ref 6.1–8.1)

## 2019-07-17 LAB — CBC WITH DIFFERENTIAL/PLATELET
Absolute Monocytes: 436 cells/uL (ref 200–950)
Basophils Absolute: 27 cells/uL (ref 0–200)
Basophils Relative: 0.4 %
Eosinophils Absolute: 248 cells/uL (ref 15–500)
Eosinophils Relative: 3.7 %
HCT: 38.2 % (ref 35.0–45.0)
Hemoglobin: 11.8 g/dL (ref 11.7–15.5)
Lymphs Abs: 2667 cells/uL (ref 850–3900)
MCH: 24.4 pg — ABNORMAL LOW (ref 27.0–33.0)
MCHC: 30.9 g/dL — ABNORMAL LOW (ref 32.0–36.0)
MCV: 79.1 fL — ABNORMAL LOW (ref 80.0–100.0)
MPV: 10.1 fL (ref 7.5–12.5)
Monocytes Relative: 6.5 %
Neutro Abs: 3323 cells/uL (ref 1500–7800)
Neutrophils Relative %: 49.6 %
Platelets: 382 10*3/uL (ref 140–400)
RBC: 4.83 10*6/uL (ref 3.80–5.10)
RDW: 14.8 % (ref 11.0–15.0)
Total Lymphocyte: 39.8 %
WBC: 6.7 10*3/uL (ref 3.8–10.8)

## 2019-07-19 NOTE — Progress Notes (Signed)
MCV is low, all other labs are normal.  Please make sure that patient is taking folic acid.

## 2019-07-21 ENCOUNTER — Other Ambulatory Visit: Payer: Self-pay | Admitting: *Deleted

## 2019-07-21 MED ORDER — ORENCIA CLICKJECT 125 MG/ML ~~LOC~~ SOAJ
125.0000 mg | SUBCUTANEOUS | 2 refills | Status: DC
Start: 2019-07-21 — End: 2019-10-12

## 2019-07-21 NOTE — Telephone Encounter (Signed)
Refill request received via fax  Current Dose per office note on 06/02/2019: Orencia 125 mg sq injections once weekly  Last Visit: 06/02/2019 Next Visit: 09/01/2019 Labs: 06/24/2019 Glucose is 111. Rest of CMP WNL. Anemia is stable. Plts WNL.We will continue to monitor.  TB Gold: 11/30/2018 Neg   Okay to refill per Dr. Corliss Skains

## 2019-07-28 ENCOUNTER — Encounter: Payer: Self-pay | Admitting: Family Medicine

## 2019-07-30 NOTE — Telephone Encounter (Signed)
Noted thank you for updating

## 2019-07-30 NOTE — Telephone Encounter (Signed)
Please review previous time frames. Thank you

## 2019-07-30 NOTE — Telephone Encounter (Signed)
Pt did have a permanent placard will redo form and call pt

## 2019-07-31 DIAGNOSIS — Z008 Encounter for other general examination: Secondary | ICD-10-CM | POA: Diagnosis not present

## 2019-07-31 DIAGNOSIS — E119 Type 2 diabetes mellitus without complications: Secondary | ICD-10-CM | POA: Diagnosis not present

## 2019-07-31 DIAGNOSIS — M069 Rheumatoid arthritis, unspecified: Secondary | ICD-10-CM | POA: Diagnosis not present

## 2019-07-31 DIAGNOSIS — Z6841 Body Mass Index (BMI) 40.0 and over, adult: Secondary | ICD-10-CM | POA: Diagnosis not present

## 2019-07-31 DIAGNOSIS — Z791 Long term (current) use of non-steroidal anti-inflammatories (NSAID): Secondary | ICD-10-CM | POA: Diagnosis not present

## 2019-07-31 DIAGNOSIS — R69 Illness, unspecified: Secondary | ICD-10-CM | POA: Diagnosis not present

## 2019-07-31 DIAGNOSIS — G8929 Other chronic pain: Secondary | ICD-10-CM | POA: Diagnosis not present

## 2019-07-31 DIAGNOSIS — G4700 Insomnia, unspecified: Secondary | ICD-10-CM | POA: Diagnosis not present

## 2019-07-31 DIAGNOSIS — I10 Essential (primary) hypertension: Secondary | ICD-10-CM | POA: Diagnosis not present

## 2019-07-31 DIAGNOSIS — M1990 Unspecified osteoarthritis, unspecified site: Secondary | ICD-10-CM | POA: Diagnosis not present

## 2019-08-10 ENCOUNTER — Other Ambulatory Visit: Payer: Self-pay | Admitting: Rheumatology

## 2019-08-10 DIAGNOSIS — M0609 Rheumatoid arthritis without rheumatoid factor, multiple sites: Secondary | ICD-10-CM

## 2019-08-11 ENCOUNTER — Telehealth: Payer: Self-pay | Admitting: Rheumatology

## 2019-08-11 NOTE — Telephone Encounter (Signed)
Attempted to contact the patient and left message for patient to call the office. Prescription for MTX was sent to pharmacy on 07/16/2019 for 12 mL.

## 2019-08-11 NOTE — Telephone Encounter (Signed)
Patient advised we sent in prescription for MTX 12 mL on 07/16/2019. Patient states she will contact the pharmacy and will call back if she has any trouble.

## 2019-08-11 NOTE — Telephone Encounter (Signed)
Patient left a voicemail stating [facility] couldn't refill her prescription of Methotrexate because it has not been authorized.  Patient states she is due to take her injection today.  Patient requested a return call.

## 2019-08-19 NOTE — Progress Notes (Deleted)
Office Visit Note  Patient: [name]             Date of Birth: [birth year]           MRN: [id number]             PCP: Kerri Perches, MD Referring: Kerri Perches, MD Visit Date: 09/01/2019 Occupation: @GUAROCC @  Subjective:  No chief complaint on file.   History of Present Illness: [name] is a 59 y.o. female ***   Activities of Daily Living:  Patient reports morning stiffness for *** {minute/hour:19697}.   Patient {ACTIONS;DENIES/REPORTS:21021675::"Denies"} nocturnal pain.  Difficulty dressing/grooming: {ACTIONS;DENIES/REPORTS:21021675::"Denies"} Difficulty climbing stairs: {ACTIONS;DENIES/REPORTS:21021675::"Denies"} Difficulty getting out of chair: {ACTIONS;DENIES/REPORTS:21021675::"Denies"} Difficulty using hands for taps, buttons, cutlery, and/or writing: {ACTIONS;DENIES/REPORTS:21021675::"Denies"}  No Rheumatology ROS completed.   PMFS History:  Patient Active Problem List   Diagnosis Date Noted  . High risk medication use 01/07/2019  . Generalized joint pain 10/20/2018  . Nausea 10/20/2018  . Recurrent major depressive disorder, in full remission (HCC) 04/10/2018  . Allergic dermatitis 02/02/2018  . Major depressive disorder, recurrent episode, moderate (HCC) 08/29/2017  . Back pain with sciatica 11/13/2015  . Prediabetes 07/31/2015  . Knee osteoarthritis 07/13/2013  . Chronic diastolic heart failure (HCC) 01/06/2013  . Snoring disorder 01/03/2013  . Thoracic spine pain 12/29/2012  . Vitamin D deficiency 12/08/2012  . Exercise intolerance 11/26/2011  . Essential hypertension 11/26/2011  . Migraine headache   . 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 Anemia 07/03/2011  . Morbid obesity (HCC) 12/03/2007    Past Medical History:  Diagnosis Date  . Chronic back pain   . Degenerative joint disease    Left TKA in 07/2009-Dr. Landau  . Depression 2001   doesn't take any meds  . Headache(784.0)    occasionally  .  Hypothyroidism    was on Levothyroxine-has been off x 4 months  . Joint pain   . Joint swelling   . Morbid obesity (HCC)   . Nocturia   . Osteoarthritis of right knee 07/13/2013  . Peripheral edema    to right leg;takes Furosemide occasionally and hasnt had one in a month  . Syncope    with palpitations; evaluated by [facility] in 2008 with normal echo    Family History  Problem Relation Age of Onset  . Diabetes Mother   . Depression Mother   . Diabetes Father        emphsema   . Hyperlipidemia Father   . Emphysema Father   . Rheum arthritis Sister   . Healthy Daughter   . Healthy Daughter   . Healthy Daughter   . Colon cancer Neg Hx    Past Surgical History:  Procedure Laterality Date  . ABDOMINAL HYSTERECTOMY  1995    no oophorectomy  . CARPAL TUNNEL RELEASE  1991   Bilateral  . CESAREAN SECTION     X2  . CHOLECYSTECTOMY    . CHOLECYSTECTOMY, LAPAROSCOPIC  2007   Dr. 2008  . COLONOSCOPY N/A 03/03/2013   Procedure: COLONOSCOPY;  Surgeon: 03/05/2013, MD;  [facility];  Service: Endoscopy;  Laterality: N/A;  930  . CRANIECTOMY SUBOCCIPITAL W/ CERVICAL LAMINECTOMY / CHIARI  2007   Repair of Arnold-Chiari malformation  . EYE SURGERY     bilateral cataract surgery  . JOINT REPLACEMENT Left 2011  . KNEE ARTHROSCOPY  1995   Dr. 2012   . THYROIDECTOMY, PARTIAL  2004   Left lobectomy -  large benign nodule  . TONSILLECTOMY    . TOTAL KNEE ARTHROPLASTY  07/2009   Left knee , Dr. Mardelle Matte   . TOTAL KNEE ARTHROPLASTY Right 07/13/2013   Procedure: TOTAL KNEE ARTHROPLASTY;  Surgeon: Johnny Bridge, MD;  Location: [place];  Service: Orthopedics;  Laterality: Right;   Social History   Social History Narrative   Is in a new relationship    Immunization History  Administered Date(s) Administered  . Influenza Split 01/16/2012  . Influenza Whole 02/23/2007, 12/07/2008  . Influenza,inj,Quad PF,6+ Mos 12/07/2012, 02/15/2014, 11/28/2014,  01/09/2016, 04/07/2017, 12/18/2017, 02/16/2019  . Pneumococcal Polysaccharide-23 07/31/2015  . Td 10/23/2005     Objective: Vital Signs: There were no vitals taken for this visit.   Physical Exam   Musculoskeletal Exam: ***  CDAI Exam: CDAI Score: -- Patient Global: --; Provider Global: -- Swollen: --; Tender: -- Joint Exam 09/01/2019   No joint exam has been documented for this visit   There is currently no information documented on the homunculus. Go to the Rheumatology activity and complete the homunculus joint exam.  Investigation: No additional findings.  Imaging: No results found.  Recent Labs: Lab Results  Component Value Date   WBC 6.7 07/16/2019   HGB 11.8 07/16/2019   PLT 382 07/16/2019   NA 140 07/16/2019   K 4.6 07/16/2019   CL 104 07/16/2019   CO2 26 07/16/2019   GLUCOSE 103 (H) 07/16/2019   BUN 14 07/16/2019   CREATININE 0.68 07/16/2019   BILITOT 0.6 07/16/2019   ALKPHOS 81 08/14/2017   AST 13 07/16/2019   ALT 9 07/16/2019   PROT 7.1 07/16/2019   ALBUMIN 4.0 08/14/2017   CALCIUM 8.9 07/16/2019   GFRAA 113 07/16/2019   QFTBGOLDPLUS NEGATIVE 11/30/2018    Speciality Comments: No specialty comments available.  Procedures:  No procedures performed Allergies: Aleve [naproxen sodium], B12 folate [cobalamin combinations], Penicillins, Adhesive [tape], and Latex   Assessment / Plan:     Visit Diagnoses: Rheumatoid arthritis of multiple sites with negative rheumatoid factor (HCC) - RF negative, anti-CCP negative, severe synovitis and radiographic changes with MCP narrowing and juxta-articular osteopenia  High risk medication use - Orencia 125 mg sq injections once weekly, Methotrexate 0.8 ml every 7 days (started in September 2952) and folic acid 1 mg 2 tablets daily  Status post total bilateral knee replacement  Primary osteoarthritis of both feet  DDD (degenerative disc disease), cervical  Vitamin D deficiency  Family history of rheumatoid  arthritis  History of gastroesophageal reflux (GERD)  Chronic diastolic heart failure (HCC)  Essential hypertension  Prediabetes  S/P partial thyroidectomy  History of depression  Hx of migraines  Orders: No orders of the defined types were placed in this encounter.  No orders of the defined types were placed in this encounter.   Face-to-face time spent with patient was *** minutes. Greater than 50% of time was spent in counseling and coordination of care.  Follow-Up Instructions: No follow-ups on file.   Ofilia Neas, PA-C  Note - This record has been created using Dragon software.  Chart creation errors have been sought, but may not always  have been located. Such creation errors do not reflect on  the standard of medical care.

## 2019-08-26 DIAGNOSIS — R69 Illness, unspecified: Secondary | ICD-10-CM | POA: Diagnosis not present

## 2019-09-01 ENCOUNTER — Ambulatory Visit: Payer: Medicare HMO | Admitting: Physician Assistant

## 2019-09-08 ENCOUNTER — Ambulatory Visit: Payer: Medicare HMO | Admitting: Physician Assistant

## 2019-09-10 NOTE — Progress Notes (Signed)
Office Visit Note  Patient: [name]             Date of Birth: 1961-02-24           MRN: [id number]             PCP: Kerri Perches, MD Referring: Kerri Perches, MD Visit Date: 09/15/2019 Occupation: @GUAROCC @  Subjective:  Right wrist pain   History of Present Illness: [name] is a 59 y.o. female with history of seronegative rheumatoid arthritis and osteoarthritis.  Patient is on Orencia 125 mg sq injections once weekly, methotrexate 1.0 mL subcutaneous injections once weekly, and folic acid 2 mg by mouth daily.  [name] increased the dose of methotrexate from 0.8 to 1.0 mL once weekly after [name] last office visit on 06/02/2019.  [name] has not noticed much improvement since increasing the dose of methotrexate.  [name] continues to have pain and swelling in the right wrist in both hands.  [name] reports [name] bilateral knee replacements are doing well.  [name] is not having any increased discomfort in [name] neck but continues to have chronic neck stiffness.  Activities of Daily Living:  Patient reports morning stiffness for 1  hour.   Patient Denies nocturnal pain.  Difficulty dressing/grooming: Denies Difficulty climbing stairs: Reports Difficulty getting out of chair: Denies Difficulty using hands for taps, buttons, cutlery, and/or writing: Reports  Review of Systems  Constitutional: Positive for fatigue.  HENT: Negative for mouth sores, mouth dryness and nose dryness.   Eyes: Negative for itching and dryness.  Respiratory: Negative for shortness of breath and difficulty breathing.   Cardiovascular: Negative for chest pain and palpitations.  Gastrointestinal: Negative for blood in stool, constipation and diarrhea.  Endocrine: Negative for increased urination.  Genitourinary: Positive for nocturia. Negative for difficulty urinating and painful urination.  Musculoskeletal: Positive for arthralgias, joint pain, joint swelling and morning stiffness. Negative for  myalgias, muscle tenderness and myalgias.  Skin: Positive for rash. Negative for color change.  Allergic/Immunologic: Negative for susceptible to infections.  Neurological: Negative for dizziness, numbness, headaches, memory loss and weakness.  Hematological: Positive for bruising/bleeding tendency.  Psychiatric/Behavioral: Negative for confusion.    PMFS History:  Patient Active Problem List   Diagnosis Date Noted  . High risk medication use 01/07/2019  . Generalized joint pain 10/20/2018  . Nausea 10/20/2018  . Recurrent major depressive disorder, in full remission (HCC) 04/10/2018  . Allergic dermatitis 02/02/2018  . Major depressive disorder, recurrent episode, moderate (HCC) 08/29/2017  . Back pain with sciatica 11/13/2015  . Prediabetes 07/31/2015  . Knee osteoarthritis 07/13/2013  . Chronic diastolic heart failure (HCC) 01/06/2013  . Snoring disorder 01/03/2013  . Thoracic spine pain 12/29/2012  . Vitamin D deficiency 12/08/2012  . Exercise intolerance 11/26/2011  . Essential hypertension 11/26/2011  . Migraine headache   . 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 Anemia 07/03/2011  . Morbid obesity (HCC) 12/03/2007    Past Medical History:  Diagnosis Date  . Chronic back pain   . Degenerative joint disease    Left TKA in 07/2009-Dr. Landau  . Depression 2001   doesn't take any meds  . Headache(784.0)    occasionally  . Hypothyroidism    was on Levothyroxine-has been off x 4 months  . Joint pain   . Joint swelling   . Morbid obesity (HCC)   . Nocturia   . Osteoarthritis of right knee 07/13/2013  . Peripheral edema    to right leg;takes Furosemide occasionally  and hasnt had one in a month  . Syncope    with palpitations; evaluated by [facility] in 2008 with normal echo    Family History  Problem Relation Age of Onset  . Diabetes Mother   . Depression Mother   . Diabetes Father        emphsema   . Hyperlipidemia Father   . Emphysema  Father   . Rheum arthritis Sister   . Healthy Daughter   . Healthy Daughter   . Healthy Daughter   . Colon cancer Neg Hx    Past Surgical History:  Procedure Laterality Date  . ABDOMINAL HYSTERECTOMY  1995    no oophorectomy  . CARPAL TUNNEL RELEASE  1991   Bilateral  . CESAREAN SECTION     X2  . CHOLECYSTECTOMY    . CHOLECYSTECTOMY, LAPAROSCOPIC  2007   Dr. Arnoldo Morale  . COLONOSCOPY N/A 03/03/2013   Procedure: COLONOSCOPY;  Surgeon: Rogene Houston, MD;  Location: [facility];  Service: Endoscopy;  Laterality: N/A;  930  . CRANIECTOMY SUBOCCIPITAL W/ CERVICAL LAMINECTOMY / CHIARI  2007   Repair of Arnold-Chiari malformation  . EYE SURGERY     bilateral cataract surgery  . JOINT REPLACEMENT Left 2011  . KNEE ARTHROSCOPY  1995   Dr. Durward Fortes   . THYROIDECTOMY, PARTIAL  2004   Left lobectomy - large benign nodule  . TONSILLECTOMY    . TOTAL KNEE ARTHROPLASTY  07/2009   Left knee , Dr. Mardelle Matte   . TOTAL KNEE ARTHROPLASTY Right 07/13/2013   Procedure: TOTAL KNEE ARTHROPLASTY;  Surgeon: Johnny Bridge, MD;  Location: [place];  Service: Orthopedics;  Laterality: Right;   Social History   Social History Narrative   Is in a new relationship    Immunization History  Administered Date(s) Administered  . Influenza Split 01/16/2012  . Influenza Whole 02/23/2007, 12/07/2008  . Influenza,inj,Quad PF,6+ Mos 12/07/2012, 02/15/2014, 11/28/2014, 01/09/2016, 04/07/2017, 12/18/2017, 02/16/2019  . Pneumococcal Polysaccharide-23 07/31/2015  . Td 10/23/2005     Objective: Vital Signs: BP 130/81 (BP Location: Left Wrist, Patient Position: Sitting, Cuff Size: Normal)   Pulse 60   Resp 17   Ht 5\' 2"  (1.575 m)   Wt (!) 335 lb 9.6 oz (152.2 kg)   BMI 61.38 kg/m    Physical Exam Vitals and nursing note reviewed.  Constitutional:      Appearance: [name] is well-developed.  HENT:     Head: Normocephalic and atraumatic.  Eyes:     Conjunctiva/sclera: Conjunctivae normal.  Pulmonary:      Effort: Pulmonary effort is normal.  Abdominal:     General: Bowel sounds are normal.     Palpations: Abdomen is soft.  Musculoskeletal:     Cervical back: Normal range of motion.  Lymphadenopathy:     Cervical: No cervical adenopathy.  Skin:    General: Skin is warm and dry.     Capillary Refill: Capillary refill takes less than 2 seconds.  Neurological:     Mental Status: [name] is alert and oriented to person, place, and time.  Psychiatric:        Behavior: Behavior normal.      Musculoskeletal Exam: C-spine limited range of motion with lateral rotation.  Thoracic and lumbar spine good range of motion.  Shoulder joints, elbow joints, wrist joints, MCPs, PIPs, DIPs have good range of motion.  [name] has extensor tenosynovitis of the right wrist.  Tenderness and synovitis of the right third MCP joint.  [name] has complete fist formation bilaterally.  Hip joints have good range of motion with no discomfort.  Bilateral knee replacements have good range of motion with no warmth or effusion.  Ankle joints have good range of motion with no tenderness or inflammation.  Right second hammertoe noted.  CDAI Exam: CDAI Score: 5  Patient Global: 5 mm; Provider Global: 5 mm Swollen: 2 ; Tender: 2  Joint Exam 09/15/2019      Right  Left  Wrist  Swollen Tender     MCP 3  Swollen Tender        Investigation: No additional findings.  Imaging: No results found.  Recent Labs: Lab Results  Component Value Date   WBC 6.7 07/16/2019   HGB 11.8 07/16/2019   PLT 382 07/16/2019   NA 140 07/16/2019   K 4.6 07/16/2019   CL 104 07/16/2019   CO2 26 07/16/2019   GLUCOSE 103 (H) 07/16/2019   BUN 14 07/16/2019   CREATININE 0.68 07/16/2019   BILITOT 0.6 07/16/2019   ALKPHOS 81 08/14/2017   AST 13 07/16/2019   ALT 9 07/16/2019   PROT 7.1 07/16/2019   ALBUMIN 4.0 08/14/2017   CALCIUM 8.9 07/16/2019   GFRAA 113 07/16/2019   QFTBGOLDPLUS NEGATIVE 11/30/2018    Speciality Comments: No specialty  comments available.  Procedures:  No procedures performed Allergies: Aleve [naproxen sodium], B12 folate [cobalamin combinations], Penicillins, Adhesive [tape], and Latex   Assessment / Plan:     Visit Diagnoses: Rheumatoid arthritis of multiple sites with negative rheumatoid factor (HCC) - RF negative, anti-CCP negative, severe synovitis and radiographic changes with MCP narrowing and juxta-articular osteopenia: [name] has persistent extensor tenosynovitis of the right wrist.  Tenderness and synovitis of the right third MCP joint noted.  [name] continues to have intermittent pain and inflammation in both hands.  [name] experiences morning stiffness lasting about 1 hour.  [name] is on Orencia 125 mg subcutaneous injections once weekly, methotrexate 1.0 mL subcutaneous injections once weekly, and folic acid 2 mg by mouth daily.  [name] increased the dose of methotrexate from 0.8 to 1.0 mL after [name] last office visit on 06/02/2019 but has not noticed any improvement. [name] was initially started on Orencia on 05/05/19.  Different treatment options were discussed.  [name] is not a candidate for anti-TNF inhibitors due to [name] history of chronic diastolic heart failure.  We discussed the option of Rinvoq.  Indications side effects contraindications were discussed at length.  Patient willing to proceed with the medication.  Have advised [name] to cancel [name] next shipment of Orencia.  Medication counseling:  Baseline Immunosuppressant Therapy Labs TB GOLD Quantiferon TB Gold Latest Ref Rng & Units 11/30/2018  Quantiferon TB Gold Plus NEGATIVE NEGATIVE   Hepatitis Panel Hepatitis Latest Ref Rng & Units 11/30/2018  Hep B Surface Ag NON-REACTI NON-REACTIVE  Hep B IgM NON-REACTI NON-REACTIVE  Hep C Ab 0.0 - 0.9 s/co ratio -  Hep C Ab NON-REACTI NON-REACTIVE  Hep C Ab NON-REACTI NON-REACTIVE   HIV Lab Results  Component Value Date   HIV NON-REACTIVE 11/30/2018   HIV Non Reactive 11/25/2014   Immunoglobulins Immunoglobulin  Electrophoresis Latest Ref Rng & Units 11/30/2018  IgA  47 - 310 mg/dL 161  IgG 096 - 0,454 mg/dL 0,981(X)  IgM 50 - 914 mg/dL 782   SPEP Serum Protein Electrophoresis Latest Ref Rng & Units 07/16/2019  Total Protein 6.1 - 8.1 g/dL 7.1  Albumin 3.8 - 4.8 g/dL -  Alpha-1 0.2 - 0.3 g/dL -  Alpha-2 0.5 - 0.9 g/dL -  Beta Globulin 0.4 - 0.6 g/dL -  Beta 2 0.2 - 0.5 g/dL -  Gamma Globulin 0.8 - 1.7 g/dL -   G8TL Lab Results  Component Value Date   G6PDH 13.4 11/30/2018   TPMT No results found for: TPMT   Lab Results  Component Value Date   CHOL 147 04/14/2018   HDL 44 (L) 04/14/2018   LDLCALC 84 04/14/2018   TRIG 94 04/14/2018   CHOLHDL 3.3 04/14/2018     Chest Xray: November 25, 2014  Does patient have history of diverticulitis?  No  Counseled patient that Rinvoq is a JAK inhibitor indicated for Rheumatoid Arthritis.  Counseled patient on purpose, proper use, and adverse effects of Rinvoq.    Reviewed the most common adverse effects including infection, diarrhea, headaches.  Also reviewed rare adverse effects such as bowel injury and the need to contact us if they develop stomach pain during treatment. Counseled on the increase risk of venous thrombosis. Reviewed with patient that there is the possibility of an increased risk of malignancy but it is not well understood if this increased risk is due to the medication or the disease state.  Instructed patient that medication should be held for infection and prior to surgery.  Advised patient to avoid live vaccines. Recommend annual influenza, Pneumovax 23, Prevnar 13, and Shingrix as indicated.    Provided patient with medication education material and answered all questions.  Patient consented to Rinvoq.  Will upload into patient's chart.  Will apply through patient's insurance and update when we receive a response.  Patient dose will be 15 mg daily.  Prescription will be sent to pharmacy pending lab results and insurance  approval.    High risk medication use - Orencia 125 mg sq injections once weekly (05/05/19), Methotrexate 1.0 ml every 7 days (started in September 2020) and folic acid 1 mg 2 tablets daily.  CBC and CMP were drawn on 07/16/2019.  [name] will be due to update lab work at the end of July and every 3 months to monitor for drug toxicity.  Standing labs for CBC and CMP are in place.  TB gold was negative on 11/30/2018.  A future order for TB gold was placed today.- Plan: QuantiFERON-TB Gold Plus  Status post total bilateral knee replacement: Doing well.  [name] has good range of motion with no discomfort.  No warmth or effusion noted.  Primary osteoarthritis of both feet: [name] is not experiencing any discomfort in [name] feet at this time.  Ankle joints have good range of motion with no tenderness or inflammation.  DDD (degenerative disc disease), cervical: [name] has limited range of motion with lateral rotation.  Good flexion extension of the C-spine.  [name] has chronic neck stiffness but has not had any symptoms of radiculopathy recently.  Other medical conditions are listed as follows:  Vitamin D deficiency  Family history of rheumatoid arthritis  History of gastroesophageal reflux (GERD)  Chronic diastolic heart failure (HCC)  Essential hypertension  Prediabetes  S/P partial thyroidectomy  History of depression  Hx of migraines  Orders: Orders Placed This Encounter  Procedures  . QuantiFERON-TB Gold Plus   No orders of the defined types were placed in this encounter.     Follow-Up Instructions: Return in about 6 weeks (around 10/27/2019) for Rheumatoid arthritis.   Gearldine Bienenstock, PA-C   I examined and evaluated the patient with Sherron Ales PA.  Patient continues to have synovitis and  tenosynovitis.  [name] has an adequate response to [place].  Different treatment options and their side effects were discussed.  [name] is in agreement to go with Rinvoq.  Have advised [name] to get Shingrix vaccine  with [name] PCP.  Have advised [name] to cancel [name] shipment for Orencia.  We will apply for patient assistance for Rinvoq.  Once approved we will start [name] on the medication.  The plan of care was discussed as noted above.  [name], MD  Note - This record has been created using Animal nutritionist.  Chart creation errors have been sought, but may not always  have been located. Such creation errors do not reflect on  the standard of medical care.

## 2019-09-15 ENCOUNTER — Telehealth: Payer: Self-pay | Admitting: Pharmacy Technician

## 2019-09-15 ENCOUNTER — Ambulatory Visit (INDEPENDENT_AMBULATORY_CARE_PROVIDER_SITE_OTHER): Payer: Medicare HMO | Admitting: Physician Assistant

## 2019-09-15 ENCOUNTER — Encounter: Payer: Self-pay | Admitting: Physician Assistant

## 2019-09-15 ENCOUNTER — Telehealth: Payer: Self-pay | Admitting: Rheumatology

## 2019-09-15 ENCOUNTER — Other Ambulatory Visit: Payer: Self-pay

## 2019-09-15 VITALS — BP 130/81 | HR 60 | Resp 17 | Ht 62.0 in | Wt 335.6 lb

## 2019-09-15 DIAGNOSIS — E890 Postprocedural hypothyroidism: Secondary | ICD-10-CM

## 2019-09-15 DIAGNOSIS — Z8261 Family history of arthritis: Secondary | ICD-10-CM | POA: Diagnosis not present

## 2019-09-15 DIAGNOSIS — M0609 Rheumatoid arthritis without rheumatoid factor, multiple sites: Secondary | ICD-10-CM | POA: Diagnosis not present

## 2019-09-15 DIAGNOSIS — E559 Vitamin D deficiency, unspecified: Secondary | ICD-10-CM

## 2019-09-15 DIAGNOSIS — Z9889 Other specified postprocedural states: Secondary | ICD-10-CM

## 2019-09-15 DIAGNOSIS — I10 Essential (primary) hypertension: Secondary | ICD-10-CM | POA: Diagnosis not present

## 2019-09-15 DIAGNOSIS — M5030 Other cervical disc degeneration, unspecified cervical region: Secondary | ICD-10-CM

## 2019-09-15 DIAGNOSIS — Z79899 Other long term (current) drug therapy: Secondary | ICD-10-CM | POA: Diagnosis not present

## 2019-09-15 DIAGNOSIS — M19071 Primary osteoarthritis, right ankle and foot: Secondary | ICD-10-CM | POA: Diagnosis not present

## 2019-09-15 DIAGNOSIS — I5032 Chronic diastolic (congestive) heart failure: Secondary | ICD-10-CM | POA: Diagnosis not present

## 2019-09-15 DIAGNOSIS — R7303 Prediabetes: Secondary | ICD-10-CM

## 2019-09-15 DIAGNOSIS — Z8669 Personal history of other diseases of the nervous system and sense organs: Secondary | ICD-10-CM

## 2019-09-15 DIAGNOSIS — Z96653 Presence of artificial knee joint, bilateral: Secondary | ICD-10-CM

## 2019-09-15 DIAGNOSIS — M19072 Primary osteoarthritis, left ankle and foot: Secondary | ICD-10-CM

## 2019-09-15 DIAGNOSIS — Z8719 Personal history of other diseases of the digestive system: Secondary | ICD-10-CM | POA: Diagnosis not present

## 2019-09-15 DIAGNOSIS — Z8659 Personal history of other mental and behavioral disorders: Secondary | ICD-10-CM

## 2019-09-15 NOTE — Telephone Encounter (Signed)
Patient called stating she just received a voicemail from [facility] stating her prescription has been approved.  Patient requested a return call.

## 2019-09-15 NOTE — Telephone Encounter (Signed)
Submitted a Prior Authorization request to [employer] for [facility] via Cover My Meds. Will update once we receive a response.   (KeyArcelia Jew) - [id number]

## 2019-09-15 NOTE — Telephone Encounter (Signed)
-----   Message from Audrie Lia, RT sent at 09/15/2019  3:07 PM EDT ----- Regarding: RINVOQ Please has been consented on Rinvoq. She states she has patient assistance for Orencia and will need patient assistance for Rinvoq as well. Please apply. Thank you.

## 2019-09-15 NOTE — Patient Instructions (Addendum)
Upadacitinib extended-release tablets What is this medicine? UPADACITINIB (ue PAD a SYE ti nib) is a medicine that works on the immune system. This medicine is used to treat rheumatoid arthritis. This medicine may be used for other purposes; ask your health care provider or pharmacist if you have questions. COMMON BRAND NAME(S): RINVOQ What should I tell my health care provider before I take this medicine? They need to know if you have any of these conditions:  cancer  diabetes  high cholesterol  HIV or AIDs  immune system problems  infection (especially a virus infection such as hepatitis B, chickenpox, cold sores, or herpes)  liver disease  low blood counts, like low white cell, platelets, or red cell counts  history of blood clots  lung or breathing disease, like asthma  organ transplant  stomach or intestine problems  tuberculosis, a positive skin test for tuberculosis, or have recently been in close contact with someone who has tuberculosis  an unusual or allergic reaction to upadacitinib, other medicines, foods, dyes or preservatives  pregnant or trying to get pregnant  breast-feeding How should I use this medicine? Take this medicine by mouth with a full glass of water. Follow the directions on the prescription label. Do not cut, crush, or chew this medicine. Swallow the tablets whole. You can take it with or without food. Take your medicine at regular intervals. Do not take more often then directed. Do not stop taking except on your doctor's advice. A special MedGuide will be given to you by the pharmacist with each prescription and refill. Be sure to read this information carefully each time. Talk to your pediatrician regarding the use of this medicine in children. Special care may be needed. Overdosage: If you think you have taken too much of this medicine contact a poison control center or emergency room at once. NOTE: This medicine is only for you. Do not share  this medicine with others. What if I miss a dose? If you miss a dose, take it as soon as you can. If it is almost time for your next dose, take only that dose. Do not take double or extra doses. What may interact with this medicine? Do not take this medicine with any of the following medications:  baricitinib  tofacitinib This medicine may also interact with the following medications:  azathioprine, cyclosporine, or other immunosuppressive drugs  biologic medicines such as abatacept, adalimumab, anakinra, certolizumab, etanercept, golimumab, infliximab, rituximab, secukinumab, tocilizumab, ustekinumab  certain medicines for fungal infections like ketoconazole, itraconazole, or posaconazole  certain medicines for seizures like carbamazepine, phenobarbital, phenytoin  clarithromycin  live vaccines  rifampin  supplements, such as St. John's wort This list may not describe all possible interactions. Give your health care provider a list of all the medicines, herbs, non-prescription drugs, or dietary supplements you use. Also tell them if you smoke, drink alcohol, or use illegal drugs. Some items may interact with your medicine. What should I watch for while using this medicine? Visit your healthcare professional for regular checks on your progress. Tell your healthcare professional if your symptoms do not start to get better or if they get worse. You may need blood work done while you are taking this medicine. Avoid taking products that contain aspirin, acetaminophen, ibuprofen, naproxen, or ketoprofen unless instructed by your doctor. These medicines may hide a fever. Call your doctor or healthcare professional for advice if you get a fever, chills or sore throat or other symptoms of a cold or flu. Do  not treat yourself. This drug decreases your body's ability to fight infections. Try to avoid being around people who are sick. Do not become pregnant while taking this medicine. Women  should inform their healthcare professional if they wish to become pregnant or think they might be pregnant. There is potential for serious side effects and harm to an unborn child. Talk to your healthcare professional for more information. Do not breast-feed an infant while taking this medicine or for 6 days after stopping it. What side effects may I notice from receiving this medicine? Side effects that you should report to your doctor or health care professional as soon as possible:  allergic reactions like skin rash, itching or hives, swelling of the face, lips, or tongue  breathing problems  signs and symptoms of a blood clot such as breathing problems; changes in vision; chest pain; severe, sudden headache; pain, swelling, warmth in the leg; trouble speaking; sudden numbness or weakness of the face, arm, or leg  sign and symptoms of infection like fever or chills; cough; sore throat; pain or trouble passing urine  signs and symptoms of liver injury like dark yellow or brown urine; general ill feeling or flu-like symptoms; light-colored stools; loss of appetite; nausea; right upper belly pain; unusually weak or tired; yellowing of the eyes or skin  stomach pain or a sudden change in bowel habits  unusually weak or tired Side effects that usually do not require medical attention (report these to your doctor or health care professional if they continue or are bothersome):  nausea  runny nose  sinus trouble This list may not describe all possible side effects. Call your doctor for medical advice about side effects. You may report side effects to FDA at 1-800-FDA-1088. Where should I keep my medicine? Keep out of the reach of children. Store between 2 and 25 degrees C (36 and 77 degrees F). Keep this medicine in the original container. Throw away any unused medicine after the expiration date. NOTE: This sheet is a summary. It may not cover all possible information. If you have questions  about this medicine, talk to your doctor, pharmacist, or health care provider.  2020 Elsevier/Gold Standard (2017-11-05 01:45:07) Standing Labs We placed an order today for your standing lab work.   Please have your standing labs drawn around October 15, 2019 and then every 3 months.  If possible, please have your labs drawn 2 weeks prior to your appointment so that the provider can discuss your results at your appointment.  We have open lab daily Monday through Thursday from 8:30-12:30 PM and 1:30-4:30 PM and Friday from 8:30-12:30 PM and 1:30-4:00 PM at the office of Dr. [name], [facility].   You may experience shorter wait times on Monday and Friday afternoons. The office is located at [address], [address], [place], [place] [address] No appointment is necessary.   Labs are drawn by [employer].  You may receive a bill from [facility] for your lab work.  If you wish to have your labs drawn at another location, please call the office 24 hours in advance to send orders.  If you have any questions regarding directions or hours of operation,  please call [phone].   As a reminder, please drink plenty of water prior to coming for your lab work. Thanks!

## 2019-09-16 NOTE — Telephone Encounter (Signed)
Patient advised she is approved for Rinvoq but that her co pay is $1,762.07 for a one month supply. Patient advised we will apply for patient assistance. Patient states she will have her daughter stop by the office to pick it up the application. Left application at the front for pick up.

## 2019-09-16 NOTE — Telephone Encounter (Signed)
Received notification from [facility] regarding a prior authorization for [facility]. Authorization has been APPROVED from 03/19/19 to 03/17/20.   Authorization # [id number]  Ran test claim for 1 month supply, patient's copay is $1,762.07. Will mail patient [employer] PAP application.

## 2019-09-21 ENCOUNTER — Ambulatory Visit: Payer: Medicare HMO | Admitting: Family Medicine

## 2019-09-22 ENCOUNTER — Other Ambulatory Visit (HOSPITAL_COMMUNITY): Payer: Self-pay | Admitting: Family Medicine

## 2019-09-22 ENCOUNTER — Telehealth: Payer: Self-pay | Admitting: Family Medicine

## 2019-09-22 ENCOUNTER — Ambulatory Visit: Payer: Medicare HMO | Admitting: Family Medicine

## 2019-09-22 ENCOUNTER — Other Ambulatory Visit: Payer: Self-pay

## 2019-09-22 DIAGNOSIS — Z1231 Encounter for screening mammogram for malignant neoplasm of breast: Secondary | ICD-10-CM

## 2019-09-22 DIAGNOSIS — R928 Other abnormal and inconclusive findings on diagnostic imaging of breast: Secondary | ICD-10-CM

## 2019-09-22 NOTE — Telephone Encounter (Signed)
Order placed.  Patient aware.  

## 2019-09-22 NOTE — Telephone Encounter (Signed)
Pt needs and order for Mammo.

## 2019-09-27 ENCOUNTER — Telehealth: Payer: Self-pay | Admitting: Rheumatology

## 2019-09-27 ENCOUNTER — Other Ambulatory Visit: Payer: Self-pay

## 2019-09-27 DIAGNOSIS — M5430 Sciatica, unspecified side: Secondary | ICD-10-CM

## 2019-09-27 DIAGNOSIS — M549 Dorsalgia, unspecified: Secondary | ICD-10-CM

## 2019-09-27 DIAGNOSIS — M2550 Pain in unspecified joint: Secondary | ICD-10-CM

## 2019-09-27 MED ORDER — IBUPROFEN 800 MG PO TABS: ORAL_TABLET | ORAL | 0 refills | Status: DC

## 2019-09-27 NOTE — Telephone Encounter (Signed)
Patient called stating she dropped off her application for patient assistance last week and is checking on the status.

## 2019-09-28 NOTE — Telephone Encounter (Signed)
Submitted Patient Assistance Application on 09/23/19 to [employer] for Rinvoq along with provider portion, PA and income documents. Will update patient when we receive a response.   Verlin Fester, PharmD, [place], CPP Clinical Specialty Pharmacist (Rheumatology and Pulmonology)  09/28/2019 8:43 AM

## 2019-09-30 ENCOUNTER — Telehealth: Payer: Self-pay | Admitting: Rheumatology

## 2019-09-30 NOTE — Telephone Encounter (Signed)
Patient called checking the status of her prescription of Rinvoq.  Patient requested a return call.

## 2019-09-30 NOTE — Telephone Encounter (Signed)
Called [employer] to check status of patient's Rinvoq application. Rep states they are missing proof of income for household. Called patient, left voicemail with phone number for [employer].  Phone# [phone]

## 2019-10-05 ENCOUNTER — Ambulatory Visit: Payer: Medicare HMO | Admitting: Family Medicine

## 2019-10-07 NOTE — Telephone Encounter (Signed)
Patient dropped off income documents.  Faxed to [employer] and will update when we receive a response.

## 2019-10-12 ENCOUNTER — Ambulatory Visit (HOSPITAL_COMMUNITY)
Admission: RE | Admit: 2019-10-12 | Discharge: 2019-10-12 | Disposition: A | Discharge status: Home / Self Care | Payer: Medicare HMO | Source: Ambulatory Visit | Attending: Family Medicine | Admitting: Family Medicine

## 2019-10-12 ENCOUNTER — Ambulatory Visit (INDEPENDENT_AMBULATORY_CARE_PROVIDER_SITE_OTHER): Payer: Medicare HMO | Admitting: Family Medicine

## 2019-10-12 ENCOUNTER — Encounter: Payer: Self-pay | Admitting: Family Medicine

## 2019-10-12 ENCOUNTER — Other Ambulatory Visit: Payer: Self-pay

## 2019-10-12 VITALS — BP 142/81 | HR 62 | Resp 16 | Ht 62.0 in | Wt 334.1 lb

## 2019-10-12 DIAGNOSIS — M5430 Sciatica, unspecified side: Secondary | ICD-10-CM | POA: Diagnosis not present

## 2019-10-12 DIAGNOSIS — M7751 Other enthesopathy of right foot: Secondary | ICD-10-CM | POA: Diagnosis not present

## 2019-10-12 DIAGNOSIS — M549 Dorsalgia, unspecified: Secondary | ICD-10-CM | POA: Diagnosis not present

## 2019-10-12 DIAGNOSIS — R928 Other abnormal and inconclusive findings on diagnostic imaging of breast: Secondary | ICD-10-CM

## 2019-10-12 DIAGNOSIS — Z13220 Encounter for screening for lipoid disorders: Secondary | ICD-10-CM

## 2019-10-12 DIAGNOSIS — E1149 Type 2 diabetes mellitus with other diabetic neurological complication: Secondary | ICD-10-CM | POA: Diagnosis not present

## 2019-10-12 DIAGNOSIS — E559 Vitamin D deficiency, unspecified: Secondary | ICD-10-CM | POA: Diagnosis not present

## 2019-10-12 DIAGNOSIS — F3342 Major depressive disorder, recurrent, in full remission: Secondary | ICD-10-CM

## 2019-10-12 DIAGNOSIS — I10 Essential (primary) hypertension: Secondary | ICD-10-CM

## 2019-10-12 DIAGNOSIS — R69 Illness, unspecified: Secondary | ICD-10-CM | POA: Diagnosis not present

## 2019-10-12 LAB — POCT GLYCOSYLATED HEMOGLOBIN (HGB A1C): Hemoglobin A1C: 5.8 % — AB (ref 4.0–5.6)

## 2019-10-12 MED ORDER — ROSUVASTATIN CALCIUM 5 MG PO TABS
5.0000 mg | ORAL_TABLET | Freq: Every day | ORAL | 3 refills | Status: DC
Start: 2019-10-12 — End: 2019-12-09

## 2019-10-12 MED ORDER — GABAPENTIN 300 MG PO CAPS: 300.0000 mg | ORAL_CAPSULE | Freq: Every day | ORAL | 0 refills | Status: DC

## 2019-10-12 MED ORDER — BENAZEPRIL HCL 10 MG PO TABS: 10.0000 mg | ORAL_TABLET | Freq: Every day | ORAL | 3 refills | Status: DC

## 2019-10-12 MED ORDER — PHENTERMINE HCL 37.5 MG PO TABS: 37.5000 mg | ORAL_TABLET | Freq: Every day | ORAL | 1 refills | Status: DC

## 2019-10-12 NOTE — Telephone Encounter (Signed)
Received a fax from  [employer] regarding an approval for [employer] from 10/11/2019 to 03/17/2021.    Verlin Fester, PharmD, [place], CPP Clinical Specialty Pharmacist (Rheumatology and Pulmonology)  10/12/2019 8:38 AM

## 2019-10-12 NOTE — Assessment & Plan Note (Addendum)
Injured 6 month ago since then intermittent sharp  pains and and has fallen twice, refer to ortho

## 2019-10-12 NOTE — Progress Notes (Signed)
[name]     MRN: [id number]      DOB: Jul 22, 1960   HPI [name] is here for follow up and re-evaluation of chronic medical conditions, medication management and review of any available recent lab and radiology data.  Preventive health is updated, specifically  Cancer screening and Immunization.   Questions or concerns regarding consultations or procedures which the PT has had in the interim are  addressed. The PT denies any adverse reactions to current medications since the last visit.  6 month h/o posterior right ankle pain, injured it when walking at times she has to hop and states she wore a boot "they" gave her  (not medical)  C/o disabling back pain radiating down right lehg  ROS Denies recent fever or chills. Denies sinus pressure, nasal congestion, ear pain or sore throat. Denies chest congestion, productive cough or wheezing. Denies chest pains, palpitations and leg swelling Denies abdominal pain, nausea, vomiting,diarrhea or constipation.   Denies dysuria, frequency, hesitancy .  Denies headaches, seizures, numbness, or tingling. Denies uncontrolled depression, anxiety or insomnia. Denies skin break down or rash.   PE  BP (!) 142/81   Pulse 62   Resp 16   Ht 5\' 2"  (1.575 m)   Wt (!) 334 lb 1.3 oz (151.5 kg)   SpO2 99%   BMI 61.10 kg/m   Patient alert and oriented and in no cardiopulmonary distress.  HEENT: No facial asymmetry, EOMI,     Neck supple .  Chest: Clear to auscultation bilaterally.  CVS: S1, S2 no murmurs, no S3.Regular rate.  ABD: Soft non tender.   Ext: No edema  MS: decreased  ROM spine, adequate in shoulders, hips and knees.Tender over right Achilles tendon  Skin: Intact, no ulcerations or rash noted.  Psych: Good eye contact, normal affect. Memory intact not anxious or depressed appearing.  CNS: CN 2-12 intact, power,  normal throughout.no focal deficits noted.   Assessment & Plan  Tendinitis of right  ankle Injured 6 month ago since then intermittent sharp  pains and and has fallen twice, refer to ortho  Essential hypertension ujncontrolled , add benazepril daily DASH diet and commitment to daily physical activity for a minimum of 30 minutes discussed and encouraged, as a part of hypertension management. The importance of attaining a healthy weight is also discussed.  BP/Weight 10/12/2019 09/15/2019 06/24/2019 06/16/2019 06/02/2019 05/05/2019 04/08/2019  Systolic BP 142 130 132 132 134 120 128  Diastolic BP 81 81 68 68 81 93 64  Wt. (Lbs) 334.08 335.6 312 328 327 321 331  BMI 61.1 61.38 57.07 59.99 59.81 58.71 60.54  Some encounter information is confidential and restricted. Go to Review Flowsheets activity to see all data.       Type 2 diabetes mellitus with neurological complications (HCC) Controlled, no change in medication [name] is reminded of the importance of commitment to daily physical activity for 30 minutes or more, as able and the need to limit carbohydrate intake to 30 to 60 grams per meal to help with blood sugar control.   The need to take medication as prescribed, test blood sugar as directed, and to call between visits if there is a concern that blood sugar is uncontrolled is also discussed.   [name] is reminded of the importance of daily foot exam, annual eye examination, and good blood sugar, blood pressure and cholesterol control.  Diabetic Labs Latest Ref Rng & Units 10/13/2019 10/12/2019 07/16/2019 06/24/2019 06/02/2019  HbA1c 4.0 -  5.6 % - 5.8(A) - - -  Microalbumin Not Estab. ug/mL 7.2(H) - - - -  Micro/Creat Ratio <30 mcg/mg creat - - - - -  Chol <200 mg/dL - - - - -  HDL >30 mg/dL - - - - -  Calc LDL mg/dL (calc) - - - - -  Triglycerides <150 mg/dL - - - - -  Creatinine 8.65 - 1.05 mg/dL - - 7.84 6.96 2.95   BP/Weight 10/12/2019 09/15/2019 06/24/2019 06/16/2019 06/02/2019 05/05/2019 04/08/2019  Systolic BP 142 130 132 132 134 120 128  Diastolic BP 81 81  68 68 81 93 64  Wt. (Lbs) 334.08 335.6 312 328 327 321 331  BMI 61.1 61.38 57.07 59.99 59.81 58.71 60.54  Some encounter information is confidential and restricted. Go to Review Flowsheets activity to see all data.   Foot/eye exam completion dates 10/12/2019 07/31/2015  Foot Form Completion Done Done        Back pain with sciatica Increased and uncontrolled, needs updated mRI , star bedtime gabapentin  Morbid obesity  Patient re-educated about  the importance of commitment to a  minimum of 150 minutes of exercise per week as able.  The importance of healthy food choices with portion control discussed, as well as eating regularly and within a 12 hour window most days. The need to choose "clean , green" food 50 to 75% of the time is discussed, as well as to make water the primary drink and set a goal of 64 ounces water daily.    Weight /BMI 10/12/2019 09/15/2019 06/24/2019  WEIGHT 334 lb 1.3 oz 335 lb 9.6 oz 312 lb  HEIGHT 5\' 2"  5\' 2"  5\' 2"   BMI 61.1 kg/m2 61.38 kg/m2 57.07 kg/m2  Some encounter information is confidential and restricted. Go to Review Flowsheets activity to see all data.      Recurrent major depressive disorder, in full remission (HCC) Managed by Psych and stable

## 2019-10-12 NOTE — Patient Instructions (Addendum)
Follow-up in office with MD in 7 weeks call if you need me sooner.  Microalb from office today and glycohb in office today Fasting lipid CMP and EGFR TSH and vitamin D 1 week before next visit.  New,  because you are a diabetic start aspirin 81 mg 1 daily to reduce heart disease risk . Also start Crestor 1 at bedtime this is a statin and not only lowers cholesterol but reduces the  risk of heart disease  3 .start benazepril 10 mg daily to help to protect your kidneys as well as single your blood pressure.  Continue phentermine 1 tablet once daily and please modify food choices and portion sizes to facilitate weight loss.  New for back pain radiating down the right leg is gabapentin 300 mg 1 at bedtime.  You are also being referred for an MRI of your low back. 1500 cal diet sheet given  Thanks for choosing [facility], we consider it a privelige to serve you.

## 2019-10-13 ENCOUNTER — Other Ambulatory Visit (HOSPITAL_COMMUNITY)
Admission: RE | Admit: 2019-10-13 | Discharge: 2019-10-13 | Disposition: A | Discharge status: Home / Self Care | Payer: Medicare HMO | Source: Other Acute Inpatient Hospital | Attending: Family Medicine | Admitting: Family Medicine

## 2019-10-13 ENCOUNTER — Telehealth: Payer: Self-pay | Admitting: Rheumatology

## 2019-10-13 DIAGNOSIS — E1149 Type 2 diabetes mellitus with other diabetic neurological complication: Secondary | ICD-10-CM | POA: Insufficient documentation

## 2019-10-13 NOTE — Telephone Encounter (Signed)
Called patient and advised of approval. Patient states her voicemail is not set up and she hasn't answered any 800 phone numbers. Provided her with  Program phone number to call to schedule 1st shipment.

## 2019-10-13 NOTE — Telephone Encounter (Signed)
Patient left a voicemail checking the status of her medication.

## 2019-10-14 LAB — MICROALBUMIN, URINE: Microalb, Ur: 7.2 ug/mL — ABNORMAL HIGH

## 2019-10-16 ENCOUNTER — Encounter: Payer: Self-pay | Admitting: Family Medicine

## 2019-10-16 DIAGNOSIS — E1149 Type 2 diabetes mellitus with other diabetic neurological complication: Secondary | ICD-10-CM | POA: Insufficient documentation

## 2019-10-16 NOTE — Assessment & Plan Note (Signed)
Managed by Psych and stable 

## 2019-10-16 NOTE — Assessment & Plan Note (Signed)
Increased and uncontrolled, needs updated mRI , star bedtime gabapentin

## 2019-10-16 NOTE — Assessment & Plan Note (Signed)
  Patient re-educated about  the importance of commitment to a  minimum of 150 minutes of exercise per week as able.  The importance of healthy food choices with portion control discussed, as well as eating regularly and within a 12 hour window most days. The need to choose "clean , green" food 50 to 75% of the time is discussed, as well as to make water the primary drink and set a goal of 64 ounces water daily.    Weight /BMI 10/12/2019 09/15/2019 06/24/2019  WEIGHT 334 lb 1.3 oz 335 lb 9.6 oz 312 lb  HEIGHT 5\' 2"  5\' 2"  5\' 2"   BMI 61.1 kg/m2 61.38 kg/m2 57.07 kg/m2  Some encounter information is confidential and restricted. Go to Review Flowsheets activity to see all data.

## 2019-10-16 NOTE — Assessment & Plan Note (Signed)
ujncontrolled , add benazepril daily DASH diet and commitment to daily physical activity for a minimum of 30 minutes discussed and encouraged, as a part of hypertension management. The importance of attaining a healthy weight is also discussed.  BP/Weight 10/12/2019 09/15/2019 06/24/2019 06/16/2019 06/02/2019 05/05/2019 04/08/2019  Systolic BP 142 130 132 132 134 120 128  Diastolic BP 81 81 68 68 81 93 64  Wt. (Lbs) 334.08 335.6 312 328 327 321 331  BMI 61.1 61.38 57.07 59.99 59.81 58.71 60.54  Some encounter information is confidential and restricted. Go to Review Flowsheets activity to see all data.

## 2019-10-16 NOTE — Assessment & Plan Note (Signed)
Controlled, no change in medication Ms. Boyington is reminded of the importance of commitment to daily physical activity for 30 minutes or more, as able and the need to limit carbohydrate intake to 30 to 60 grams per meal to help with blood sugar control.   The need to take medication as prescribed, test blood sugar as directed, and to call between visits if there is a concern that blood sugar is uncontrolled is also discussed.   Ms. Macmullen is reminded of the importance of daily foot exam, annual eye examination, and good blood sugar, blood pressure and cholesterol control.  Diabetic Labs Latest Ref Rng & Units 10/13/2019 10/12/2019 07/16/2019 06/24/2019 06/02/2019  HbA1c 4.0 - 5.6 % - 5.8(A) - - -  Microalbumin Not Estab. ug/mL 7.2(H) - - - -  Micro/Creat Ratio <30 mcg/mg creat - - - - -  Chol <200 mg/dL - - - - -  HDL >66 mg/dL - - - - -  Calc LDL mg/dL (calc) - - - - -  Triglycerides <150 mg/dL - - - - -  Creatinine 0.60 - 1.05 mg/dL - - 0.45 9.97 7.41   BP/Weight 10/12/2019 09/15/2019 06/24/2019 06/16/2019 06/02/2019 05/05/2019 04/08/2019  Systolic BP 142 130 132 132 134 120 128  Diastolic BP 81 81 68 68 81 93 64  Wt. (Lbs) 334.08 335.6 312 328 327 321 331  BMI 61.1 61.38 57.07 59.99 59.81 58.71 60.54  Some encounter information is confidential and restricted. Go to Review Flowsheets activity to see all data.   Foot/eye exam completion dates 10/12/2019 07/31/2015  Foot Form Completion Done Done

## 2019-10-18 ENCOUNTER — Encounter: Payer: Self-pay | Admitting: Family Medicine

## 2019-10-19 ENCOUNTER — Other Ambulatory Visit: Payer: Self-pay | Admitting: Rheumatology

## 2019-10-19 DIAGNOSIS — M0609 Rheumatoid arthritis without rheumatoid factor, multiple sites: Secondary | ICD-10-CM

## 2019-10-20 NOTE — Telephone Encounter (Signed)
Last Visit: 09/15/2019 Next Visit: 11/09/2019 Labs: 07/16/2019 MCV is low, all other labs are normal.   Current Dose per office note 09/15/2019: methotrexate 1.0 mL subcutaneous injections once weekly DX: Rheumatoid arthritis of multiple sites with negative rheumatoid factor   Patient advised she is due to update labs. Patient states she will try to update this week.   Okay to refill MTX?

## 2019-10-22 ENCOUNTER — Other Ambulatory Visit: Payer: Self-pay

## 2019-10-22 DIAGNOSIS — Z79899 Other long term (current) drug therapy: Secondary | ICD-10-CM | POA: Diagnosis not present

## 2019-10-23 NOTE — Progress Notes (Signed)
CBC and CMP are stable.

## 2019-10-24 LAB — COMPLETE METABOLIC PANEL WITH GFR
AG Ratio: 1.1 (calc) (ref 1.0–2.5)
ALT: 13 U/L (ref 6–29)
AST: 17 U/L (ref 10–35)
Albumin: 3.9 g/dL (ref 3.6–5.1)
Alkaline phosphatase (APISO): 83 U/L (ref 37–153)
BUN: 14 mg/dL (ref 7–25)
CO2: 25 mmol/L (ref 20–32)
Calcium: 9.3 mg/dL (ref 8.6–10.4)
Chloride: 104 mmol/L (ref 98–110)
Creat: 0.63 mg/dL (ref 0.50–1.05)
GFR, Est African American: 116 mL/min/{1.73_m2} (ref >=60)
GFR, Est Non African American: 100 mL/min/{1.73_m2} (ref >=60)
Globulin: 3.4 g/dL (calc) (ref 1.9–3.7)
Glucose, Bld: 95 mg/dL (ref 65–99)
Potassium: 4.2 mmol/L (ref 3.5–5.3)
Sodium: 140 mmol/L (ref 135–146)
Total Bilirubin: 0.8 mg/dL (ref 0.2–1.2)
Total Protein: 7.3 g/dL (ref 6.1–8.1)

## 2019-10-24 LAB — QUANTIFERON-TB GOLD PLUS
Mitogen-NIL: >10 IU/mL
NIL: 0.07 IU/mL
QuantiFERON-TB Gold Plus: NEGATIVE
TB1-NIL: 0.01 IU/mL
TB2-NIL: 0 IU/mL

## 2019-10-24 LAB — CBC WITH DIFFERENTIAL/PLATELET
Absolute Monocytes: 282 cells/uL (ref 200–950)
Basophils Absolute: 30 cells/uL (ref 0–200)
Basophils Relative: 0.5 %
Eosinophils Absolute: 120 cells/uL (ref 15–500)
Eosinophils Relative: 2 %
HCT: 38.8 % (ref 35.0–45.0)
Hemoglobin: 11.9 g/dL (ref 11.7–15.5)
Lymphs Abs: 2088 cells/uL (ref 850–3900)
MCH: 24.8 pg — ABNORMAL LOW (ref 27.0–33.0)
MCHC: 30.7 g/dL — ABNORMAL LOW (ref 32.0–36.0)
MCV: 81 fL (ref 80.0–100.0)
MPV: 10.1 fL (ref 7.5–12.5)
Monocytes Relative: 4.7 %
Neutro Abs: 3480 cells/uL (ref 1500–7800)
Neutrophils Relative %: 58 %
Platelets: 389 10*3/uL (ref 140–400)
RBC: 4.79 10*6/uL (ref 3.80–5.10)
RDW: 14.5 % (ref 11.0–15.0)
Total Lymphocyte: 34.8 %
WBC: 6 10*3/uL (ref 3.8–10.8)

## 2019-10-25 NOTE — Progress Notes (Signed)
TB gold negative

## 2019-10-26 ENCOUNTER — Ambulatory Visit (INDEPENDENT_AMBULATORY_CARE_PROVIDER_SITE_OTHER): Payer: Medicare HMO | Admitting: Orthopaedic Surgery

## 2019-10-26 ENCOUNTER — Encounter: Payer: Self-pay | Admitting: Orthopaedic Surgery

## 2019-10-26 ENCOUNTER — Other Ambulatory Visit: Payer: Self-pay

## 2019-10-26 ENCOUNTER — Ambulatory Visit: Payer: Medicare HMO

## 2019-10-26 VITALS — BP 129/81 | HR 60 | Ht 62.0 in | Wt 334.4 lb

## 2019-10-26 DIAGNOSIS — M25571 Pain in right ankle and joints of right foot: Secondary | ICD-10-CM

## 2019-10-26 DIAGNOSIS — S86011A Strain of right Achilles tendon, initial encounter: Secondary | ICD-10-CM | POA: Diagnosis not present

## 2019-10-26 DIAGNOSIS — Z6841 Body Mass Index (BMI) 40.0 and over, adult: Secondary | ICD-10-CM

## 2019-10-26 NOTE — Progress Notes (Signed)
Subjective:    Patient ID: [name], female    DOB: 1960-12-15, 59 y.o.   MRN: [id number]  HPI She stepped in a hole about six months ago and has had pain in posterior right ankle since then. A friend lent her a CAM walker which she has used for a while.  She still has pain.  She saw Dr. Lodema Hong recently and she asked that I see the patient.  She has no other episodes of pain.  She has some swelling, no redness, no color changes.  She has no numbness.  The ankle and heel hurt some and she cannot walk long distances.   Review of Systems  Constitutional: Positive for activity change.  Musculoskeletal: Positive for arthralgias, back pain, gait problem and joint swelling.  Neurological: Positive for headaches.  All other systems reviewed and are negative.  For Review of Systems, all other systems reviewed and are negative.  The following is a summary of the past history medically, past history surgically, known current medicines, social history and family history.  This information is gathered electronically by the computer from prior information and documentation.  I review this each visit and have found including this information at this point in the chart is beneficial and informative.   Past Medical History:  Diagnosis Date  . Chronic back pain   . Degenerative joint disease    Left TKA in 07/2009-Dr. Landau  . Depression 2001   doesn't take any meds  . Headache(784.0)    occasionally  . Hypothyroidism    was on Levothyroxine-has been off x 4 months  . Joint pain   . Joint swelling   . Morbid obesity (HCC)   . Nocturia   . Osteoarthritis of right knee 07/13/2013  . Peripheral edema    to right leg;takes Furosemide occasionally and hasnt had one in a month  . Syncope    with palpitations; evaluated by [facility] in 2008 with normal echo    Past Surgical History:  Procedure Laterality Date  . ABDOMINAL HYSTERECTOMY  1995    no oophorectomy  .  CARPAL TUNNEL RELEASE  1991   Bilateral  . CESAREAN SECTION     X2  . CHOLECYSTECTOMY    . CHOLECYSTECTOMY, LAPAROSCOPIC  2007   Dr. Lovell Sheehan  . COLONOSCOPY N/A 03/03/2013   Procedure: COLONOSCOPY;  Surgeon: Malissa Hippo, MD;  Location: [facility]ITE;  Service: Endoscopy;  Laterality: N/A;  930  . CRANIECTOMY SUBOCCIPITAL W/ CERVICAL LAMINECTOMY / CHIARI  2007   Repair of Arnold-Chiari malformation  . EYE SURGERY     bilateral cataract surgery  . JOINT REPLACEMENT Left 2011  . KNEE ARTHROSCOPY  1995   Dr. Cleophas Dunker   . THYROIDECTOMY, PARTIAL  2004   Left lobectomy - large benign nodule  . TONSILLECTOMY    . TOTAL KNEE ARTHROPLASTY  07/2009   Left knee , Dr. Dion Saucier   . TOTAL KNEE ARTHROPLASTY Right 07/13/2013   Procedure: TOTAL KNEE ARTHROPLASTY;  Surgeon: Eulas Post, MD;  Location: [facility] OR;  Service: Orthopedics;  Laterality: Right;    Current Outpatient Medications on File Prior to Visit  Medication Sig Dispense Refill  . benazepril (LOTENSIN) 10 MG tablet Take 1 tablet (10 mg total) by mouth daily. 90 tablet 3  . folic acid (FOLVITE) 1 MG tablet Take 2 tablets (2 mg total) by mouth daily. 180 tablet 3  . gabapentin (NEURONTIN) 300 MG capsule Take 1 capsule (300 mg  total) by mouth at bedtime. 90 capsule 0  . hydrochlorothiazide (HYDRODIURIL) 25 MG tablet Take 1 tablet (25 mg total) by mouth daily. 90 tablet 3  . ibuprofen (ADVIL) 800 MG tablet Take 1 tablet by mouth twice weekly as needed for knee or back pain 30 tablet 0  . metFORMIN (GLUCOPHAGE) 500 MG tablet Take 1 tablet (500 mg total) by mouth daily. 90 tablet 3  . Methotrexate Sodium (METHOTREXATE, PF,) 50 MG/2ML injection INJECT 1 ML UNDER THE SKIN WEEKLY 12 mL 0  . phentermine (ADIPEX-P) 37.5 MG tablet Take 1 tablet (37.5 mg total) by mouth daily before breakfast. 30 tablet 1  . rosuvastatin (CRESTOR) 5 MG tablet Take 1 tablet (5 mg total) by mouth daily. 90 tablet 3  . sertraline (ZOLOFT) 100 MG tablet Take 1.5  tablets (150 mg total) by mouth daily. 135 tablet 1  . TUBERCULIN SYR 1CC/27GX1/2" (B-D TB SYRINGE 1CC/27GX1/2") 27G X 1/2" 1 ML MISC 12 Syringes by Does not apply route once a week. 12 each 3   No current facility-administered medications on file prior to visit.    Social History   Socioeconomic History  . Marital status: Significant Other    Spouse name: Not on file  . Number of children: 3  . Years of education: 66  . Highest education level: 12th grade  Occupational History  . Occupation: Employed seeking disabilty 2012  Tobacco Use  . Smoking status: Never Smoker  . Smokeless tobacco: Never Used  Vaping Use  . Vaping Use: Never used  Substance and Sexual Activity  . Alcohol use: Yes    Alcohol/week: 0.0 standard drinks    Comment: rarely  . Drug use: No  . Sexual activity: Yes    Birth control/protection: Surgical  Other Topics Concern  . Not on file  Social History Narrative   Is in a new relationship    Social Determinants of Health   Financial Resource Strain:   . Difficulty of Paying Living Expenses:   Food Insecurity:   . Worried About Programme researcher, broadcasting/film/video in the Last Year:   . Barista in the Last Year:   Transportation Needs:   . Freight forwarder (Medical):   Marland Kitchen Lack of Transportation (Non-Medical):   Physical Activity:   . Days of Exercise per Week:   . Minutes of Exercise per Session:   Stress:   . Feeling of Stress :   Social Connections:   . Frequency of Communication with Friends and Family:   . Frequency of Social Gatherings with Friends and Family:   . Attends Religious Services:   . Active Member of Clubs or Organizations:   . Attends Banker Meetings:   Marland Kitchen Marital Status:   Intimate Partner Violence:   . Fear of Current or Ex-Partner:   . Emotionally Abused:   Marland Kitchen Physically Abused:   . Sexually Abused:     Family History  Problem Relation Age of Onset  . Diabetes Mother   . Depression Mother   . Diabetes  Father        emphsema   . Hyperlipidemia Father   . Emphysema Father   . Rheum arthritis Sister   . Healthy Daughter   . Healthy Daughter   . Healthy Daughter   . Colon cancer Neg Hx     There were no vitals taken for this visit.  There is no height or weight on file to calculate BMI.  Objective:   Physical Exam Vitals and nursing note reviewed.  Constitutional:      Appearance: She is well-developed.  HENT:     Head: Normocephalic and atraumatic.  Eyes:     Conjunctiva/sclera: Conjunctivae normal.     Pupils: Pupils are equal, round, and reactive to light.  Cardiovascular:     Rate and Rhythm: Normal rate and regular rhythm.  Pulmonary:     Effort: Pulmonary effort is normal.  Abdominal:     Palpations: Abdomen is soft.  Musculoskeletal:     Cervical back: Normal range of motion and neck supple.       Feet:  Skin:    General: Skin is warm and dry.  Neurological:     Mental Status: She is alert and oriented to person, place, and time.     Cranial Nerves: No cranial nerve deficit.     Motor: No abnormal muscle tone.     Coordination: Coordination normal.     Deep Tendon Reflexes: Reflexes are normal and symmetric. Reflexes normal.  Psychiatric:        Behavior: Behavior normal.        Thought Content: Thought content normal.        Judgment: Judgment normal.    X-rays were done of the right ankle, reported separately.       Assessment & Plan:   Encounter Diagnoses  Name Primary?  . Pain in right ankle and joints of right foot Yes  . Partial Achilles tendon tear, right, initial encounter   . Body mass index 40.0-44.9, adult (HCC)   . Morbid obesity (HCC)    She needs MRI of the right ankle to see if there is a full tear or partial tear of the right achilles tendon.  I will see in one week.  I have given new CAM walker.  Call if any problem.  Precautions discussed.   Electronically Signed Darreld Mclean, MD 8/10/20213:32 PM

## 2019-10-27 ENCOUNTER — Other Ambulatory Visit: Payer: Self-pay | Admitting: Orthopaedic Surgery

## 2019-10-27 DIAGNOSIS — N951 Menopausal and female climacteric states: Secondary | ICD-10-CM | POA: Diagnosis not present

## 2019-10-27 DIAGNOSIS — I10 Essential (primary) hypertension: Secondary | ICD-10-CM | POA: Diagnosis not present

## 2019-10-27 DIAGNOSIS — M25571 Pain in right ankle and joints of right foot: Secondary | ICD-10-CM

## 2019-10-27 DIAGNOSIS — R5383 Other fatigue: Secondary | ICD-10-CM | POA: Diagnosis not present

## 2019-10-27 DIAGNOSIS — S86011A Strain of right Achilles tendon, initial encounter: Secondary | ICD-10-CM

## 2019-10-27 DIAGNOSIS — R635 Abnormal weight gain: Secondary | ICD-10-CM | POA: Diagnosis not present

## 2019-10-27 NOTE — Progress Notes (Deleted)
Office Visit Note  Patient: [name]             Date of Birth: 01/21/1961           MRN: [id number]             PCP: Kerri Perches, MD Referring: Kerri Perches, MD Visit Date: 11/09/2019 Occupation: @GUAROCC @  Subjective:  No chief complaint on file.   History of Present Illness: [name] is a 59 y.o. female ***   Activities of Daily Living:  Patient reports morning stiffness for *** {minute/hour:19697}.   Patient {ACTIONS;DENIES/REPORTS:21021675::"Denies"} nocturnal pain.  Difficulty dressing/grooming: {ACTIONS;DENIES/REPORTS:21021675::"Denies"} Difficulty climbing stairs: {ACTIONS;DENIES/REPORTS:21021675::"Denies"} Difficulty getting out of chair: {ACTIONS;DENIES/REPORTS:21021675::"Denies"} Difficulty using hands for taps, buttons, cutlery, and/or writing: {ACTIONS;DENIES/REPORTS:21021675::"Denies"}  No Rheumatology ROS completed.   PMFS History:  Patient Active Problem List   Diagnosis Date Noted  . Type 2 diabetes mellitus with neurological complications (HCC) 10/16/2019  . Tendinitis of right ankle 10/12/2019  . High risk medication use 01/07/2019  . Generalized joint pain 10/20/2018  . Nausea 10/20/2018  . Recurrent major depressive disorder, in full remission (HCC) 04/10/2018  . Allergic dermatitis 02/02/2018  . Major depressive disorder, recurrent episode, moderate (HCC) 08/29/2017  . Back pain with sciatica 11/13/2015  . Knee osteoarthritis 07/13/2013  . Chronic diastolic heart failure (HCC) 01/06/2013  . Snoring disorder 01/03/2013  . Thoracic spine pain 12/29/2012  . Vitamin D deficiency 12/08/2012  . Exercise intolerance 11/26/2011  . Essential hypertension 11/26/2011  . Migraine headache   . 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 Anemia 07/03/2011  . Morbid obesity (HCC) 12/03/2007    Past Medical History:  Diagnosis Date  . Chronic back pain   . Degenerative joint disease    Left TKA in 07/2009-Dr. Landau  .  Depression 2001   doesn't take any meds  . Headache(784.0)    occasionally  . Hypothyroidism    was on Levothyroxine-has been off x 4 months  . Joint pain   . Joint swelling   . Morbid obesity (HCC)   . Nocturia   . Osteoarthritis of right knee 07/13/2013  . Peripheral edema    to right leg;takes Furosemide occasionally and hasnt had one in a month  . Syncope    with palpitations; evaluated by [facility] in 2008 with normal echo    Family History  Problem Relation Age of Onset  . Diabetes Mother   . Depression Mother   . Diabetes Father        emphsema   . Hyperlipidemia Father   . Emphysema Father   . Rheum arthritis Sister   . Healthy Daughter   . Healthy Daughter   . Healthy Daughter   . Colon cancer Neg Hx    Past Surgical History:  Procedure Laterality Date  . ABDOMINAL HYSTERECTOMY  1995    no oophorectomy  . CARPAL TUNNEL RELEASE  1991   Bilateral  . CESAREAN SECTION     X2  . CHOLECYSTECTOMY    . CHOLECYSTECTOMY, LAPAROSCOPIC  2007   Dr. 2008  . COLONOSCOPY N/A 03/03/2013   Procedure: COLONOSCOPY;  Surgeon: 03/05/2013, MD;  [facility];  Service: Endoscopy;  Laterality: N/A;  930  . CRANIECTOMY SUBOCCIPITAL W/ CERVICAL LAMINECTOMY / CHIARI  2007   Repair of Arnold-Chiari malformation  . EYE SURGERY     bilateral cataract surgery  . JOINT REPLACEMENT Left 2011  . KNEE ARTHROSCOPY  1995  Dr. Cleophas Dunker   . THYROIDECTOMY, PARTIAL  2004   Left lobectomy - large benign nodule  . TONSILLECTOMY    . TOTAL KNEE ARTHROPLASTY  07/2009   Left knee , Dr. Dion Saucier   . TOTAL KNEE ARTHROPLASTY Right 07/13/2013   Procedure: TOTAL KNEE ARTHROPLASTY;  Surgeon: Eulas Post, MD;  Location: [facility] OR;  Service: Orthopedics;  Laterality: Right;   Social History   Social History Narrative   Is in a new relationship    Immunization History  Administered Date(s) Administered  . Influenza Split 01/16/2012  . Influenza Whole  02/23/2007, 12/07/2008  . Influenza,inj,Quad PF,6+ Mos 12/07/2012, 02/15/2014, 11/28/2014, 01/09/2016, 04/07/2017, 12/18/2017, 02/16/2019  . Moderna SARS-COVID-2 Vaccination 06/09/2019, 07/07/2019  . Pneumococcal Polysaccharide-23 07/31/2015  . Td 10/23/2005     Objective: Vital Signs: There were no vitals taken for this visit.   Physical Exam   Musculoskeletal Exam: ***  CDAI Exam: CDAI Score: -- Patient Global: --; Provider Global: -- Swollen: --; Tender: -- Joint Exam 11/09/2019   No joint exam has been documented for this visit   There is currently no information documented on the homunculus. Go to the Rheumatology activity and complete the homunculus joint exam.  Investigation: No additional findings.  Imaging: DG Ankle Complete Right  Result Date: 10/26/2019 Clinical:  Pain posterior ankle X-rays were done of the right ankle, three views. The ankle mortise is normal.  No fracture or loose body noted.  Bone quality is good. Impression:  Negative right ankle. Electronically Signed Darreld Mclean, MD 8/10/20213:44 PM   MM DIAG BREAST TOMO BILATERAL  Result Date: 10/12/2019 CLINICAL DATA:  Delayed 2 year follow-up for asymmetry in the RIGHT breast. EXAM: DIGITAL DIAGNOSTIC BILATERAL MAMMOGRAM WITH TOMO AND CAD COMPARISON:  Previous exam(s). ACR Breast Density Category a: The breast tissue is almost entirely fatty. FINDINGS: Stable appearance of asymmetry in the LOWER anterior portion of the RIGHT breast. On previous diagnostic evaluation, no sonographic correlate was identified for this asymmetry. No new or suspicious findings in either breast. Mammographic images were processed with CAD. IMPRESSION: No mammographic evidence for malignancy. RECOMMENDATION: Screening mammogram in one year.(Code:[id number]) I have discussed the findings and recommendations with the patient. If applicable, a reminder letter will be sent to the patient regarding the next appointment. BI-RADS CATEGORY  2:  Benign. Electronically Signed   By: Norva Pavlov M.D.   On: 10/12/2019 10:01    Recent Labs: Lab Results  Component Value Date   WBC 6.0 10/22/2019   HGB 11.9 10/22/2019   PLT 389 10/22/2019   NA 140 10/22/2019   K 4.2 10/22/2019   CL 104 10/22/2019   CO2 25 10/22/2019   GLUCOSE 95 10/22/2019   BUN 14 10/22/2019   CREATININE 0.63 10/22/2019   BILITOT 0.8 10/22/2019   ALKPHOS 81 08/14/2017   AST 17 10/22/2019   ALT 13 10/22/2019   PROT 7.3 10/22/2019   ALBUMIN 4.0 08/14/2017   CALCIUM 9.3 10/22/2019   GFRAA 116 10/22/2019   QFTBGOLDPLUS NEGATIVE 10/22/2019    Speciality Comments: No specialty comments available.  Procedures:  No procedures performed Allergies: Aleve [naproxen sodium], B12 folate [cobalamin combinations], Penicillins, Adhesive [tape], and Latex   Assessment / Plan:     Visit Diagnoses: No diagnosis found.  Orders: No orders of the defined types were placed in this encounter.  No orders of the defined types were placed in this encounter.   Face-to-face time spent with patient was *** minutes. Greater than 50% of time was  spent in counseling and coordination of care.  Follow-Up Instructions: No follow-ups on file.   Earnestine Mealing, CMA  Note - This record has been created using Editor, commissioning.  Chart creation errors have been sought, but may not always  have been located. Such creation errors do not reflect on  the standard of medical care.

## 2019-10-28 ENCOUNTER — Telehealth: Payer: Self-pay | Admitting: Orthopaedic Surgery

## 2019-10-28 MED ORDER — DIAZEPAM 10 MG PO TABS
10.0000 mg | ORAL_TABLET | Freq: Once | ORAL | 0 refills | Status: AC
Start: 2019-10-28 — End: 2019-10-28

## 2019-10-28 NOTE — Telephone Encounter (Signed)
Patient states she is going for MRI Saturday.  She states she is "a little claustroophic".  She wants to know if you can send something in for her to her pharmacy  She uses [facility] in [place]

## 2019-10-30 ENCOUNTER — Other Ambulatory Visit: Payer: Medicare HMO

## 2019-11-02 ENCOUNTER — Other Ambulatory Visit: Payer: Self-pay

## 2019-11-02 ENCOUNTER — Ambulatory Visit
Admission: RE | Admit: 2019-11-02 | Discharge: 2019-11-02 | Disposition: A | Discharge status: Home / Self Care | Payer: Medicare HMO | Source: Ambulatory Visit | Attending: Orthopaedic Surgery | Admitting: Orthopaedic Surgery

## 2019-11-02 ENCOUNTER — Ambulatory Visit: Payer: Medicare HMO | Admitting: Orthopaedic Surgery

## 2019-11-02 DIAGNOSIS — S86011A Strain of right Achilles tendon, initial encounter: Secondary | ICD-10-CM

## 2019-11-02 DIAGNOSIS — M19071 Primary osteoarthritis, right ankle and foot: Secondary | ICD-10-CM | POA: Diagnosis not present

## 2019-11-02 DIAGNOSIS — M25571 Pain in right ankle and joints of right foot: Secondary | ICD-10-CM

## 2019-11-02 DIAGNOSIS — S86311A Strain of muscle(s) and tendon(s) of peroneal muscle group at lower leg level, right leg, initial encounter: Secondary | ICD-10-CM | POA: Diagnosis not present

## 2019-11-03 ENCOUNTER — Telehealth: Payer: Self-pay | Admitting: Orthopaedic Surgery

## 2019-11-03 NOTE — Telephone Encounter (Signed)
[name] called and said she went for her MRI yesterday.  She wanted to know if you would call her with results or if she really needs to come back in for for those results.  I told her that I would have to check with you.    If you want to see her, your schedule for tomorrow is full and you're not here next week, do you want me to fit her into the schedule somewhere?  Let me know  Thanks

## 2019-11-03 NOTE — Telephone Encounter (Signed)
I can see her tomorrow.  Just extend office in the morning.  Make sure we have a report back.

## 2019-11-04 ENCOUNTER — Other Ambulatory Visit: Payer: Self-pay

## 2019-11-04 ENCOUNTER — Encounter: Payer: Self-pay | Admitting: Orthopaedic Surgery

## 2019-11-04 ENCOUNTER — Ambulatory Visit (INDEPENDENT_AMBULATORY_CARE_PROVIDER_SITE_OTHER): Payer: Medicare HMO | Admitting: Orthopaedic Surgery

## 2019-11-04 VITALS — BP 132/77 | HR 75 | Ht 62.0 in | Wt 331.0 lb

## 2019-11-04 DIAGNOSIS — Z6841 Body Mass Index (BMI) 40.0 and over, adult: Secondary | ICD-10-CM | POA: Diagnosis not present

## 2019-11-04 DIAGNOSIS — S86011D Strain of right Achilles tendon, subsequent encounter: Secondary | ICD-10-CM | POA: Diagnosis not present

## 2019-11-04 DIAGNOSIS — S86011A Strain of right Achilles tendon, initial encounter: Secondary | ICD-10-CM

## 2019-11-04 NOTE — Patient Instructions (Signed)
Driving Directions to [facility] from [facility] Office address is [address] [place] The phone number is [phone]   1. Start out going north on [address] toward [address].  Then 0.02 miles0.02 total miles 2. Take the 1st right onto [address]. Continue to follow [address].  If you reach [address] you've gone a little too far  Then 0.58 miles0.60 total miles 3. Turn right onto [address]  [address] just past [address]  Then 2.25 miles2.85 total miles 4. Take the [address] ramp toward [place].  Then 0.25 miles3.10 total miles 5. Merge onto [address]  Then 18.17 miles21.28 total miles 6. Merge onto [address]  Then 1.47 miles22.74 total miles 7. Turn right onto [address]  [address] just past [address]  Then 0.11 miles22.85 total miles  8. [address], [place], [place] [address], [address] is on the left.  

## 2019-11-04 NOTE — Progress Notes (Signed)
Patient [name], female DOB:[birth year], 59 y.o. ELF:[id number]  Chief Complaint  Patient presents with  . Ankle Pain    R/ here to go over MRI/still hurting in the back of ankle    HPI  [name] is a 59 y.o. female who has Achilles pain of the right lower leg from injury about six months ago.  She had MRI showing: IMPRESSION: 1. Severe tendinosis of the Achilles tendon with an interstitial tear distally. 2. Moderate tendinosis of the peroneus longus with a short-segment longitudinal split tear. 3. Mild tendinosis of the peroneus brevis. 4. Severe osteoarthritis of the navicular-cuneiform joint. Mild osteoarthritis of the talonavicular joint. Mild osteoarthritis of the posterior subtalar joint. Severe osteoarthritis of the third tarsometatarsal joint with subchondral reactive marrow changes. Moderate osteoarthritis of the second tarsometatarsal joint with subchondral reactive marrow edema.  I have explained the findings. I will have Dr. Lajoyce Corners at [facility] see her for further evaluation.  She did have prior injury years ago when fork lift ran over her foot causing the arthritis in the foot.  Patient is agreeable to go to [place].  She will continue the CAM walker.   Body mass index is 60.54 kg/m.  The patient meets the AMA guidelines for Morbid (severe) obesity with a BMI > 40.0 and I have recommended weight loss.   ROS  Review of Systems  Constitutional: Positive for activity change.  Musculoskeletal: Positive for arthralgias, back pain, gait problem and joint swelling.  Neurological: Positive for headaches.  All other systems reviewed and are negative.   All other systems reviewed and are negative.  The following is a summary of the past history medically, past history surgically, known current medicines, social history and family history.  This information is gathered electronically by the computer from prior information and  documentation.  I review this each visit and have found including this information at this point in the chart is beneficial and informative.    Past Medical History:  Diagnosis Date  . Chronic back pain   . Degenerative joint disease    Left TKA in 07/2009-Dr. Landau  . Depression 2001   doesn't take any meds  . Headache(784.0)    occasionally  . Hypothyroidism    was on Levothyroxine-has been off x 4 months  . Joint pain   . Joint swelling   . Morbid obesity (HCC)   . Nocturia   . Osteoarthritis of right knee 07/13/2013  . Peripheral edema    to right leg;takes Furosemide occasionally and hasnt had one in a month  . Syncope    with palpitations; evaluated by [facility] in 2008 with normal echo    Past Surgical History:  Procedure Laterality Date  . ABDOMINAL HYSTERECTOMY  1995    no oophorectomy  . CARPAL TUNNEL RELEASE  1991   Bilateral  . CESAREAN SECTION     X2  . CHOLECYSTECTOMY    . CHOLECYSTECTOMY, LAPAROSCOPIC  2007   Dr. Lovell Sheehan  . COLONOSCOPY N/A 03/03/2013   Procedure: COLONOSCOPY;  Surgeon: Malissa Hippo, MD;  [facility];  Service: Endoscopy;  Laterality: N/A;  930  . CRANIECTOMY SUBOCCIPITAL W/ CERVICAL LAMINECTOMY / CHIARI  2007   Repair of Arnold-Chiari malformation  . EYE SURGERY     bilateral cataract surgery  . JOINT REPLACEMENT Left 2011  . KNEE ARTHROSCOPY  1995   Dr. Cleophas Dunker   . THYROIDECTOMY, PARTIAL  2004   Left lobectomy - large  benign nodule  . TONSILLECTOMY    . TOTAL KNEE ARTHROPLASTY  07/2009   Left knee , Dr. Dion Saucier   . TOTAL KNEE ARTHROPLASTY Right 07/13/2013   Procedure: TOTAL KNEE ARTHROPLASTY;  Surgeon: Eulas Post, MD;  Location: [facility] OR;  Service: Orthopedics;  Laterality: Right;    Family History  Problem Relation Age of Onset  . Diabetes Mother   . Depression Mother   . Diabetes Father        emphsema   . Hyperlipidemia Father   . Emphysema Father   . Rheum arthritis Sister   .  Healthy Daughter   . Healthy Daughter   . Healthy Daughter   . Colon cancer Neg Hx     Social History Social History   Tobacco Use  . Smoking status: Never Smoker  . Smokeless tobacco: Never Used  Vaping Use  . Vaping Use: Never used  Substance Use Topics  . Alcohol use: Yes    Alcohol/week: 0.0 standard drinks    Comment: rarely  . Drug use: No    Allergies  Allergen Reactions  . Aleve [Naproxen Sodium] Shortness Of Breath  . B12 Folate [Cobalamin Combinations] Dermatitis  . Penicillins Swelling    .Has patient had a PCN reaction causing immediate rash, facial/tongue/throat swelling, SOB or lightheadedness with hypotension: Yes Has patient had a PCN reaction causing severe rash involving mucus membranes or skin necrosis: No Has patient had a PCN reaction that required hospitalization: No Has patient had a PCN reaction occurring within the last 10 years: No If all of the above answers are "NO", then may proceed with Cephalosporin use.   . Adhesive [Tape]     rash  . Latex Rash    Current Outpatient Medications  Medication Sig Dispense Refill  . benazepril (LOTENSIN) 10 MG tablet Take 1 tablet (10 mg total) by mouth daily. 90 tablet 3  . folic acid (FOLVITE) 1 MG tablet Take 2 tablets (2 mg total) by mouth daily. 180 tablet 3  . gabapentin (NEURONTIN) 300 MG capsule Take 1 capsule (300 mg total) by mouth at bedtime. 90 capsule 0  . hydrochlorothiazide (HYDRODIURIL) 25 MG tablet Take 1 tablet (25 mg total) by mouth daily. 90 tablet 3  . ibuprofen (ADVIL) 800 MG tablet Take 1 tablet by mouth twice weekly as needed for knee or back pain 30 tablet 0  . metFORMIN (GLUCOPHAGE) 500 MG tablet Take 1 tablet (500 mg total) by mouth daily. 90 tablet 3  . Methotrexate Sodium (METHOTREXATE, PF,) 50 MG/2ML injection INJECT 1 ML UNDER THE SKIN WEEKLY 12 mL 0  . phentermine (ADIPEX-P) 37.5 MG tablet Take 1 tablet (37.5 mg total) by mouth daily before breakfast. 30 tablet 1  .  rosuvastatin (CRESTOR) 5 MG tablet Take 1 tablet (5 mg total) by mouth daily. 90 tablet 3  . sertraline (ZOLOFT) 100 MG tablet Take 1.5 tablets (150 mg total) by mouth daily. 135 tablet 1  . TUBERCULIN SYR 1CC/27GX1/2" (B-D TB SYRINGE 1CC/27GX1/2") 27G X 1/2" 1 ML MISC 12 Syringes by Does not apply route once a week. 12 each 3   No current facility-administered medications for this visit.     Physical Exam  Blood pressure 132/77, pulse 75, height 5\' 2"  (1.575 m), weight (!) 331 lb (150.1 kg).  Constitutional: overall normal hygiene, normal nutrition, well developed, normal grooming, normal body habitus. Assistive device:CAM walker  Musculoskeletal: gait and station Limp right, muscle tone and strength are normal, no tremors or atrophy  is present.  .  Neurological: coordination overall normal.  Deep tendon reflex/nerve stretch intact.  Sensation normal.  Cranial nerves II-XII intact.   Skin:   Normal overall no scars, lesions, ulcers or rashes. No psoriasis.  Psychiatric: Alert and oriented x 3.  Recent memory intact, remote memory unclear.  Normal mood and affect. Well groomed.  Good eye contact.  Cardiovascular: overall no swelling, no varicosities, no edema bilaterally, normal temperatures of the legs and arms, no clubbing, cyanosis and good capillary refill.  Right Achilles has defect distally about inch from insertion.  Painful. ROM is full.  NV intact.  Lymphatic: palpation is normal.  All other systems reviewed and are negative   The patient has been educated about the nature of the problem(s) and counseled on treatment options.  The patient appeared to understand what I have discussed and is in agreement with it.  Encounter Diagnoses  Name Primary?  . Achilles tendon tear, right, initial encounter Yes  . Morbid obesity (HCC)   . Body mass index 40.0-44.9, adult [facility])     PLAN Call if any problems.  Precautions discussed.  Continue current medications.   Return to  clinic to see Dr. Lajoyce Corners    Electronically Signed Darreld Mclean, MD 8/19/202111:15 AM

## 2019-11-05 ENCOUNTER — Other Ambulatory Visit: Payer: Self-pay | Admitting: Family Medicine

## 2019-11-05 ENCOUNTER — Ambulatory Visit (HOSPITAL_COMMUNITY): Payer: Medicare HMO

## 2019-11-05 MED ORDER — LORAZEPAM 2 MG PO TABS
ORAL_TABLET | ORAL | 0 refills | Status: DC
Start: 2019-11-05 — End: 2019-12-09

## 2019-11-06 ENCOUNTER — Other Ambulatory Visit: Payer: Self-pay

## 2019-11-06 ENCOUNTER — Ambulatory Visit (HOSPITAL_COMMUNITY)
Admission: RE | Admit: 2019-11-06 | Discharge: 2019-11-06 | Disposition: A | Discharge status: Home / Self Care | Payer: Medicare HMO | Source: Ambulatory Visit | Attending: Family Medicine | Admitting: Family Medicine

## 2019-11-06 DIAGNOSIS — M549 Dorsalgia, unspecified: Secondary | ICD-10-CM | POA: Insufficient documentation

## 2019-11-06 DIAGNOSIS — M48061 Spinal stenosis, lumbar region without neurogenic claudication: Secondary | ICD-10-CM | POA: Diagnosis not present

## 2019-11-06 DIAGNOSIS — M47816 Spondylosis without myelopathy or radiculopathy, lumbar region: Secondary | ICD-10-CM | POA: Diagnosis not present

## 2019-11-06 DIAGNOSIS — M4319 Spondylolisthesis, multiple sites in spine: Secondary | ICD-10-CM | POA: Diagnosis not present

## 2019-11-06 DIAGNOSIS — M5430 Sciatica, unspecified side: Secondary | ICD-10-CM | POA: Diagnosis not present

## 2019-11-06 DIAGNOSIS — M5126 Other intervertebral disc displacement, lumbar region: Secondary | ICD-10-CM | POA: Diagnosis not present

## 2019-11-09 ENCOUNTER — Telehealth: Payer: Self-pay

## 2019-11-09 ENCOUNTER — Ambulatory Visit: Payer: Medicare HMO | Admitting: Physician Assistant

## 2019-11-10 ENCOUNTER — Encounter: Payer: Self-pay | Admitting: Family Medicine

## 2019-11-11 DIAGNOSIS — E611 Iron deficiency: Secondary | ICD-10-CM | POA: Diagnosis not present

## 2019-11-11 DIAGNOSIS — Z1331 Encounter for screening for depression: Secondary | ICD-10-CM | POA: Diagnosis not present

## 2019-11-11 DIAGNOSIS — Z6841 Body Mass Index (BMI) 40.0 and over, adult: Secondary | ICD-10-CM | POA: Diagnosis not present

## 2019-11-11 DIAGNOSIS — E559 Vitamin D deficiency, unspecified: Secondary | ICD-10-CM | POA: Diagnosis not present

## 2019-11-11 DIAGNOSIS — R5383 Other fatigue: Secondary | ICD-10-CM | POA: Diagnosis not present

## 2019-11-11 DIAGNOSIS — N951 Menopausal and female climacteric states: Secondary | ICD-10-CM | POA: Diagnosis not present

## 2019-11-11 DIAGNOSIS — I10 Essential (primary) hypertension: Secondary | ICD-10-CM | POA: Diagnosis not present

## 2019-11-11 DIAGNOSIS — Z72820 Sleep deprivation: Secondary | ICD-10-CM | POA: Diagnosis not present

## 2019-11-11 DIAGNOSIS — Z1339 Encounter for screening examination for other mental health and behavioral disorders: Secondary | ICD-10-CM | POA: Diagnosis not present

## 2019-11-14 ENCOUNTER — Encounter: Payer: Self-pay | Admitting: Family Medicine

## 2019-11-14 DIAGNOSIS — R69 Illness, unspecified: Secondary | ICD-10-CM | POA: Diagnosis not present

## 2019-11-15 ENCOUNTER — Other Ambulatory Visit: Payer: Self-pay

## 2019-11-15 ENCOUNTER — Ambulatory Visit (HOSPITAL_COMMUNITY): Payer: Medicare HMO

## 2019-11-15 DIAGNOSIS — S86019S Strain of unspecified Achilles tendon, sequela: Secondary | ICD-10-CM

## 2019-11-15 NOTE — Progress Notes (Deleted)
BH MD/PA/NP OP Progress Note  11/15/2019 2:44 PM [name]  MRN:  [id number]  Chief Complaint:  HPI: *** Visit Diagnosis: No diagnosis found.  Past Psychiatric History: Please see initial evaluation for full details. I have reviewed the history. No updates at this time.     Past Medical History:  Past Medical History:  Diagnosis Date  . Chronic back pain   . Degenerative joint disease    Left TKA in 07/2009-Dr. Landau  . Depression 2001   doesn't take any meds  . Headache(784.0)    occasionally  . Hypothyroidism    was on Levothyroxine-has been off x 4 months  . Joint pain   . Joint swelling   . Morbid obesity (HCC)   . Nocturia   . Osteoarthritis of right knee 07/13/2013  . Peripheral edema    to right leg;takes Furosemide occasionally and hasnt had one in a month  . Syncope    with palpitations; evaluated by [facility] in 2008 with normal echo    Past Surgical History:  Procedure Laterality Date  . ABDOMINAL HYSTERECTOMY  1995    no oophorectomy  . CARPAL TUNNEL RELEASE  1991   Bilateral  . CESAREAN SECTION     X2  . CHOLECYSTECTOMY    . CHOLECYSTECTOMY, LAPAROSCOPIC  2007   Dr. Lovell Sheehan  . COLONOSCOPY N/A 03/03/2013   Procedure: COLONOSCOPY;  Surgeon: Malissa Hippo, MD;  Location: [facility];  Service: Endoscopy;  Laterality: N/A;  930  . CRANIECTOMY SUBOCCIPITAL W/ CERVICAL LAMINECTOMY / CHIARI  2007   Repair of Arnold-Chiari malformation  . EYE SURGERY     bilateral cataract surgery  . JOINT REPLACEMENT Left 2011  . KNEE ARTHROSCOPY  1995   Dr. Cleophas Dunker   . THYROIDECTOMY, PARTIAL  2004   Left lobectomy - large benign nodule  . TONSILLECTOMY    . TOTAL KNEE ARTHROPLASTY  07/2009   Left knee , Dr. Dion Saucier   . TOTAL KNEE ARTHROPLASTY Right 07/13/2013   Procedure: TOTAL KNEE ARTHROPLASTY;  Surgeon: Eulas Post, MD;  Location: [facility] OR;  Service: Orthopedics;  Laterality: Right;    Family Psychiatric History: Please see  initial evaluation for full details. I have reviewed the history. No updates at this time.     Family History:  Family History  Problem Relation Age of Onset  . Diabetes Mother   . Depression Mother   . Diabetes Father        emphsema   . Hyperlipidemia Father   . Emphysema Father   . Rheum arthritis Sister   . Healthy Daughter   . Healthy Daughter   . Healthy Daughter   . Colon cancer Neg Hx     Social History:  Social History   Socioeconomic History  . Marital status: Significant Other    Spouse name: Not on file  . Number of children: 3  . Years of education: 61  . Highest education level: 12th grade  Occupational History  . Occupation: Employed seeking disabilty 2012  Tobacco Use  . Smoking status: Never Smoker  . Smokeless tobacco: Never Used  Vaping Use  . Vaping Use: Never used  Substance and Sexual Activity  . Alcohol use: Yes    Alcohol/week: 0.0 standard drinks    Comment: rarely  . Drug use: No  . Sexual activity: Yes    Birth control/protection: Surgical  Other Topics Concern  . Not on file  Social History Narrative  Is in a new relationship    Social Determinants of Health   Financial Resource Strain:   . Difficulty of Paying Living Expenses: Not on file  Food Insecurity:   . Worried About Programme researcher, broadcasting/film/video in the Last Year: Not on file  . Ran Out of Food in the Last Year: Not on file  Transportation Needs:   . Lack of Transportation (Medical): Not on file  . Lack of Transportation (Non-Medical): Not on file  Physical Activity:   . Days of Exercise per Week: Not on file  . Minutes of Exercise per Session: Not on file  Stress:   . Feeling of Stress : Not on file  Social Connections:   . Frequency of Communication with Friends and Family: Not on file  . Frequency of Social Gatherings with Friends and Family: Not on file  . Attends Religious Services: Not on file  . Active Member of Clubs or Organizations: Not on file  . Attends Tax inspector Meetings: Not on file  . Marital Status: Not on file    Allergies:  Allergies  Allergen Reactions  . Aleve [Naproxen Sodium] Shortness Of Breath  . B12 Folate [Cobalamin Combinations] Dermatitis  . Penicillins Swelling    .Has patient had a PCN reaction causing immediate rash, facial/tongue/throat swelling, SOB or lightheadedness with hypotension: Yes Has patient had a PCN reaction causing severe rash involving mucus membranes or skin necrosis: No Has patient had a PCN reaction that required hospitalization: No Has patient had a PCN reaction occurring within the last 10 years: No If all of the above answers are "NO", then may proceed with Cephalosporin use.   . Adhesive [Tape]     rash  . Latex Rash    Metabolic Disorder Labs: Lab Results  Component Value Date   HGBA1C 5.8 (A) 10/12/2019   MPG 131 09/09/2018   MPG 128 04/14/2018   No results found for: PROLACTIN Lab Results  Component Value Date   CHOL 147 04/14/2018   TRIG 94 04/14/2018   HDL 44 (L) 04/14/2018   CHOLHDL 3.3 04/14/2018   VLDL 12 10/02/2016   LDLCALC 84 04/14/2018   LDLCALC 60 04/11/2017   Lab Results  Component Value Date   TSH 3.67 04/14/2018   TSH 2.34 04/11/2017    Therapeutic Level Labs: No results found for: LITHIUM No results found for: VALPROATE No components found for:  CBMZ  Current Medications: Current Outpatient Medications  Medication Sig Dispense Refill  . benazepril (LOTENSIN) 10 MG tablet Take 1 tablet (10 mg total) by mouth daily. 90 tablet 3  . folic acid (FOLVITE) 1 MG tablet Take 2 tablets (2 mg total) by mouth daily. 180 tablet 3  . gabapentin (NEURONTIN) 300 MG capsule Take 1 capsule (300 mg total) by mouth at bedtime. 90 capsule 0  . hydrochlorothiazide (HYDRODIURIL) 25 MG tablet Take 1 tablet (25 mg total) by mouth daily. 90 tablet 3  . ibuprofen (ADVIL) 800 MG tablet Take 1 tablet by mouth twice weekly as needed for knee or back pain 30 tablet 0  .  LORazepam (ATIVAN) 2 MG tablet Take one tablet 30 minutes before study by mouth, and may repeat after  15 minutes one additional time only,  if still anxious 2 tablet 0  . metFORMIN (GLUCOPHAGE) 500 MG tablet Take 1 tablet (500 mg total) by mouth daily. 90 tablet 3  . Methotrexate Sodium (METHOTREXATE, PF,) 50 MG/2ML injection INJECT 1 ML UNDER THE SKIN WEEKLY 12  mL 0  . phentermine (ADIPEX-P) 37.5 MG tablet Take 1 tablet (37.5 mg total) by mouth daily before breakfast. 30 tablet 1  . rosuvastatin (CRESTOR) 5 MG tablet Take 1 tablet (5 mg total) by mouth daily. 90 tablet 3  . sertraline (ZOLOFT) 100 MG tablet Take 1.5 tablets (150 mg total) by mouth daily. 135 tablet 1  . TUBERCULIN SYR 1CC/27GX1/2" (B-D TB SYRINGE 1CC/27GX1/2") 27G X 1/2" 1 ML MISC 12 Syringes by Does not apply route once a week. 12 each 3   No current facility-administered medications for this visit.     Musculoskeletal: Strength & Muscle Tone: N.A Gait & Station: N/A Patient leans: N/A  Psychiatric Specialty Exam: Review of Systems  There were no vitals taken for this visit.There is no height or weight on file to calculate BMI.  General Appearance: {Appearance:22683}  Eye Contact:  {BHH EYE CONTACT:22684}  Speech:  Clear and Coherent  Volume:  Normal  Mood:  {BHH MOOD:22306}  Affect:  {Affect (PAA):22687}  Thought Process:  Coherent  Orientation:  Full (Time, Place, and Person)  Thought Content: Logical   Suicidal Thoughts:  {ST/HT (PAA):22692}  Homicidal Thoughts:  {ST/HT (PAA):22692}  Memory:  Immediate;   Good  Judgement:  {Judgement (PAA):22694}  Insight:  {Insight (PAA):22695}  Psychomotor Activity:  Normal  Concentration:  Concentration: Good and Attention Span: Good  Recall:  Good  Fund of Knowledge: Good  Language: Good  Akathisia:  No  Handed:  Right  AIMS (if indicated): not done  Assets:  Communication Skills Desire for Improvement  ADL's:  Intact  Cognition: WNL  Sleep:  {BHH  GOOD/FAIR/POOR:22877}   Screenings: AIMS     [facility]  AIMS Total Score 0    AUDIT     [facility]  Alcohol Use Disorder Identification Test Final Score (AUDIT) 0    GAD-7     Virtual BH Phone Follow Up from 08/26/2017 in [facility] Virtual BH Visit from 08/14/2017 in [facility]  Total GAD-7 Score 7 14    PHQ2-9     Office Visit from 10/12/2019 in [facility] Video Visit from 06/24/2019 in [facility] Office Visit from 10/20/2018 in [facility] Office Visit from 09/09/2018 in [facility] Office Visit from 04/22/2018 in [facility]  PHQ-2 Total Score 0 0 0 0 0  PHQ-9 Total Score -- 1 -- -- 2       Assessment and Plan:  [name] is a 60 y.o. year old female with a history of depression, hypertension, obesity,, who presents for follow up appointment for below.    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  She is agreeable to continue current dose of sertraline as maintenance therapy given she had at least a few times of depressive episode in the past.  Will discontinue hydroxyzine given she has not taken this medication.  Discussed behavioral activation.   Plan  1. Continue sertraline 150 mg daily  2.Discontinue hydroxyzine 3.Next appointment:9/6 at 9:10 for 20 mins, video - On Orencia (abatacept) for RA   The patient demonstrates the following risk factors for suicide: Chronic risk factors for suicide include:psychiatric disorder ofdepressionand completed suicide in a family member. Acute risk factorsfor suicide include: loss (financial, interpersonal,  professional) and recent discharge from inpatient  psychiatry. Protective factorsfor this patient include: positive social support, responsibility to others (children, family), coping skills and hope for the future. Considering these factors, the overall suicide risk at this point appears to below. Patientisappropriate for outpatient follow up.   Neysa Hotter, MD 11/15/2019, 2:44 PM

## 2019-11-17 DIAGNOSIS — M5431 Sciatica, right side: Secondary | ICD-10-CM | POA: Diagnosis not present

## 2019-11-17 DIAGNOSIS — M7661 Achilles tendinitis, right leg: Secondary | ICD-10-CM | POA: Diagnosis not present

## 2019-11-18 ENCOUNTER — Ambulatory Visit: Payer: Medicare HMO | Admitting: Orthopedic Surgery

## 2019-11-23 ENCOUNTER — Ambulatory Visit (HOSPITAL_COMMUNITY): Payer: Medicare HMO | Admitting: Psychiatry

## 2019-11-30 ENCOUNTER — Encounter: Payer: Self-pay | Admitting: Family Medicine

## 2019-11-30 ENCOUNTER — Ambulatory Visit (INDEPENDENT_AMBULATORY_CARE_PROVIDER_SITE_OTHER): Payer: Medicare HMO | Admitting: Family Medicine

## 2019-11-30 ENCOUNTER — Other Ambulatory Visit: Payer: Self-pay

## 2019-11-30 VITALS — BP 146/77 | HR 61 | Temp 97.0°F | Resp 20 | Ht 62.0 in | Wt 324.0 lb

## 2019-11-30 DIAGNOSIS — S51859A Open bite of unspecified forearm, initial encounter: Secondary | ICD-10-CM | POA: Insufficient documentation

## 2019-11-30 DIAGNOSIS — S51852A Open bite of left forearm, initial encounter: Secondary | ICD-10-CM

## 2019-11-30 DIAGNOSIS — W503XXA Accidental bite by another person, initial encounter: Secondary | ICD-10-CM

## 2019-11-30 HISTORY — DX: Accidental bite by another person, initial encounter: W50.3XXA

## 2019-11-30 HISTORY — DX: Accidental bite by another person, initial encounter: S51.859A

## 2019-11-30 MED ORDER — TETANUS-DIPHTH-ACELL PERTUSSIS 5-2.5-18.5 LF-MCG/0.5 IM SUSP
0.5000 mL | Freq: Once | INTRAMUSCULAR | Status: AC
  Administered 2019-11-30: 0.5 mL via INTRAMUSCULAR

## 2019-11-30 MED ORDER — DOXYCYCLINE HYCLATE 100 MG PO CAPS: 100.0000 mg | ORAL_CAPSULE | Freq: Two times a day (BID) | ORAL | 0 refills | Status: AC
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
## 2019-11-30 NOTE — Telephone Encounter (Signed)
Called pt and made an appt with Dahlia Client 11-30-19 at 3:40
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
## 2019-11-30 NOTE — Patient Instructions (Signed)
I appreciate the opportunity to provide you with care for your health and wellness. Today we discussed: recent bite  Follow up: as scheduled  No labs or referrals today  Medications ordered, please take in full.  Tdap vaccine updated today as well  Please continue to practice social distancing to keep you, your family, and our community safe.  If you must go out, please wear a mask and practice good handwashing.  It was a pleasure to see you and I look forward to continuing to work together on your health and well-being. Please do not hesitate to call the office if you need care or have questions about your care.  Have a wonderful day and week. With Gratitude, Tereasa Coop, DNP, AGNP-BC

## 2019-12-01 ENCOUNTER — Ambulatory Visit: Payer: Medicare HMO | Admitting: Family Medicine

## 2019-12-06 DIAGNOSIS — Z6841 Body Mass Index (BMI) 40.0 and over, adult: Secondary | ICD-10-CM | POA: Diagnosis not present

## 2019-12-06 DIAGNOSIS — E559 Vitamin D deficiency, unspecified: Secondary | ICD-10-CM | POA: Diagnosis not present
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
## 2019-12-08 ENCOUNTER — Ambulatory Visit: Payer: Medicare HMO | Admitting: Family Medicine

## 2019-12-09 ENCOUNTER — Ambulatory Visit (INDEPENDENT_AMBULATORY_CARE_PROVIDER_SITE_OTHER): Payer: Medicare HMO | Admitting: Family Medicine

## 2019-12-09 ENCOUNTER — Encounter: Payer: Self-pay | Admitting: Family Medicine

## 2019-12-09 ENCOUNTER — Other Ambulatory Visit: Payer: Self-pay

## 2019-12-09 VITALS — BP 119/73 | HR 64 | Resp 16 | Ht 62.0 in | Wt 327.0 lb

## 2019-12-09 DIAGNOSIS — Z23 Encounter for immunization: Secondary | ICD-10-CM | POA: Diagnosis not present

## 2019-12-09 DIAGNOSIS — Z0000 Encounter for general adult medical examination without abnormal findings: Secondary | ICD-10-CM | POA: Diagnosis not present

## 2019-12-09 DIAGNOSIS — I10 Essential (primary) hypertension: Secondary | ICD-10-CM

## 2019-12-09 DIAGNOSIS — R69 Illness, unspecified: Secondary | ICD-10-CM | POA: Diagnosis not present

## 2019-12-09 DIAGNOSIS — E559 Vitamin D deficiency, unspecified: Secondary | ICD-10-CM

## 2019-12-09 DIAGNOSIS — Z13220 Encounter for screening for lipoid disorders: Secondary | ICD-10-CM

## 2019-12-09 NOTE — Assessment & Plan Note (Signed)

## 2019-12-09 NOTE — Progress Notes (Signed)
    [name]     MRN: [id number]      DOB: 22-Oct-1960  HPI: Patient is in for annual physical exam. States stopped both benazepril and crestor due to intolerance Recent labs, if available are reviewed. Immunization is reviewed , and  updated if needed.   PE: BP 119/73   Pulse 64   Resp 16   Ht 5\' 2"  (1.575 m)   Wt (!) 327 lb (148.3 kg)   SpO2 94%   BMI 59.81 kg/m   Pleasant  female, alert and oriented x 3, in no cardio-pulmonary distress. Afebrile. HEENT No facial trauma or asymetry. Sinuses non tender.  Extra occullar muscles intact.. External ears normal, . Neck: supple, no adenopathy,JVD or thyromegaly.No bruits.  Chest: Clear to ascultation bilaterally.No crackles or wheezes. Non tender to palpation  Breast: No asymetry,no masses or lumps. No tenderness. No nipple discharge or inversion. No axillary or supraclavicular adenopathy  Cardiovascular system; Heart sounds normal,  S1 and  S2 ,no S3.  No murmur, or thrill. Apical beat not displaced Peripheral pulses normal.  Abdomen: Soft, non tender, no organomegaly or masses. No bruits. Bowel sounds normal. No guarding, tenderness or rebound.   .   Musculoskeletal exam: Decreased though adequate  ROM of spine, hips , shoulders and knees.  deformity ,swelling and  crepitus noted. In knees No muscle wasting or atrophy.   Neurologic: Cranial nerves 2 to 12 intact. Power, tone ,sensation and reflexes normal throughout.  disturbance in gait. No tremor.  Skin: Intact, no ulceration, erythema , scaling or rash noted. Pigmentation normal throughout  Psych; Normal mood and affect. Judgement and concentration normal   Assessment & Plan:  Annual physical exam Annual exam as documented. Counseling done  re healthy lifestyle involving commitment to 150 minutes exercise per week, heart healthy diet, and attaining healthy weight.The importance of adequate sleep also discussed. Regular seat belt use  and home safety, is also discussed. Changes in health habits are decided on by the patient with goals and time frames  set for achieving them. Immunization and cancer screening needs are specifically addressed at this visit.   Morbid obesity  Patient re-educated about  the importance of commitment to a  minimum of 150 minutes of exercise per week as able.  The importance of healthy food choices with portion control discussed, as well as eating regularly and within a 12 hour window most days. The need to choose "clean , green" food 50 to 75% of the time is discussed, as well as to make water the primary drink and set a goal of 64 ounces water daily.    Weight /BMI 12/09/2019 11/30/2019 11/04/2019  WEIGHT 327 lb 324 lb 331 lb  HEIGHT 5\' 2"  5\' 2"  5\' 2"   BMI 59.81 kg/m2 59.26 kg/m2 60.54 kg/m2  Some encounter information is confidential and restricted. Go to Review Flowsheets activity to see all data.

## 2019-12-09 NOTE — Patient Instructions (Addendum)
F/u in office with MD re evaluate weight and blood sugar early February, call if you need me before  Flu vaccine today   Fasting lipid, TSH and Vit D 1 week before next visit  Congrats on 7 pound weight loss  Vision screen in office today  You may stop aspirin, you are not diabetic   It is important that you exercise regularly at least 30 minutes 5 times a week. If you develop chest pain, have severe difficulty breathing, or feel very tired, stop exercising immediately and seek medical attention  Think about what you will eat, plan ahead. Choose " clean, green, fresh or frozen" over canned, processed or packaged foods which are more sugary, salty and fatty. 70 to 75% of food eaten should be vegetables and fruit. Three meals at set times with snacks allowed between meals, but they must be fruit or vegetables. Aim to eat over a 12 hour period , example 7 am to 7 pm, and STOP after  your last meal of the day. Drink water,generally about 64 ounces per day, no other drink is as healthy. Fruit juice is best enjoyed in a healthy way, by EATING the fruit. Thanks for choosing [facility], we consider it a privelige to serve you.

## 2019-12-11 ENCOUNTER — Encounter: Payer: Self-pay | Admitting: Family Medicine

## 2019-12-11 NOTE — Assessment & Plan Note (Signed)
  Patient re-educated about  the importance of commitment to a  minimum of 150 minutes of exercise per week as able.  The importance of healthy food choices with portion control discussed, as well as eating regularly and within a 12 hour window most days. The need to choose "clean , green" food 50 to 75% of the time is discussed, as well as to make water the primary drink and set a goal of 64 ounces water daily.    Weight /BMI 12/09/2019 11/30/2019 11/04/2019  WEIGHT 327 lb 324 lb 331 lb  HEIGHT 5\' 2"  5\' 2"  5\' 2"   BMI 59.81 kg/m2 59.26 kg/m2 60.54 kg/m2  Some encounter information is confidential and restricted. Go to Review Flowsheets activity to see all data.

## 2019-12-14 DIAGNOSIS — I10 Essential (primary) hypertension: Secondary | ICD-10-CM | POA: Diagnosis not present

## 2019-12-14 DIAGNOSIS — Z6841 Body Mass Index (BMI) 40.0 and over, adult: Secondary | ICD-10-CM | POA: Diagnosis not present

## 2019-12-21 ENCOUNTER — Ambulatory Visit: Payer: Medicare HMO | Admitting: Physician Assistant

## 2019-12-21 ENCOUNTER — Other Ambulatory Visit: Payer: Self-pay

## 2019-12-21 ENCOUNTER — Encounter: Payer: Self-pay | Admitting: Physician Assistant

## 2019-12-21 VITALS — BP 109/69 | HR 61 | Ht 62.0 in | Wt 330.8 lb

## 2019-12-21 DIAGNOSIS — Z96653 Presence of artificial knee joint, bilateral: Secondary | ICD-10-CM | POA: Diagnosis not present

## 2019-12-21 DIAGNOSIS — Z8719 Personal history of other diseases of the digestive system: Secondary | ICD-10-CM | POA: Diagnosis not present

## 2019-12-21 DIAGNOSIS — M0609 Rheumatoid arthritis without rheumatoid factor, multiple sites: Secondary | ICD-10-CM

## 2019-12-21 DIAGNOSIS — Z79899 Other long term (current) drug therapy: Secondary | ICD-10-CM

## 2019-12-21 DIAGNOSIS — E890 Postprocedural hypothyroidism: Secondary | ICD-10-CM

## 2019-12-21 DIAGNOSIS — E559 Vitamin D deficiency, unspecified: Secondary | ICD-10-CM

## 2019-12-21 DIAGNOSIS — Z8261 Family history of arthritis: Secondary | ICD-10-CM

## 2019-12-21 DIAGNOSIS — M5030 Other cervical disc degeneration, unspecified cervical region: Secondary | ICD-10-CM

## 2019-12-21 DIAGNOSIS — I5032 Chronic diastolic (congestive) heart failure: Secondary | ICD-10-CM | POA: Diagnosis not present

## 2019-12-21 DIAGNOSIS — M19072 Primary osteoarthritis, left ankle and foot: Secondary | ICD-10-CM

## 2019-12-21 DIAGNOSIS — R7303 Prediabetes: Secondary | ICD-10-CM

## 2019-12-21 DIAGNOSIS — Z8669 Personal history of other diseases of the nervous system and sense organs: Secondary | ICD-10-CM

## 2019-12-21 DIAGNOSIS — Z8659 Personal history of other mental and behavioral disorders: Secondary | ICD-10-CM

## 2019-12-21 DIAGNOSIS — I10 Essential (primary) hypertension: Secondary | ICD-10-CM

## 2019-12-21 DIAGNOSIS — M19071 Primary osteoarthritis, right ankle and foot: Secondary | ICD-10-CM | POA: Diagnosis not present

## 2019-12-21 MED ORDER — RINVOQ 15 MG PO TB24: 15.0000 mg | ORAL_TABLET | Freq: Every day | ORAL | 0 refills | Status: DC

## 2019-12-21 NOTE — Patient Instructions (Signed)
COVID-19 vaccine recommendations:  ° °COVID-19 vaccine is recommended for everyone (unless you are allergic to a vaccine component), even if you are on a medication that suppresses your immune system.  ° °If you are on Methotrexate, Cellcept (mycophenolate), Rinvoq, Xeljanz, and Olumiant- hold the medication for 1 week after each vaccine. Hold Methotrexate for 2 weeks after the single dose COVID-19 vaccine.  ° °If you are on Orencia subcutaneous injection - hold medication one week prior to and one week after the first COVID-19 vaccine dose (only).  ° °If you are on Orencia IV infusions- time vaccination administration so that the first COVID-19 vaccination will occur four weeks after the infusion and postpone the subsequent infusion by one week.  ° °If you are on Cyclophosphamide or Rituxan infusions please contact your doctor prior to receiving the COVID-19 vaccine.  ° °Do not take Tylenol or any anti-inflammatory medications (NSAIDs) 24 hours prior to the COVID-19 vaccination.  ° °There is no direct evidence about the efficacy of the COVID-19 vaccine in individuals who are on medications that suppress the immune system.  ° °Even if you are fully vaccinated, and you are on any medications that suppress your immune system, please continue to wear a mask, maintain at least six feet social distance and practice hand hygiene.  ° °If you develop a COVID-19 infection, please contact your PCP or our office to determine if you need antibody infusion. ° °The booster vaccine is now available for immunocompromised patients. It is advised that if you had Pfizer vaccine you should get Pfizer booster.  If you had a Moderna vaccine then you should get a Moderna booster. Johnson and Johnson does not have a booster vaccine at this time. ° °Please see the following web sites for updated information.   ° °https://www.[facility]heumatology.org/Portals/0/Files/COVID-19-Vaccination-Patient-Resources.pdf ° °https://www.[facility]heumatology.org/About-Us/Newsroom/Press-Releases/ID/1159 ° °Standing Labs °We placed an order today for your standing lab work.  ° °Please have your standing labs drawn in November and every 3 months  ° °If possible, please have your labs drawn 2 weeks prior to your appointment so that the provider can discuss your results at your appointment. ° °We have open lab daily °Monday through Thursday from 8:30-12:30 PM and 1:30-4:30 PM and Friday from 8:30-12:30 PM and 1:30-4:00 PM °at the office of Dr. Shaili Deveshwar, [facility].   °Please be advised, patients with office appointments requiring lab work will take precedents over walk-in lab work.  °If possible, please come for your lab work on Monday and Friday afternoons, as you may experience shorter wait times. °The office is located at [address], [address], [place], [place] [address] °No appointment is necessary.   °Labs are drawn by [facility]. Please bring your co-pay at the time of your lab draw.  You may receive a bill from [facility] for your lab work. ° °If you wish to have your labs drawn at another location, please call the office 24 hours in advance to send orders. ° °If you have any questions regarding directions or hours of operation,  °please call [phone].   °As a reminder, please drink plenty of water prior to coming for your lab work. Thanks! ° ° °

## 2019-12-29 ENCOUNTER — Other Ambulatory Visit: Payer: Self-pay | Admitting: Family Medicine

## 2019-12-29 DIAGNOSIS — M5430 Sciatica, unspecified side: Secondary | ICD-10-CM

## 2019-12-29 DIAGNOSIS — M2550 Pain in unspecified joint: Secondary | ICD-10-CM

## 2019-12-29 DIAGNOSIS — M549 Dorsalgia, unspecified: Secondary | ICD-10-CM

## 2019-12-30 DIAGNOSIS — E559 Vitamin D deficiency, unspecified: Secondary | ICD-10-CM | POA: Diagnosis not present

## 2019-12-30 DIAGNOSIS — E611 Iron deficiency: Secondary | ICD-10-CM | POA: Diagnosis not present

## 2019-12-30 DIAGNOSIS — Z6841 Body Mass Index (BMI) 40.0 and over, adult: Secondary | ICD-10-CM | POA: Diagnosis not present

## 2019-12-30 DIAGNOSIS — I10 Essential (primary) hypertension: Secondary | ICD-10-CM | POA: Diagnosis not present

## 2019-12-30 DIAGNOSIS — N951 Menopausal and female climacteric states: Secondary | ICD-10-CM | POA: Diagnosis not present

## 2019-12-30 DIAGNOSIS — E038 Other specified hypothyroidism: Secondary | ICD-10-CM | POA: Diagnosis not present

## 2020-01-04 ENCOUNTER — Other Ambulatory Visit: Payer: Self-pay

## 2020-01-04 ENCOUNTER — Other Ambulatory Visit: Payer: Self-pay | Admitting: Rheumatology

## 2020-01-04 DIAGNOSIS — M0609 Rheumatoid arthritis without rheumatoid factor, multiple sites: Secondary | ICD-10-CM

## 2020-01-04 MED ORDER — GABAPENTIN 300 MG PO CAPS: 300.0000 mg | ORAL_CAPSULE | Freq: Every day | ORAL | 0 refills | Status: DC

## 2020-01-04 NOTE — Telephone Encounter (Signed)
Last Visit: 12/21/2019 Next Visit: 03/30/2020  Okay to refill per Dr. Corliss Skains

## 2020-01-13 DIAGNOSIS — R7303 Prediabetes: Secondary | ICD-10-CM | POA: Diagnosis not present

## 2020-01-13 DIAGNOSIS — Z6841 Body Mass Index (BMI) 40.0 and over, adult: Secondary | ICD-10-CM | POA: Diagnosis not present

## 2020-01-21 ENCOUNTER — Other Ambulatory Visit: Payer: Self-pay | Admitting: Family Medicine

## 2020-01-24 ENCOUNTER — Other Ambulatory Visit: Payer: Self-pay | Admitting: Rheumatology

## 2020-01-24 DIAGNOSIS — M0609 Rheumatoid arthritis without rheumatoid factor, multiple sites: Secondary | ICD-10-CM

## 2020-01-24 NOTE — Telephone Encounter (Signed)
Last Visit: 12/21/2019 Next Visit: 03/30/2020 Labs: 10/22/2019 CBC and CMP are stable.  Current Dose per office note on 12/21/2019: MTX 1.0 ml sq injections once weekly Dx: Rheumatoid arthritis of multiple sites with negative rheumatoid factor   Patient advised she is due to update labs. Patient will try to update this week.   Okay to refill MTX?

## 2020-01-26 DIAGNOSIS — I10 Essential (primary) hypertension: Secondary | ICD-10-CM | POA: Diagnosis not present

## 2020-01-26 DIAGNOSIS — Z6841 Body Mass Index (BMI) 40.0 and over, adult: Secondary | ICD-10-CM | POA: Diagnosis not present

## 2020-02-04 ENCOUNTER — Other Ambulatory Visit: Payer: Self-pay

## 2020-02-04 ENCOUNTER — Telehealth: Payer: Self-pay

## 2020-02-04 DIAGNOSIS — Z79899 Other long term (current) drug therapy: Secondary | ICD-10-CM

## 2020-02-04 NOTE — Telephone Encounter (Signed)
Lab orders released for [facility].  

## 2020-02-04 NOTE — Telephone Encounter (Signed)
Patient called requesting labwork orders be sent to [facility] on [address] in [place].

## 2020-02-06 LAB — CMP14+EGFR
ALT: 8 IU/L (ref 0–32)
AST: 13 IU/L (ref 0–40)
Albumin/Globulin Ratio: 1.2 (ref 1.2–2.2)
Albumin: 4.2 g/dL (ref 3.8–4.9)
Alkaline Phosphatase: 88 IU/L (ref 44–121)
BUN/Creatinine Ratio: 22 (ref 9–23)
BUN: 15 mg/dL (ref 6–24)
Bilirubin Total: 0.6 mg/dL (ref 0.0–1.2)
CO2: 24 mmol/L (ref 20–29)
Calcium: 9.6 mg/dL (ref 8.7–10.2)
Chloride: 100 mmol/L (ref 96–106)
Creatinine, Ser: 0.69 mg/dL (ref 0.57–1.00)
GFR calc Af Amer: 112 mL/min/{1.73_m2} (ref >59)
GFR calc non Af Amer: 97 mL/min/{1.73_m2} (ref >59)
Globulin, Total: 3.4 g/dL (ref 1.5–4.5)
Glucose: 98 mg/dL (ref 65–99)
Potassium: 4.3 mmol/L (ref 3.5–5.2)
Sodium: 141 mmol/L (ref 134–144)
Total Protein: 7.6 g/dL (ref 6.0–8.5)

## 2020-02-06 LAB — CBC WITH DIFFERENTIAL/PLATELET
Basophils Absolute: 0.1 10*3/uL (ref 0.0–0.2)
Basos: 1 %
EOS (ABSOLUTE): 0.1 10*3/uL (ref 0.0–0.4)
Eos: 1 %
Hematocrit: 36.7 % (ref 34.0–46.6)
Hemoglobin: 11.5 g/dL (ref 11.1–15.9)
Immature Grans (Abs): 0 10*3/uL (ref 0.0–0.1)
Immature Granulocytes: 0 %
Lymphocytes Absolute: 2.9 10*3/uL (ref 0.7–3.1)
Lymphs: 32 %
MCH: 25.1 pg — ABNORMAL LOW (ref 26.6–33.0)
MCHC: 31.3 g/dL — ABNORMAL LOW (ref 31.5–35.7)
MCV: 80 fL (ref 79–97)
Monocytes Absolute: 0.5 10*3/uL (ref 0.1–0.9)
Monocytes: 5 %
Neutrophils Absolute: 5.6 10*3/uL (ref 1.4–7.0)
Neutrophils: 61 %
Platelets: 428 10*3/uL (ref 150–450)
RBC: 4.59 x10E6/uL (ref 3.77–5.28)
RDW: 14.7 % (ref 11.7–15.4)
WBC: 9.2 10*3/uL (ref 3.4–10.8)

## 2020-02-07 NOTE — Progress Notes (Signed)
CBC and CMP are normal.

## 2020-02-15 DIAGNOSIS — Z6841 Body Mass Index (BMI) 40.0 and over, adult: Secondary | ICD-10-CM | POA: Diagnosis not present

## 2020-02-15 DIAGNOSIS — D509 Iron deficiency anemia, unspecified: Secondary | ICD-10-CM | POA: Diagnosis not present

## 2020-02-15 DIAGNOSIS — E038 Other specified hypothyroidism: Secondary | ICD-10-CM | POA: Diagnosis not present

## 2020-02-15 DIAGNOSIS — I10 Essential (primary) hypertension: Secondary | ICD-10-CM | POA: Diagnosis not present

## 2020-02-15 DIAGNOSIS — R7989 Other specified abnormal findings of blood chemistry: Secondary | ICD-10-CM | POA: Diagnosis not present

## 2020-02-16 ENCOUNTER — Other Ambulatory Visit: Payer: Self-pay | Admitting: Family Medicine

## 2020-02-16 DIAGNOSIS — I10 Essential (primary) hypertension: Secondary | ICD-10-CM

## 2020-02-22 DIAGNOSIS — D509 Iron deficiency anemia, unspecified: Secondary | ICD-10-CM | POA: Diagnosis not present

## 2020-02-22 DIAGNOSIS — E038 Other specified hypothyroidism: Secondary | ICD-10-CM | POA: Diagnosis not present

## 2020-02-22 DIAGNOSIS — N951 Menopausal and female climacteric states: Secondary | ICD-10-CM | POA: Diagnosis not present

## 2020-02-22 DIAGNOSIS — Z6841 Body Mass Index (BMI) 40.0 and over, adult: Secondary | ICD-10-CM | POA: Diagnosis not present

## 2020-02-23 ENCOUNTER — Other Ambulatory Visit: Payer: Self-pay

## 2020-02-23 ENCOUNTER — Encounter: Payer: Self-pay | Admitting: Family Medicine

## 2020-02-23 ENCOUNTER — Ambulatory Visit (INDEPENDENT_AMBULATORY_CARE_PROVIDER_SITE_OTHER): Payer: Medicare HMO | Admitting: Family Medicine

## 2020-02-23 VITALS — BP 118/76 | HR 64 | Resp 16 | Ht 62.0 in | Wt 323.0 lb

## 2020-02-23 DIAGNOSIS — R7301 Impaired fasting glucose: Secondary | ICD-10-CM

## 2020-02-23 DIAGNOSIS — D539 Nutritional anemia, unspecified: Secondary | ICD-10-CM

## 2020-02-23 DIAGNOSIS — I10 Essential (primary) hypertension: Secondary | ICD-10-CM | POA: Diagnosis not present

## 2020-02-23 DIAGNOSIS — Z13220 Encounter for screening for lipoid disorders: Secondary | ICD-10-CM | POA: Diagnosis not present

## 2020-02-23 DIAGNOSIS — E559 Vitamin D deficiency, unspecified: Secondary | ICD-10-CM | POA: Diagnosis not present

## 2020-02-23 MED ORDER — PHENTERMINE HCL 37.5 MG PO TABS: 37.5000 mg | ORAL_TABLET | Freq: Every day | ORAL | 2 refills | Status: DC

## 2020-02-23 NOTE — Patient Instructions (Addendum)
Change f/u to first week in March, cancel February, call if you need me sooner  Congrats on 7 pound weight loss, goal is 4 pounds per month Fasting lipid, TSH. Vit D , HBA1C, iron and vit B 12  1 week before visit in March  STOP drinking calories, and eat ion schedule, stopping at 7 pm  It is important that you exercise regularly at least 30 minutes 5 times a week. If you develop chest pain, have severe difficulty breathing, or feel very tired, stop exercising immediately and seek medical attention   Think about what you will eat, plan ahead. Choose " clean, green, fresh or frozen" over canned, processed or packaged foods which are more sugary, salty and fatty. 70 to 75% of food eaten should be vegetables and fruit. Three meals at set times with snacks allowed between meals, but they must be fruit or vegetables. Aim to eat over a 12 hour period , example 7 am to 7 pm, and STOP after  your last meal of the day. Drink water,generally about 64 ounces per day, no other drink is as healthy. Fruit juice is best enjoyed in a healthy way, by EATING the fruit.  Thanks for choosing [facility], we consider it a privelige to serve you.

## 2020-02-27 ENCOUNTER — Encounter: Payer: Self-pay | Admitting: Family Medicine

## 2020-02-27 NOTE — Progress Notes (Signed)
   [name]     MRN: [id number]      DOB: 1960-07-12   HPI [name] is here for follow up and re-evaluation of chronic medical conditions, medication management and review of any available recent lab and radiology data.  Preventive health is updated, specifically  Cancer screening and Immunization.   Questions or concerns regarding consultations or procedures which the PT has had in the interim are  addressed. The PT denies any adverse reactions to current medications since the last visit.  There are no new concerns.  There are no specific complaints   ROS Denies recent fever or chills. Denies sinus pressure, nasal congestion, ear pain or sore throat. Denies chest congestion, productive cough or wheezing. Denies chest pains, palpitations and leg swelling Denies abdominal pain, nausea, vomiting,diarrhea or constipation.   Denies dysuria, frequency, hesitancy or incontinence. Denies joint pain, swelling and limitation in mobility. Denies headaches, seizures, numbness, or tingling. Denies depression, anxiety or insomnia. Denies skin break down or rash.   PE  BP 118/76   Pulse 64   Resp 16   Ht 5\' 2"  (1.575 m)   Wt (!) 323 lb (146.5 kg)   SpO2 99%   BMI 59.08 kg/m   Patient alert and oriented and in no cardiopulmonary distress.  HEENT: No facial asymmetry, EOMI,     Neck supple .  Chest: Clear to auscultation bilaterally.  CVS: S1, S2 no murmurs, no S3.Regular rate.  ABD: Soft non tender.   Ext: No edema  MS: Adequate ROM spine, shoulders, hips and knees.  Skin: Intact, no ulcerations or rash noted.  Psych: Good eye contact, normal affect. Memory intact not anxious or depressed appearing.  CNS: CN 2-12 intact, power,  normal throughout.no focal deficits noted.   Assessment & Plan  Morbid obesity   Patient re-educated about  the importance of commitment to a  minimum of 150 minutes of exercise per week as able.  The importance of healthy food  choices with portion control discussed, as well as eating regularly and within a 12 hour window most days. The need to choose "clean , green" food 50 to 75% of the time is discussed, as well as to make water the primary drink and set a goal of 64 ounces water daily.    Weight /BMI 02/23/2020 12/21/2019 12/09/2019  WEIGHT 323 lb 330 lb 12.8 oz 327 lb  HEIGHT 5\' 2"  5\' 2"  5\' 2"   BMI 59.08 kg/m2 60.5 kg/m2 59.81 kg/m2  Some encounter information is confidential and restricted. Go to Review Flowsheets activity to see all data.

## 2020-02-27 NOTE — Assessment & Plan Note (Signed)
° °  Patient re-educated about  the importance of commitment to a  minimum of 150 minutes of exercise per week as able.  The importance of healthy food choices with portion control discussed, as well as eating regularly and within a 12 hour window most days. The need to choose "clean , green" food 50 to 75% of the time is discussed, as well as to make water the primary drink and set a goal of 64 ounces water daily.    Weight /BMI 02/23/2020 12/21/2019 12/09/2019  WEIGHT 323 lb 330 lb 12.8 oz 327 lb  HEIGHT 5\' 2"  5\' 2"  5\' 2"   BMI 59.08 kg/m2 60.5 kg/m2 59.81 kg/m2  Some encounter information is confidential and restricted. Go to Review Flowsheets activity to see all data.

## 2020-03-06 ENCOUNTER — Telehealth: Payer: Self-pay | Admitting: Pharmacist

## 2020-03-06 NOTE — Telephone Encounter (Signed)
Received notification from [facility] regarding a prior authorization for [facility] ER tabs. Authorization has been APPROVED from 03/18/20 to 03/17/21.   Phone # [phone]  Chesley Mires, PharmD, MPH Clinical Pharmacist (Rheumatology and Pulmonology)

## 2020-03-12 ENCOUNTER — Other Ambulatory Visit: Payer: Self-pay | Admitting: Rheumatology

## 2020-03-12 DIAGNOSIS — M0609 Rheumatoid arthritis without rheumatoid factor, multiple sites: Secondary | ICD-10-CM

## 2020-03-14 DIAGNOSIS — R52 Pain, unspecified: Secondary | ICD-10-CM | POA: Diagnosis not present

## 2020-03-14 DIAGNOSIS — J020 Streptococcal pharyngitis: Secondary | ICD-10-CM | POA: Diagnosis not present

## 2020-03-14 DIAGNOSIS — R6883 Chills (without fever): Secondary | ICD-10-CM | POA: Diagnosis not present

## 2020-03-14 DIAGNOSIS — R509 Fever, unspecified: Secondary | ICD-10-CM | POA: Diagnosis not present

## 2020-03-14 DIAGNOSIS — J029 Acute pharyngitis, unspecified: Secondary | ICD-10-CM | POA: Diagnosis not present

## 2020-03-15 ENCOUNTER — Other Ambulatory Visit: Payer: Self-pay

## 2020-03-15 ENCOUNTER — Telehealth (INDEPENDENT_AMBULATORY_CARE_PROVIDER_SITE_OTHER): Payer: Medicare HMO | Admitting: Nurse Practitioner

## 2020-03-15 ENCOUNTER — Encounter: Payer: Self-pay | Admitting: Nurse Practitioner

## 2020-03-15 DIAGNOSIS — R059 Cough, unspecified: Secondary | ICD-10-CM | POA: Insufficient documentation

## 2020-03-15 MED ORDER — HYDROCODONE-HOMATROPINE 5-1.5 MG/5ML PO SYRP
5.0000 mL | ORAL_SOLUTION | Freq: Four times a day (QID) | ORAL | 0 refills | Status: DC | PRN
Start: 2020-03-15 — End: 2020-04-11

## 2020-03-15 NOTE — Assessment & Plan Note (Signed)
-  related to strep infection -grandchild has COVID, and COVID test is pending -Rx. Hycodan cough syrup -strep treated with z-pack from urgent care
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
## 2020-03-21 DIAGNOSIS — U071 COVID-19: Secondary | ICD-10-CM | POA: Insufficient documentation

## 2020-03-22 DIAGNOSIS — U071 COVID-19: Secondary | ICD-10-CM | POA: Diagnosis not present

## 2020-03-22 DIAGNOSIS — Z23 Encounter for immunization: Secondary | ICD-10-CM | POA: Diagnosis not present

## 2020-03-29 ENCOUNTER — Other Ambulatory Visit: Payer: Self-pay

## 2020-03-29 DIAGNOSIS — M0609 Rheumatoid arthritis without rheumatoid factor, multiple sites: Secondary | ICD-10-CM

## 2020-03-29 MED ORDER — METHOTREXATE SODIUM CHEMO INJECTION 50 MG/2ML: 25.0000 mg | INTRAMUSCULAR | 0 refills | Status: DC
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
## 2020-03-29 NOTE — Telephone Encounter (Signed)
Last Visit: 12/21/2019 Next Visit: 04/11/2020 Labs: 02/04/2020, CBC and CMP are normal.  Current Dose per office note 12/21/2019, MTX 1.0 ml sq injections once weekly DX: Rheumatoid arthritis of multiple sites with negative rheumatoid factor   Okay to refill MTX?

## 2020-03-29 NOTE — Telephone Encounter (Signed)
Patient called requesting prescription refill of Methotrexate to be sent to [facility] at [address] in [place].

## 2020-03-30 ENCOUNTER — Ambulatory Visit: Payer: Medicare HMO | Admitting: Rheumatology

## 2020-04-11 ENCOUNTER — Ambulatory Visit (INDEPENDENT_AMBULATORY_CARE_PROVIDER_SITE_OTHER): Payer: Medicare HMO | Admitting: Rheumatology

## 2020-04-11 ENCOUNTER — Encounter: Payer: Self-pay | Admitting: Rheumatology

## 2020-04-11 ENCOUNTER — Other Ambulatory Visit: Payer: Self-pay

## 2020-04-11 VITALS — BP 122/78 | HR 62 | Resp 18 | Ht 62.0 in | Wt 323.8 lb

## 2020-04-11 DIAGNOSIS — Z8719 Personal history of other diseases of the digestive system: Secondary | ICD-10-CM

## 2020-04-11 DIAGNOSIS — Z8261 Family history of arthritis: Secondary | ICD-10-CM | POA: Diagnosis not present

## 2020-04-11 DIAGNOSIS — Z96653 Presence of artificial knee joint, bilateral: Secondary | ICD-10-CM

## 2020-04-11 DIAGNOSIS — M19071 Primary osteoarthritis, right ankle and foot: Secondary | ICD-10-CM | POA: Diagnosis not present

## 2020-04-11 DIAGNOSIS — I5032 Chronic diastolic (congestive) heart failure: Secondary | ICD-10-CM

## 2020-04-11 DIAGNOSIS — Z79899 Other long term (current) drug therapy: Secondary | ICD-10-CM | POA: Diagnosis not present

## 2020-04-11 DIAGNOSIS — E890 Postprocedural hypothyroidism: Secondary | ICD-10-CM

## 2020-04-11 DIAGNOSIS — E559 Vitamin D deficiency, unspecified: Secondary | ICD-10-CM | POA: Diagnosis not present

## 2020-04-11 DIAGNOSIS — Z8659 Personal history of other mental and behavioral disorders: Secondary | ICD-10-CM

## 2020-04-11 DIAGNOSIS — Z8669 Personal history of other diseases of the nervous system and sense organs: Secondary | ICD-10-CM

## 2020-04-11 DIAGNOSIS — M19041 Primary osteoarthritis, right hand: Secondary | ICD-10-CM | POA: Diagnosis not present

## 2020-04-11 DIAGNOSIS — M0609 Rheumatoid arthritis without rheumatoid factor, multiple sites: Secondary | ICD-10-CM | POA: Diagnosis not present

## 2020-04-11 DIAGNOSIS — M5030 Other cervical disc degeneration, unspecified cervical region: Secondary | ICD-10-CM

## 2020-04-11 DIAGNOSIS — Z7189 Other specified counseling: Secondary | ICD-10-CM

## 2020-04-11 DIAGNOSIS — R7303 Prediabetes: Secondary | ICD-10-CM

## 2020-04-11 DIAGNOSIS — M19042 Primary osteoarthritis, left hand: Secondary | ICD-10-CM

## 2020-04-11 DIAGNOSIS — M19072 Primary osteoarthritis, left ankle and foot: Secondary | ICD-10-CM

## 2020-04-11 DIAGNOSIS — U071 COVID-19: Secondary | ICD-10-CM

## 2020-04-11 DIAGNOSIS — I10 Essential (primary) hypertension: Secondary | ICD-10-CM

## 2020-04-11 NOTE — Patient Instructions (Addendum)
Standing Labs We placed an order today for your standing lab work.   Please have your standing labs drawn in February and every 3 months  If possible, please have your labs drawn 2 weeks prior to your appointment so that the provider can discuss your results at your appointment.  We have open lab daily Monday through Thursday from 8:30-12:30 PM and 1:30-4:30 PM and Friday from 8:30-12:30 PM and 1:30-4:00 PM at the office of Dr. Fynlee Rowlands, [facility].   Please be advised, patients with office appointments requiring lab work will take precedents over walk-in lab work.  If possible, please come for your lab work on Monday and Friday afternoons, as you may experience shorter wait times. The office is located at [address], [address], [place], [place] [address] No appointment is necessary.   Labs are drawn by [facility]. Please bring your co-pay at the time of your lab draw.  You may receive a bill from [facility] for your lab work.  If you wish to have your labs drawn at another location, please call the office 24 hours in advance to send orders.  If you have any questions regarding directions or hours of operation,  please call [phone].   As a reminder, please drink plenty of water prior to coming for your lab work. Thanks!  COVID-19 vaccine recommendations:   COVID-19 vaccine is recommended for everyone (unless you are allergic to a vaccine component), even if you are on a medication that suppresses your immune system.   If you are on Methotrexate, Cellcept (mycophenolate), Rinvoq, Xeljanz, and Olumiant- hold the medication for 1 week after each vaccine. Hold Methotrexate for 2 weeks after the single dose COVID-19 vaccine.   Do not take Tylenol or any anti-inflammatory medications (NSAIDs) 24 hours prior to the COVID-19 vaccination.   There is no direct evidence about the efficacy of the COVID-19 vaccine in individuals who are on medications that suppress the immune  system.   Even if you are fully vaccinated, and you are on any medications that suppress your immune system, please continue to wear a mask, maintain at least six feet social distance and practice hand hygiene.   If you develop a COVID-19 infection, please contact your PCP or our office to determine if you need monoclonal antibody infusion.  The booster vaccine is now available for immunocompromised patients.   Please see the following web sites for updated information.   [email or web address]     

## 2020-04-19 ENCOUNTER — Ambulatory Visit: Payer: Medicare HMO | Admitting: Family Medicine

## 2020-05-22 ENCOUNTER — Telehealth: Payer: Self-pay

## 2020-05-22 DIAGNOSIS — Z13220 Encounter for screening for lipoid disorders: Secondary | ICD-10-CM | POA: Diagnosis not present

## 2020-05-22 DIAGNOSIS — M0609 Rheumatoid arthritis without rheumatoid factor, multiple sites: Secondary | ICD-10-CM

## 2020-05-22 DIAGNOSIS — I10 Essential (primary) hypertension: Secondary | ICD-10-CM | POA: Diagnosis not present

## 2020-05-22 DIAGNOSIS — R7301 Impaired fasting glucose: Secondary | ICD-10-CM | POA: Diagnosis not present

## 2020-05-22 DIAGNOSIS — D539 Nutritional anemia, unspecified: Secondary | ICD-10-CM | POA: Diagnosis not present

## 2020-05-22 DIAGNOSIS — E559 Vitamin D deficiency, unspecified: Secondary | ICD-10-CM | POA: Diagnosis not present

## 2020-05-22 NOTE — Telephone Encounter (Signed)
FYI

## 2020-05-22 NOTE — Telephone Encounter (Signed)
Patient called stating [employer] [facility] notified her that a new prescription is needed for her prescription refill of Rinvoq and to send it to [facility].  Patient states she will come by the office this afternoon, 05/22/20 to have labwork.

## 2020-05-23 LAB — LIPID PANEL
Chol/HDL Ratio: 3 ratio (ref 0.0–4.4)
Cholesterol, Total: 157 mg/dL (ref 100–199)
HDL: 52 mg/dL (ref >39)
LDL Chol Calc (NIH): 93 mg/dL (ref 0–99)
Triglycerides: 62 mg/dL (ref 0–149)
VLDL Cholesterol Cal: 12 mg/dL (ref 5–40)

## 2020-05-23 LAB — HEMOGLOBIN A1C
Est. average glucose Bld gHb Est-mCnc: 128 mg/dL
Hgb A1c MFr Bld: 6.1 % — ABNORMAL HIGH (ref 4.8–5.6)

## 2020-05-23 LAB — TSH: TSH: 3.26 u[IU]/mL (ref 0.450–4.500)

## 2020-05-23 LAB — VITAMIN D 25 HYDROXY (VIT D DEFICIENCY, FRACTURES): Vit D, 25-Hydroxy: 54.8 ng/mL (ref 30.0–100.0)

## 2020-05-23 LAB — IRON: Iron: 35 ug/dL (ref 27–159)

## 2020-05-23 LAB — VITAMIN B12: Vitamin B-12: 1773 pg/mL — ABNORMAL HIGH (ref 232–1245)

## 2020-05-23 NOTE — Telephone Encounter (Signed)
Called patient to notify that labs that were drawn yesterday were not the ones that we needed. She plans to stop by tomorrow afternoon to have CBC and CMP drawn d/t high-risk med use (Rinvoq and MTX).  Chesley Mires, PharmD, MPH Clinical Pharmacist (Rheumatology and Pulmonology)

## 2020-05-24 ENCOUNTER — Other Ambulatory Visit: Payer: Self-pay | Admitting: *Deleted

## 2020-05-24 DIAGNOSIS — Z79899 Other long term (current) drug therapy: Secondary | ICD-10-CM

## 2020-05-25 LAB — CBC WITH DIFFERENTIAL/PLATELET
Absolute Monocytes: 494 cells/uL (ref 200–950)
Basophils Absolute: 18 cells/uL (ref 0–200)
Basophils Relative: 0.3 %
Eosinophils Absolute: 61 cells/uL (ref 15–500)
Eosinophils Relative: 1 %
HCT: 42.5 % (ref 35.0–45.0)
Hemoglobin: 13.5 g/dL (ref 11.7–15.5)
Lymphs Abs: 2983 cells/uL (ref 850–3900)
MCH: 25.4 pg — ABNORMAL LOW (ref 27.0–33.0)
MCHC: 31.8 g/dL — ABNORMAL LOW (ref 32.0–36.0)
MCV: 79.9 fL — ABNORMAL LOW (ref 80.0–100.0)
MPV: 9.9 fL (ref 7.5–12.5)
Monocytes Relative: 8.1 %
Neutro Abs: 2544 cells/uL (ref 1500–7800)
Neutrophils Relative %: 41.7 %
Platelets: 401 10*3/uL — ABNORMAL HIGH (ref 140–400)
RBC: 5.32 10*6/uL — ABNORMAL HIGH (ref 3.80–5.10)
RDW: 13.6 % (ref 11.0–15.0)
Total Lymphocyte: 48.9 %
WBC: 6.1 10*3/uL (ref 3.8–10.8)

## 2020-05-25 LAB — COMPLETE METABOLIC PANEL WITH GFR
AG Ratio: 1.1 (calc) (ref 1.0–2.5)
ALT: 11 U/L (ref 6–29)
AST: 14 U/L (ref 10–35)
Albumin: 4.4 g/dL (ref 3.6–5.1)
Alkaline phosphatase (APISO): 86 U/L (ref 37–153)
BUN: 17 mg/dL (ref 7–25)
CO2: 27 mmol/L (ref 20–32)
Calcium: 10.1 mg/dL (ref 8.6–10.4)
Chloride: 99 mmol/L (ref 98–110)
Creat: 0.77 mg/dL (ref 0.50–1.05)
GFR, Est African American: 99 mL/min/{1.73_m2} (ref >=60)
GFR, Est Non African American: 86 mL/min/{1.73_m2} (ref >=60)
Globulin: 3.9 g/dL (calc) — ABNORMAL HIGH (ref 1.9–3.7)
Glucose, Bld: 92 mg/dL (ref 65–99)
Potassium: 4.7 mmol/L (ref 3.5–5.3)
Sodium: 136 mmol/L (ref 135–146)
Total Bilirubin: 0.7 mg/dL (ref 0.2–1.2)
Total Protein: 8.3 g/dL — ABNORMAL HIGH (ref 6.1–8.1)

## 2020-05-25 NOTE — Progress Notes (Signed)
CBC and CMP are stable.  Please advise multivitamin with iron.

## 2020-05-26 MED ORDER — RINVOQ 15 MG PO TB24: 15.0000 mg | ORAL_TABLET | Freq: Every day | ORAL | 0 refills | Status: DC

## 2020-05-26 NOTE — Telephone Encounter (Signed)
Labs drawn 05/24/20. CBC and CMP are stable  Rx for 90 days of Rinvoq 15mg  once daily sent to [facility]

## 2020-05-26 NOTE — Addendum Note (Signed)
Addended by: Murrell Redden on: 05/26/2020 12:36 PM   Modules accepted: Orders

## 2020-05-29 ENCOUNTER — Telehealth (INDEPENDENT_AMBULATORY_CARE_PROVIDER_SITE_OTHER): Payer: Medicare HMO | Admitting: Family Medicine

## 2020-05-29 ENCOUNTER — Encounter: Payer: Self-pay | Admitting: Family Medicine

## 2020-05-29 ENCOUNTER — Other Ambulatory Visit: Payer: Self-pay

## 2020-05-29 VITALS — Ht 62.0 in | Wt 319.0 lb

## 2020-05-29 DIAGNOSIS — I10 Essential (primary) hypertension: Secondary | ICD-10-CM

## 2020-05-29 DIAGNOSIS — M0600 Rheumatoid arthritis without rheumatoid factor, unspecified site: Secondary | ICD-10-CM | POA: Diagnosis not present

## 2020-05-29 DIAGNOSIS — Z1231 Encounter for screening mammogram for malignant neoplasm of breast: Secondary | ICD-10-CM

## 2020-05-29 MED ORDER — HYDROXYZINE HCL 50 MG PO TABS: ORAL_TABLET | ORAL | 3 refills | Status: DC

## 2020-05-29 MED ORDER — PHENTERMINE HCL 37.5 MG PO TABS: 37.5000 mg | ORAL_TABLET | Freq: Every day | ORAL | 2 refills | Status: DC

## 2020-05-29 NOTE — Progress Notes (Signed)
Virtual Visit via Telephone Note  I connected with [name] on 05/29/20 at  4:00 PM EDT by telephone and verified that I am speaking with the correct person using two identifiers.  Location: Patient: home Provider: office   I discussed the limitations, risks, security and privacy concerns of performing an evaluation and management service by telephone and the availability of in person appointments. I also discussed with the patient that there may be a patient responsible charge related to this service. The patient expressed understanding and agreed to proceed.   History of Present Illness: F/U chronic problems and address any new or current concerns. Review and update medications and allergies. Review recent lab and radiologic data . Update routine health maintainace. Review an encourage improved health habits to include nutrition, exercise and  sleep . Working on weight loss, needs to be more diligent with food choice and exercise  Denies recent fever or chills. Denies sinus pressure, nasal congestion, ear pain or sore throat. Denies chest congestion, productive cough or wheezing. Denies chest pains, palpitations and leg swelling Denies abdominal pain, nausea, vomiting,diarrhea or constipation.   Denies dysuria, frequency, hesitancy or incontinence. Denies uncontrolled  joint pain, swelling and limitation in mobility.Being managed by Rheumatology Denies headaches, seizures, numbness, or tingling. Denies uncontrolled depression, anxiety or insomnia. Denies skin break down or rash.       Observations/Objective: Ht 5\' 2"  (1.575 m)   Wt (!) 319 lb (144.7 kg)   BMI 58.35 kg/m  Good communication with no confusion and intact memory. Alert and oriented x 3 No signs of respiratory distress during speech    Assessment and Plan: Essential hypertension Controlled, no change in medication DASH diet and commitment to daily physical activity for a minimum of 30 minutes  discussed and encouraged, as a part of hypertension management. The importance of attaining a healthy weight is also discussed.  BP/Weight 05/29/2020 04/11/2020 02/23/2020 12/21/2019 12/09/2019 11/30/2019 11/04/2019  Systolic BP - 122 118 109 119 11/06/2019 132  Diastolic BP - 78 76 69 73 77 77  Wt. (Lbs) 319 323.8 323 330.8 327 324 331  BMI 58.35 59.22 59.08 60.5 59.81 59.26 60.54  Some encounter information is confidential and restricted. Go to Review Flowsheets activity to see all data.       Morbid obesity  Patient re-educated about  the importance of commitment to a  minimum of 150 minutes of exercise per week as able.  The importance of healthy food choices with portion control discussed, as well as eating regularly and within a 12 hour window most days. The need to choose "clean , green" food 50 to 75% of the time is discussed, as well as to make water the primary drink and set a goal of 64 ounces water daily.    Weight /BMI 05/29/2020 04/11/2020 02/23/2020  WEIGHT 319 lb 323 lb 12.8 oz 323 lb  HEIGHT 5\' 2"  5\' 2"  5\' 2"   BMI 58.35 kg/m2 59.22 kg/m2 59.08 kg/m2  Some encounter information is confidential and restricted. Go to Review Flowsheets activity to see all data.      Rheumatoid arthritis with negative rheumatoid factor (HCC) manged by Endo, on methotrexate    Follow Up Instructions:    I discussed the assessment and treatment plan with the patient. The patient was provided an opportunity to ask questions and all were answered. The patient agreed with the plan and demonstrated an understanding of the instructions.   The patient was advised to call back or seek  an in-person evaluation if the symptoms worsen or if the condition fails to improve as anticipated.  I provided 14 minutes of non-face-to-face time during this encounter.   [name], MD

## 2020-05-29 NOTE — Patient Instructions (Addendum)
F/U in 3 months, phone , re evaluate weight, call if you need me sooner   Plesae schedule July mammogram appointment at checkout, morning please as she works at [employer] are excellent , except that blood sugar has increased, so you need to reduce sweets and starches  Please reduce / stop B12 supplement for the next 3 to 4 months as your level is above the normal range  Congrats on 4 pound weight loss, keep this up!   Calorie Counting for Weight Loss Calories are units of energy. Your body needs a certain number of calories from food to keep going throughout the day. When you eat or drink more calories than your body needs, your body stores the extra calories mostly as fat. When you eat or drink fewer calories than your body needs, your body burns fat to get the energy it needs. Calorie counting means keeping track of how many calories you eat and drink each day. Calorie counting can be helpful if you need to lose weight. If you eat fewer calories than your body needs, you should lose weight. Ask your health care provider what a healthy weight is for you. For calorie counting to work, you will need to eat the right number of calories each day to lose a healthy amount of weight per week. A dietitian can help you figure out how many calories you need in a day and will suggest ways to reach your calorie goal.  A healthy amount of weight to lose each week is usually 1-2 lb (0.5-0.9 kg). This usually means that your daily calorie intake should be reduced by 500-750 calories.  Eating 1,200-1,500 calories a day can help most women lose weight.  Eating 1,500-1,800 calories a day can help most men lose weight. What do I need to know about calorie counting? Work with your health care provider or dietitian to determine how many calories you should get each day. To meet your daily calorie goal, you will need to:  Find out how many calories are in each food that you would like to eat. Try to do this  before you eat.  Decide how much of the food you plan to eat.  Keep a food log. Do this by writing down what you ate and how many calories it had. To successfully lose weight, it is important to balance calorie counting with a healthy lifestyle that includes regular activity. Where do I find calorie information? The number of calories in a food can be found on a Nutrition Facts label. If a food does not have a Nutrition Facts label, try to look up the calories online or ask your dietitian for help. Remember that calories are listed per serving. If you choose to have more than one serving of a food, you will have to multiply the calories per serving by the number of servings you plan to eat. For example, the label on a package of bread might say that a serving size is 1 slice and that there are 90 calories in a serving. If you eat 1 slice, you will have eaten 90 calories. If you eat 2 slices, you will have eaten 180 calories.   How do I keep a food log? After each time that you eat, record the following in your food log as soon as possible:  What you ate. Be sure to include toppings, sauces, and other extras on the food.  How much you ate. This can be measured in cups, ounces,  or number of items.  How many calories were in each food and drink.  The total number of calories in the food you ate. Keep your food log near you, such as in a pocket-sized notebook or on an app or website on your mobile phone. Some programs will calculate calories for you and show you how many calories you have left to meet your daily goal. What are some portion-control tips?  Know how many calories are in a serving. This will help you know how many servings you can have of a certain food.  Use a measuring cup to measure serving sizes. You could also try weighing out portions on a kitchen scale. With time, you will be able to estimate serving sizes for some foods.  Take time to put servings of different foods on your  favorite plates or in your favorite bowls and cups so you know what a serving looks like.  Try not to eat straight from a food's packaging, such as from a bag or box. Eating straight from the package makes it hard to see how much you are eating and can lead to overeating. Put the amount you would like to eat in a cup or on a plate to make sure you are eating the right portion.  Use smaller plates, glasses, and bowls for smaller portions and to prevent overeating.  Try not to multitask. For example, avoid watching TV or using your computer while eating. If it is time to eat, sit down at a table and enjoy your food. This will help you recognize when you are full. It will also help you be more mindful of what and how much you are eating. What are tips for following this plan? Reading food labels  Check the calorie count compared with the serving size. The serving size may be smaller than what you are used to eating.  Check the source of the calories. Try to choose foods that are high in protein, fiber, and vitamins, and low in saturated fat, trans fat, and sodium. Shopping  Read nutrition labels while you shop. This will help you make healthy decisions about which foods to buy.  Pay attention to nutrition labels for low-fat or fat-free foods. These foods sometimes have the same number of calories or more calories than the full-fat versions. They also often have added sugar, starch, or salt to make up for flavor that was removed with the fat.  Make a grocery list of lower-calorie foods and stick to it. Cooking  Try to cook your favorite foods in a healthier way. For example, try baking instead of frying.  Use low-fat dairy products. Meal planning  Use more fruits and vegetables. One-half of your plate should be fruits and vegetables.  Include lean proteins, such as chicken, [place], and fish. Lifestyle Each week, aim to do one of the following:  150 minutes of moderate exercise, such as  walking.  75 minutes of vigorous exercise, such as running. General information  Know how many calories are in the foods you eat most often. This will help you calculate calorie counts faster.  Find a way of tracking calories that works for you. Get creative. Try different apps or programs if writing down calories does not work for you. What foods should I eat?  Eat nutritious foods. It is better to have a nutritious, high-calorie food, such as an avocado, than a food with few nutrients, such as a bag of potato chips.  Use your calories on  foods and drinks that will fill you up and will not leave you hungry soon after eating. ? Examples of foods that fill you up are nuts and nut butters, vegetables, lean proteins, and high-fiber foods such as whole grains. High-fiber foods are foods with more than 5 g of fiber per serving.  Pay attention to calories in drinks. Low-calorie drinks include water and unsweetened drinks. The items listed above may not be a complete list of foods and beverages you can eat. Contact a dietitian for more information.   What foods should I limit? Limit foods or drinks that are not good sources of vitamins, minerals, or protein or that are high in unhealthy fats. These include:  Candy.  Other sweets.  Sodas, specialty coffee drinks, alcohol, and juice. The items listed above may not be a complete list of foods and beverages you should avoid. Contact a dietitian for more information. How do I count calories when eating out?  Pay attention to portions. Often, portions are much larger when eating out. Try these tips to keep portions smaller: ? Consider sharing a meal instead of getting your own. ? If you get your own meal, eat only half of it. Before you start eating, ask for a container and put half of your meal into it. ? When available, consider ordering smaller portions from the menu instead of full portions.  Pay attention to your food and drink choices.  Knowing the way food is cooked and what is included with the meal can help you eat fewer calories. ? If calories are listed on the menu, choose the lower-calorie options. ? Choose dishes that include vegetables, fruits, whole grains, low-fat dairy products, and lean proteins. ? Choose items that are boiled, broiled, grilled, or steamed. Avoid items that are buttered, battered, fried, or served with cream sauce. Items labeled as crispy are usually fried, unless stated otherwise. ? Choose water, low-fat milk, unsweetened iced tea, or other drinks without added sugar. If you want an alcoholic beverage, choose a lower-calorie option, such as a glass of wine or light beer. ? Ask for dressings, sauces, and syrups on the side. These are usually high in calories, so you should limit the amount you eat. ? If you want a salad, choose a garden salad and ask for grilled meats. Avoid extra toppings such as bacon, cheese, or fried items. Ask for the dressing on the side, or ask for olive oil and vinegar or lemon to use as dressing.  Estimate how many servings of a food you are given. Knowing serving sizes will help you be aware of how much food you are eating at restaurants. Where to find more information  Centers for Disease Control and Prevention: FootballExhibition.com.br  [place] Department of Agriculture: WrestlingReporter.dk Summary  Calorie counting means keeping track of how many calories you eat and drink each day. If you eat fewer calories than your body needs, you should lose weight.  A healthy amount of weight to lose per week is usually 1-2 lb (0.5-0.9 kg). This usually means reducing your daily calorie intake by 500-750 calories.  The number of calories in a food can be found on a Nutrition Facts label. If a food does not have a Nutrition Facts label, try to look up the calories online or ask your dietitian for help.  Use smaller plates, glasses, and bowls for smaller portions and to prevent overeating.  Use your  calories on foods and drinks that will fill you up and not leave  you hungry shortly after a meal. This information is not intended to replace advice given to you by your health care provider. Make sure you discuss any questions you have with your health care provider. Document Revised: 04/15/2019 Document Reviewed: 04/15/2019 Elsevier Patient Education  2021 [employer].

## 2020-06-02 ENCOUNTER — Other Ambulatory Visit: Payer: Self-pay | Admitting: Family Medicine

## 2020-06-02 ENCOUNTER — Encounter: Payer: Self-pay | Admitting: Family Medicine

## 2020-06-02 DIAGNOSIS — M0600 Rheumatoid arthritis without rheumatoid factor, unspecified site: Secondary | ICD-10-CM | POA: Insufficient documentation

## 2020-06-02 NOTE — Assessment & Plan Note (Signed)
  Patient re-educated about  the importance of commitment to a  minimum of 150 minutes of exercise per week as able.  The importance of healthy food choices with portion control discussed, as well as eating regularly and within a 12 hour window most days. The need to choose "clean , green" food 50 to 75% of the time is discussed, as well as to make water the primary drink and set a goal of 64 ounces water daily.    Weight /BMI 05/29/2020 04/11/2020 02/23/2020  WEIGHT 319 lb 323 lb 12.8 oz 323 lb  HEIGHT 5\' 2"  5\' 2"  5\' 2"   BMI 58.35 kg/m2 59.22 kg/m2 59.08 kg/m2  Some encounter information is confidential and restricted. Go to Review Flowsheets activity to see all data.

## 2020-06-02 NOTE — Assessment & Plan Note (Signed)
manged by Endo, on methotrexate

## 2020-06-02 NOTE — Assessment & Plan Note (Signed)
Controlled, no change in medication DASH diet and commitment to daily physical activity for a minimum of 30 minutes discussed and encouraged, as a part of hypertension management. The importance of attaining a healthy weight is also discussed.  BP/Weight 05/29/2020 04/11/2020 02/23/2020 12/21/2019 12/09/2019 11/30/2019 11/04/2019  Systolic BP - 122 118 109 119 119 132  Diastolic BP - 78 76 69 73 77 77  Wt. (Lbs) 319 323.8 323 330.8 327 324 331  BMI 58.35 59.22 59.08 60.5 59.81 59.26 60.54  Some encounter information is confidential and restricted. Go to Review Flowsheets activity to see all data.

## 2020-06-07 DIAGNOSIS — Z8249 Family history of ischemic heart disease and other diseases of the circulatory system: Secondary | ICD-10-CM | POA: Diagnosis not present

## 2020-06-07 DIAGNOSIS — M069 Rheumatoid arthritis, unspecified: Secondary | ICD-10-CM | POA: Diagnosis not present

## 2020-06-07 DIAGNOSIS — R609 Edema, unspecified: Secondary | ICD-10-CM | POA: Diagnosis not present

## 2020-06-07 DIAGNOSIS — Z79899 Other long term (current) drug therapy: Secondary | ICD-10-CM | POA: Diagnosis not present

## 2020-06-07 DIAGNOSIS — F419 Anxiety disorder, unspecified: Secondary | ICD-10-CM | POA: Diagnosis not present

## 2020-06-07 DIAGNOSIS — R69 Illness, unspecified: Secondary | ICD-10-CM | POA: Diagnosis not present

## 2020-06-07 DIAGNOSIS — M1990 Unspecified osteoarthritis, unspecified site: Secondary | ICD-10-CM | POA: Diagnosis not present

## 2020-06-07 DIAGNOSIS — I10 Essential (primary) hypertension: Secondary | ICD-10-CM | POA: Diagnosis not present

## 2020-06-22 ENCOUNTER — Other Ambulatory Visit: Payer: Self-pay | Admitting: Family Medicine

## 2020-07-13 ENCOUNTER — Other Ambulatory Visit: Payer: Self-pay | Admitting: Family Medicine

## 2020-07-19 ENCOUNTER — Telehealth: Payer: Self-pay | Admitting: *Deleted

## 2020-07-19 NOTE — Telephone Encounter (Signed)
LVM for pt to see if she had eye exam within last year and if so where.

## 2020-07-20 ENCOUNTER — Other Ambulatory Visit: Payer: Self-pay | Admitting: Rheumatology

## 2020-07-20 NOTE — Telephone Encounter (Signed)
Next Visit: 08/08/2020  Last Visit: 04/11/2020  Last Fill: 03/29/2020  DX:  Rheumatoid arthritis of multiple sites with negative rheumatoid factor   Current Dose per office note 04/11/2020, MTX 1.0 ml sq injections once weekly  Labs: 05/24/2020, CBC and CMP are stable. Please advise multivitamin with iron.  Okay to refill MTX?
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
## 2020-08-02 ENCOUNTER — Ambulatory Visit (INDEPENDENT_AMBULATORY_CARE_PROVIDER_SITE_OTHER): Payer: Medicare HMO

## 2020-08-02 ENCOUNTER — Other Ambulatory Visit: Payer: Self-pay

## 2020-08-02 DIAGNOSIS — Z0000 Encounter for general adult medical examination without abnormal findings: Secondary | ICD-10-CM | POA: Diagnosis not present
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
## 2020-08-02 NOTE — Patient Instructions (Signed)
[name] , Thank you for taking time to come for your Medicare Wellness Visit. I appreciate your ongoing commitment to your health goals. Please review the following plan we discussed and let me know if I can assist you in the future.   Screening recommendations/referrals: Colonoscopy: Up to date, next due 03/04/2023 Mammogram: Up to date, next due 10/11/2020 Bone Density: Not due until age 58 Recommended yearly ophthalmology/optometry visit for glaucoma screening and checkup Recommended yearly dental visit for hygiene and checkup  Vaccinations: Influenza vaccine: Up to date, next due fall 2022  Pneumococcal vaccine: Not due until age 73 Tdap vaccine: Up to date, next 11/29/2029 Shingles vaccine: Currently due, you may receive at your local pharmacy    Advanced directives: Advance directive discussed with you today. Even though you declined this today please call our office should you change your mind and we can give you the proper paperwork for you to fill out.   Conditions/risks identified: None   Next appointment: 08/08/2021 @ 10:20 am with [place] 40-64 Years, Female Preventive care refers to lifestyle choices and visits with your health care provider that can promote health and wellness. What does preventive care include?  A yearly physical exam. This is also called an annual well check.  Dental exams once or twice a year.  Routine eye exams. Ask your health care provider how often you should have your eyes checked.  Personal lifestyle choices, including:  Daily care of your teeth and gums.  Regular physical activity.  Eating a healthy diet.  Avoiding tobacco and drug use.  Limiting alcohol use.  Practicing safe sex.  Taking low-dose aspirin daily starting at age 78.  Taking vitamin and mineral supplements as recommended by your health care provider. What happens during an annual well check? The services and screenings done  by your health care provider during your annual well check will depend on your age, overall health, lifestyle risk factors, and family history of disease. Counseling  Your health care provider may ask you questions about your:  Alcohol use.  Tobacco use.  Drug use.  Emotional well-being.  Home and relationship well-being.  Sexual activity.  Eating habits.  Work and work Statistician.  Method of birth control.  Menstrual cycle.  Pregnancy history. Screening  You may have the following tests or measurements:  Height, weight, and BMI.  Blood pressure.  Lipid and cholesterol levels. These may be checked every 5 years, or more frequently if you are over 82 years old.  Skin check.  Lung cancer screening. You may have this screening every year starting at age 47 if you have a 30-pack-year history of smoking and currently smoke or have quit within the past 15 years.  Fecal occult blood test (FOBT) of the stool. You may have this test every year starting at age 20.  Flexible sigmoidoscopy or colonoscopy. You may have a sigmoidoscopy every 5 years or a colonoscopy every 10 years starting at age 12.  Hepatitis C blood test.  Hepatitis B blood test.  Sexually transmitted disease (STD) testing.  Diabetes screening. This is done by checking your blood sugar (glucose) after you have not eaten for a while (fasting). You may have this done every 1-3 years.  Mammogram. This may be done every 1-2 years. Talk to your health care provider about when you should start having regular mammograms. This may depend on whether you have a family history of breast cancer.  BRCA-related cancer screening. This  may be done if you have a family history of breast, ovarian, tubal, or peritoneal cancers.  Pelvic exam and Pap test. This may be done every 3 years starting at age 50. Starting at age 31, this may be done every 5 years if you have a Pap test in combination with an HPV test.  Bone density  scan. This is done to screen for osteoporosis. You may have this scan if you are at high risk for osteoporosis. Discuss your test results, treatment options, and if necessary, the need for more tests with your health care provider. Vaccines  Your health care provider may recommend certain vaccines, such as:  Influenza vaccine. This is recommended every year.  Tetanus, diphtheria, and acellular pertussis (Tdap, Td) vaccine. You may need a Td booster every 10 years.  Zoster vaccine. You may need this after age 70.  Pneumococcal 13-valent conjugate (PCV13) vaccine. You may need this if you have certain conditions and were not previously vaccinated.  Pneumococcal polysaccharide (PPSV23) vaccine. You may need one or two doses if you smoke cigarettes or if you have certain conditions. Talk to your health care provider about which screenings and vaccines you need and how often you need them. This information is not intended to replace advice given to you by your health care provider. Make sure you discuss any questions you have with your health care provider. Document Released: 03/31/2015 Document Revised: 11/22/2015 Document Reviewed: 01/03/2015 Elsevier Interactive Patient Education  2017 [place] Prevention in the Home Falls can cause injuries. They can happen to people of all ages. There are many things you can do to make your home safe and to help prevent falls. What can I do on the outside of my home?  Regularly fix the edges of walkways and driveways and fix any cracks.  Remove anything that might make you trip as you walk through a door, such as a raised step or threshold.  Trim any bushes or trees on the path to your home.  Use bright outdoor lighting.  Clear any walking paths of anything that might make someone trip, such as rocks or tools.  Regularly check to see if handrails are loose or broken. Make sure that both sides of any steps have handrails.  Any raised  decks and porches should have guardrails on the edges.  Have any leaves, snow, or ice cleared regularly.  Use sand or salt on walking paths during winter.  Clean up any spills in your garage right away. This includes oil or grease spills. What can I do in the bathroom?  Use night lights.  Install grab bars by the toilet and in the tub and shower. Do not use towel bars as grab bars.  Use non-skid mats or decals in the tub or shower.  If you need to sit down in the shower, use a plastic, non-slip stool.  Keep the floor dry. Clean up any water that spills on the floor as soon as it happens.  Remove soap buildup in the tub or shower regularly.  Attach bath mats securely with double-sided non-slip rug tape.  Do not have throw rugs and other things on the floor that can make you trip. What can I do in the bedroom?  Use night lights.  Make sure that you have a light by your bed that is easy to reach.  Do not use any sheets or blankets that are too big for your bed. They should not hang down  onto the floor.  Have a firm chair that has side arms. You can use this for support while you get dressed.  Do not have throw rugs and other things on the floor that can make you trip. What can I do in the kitchen?  Clean up any spills right away.  Avoid walking on wet floors.  Keep items that you use a lot in easy-to-reach places.  If you need to reach something above you, use a strong step stool that has a grab bar.  Keep electrical cords out of the way.  Do not use floor polish or wax that makes floors slippery. If you must use wax, use non-skid floor wax.  Do not have throw rugs and other things on the floor that can make you trip. What can I do with my stairs?  Do not leave any items on the stairs.  Make sure that there are handrails on both sides of the stairs and use them. Fix handrails that are broken or loose. Make sure that handrails are as long as the stairways.  Check  any carpeting to make sure that it is firmly attached to the stairs. Fix any carpet that is loose or worn.  Avoid having throw rugs at the top or bottom of the stairs. If you do have throw rugs, attach them to the floor with carpet tape.  Make sure that you have a light switch at the top of the stairs and the bottom of the stairs. If you do not have them, ask someone to add them for you. What else can I do to help prevent falls?  Wear shoes that:  Do not have high heels.  Have rubber bottoms.  Are comfortable and fit you well.  Are closed at the toe. Do not wear sandals.  If you use a stepladder:  Make sure that it is fully opened. Do not climb a closed stepladder.  Make sure that both sides of the stepladder are locked into place.  Ask someone to hold it for you, if possible.  Clearly mark and make sure that you can see:  Any grab bars or handrails.  First and last steps.  Where the edge of each step is.  Use tools that help you move around (mobility aids) if they are needed. These include:  Canes.  Walkers.  Scooters.  Crutches.  Turn on the lights when you go into a dark area. Replace any light bulbs as soon as they burn out.  Set up your furniture so you have a clear path. Avoid moving your furniture around.  If any of your floors are uneven, fix them.  If there are any pets around you, be aware of where they are.  Review your medicines with your doctor. Some medicines can make you feel dizzy. This can increase your chance of falling. Ask your doctor what other things that you can do to help prevent falls. This information is not intended to replace advice given to you by your health care provider. Make sure you discuss any questions you have with your health care provider. Document Released: 12/29/2008 Document Revised: 08/10/2015 Document Reviewed: 04/08/2014 Elsevier Interactive Patient Education  2017 [employer].

## 2020-08-08 ENCOUNTER — Ambulatory Visit: Payer: Medicare HMO | Admitting: Physician Assistant

## 2020-08-08 DIAGNOSIS — U071 COVID-19: Secondary | ICD-10-CM

## 2020-08-08 DIAGNOSIS — Z79899 Other long term (current) drug therapy: Secondary | ICD-10-CM

## 2020-08-08 DIAGNOSIS — I5032 Chronic diastolic (congestive) heart failure: Secondary | ICD-10-CM

## 2020-08-08 DIAGNOSIS — Z8261 Family history of arthritis: Secondary | ICD-10-CM

## 2020-08-08 DIAGNOSIS — M5030 Other cervical disc degeneration, unspecified cervical region: Secondary | ICD-10-CM

## 2020-08-08 DIAGNOSIS — Z8659 Personal history of other mental and behavioral disorders: Secondary | ICD-10-CM

## 2020-08-08 DIAGNOSIS — Z96653 Presence of artificial knee joint, bilateral: Secondary | ICD-10-CM

## 2020-08-08 DIAGNOSIS — M0609 Rheumatoid arthritis without rheumatoid factor, multiple sites: Secondary | ICD-10-CM

## 2020-08-08 DIAGNOSIS — E890 Postprocedural hypothyroidism: Secondary | ICD-10-CM

## 2020-08-08 DIAGNOSIS — I10 Essential (primary) hypertension: Secondary | ICD-10-CM

## 2020-08-08 DIAGNOSIS — M19071 Primary osteoarthritis, right ankle and foot: Secondary | ICD-10-CM

## 2020-08-08 DIAGNOSIS — E559 Vitamin D deficiency, unspecified: Secondary | ICD-10-CM

## 2020-08-08 DIAGNOSIS — R7303 Prediabetes: Secondary | ICD-10-CM

## 2020-08-08 DIAGNOSIS — Z8719 Personal history of other diseases of the digestive system: Secondary | ICD-10-CM

## 2020-08-08 DIAGNOSIS — M19041 Primary osteoarthritis, right hand: Secondary | ICD-10-CM

## 2020-08-08 DIAGNOSIS — Z8669 Personal history of other diseases of the nervous system and sense organs: Secondary | ICD-10-CM
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
## 2020-08-18 ENCOUNTER — Other Ambulatory Visit: Payer: Self-pay

## 2020-08-18 ENCOUNTER — Encounter: Payer: Self-pay | Admitting: Physician Assistant

## 2020-08-18 ENCOUNTER — Ambulatory Visit (INDEPENDENT_AMBULATORY_CARE_PROVIDER_SITE_OTHER): Payer: Medicare HMO | Admitting: Physician Assistant

## 2020-08-18 VITALS — BP 134/80 | HR 55 | Resp 16 | Ht 62.0 in | Wt 332.2 lb

## 2020-08-18 DIAGNOSIS — I10 Essential (primary) hypertension: Secondary | ICD-10-CM

## 2020-08-18 DIAGNOSIS — E890 Postprocedural hypothyroidism: Secondary | ICD-10-CM

## 2020-08-18 DIAGNOSIS — Z8669 Personal history of other diseases of the nervous system and sense organs: Secondary | ICD-10-CM

## 2020-08-18 DIAGNOSIS — Z96653 Presence of artificial knee joint, bilateral: Secondary | ICD-10-CM

## 2020-08-18 DIAGNOSIS — Z8719 Personal history of other diseases of the digestive system: Secondary | ICD-10-CM | POA: Diagnosis not present

## 2020-08-18 DIAGNOSIS — Z8659 Personal history of other mental and behavioral disorders: Secondary | ICD-10-CM

## 2020-08-18 DIAGNOSIS — R7303 Prediabetes: Secondary | ICD-10-CM | POA: Diagnosis not present

## 2020-08-18 DIAGNOSIS — M0609 Rheumatoid arthritis without rheumatoid factor, multiple sites: Secondary | ICD-10-CM | POA: Diagnosis not present

## 2020-08-18 DIAGNOSIS — Z111 Encounter for screening for respiratory tuberculosis: Secondary | ICD-10-CM

## 2020-08-18 DIAGNOSIS — M19071 Primary osteoarthritis, right ankle and foot: Secondary | ICD-10-CM | POA: Diagnosis not present

## 2020-08-18 DIAGNOSIS — E559 Vitamin D deficiency, unspecified: Secondary | ICD-10-CM | POA: Diagnosis not present

## 2020-08-18 DIAGNOSIS — M5030 Other cervical disc degeneration, unspecified cervical region: Secondary | ICD-10-CM | POA: Diagnosis not present

## 2020-08-18 DIAGNOSIS — M19072 Primary osteoarthritis, left ankle and foot: Secondary | ICD-10-CM

## 2020-08-18 DIAGNOSIS — Z79899 Other long term (current) drug therapy: Secondary | ICD-10-CM | POA: Diagnosis not present

## 2020-08-18 DIAGNOSIS — I5032 Chronic diastolic (congestive) heart failure: Secondary | ICD-10-CM

## 2020-08-18 DIAGNOSIS — M19041 Primary osteoarthritis, right hand: Secondary | ICD-10-CM

## 2020-08-18 DIAGNOSIS — Z8261 Family history of arthritis: Secondary | ICD-10-CM | POA: Diagnosis not present

## 2020-08-18 DIAGNOSIS — M19042 Primary osteoarthritis, left hand: Secondary | ICD-10-CM

## 2020-08-18 MED ORDER — "TUBERCULIN-ALLERGY SYRINGES 27G X 1/2"" 1 ML MISC": 3 refills | Status: DC

## 2020-08-18 NOTE — Progress Notes (Signed)
Medication Samples have been provided to the patient.  Drug name: Otrexup      Strength: 25mg         Qty: 1  LOT  Exp.Date: 03/17/2021   Dosing instructions: Inject 25mg  into the skin once weekly.

## 2020-08-18 NOTE — Patient Instructions (Addendum)
Association of heart disease with rheumatoid arthritis was discussed. Need to monitor blood pressure, cholesterol, and to exercise 30-60 minutes on daily basis was discussed. Poor dental hygiene can be a predisposing factor for rheumatoid arthritis. Good dental hygiene was discussed.    Standing Labs We placed an order today for your standing lab work.   Please have your standing labs drawn in June and every 3 months   If possible, please have your labs drawn 2 weeks prior to your appointment so that the provider can discuss your results at your appointment.  Please note that you may see your imaging and lab results in MyChart before we have reviewed them. We may be awaiting multiple results to interpret others before contacting you. Please allow our office up to 72 hours to thoroughly review all of the results before contacting the office for clarification of your results.  We have open lab daily: Monday through Thursday from 1:30-4:30 PM and Friday from 1:30-4:00 PM at the office of Dr. [name], [facility].   Please be advised, all patients with office appointments requiring lab work will take precedent over walk-in lab work.  If possible, please come for your lab work on Monday and Friday afternoons, as you may experience shorter wait times. The office is located at [address], [place], [place] [address] No appointment is necessary.   Labs are drawn by [facility]. Please bring your co-pay at the time of your lab draw.  You may receive a bill from [facility] for your lab work.  If you wish to have your labs drawn at another location, please call the office 24 hours in advance to send orders.  If you have any questions regarding directions or hours of operation,  please call [phone].   As a reminder, please drink plenty of water prior to coming for your lab work. Thanks!   Hand Exercises Hand exercises can be helpful for almost anyone. These exercises  can strengthen the hands, improve flexibility and movement, and increase blood flow to the hands. These results can make work and daily tasks easier. Hand exercises can be especially helpful for people who have joint pain from arthritis or have nerve damage from overuse (carpal tunnel syndrome). These exercises can also help people who have injured a hand. Exercises Most of these hand exercises are gentle stretching and motion exercises. It is usually safe to do them often throughout the day. Warming up your hands before exercise may help to reduce stiffness. You can do this with gentle massage or by placing your hands in warm water for 10-15 minutes. It is normal to feel some stretching, pulling, tightness, or mild discomfort as you begin new exercises. This will gradually improve. Stop an exercise right away if you feel sudden, severe pain or your pain gets worse. Ask your health care provider which exercises are best for you. Knuckle bend or "claw" fist 1. Stand or sit with your arm, hand, and all five fingers pointed straight up. Make sure to keep your wrist straight during the exercise. 2. Gently bend your fingers down toward your palm until the tips of your fingers are touching the top of your palm. Keep your big knuckle straight and just bend the small knuckles in your fingers. 3. Hold this position for __________ seconds. 4. Straighten (extend) your fingers back to the starting position. Repeat this exercise 5-10 times with each hand. Full finger fist 1. Stand or sit with your arm, hand, and all  five fingers pointed straight up. Make sure to keep your wrist straight during the exercise. 2. Gently bend your fingers into your palm until the tips of your fingers are touching the middle of your palm. 3. Hold this position for __________ seconds. 4. Extend your fingers back to the starting position, stretching every joint fully. Repeat this exercise 5-10 times with each hand. Straight  fist 1. Stand or sit with your arm, hand, and all five fingers pointed straight up. Make sure to keep your wrist straight during the exercise. 2. Gently bend your fingers at the big knuckle, where your fingers meet your hand, and the middle knuckle. Keep the knuckle at the tips of your fingers straight and try to touch the bottom of your palm. 3. Hold this position for __________ seconds. 4. Extend your fingers back to the starting position, stretching every joint fully. Repeat this exercise 5-10 times with each hand. Tabletop 1. Stand or sit with your arm, hand, and all five fingers pointed straight up. Make sure to keep your wrist straight during the exercise. 2. Gently bend your fingers at the big knuckle, where your fingers meet your hand, as far down as you can while keeping the small knuckles in your fingers straight. Think of forming a tabletop with your fingers. 3. Hold this position for __________ seconds. 4. Extend your fingers back to the starting position, stretching every joint fully. Repeat this exercise 5-10 times with each hand. Finger spread 1. Place your hand flat on a table with your palm facing down. Make sure your wrist stays straight as you do this exercise. 2. Spread your fingers and thumb apart from each other as far as you can until you feel a gentle stretch. Hold this position for __________ seconds. 3. Bring your fingers and thumb tight together again. Hold this position for __________ seconds. Repeat this exercise 5-10 times with each hand. Making circles 1. Stand or sit with your arm, hand, and all five fingers pointed straight up. Make sure to keep your wrist straight during the exercise. 2. Make a circle by touching the tip of your thumb to the tip of your index finger. 3. Hold for __________ seconds. Then open your hand wide. 4. Repeat this motion with your thumb and each finger on your hand. Repeat this exercise 5-10 times with each hand. Thumb motion 1. Sit  with your forearm resting on a table and your wrist straight. Your thumb should be facing up toward the ceiling. Keep your fingers relaxed as you move your thumb. 2. Lift your thumb up as high as you can toward the ceiling. Hold for __________ seconds. 3. Bend your thumb across your palm as far as you can, reaching the tip of your thumb for the small finger (pinkie) side of your palm. Hold for __________ seconds. Repeat this exercise 5-10 times with each hand. Grip strengthening 1. Hold a stress ball or other soft ball in the middle of your hand. 2. Slowly increase the pressure, squeezing the ball as much as you can without causing pain. Think of bringing the tips of your fingers into the middle of your palm. All of your finger joints should bend when doing this exercise. 3. Hold your squeeze for __________ seconds, then relax. Repeat this exercise 5-10 times with each hand.   Contact a health care provider if:  Your hand pain or discomfort gets much worse when you do an exercise.  Your hand pain or discomfort does not improve within 2  hours after you exercise. If you have any of these problems, stop doing these exercises right away. Do not do them again unless your health care provider says that you can. Get help right away if:  You develop sudden, severe hand pain or swelling. If this happens, stop doing these exercises right away. Do not do them again unless your health care provider says that you can. This information is not intended to replace advice given to you by your health care provider. Make sure you discuss any questions you have with your health care provider. Document Revised: 06/25/2018 Document Reviewed: 03/05/2018 Elsevier Patient Education  2021 [employer].

## 2020-08-21 ENCOUNTER — Other Ambulatory Visit: Payer: Self-pay | Admitting: *Deleted

## 2020-08-21 DIAGNOSIS — Z79899 Other long term (current) drug therapy: Secondary | ICD-10-CM | POA: Diagnosis not present

## 2020-08-22 LAB — COMPLETE METABOLIC PANEL WITH GFR
AG Ratio: 1.1 (calc) (ref 1.0–2.5)
ALT: 10 U/L (ref 6–29)
AST: 14 U/L (ref 10–35)
Albumin: 4.2 g/dL (ref 3.6–5.1)
Alkaline phosphatase (APISO): 88 U/L (ref 37–153)
BUN: 19 mg/dL (ref 7–25)
CO2: 29 mmol/L (ref 20–32)
Calcium: 9.8 mg/dL (ref 8.6–10.4)
Chloride: 100 mmol/L (ref 98–110)
Creat: 0.88 mg/dL (ref 0.50–1.05)
GFR, Est African American: 85 mL/min/{1.73_m2} (ref >=60)
GFR, Est Non African American: 73 mL/min/{1.73_m2} (ref >=60)
Globulin: 3.7 g/dL (calc) (ref 1.9–3.7)
Glucose, Bld: 101 mg/dL — ABNORMAL HIGH (ref 65–99)
Potassium: 4.6 mmol/L (ref 3.5–5.3)
Sodium: 138 mmol/L (ref 135–146)
Total Bilirubin: 0.7 mg/dL (ref 0.2–1.2)
Total Protein: 7.9 g/dL (ref 6.1–8.1)

## 2020-08-22 LAB — CBC WITH DIFFERENTIAL/PLATELET
Absolute Monocytes: 585 cells/uL (ref 200–950)
Basophils Absolute: 39 cells/uL (ref 0–200)
Basophils Relative: 0.5 %
Eosinophils Absolute: 231 cells/uL (ref 15–500)
Eosinophils Relative: 3 %
HCT: 41.4 % (ref 35.0–45.0)
Hemoglobin: 12.6 g/dL (ref 11.7–15.5)
Lymphs Abs: 3511 cells/uL (ref 850–3900)
MCH: 24.1 pg — ABNORMAL LOW (ref 27.0–33.0)
MCHC: 30.4 g/dL — ABNORMAL LOW (ref 32.0–36.0)
MCV: 79.2 fL — ABNORMAL LOW (ref 80.0–100.0)
MPV: 9.6 fL (ref 7.5–12.5)
Monocytes Relative: 7.6 %
Neutro Abs: 3334 cells/uL (ref 1500–7800)
Neutrophils Relative %: 43.3 %
Platelets: 411 10*3/uL — ABNORMAL HIGH (ref 140–400)
RBC: 5.23 10*6/uL — ABNORMAL HIGH (ref 3.80–5.10)
RDW: 14.6 % (ref 11.0–15.0)
Total Lymphocyte: 45.6 %
WBC: 7.7 10*3/uL (ref 3.8–10.8)

## 2020-08-22 NOTE — Progress Notes (Signed)
CMP WNL.  Platelet count is borderline elevated.  RBC count is borderline elevated.  Rest of CBC stable.

## 2020-08-31 ENCOUNTER — Ambulatory Visit: Payer: Medicare HMO | Admitting: Family Medicine

## 2020-09-20 ENCOUNTER — Other Ambulatory Visit: Payer: Self-pay | Admitting: *Deleted

## 2020-09-20 DIAGNOSIS — M0609 Rheumatoid arthritis without rheumatoid factor, multiple sites: Secondary | ICD-10-CM

## 2020-09-20 MED ORDER — RINVOQ 15 MG PO TB24: 15.0000 mg | ORAL_TABLET | Freq: Every day | ORAL | 0 refills | Status: DC

## 2020-09-20 NOTE — Telephone Encounter (Signed)
Refill request received via fax  Next Visit: 01/19/2021  Last Visit: 08/18/2020  Last Fill: 05/26/2020  AJ:OINOMVEHMC arthritis of multiple sites with negative rheumatoid factor  Current Dose per office note 08/18/2020: - Rinvoq 15 mg 1 tablet by mouth daily,  Labs: 08/21/2020 CMP WNL.  Platelet count is borderline elevated.  RBC count is borderlineelevated.  Rest of CBC stable.   TB Gold: 10/22/2019 Neg    Okay to refill Rinvoq?

## 2020-10-02 ENCOUNTER — Encounter: Payer: Self-pay | Admitting: Family Medicine

## 2020-10-04 ENCOUNTER — Encounter: Payer: Self-pay | Admitting: Family Medicine

## 2020-10-04 ENCOUNTER — Telehealth (INDEPENDENT_AMBULATORY_CARE_PROVIDER_SITE_OTHER): Payer: Medicare HMO | Admitting: Family Medicine

## 2020-10-04 DIAGNOSIS — I10 Essential (primary) hypertension: Secondary | ICD-10-CM

## 2020-10-04 DIAGNOSIS — M0600 Rheumatoid arthritis without rheumatoid factor, unspecified site: Secondary | ICD-10-CM | POA: Diagnosis not present

## 2020-10-04 MED ORDER — PHENTERMINE HCL 37.5 MG PO TABS: ORAL_TABLET | ORAL | 3 refills | Status: DC

## 2020-10-04 NOTE — Assessment & Plan Note (Signed)
Followed by rheumatology, increased debility

## 2020-10-04 NOTE — Assessment & Plan Note (Signed)
DASH diet and commitment to daily physical activity for a minimum of 30 minutes discussed and encouraged, as a part of hypertension management. The importance of attaining a healthy weight is also discussed.  BP/Weight 08/18/2020 08/02/2020 05/29/2020 04/11/2020 02/23/2020 12/21/2019 12/09/2019  Systolic BP 134 - - 122 118 109 119  Diastolic BP 80 - - 78 76 69 73  Wt. (Lbs) 332.2 - 319 323.8 323 330.8 327  BMI 60.76 - 58.35 59.22 59.08 60.5 59.81  Some encounter information is confidential and restricted. Go to Review Flowsheets activity to see all data.

## 2020-10-04 NOTE — Assessment & Plan Note (Signed)
  Patient re-educated about  the importance of commitment to a  minimum of 150 minutes of exercise per week as able.  The importance of healthy food choices with portion control discussed, as well as eating regularly and within a 12 hour window most days. The need to choose "clean , green" food 50 to 75% of the time is discussed, as well as to make water the primary drink and set a goal of 64 ounces water daily.    Weight /BMI 08/18/2020 08/02/2020 05/29/2020  WEIGHT 332 lb 3.2 oz - 319 lb  HEIGHT 5\' 2"  - 5\' 2"   BMI 60.76 kg/m2 - 58.35 kg/m2  Some encounter information is confidential and restricted. Go to Review Flowsheets activity to see all data.    Reduce phentermine to half daily

## 2020-10-04 NOTE — Progress Notes (Signed)
Virtual Visit via Telephone Note  I connected with [name] on 10/04/20 at  8:00 AM EDT by telephone and verified that I am speaking with the correct person using two identifiers.  Location: Patient: home Provider: office   I discussed the limitations, risks, security and privacy concerns of performing an evaluation and management service by telephone and the availability of in person appointments. I also discussed with the patient that there may be a patient responsible charge related to this service. The patient expressed understanding and agreed to proceed.   History of Present Illness: F/U chronic problems and address any new or current concerns. Review and update medications and allergies. Review recent lab and radiologic data . Update routine health maintainace. Review an encourage improved health habits to include nutrition, exercise and  sleep . C/o excess fatigue, poor sleep and inreasing weakness in hands, unable to open bottles and often drops objects Will get covid booster next week   Observations/Objective: There were no vitals taken for this visit. Good communication with no confusion and intact memory. Alert and oriented x 3 No signs of respiratory distress during speech   Assessment and Plan: Essential hypertension DASH diet and commitment to daily physical activity for a minimum of 30 minutes discussed and encouraged, as a part of hypertension management. The importance of attaining a healthy weight is also discussed.  BP/Weight 08/18/2020 08/02/2020 05/29/2020 04/11/2020 02/23/2020 12/21/2019 12/09/2019  Systolic BP 134 - - 122 118 109 119  Diastolic BP 80 - - 78 76 69 73  Wt. (Lbs) 332.2 - 319 323.8 323 330.8 327  BMI 60.76 - 58.35 59.22 59.08 60.5 59.81  Some encounter information is confidential and restricted. Go to Review Flowsheets activity to see all data.       Rheumatoid arthritis with negative rheumatoid factor (HCC) Followed by  rheumatology, increased debility  Morbid obesity  Patient re-educated about  the importance of commitment to a  minimum of 150 minutes of exercise per week as able.  The importance of healthy food choices with portion control discussed, as well as eating regularly and within a 12 hour window most days. The need to choose "clean , green" food 50 to 75% of the time is discussed, as well as to make water the primary drink and set a goal of 64 ounces water daily.    Weight /BMI 08/18/2020 08/02/2020 05/29/2020  WEIGHT 332 lb 3.2 oz - 319 lb  HEIGHT 5\' 2"  - 5\' 2"   BMI 60.76 kg/m2 - 58.35 kg/m2  Some encounter information is confidential and restricted. Go to Review Flowsheets activity to see all data.    Reduce phentermine to half daily   Follow Up Instructions:    I discussed the assessment and treatment plan with the patient. The patient was provided an opportunity to ask questions and all were answered. The patient agreed with the plan and demonstrated an understanding of the instructions.   The patient was advised to call back or seek an in-person evaluation if the symptoms worsen or if the condition fails to improve as anticipated.  I provided 15 minutes of non-face-to-face time during this encounter.   , MD

## 2020-10-04 NOTE — Patient Instructions (Addendum)
Annual exam in office with MD in October, call if you need me sooner  Please focus on getting at least 6 hours sleep daily  Please intentionally make foods available that are healthy, from the garden  Lower phentermine dose half tab, take in the evening as you work at night   Please get shingrix vaccines

## 2020-10-10 DIAGNOSIS — Z23 Encounter for immunization: Secondary | ICD-10-CM | POA: Diagnosis not present

## 2020-10-13 ENCOUNTER — Ambulatory Visit (HOSPITAL_COMMUNITY): Payer: Medicare HMO

## 2020-10-16 ENCOUNTER — Ambulatory Visit (HOSPITAL_COMMUNITY)
Admission: RE | Admit: 2020-10-16 | Discharge: 2020-10-16 | Disposition: A | Discharge status: Home / Self Care | Payer: Medicare HMO | Source: Ambulatory Visit | Attending: Family Medicine | Admitting: Family Medicine

## 2020-10-16 ENCOUNTER — Other Ambulatory Visit: Payer: Self-pay

## 2020-10-16 ENCOUNTER — Other Ambulatory Visit: Payer: Self-pay | Admitting: Physician Assistant

## 2020-10-16 DIAGNOSIS — Z1231 Encounter for screening mammogram for malignant neoplasm of breast: Secondary | ICD-10-CM

## 2020-10-16 NOTE — Telephone Encounter (Signed)
Next Visit: 01/19/2021  Last Visit: 08/18/2020  Last Fill: 07/20/2020  DX: Rheumatoid arthritis of multiple sites with negative rheumatoid factor  Current Dose per office note on 08/18/2020: MTX 1.0 ml sq injections once weekly  Labs: 08/21/2020  CMP WNL.  Platelet count is borderline elevated.  RBC count is borderlineelevated.  Rest of CBC stable.   Okay to refill methotrexate?

## 2020-10-18 DIAGNOSIS — Z0100 Encounter for examination of eyes and vision without abnormal findings: Secondary | ICD-10-CM | POA: Diagnosis not present

## 2020-10-18 DIAGNOSIS — H52223 Regular astigmatism, bilateral: Secondary | ICD-10-CM | POA: Diagnosis not present

## 2020-10-18 DIAGNOSIS — I10 Essential (primary) hypertension: Secondary | ICD-10-CM | POA: Diagnosis not present

## 2020-10-18 DIAGNOSIS — E139 Other specified diabetes mellitus without complications: Secondary | ICD-10-CM | POA: Diagnosis not present

## 2020-10-24 DIAGNOSIS — T1511XA Foreign body in conjunctival sac, right eye, initial encounter: Secondary | ICD-10-CM | POA: Diagnosis not present

## 2020-11-13 ENCOUNTER — Emergency Department (HOSPITAL_COMMUNITY)
Admission: EM | Admit: 2020-11-13 | Discharge: 2020-11-14 | Disposition: A | Discharge status: Home / Self Care | Payer: Medicare HMO | Attending: Emergency Medicine | Admitting: Emergency Medicine

## 2020-11-13 ENCOUNTER — Emergency Department (HOSPITAL_COMMUNITY): Payer: Medicare HMO

## 2020-11-13 ENCOUNTER — Other Ambulatory Visit: Payer: Self-pay

## 2020-11-13 ENCOUNTER — Encounter (HOSPITAL_COMMUNITY): Payer: Self-pay | Admitting: *Deleted

## 2020-11-13 DIAGNOSIS — Z79899 Other long term (current) drug therapy: Secondary | ICD-10-CM | POA: Insufficient documentation

## 2020-11-13 DIAGNOSIS — E039 Hypothyroidism, unspecified: Secondary | ICD-10-CM | POA: Diagnosis not present

## 2020-11-13 DIAGNOSIS — R062 Wheezing: Secondary | ICD-10-CM | POA: Diagnosis not present

## 2020-11-13 DIAGNOSIS — B9789 Other viral agents as the cause of diseases classified elsewhere: Secondary | ICD-10-CM | POA: Diagnosis not present

## 2020-11-13 DIAGNOSIS — Z96653 Presence of artificial knee joint, bilateral: Secondary | ICD-10-CM | POA: Insufficient documentation

## 2020-11-13 DIAGNOSIS — Z20822 Contact with and (suspected) exposure to covid-19: Secondary | ICD-10-CM | POA: Insufficient documentation

## 2020-11-13 DIAGNOSIS — I5032 Chronic diastolic (congestive) heart failure: Secondary | ICD-10-CM | POA: Insufficient documentation

## 2020-11-13 DIAGNOSIS — I110 Hypertensive heart disease with heart failure: Secondary | ICD-10-CM | POA: Diagnosis not present

## 2020-11-13 DIAGNOSIS — J069 Acute upper respiratory infection, unspecified: Secondary | ICD-10-CM | POA: Diagnosis not present

## 2020-11-13 DIAGNOSIS — Z91040 Latex allergy status: Secondary | ICD-10-CM | POA: Diagnosis not present

## 2020-11-13 DIAGNOSIS — M79601 Pain in right arm: Secondary | ICD-10-CM | POA: Diagnosis not present

## 2020-11-13 DIAGNOSIS — R0602 Shortness of breath: Secondary | ICD-10-CM | POA: Diagnosis not present

## 2020-11-13 DIAGNOSIS — R110 Nausea: Secondary | ICD-10-CM | POA: Diagnosis not present

## 2020-11-13 LAB — BASIC METABOLIC PANEL
Anion gap: 7 (ref 5–15)
BUN: 13 mg/dL (ref 6–20)
CO2: 26 mmol/L (ref 22–32)
Calcium: 8.6 mg/dL — ABNORMAL LOW (ref 8.9–10.3)
Chloride: 102 mmol/L (ref 98–111)
Creatinine, Ser: 0.75 mg/dL (ref 0.44–1.00)
GFR, Estimated: >60 mL/min (ref >60)
Glucose, Bld: 100 mg/dL — ABNORMAL HIGH (ref 70–99)
Potassium: 3.5 mmol/L (ref 3.5–5.1)
Sodium: 135 mmol/L (ref 135–145)

## 2020-11-13 LAB — CBC
HCT: 36.3 % (ref 36.0–46.0)
Hemoglobin: 11.2 g/dL — ABNORMAL LOW (ref 12.0–15.0)
MCH: 25.4 pg — ABNORMAL LOW (ref 26.0–34.0)
MCHC: 30.9 g/dL (ref 30.0–36.0)
MCV: 82.3 fL (ref 80.0–100.0)
Platelets: 357 10*3/uL (ref 150–400)
RBC: 4.41 MIL/uL (ref 3.87–5.11)
RDW: 15.9 % — ABNORMAL HIGH (ref 11.5–15.5)
WBC: 10.4 10*3/uL (ref 4.0–10.5)
nRBC: 0 % (ref 0.0–0.2)

## 2020-11-13 LAB — BRAIN NATRIURETIC PEPTIDE: B Natriuretic Peptide: 33 pg/mL (ref 0.0–100.0)

## 2020-11-13 MED ORDER — IPRATROPIUM BROMIDE 0.02 % IN SOLN
0.5000 mg | Freq: Once | RESPIRATORY_TRACT | Status: AC
  Administered 2020-11-13: 0.5 mg via RESPIRATORY_TRACT
  Filled 2020-11-13: qty 2.5

## 2020-11-13 MED ORDER — ALBUTEROL SULFATE (2.5 MG/3ML) 0.083% IN NEBU
5.0000 mg | INHALATION_SOLUTION | Freq: Once | RESPIRATORY_TRACT | Status: AC
  Administered 2020-11-13: 5 mg via RESPIRATORY_TRACT
  Filled 2020-11-13: qty 6

## 2020-11-13 NOTE — ED Triage Notes (Signed)
Pt with SOB since this morning.  Right arm pain at times.  ? CHF.  C/o nausea en route and IV zofran 4 mg given en route to 18 g to [facility]

## 2020-11-13 NOTE — ED Provider Notes (Signed)
[facility] EMERGENCY DEPARTMENT Provider Note   CSN: [id number] Arrival date & time: 11/13/20  2123     History Chief Complaint  Patient presents with   Shortness of Breath    [name] is a 60 y.o. female.  The history is provided by the patient.  Shortness of Breath Severity:  Moderate Onset quality:  Gradual Duration:  6 hours Timing:  Intermittent Progression:  Worsening Chronicity:  New Relieved by:  Nothing Worsened by:  Nothing Associated symptoms: cough, fever and sore throat   Associated symptoms: no chest pain   Patient reports last night she had a brief episode of shortness of breath that resolved.  Then approximately 6 hours ago she began feeling short of breath.  She reports chills and cough.  She has had fevers at home.  She reports shortness of breath continues.  No chest pain.  She is also had right forearm pain without any trauma.  She also reports 25 pound weight gain in the past 2 weeks. Patient has history of rheumatoid arthritis and is immunocompromised Patient reports fully vaccinated for COVID-19    Past Medical History:  Diagnosis Date   Chronic back pain    Degenerative joint disease    Left TKA in 07/2009-Dr. Landau   Depression 2001   doesn't take any meds   Headache(784.0)    occasionally   Human bite of forearm 11/30/2019   Hypothyroidism    was on Levothyroxine-has been off x 4 months   Joint pain    Joint swelling    Morbid obesity (HCC)    Nocturia    Osteoarthritis of right knee 07/13/2013   Peripheral edema    to right leg;takes Furosemide occasionally and hasnt had one in a month   Syncope    with palpitations; evaluated by [facility] in 2008 with normal echo    Patient Active Problem List   Diagnosis Date Noted   Rheumatoid arthritis with negative rheumatoid factor (HCC) 06/02/2020   Tendinitis of right ankle 10/12/2019   High risk medication use 01/07/2019   Generalized joint pain  10/20/2018   Recurrent major depressive disorder, in full remission (HCC) 04/10/2018   Allergic dermatitis 02/02/2018   Major depressive disorder, recurrent episode, moderate (HCC) 08/29/2017   Back pain with sciatica 11/13/2015   Knee osteoarthritis 07/13/2013   Chronic diastolic heart failure (HCC) 01/06/2013   Snoring disorder 01/03/2013   Thoracic spine pain 12/29/2012   Vitamin D deficiency 12/08/2012   Exercise intolerance 11/26/2011   Essential hypertension 11/26/2011   Migraine headache    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 Lovell Sheehan   COLONOSCOPY N/A 03/03/2013   Procedure: COLONOSCOPY;  Surgeon: Malissa Hippo, MD;  Location: [facility]ITE;  Service: Endoscopy;  Laterality: N/A;  930   CRANIECTOMY SUBOCCIPITAL W/ CERVICAL LAMINECTOMY / CHIARI  2007   Repair of Arnold-Chiari malformation   EYE SURGERY     bilateral cataract surgery   JOINT REPLACEMENT Left 2011   KNEE ARTHROSCOPY  1995   Dr. Cleophas Dunker    THYROIDECTOMY, PARTIAL  2004   Left lobectomy - large benign nodule  TONSILLECTOMY     TOTAL KNEE ARTHROPLASTY  07/2009   Left knee , Dr. Dion Saucier    TOTAL KNEE ARTHROPLASTY Right 07/13/2013   Procedure: TOTAL KNEE ARTHROPLASTY;  Surgeon: Eulas Post, MD;  Location: [facility] OR;  Service: Orthopedics;  Laterality: Right;     OB History   No obstetric history on file.     Family History  Problem Relation Age of Onset   Diabetes Mother    Depression Mother    Diabetes Father        emphsema    Hyperlipidemia Father    Emphysema Father    Rheum arthritis Sister    Breast cancer Sister    Cancer Sister    Healthy Daughter    Healthy Daughter    Healthy Daughter     Colon cancer Neg Hx     Social History   Tobacco Use   Smoking status: Never   Smokeless tobacco: Never  Vaping Use   Vaping Use: Never used  Substance Use Topics   Alcohol use: Not Currently    Alcohol/week: 0.0 standard drinks   Drug use: No    Home Medications Prior to Admission medications   Medication Sig Start Date End Date Taking? Authorizing Provider  acyclovir (ZOVIRAX) 400 MG tablet TAKE 1 TABLET BY MOUTH THREE TIMES A DAY Patient taking differently: as needed. 06/05/20   Kerri Perches, MD  folic acid (FOLVITE) 1 MG tablet TAKE 2 TABLETS BY MOUTH EVERY DAY 01/04/20   [name], MD  gabapentin (NEURONTIN) 300 MG capsule Take 1 capsule (300 mg total) by mouth at bedtime. Patient taking differently: Take 300 mg by mouth as needed. 01/04/20   Kerri Perches, MD  hydrochlorothiazide (HYDRODIURIL) 25 MG tablet TAKE 1 TABLET BY MOUTH EVERY DAY 02/16/20   Kerri Perches, MD  hydrOXYzine (ATARAX/VISTARIL) 50 MG tablet TAKE ONE TABLET AT BEDTIME FOR SLEEP 06/22/20   Kerri Perches, MD  Methotrexate Sodium (METHOTREXATE, PF,) 50 MG/2ML injection INJECT 1 ML (25 MG TOTAL) INTO THE SKIN ONCE A WEEK. WITH PRESERVATIVE 10/16/20   Gearldine Bienenstock, PA-C  phentermine (ADIPEX-P) 37.5 MG tablet Take half tablet by mouth once daily. 10/04/20   Kerri Perches, MD  Tuberculin-Allergy Syringes 27G X 1/2" 1 ML MISC Use 1 syringe once weekly to inject methotrexate. 08/18/20   Gearldine Bienenstock, PA-C  Upadacitinib ER (RINVOQ) 15 MG TB24 Take 15 mg by mouth daily. 09/20/20   Gearldine Bienenstock, PA-C    Allergies    Aleve [naproxen sodium], Penicillins, Adhesive [tape], and Latex  Review of Systems   Review of Systems  Constitutional:  Positive for chills and fever.  HENT:  Positive for sore throat.   Respiratory:  Positive for cough and shortness of breath.   Cardiovascular:  Negative for chest pain.  All other systems reviewed and are negative.  Physical Exam Updated Vital  Signs BP 121/67 (BP Location: Left Arm)   Pulse 74   Temp 98.9 F (37.2 C) (Oral)   Resp 19   Ht 1.575 m (5\' 2" )   Wt (!) 158.8 kg   SpO2 100%   BMI 64.02 kg/m   Physical Exam CONSTITUTIONAL: Well developed, chronically ill-appearing HEAD: Normocephalic/atraumatic EYES: EOMI/PERRL ENMT: Mucous membranes moist, no drooling, no stridor, uvula midline without erythema or exudate NECK: supple no meningeal signs, no anterior neck edema SPINE/BACK:entire spine nontender CV: S1/S2 noted, no murmurs/rubs/gallops noted LUNGS: Mild tachypnea noted, scattered wheezing noted ABDOMEN: soft, nontender,  no rebound or guarding, bowel sounds noted throughout abdomen GU:no cva tenderness NEURO: Pt is awake/alert/appropriate, moves all extremitiesx4.  No facial droop.   EXTREMITIES: pulses normal/equal, full ROM, symmetric edema noted to bilateral lower extremities No edema noted to right upper extremity.  Distal pulses intact.  Mild tenderness to right forearm but no erythema, no crepitus or deformities SKIN: warm, color normal PSYCH: no abnormalities of mood noted, alert and oriented to situation  ED Results / Procedures / Treatments   Labs (all labs ordered are listed, but only abnormal results are displayed) Labs Reviewed  BASIC METABOLIC PANEL - Abnormal; Notable for the following components:      Result Value   Glucose, Bld 100 (*)    Calcium 8.6 (*)    All other components within normal limits  CBC - Abnormal; Notable for the following components:   Hemoglobin 11.2 (*)    MCH 25.4 (*)    RDW 15.9 (*)    All other components within normal limits  RESP PANEL BY RT-PCR (FLU A&B, COVID) ARPGX2  BRAIN NATRIURETIC PEPTIDE    EKG EKG Interpretation  Date/Time:  Monday November 13 2020 21:40:25 EDT Ventricular Rate:  72 PR Interval:  188 QRS Duration: 80 QT Interval:  378 QTC Calculation: 413 R Axis:   62 Text Interpretation: Normal sinus rhythm Nonspecific ST abnormality Confirmed  by Cathren Laine ([id number]) on 11/13/2020 9:51:10 PM  Radiology DG Chest 2 View  Result Date: 11/13/2020 CLINICAL DATA:  Shortness of breath. EXAM: CHEST - 2 VIEW COMPARISON:  Chest radiograph dated 11/25/2014 FINDINGS: Shallow inspiration. No focal consolidation, pleural effusion, pneumothorax. The silhouette is within limits. No acute osseous pathology. Degenerative changes of the spine. Lower cervical ACDF. IMPRESSION: No active cardiopulmonary disease. Electronically Signed   By: Elgie Collard M.D.   On: 11/13/2020 22:07    Procedures Procedures   Medications Ordered in ED Medications  albuterol (PROVENTIL) (2.5 MG/3ML) 0.083% nebulizer solution 5 mg (5 mg Nebulization Given 11/13/20 2330)  ipratropium (ATROVENT) nebulizer solution 0.5 mg (0.5 mg Nebulization Given 11/13/20 2330)  albuterol (VENTOLIN HFA) 108 (90 Base) MCG/ACT inhaler 2 puff (2 puffs Inhalation Given 11/14/20 0136)    ED Course  I have reviewed the triage vital signs and the nursing notes.  Pertinent labs & imaging results that were available during my care of the patient were reviewed by me and considered in my medical decision making (see chart for details).    MDM Rules/Calculators/A&P                           Patient presents with fever, chills, shortness of breath and cough.  I have reviewed the prehospital EKG and no acute changes.  Prehospital notes indicate patient did have fever 100.6 but was not hypoxic at home   Patient felt improved in the emergency department.  Negative for flu and COVID-19.  I personally reviewed the chest x-ray and has no acute findings.  No convincing signs of CHF. Given the recent fever, cough likely viral illness.  No signs of pneumonia.  Low suspicion for PE at this time.  Patient ambulated in no distress and no hypoxia Patient is requesting discharge home.  Will give albuterol at time of discharge.  We discussed strict return precautions. Discussed this with daughter who is  agreeable with plan Final Clinical Impression(s) / ED Diagnoses Final diagnoses:  Viral URI with cough    Rx / DC Orders ED Discharge  Orders     None        Zadie Rhine, MD 11/14/20 [phone]

## 2020-11-13 NOTE — ED Notes (Signed)
Fever earlier today

## 2020-11-14 LAB — RESP PANEL BY RT-PCR (FLU A&B, COVID) ARPGX2
Influenza A by PCR: NEGATIVE
Influenza B by PCR: NEGATIVE
SARS Coronavirus 2 by RT PCR: NEGATIVE

## 2020-11-14 MED ORDER — ALBUTEROL SULFATE HFA 108 (90 BASE) MCG/ACT IN AERS
2.0000 | INHALATION_SPRAY | Freq: Once | RESPIRATORY_TRACT | Status: AC
  Administered 2020-11-14: 2 via RESPIRATORY_TRACT
  Filled 2020-11-14: qty 6.7

## 2020-11-14 NOTE — ED Notes (Signed)
Pt ambulated with no difficulty or distress noted. Pt remained at 99% on RA.

## 2020-12-14 ENCOUNTER — Ambulatory Visit (INDEPENDENT_AMBULATORY_CARE_PROVIDER_SITE_OTHER): Payer: Medicare HMO | Admitting: Family Medicine

## 2020-12-14 ENCOUNTER — Other Ambulatory Visit: Payer: Self-pay

## 2020-12-14 ENCOUNTER — Encounter: Payer: Self-pay | Admitting: Family Medicine

## 2020-12-14 DIAGNOSIS — J40 Bronchitis, not specified as acute or chronic: Secondary | ICD-10-CM

## 2020-12-14 MED ORDER — PREDNISONE 5 MG PO TABS: 5.0000 mg | ORAL_TABLET | Freq: Two times a day (BID) | ORAL | 0 refills | Status: AC

## 2020-12-14 MED ORDER — BENZONATATE 100 MG PO CAPS: 100.0000 mg | ORAL_CAPSULE | Freq: Two times a day (BID) | ORAL | 0 refills | Status: DC | PRN

## 2020-12-14 MED ORDER — AZITHROMYCIN 250 MG PO TABS
ORAL_TABLET | ORAL | 0 refills | Status: AC
Start: 2020-12-14 — End: 2020-12-19

## 2020-12-14 NOTE — Progress Notes (Signed)
Virtual Visit via Telephone Note  I connected with Lasean Y Chanda Busing on 12/14/20 at  8:00 AM EDT by telephone and verified that I am speaking with the correct person using two identifiers.  Location: Patient: home Provider: office   I discussed the limitations, risks, security and privacy concerns of performing an evaluation and management service by telephone and the availability of in person appointments. I also discussed with the patient that there may be a patient responsible charge related to this service. The patient expressed understanding and agreed to proceed.   History of Present Illness: 2 week h/o cough, 3 day h/o green sputum, no fever , chills or body aches, denies upper espiatory symptoms   Observations/Objective: There were no vitals taken for this visit. Good communication with no confusion and intact memory. Alert and oriented x 3 No signs of respiratory distress during speech, intermittent cough   Assessment and Plan: Bronchitis z pack, prednisobne and tessalon perles prescribed, use robitussin dM as needed , for excess cough, stop cough dros, call if worsen or got ED   Follow Up Instructions:    I discussed the assessment and treatment plan with the patient. The patient was provided an opportunity to ask questions and all were answered. The patient agreed with the plan and demonstrated an understanding of the instructions.   The patient was advised to call back or seek an in-person evaluation if the symptoms worsen or if the condition fails to improve as anticipated.  I provided 12 minutes of non-face-to-face time during this encounter.   [name], MD

## 2020-12-14 NOTE — Assessment & Plan Note (Signed)
z pack, prednisobne and tessalon perles prescribed, use robitussin dM as needed , for excess cough, stop cough dros, call if worsen or got ED

## 2020-12-14 NOTE — Patient Instructions (Signed)
F/U as before, call if you need me sooner  You are treated for acute bronchitis, Z pack, prednisone and tessalon perles are prescribed  Thanks for choosing [facility], we consider it a privelige to serve you.

## 2021-01-02 ENCOUNTER — Ambulatory Visit (INDEPENDENT_AMBULATORY_CARE_PROVIDER_SITE_OTHER): Payer: Medicare HMO | Admitting: Family Medicine

## 2021-01-02 ENCOUNTER — Other Ambulatory Visit: Payer: Self-pay

## 2021-01-02 ENCOUNTER — Encounter: Payer: Self-pay | Admitting: Family Medicine

## 2021-01-02 VITALS — BP 128/84 | HR 62 | Resp 17 | Ht 62.0 in | Wt 351.1 lb

## 2021-01-02 DIAGNOSIS — R29898 Other symptoms and signs involving the musculoskeletal system: Secondary | ICD-10-CM

## 2021-01-02 DIAGNOSIS — Z0001 Encounter for general adult medical examination with abnormal findings: Secondary | ICD-10-CM

## 2021-01-02 DIAGNOSIS — R1013 Epigastric pain: Secondary | ICD-10-CM

## 2021-01-02 DIAGNOSIS — R7301 Impaired fasting glucose: Secondary | ICD-10-CM | POA: Diagnosis not present

## 2021-01-02 DIAGNOSIS — Z23 Encounter for immunization: Secondary | ICD-10-CM | POA: Diagnosis not present

## 2021-01-02 DIAGNOSIS — R490 Dysphonia: Secondary | ICD-10-CM | POA: Insufficient documentation

## 2021-01-02 HISTORY — DX: Encounter for general adult medical examination with abnormal findings: Z00.01

## 2021-01-02 MED ORDER — METFORMIN HCL 500 MG PO TABS: 500.0000 mg | ORAL_TABLET | Freq: Two times a day (BID) | ORAL | 1 refills | Status: DC

## 2021-01-02 MED ORDER — FAMOTIDINE 40 MG PO TABS
40.0000 mg | ORAL_TABLET | Freq: Every day | ORAL | 1 refills | Status: DC
Start: 2021-01-02 — End: 2021-08-28

## 2021-01-02 NOTE — Progress Notes (Signed)
    [name]     MRN: [id number]      DOB: 10-Nov-1960  HPI: Patient is in for annual physical exam. 1 year h/o progressive weakness and numbness of RLE with 3 falls in past 4 months, drags the leg, no incontinence of stool or urine Chronic hoarseness, feels  as tho something is in her throat x 6 month Recent labs,  are reviewed. Immunization is reviewed , and  updated .   PE: BP 128/84   Pulse 62   Resp 17   Ht 5\' 2"  (1.575 m)   Wt (!) 351 lb 1.9 oz (159.3 kg)   SpO2 96%   BMI 64.22 kg/m   Pleasant  female, alert and oriented x 3, in no cardio-pulmonary distress.pt in pain , looks ill Afebrile. HEENT No facial trauma or asymetry. Sinuses non tender.  Extra occullar muscles intact.. External ears normal, .TM clear bilaterally Neck decreased ROM, no adenopathy,JVD or thyromegaly.No bruits.  Chest: Clear to ascultation bilaterally.No crackles or wheezes. Non tender to palpation   Cardiovascular system; Heart sounds normal,  S1 and  S2 ,no S3.  No murmur, or thrill. Apical beat not displaced Peripheral pulses normal.  Abdomen: Soft, non tender, no organomegaly or masses.  Bowel sounds normal. No guarding, tenderness or rebound.    Musculoskeletal exam: Decreased ROM of spine, hips , shoulders and knees. No deformity ,swelling or crepitus noted. No muscle wasting or atrophy.   Neurologic: Cranial nerves 2 to 12 intact. Grade 4 power in RLE otherwise normal  disturbance in gait. No tremor.  Skin: Intact, no ulceration, erythema , scaling or rash noted. Pigmentation normal throughout  Psych; Depressed  mood and affect. Judgement and concentration normal   Assessment & Plan:  Annual visit for general adult medical examination with abnormal findings Annual exam as documented. Counseling done  re healthy lifestyle involving commitment to 150 minutes exercise per week, heart healthy diet, and attaining healthy weight.The importance of adequate sleep  also discussed. Regular seat belt use and home safety, is also discussed. Changes in health habits are decided on by the patient with goals and time frames  set for achieving them. Immunization and cancer screening needs are specifically addressed at this visit.   Morbid obesity  Patient re-educated about  the importance of commitment to a  minimum of 150 minutes of exercise per week as able.  The importance of healthy food choices with portion control discussed, as well as eating regularly and within a 12 hour window most days. The need to choose "clean , green" food 50 to 75% of the time is discussed, as well as to make water the primary drink and set a goal of 64 ounces water daily.    Weight /BMI 01/02/2021 11/13/2020 08/18/2020  WEIGHT 351 lb 1.9 oz 350 lb 332 lb 3.2 oz  HEIGHT 5\' 2"  5\' 2"  5\' 2"   BMI 64.22 kg/m2 64.02 kg/m2 60.76 kg/m2  Some encounter information is confidential and restricted. Go to Review Flowsheets activity to see all data.      Hoarseness of voice 6 month history, worsening, refer to ENT, painless  Right leg weakness Progressive RLE wekness in past 6 to 9 months, with recurrent falls, Neurology to evaluate  Dyspepsia Check H pylori and add pepcid

## 2021-01-02 NOTE — Assessment & Plan Note (Signed)

## 2021-01-02 NOTE — Assessment & Plan Note (Addendum)
6 month history, worsening, refer to ENT, painless

## 2021-01-02 NOTE — Patient Instructions (Addendum)
Follow-up in January call me if you need me sooner.  Flu vaccine in office today.  HPylori and HbA1c today   New is metformin 500 mg 1 twice daily.  New is Pepcid 40 mg once daily.  You are referred to ENT for chronic painless hoarseness.  You are referred to neurology for progressive weakness and numbness of the right leg with recurrent falls.  Prescription for cane is being sent to your pharmacy.  I recommend you return to weight loss clinic for weight management  Thanks for choosing [facility], we consider it a privelige to serve you.

## 2021-01-02 NOTE — Assessment & Plan Note (Signed)
  Patient re-educated about  the importance of commitment to a  minimum of 150 minutes of exercise per week as able.  The importance of healthy food choices with portion control discussed, as well as eating regularly and within a 12 hour window most days. The need to choose "clean , green" food 50 to 75% of the time is discussed, as well as to make water the primary drink and set a goal of 64 ounces water daily.    Weight /BMI 01/02/2021 11/13/2020 08/18/2020  WEIGHT 351 lb 1.9 oz 350 lb 332 lb 3.2 oz  HEIGHT 5\' 2"  5\' 2"  5\' 2"   BMI 64.22 kg/m2 64.02 kg/m2 60.76 kg/m2  Some encounter information is confidential and restricted. Go to Review Flowsheets activity to see all data.

## 2021-01-03 LAB — HEMOGLOBIN A1C
Est. average glucose Bld gHb Est-mCnc: 131 mg/dL
Hgb A1c MFr Bld: 6.2 % — ABNORMAL HIGH (ref 4.8–5.6)

## 2021-01-04 ENCOUNTER — Other Ambulatory Visit: Payer: Self-pay | Admitting: *Deleted

## 2021-01-04 ENCOUNTER — Telehealth: Payer: Self-pay | Admitting: Family Medicine

## 2021-01-04 DIAGNOSIS — M0609 Rheumatoid arthritis without rheumatoid factor, multiple sites: Secondary | ICD-10-CM

## 2021-01-04 DIAGNOSIS — Z79899 Other long term (current) drug therapy: Secondary | ICD-10-CM

## 2021-01-04 LAB — H. PYLORI BREATH TEST: H pylori Breath Test: NEGATIVE

## 2021-01-04 MED ORDER — RINVOQ 15 MG PO TB24: 15.0000 mg | ORAL_TABLET | Freq: Every day | ORAL | 0 refills | Status: DC

## 2021-01-04 NOTE — Addendum Note (Signed)
Addended by: Henriette Combs on: 01/04/2021 01:36 PM   Modules accepted: Orders

## 2021-01-04 NOTE — Telephone Encounter (Signed)
Left message to advise patient she is due to update labs. Lipid Panel added as future order.

## 2021-01-04 NOTE — Telephone Encounter (Signed)
Please advise the patient to return for updated lab work ASAP. Please stress the importance of close lab monitoring while on Rinvoq.   She is overdue for TB gold.  Please also check status of last lipid panel.

## 2021-01-04 NOTE — Telephone Encounter (Signed)
Refill request received via fax  Next Visit: 01/19/2021  Last Visit: 08/18/2020  Last Fill: 09/20/2020  [id number] arthritis of multiple sites with negative rheumatoid factor   Current Dose per office note 08/18/2020: Rinvoq 15 mg 1 tablet by mouth daily  Labs: 08/21/2020 CMP WNL.  Platelet count is borderline elevated.  RBC count is borderline elevated.  Rest of CBC stable.   TB Gold: 10/22/2019 Neg    Left message to advise patient she is due to update labs.   Okay to refill Rinvoq?

## 2021-01-04 NOTE — Telephone Encounter (Signed)
Llison from Dr. Suszanne Conners office called on behalf of patient . They do not accept pt insurance and we will need to refer her to another office.

## 2021-01-07 ENCOUNTER — Encounter: Payer: Self-pay | Admitting: Family Medicine

## 2021-01-07 DIAGNOSIS — R1013 Epigastric pain: Secondary | ICD-10-CM | POA: Insufficient documentation

## 2021-01-07 DIAGNOSIS — R29898 Other symptoms and signs involving the musculoskeletal system: Secondary | ICD-10-CM | POA: Insufficient documentation

## 2021-01-07 NOTE — Assessment & Plan Note (Signed)
Progressive RLE wekness in past 6 to 9 months, with recurrent falls, Neurology to evaluate

## 2021-01-07 NOTE — Assessment & Plan Note (Signed)
Check H pylori and add pepcid

## 2021-01-08 NOTE — Progress Notes (Signed)
Office Visit Note  Patient: [name]             Date of Birth: 28-Jul-1960           MRN: [id number]             PCP: Kerri Perches, MD Referring: Kerri Perches, MD Visit Date: 01/19/2021 Occupation: @GUAROCC @  Subjective:  Pain in both hands and right knee.   History of Present Illness: [name] is a 60 y.o. female with a history of rheumatoid arthritis and osteoarthritis.  She states she is doing quite well on the combination of Rinvoq and methotrexate.  She continues to have some discomfort in her hands which she describes over the  bilateral CMC joints.  She also had a fall in June 2022.  She states she injured her right knee joint which is a still painful.  She has not seen the orthopedic surgeon.  She has an appointment coming up with a neurologist due to discomfort.  She denies any history of joint swelling.  She did not do any discomfort in her cervical spine today.  Activities of Daily Living:  Patient reports morning stiffness for 30-40 minutes.   Patient Denies nocturnal pain.  Difficulty dressing/grooming: Denies Difficulty climbing stairs: Reports Difficulty getting out of chair: Denies Difficulty using hands for taps, buttons, cutlery, and/or writing: Reports  Review of Systems  Constitutional:  Positive for fatigue.  HENT:  Negative for mouth sores, mouth dryness and nose dryness.   Eyes:  Negative for pain, itching and dryness.  Respiratory:  Negative for shortness of breath and difficulty breathing.   Cardiovascular:  Negative for chest pain and palpitations.  Gastrointestinal:  Negative for blood in stool, constipation and diarrhea.  Endocrine: Positive for increased urination.  Genitourinary:  Negative for difficulty urinating.  Musculoskeletal:  Positive for joint pain, joint pain, joint swelling, myalgias, morning stiffness and myalgias. Negative for muscle tenderness.  Skin:  Negative for color change, rash and redness.   Allergic/Immunologic: Positive for susceptible to infections.  Neurological:  Negative for dizziness, numbness, headaches, memory loss and weakness.  Hematological:  Positive for bruising/bleeding tendency.  Psychiatric/Behavioral:  Negative for confusion.    PMFS History:  Patient Active Problem List   Diagnosis Date Noted   Right leg weakness 01/07/2021   Dyspepsia 01/07/2021   Annual visit for general adult medical examination with abnormal findings 01/02/2021   Hoarseness of voice 01/02/2021   Rheumatoid arthritis with negative rheumatoid factor (HCC) 06/02/2020   Tendinitis of right ankle 10/12/2019   High risk medication use 01/07/2019   Generalized joint pain 10/20/2018   Recurrent major depressive disorder, in full remission (HCC) 04/10/2018   Allergic dermatitis 02/02/2018   Major depressive disorder, recurrent episode, moderate (HCC) 08/29/2017   Back pain with sciatica 11/13/2015   Knee osteoarthritis 07/13/2013   Chronic diastolic heart failure (HCC) 01/06/2013   Snoring disorder 01/03/2013   Thoracic spine pain 12/29/2012   Vitamin D deficiency 12/08/2012   Exercise intolerance 11/26/2011   Essential hypertension 11/26/2011   Migraine headache    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 Landau   Depression 2001   doesn't take any meds   Headache(784.0)    occasionally   Human bite of forearm 11/30/2019   Hypothyroidism  was on Levothyroxine-has been off x 4 months   Joint pain    Joint swelling    Morbid obesity (HCC)    Nocturia    Osteoarthritis of right knee 07/13/2013   Peripheral edema    to right leg;takes Furosemide occasionally and hasnt had one in a month   Syncope    with palpitations; evaluated by [facility] in 2008 with normal echo    Family History   Problem Relation Age of Onset   Diabetes Mother    Depression Mother    Diabetes Father        emphsema    Hyperlipidemia Father    Emphysema Father    Rheum arthritis Sister    Breast cancer Sister    Cancer Sister    Healthy Daughter    Healthy Daughter    Healthy Daughter    Colon cancer Neg Hx    Past Surgical History:  Procedure Laterality Date   ABDOMINAL HYSTERECTOMY  1995    no oophorectomy   CARPAL TUNNEL RELEASE  1991   Bilateral   CESAREAN SECTION     X2   CHOLECYSTECTOMY     CHOLECYSTECTOMY, LAPAROSCOPIC  2007   Dr. Lovell Sheehan   COLONOSCOPY N/A 03/03/2013   Procedure: COLONOSCOPY;  Surgeon: Malissa Hippo, MD;  Location: [facility];  Service: Endoscopy;  Laterality: N/A;  930   CRANIECTOMY SUBOCCIPITAL W/ CERVICAL LAMINECTOMY / CHIARI  2007   Repair of Arnold-Chiari malformation   EYE SURGERY     bilateral cataract surgery   JOINT REPLACEMENT Left 2011   KNEE ARTHROSCOPY  1995   Dr. Cleophas Dunker    THYROIDECTOMY, PARTIAL  2004   Left lobectomy - large benign nodule   TONSILLECTOMY     TOTAL KNEE ARTHROPLASTY  07/2009   Left knee , Dr. Dion Saucier    TOTAL KNEE ARTHROPLASTY Right 07/13/2013   Procedure: TOTAL KNEE ARTHROPLASTY;  Surgeon: Eulas Post, MD;  Location: [facility] OR;  Service: Orthopedics;  Laterality: Right;   Social History   Social History Narrative   Is in a new relationship    Immunization History  Administered Date(s) Administered   Influenza Split 01/16/2012   Influenza Whole 02/23/2007, 12/07/2008   Influenza,inj,Quad PF,6+ Mos 12/07/2012, 02/15/2014, 11/28/2014, 01/09/2016, 04/07/2017, 12/18/2017, 02/16/2019, 12/09/2019, 01/02/2021   Moderna Sars-Covid-2 Vaccination 06/09/2019, 07/07/2019, 03/06/2020, 10/10/2020   Pneumococcal Polysaccharide-23 07/31/2015   Td 10/23/2005   Tdap 11/30/2019     Objective: Vital Signs: BP 126/74 (BP Location: Left Wrist, Patient Position: Sitting, Cuff Size: Normal)   Pulse (!) 56   Ht 5\' 2"  (1.575 m)    Wt (!) 354 lb 9.6 oz (160.8 kg)   BMI 64.86 kg/m    Physical Exam Vitals and nursing note reviewed.  Constitutional:      Appearance: She is well-developed.  HENT:     Head: Normocephalic and atraumatic.  Eyes:     Conjunctiva/sclera: Conjunctivae normal.  Cardiovascular:     Rate and Rhythm: Normal rate and regular rhythm.     Heart sounds: Normal heart sounds.  Pulmonary:     Effort: Pulmonary effort is normal.     Breath sounds: Normal breath sounds.  Abdominal:     General: Bowel sounds are normal.     Palpations: Abdomen is soft.  Musculoskeletal:     Cervical back: Normal range of motion.  Lymphadenopathy:     Cervical: No cervical adenopathy.  Skin:    General: Skin is warm and  dry.     Capillary Refill: Capillary refill takes less than 2 seconds.  Neurological:     Mental Status: She is alert and oriented to person, place, and time.  Psychiatric:        Behavior: Behavior normal.     Musculoskeletal Exam: C-spine was in good range of motion.  Shoulder joints, elbow joints, wrist joints with good range of motion.  She had tenderness over bilateral CMC joints PIP and DIP joints with thickening.  There was no synovitis of her wrist joints or MCP joints.  Hip joints and knee joints with good range of motion.  There was no tenderness over ankles or MTPs.  CDAI Exam: CDAI Score: 0.6  Patient Global: 3 mm; Provider Global: 3 mm Swollen: 0 ; Tender: 0  Joint Exam 01/19/2021   No joint exam has been documented for this visit   There is currently no information documented on the homunculus. Go to the Rheumatology activity and complete the homunculus joint exam.  Investigation: No additional findings.  Imaging: No results found.  Recent Labs: Lab Results  Component Value Date   WBC 10.4 11/13/2020   HGB 11.2 (L) 11/13/2020   PLT 357 11/13/2020   NA 135 11/13/2020   K 3.5 11/13/2020   CL 102 11/13/2020   CO2 26 11/13/2020   GLUCOSE 100 (H) 11/13/2020   BUN  13 11/13/2020   CREATININE 0.75 11/13/2020   BILITOT 0.7 08/21/2020   ALKPHOS 88 02/04/2020   AST 14 08/21/2020   ALT 10 08/21/2020   PROT 7.9 08/21/2020   ALBUMIN 4.2 02/04/2020   CALCIUM 8.6 (L) 11/13/2020   GFRAA 85 08/21/2020   QFTBGOLDPLUS NEGATIVE 10/22/2019    Speciality Comments: No specialty comments available.  Procedures:  No procedures performed Allergies: Aleve [naproxen sodium], Penicillins, Adhesive [tape], and Latex   Assessment / Plan:     Visit Diagnoses: Rheumatoid arthritis of multiple sites with negative rheumatoid factor (HCC) - RF negative, anti-CCP negative, severe synovitis and radiographic changes with MCP narrowing and juxta-articular osteopenia: She is clinically doing much better on Rinvoq and methotrexate combination.  She had no synovitis on my examination today.  She continues to have some discomfort in her hands which I believe is due to underlying osteoarthritis.  High risk medication use - Rinvoq 15 mg 1 tablet by mouth daily, MTX 1.0 ml sq injections once weekly, and folic acid 2 mg po daily.   -Labs obtained on November 13, 2020 were reviewed.  She had mild anemia.  GFR, AST and ALT were within normal limits.  Her last TB gold was October 22, 2019.  She has not had a lipid panel in 1 year.  We will obtain TB Gold and fasting lipid panel today.  Information regarding immunization was placed in the AVS.  She is also advised to stop Rinvoq and methotrexate in case she develops an infection.  MACE warning associated with Rinvoq was reviewed.  I advised her to add aspirin 81 mg p.o.daily.  She did sedentary lifestyle.  Increase water intake and staying active was also emphasized.  I will obtain CBC with differential and CMP with GFR today and then every 3 months to monitor for drug toxicity.  Plan: CBC with Differential/Platelet, COMPLETE METABOLIC PANEL WITH GFR, Lipid panel, QuantiFERON-TB Gold Plus  Primary osteoarthritis of both hands-she has bilateral PIP DIP  and CMC thickening.  Joint protection muscle strengthening was discussed.  A handout on hand exercises was given.  Status post total  bilateral knee replacement-she had a fall in June and since then she has been having discomfort in her right knee joint.  I advised her to schedule an appointment with her orthopedic surgeon.  Primary osteoarthritis of both feet-proper fitting shoes were discussed.  DDD (degenerative disc disease), cervical-she had good range of motion in her cervical spine without much discomfort.  Vitamin D deficiency-she has history of vitamin D deficiency.  Her vitamin D was in March 2022.  Advised to take vitamin D supplement which she discontinued.  Family history of rheumatoid arthritis  Essential hypertension-blood pressure was normal today.  Increased risk of heart disease with rheumatoid arthritis was discussed.  Need for regular exercise and dietary modifications were discussed and a handout was placed in the AVS.  Chronic diastolic heart failure (HCC)  Prediabetes  History of gastroesophageal reflux (GERD)  Hx of migraines  History of depression  S/P partial thyroidectomy  Orders: Orders Placed This Encounter  Procedures   CBC with Differential/Platelet   COMPLETE METABOLIC PANEL WITH GFR   Lipid panel   QuantiFERON-TB Gold Plus    No orders of the defined types were placed in this encounter.    Follow-Up Instructions: Return in about 3 months (around 04/21/2021) for Rheumatoid arthritis, Osteoarthritis.   [name], MD  Note - This record has been created using Animal nutritionist.  Chart creation errors have been sought, but may not always  have been located. Such creation errors do not reflect on  the standard of medical care.

## 2021-01-11 ENCOUNTER — Telehealth: Payer: Self-pay

## 2021-01-11 NOTE — Telephone Encounter (Signed)
error 

## 2021-01-18 ENCOUNTER — Other Ambulatory Visit: Payer: Self-pay | Admitting: Physician Assistant

## 2021-01-18 NOTE — Telephone Encounter (Signed)
Next Visit: 01/19/2021   Last Visit: 08/18/2020   Last Fill: 10/16/2020   JX:BJYNWGNFAO arthritis of multiple sites with negative rheumatoid factor    Current Dose per office note 08/18/2020:MTX 1.0 ml sq injections once weekly   Labs: Glucose 100, Calcium 8.6, RBC 5.23, MCV 79.2, MCH 24.1, MCHC 30.4, Platelets 411  Okay to refill MTX?

## 2021-01-19 ENCOUNTER — Encounter: Payer: Self-pay | Admitting: Rheumatology

## 2021-01-19 ENCOUNTER — Ambulatory Visit: Payer: Medicare HMO | Admitting: Rheumatology

## 2021-01-19 ENCOUNTER — Other Ambulatory Visit: Payer: Self-pay

## 2021-01-19 VITALS — BP 126/74 | HR 56 | Ht 62.0 in | Wt 354.6 lb

## 2021-01-19 DIAGNOSIS — Z8719 Personal history of other diseases of the digestive system: Secondary | ICD-10-CM

## 2021-01-19 DIAGNOSIS — I5032 Chronic diastolic (congestive) heart failure: Secondary | ICD-10-CM

## 2021-01-19 DIAGNOSIS — M19042 Primary osteoarthritis, left hand: Secondary | ICD-10-CM

## 2021-01-19 DIAGNOSIS — I10 Essential (primary) hypertension: Secondary | ICD-10-CM

## 2021-01-19 DIAGNOSIS — R7303 Prediabetes: Secondary | ICD-10-CM

## 2021-01-19 DIAGNOSIS — Z96653 Presence of artificial knee joint, bilateral: Secondary | ICD-10-CM

## 2021-01-19 DIAGNOSIS — M19071 Primary osteoarthritis, right ankle and foot: Secondary | ICD-10-CM

## 2021-01-19 DIAGNOSIS — Z8261 Family history of arthritis: Secondary | ICD-10-CM

## 2021-01-19 DIAGNOSIS — Z8659 Personal history of other mental and behavioral disorders: Secondary | ICD-10-CM

## 2021-01-19 DIAGNOSIS — Z79899 Other long term (current) drug therapy: Secondary | ICD-10-CM

## 2021-01-19 DIAGNOSIS — M5030 Other cervical disc degeneration, unspecified cervical region: Secondary | ICD-10-CM

## 2021-01-19 DIAGNOSIS — M19041 Primary osteoarthritis, right hand: Secondary | ICD-10-CM

## 2021-01-19 DIAGNOSIS — M19072 Primary osteoarthritis, left ankle and foot: Secondary | ICD-10-CM

## 2021-01-19 DIAGNOSIS — M0609 Rheumatoid arthritis without rheumatoid factor, multiple sites: Secondary | ICD-10-CM

## 2021-01-19 DIAGNOSIS — E890 Postprocedural hypothyroidism: Secondary | ICD-10-CM

## 2021-01-19 DIAGNOSIS — Z8669 Personal history of other diseases of the nervous system and sense organs: Secondary | ICD-10-CM

## 2021-01-19 DIAGNOSIS — E559 Vitamin D deficiency, unspecified: Secondary | ICD-10-CM | POA: Diagnosis not present

## 2021-01-19 NOTE — Patient Instructions (Addendum)
Standing Labs We placed an order today for your standing lab work.   Please have your standing labs drawn in February and every 3 months  If possible, please have your labs drawn 2 weeks prior to your appointment so that the provider can discuss your results at your appointment.  Please note that you may see your imaging and lab results in MyChart before we have reviewed them. We may be awaiting multiple results to interpret others before contacting you. Please allow our office up to 72 hours to thoroughly review all of the results before contacting the office for clarification of your results.  We have open lab daily: Monday through Thursday from 1:30-4:30 PM and Friday from 1:30-4:00 PM at the office of Dr. [name], [facility].   Please be advised, all patients with office appointments requiring lab work will take precedent over walk-in lab work.  If possible, please come for your lab work on Monday and Friday afternoons, as you may experience shorter wait times. The office is located at [address], [place], [place] [address] No appointment is necessary.   Labs are drawn by [facility]. Please bring your co-pay at the time of your lab draw.  You may receive a bill from [facility] for your lab work.  If you wish to have your labs drawn at another location, please call the office 24 hours in advance to send orders.  If you have any questions regarding directions or hours of operation,  please call [phone].   As a reminder, please drink plenty of water prior to coming for your lab work. Thanks!   Vaccines You are taking a medication(s) that can suppress your immune system.  The following immunizations are recommended: Flu annually Covid-19  Td/Tdap (tetanus, diphtheria, pertussis) every 10 years Pneumonia (Prevnar 15 then Pneumovax 23 at least 1 year apart.  Alternatively, can take Prevnar 20 without needing additional dose) Shingrix: 2 doses from 4  weeks to 6 months apart  Please check with your PCP to make sure you are up to date.   COVID-19 vaccine recommendations:   COVID-19 vaccine is recommended for everyone (unless you are allergic to a vaccine component), even if you are on a medication that suppresses your immune system.   If you are on Methotrexate, Cellcept (mycophenolate), Rinvoq, Harriette Ohara, and Olumiant- hold the medication for 1 week after each vaccine. Hold Methotrexate for 2 weeks after the single dose COVID-19 vaccine.   Do not take Tylenol or any anti-inflammatory medications (NSAIDs) 24 hours prior to the COVID-19 vaccination.   There is no direct evidence about the efficacy of the COVID-19 vaccine in individuals who are on medications that suppress the immune system.   Even if you are fully vaccinated, and you are on any medications that suppress your immune system, please continue to wear a mask, maintain at least six feet social distance and practice hand hygiene.   If you develop a COVID-19 infection, please contact your PCP or our office to determine if you need monoclonal antibody infusion.  The booster vaccine is now available for immunocompromised patients.   Please see the following web sites for updated information.   [email or web address]/[email or web address]-[email or web address]-Patient-Resources.[email or web address]  If you have signs or symptoms of an infection or start antibiotics: First, call your PCP for workup of your infection. Hold your medication through the infection, until you complete your antibiotics, and until symptoms resolve if you take the following: Injectable medication (Actemra, Benlysta, Cimzia, Cosentyx, Enbrel, Humira, Kevzara, Orencia, Remicade,  Simponi, Stelara, Lesle Chris) Methotrexate Leflunomide (Arava) Mycophenolate (Cellcept) Osborne [place], or Rinvoq   Heart Disease Prevention   Your inflammatory disease increases your risk of heart disease which includes heart attack,  stroke, atrial fibrillation (irregular heartbeats), high blood pressure, heart failure and atherosclerosis (plaque in the arteries).  It is important to reduce your risk by:   Keep blood pressure, cholesterol, and blood sugar at healthy levels   Smoking Cessation   Maintain a healthy weight  BMI 20-25   Eat a healthy diet  Plenty of fresh fruit, vegetables, and whole grains  Limit saturated fats, foods high in sodium, and added sugars  DASH and Mediterranean diet   Increase physical activity  Recommend moderate physically activity for 150 minutes per week/ 30 minutes a day for five days a week These can be broken up into three separate ten-minute sessions during the day.   Reduce Stress  Meditation, slow breathing exercises, yoga, coloring books  Dental visits twice a year    Hand Exercises Hand exercises can be helpful for almost anyone. These exercises can strengthen the hands, improve flexibility and movement, and increase blood flow to the hands. These results can make work and daily tasks easier. Hand exercises can be especially helpful for people who have joint pain from arthritis or have nerve damage from overuse (carpal tunnel syndrome). These exercises can also help people who have injured a hand. Exercises Most of these hand exercises are gentle stretching and motion exercises. It is usually safe to do them often throughout the day. Warming up your hands before exercise may help to reduce stiffness. You can do this with gentle massage or by placing your hands in warm water for 10-15 minutes. It is normal to feel some stretching, pulling, tightness, or mild discomfort as you begin new exercises. This will gradually improve. Stop an exercise right away if you feel sudden, severe pain or your pain gets worse. Ask your health care provider which exercises are best for you. Knuckle bend or "claw" fist  Stand or sit with your arm, hand, and all five fingers pointed straight up. Make  sure to keep your wrist straight during the exercise. Gently bend your fingers down toward your palm until the tips of your fingers are touching the top of your palm. Keep your big knuckle straight and just bend the small knuckles in your fingers. Hold this position for __________ seconds. Straighten (extend) your fingers back to the starting position. Repeat this exercise 5-10 times with each hand. Full finger fist  Stand or sit with your arm, hand, and all five fingers pointed straight up. Make sure to keep your wrist straight during the exercise. Gently bend your fingers into your palm until the tips of your fingers are touching the middle of your palm. Hold this position for __________ seconds. Extend your fingers back to the starting position, stretching every joint fully. Repeat this exercise 5-10 times with each hand. Straight fist Stand or sit with your arm, hand, and all five fingers pointed straight up. Make sure to keep your wrist straight during the exercise. Gently bend your fingers at the big knuckle, where your fingers meet your hand, and the middle knuckle. Keep the knuckle at the tips of your fingers straight and try to touch the bottom of your palm. Hold this position for __________ seconds. Extend your fingers back to the starting position, stretching every joint fully. Repeat this exercise 5-10 times with each hand. Tabletop  Stand or sit  with your arm, hand, and all five fingers pointed straight up. Make sure to keep your wrist straight during the exercise. Gently bend your fingers at the big knuckle, where your fingers meet your hand, as far down as you can while keeping the small knuckles in your fingers straight. Think of forming a tabletop with your fingers. Hold this position for __________ seconds. Extend your fingers back to the starting position, stretching every joint fully. Repeat this exercise 5-10 times with each hand. Finger spread  Place your hand flat on  a table with your palm facing down. Make sure your wrist stays straight as you do this exercise. Spread your fingers and thumb apart from each other as far as you can until you feel a gentle stretch. Hold this position for __________ seconds. Bring your fingers and thumb tight together again. Hold this position for __________ seconds. Repeat this exercise 5-10 times with each hand. Making circles  Stand or sit with your arm, hand, and all five fingers pointed straight up. Make sure to keep your wrist straight during the exercise. Make a circle by touching the tip of your thumb to the tip of your index finger. Hold for __________ seconds. Then open your hand wide. Repeat this motion with your thumb and each finger on your hand. Repeat this exercise 5-10 times with each hand. Thumb motion  Sit with your forearm resting on a table and your wrist straight. Your thumb should be facing up toward the ceiling. Keep your fingers relaxed as you move your thumb. Lift your thumb up as high as you can toward the ceiling. Hold for __________ seconds. Bend your thumb across your palm as far as you can, reaching the tip of your thumb for the small finger (pinkie) side of your palm. Hold for __________ seconds. Repeat this exercise 5-10 times with each hand. Grip strengthening  Hold a stress ball or other soft ball in the middle of your hand. Slowly increase the pressure, squeezing the ball as much as you can without causing pain. Think of bringing the tips of your fingers into the middle of your palm. All of your finger joints should bend when doing this exercise. Hold your squeeze for __________ seconds, then relax. Repeat this exercise 5-10 times with each hand. Contact a health care provider if: Your hand pain or discomfort gets much worse when you do an exercise. Your hand pain or discomfort does not improve within 2 hours after you exercise. If you have any of these problems, stop doing these  exercises right away. Do not do them again unless your health care provider says that you can. Get help right away if: You develop sudden, severe hand pain or swelling. If this happens, stop doing these exercises right away. Do not do them again unless your health care provider says that you can. This information is not intended to replace advice given to you by your health care provider. Make sure you discuss any questions you have with your health care provider. Document Revised: 06/22/2020 Document Reviewed: 06/22/2020 Elsevier Patient Education  2022 [employer].

## 2021-01-20 NOTE — Progress Notes (Signed)
Lipid panel is normal.  MCV is low, most likely due to iron deficiency.  Platelets are mildly increased.  CMP is a stable.

## 2021-01-21 LAB — CBC WITH DIFFERENTIAL/PLATELET
Absolute Monocytes: 591 cells/uL (ref 200–950)
Basophils Absolute: 37 cells/uL (ref 0–200)
Basophils Relative: 0.5 %
Eosinophils Absolute: 168 cells/uL (ref 15–500)
Eosinophils Relative: 2.3 %
HCT: 39.1 % (ref 35.0–45.0)
Hemoglobin: 12.2 g/dL (ref 11.7–15.5)
Lymphs Abs: 2373 cells/uL (ref 850–3900)
MCH: 24.9 pg — ABNORMAL LOW (ref 27.0–33.0)
MCHC: 31.2 g/dL — ABNORMAL LOW (ref 32.0–36.0)
MCV: 79.8 fL — ABNORMAL LOW (ref 80.0–100.0)
MPV: 9.8 fL (ref 7.5–12.5)
Monocytes Relative: 8.1 %
Neutro Abs: 4132 cells/uL (ref 1500–7800)
Neutrophils Relative %: 56.6 %
Platelets: 420 10*3/uL — ABNORMAL HIGH (ref 140–400)
RBC: 4.9 10*6/uL (ref 3.80–5.10)
RDW: 14.3 % (ref 11.0–15.0)
Total Lymphocyte: 32.5 %
WBC: 7.3 10*3/uL (ref 3.8–10.8)

## 2021-01-21 LAB — COMPLETE METABOLIC PANEL WITH GFR
AG Ratio: 1 (calc) (ref 1.0–2.5)
ALT: 10 U/L (ref 6–29)
AST: 13 U/L (ref 10–35)
Albumin: 4 g/dL (ref 3.6–5.1)
Alkaline phosphatase (APISO): 88 U/L (ref 37–153)
BUN: 17 mg/dL (ref 7–25)
CO2: 31 mmol/L (ref 20–32)
Calcium: 9.4 mg/dL (ref 8.6–10.4)
Chloride: 100 mmol/L (ref 98–110)
Creat: 0.74 mg/dL (ref 0.50–1.03)
Globulin: 3.9 g/dL (calc) — ABNORMAL HIGH (ref 1.9–3.7)
Glucose, Bld: 93 mg/dL (ref 65–99)
Potassium: 4.3 mmol/L (ref 3.5–5.3)
Sodium: 138 mmol/L (ref 135–146)
Total Bilirubin: 0.5 mg/dL (ref 0.2–1.2)
Total Protein: 7.9 g/dL (ref 6.1–8.1)
eGFR: 94 mL/min/{1.73_m2} (ref >=60)

## 2021-01-21 LAB — LIPID PANEL
Cholesterol: 139 mg/dL (ref <200)
HDL: 51 mg/dL (ref >=50)
LDL Cholesterol (Calc): 70 mg/dL (calc)
Non-HDL Cholesterol (Calc): 88 mg/dL (calc) (ref <130)
Total CHOL/HDL Ratio: 2.7 (calc) (ref <5.0)
Triglycerides: 101 mg/dL (ref <150)

## 2021-01-21 LAB — QUANTIFERON-TB GOLD PLUS
Mitogen-NIL: >10 IU/mL
NIL: 0.04 IU/mL
QuantiFERON-TB Gold Plus: NEGATIVE
TB1-NIL: 0.01 IU/mL
TB2-NIL: 0.02 IU/mL

## 2021-01-21 NOTE — Progress Notes (Signed)
TB Gold is negative.

## 2021-02-20 ENCOUNTER — Other Ambulatory Visit: Payer: Self-pay | Admitting: Rheumatology

## 2021-02-20 ENCOUNTER — Other Ambulatory Visit: Payer: Self-pay | Admitting: Family Medicine

## 2021-02-20 DIAGNOSIS — M0609 Rheumatoid arthritis without rheumatoid factor, multiple sites: Secondary | ICD-10-CM

## 2021-02-20 DIAGNOSIS — I10 Essential (primary) hypertension: Secondary | ICD-10-CM

## 2021-02-20 NOTE — Telephone Encounter (Signed)
Next Visit: 04/20/2021  Last Visit: 01/19/2021  Last Fill: 01/04/2020  Dx: Rheumatoid arthritis of multiple sites with negative rheumatoid factor   Current Dose per office note on 01/19/2021:  folic acid 2 mg po daily  Okay to refill Folic Acid?

## 2021-03-08 ENCOUNTER — Telehealth: Payer: Self-pay

## 2021-03-08 NOTE — Telephone Encounter (Signed)
Prior Authorization# [id number]

## 2021-03-08 NOTE — Telephone Encounter (Signed)
Received notification from [facility] regarding a prior authorization for [facility]. Authorization has been APPROVED from 03/18/21 to 03/17/22.   Patient can continue to fill through [facility] for Humira/Rinvoq/Skyrizi: [phone]  Phone # [phone]  Chesley Mires, PharmD, MPH, BCPS Clinical Pharmacist (Rheumatology and Pulmonology)

## 2021-03-08 NOTE — Telephone Encounter (Signed)
Per [employer], pt's current PA is expiring.  Submitted a Prior Authorization request to [facility] for RINVOQ via CoverMyMeds. Will update once we receive a response.   Key: [id number]

## 2021-03-29 DIAGNOSIS — I10 Essential (primary) hypertension: Secondary | ICD-10-CM | POA: Diagnosis not present

## 2021-03-29 DIAGNOSIS — E038 Other specified hypothyroidism: Secondary | ICD-10-CM | POA: Diagnosis not present

## 2021-03-29 DIAGNOSIS — E559 Vitamin D deficiency, unspecified: Secondary | ICD-10-CM | POA: Diagnosis not present

## 2021-03-29 DIAGNOSIS — D509 Iron deficiency anemia, unspecified: Secondary | ICD-10-CM | POA: Diagnosis not present

## 2021-03-29 DIAGNOSIS — N951 Menopausal and female climacteric states: Secondary | ICD-10-CM | POA: Diagnosis not present

## 2021-03-29 DIAGNOSIS — R635 Abnormal weight gain: Secondary | ICD-10-CM | POA: Diagnosis not present

## 2021-03-29 DIAGNOSIS — R7303 Prediabetes: Secondary | ICD-10-CM | POA: Diagnosis not present

## 2021-03-29 DIAGNOSIS — Z6841 Body Mass Index (BMI) 40.0 and over, adult: Secondary | ICD-10-CM | POA: Diagnosis not present

## 2021-04-05 ENCOUNTER — Other Ambulatory Visit: Payer: Self-pay | Admitting: Family Medicine

## 2021-04-06 ENCOUNTER — Ambulatory Visit (INDEPENDENT_AMBULATORY_CARE_PROVIDER_SITE_OTHER): Payer: Medicare HMO | Admitting: Family Medicine

## 2021-04-06 ENCOUNTER — Other Ambulatory Visit: Payer: Self-pay

## 2021-04-06 ENCOUNTER — Encounter: Payer: Self-pay | Admitting: Family Medicine

## 2021-04-06 VITALS — BP 130/84 | HR 59 | Resp 17 | Ht 61.0 in | Wt 356.1 lb

## 2021-04-06 DIAGNOSIS — I10 Essential (primary) hypertension: Secondary | ICD-10-CM | POA: Diagnosis not present

## 2021-04-06 DIAGNOSIS — J302 Other seasonal allergic rhinitis: Secondary | ICD-10-CM | POA: Diagnosis not present

## 2021-04-06 MED ORDER — MONTELUKAST SODIUM 10 MG PO TABS: 10.0000 mg | ORAL_TABLET | Freq: Every day | ORAL | 3 refills | Status: DC

## 2021-04-06 MED ORDER — PHENTERMINE HCL 37.5 MG PO TABS: ORAL_TABLET | ORAL | 0 refills | Status: DC

## 2021-04-06 NOTE — Patient Instructions (Addendum)
F/U in 6 to 7 weeks, call if you need me sooner, annual with pap and weight re evaluation at that visit  Start HALF phentermine every morning at breakfast  Singulair is prescribed for allergies  Please get shingrix #2  BEST with weight loss  Thanks for choosing [facility], we consider it a privelige to serve you.

## 2021-04-06 NOTE — Progress Notes (Signed)
Office Visit Note  Patient: [name]             Date of Birth: Jun 24, 1960           MRN: [id number]             PCP: Fayrene Helper, MD Referring: Fayrene Helper, MD Visit Date: 04/20/2021 Occupation: @GUAROCC @  Subjective:  Medication monitoring   History of Present Illness: [name] is a 61 y.o. female with history of seronegative rheumatoid arthritis and osteoarthritis.  Patient is on Rinvoq 15 mg 1 tablet by mouth daily, methotrexate 1 mL sq injections once weekly, and folic acid 2 mg daily.  She is tolerating these medications without any side effects.  She has not missed any doses recently.  She denies any signs or symptoms of a rheumatoid arthritis flare recently.  She continues to experience aching in bilateral third MCP joints but denies any joint swelling at this time.  Her morning stiffness has been lasting for about 2 hours daily.  She states both knee replacements are doing well.  She denies any increased discomfort in her neck or lower back. She denies any new medical conditions since her last office visit.  She denies any recent infections.  She states that she has had the Shingrix vaccination series since her last visit.    Activities of Daily Living:  Patient reports morning stiffness for 2 hours.   Patient Denies nocturnal pain.  Difficulty dressing/grooming: Denies Difficulty climbing stairs: Reports Difficulty getting out of chair: Denies Difficulty using hands for taps, buttons, cutlery, and/or writing: Reports  Review of Systems  Constitutional:  Positive for fatigue.  HENT:  Negative for mouth sores, mouth dryness and nose dryness.   Eyes:  Negative for pain, visual disturbance and dryness.  Respiratory:  Negative for cough, hemoptysis, shortness of breath and difficulty breathing.   Cardiovascular:  Negative for chest pain, palpitations, hypertension and swelling in legs/feet.  Gastrointestinal:  Negative for blood in stool,  constipation and diarrhea.  Endocrine: Negative for increased urination.  Genitourinary:  Negative for painful urination.  Musculoskeletal:  Positive for joint pain, joint pain and morning stiffness. Negative for joint swelling, myalgias, muscle weakness, muscle tenderness and myalgias.  Skin:  Negative for color change, pallor, rash, hair loss, nodules/bumps, skin tightness, ulcers and sensitivity to sunlight.  Allergic/Immunologic: Negative for susceptible to infections.  Neurological:  Negative for dizziness, numbness, headaches and weakness.  Hematological:  Negative for swollen glands.  Psychiatric/Behavioral:  Negative for depressed mood and sleep disturbance. The patient is not nervous/anxious.    PMFS History:  Patient Active Problem List   Diagnosis Date Noted   Seasonal allergies 04/08/2021   Right leg weakness 01/07/2021   Dyspepsia 01/07/2021   Rheumatoid arthritis with negative rheumatoid factor ([place]) 06/02/2020   Tendinitis of right ankle 10/12/2019   High risk medication use 01/07/2019   Generalized joint pain 10/20/2018   Recurrent major depressive disorder, in full remission ([place]) 04/10/2018   Allergic dermatitis 02/02/2018   Major depressive disorder, recurrent episode, moderate ([place]) 08/29/2017   Back pain with sciatica 11/13/2015   Knee osteoarthritis 07/13/2013   Chronic diastolic heart failure ([place]) 01/06/2013   Snoring disorder 01/03/2013   Thoracic spine pain 12/29/2012   Vitamin D deficiency 12/08/2012   Exercise intolerance 11/26/2011   Essential hypertension 11/26/2011   Migraine headache    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   Anemia 07/03/2011   Morbid obesity ([place]) 12/03/2007    Past  Medical History:  Diagnosis Date   Chronic back pain    Degenerative joint disease    Left TKA in 07/2009-Dr. Landau   Depression 2001   doesn't take any meds   Headache(784.0)    occasionally   Human bite of forearm 11/30/2019   Hypothyroidism    was on  Levothyroxine-has been off x 4 months   Joint pain    Joint swelling    Morbid obesity ([place])    Nocturia    Osteoarthritis of right knee 07/13/2013   Peripheral edema    to right leg;takes Furosemide occasionally and hasnt had one in a month   Syncope    with palpitations; evaluated by [facility] in 2008 with normal echo    Family History  Problem Relation Age of Onset   Diabetes Mother    Depression Mother    Diabetes Father        emphsema    Hyperlipidemia Father    Emphysema Father    Rheum arthritis Sister    Breast cancer Sister    Cancer Sister    Healthy Daughter    Healthy Daughter    Healthy Daughter    Colon cancer Neg Hx    Past Surgical History:  Procedure Laterality Date   ABDOMINAL HYSTERECTOMY  1995    no oophorectomy   CARPAL TUNNEL RELEASE  1991   Bilateral   CESAREAN SECTION     X2   CHOLECYSTECTOMY     CHOLECYSTECTOMY, LAPAROSCOPIC  2007   Dr. Arnoldo Morale   COLONOSCOPY N/A 03/03/2013   Procedure: COLONOSCOPY;  Surgeon: Rogene Houston, MD;  [facility]: [facility];  Service: Endoscopy;  Laterality: N/A;  930   CRANIECTOMY [place] / CHIARI  2007   Repair of Arnold-Chiari malformation   EYE SURGERY     bilateral cataract surgery   JOINT REPLACEMENT Left 2011   KNEE ARTHROSCOPY  1995   Dr. Durward Fortes    THYROIDECTOMY, PARTIAL  2004   Left lobectomy - large benign nodule   TONSILLECTOMY     TOTAL KNEE ARTHROPLASTY  07/2009   Left knee , Dr. Mardelle Matte    TOTAL KNEE ARTHROPLASTY Right 07/13/2013   Procedure: TOTAL KNEE ARTHROPLASTY;  Surgeon: Johnny Bridge, MD;  Location: [facility];  Service: Orthopedics;  Laterality: Right;   Social History   Social History Narrative   Is in a new relationship    Immunization History  Administered Date(s) Administered   Influenza Split 01/16/2012   Influenza Whole 02/23/2007, 12/07/2008   Influenza,inj,Quad PF,6+ Mos 12/07/2012, 02/15/2014, 11/28/2014, 01/09/2016,  04/07/2017, 12/18/2017, 02/16/2019, 12/09/2019, 01/02/2021   Moderna Sars-Covid-2 Vaccination 06/09/2019, 07/07/2019, 03/06/2020, 10/10/2020   Pneumococcal Polysaccharide-23 07/31/2015   Td 10/23/2005   Tdap 11/30/2019   Zoster Recombinat (Shingrix) 02/02/2021     Objective: Vital Signs: BP 134/85    Pulse 61    Ht 5\' 1"  (1.549 m)    Wt (!) 351 lb 6.4 oz (159.4 kg)    BMI 66.40 kg/m    Physical Exam Vitals and nursing note reviewed.  Constitutional:      Appearance: She is well-developed.  HENT:     Head: Normocephalic and atraumatic.  Eyes:     Conjunctiva/sclera: Conjunctivae normal.  Pulmonary:     Effort: Pulmonary effort is normal.  Abdominal:     Palpations: Abdomen is soft.  Musculoskeletal:     Cervical back: Normal range of motion.  Skin:    General: Skin is  warm and dry.     Capillary Refill: Capillary refill takes less than 2 seconds.  Neurological:     Mental Status: She is alert and oriented to person, place, and time.  Psychiatric:        Behavior: Behavior normal.     Musculoskeletal Exam: C-spine has good ROM with no discomfort. No midline spinal tenderness or SI joint tenderness.  Shoulder joints, elbow joints, wrist joints, MCPs, PIPs, and DIPs have good ROM with no synovitis. Tenderness and thickening of both 3rd MCP joints.  Complete fist formation bilaterally.  Hip joints have good range of motion with no groin pain.  Bilateral knee replacements have good range of motion with no warmth or effusion.  Ankle joints have good range of motion with no tenderness or joint swelling.  CDAI Exam: CDAI Score: 2.6  Patient Global: 3 mm; Provider Global: 3 mm Swollen: 0 ; Tender: 2  Joint Exam 04/20/2021      Right  Left  MCP 3   Tender   Tender     Investigation: No additional findings.  Imaging: No results found.  Recent Labs: Lab Results  Component Value Date   WBC 7.3 01/19/2021   HGB 12.2 01/19/2021   PLT 420 (H) 01/19/2021   NA 138 01/19/2021    K 4.3 01/19/2021   CL 100 01/19/2021   CO2 31 01/19/2021   GLUCOSE 93 01/19/2021   BUN 17 01/19/2021   CREATININE 0.74 01/19/2021   BILITOT 0.5 01/19/2021   ALKPHOS 88 02/04/2020   AST 13 01/19/2021   ALT 10 01/19/2021   PROT 7.9 01/19/2021   ALBUMIN 4.2 02/04/2020   CALCIUM 9.4 01/19/2021   GFRAA 85 08/21/2020   QFTBGOLDPLUS NEGATIVE 01/19/2021    Speciality Comments: No specialty comments available.  Procedures:  No procedures performed Allergies: Aleve [naproxen sodium], Penicillins, Adhesive [tape], and Latex   Assessment / Plan:     Visit Diagnoses: Rheumatoid arthritis of multiple sites with negative rheumatoid factor (HCC) - RF negative, anti-CCP negative, severe synovitis and radiographic changes with MCP narrowing and juxta-articular osteopenia: She has tenderness palpation over bilateral third MCP joints but no synovitis was noted.  She was able to make a complete fist bilaterally.  She is not experiencing any other joint pain or inflammation at this time.  Overall she has clinically been doing well taking Rinvoq 15 mg 1 tablet by mouth daily, methotrexate 1 mL subcu injections once weekly, and folic acid 2 mg daily.  She is tolerating these medications without any side effects and has not missed any doses recently.  She will remain on the current treatment regimen.  No medication changes will be made at this time.  She was advised to notify us if she develops signs or symptoms of a flare.  She will follow-up in the office in 5 months.  High risk medication use - Rinvoq 15 mg 1 tablet by mouth daily, MTX 1.0 ml sq injections once weekly, and folic acid 2 mg po daily.   CBC and CMP updated on 01/19/2021.  She is due to update CBC and CMP today.  Orders were released.  Her next lab work will be due in May and every 3 months to monitor for drug toxicity.  Lipid panel was within normal limits on 01/19/2021.  TB Gold negative on 01/19/2021 and will continue to be monitored yearly.-  Plan: CBC with Differential/Platelet, COMPLETE METABOLIC PANEL WITH GFR She has not had any recent infections.  Discussed the importance  of holding Rinvoq and methotrexate if she develops signs or symptoms of an infection and to resume once the infection is completely cleared. Discussed the black box warning associated with taking a Jak inhibitor including increased risk for major cardiovascular events and malignancy.  Blood pressure was well controlled: 134/85 today in the office. Lipid panel was within normal limits on 01/19/2021. Association of heart disease with rheumatoid arthritis was discussed. Need to monitor blood pressure, cholesterol, and to exercise 30-60 minutes on daily basis was discussed.   Primary osteoarthritis of both hands: She experiences occasional aching and stiffness in both hands.  She has tenderness over bilateral third MCP joints as described above.  She was able to make a complete fist bilaterally.  Discussed the importance of joint protection and muscle strengthening.  Status post total bilateral knee replacement: Doing well.  She has good range of motion of both knee replacements with no discomfort at this time.  No warmth or effusion was noted.  Primary osteoarthritis of both feet: She is not experiencing any discomfort in her feet currently.  She has good range of motion of both ankle joints with no tenderness or joint swelling.  Some pedal edema noted bilaterally.  DDD (degenerative disc disease), cervical: She has good range of motion of the C-spine with no discomfort at this time.  No symptoms of radiculopathy.  Vitamin D deficiency  Other medical conditions are listed as follows:  Family history of rheumatoid arthritis  Prediabetes  Essential hypertension: Blood pressure was 134/85 today in the office.  Chronic diastolic heart failure (HCC)  History of depression  S/P partial thyroidectomy  History of gastroesophageal reflux (GERD)  Hx of  migraines  Orders: Orders Placed This Encounter  Procedures   CBC with Differential/Platelet   COMPLETE METABOLIC PANEL WITH GFR   No orders of the defined types were placed in this encounter.   Follow-Up Instructions: Return in about 5 months (around 09/17/2021) for Rheumatoid arthritis, Osteoarthritis.   Ofilia Neas, PA-C  Note - This record has been created using Dragon software.  Chart creation errors have been sought, but may not always  have been located. Such creation errors do not reflect on  the standard of medical care.

## 2021-04-08 ENCOUNTER — Encounter: Payer: Self-pay | Admitting: Family Medicine

## 2021-04-08 DIAGNOSIS — J302 Other seasonal allergic rhinitis: Secondary | ICD-10-CM | POA: Insufficient documentation

## 2021-04-08 NOTE — Progress Notes (Signed)
[name]     MRN: [id number]      DOB: [birth year]   HPI [name] is here for follow up and re-evaluation of chronic medical conditions, medication management and review of any available recent lab and radiology data.  Preventive health is updated, specifically  Cancer screening and Immunization.    Has returned to weight loss clinic and states they have asked that I prescribe phentermine as will be more cost effective for her. She has used this in the past with some success She is somewhat frustrated with repeated weight regain, but wil not give up The PT denies any adverse reactions to current medications since the last visit.     ROS Denies recent fever or chills. C/o  sinus pressure, nasal congestion, dry cough and tickle in her throat, denies ear pain or sore throat. Denies chest congestion, productive cough or wheezing. Denies chest pains, palpitations and leg swelling Denies abdominal pain, nausea, vomiting,diarrhea or constipation.   Denies dysuria, frequency, hesitancy or incontinence. Chronic  joint pain, swelling and limitation in mobility. Denies headaches, seizures, numbness, or tingling. Chronic depression, anxiety or insomnia. Denies skin break down or rash.   PE  BP 130/84    Pulse (!) 59    Resp 17    Ht 5\' 1"  (1.549 m)    Wt (!) 356 lb 1.9 oz (161.5 kg)    SpO2 96%    BMI 67.29 kg/m   Patient alert and oriented and in no cardiopulmonary distress.  HEENT: No facial asymmetry, EOMI,     Neck supple .  Chest: Clear to auscultation bilaterally.  CVS: S1, S2 no murmurs, no S3.Regular rate.  ABD: Soft non tender.   Ext: No edema  MS: decreased ROM spine, shoulders, hips and knees.  Skin: Intact, no ulcerations or rash noted.  Psych: Good eye contact, normal affect. Memory intact not anxious mildly  depressed appearing.  CNS: CN 2-12 intact, power,  normal throughout.no focal deficits noted.   Assessment & Plan  Essential  hypertension Controlled, no change in medication DASH diet and commitment to daily physical activity for a minimum of 30 minutes discussed and encouraged, as a part of hypertension management. The importance of attaining a healthy weight is also discussed.  BP/Weight 04/06/2021 01/19/2021 01/02/2021 11/14/2020 11/13/2020 08/18/2020 08/02/2020  Systolic BP 130 126 128 121 - [id number] -  Diastolic BP 84 74 84 67 - 80 -  Wt. (Lbs) 356.12 354.6 351.12 - 350 332.2 -  BMI 67.29 64.86 64.22 - 64.02 60.76 -  Some encounter information is confidential and restricted. Go to Review Flowsheets activity to see all data.       Morbid obesity  Patient re-educated about  the importance of commitment to a  minimum of 150 minutes of exercise per week as able.  The importance of healthy food choices with portion control discussed, as well as eating regularly and within a 12 hour window most days. The need to choose "clean , green" food 50 to 75% of the time is discussed, as well as to make water the primary drink and set a goal of 64 ounces water daily.    Weight /BMI 04/06/2021 01/19/2021 01/02/2021  WEIGHT 356 lb 1.9 oz 354 lb 9.6 oz 351 lb 1.9 oz  HEIGHT 5\' 1"  5\' 2"  5\' 2"   BMI 67.29 kg/m2 64.86 kg/m2 64.22 kg/m2  Some encounter information is confidential and restricted. Go to Review Flowsheets activity to see all data.  Start half phentermine daily  Seasonal allergies Currently symptomatic , start daily singulair

## 2021-04-08 NOTE — Assessment & Plan Note (Signed)
Controlled, no change in medication DASH diet and commitment to daily physical activity for a minimum of 30 minutes discussed and encouraged, as a part of hypertension management. The importance of attaining a healthy weight is also discussed.  BP/Weight 04/06/2021 01/19/2021 01/02/2021 11/14/2020 11/13/2020 08/18/2020 08/02/2020  Systolic BP 130 126 128 121 - [id number] -  Diastolic BP 84 74 84 67 - 80 -  Wt. (Lbs) 356.12 354.6 351.12 - 350 332.2 -  BMI 67.29 64.86 64.22 - 64.02 60.76 -  Some encounter information is confidential and restricted. Go to Review Flowsheets activity to see all data.

## 2021-04-08 NOTE — Assessment & Plan Note (Signed)
°  Patient re-educated about  the importance of commitment to a  minimum of 150 minutes of exercise per week as able.  The importance of healthy food choices with portion control discussed, as well as eating regularly and within a 12 hour window most days. The need to choose "clean , green" food 50 to 75% of the time is discussed, as well as to make water the primary drink and set a goal of 64 ounces water daily.    Weight /BMI 04/06/2021 01/19/2021 01/02/2021  WEIGHT 356 lb 1.9 oz 354 lb 9.6 oz 351 lb 1.9 oz  HEIGHT 5\' 1"  5\' 2"  5\' 2"   BMI 67.29 kg/m2 64.86 kg/m2 64.22 kg/m2  Some encounter information is confidential and restricted. Go to Review Flowsheets activity to see all data.    Start half phentermine daily

## 2021-04-08 NOTE — Assessment & Plan Note (Signed)
Currently symptomatic , start daily singulair

## 2021-04-10 ENCOUNTER — Other Ambulatory Visit: Payer: Self-pay | Admitting: *Deleted

## 2021-04-10 DIAGNOSIS — M0609 Rheumatoid arthritis without rheumatoid factor, multiple sites: Secondary | ICD-10-CM

## 2021-04-10 MED ORDER — RINVOQ 15 MG PO TB24: 15.0000 mg | ORAL_TABLET | Freq: Every day | ORAL | 0 refills | Status: DC

## 2021-04-10 NOTE — Telephone Encounter (Signed)
Ok to provide 90-supply

## 2021-04-10 NOTE — Telephone Encounter (Signed)
Refill request received via fax  Next Visit: 04/20/2021  Last Visit: 01/19/2021  Last Fill: 01/04/2021  ER:DEYCXKGYJE arthritis of multiple sites with negative rheumatoid factor   Current Dose per office note 01/19/2021:  Rinvoq 15 mg 1 tablet by mouth daily  Labs: 01/19/2021 Lipid panel is normal.  MCV is low, most likely due to iron deficiency.  Platelets are mildly increased.  CMP is a stable.  TB Gold: 01/19/2021 Neg    Okay to refill Rinvoq?

## 2021-04-20 ENCOUNTER — Encounter: Payer: Self-pay | Admitting: Physician Assistant

## 2021-04-20 ENCOUNTER — Other Ambulatory Visit: Payer: Self-pay

## 2021-04-20 ENCOUNTER — Ambulatory Visit (INDEPENDENT_AMBULATORY_CARE_PROVIDER_SITE_OTHER): Payer: Medicare HMO | Admitting: Physician Assistant

## 2021-04-20 VITALS — BP 134/85 | HR 61 | Ht 61.0 in | Wt 351.4 lb

## 2021-04-20 DIAGNOSIS — Z6841 Body Mass Index (BMI) 40.0 and over, adult: Secondary | ICD-10-CM | POA: Diagnosis not present

## 2021-04-20 DIAGNOSIS — M0609 Rheumatoid arthritis without rheumatoid factor, multiple sites: Secondary | ICD-10-CM

## 2021-04-20 DIAGNOSIS — Z79899 Other long term (current) drug therapy: Secondary | ICD-10-CM

## 2021-04-20 DIAGNOSIS — R7303 Prediabetes: Secondary | ICD-10-CM | POA: Diagnosis not present

## 2021-04-20 DIAGNOSIS — Z8659 Personal history of other mental and behavioral disorders: Secondary | ICD-10-CM

## 2021-04-20 DIAGNOSIS — E559 Vitamin D deficiency, unspecified: Secondary | ICD-10-CM

## 2021-04-20 DIAGNOSIS — M5030 Other cervical disc degeneration, unspecified cervical region: Secondary | ICD-10-CM

## 2021-04-20 DIAGNOSIS — M19072 Primary osteoarthritis, left ankle and foot: Secondary | ICD-10-CM

## 2021-04-20 DIAGNOSIS — M19071 Primary osteoarthritis, right ankle and foot: Secondary | ICD-10-CM

## 2021-04-20 DIAGNOSIS — M19041 Primary osteoarthritis, right hand: Secondary | ICD-10-CM | POA: Diagnosis not present

## 2021-04-20 DIAGNOSIS — Z96653 Presence of artificial knee joint, bilateral: Secondary | ICD-10-CM

## 2021-04-20 DIAGNOSIS — E890 Postprocedural hypothyroidism: Secondary | ICD-10-CM

## 2021-04-20 DIAGNOSIS — Z8669 Personal history of other diseases of the nervous system and sense organs: Secondary | ICD-10-CM

## 2021-04-20 DIAGNOSIS — Z8719 Personal history of other diseases of the digestive system: Secondary | ICD-10-CM

## 2021-04-20 DIAGNOSIS — I10 Essential (primary) hypertension: Secondary | ICD-10-CM | POA: Diagnosis not present

## 2021-04-20 DIAGNOSIS — Z8261 Family history of arthritis: Secondary | ICD-10-CM

## 2021-04-20 DIAGNOSIS — M19042 Primary osteoarthritis, left hand: Secondary | ICD-10-CM

## 2021-04-20 DIAGNOSIS — I5032 Chronic diastolic (congestive) heart failure: Secondary | ICD-10-CM

## 2021-04-20 MED ORDER — METHOTREXATE SODIUM CHEMO INJECTION (PF) 50 MG/2ML: INTRAMUSCULAR | 0 refills | Status: DC

## 2021-04-20 NOTE — Telephone Encounter (Signed)
Please review pended MTX refill and send to the pharmacy. Thanks!

## 2021-04-20 NOTE — Patient Instructions (Signed)
Standing Labs °We placed an order today for your standing lab work.  ° °Please have your standing labs drawn in May and every 3 months  ° ° °If possible, please have your labs drawn 2 weeks prior to your appointment so that the provider can discuss your results at your appointment. ° °Please note that you may see your imaging and lab results in MyChart before we have reviewed them. °We may be awaiting multiple results to interpret others before contacting you. °Please allow our office up to 72 hours to thoroughly review all of the results before contacting the office for clarification of your results. ° °We have open lab daily: °Monday through Thursday from 1:30-4:30 PM and Friday from 1:30-4:00 PM °at the office of Dr. Shaili Deveshwar, [facility].   °Please be advised, all patients with office appointments requiring lab work will take precedent over walk-in lab work.  °If possible, please come for your lab work on Monday and Friday afternoons, as you may experience shorter wait times. °The office is located at [address], [address], [place], [place] [address] °No appointment is necessary.   °Labs are drawn by [facility]. Please bring your co-pay at the time of your lab draw.  You may receive a bill from [facility] for your lab work. ° °Please note if you are on Hydroxychloroquine and and an order has been placed for a Hydroxychloroquine level, you will need to have it drawn 4 hours or more after your last dose. ° °If you wish to have your labs drawn at another location, please call the office 24 hours in advance to send orders. ° °If you have any questions regarding directions or hours of operation,  °please call [phone].   °As a reminder, please drink plenty of water prior to coming for your lab work. Thanks! ° °

## 2021-04-21 LAB — CBC WITH DIFFERENTIAL/PLATELET
Absolute Monocytes: 627 cells/uL (ref 200–950)
Basophils Absolute: 38 cells/uL (ref 0–200)
Basophils Relative: 0.6 %
Eosinophils Absolute: 128 cells/uL (ref 15–500)
Eosinophils Relative: 2 %
HCT: 41.8 % (ref 35.0–45.0)
Hemoglobin: 12.8 g/dL (ref 11.7–15.5)
Lymphs Abs: 2355 cells/uL (ref 850–3900)
MCH: 24.2 pg — ABNORMAL LOW (ref 27.0–33.0)
MCHC: 30.6 g/dL — ABNORMAL LOW (ref 32.0–36.0)
MCV: 78.9 fL — ABNORMAL LOW (ref 80.0–100.0)
MPV: 10.1 fL (ref 7.5–12.5)
Monocytes Relative: 9.8 %
Neutro Abs: 3251 cells/uL (ref 1500–7800)
Neutrophils Relative %: 50.8 %
Platelets: 384 10*3/uL (ref 140–400)
RBC: 5.3 10*6/uL — ABNORMAL HIGH (ref 3.80–5.10)
RDW: 14.5 % (ref 11.0–15.0)
Total Lymphocyte: 36.8 %
WBC: 6.4 10*3/uL (ref 3.8–10.8)

## 2021-04-21 LAB — COMPLETE METABOLIC PANEL WITH GFR
AG Ratio: 1.1 (calc) (ref 1.0–2.5)
ALT: 12 U/L (ref 6–29)
AST: 15 U/L (ref 10–35)
Albumin: 4.3 g/dL (ref 3.6–5.1)
Alkaline phosphatase (APISO): 87 U/L (ref 37–153)
BUN: 18 mg/dL (ref 7–25)
CO2: 28 mmol/L (ref 20–32)
Calcium: 9.8 mg/dL (ref 8.6–10.4)
Chloride: 98 mmol/L (ref 98–110)
Creat: 0.9 mg/dL (ref 0.50–1.03)
Globulin: 3.9 g/dL (calc) — ABNORMAL HIGH (ref 1.9–3.7)
Glucose, Bld: 95 mg/dL (ref 65–99)
Potassium: 4.2 mmol/L (ref 3.5–5.3)
Sodium: 138 mmol/L (ref 135–146)
Total Bilirubin: 0.6 mg/dL (ref 0.2–1.2)
Total Protein: 8.2 g/dL — ABNORMAL HIGH (ref 6.1–8.1)
eGFR: 74 mL/min/{1.73_m2} (ref >=60)

## 2021-04-23 NOTE — Progress Notes (Signed)
Total protein is borderline elevated-8.2.  globulin is elevated-3.9-stable.  Rest of CMP WNL.   CBC stable. We will continue to monitor lab work closely.

## 2021-04-27 DIAGNOSIS — E038 Other specified hypothyroidism: Secondary | ICD-10-CM | POA: Diagnosis not present

## 2021-05-03 ENCOUNTER — Other Ambulatory Visit: Payer: Self-pay

## 2021-05-03 ENCOUNTER — Ambulatory Visit (INDEPENDENT_AMBULATORY_CARE_PROVIDER_SITE_OTHER): Payer: Medicare HMO | Admitting: Internal Medicine

## 2021-05-03 ENCOUNTER — Encounter: Payer: Self-pay | Admitting: Internal Medicine

## 2021-05-03 DIAGNOSIS — J069 Acute upper respiratory infection, unspecified: Secondary | ICD-10-CM | POA: Diagnosis not present

## 2021-05-03 MED ORDER — BENZONATATE 100 MG PO CAPS: 100.0000 mg | ORAL_CAPSULE | Freq: Two times a day (BID) | ORAL | 0 refills | Status: DC | PRN

## 2021-05-03 MED ORDER — AZITHROMYCIN 250 MG PO TABS: ORAL_TABLET | ORAL | 0 refills | Status: AC

## 2021-05-03 NOTE — Progress Notes (Signed)
Virtual Visit via Telephone Note   This visit type was conducted due to national recommendations for restrictions regarding the COVID-19 Pandemic (e.g. social distancing) in an effort to limit this patient's exposure and mitigate transmission in our community.  Due to her co-morbid illnesses, this patient is at least at moderate risk for complications without adequate follow up.  This format is felt to be most appropriate for this patient at this time.  The patient did not have access to video technology/had technical difficulties with video requiring transitioning to audio format only (telephone).  All issues noted in this document were discussed and addressed.  No physical exam could be performed with this format.  Evaluation Performed:  Follow-up visit  Date:  05/03/2021   ID:  [name], DOB [birth year], MRN [id number]  Patient Location: Home Provider Location: Office/Clinic  Participants: Patient Location of Patient: Home Location of Provider: Telehealth Consent was obtain for visit to be over via telehealth. I verified that I am speaking with the correct person using two identifiers.  PCP:  Kerri Perches, MD   Chief Complaint: Cough, nasal congestion and sore throat  History of Present Illness:    [name] is a 61 y.o. female who has a televisit for complaint of cough, nasal congestion and sore throat for more than a week now.  She also had fever yesterday.  Her home COVID test was negative.  Of note, she is on immunosuppressive for her history of RA.  She currently denies any dyspnea or wheezing.  The patient does not have symptoms concerning for COVID-19 infection (fever, chills, cough, or new shortness of breath).   Past Medical, Surgical, Social History, Allergies, and Medications have been Reviewed.  Past Medical History:  Diagnosis Date   Chronic back pain    Degenerative joint disease    Left TKA in 07/2009-Dr. Landau   Depression 2001    doesn't take any meds   Headache(784.0)    occasionally   Human bite of forearm 11/30/2019   Hypothyroidism    was on Levothyroxine-has been off x 4 months   Joint pain    Joint swelling    Morbid obesity (HCC)    Nocturia    Osteoarthritis of right knee 07/13/2013   Peripheral edema    to right leg;takes Furosemide occasionally and hasnt had one in a month   Syncope    with palpitations; evaluated by [facility] in 2008 with normal echo   Past Surgical History:  Procedure Laterality Date   ABDOMINAL HYSTERECTOMY  1995    no oophorectomy   CARPAL TUNNEL RELEASE  1991   Bilateral   CESAREAN SECTION     X2   CHOLECYSTECTOMY     CHOLECYSTECTOMY, LAPAROSCOPIC  2007   Dr. Lovell Sheehan   COLONOSCOPY N/A 03/03/2013   Procedure: COLONOSCOPY;  Surgeon: Malissa Hippo, MD;  Location: [facility];  Service: Endoscopy;  Laterality: N/A;  930   CRANIECTOMY SUBOCCIPITAL W/ CERVICAL LAMINECTOMY / CHIARI  2007   Repair of Arnold-Chiari malformation   EYE SURGERY     bilateral cataract surgery   JOINT REPLACEMENT Left 2011   KNEE ARTHROSCOPY  1995   Dr. Cleophas Dunker    THYROIDECTOMY, PARTIAL  2004   Left lobectomy - large benign nodule   TONSILLECTOMY     TOTAL KNEE ARTHROPLASTY  07/2009   Left knee , Dr. Dion Saucier    TOTAL KNEE ARTHROPLASTY Right 07/13/2013   Procedure: TOTAL  KNEE ARTHROPLASTY;  Surgeon: Eulas Post, MD;  Location: [facility] OR;  Service: Orthopedics;  Laterality: Right;     Current Meds  Medication Sig   azithromycin (ZITHROMAX) 250 MG tablet Take 2 tablets on day 1, then 1 tablet daily on days 2 through 5   benzonatate (TESSALON) 100 MG capsule Take 1 capsule (100 mg total) by mouth 2 (two) times daily as needed for cough.   famotidine (PEPCID) 40 MG tablet Take 1 tablet (40 mg total) by mouth daily.   folic acid (FOLVITE) 1 MG tablet TAKE 2 TABLETS BY MOUTH EVERY DAY   hydrochlorothiazide (HYDRODIURIL) 25 MG tablet TAKE 1 TABLET BY MOUTH EVERY DAY    hydrOXYzine (ATARAX/VISTARIL) 50 MG tablet TAKE ONE TABLET AT BEDTIME FOR SLEEP   metFORMIN (GLUCOPHAGE) 500 MG tablet Take 1 tablet (500 mg total) by mouth 2 (two) times daily with a meal.   Methotrexate Sodium (METHOTREXATE, PF,) 50 MG/2ML injection Inject 1 ML (25 mg total) into the skin once week.   montelukast (SINGULAIR) 10 MG tablet Take 1 tablet (10 mg total) by mouth at bedtime.   phentermine (ADIPEX-P) 37.5 MG tablet Take half tablet by mouth every morning at breakfast   Tuberculin-Allergy Syringes 27G X 1/2" 1 ML MISC Use 1 syringe once weekly to inject methotrexate.   Upadacitinib ER (RINVOQ) 15 MG TB24 Take 15 mg by mouth daily.     Allergies:   Aleve [naproxen sodium], Penicillins, Adhesive [tape], and Latex   ROS:   Please see the history of present illness.     All other systems reviewed and are negative.   Labs/Other Tests and Data Reviewed:    Recent Labs: 05/22/2020: TSH 3.260 11/13/2020: B Natriuretic Peptide 33.0 04/20/2021: ALT 12; BUN 18; Creat 0.90; Hemoglobin 12.8; Platelets 384; Potassium 4.2; Sodium 138   Recent Lipid Panel Lab Results  Component Value Date/Time   CHOL 139 01/19/2021 11:12 AM   CHOL 157 05/22/2020 11:04 AM   TRIG 101 01/19/2021 11:12 AM   HDL 51 01/19/2021 11:12 AM   HDL 52 05/22/2020 11:04 AM   CHOLHDL 2.7 01/19/2021 11:12 AM   LDLCALC 70 01/19/2021 11:12 AM    Wt Readings from Last 3 Encounters:  04/20/21 (!) 351 lb 6.4 oz (159.4 kg)  04/06/21 (!) 356 lb 1.9 oz (161.5 kg)  01/19/21 (!) 354 lb 9.6 oz (160.8 kg)     ASSESSMENT & PLAN:    URTI Started empiric azithromycin due to her risk factors and persistent symptoms Tessalon as needed for cough Advised to maintain adequate hydration  Time:   Today, I have spent 9 minutes reviewing the chart, including problem list, medications, and with the patient with telehealth technology discussing the above problems.   Medication Adjustments/Labs and Tests Ordered: Current medicines  are reviewed at length with the patient today.  Concerns regarding medicines are outlined above.   Tests Ordered: No orders of the defined types were placed in this encounter.   Medication Changes: Meds ordered this encounter  Medications   azithromycin (ZITHROMAX) 250 MG tablet    Sig: Take 2 tablets on day 1, then 1 tablet daily on days 2 through 5    Dispense:  6 tablet    Refill:  0   benzonatate (TESSALON) 100 MG capsule    Sig: Take 1 capsule (100 mg total) by mouth 2 (two) times daily as needed for cough.    Dispense:  20 capsule    Refill:  0  Note: This dictation was prepared with Moore dictation along with smaller phrase technology. Similar sounding words can be transcribed inadequately or may not be corrected upon review. Any transcriptional errors that result from this process are unintentional.      Disposition:  Follow up  Signed, Anabel Halonutwik K Walden Statz, MD  05/03/2021 11:01 AM     [facility]

## 2021-05-06 ENCOUNTER — Other Ambulatory Visit: Payer: Self-pay | Admitting: Family Medicine

## 2021-05-11 DIAGNOSIS — I10 Essential (primary) hypertension: Secondary | ICD-10-CM | POA: Diagnosis not present

## 2021-05-11 DIAGNOSIS — Z6841 Body Mass Index (BMI) 40.0 and over, adult: Secondary | ICD-10-CM | POA: Diagnosis not present

## 2021-05-18 ENCOUNTER — Other Ambulatory Visit (HOSPITAL_COMMUNITY)
Admission: RE | Admit: 2021-05-18 | Discharge: 2021-05-18 | Disposition: A | Discharge status: Home / Self Care | Payer: Medicare HMO | Source: Ambulatory Visit | Attending: Family Medicine | Admitting: Family Medicine

## 2021-05-18 ENCOUNTER — Other Ambulatory Visit: Payer: Self-pay

## 2021-05-18 ENCOUNTER — Ambulatory Visit (INDEPENDENT_AMBULATORY_CARE_PROVIDER_SITE_OTHER): Payer: Medicare HMO | Admitting: Family Medicine

## 2021-05-18 ENCOUNTER — Encounter: Payer: Self-pay | Admitting: Family Medicine

## 2021-05-18 DIAGNOSIS — Z1151 Encounter for screening for human papillomavirus (HPV): Secondary | ICD-10-CM | POA: Insufficient documentation

## 2021-05-18 DIAGNOSIS — Z124 Encounter for screening for malignant neoplasm of cervix: Secondary | ICD-10-CM | POA: Insufficient documentation

## 2021-05-18 DIAGNOSIS — Z01419 Encounter for gynecological examination (general) (routine) without abnormal findings: Secondary | ICD-10-CM | POA: Insufficient documentation

## 2021-05-18 DIAGNOSIS — Z1272 Encounter for screening for malignant neoplasm of vagina: Secondary | ICD-10-CM | POA: Diagnosis not present

## 2021-05-18 DIAGNOSIS — R7302 Impaired glucose tolerance (oral): Secondary | ICD-10-CM | POA: Diagnosis not present

## 2021-05-18 DIAGNOSIS — I10 Essential (primary) hypertension: Secondary | ICD-10-CM | POA: Diagnosis not present

## 2021-05-18 DIAGNOSIS — R69 Illness, unspecified: Secondary | ICD-10-CM | POA: Diagnosis not present

## 2021-05-18 LAB — POCT GLYCOSYLATED HEMOGLOBIN (HGB A1C): HbA1c, POC (prediabetic range): 6.2 % (ref 5.7–6.4)

## 2021-05-18 MED ORDER — PHENTERMINE HCL 37.5 MG PO TABS: 37.5000 mg | ORAL_TABLET | Freq: Every day | ORAL | 2 refills | Status: DC

## 2021-05-18 NOTE — Patient Instructions (Addendum)
Follow-up in 11 weeks call if you need me sooner. ? ?Dose increase in phentermine to 1 tablet once daily. ? ?PLEASE get your 2nd shingrix vaccine this is now due!! ? ?Congrats on 8 pound weight loss over the last 2 months, keep it up. ? ?Pap sent. ? ?Glycohemoglobin in office today. ? ? ?It is important that you exercise regularly at least 30 minutes 5 times a week. If you develop chest pain, have severe difficulty breathing, or feel very tired, stop exercising immediately and seek medical attention  ?Thanks for choosing [facility], we consider it a privelige to serve you. ? ?

## 2021-05-21 ENCOUNTER — Encounter: Payer: Self-pay | Admitting: Family Medicine

## 2021-05-21 DIAGNOSIS — Z1272 Encounter for screening for malignant neoplasm of vagina: Secondary | ICD-10-CM | POA: Insufficient documentation

## 2021-05-21 DIAGNOSIS — R7302 Impaired glucose tolerance (oral): Secondary | ICD-10-CM | POA: Insufficient documentation

## 2021-05-21 NOTE — Progress Notes (Signed)
? ?Vickie DragonDeirdre Y Moore     MRN: [id number]      DOB: March 16, 1961 ? ? ?HPI ?[name] is here for follow up and re-evaluation of chronic medical conditions, medication management and review of any available recent lab and radiology data.  ?Preventive health is updated, specifically  Cancer screening and Immunization.   ?Questions or concerns regarding consultations or procedures which the PT has had in the interim are  addressed. ?The PT denies any adverse reactions to current medications since the last visit.  ?Has joined weight loss clinic, still waiting o prescription states they recommend increasing dose of phentermine  ? ?ROS ?Denies recent fever or chills. ?Denies sinus pressure, nasal congestion, ear pain or sore throat. ?Denies chest congestion, productive cough or wheezing. ?Denies chest pains, palpitations and leg swelling ?Denies abdominal pain, nausea, vomiting,diarrhea or constipation.   ?Denies dysuria, frequency, hesitancy or incontinence. ?Denies  uncontrolled joint pain, swelling and limitation in mobility. ?Denies headaches, seizures, numbness, or tingling. ?Denies depression, anxiety or insomnia. ?Denies skin break down or rash. ? ? ?PE ? ?BP 126/85   Pulse 72   Ht 5\' 1"  (1.549 m)   Wt (!) 350 lb (158.8 kg)   SpO2 96%   BMI 66.13 kg/m?  ? ?Patient alert and oriented and in no cardiopulmonary distress. ? ?HEENT: No facial asymmetry, EOMI,     Neck supple . ? ?Chest: Clear to auscultation bilaterally. ? ?CVS: S1, S2 no murmurs, no S3.Regular rate. ? ?ABD: Soft non tender.  ? ?GU : uterus absent, vaginal walls pink and moist, no ulcers , no adnexal mass palpable, no inguinal adenopathy ? ?Ext: No edema ? ?MS: decreased  ROM spine, shoulders, hips and knees. ? ?Skin: Intact, no ulcerations or rash noted. ? ?Psych: Good eye contact, normal affect. Memory intact not anxious or depressed appearing. ? ?CNS: CN 2-12 intact, power,  normal throughout.no focal deficits noted. ? ? ?Assessment &  Plan ? ?Morbid obesity ? ?Patient re-educated about  the importance of commitment to a  minimum of 150 minutes of exercise per week as able. ? ?The importance of healthy food choices with portion control discussed, as well as eating regularly and within a 12 hour window most days. ?The need to choose "clean , green" food 50 to 75% of the time is discussed, as well as to make water the primary drink and set a goal of 64 ounces water daily. ? ?  ?Weight /BMI 05/18/2021 04/20/2021 04/06/2021  ?WEIGHT 350 lb 351 lb 6.4 oz 356 lb 1.9 oz  ?HEIGHT 5\' 1"  5\' 1"  5\' 1"   ?BMI 66.13 kg/m2 66.4 kg/m2 67.29 kg/m2  ?Some encounter information is confidential and restricted. Go to Review Flowsheets activity to see all data.  ? ? ? ?Prescription written ? ?IGT (impaired glucose tolerance) ?Patient educated about the importance of limiting  Carbohydrate intake , the need to commit to daily physical activity for a minimum of 30 minutes , and to commit weight loss. ?The fact that changes in all these areas will reduce or eliminate all together the development of diabetes is stressed.  ? ?Diabetic Labs Latest Ref Rng & Units 05/18/2021 04/20/2021 01/19/2021 01/02/2021 11/13/2020  ?HbA1c 5.7 - 6.4 % 6.2 - - 6.2(H) -  ?Microalbumin Not Estab. ug/mL - - - - -  ?Micro/Creat Ratio <30 mcg/mg creat - - - - -  ?Chol <200 mg/dL - - 409139 - -  ?HDL > OR = 50 mg/dL - - 51 - -  ?Calc  LDL mg/dL (calc) - - 70 - -  ?Triglycerides <150 mg/dL - - 888 - -  ?Creatinine 0.50 - 1.03 mg/dL - 2.80 0.34 - 9.17  ? ?BP/Weight 05/18/2021 04/20/2021 04/06/2021 01/19/2021 01/02/2021 11/14/2020 11/13/2020  ?Systolic BP 126 134 130 126 128 121 -  ?Diastolic BP 85 85 84 74 84 67 -  ?Wt. (Lbs) 350 351.4 356.12 354.6 351.12 - 350  ?BMI 66.13 66.4 67.29 64.86 64.22 - 64.02  ?Some encounter information is confidential and restricted. Go to Review Flowsheets activity to see all data.  ? ?Foot/eye exam completion dates 10/12/2019 07/31/2015  ?Foot Form Completion Done Done  ? ? ? ? ?Essential  hypertension ?Controlled, no change in medication ?DASH diet and commitment to daily physical activity for a minimum of 30 minutes discussed and encouraged, as a part of hypertension management. ?The importance of attaining a healthy weight is also discussed. ? ?BP/Weight 05/18/2021 04/20/2021 04/06/2021 01/19/2021 01/02/2021 11/14/2020 11/13/2020  ?Systolic BP 126 134 130 126 128 121 -  ?Diastolic BP 85 85 84 74 84 67 -  ?Wt. (Lbs) 350 351.4 356.12 354.6 351.12 - 350  ?BMI 66.13 66.4 67.29 64.86 64.22 - 64.02  ?Some encounter information is confidential and restricted. Go to Review Flowsheets activity to see all data.  ? ? ? ? ? ?Encounter for Papanicolaou smear of vagina ?Pap sent ? ?

## 2021-05-21 NOTE — Assessment & Plan Note (Addendum)
?  Patient re-educated about  the importance of commitment to a  minimum of 150 minutes of exercise per week as able. ? ?The importance of healthy food choices with portion control discussed, as well as eating regularly and within a 12 hour window most days. ?The need to choose "clean , green" food 50 to 75% of the time is discussed, as well as to make water the primary drink and set a goal of 64 ounces water daily. ? ?  ?Weight /BMI 05/18/2021 04/20/2021 04/06/2021  ?WEIGHT 350 lb 351 lb 6.4 oz 356 lb 1.9 oz  ?HEIGHT 5\' 1"  5\' 1"  5\' 1"   ?BMI 66.13 kg/m2 66.4 kg/m2 67.29 kg/m2  ?Some encounter information is confidential and restricted. Go to Review Flowsheets activity to see all data.  ? ? ? ?Prescription written ?

## 2021-05-21 NOTE — Assessment & Plan Note (Signed)
Patient educated about the importance of limiting  Carbohydrate intake , the need to commit to daily physical activity for a minimum of 30 minutes , and to commit weight loss. ?The fact that changes in all these areas will reduce or eliminate all together the development of diabetes is stressed.  ? ?Diabetic Labs Latest Ref Rng & Units 05/18/2021 04/20/2021 01/19/2021 01/02/2021 11/13/2020  ?HbA1c 5.7 - 6.4 % 6.2 - - 6.2(H) -  ?Microalbumin Not Estab. ug/mL - - - - -  ?Micro/Creat Ratio <30 mcg/mg creat - - - - -  ?Chol <200 mg/dL - - 629 - -  ?HDL > OR = 50 mg/dL - - 51 - -  ?Calc LDL mg/dL (calc) - - 70 - -  ?Triglycerides <150 mg/dL - - 528 - -  ?Creatinine 0.50 - 1.03 mg/dL - 4.13 2.44 - 0.10  ? ?BP/Weight 05/18/2021 04/20/2021 04/06/2021 01/19/2021 01/02/2021 11/14/2020 11/13/2020  ?Systolic BP 126 134 130 126 128 121 -  ?Diastolic BP 85 85 84 74 84 67 -  ?Wt. (Lbs) 350 351.4 356.12 354.6 351.12 - 350  ?BMI 66.13 66.4 67.29 64.86 64.22 - 64.02  ?Some encounter information is confidential and restricted. Go to Review Flowsheets activity to see all data.  ? ?Foot/eye exam completion dates 10/12/2019 07/31/2015  ?Foot Form Completion Done Done  ? ? ? ?

## 2021-05-21 NOTE — Assessment & Plan Note (Signed)
Pap sent ?

## 2021-05-21 NOTE — Assessment & Plan Note (Signed)
Controlled, no change in medication ?DASH diet and commitment to daily physical activity for a minimum of 30 minutes discussed and encouraged, as a part of hypertension management. ?The importance of attaining a healthy weight is also discussed. ? ?BP/Weight 05/18/2021 04/20/2021 04/06/2021 01/19/2021 01/02/2021 11/14/2020 11/13/2020  ?Systolic BP 126 134 130 126 128 121 -  ?Diastolic BP 85 85 84 74 84 67 -  ?Wt. (Lbs) 350 351.4 356.12 354.6 351.12 - 350  ?BMI 66.13 66.4 67.29 64.86 64.22 - 64.02  ?Some encounter information is confidential and restricted. Go to Review Flowsheets activity to see all data.  ? ? ? ? ?

## 2021-05-22 LAB — CYTOLOGY - PAP
Adequacy: ABSENT
Comment: NEGATIVE
Diagnosis: NEGATIVE
High risk HPV: NEGATIVE

## 2021-06-25 DIAGNOSIS — Z6841 Body Mass Index (BMI) 40.0 and over, adult: Secondary | ICD-10-CM | POA: Diagnosis not present

## 2021-06-25 DIAGNOSIS — E559 Vitamin D deficiency, unspecified: Secondary | ICD-10-CM | POA: Diagnosis not present

## 2021-07-02 ENCOUNTER — Other Ambulatory Visit: Payer: Self-pay | Admitting: Family Medicine

## 2021-07-06 DIAGNOSIS — I10 Essential (primary) hypertension: Secondary | ICD-10-CM | POA: Diagnosis not present

## 2021-07-06 DIAGNOSIS — Z6841 Body Mass Index (BMI) 40.0 and over, adult: Secondary | ICD-10-CM | POA: Diagnosis not present

## 2021-07-18 ENCOUNTER — Other Ambulatory Visit: Payer: Self-pay | Admitting: *Deleted

## 2021-07-18 DIAGNOSIS — M0609 Rheumatoid arthritis without rheumatoid factor, multiple sites: Secondary | ICD-10-CM

## 2021-07-18 DIAGNOSIS — Z79899 Other long term (current) drug therapy: Secondary | ICD-10-CM

## 2021-07-18 MED ORDER — RINVOQ 15 MG PO TB24: 15.0000 mg | ORAL_TABLET | Freq: Every day | ORAL | 0 refills | Status: DC

## 2021-07-18 NOTE — Telephone Encounter (Signed)
Refill request received via fax ? ?Next Visit: 09/21/2021 ? ?Last Visit: 04/20/2021 ? ?Last Fill: 04/10/2021 ? ?[id number] arthritis of multiple sites with negative rheumatoid factor  ? ?Current Dose per office note 04/20/2021: Rinvoq 15 mg 1 tablet by mouth daily ? ?Labs: 04/20/2021 Total protein is borderline elevated-8.2.  globulin is elevated-3.9-stable.  Rest of CMP WNL. CBC stable. ? ?TB Gold: 01/19/2021 Neg   ? ?Patient advised she is due to update labs. Patient states she will try to update tomorrow if not then she will update Monday. Lab orders released.  ? ?Okay to refill Rinvoq?  ?

## 2021-07-23 ENCOUNTER — Telehealth: Payer: Self-pay | Admitting: Rheumatology

## 2021-07-23 DIAGNOSIS — Z79899 Other long term (current) drug therapy: Secondary | ICD-10-CM

## 2021-07-23 NOTE — Telephone Encounter (Signed)
Patient called the office stating she had lab orders released to [facility] and she went to get her labs done but states "they're acting silly". Patient requests lab orders be sent to [facility] instead and requests a call once those have been sent.  ?

## 2021-07-23 NOTE — Telephone Encounter (Signed)
Patient advised lab orders have been released to [facility].  ?

## 2021-07-24 DIAGNOSIS — Z79899 Other long term (current) drug therapy: Secondary | ICD-10-CM | POA: Diagnosis not present

## 2021-07-25 LAB — CMP14+EGFR
ALT: 13 IU/L (ref 0–32)
AST: 17 IU/L (ref 0–40)
Albumin/Globulin Ratio: 1.1 — ABNORMAL LOW (ref 1.2–2.2)
Albumin: 3.9 g/dL (ref 3.8–4.9)
Alkaline Phosphatase: 96 IU/L (ref 44–121)
BUN/Creatinine Ratio: 14 (ref 9–23)
BUN: 11 mg/dL (ref 6–24)
Bilirubin Total: 0.5 mg/dL (ref 0.0–1.2)
CO2: 25 mmol/L (ref 20–29)
Calcium: 9.1 mg/dL (ref 8.7–10.2)
Chloride: 101 mmol/L (ref 96–106)
Creatinine, Ser: 0.8 mg/dL (ref 0.57–1.00)
Globulin, Total: 3.5 g/dL (ref 1.5–4.5)
Glucose: 100 mg/dL — ABNORMAL HIGH (ref 70–99)
Potassium: 4.3 mmol/L (ref 3.5–5.2)
Sodium: 141 mmol/L (ref 134–144)
Total Protein: 7.4 g/dL (ref 6.0–8.5)
eGFR: 85 mL/min/{1.73_m2} (ref >59)

## 2021-07-25 LAB — CBC WITH DIFFERENTIAL/PLATELET
Basophils Absolute: 0 10*3/uL (ref 0.0–0.2)
Basos: 1 %
EOS (ABSOLUTE): 0.2 10*3/uL (ref 0.0–0.4)
Eos: 2 %
Hematocrit: 35.7 % (ref 34.0–46.6)
Hemoglobin: 11.4 g/dL (ref 11.1–15.9)
Immature Grans (Abs): 0 10*3/uL (ref 0.0–0.1)
Immature Granulocytes: 0 %
Lymphocytes Absolute: 3.1 10*3/uL (ref 0.7–3.1)
Lymphs: 40 %
MCH: 24.6 pg — ABNORMAL LOW (ref 26.6–33.0)
MCHC: 31.9 g/dL (ref 31.5–35.7)
MCV: 77 fL — ABNORMAL LOW (ref 79–97)
Monocytes Absolute: 0.6 10*3/uL (ref 0.1–0.9)
Monocytes: 8 %
Neutrophils Absolute: 4 10*3/uL (ref 1.4–7.0)
Neutrophils: 49 %
Platelets: 407 10*3/uL (ref 150–450)
RBC: 4.63 x10E6/uL (ref 3.77–5.28)
RDW: 15 % (ref 11.7–15.4)
WBC: 7.9 10*3/uL (ref 3.4–10.8)

## 2021-07-25 NOTE — Progress Notes (Signed)
CBC and CMP are stable.

## 2021-08-03 ENCOUNTER — Ambulatory Visit: Payer: Medicare HMO | Admitting: Family Medicine

## 2021-08-16 ENCOUNTER — Ambulatory Visit: Payer: Medicare HMO | Admitting: Family Medicine

## 2021-08-27 ENCOUNTER — Ambulatory Visit (INDEPENDENT_AMBULATORY_CARE_PROVIDER_SITE_OTHER): Payer: Medicare HMO

## 2021-08-27 DIAGNOSIS — Z1231 Encounter for screening mammogram for malignant neoplasm of breast: Secondary | ICD-10-CM | POA: Diagnosis not present

## 2021-08-27 DIAGNOSIS — Z0000 Encounter for general adult medical examination without abnormal findings: Secondary | ICD-10-CM | POA: Diagnosis not present

## 2021-08-27 NOTE — Progress Notes (Signed)
Subjective:   [name] is a 61 y.o. female who presents for Medicare Annual (Subsequent) preventive examination.  Review of Systems     Cardiac Risk Factors include: dyslipidemia;sedentary lifestyle;obesity (BMI >30kg/m2)     Objective:    There were no vitals filed for this visit. There is no height or weight on file to calculate BMI.     08/27/2021    9:01 AM 08/02/2020    9:52 AM 01/25/2018    3:35 PM 08/14/2017    6:46 PM 08/14/2017   12:59 PM 08/14/2017   10:53 AM 07/13/2013    1:32 PM  Advanced Directives  Does Patient Have a Medical Advance Directive? No No No  No No Patient does not have advance directive;Patient would not like information  Would patient like information on creating a medical advance directive? Yes (ED - Information included in AVS) No - Patient declined   No - Patient declined    Pre-existing out of facility DNR order (yellow form or pink MOST form)       No     Information is confidential and restricted. Go to Review Flowsheets to unlock data.    Current Medications (verified) Outpatient Encounter Medications as of 08/27/2021  Medication Sig   benzonatate (TESSALON) 100 MG capsule Take 1 capsule (100 mg total) by mouth 2 (two) times daily as needed for cough.   famotidine (PEPCID) 40 MG tablet Take 1 tablet (40 mg total) by mouth daily.   folic acid (FOLVITE) 1 MG tablet TAKE 2 TABLETS BY MOUTH EVERY DAY   hydrochlorothiazide (HYDRODIURIL) 25 MG tablet TAKE 1 TABLET BY MOUTH EVERY DAY   metFORMIN (GLUCOPHAGE) 500 MG tablet TAKE 1 TABLET BY MOUTH 2 TIMES DAILY WITH A MEAL.   Methotrexate Sodium (METHOTREXATE, PF,) 50 MG/2ML injection Inject 1 ML (25 mg total) into the skin once week.   montelukast (SINGULAIR) 10 MG tablet TAKE 1 TABLET BY MOUTH EVERYDAY AT BEDTIME   phentermine (ADIPEX-P) 37.5 MG tablet Take 1 tablet (37.5 mg total) by mouth daily before breakfast.   Tuberculin-Allergy Syringes 27G X 1/2" 1 ML MISC Use 1 syringe once weekly  to inject methotrexate.   Upadacitinib ER (RINVOQ) 15 MG TB24 Take 15 mg by mouth daily.   No facility-administered encounter medications on file as of 08/27/2021.    Allergies (verified) Aleve [naproxen sodium], Penicillins, Adhesive [tape], and Latex   History: Past Medical History:  Diagnosis Date   Allergy    Anxiety    Sometimes   Chronic back pain    Degenerative joint disease    Left TKA in 07/2009-Dr. Landau   Depression 2001   doesn't take any meds   Headache(784.0)    occasionally   Human bite of forearm 11/30/2019   Hypothyroidism    was on Levothyroxine-has been off x 4 months   Joint pain    Joint swelling    Morbid obesity ([place])    Nocturia    Osteoarthritis of right knee 07/13/2013   Peripheral edema    to right leg;takes Furosemide occasionally and hasnt had one in a month   Syncope    with palpitations; evaluated by [facility] in 2008 with normal echo   Past Surgical History:  Procedure Laterality Date   ABDOMINAL HYSTERECTOMY  1995   no oophorectomy   CARPAL TUNNEL RELEASE  1991   Bilateral   CESAREAN SECTION     X2   CHOLECYSTECTOMY     CHOLECYSTECTOMY, LAPAROSCOPIC  2007   Dr. Arnoldo Morale   COLONOSCOPY N/A 03/03/2013   Procedure: COLONOSCOPY;  Surgeon: Rogene Houston, MD;  [facility];  Service: Endoscopy;  Laterality: N/A;  930   CRANIECTOMY [place] / CHIARI  2007   Repair of Arnold-Chiari malformation   EYE SURGERY     bilateral cataract surgery   JOINT REPLACEMENT Left 2011   KNEE ARTHROSCOPY  1995   Dr. Durward Fortes    THYROIDECTOMY, PARTIAL  2004   Left lobectomy - large benign nodule   TONSILLECTOMY     TOTAL KNEE ARTHROPLASTY  07/2009   Left knee , Dr. Mardelle Matte    TOTAL KNEE ARTHROPLASTY Right 07/13/2013   Procedure: TOTAL KNEE ARTHROPLASTY;  Surgeon: Johnny Bridge, MD;  Location: [place];  Service: Orthopedics;  Laterality: Right;   Family History  Problem Relation Age of  Onset   Diabetes Mother    Depression Mother    Diabetes Father        emphsema    Hyperlipidemia Father    Emphysema Father    Rheum arthritis Sister    Breast cancer Sister    Cancer Sister    Healthy Daughter    Healthy Daughter    Healthy Daughter    Colon cancer Neg Hx    Social History   Socioeconomic History   Marital status: Significant Other    Spouse name: Not on file   Number of children: 3   Years of education: 15   Highest education level: 12th grade  Occupational History   Occupation: Employed seeking disabilty 2012  Tobacco Use   Smoking status: Never   Smokeless tobacco: Never  Vaping Use   Vaping Use: Never used  Substance and Sexual Activity   Alcohol use: Not Currently    Comment: occ   Drug use: No   Sexual activity: Yes    Birth control/protection: Condom, Surgical  Other Topics Concern   Not on file  Social History Narrative   Is in a new relationship    Social Determinants of Health   Financial Resource Strain: Low Risk  (08/02/2020)   Overall Financial Resource Strain (CARDIA)    Difficulty of Paying Living Expenses: Not hard at all  Food Insecurity: No Food Insecurity (08/27/2021)   Hunger Vital Sign    Worried About Running Out of Food in the Last Year: Never true    Ran Out of Food in the Last Year: Never true  Transportation Needs: No Transportation Needs (08/27/2021)   PRAPARE - Hydrologist (Medical): No    Lack of Transportation (Non-Medical): No  Physical Activity: Inactive (08/27/2021)   Exercise Vital Sign    Days of Exercise per Week: 0 days    Minutes of Exercise per Session: 0 min  Stress: No Stress Concern Present (08/02/2020)   [place]    Feeling of Stress : Not at all  Social Connections: Moderately Integrated (08/02/2020)   Social Connection and Isolation Panel [NHANES]    Frequency of Communication with Friends and Family:  More than three times a week    Frequency of Social Gatherings with Friends and Family: More than three times a week    Attends Religious Services: More than 4 times per year    Active Member of [employer] or Organizations: Yes    Attends Archivist Meetings: More than 4 times per year    Marital Status:  Never married    Tobacco Counseling Counseling given: Not Answered   Clinical Intake:  Pre-visit preparation completed: Yes  Pain : No/denies pain     Nutritional Status: BMI > 30  Obese Diabetes: No  How often do you need to have someone help you when you read instructions, pamphlets, or other written materials from your doctor or pharmacy?: 1 - Never  Diabetic?no  Interpreter Needed?: No      Activities of Daily Living    08/23/2021    9:30 AM  In your present state of health, do you have any difficulty performing the following activities:  Hearing? 0  Vision? 0  Difficulty concentrating or making decisions? 0  Walking or climbing stairs? 1  Dressing or bathing? 0  Doing errands, shopping? 0  Preparing Food and eating ? N  Using the Toilet? N  In the past six months, have you accidently leaked urine? Y  Do you have problems with loss of bowel control? N  Managing your Medications? N  Managing your Finances? N  Housekeeping or managing your Housekeeping? Y    Patient Care Team: Fayrene Helper, MD as PCP - General [facility], Alphonse Guild, MD as PCP - Cardiology (Cardiology) Rothbart, Cristopher Estimable, MD (Inactive) (Cardiology) Marchia Bond, MD (Orthopedic Surgery) Edgardo Roys, PsyD (Psychology)  Indicate any recent Medical Services you may have received from other than [facility] providers in the past year (date may be approximate).     Assessment:   This is a routine wellness examination for Riyan.  Hearing/Vision screen No results found.  Dietary issues and exercise activities discussed: Current Exercise Habits: The patient does not participate  in regular exercise at present, Exercise limited by: orthopedic condition(s)   Goals Addressed             This Visit's Progress    Patient Stated   On track    Take more time for myself and be with friends more      Patient Stated       Lose some weight and get knees ready for trip in August       Depression Screen    05/18/2021    8:54 AM 05/03/2021   10:35 AM 04/06/2021    8:19 AM 01/02/2021   10:30 AM 12/14/2020    8:16 AM 10/04/2020    7:46 AM 08/02/2020    9:54 AM  PHQ 2/9 Scores  PHQ - 2 Score 0 0 0 1 0 0 0  PHQ- 9 Score    4       Fall Risk    08/23/2021    9:30 AM 05/18/2021    8:54 AM 05/03/2021   10:35 AM 04/06/2021    8:19 AM 01/02/2021    9:23 AM  Fall Risk   Falls in the past year? 1 1 0 1   Number falls in past yr: 1 0 0 1 1  Injury with Fall? 0 0 0 0 1  Risk for fall due to :  No Fall Risks No Fall Risks    Follow up  Falls evaluation completed Falls evaluation completed      [place]:  Any stairs in or around the home? Yes  If so, are there any without handrails? Yes  Home free of loose throw rugs in walkways, pet beds, electrical cords, etc? No  Adequate lighting in your home to reduce risk of falls? No   ASSISTIVE DEVICES UTILIZED TO  PREVENT FALLS:  Life alert? No  Use of a cane, walker or w/c? No  Grab bars in the bathroom? Yes  Shower chair or bench in shower? No  Elevated toilet seat or a handicapped toilet? Yes    Cognitive Function:        08/27/2021    9:05 AM 01/01/2018    3:30 PM  6CIT Screen  What Year? 0 points 0 points  What month? 0 points 0 points  What time? 0 points 0 points  Count back from 20 0 points 0 points  Months in reverse 0 points 0 points  Repeat phrase 0 points 0 points  Total Score 0 points 0 points    Immunizations Immunization History  Administered Date(s) Administered   Influenza Split 01/16/2012   Influenza Whole 02/23/2007, 12/07/2008   Influenza,inj,Quad PF,6+  Mos 12/07/2012, 02/15/2014, 11/28/2014, 01/09/2016, 04/07/2017, 12/18/2017, 02/16/2019, 12/09/2019, 01/02/2021   Moderna Sars-Covid-2 Vaccination 06/09/2019, 07/07/2019, 03/06/2020, 10/10/2020   Pneumococcal Polysaccharide-23 07/31/2015   Td 10/23/2005   Tdap 11/30/2019   Zoster Recombinat (Shingrix) 02/02/2021, 04/06/2021    TDAP status: Up to date  Flu Vaccine status: Up to date  Pneumococcal vaccine status: Up to date  Covid-19 vaccine status: Completed vaccines  Qualifies for Shingles Vaccine? Yes   Zostavax completed No   Shingrix Completed?: Yes  Screening Tests Health Maintenance  Topic Date Due   COVID-19 Vaccine (5 - Booster for Moderna series) 12/05/2020   INFLUENZA VACCINE  10/16/2021   HEMOGLOBIN A1C  11/18/2021   MAMMOGRAM  10/17/2022   COLONOSCOPY (Pts 45-49yrs Insurance coverage will need to be confirmed)  03/04/2023   PAP SMEAR-Modifier  05/18/2024   TETANUS/TDAP  11/29/2029   Hepatitis C Screening  Completed   HIV Screening  Completed   Zoster Vaccines- Shingrix  Completed   HPV VACCINES  Aged Out   FOOT EXAM  Discontinued   OPHTHALMOLOGY EXAM  Discontinued   URINE MICROALBUMIN  Discontinued    Health Maintenance  Health Maintenance Due  Topic Date Due   COVID-19 Vaccine (5 - Booster for Moderna series) 12/05/2020    Colorectal cancer screening: Type of screening: Colonoscopy. Completed yes. Repeat every 10 years  Mammogram status: Ordered today. Pt provided with contact info and advised to call to schedule appt.     Lung Cancer Screening: (Low Dose CT Chest recommended if Age 3-80 years, 30 pack-year currently smoking OR have quit w/in 15years.) does not qualify.   Lung Cancer Screening Referral: na  Additional Screening:  Hepatitis C Screening: does qualify; Completed yes  Vision Screening: Recommended annual ophthalmology exams for early detection of glaucoma and other disorders of the eye. Is the patient up to date with their annual  eye exam?  Yes  Who is the provider or what is the name of the office in which the patient attends annual eye exams? [facility] dr Debe Coder  If pt is not established with a provider, would they like to be referred to a provider to establish care? No .   Dental Screening: Recommended annual dental exams for proper oral hygiene  Community Resource Referral / Chronic Care Management: CRR required this visit?  No   CCM required this visit?  No      Plan:     I have personally reviewed and noted the following in the patient's chart:   Medical and social history Use of alcohol, tobacco or illicit drugs  Current medications and supplements including opioid prescriptions.  Functional ability and status Nutritional status  Physical activity Advanced directives List of other physicians Hospitalizations, surgeries, and ER visits in previous 12 months Vitals Screenings to include cognitive, depression, and falls Referrals and appointments  In addition, I have reviewed and discussed with patient certain preventive protocols, quality metrics, and best practice recommendations. A written personalized care plan for preventive services as well as general preventive health recommendations were provided to patient.     Eual Fines, LPN   [id number]   Nurse Notes:  Ms. Orban , Thank you for taking time to come for your Medicare Wellness Visit. I appreciate your ongoing commitment to your health goals. Please review the following plan we discussed and let me know if I can assist you in the future.   These are the goals we discussed:  Goals      Exercise 3x per week (30 min per time)     Patient Stated     Take more time for myself and be with friends more      Patient Stated     Lose some weight and get knees ready for trip in August        This is a list of the screening recommended for you and due dates:  Health Maintenance  Topic Date Due   COVID-19 Vaccine (5 - Booster for  Moderna series) 12/05/2020   Flu Shot  10/16/2021   Hemoglobin A1C  11/18/2021   Mammogram  10/17/2022   Colon Cancer Screening  03/04/2023   Pap Smear  05/18/2024   Tetanus Vaccine  11/29/2029   Hepatitis C Screening: USPSTF Recommendation to screen - Ages 18-79 yo.  Completed   HIV Screening  Completed   Zoster (Shingles) Vaccine  Completed   HPV Vaccine  Aged Out   Complete foot exam   Discontinued   Eye exam for diabetics  Discontinued   Urine Protein Check  Discontinued

## 2021-08-27 NOTE — Patient Instructions (Signed)
  [name] , Thank you for taking time to come for your Medicare Wellness Visit. I appreciate your ongoing commitment to your health goals. Please review the following plan we discussed and let me know if I can assist you in the future.   Call [phone] to schedule your mammogram that is due in August.   These are the goals we discussed:  Goals      Exercise 3x per week (30 min per time)     Patient Stated     Take more time for myself and be with friends more      Patient Stated     Lose some weight and get knees ready for trip in August        This is a list of the screening recommended for you and due dates:  Health Maintenance  Topic Date Due   COVID-19 Vaccine (5 - Booster for Moderna series) 12/05/2020   Flu Shot  10/16/2021   Hemoglobin A1C  11/18/2021   Mammogram  10/17/2022   Colon Cancer Screening  03/04/2023   Pap Smear  05/18/2024   Tetanus Vaccine  11/29/2029   Hepatitis C Screening: USPSTF Recommendation to screen - Ages 18-79 yo.  Completed   HIV Screening  Completed   Zoster (Shingles) Vaccine  Completed   HPV Vaccine  Aged Out   Complete foot exam   Discontinued   Eye exam for diabetics  Discontinued   Urine Protein Check  Discontinued

## 2021-08-28 ENCOUNTER — Ambulatory Visit (INDEPENDENT_AMBULATORY_CARE_PROVIDER_SITE_OTHER): Payer: Medicare HMO | Admitting: Family Medicine

## 2021-08-28 VITALS — BP 132/71

## 2021-08-28 DIAGNOSIS — J040 Acute laryngitis: Secondary | ICD-10-CM

## 2021-08-28 DIAGNOSIS — R052 Subacute cough: Secondary | ICD-10-CM | POA: Diagnosis not present

## 2021-08-28 DIAGNOSIS — I10 Essential (primary) hypertension: Secondary | ICD-10-CM

## 2021-08-28 MED ORDER — PREDNISONE 10 MG PO TABS: 10.0000 mg | ORAL_TABLET | Freq: Two times a day (BID) | ORAL | 0 refills | Status: DC

## 2021-08-28 MED ORDER — PROMETHAZINE-DM 6.25-15 MG/5ML PO SYRP
ORAL_SOLUTION | ORAL | 0 refills | Status: DC
Start: 2021-08-28 — End: 2021-11-16

## 2021-08-28 MED ORDER — BENZONATATE 100 MG PO CAPS: 100.0000 mg | ORAL_CAPSULE | Freq: Two times a day (BID) | ORAL | 0 refills | Status: DC | PRN

## 2021-08-28 NOTE — Patient Instructions (Signed)
F/U as before, call if youi need me sooner  Voice rest please  Tessalon perles, phenergan dM and prednisone are prescribed  Very important to take the montelukast  every day  It is important that you exercise regularly at least 30 minutes 5 times a week. If you develop chest pain, have severe difficulty breathing, or feel very tired, stop exercising immediately and seek medical attention   Think about what you will eat, plan ahead. Choose " clean, green, fresh or frozen" over canned, processed or packaged foods which are more sugary, salty and fatty. 70 to 75% of food eaten should be vegetables and fruit. Three meals at set times with snacks allowed between meals, but they must be fruit or vegetables. Aim to eat over a 12 hour period , example 7 am to 7 pm, and STOP after  your last meal of the day. Drink water,generally about 64 ounces per day, no other drink is as healthy. Fruit juice is best enjoyed in a healthy way, by EATING the fruit. Thanks for choosing [facility], we consider it a privelige to serve you.

## 2021-08-28 NOTE — Progress Notes (Signed)
Virtual Visit via Video Note  I connected with Robbin Y Chanda Busing on 08/28/21 at 11:40 AM EDT by a video enabled telemedicine application and verified that I am speaking with the correct person using two identifiers.  Location: Patient: home Provider: office   I discussed the limitations of evaluation and management by telemedicine and the availability of in person appointments. The patient expressed understanding and agreed to proceed.  History of Present Illness:   2nd episode of laryngitis this year per pt report. Starts with cough, no sinus drainage, sometimes coughs up green sputum , no fever or chills. Has been coughing x 2 weeks, all day, worse a night Observations/Objective: BP 132/71  Good communication with no confusion and intact memory. Alert and oriented x 3 Hoarse and coughing intermittently Assessment and Plan: Acute laryngitis Voice rest, fluids and short course of prednisone  Cough Tessalon perles and phenergan dm and  Short course of prednisone  Morbid obesity  Patient re-educated about  the importance of commitment to a  minimum of 150 minutes of exercise per week as able.  The importance of healthy food choices with portion control discussed, as well as eating regularly and within a 12 hour window most days. The need to choose "clean , green" food 50 to 75% of the time is discussed, as well as to make water the primary drink and set a goal of 64 ounces water daily.       05/18/2021    8:53 AM 04/20/2021   10:10 AM 04/06/2021    8:19 AM  Weight /BMI  Weight 350 lb 351 lb 6.4 oz 356 lb 1.9 oz  Height 5\' 1"  (1.549 m) 5\' 1"  (1.549 m) 5\' 1"  (1.549 m)  BMI 66.13 kg/m2 66.4 kg/m2 67.29 kg/m2      Essential hypertension DASH diet and commitment to daily physical activity for a minimum of 30 minutes discussed and encouraged, as a part of hypertension management. The importance of attaining a healthy weight is also discussed.     08/28/2021   11:45 AM  05/18/2021    8:53 AM 04/20/2021   10:10 AM 04/06/2021    8:45 AM 04/06/2021    8:19 AM 01/19/2021   10:46 AM 01/02/2021    9:27 AM  BP/Weight  Systolic BP 132 126 134 130 139 126 128  Diastolic BP 71 85 85 84 78 74 84  Wt. (Lbs)  350 351.4  356.12 354.6 351.12  BMI  66.13 kg/m2 66.4 kg/m2  67.29 kg/m2 64.86 kg/m2 64.22 kg/m2     Controlled, no change in medication    Follow Up Instructions:    I discussed the assessment and treatment plan with the patient. The patient was provided an opportunity to ask questions and all were answered. The patient agreed with the plan and demonstrated an understanding of the instructions.   The patient was advised to call back or seek an in-person evaluation if the symptoms worsen or if the condition fails to improve as anticipated.12 I provided 12 minutes of non-face-to-face time during this encounter.   [name], MD

## 2021-08-30 NOTE — Progress Notes (Signed)
I connected with  [name] on 08/30/21 by a audio enabled telemedicine application and verified that I am speaking with the correct person using two identifiers.  Patient Location: Home  Provider Location: Office/Clinic  I discussed the limitations of evaluation and management by telemedicine. The patient expressed understanding and agreed to proceed.

## 2021-09-08 NOTE — Progress Notes (Signed)
Office Visit Note  Patient: [name]             Date of Birth: 1960/12/29           MRN: [id number]             PCP: Fayrene Helper, MD Referring: Fayrene Helper, MD Visit Date: 09/21/2021 Occupation: @GUAROCC @  Subjective:  Medication management  History of Present Illness: [name] is a 61 y.o. female with history of seronegative rheumatoid arthritis and osteoarthritis.  She denies any increased joint swelling.  She states she continues to have some discomfort in her joints.  She has been taking Rinvoq 15 mg p.o. daily along with methotrexate 1 mL subcutaneous weekly and folic acid.  She denies any side effects from the medications.  She has been experiencing some weakness in her right lower extremity.  She states she also has disc disease in her lower back.  She has been referred to a neurologist by Dr. Moshe Cipro.  Appointment is pending at this point.  Activities of Daily Living:  Patient reports morning stiffness for 15-20 minutes.   Patient Reports nocturnal pain.  Difficulty dressing/grooming: Denies Difficulty climbing stairs: Reports Difficulty getting out of chair: Reports Difficulty using hands for taps, buttons, cutlery, and/or writing: Reports  Review of Systems  Constitutional:  Negative for fatigue.  HENT:  Negative for mouth sores, mouth dryness and nose dryness.   Eyes:  Negative for pain, itching and dryness.  Respiratory:  Negative for shortness of breath and difficulty breathing.   Cardiovascular:  Negative for chest pain and palpitations.  Gastrointestinal:  Positive for constipation. Negative for blood in stool and diarrhea.  Endocrine: Negative for increased urination.  Genitourinary:  Negative for difficulty urinating.  Musculoskeletal:  Positive for joint pain, joint pain, myalgias, muscle weakness, morning stiffness, muscle tenderness and myalgias. Negative for joint swelling.  Skin:  Negative for color change, rash,  redness and sensitivity to sunlight.  Allergic/Immunologic: Negative for susceptible to infections.  Neurological:  Negative for dizziness, numbness, headaches and weakness.  Hematological:  Positive for bruising/bleeding tendency.  Psychiatric/Behavioral:  Negative for depressed mood and sleep disturbance. The patient is not nervous/anxious.     PMFS History:  Patient Active Problem List   Diagnosis Date Noted   Acute laryngitis 09/10/2021   IGT (impaired glucose tolerance) 05/21/2021   Encounter for Papanicolaou smear of vagina 05/21/2021   Seasonal allergies 04/08/2021   Right leg weakness 01/07/2021   Dyspepsia 01/07/2021   Rheumatoid arthritis with negative rheumatoid factor ([place]) 06/02/2020   COVID 03/21/2020   Cough 03/15/2020   Tendinitis of right ankle 10/12/2019   High risk medication use 01/07/2019   Generalized joint pain 10/20/2018   Recurrent major depressive disorder, in full remission ([place]) 04/10/2018   Allergic dermatitis 02/02/2018   Major depressive disorder, recurrent episode, moderate ([place]) 08/29/2017   Back pain with sciatica 11/13/2015   Knee osteoarthritis 07/13/2013   Chronic diastolic heart failure ([place]) 01/06/2013   Snoring disorder 01/03/2013   Thoracic spine pain 12/29/2012   Vitamin D deficiency 12/08/2012   Exercise intolerance 11/26/2011   Essential hypertension 11/26/2011   Migraine headache    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 Mardelle Matte  Depression 2001   doesn't take any meds   Headache(784.0)    occasionally   Human bite of forearm 11/30/2019   Hypothyroidism    was on Levothyroxine-has been off x 4 months   Joint pain    Joint swelling    Morbid obesity (HCC)    Nocturia    Osteoarthritis of right knee 07/13/2013    Peripheral edema    to right leg;takes Furosemide occasionally and hasnt had one in a month   Syncope    with palpitations; evaluated by [facility] in 2008 with normal echo    Family History  Problem Relation Age of Onset   Diabetes Mother    Depression Mother    Diabetes Father        emphsema    Hyperlipidemia Father    Emphysema Father    Rheum arthritis Sister    Breast cancer Sister    Cancer Sister    Healthy Daughter    Healthy Daughter    Healthy Daughter    Colon cancer Neg Hx    Past Surgical History:  Procedure Laterality Date   ABDOMINAL HYSTERECTOMY  1995   no oophorectomy   CARPAL TUNNEL RELEASE  1991   Bilateral   CESAREAN SECTION     X2   CHOLECYSTECTOMY     CHOLECYSTECTOMY, LAPAROSCOPIC  2007   Dr. Lovell Sheehan   COLONOSCOPY N/A 03/03/2013   Procedure: COLONOSCOPY;  Surgeon: Malissa Hippo, MD;  Location: [facility];  Service: Endoscopy;  Laterality: N/A;  930   CRANIECTOMY SUBOCCIPITAL W/ CERVICAL LAMINECTOMY / CHIARI  2007   Repair of Arnold-Chiari malformation   EYE SURGERY     bilateral cataract surgery   JOINT REPLACEMENT Left 2011   KNEE ARTHROSCOPY  1995   Dr. Cleophas Dunker    THYROIDECTOMY, PARTIAL  2004   Left lobectomy - large benign nodule   TONSILLECTOMY     TOTAL KNEE ARTHROPLASTY  07/2009   Left knee , Dr. Dion Saucier    TOTAL KNEE ARTHROPLASTY Right 07/13/2013   Procedure: TOTAL KNEE ARTHROPLASTY;  Surgeon: Eulas Post, MD;  Location: [facility] OR;  Service: Orthopedics;  Laterality: Right;   Social History   Social History Narrative   Is in a new relationship    Immunization History  Administered Date(s) Administered   Influenza Split 01/16/2012   Influenza Whole 02/23/2007, 12/07/2008   Influenza,inj,Quad PF,6+ Mos 12/07/2012, 02/15/2014, 11/28/2014, 01/09/2016, 04/07/2017, 12/18/2017, 02/16/2019, 12/09/2019, 01/02/2021   Moderna Sars-Covid-2 Vaccination 06/09/2019, 07/07/2019, 03/06/2020, 10/10/2020   Pneumococcal  Polysaccharide-23 07/31/2015   Td 10/23/2005   Tdap 11/30/2019   Zoster Recombinat (Shingrix) 02/02/2021, 04/06/2021     Objective: Vital Signs: BP 125/75 (BP Location: Left Wrist, Patient Position: Sitting, Cuff Size: Normal)   Pulse 69   Ht 5\' 1"  (1.549 m)   Wt (!) 353 lb (160.1 kg)   BMI 66.70 kg/m    Physical Exam Vitals and nursing note reviewed.  Constitutional:      Appearance: She is well-developed.  HENT:     Head: Normocephalic and atraumatic.  Eyes:     Conjunctiva/sclera: Conjunctivae normal.  Cardiovascular:     Rate and Rhythm: Normal rate and regular rhythm.     Heart sounds: Normal heart sounds.  Pulmonary:     Effort: Pulmonary effort is normal.     Breath sounds: Normal breath sounds.  Abdominal:     General: Bowel sounds are normal.     Palpations: Abdomen is soft.  Musculoskeletal:     Cervical back: Normal range of motion.  Lymphadenopathy:     Cervical: No cervical adenopathy.  Skin:    General: Skin is warm and dry.     Capillary Refill: Capillary refill takes less than 2 seconds.  Neurological:     Mental Status: She is alert and oriented to person, place, and time.  Psychiatric:        Behavior: Behavior normal.      Musculoskeletal Exam: C-spine was in good range of motion.  Lumbar spine was difficult to assess.  She had no tenderness on palpation.  Shoulders, elbows, wrists, MCPs PIPs and DIPs with good range of motion with no synovitis.  She had good range of motion of her bilateral hip joints.  She had bilateral knee joint replacement with some discomfort range of motion of her right knee joint.  Ankle joints were in good range of motion.  She had no tenderness over ankles or MTPs.  CDAI Exam: CDAI Score: -- Patient Global: --; Provider Global: -- Swollen: --; Tender: -- Joint Exam 09/21/2021   No joint exam has been documented for this visit   There is currently no information documented on the homunculus. Go to the Rheumatology  activity and complete the homunculus joint exam.  Investigation: No additional findings.  Imaging: No results found.  Recent Labs: Lab Results  Component Value Date   WBC 7.9 07/24/2021   HGB 11.4 07/24/2021   PLT 407 07/24/2021   NA 141 09/19/2021   K 5.0 09/19/2021   CL 101 09/19/2021   CO2 23 09/19/2021   GLUCOSE 97 09/19/2021   BUN 16 09/19/2021   CREATININE 0.87 09/19/2021   BILITOT 0.5 07/24/2021   ALKPHOS 96 07/24/2021   AST 17 07/24/2021   ALT 13 07/24/2021   PROT 7.4 07/24/2021   ALBUMIN 3.9 07/24/2021   CALCIUM 9.5 09/19/2021   GFRAA 85 08/21/2020   QFTBGOLDPLUS NEGATIVE 01/19/2021    Speciality Comments: No specialty comments available.  Procedures:  No procedures performed Allergies: Aleve [naproxen sodium], Penicillins, Adhesive [tape], and Latex   Assessment / Plan:     Visit Diagnoses: Rheumatoid arthritis of multiple sites with negative rheumatoid factor ([place]) -patient denies any flare of rheumatoid arthritis since her last visit.  She has been taking Rinvoq 15 mg 1 tablet by mouth daily, MTX 1.0 ml sq injections once weekly, and folic acid 2 mg po daily without any side effects.  She had no synovitis on examination today.  High risk medication use - Rinvoq 15 mg 1 tablet by mouth daily, MTX 1.0 ml sq injections once weekly, and folic acid 2 mg po daily.Lipid panel was within normal limits on 01/19/2021.  She will get repeat lipid panel with her PCP.  Labs obtained in May were reviewed which were within normal limits.  I advised her to get labs in August and then every 3 months to monitor for drug toxicity.  Information on immunization was placed in the AVS.  She was advised to hold Rinvoq and methotrexate if she develops an infection and resume after the infection resolves.  The blackbox warning on check in the [facility] was reviewed including the risk of DVTs, major cardiovascular events and malignancy.  Primary osteoarthritis of both hands-she has  osteoarthritis in her bilateral hands.  No synovitis was noted.  Status post total bilateral knee replacement-doing well with no discomfort on range of motion.  Primary osteoarthritis of both feet-she had no synovitis on examination.  DDD (degenerative disc disease), cervical-she had good range of motion without discomfort.  DDD (degenerative disc disease), lumbar-she had no point tenderness.  I reviewed her MRI from the past.  She has been experiencing right lower extremity weakness and has an appointment coming up with the neurologist.  Other medical problems are listed as follows:  Essential hypertension  Chronic diastolic heart failure (HCC)  Prediabetes  History of gastroesophageal reflux (GERD)  Hx of migraines  S/P partial thyroidectomy  History of depression  Vitamin D deficiency  Family history of rheumatoid arthritis  Orders: No orders of the defined types were placed in this encounter.  No orders of the defined types were placed in this encounter.    Follow-Up Instructions: Return in about 5 months (around 02/21/2022) for Rheumatoid arthritis.   [name], MD  Note - This record has been created using Animal nutritionist.  Chart creation errors have been sought, but may not always  have been located. Such creation errors do not reflect on  the standard of medical care.

## 2021-09-10 ENCOUNTER — Encounter: Payer: Self-pay | Admitting: Family Medicine

## 2021-09-10 ENCOUNTER — Other Ambulatory Visit: Payer: Self-pay | Admitting: Family Medicine

## 2021-09-10 DIAGNOSIS — J040 Acute laryngitis: Secondary | ICD-10-CM | POA: Insufficient documentation

## 2021-09-10 NOTE — Assessment & Plan Note (Signed)
  Patient re-educated about  the importance of commitment to a  minimum of 150 minutes of exercise per week as able.  The importance of healthy food choices with portion control discussed, as well as eating regularly and within a 12 hour window most days. The need to choose "clean , green" food 50 to 75% of the time is discussed, as well as to make water the primary drink and set a goal of 64 ounces water daily.       05/18/2021    8:53 AM 04/20/2021   10:10 AM 04/06/2021    8:19 AM  Weight /BMI  Weight 350 lb 351 lb 6.4 oz 356 lb 1.9 oz  Height 5\' 1"  (1.549 m) 5\' 1"  (1.549 m) 5\' 1"  (1.549 m)  BMI 66.13 kg/m2 66.4 kg/m2 67.29 kg/m2

## 2021-09-10 NOTE — Assessment & Plan Note (Signed)
Tessalon perles and phenergan dm and  Short course of prednisone

## 2021-09-11 ENCOUNTER — Telehealth: Payer: Self-pay | Admitting: Family Medicine

## 2021-09-11 ENCOUNTER — Other Ambulatory Visit: Payer: Self-pay

## 2021-09-11 MED ORDER — ACYCLOVIR 400 MG PO TABS: 400.0000 mg | ORAL_TABLET | Freq: Three times a day (TID) | ORAL | 3 refills | Status: DC

## 2021-09-19 ENCOUNTER — Ambulatory Visit (INDEPENDENT_AMBULATORY_CARE_PROVIDER_SITE_OTHER): Payer: Medicare HMO | Admitting: Family Medicine

## 2021-09-19 ENCOUNTER — Encounter: Payer: Self-pay | Admitting: Family Medicine

## 2021-09-19 VITALS — BP 145/84 | HR 71 | Ht 61.0 in | Wt 354.1 lb

## 2021-09-19 DIAGNOSIS — M170 Bilateral primary osteoarthritis of knee: Secondary | ICD-10-CM | POA: Diagnosis not present

## 2021-09-19 DIAGNOSIS — M5430 Sciatica, unspecified side: Secondary | ICD-10-CM | POA: Diagnosis not present

## 2021-09-19 DIAGNOSIS — E1159 Type 2 diabetes mellitus with other circulatory complications: Secondary | ICD-10-CM

## 2021-09-19 DIAGNOSIS — E559 Vitamin D deficiency, unspecified: Secondary | ICD-10-CM

## 2021-09-19 DIAGNOSIS — I10 Essential (primary) hypertension: Secondary | ICD-10-CM

## 2021-09-19 DIAGNOSIS — Z1231 Encounter for screening mammogram for malignant neoplasm of breast: Secondary | ICD-10-CM

## 2021-09-19 DIAGNOSIS — R7302 Impaired glucose tolerance (oral): Secondary | ICD-10-CM | POA: Diagnosis not present

## 2021-09-19 DIAGNOSIS — M0600 Rheumatoid arthritis without rheumatoid factor, unspecified site: Secondary | ICD-10-CM | POA: Diagnosis not present

## 2021-09-19 DIAGNOSIS — M549 Dorsalgia, unspecified: Secondary | ICD-10-CM

## 2021-09-19 MED ORDER — PHENTERMINE HCL 37.5 MG PO TABS: 37.5000 mg | ORAL_TABLET | Freq: Every day | ORAL | 1 refills | Status: DC

## 2021-09-19 MED ORDER — TRIAMTERENE-HCTZ 37.5-25 MG PO TABS: 1.0000 | ORAL_TABLET | Freq: Every day | ORAL | 3 refills | Status: DC

## 2021-09-19 NOTE — Patient Instructions (Signed)
  F/u in 8 to 10 weeks , re evaluate weight and blood pressure   Labs today, HBA1C, chem 7 and eGFR, tSH and vit D  I will try for different medication for weight loss once labs are in  Please schedule mammogram at checkout  Stop hCtZ, blood pressure is high, new is triamterene 37.5 one daily  Because of recurrent falls and right leg weakness updated MRI of low back will be obtained and Neurosurgery consult arranged  Try to ' hang in there' even when things just don't seem to be going in the drectio that they should,everyday we have is another opportunity tosee the change we want and are working for!  Thanks for choosing [facility], we consider it a privelige to serve you.

## 2021-09-20 LAB — BMP8+EGFR
BUN/Creatinine Ratio: 18 (ref 9–23)
BUN: 16 mg/dL (ref 6–24)
CO2: 23 mmol/L (ref 20–29)
Calcium: 9.5 mg/dL (ref 8.7–10.2)
Chloride: 101 mmol/L (ref 96–106)
Creatinine, Ser: 0.87 mg/dL (ref 0.57–1.00)
Glucose: 97 mg/dL (ref 70–99)
Potassium: 5 mmol/L (ref 3.5–5.2)
Sodium: 141 mmol/L (ref 134–144)
eGFR: 77 mL/min/{1.73_m2} (ref >59)

## 2021-09-20 LAB — HEMOGLOBIN A1C
Est. average glucose Bld gHb Est-mCnc: 131 mg/dL
Hgb A1c MFr Bld: 6.2 % — ABNORMAL HIGH (ref 4.8–5.6)

## 2021-09-20 LAB — VITAMIN D 25 HYDROXY (VIT D DEFICIENCY, FRACTURES): Vit D, 25-Hydroxy: 45.2 ng/mL (ref 30.0–100.0)

## 2021-09-20 LAB — TSH: TSH: 4.15 u[IU]/mL (ref 0.450–4.500)

## 2021-09-21 ENCOUNTER — Encounter: Payer: Self-pay | Admitting: Rheumatology

## 2021-09-21 ENCOUNTER — Ambulatory Visit (INDEPENDENT_AMBULATORY_CARE_PROVIDER_SITE_OTHER): Payer: Medicare HMO | Admitting: Rheumatology

## 2021-09-21 VITALS — BP 125/75 | HR 69 | Ht 61.0 in | Wt 353.0 lb

## 2021-09-21 DIAGNOSIS — M19071 Primary osteoarthritis, right ankle and foot: Secondary | ICD-10-CM

## 2021-09-21 DIAGNOSIS — M5030 Other cervical disc degeneration, unspecified cervical region: Secondary | ICD-10-CM

## 2021-09-21 DIAGNOSIS — I5032 Chronic diastolic (congestive) heart failure: Secondary | ICD-10-CM | POA: Diagnosis not present

## 2021-09-21 DIAGNOSIS — M0609 Rheumatoid arthritis without rheumatoid factor, multiple sites: Secondary | ICD-10-CM | POA: Diagnosis not present

## 2021-09-21 DIAGNOSIS — Z8659 Personal history of other mental and behavioral disorders: Secondary | ICD-10-CM

## 2021-09-21 DIAGNOSIS — I10 Essential (primary) hypertension: Secondary | ICD-10-CM

## 2021-09-21 DIAGNOSIS — Z79899 Other long term (current) drug therapy: Secondary | ICD-10-CM | POA: Diagnosis not present

## 2021-09-21 DIAGNOSIS — Z8719 Personal history of other diseases of the digestive system: Secondary | ICD-10-CM

## 2021-09-21 DIAGNOSIS — Z8261 Family history of arthritis: Secondary | ICD-10-CM

## 2021-09-21 DIAGNOSIS — Z8669 Personal history of other diseases of the nervous system and sense organs: Secondary | ICD-10-CM

## 2021-09-21 DIAGNOSIS — M19041 Primary osteoarthritis, right hand: Secondary | ICD-10-CM

## 2021-09-21 DIAGNOSIS — M5136 Other intervertebral disc degeneration, lumbar region: Secondary | ICD-10-CM | POA: Diagnosis not present

## 2021-09-21 DIAGNOSIS — R7303 Prediabetes: Secondary | ICD-10-CM

## 2021-09-21 DIAGNOSIS — M19042 Primary osteoarthritis, left hand: Secondary | ICD-10-CM

## 2021-09-21 DIAGNOSIS — E890 Postprocedural hypothyroidism: Secondary | ICD-10-CM

## 2021-09-21 DIAGNOSIS — M51369 Other intervertebral disc degeneration, lumbar region without mention of lumbar back pain or lower extremity pain: Secondary | ICD-10-CM

## 2021-09-21 DIAGNOSIS — Z96653 Presence of artificial knee joint, bilateral: Secondary | ICD-10-CM | POA: Diagnosis not present

## 2021-09-21 DIAGNOSIS — E559 Vitamin D deficiency, unspecified: Secondary | ICD-10-CM

## 2021-09-21 DIAGNOSIS — M19072 Primary osteoarthritis, left ankle and foot: Secondary | ICD-10-CM

## 2021-09-21 NOTE — Patient Instructions (Signed)
Standing Labs We placed an order today for your standing lab work.   Please have your standing labs drawn in August  and every 3 months  If possible, please have your labs drawn 2 weeks prior to your appointment so that the provider can discuss your results at your appointment.  Please note that you may see your imaging and lab results in MyChart before we have reviewed them. We may be awaiting multiple results to interpret others before contacting you. Please allow our office up to 72 hours to thoroughly review all of the results before contacting the office for clarification of your results.  We have open lab daily: Monday through Thursday from 1:30-4:30 PM and Friday from 1:30-4:00 PM at the office of Dr. Avantae Bither, [facility].   Please be advised, all patients with office appointments requiring lab work will take precedent over walk-in lab work.  If possible, please come for your lab work on Monday and Friday afternoons, as you may experience shorter wait times. The office is located at [address], [address], [place], [place] [address] No appointment is necessary.   Labs are drawn by [facility]. Please bring your co-pay at the time of your lab draw.  You may receive a bill from [facility] for your lab work.  Please note if you are on Hydroxychloroquine and and an order has been placed for a Hydroxychloroquine level, you will need to have it drawn 4 hours or more after your last dose.  If you wish to have your labs drawn at another location, please call the office 24 hours in advance to send orders.  If you have any questions regarding directions or hours of operation,  please call [phone].   As a reminder, please drink plenty of water prior to coming for your lab work. Thanks!   Vaccines You are taking a medication(s) that can suppress your immune system.  The following immunizations are recommended: Flu annually Covid-19  Td/Tdap (tetanus, diphtheria,  pertussis) every 10 years Pneumonia (Prevnar 15 then Pneumovax 23 at least 1 year apart.  Alternatively, can take Prevnar 20 without needing additional dose) Shingrix: 2 doses from 4 weeks to 6 months apart  Please check with your PCP to make sure you are up to date.   If you have signs or symptoms of an infection or start antibiotics: First, call your PCP for workup of your infection. Hold your medication through the infection, until you complete your antibiotics, and until symptoms resolve if you take the following: Injectable medication (Actemra, Benlysta, Cimzia, Cosentyx, Enbrel, Humira, Kevzara, Orencia, Remicade, Simponi, Stelara, Taltz, Tremfya) Methotrexate Leflunomide (Arava) Mycophenolate (Cellcept) Xeljanz, Olumiant, or Rinvoq  

## 2021-09-27 ENCOUNTER — Telehealth: Payer: Self-pay

## 2021-09-27 NOTE — Telephone Encounter (Signed)
Patient called she states the triamterene is making her sick she's nauseous dizzy wants to know if she should take half of the tablet or what we would recommend ph# [phone]

## 2021-09-28 ENCOUNTER — Encounter: Payer: Self-pay | Admitting: Family Medicine

## 2021-09-28 NOTE — Telephone Encounter (Signed)
She will take 1/2 tab and see if she is able to tolerate it and call back next week and let us know if she is not able to take it at all or if 1/2 tab is working to see if anything else needs to be added.

## 2021-10-01 ENCOUNTER — Ambulatory Visit (INDEPENDENT_AMBULATORY_CARE_PROVIDER_SITE_OTHER): Payer: Medicare HMO | Admitting: Internal Medicine

## 2021-10-01 ENCOUNTER — Encounter: Payer: Self-pay | Admitting: Family Medicine

## 2021-10-01 ENCOUNTER — Encounter: Payer: Self-pay | Admitting: Internal Medicine

## 2021-10-01 ENCOUNTER — Other Ambulatory Visit: Payer: Self-pay | Admitting: Family Medicine

## 2021-10-01 VITALS — BP 122/80 | HR 62 | Ht 61.0 in | Wt 337.4 lb

## 2021-10-01 DIAGNOSIS — E1159 Type 2 diabetes mellitus with other circulatory complications: Secondary | ICD-10-CM | POA: Insufficient documentation

## 2021-10-01 DIAGNOSIS — K529 Noninfective gastroenteritis and colitis, unspecified: Secondary | ICD-10-CM | POA: Diagnosis not present

## 2021-10-01 DIAGNOSIS — I10 Essential (primary) hypertension: Secondary | ICD-10-CM

## 2021-10-01 MED ORDER — ONDANSETRON HCL 4 MG PO TABS: 4.0000 mg | ORAL_TABLET | Freq: Three times a day (TID) | ORAL | 0 refills | Status: DC | PRN

## 2021-10-01 MED ORDER — TRULICITY 0.75 MG/0.5ML ~~LOC~~ SOAJ: 0.7500 mg | SUBCUTANEOUS | 1 refills | Status: DC

## 2021-10-01 MED ORDER — AZITHROMYCIN 500 MG PO TABS: 500.0000 mg | ORAL_TABLET | Freq: Every day | ORAL | 0 refills | Status: DC

## 2021-10-01 NOTE — Progress Notes (Signed)
Acute Office Visit  Subjective:    Patient ID: [name], female    DOB: [birth year], 61 y.o.   MRN: [id number]  Chief Complaint  Patient presents with   Dizziness    Nausea, diarrhea for about a week.      HPI Patient is in today for c/o diarrhea - watery and loose BM on an intermittent basis, nausea and dizziness X 1 week.  She denies any fever or chills.  Denies any melena or hematochezia.  She does report nausea and episodes of nonbloody, nonbilious vomiting.  She is able to tolerate p.o. intake for now.  She was having low BP readings at home, and started taking half tablet of Maxzide.  Her BP was well controlled today.  Past Medical History:  Diagnosis Date   Allergy    Anxiety    Sometimes   Chronic back pain    Degenerative joint disease    Left TKA in 07/2009-Dr. Landau   Depression 2001   doesn't take any meds   Headache(784.0)    occasionally   Human bite of forearm 11/30/2019   Hypothyroidism    was on Levothyroxine-has been off x 4 months   Joint pain    Joint swelling    Morbid obesity (HCC)    Nocturia    Osteoarthritis of right knee 07/13/2013   Peripheral edema    to right leg;takes Furosemide occasionally and hasnt had one in a month   Syncope    with palpitations; evaluated by [facility] in 2008 with normal echo   Type 2 diabetes mellitus with vascular disease (HCC) 10/01/2021    Past Surgical History:  Procedure Laterality Date   ABDOMINAL HYSTERECTOMY  1995   no oophorectomy   CARPAL TUNNEL RELEASE  1991   Bilateral   CESAREAN SECTION     X2   CHOLECYSTECTOMY     CHOLECYSTECTOMY, LAPAROSCOPIC  2007   Dr. Lovell Sheehan   COLONOSCOPY N/A 03/03/2013   Procedure: COLONOSCOPY;  Surgeon: Malissa Hippo, MD;  Location: [facility];  Service: Endoscopy;  Laterality: N/A;  930   CRANIECTOMY SUBOCCIPITAL W/ CERVICAL LAMINECTOMY / CHIARI  2007   Repair of Arnold-Chiari malformation   EYE SURGERY     bilateral cataract  surgery   JOINT REPLACEMENT Left 2011   KNEE ARTHROSCOPY  1995   Dr. Cleophas Dunker    THYROIDECTOMY, PARTIAL  2004   Left lobectomy - large benign nodule   TONSILLECTOMY     TOTAL KNEE ARTHROPLASTY  07/2009   Left knee , Dr. Dion Saucier    TOTAL KNEE ARTHROPLASTY Right 07/13/2013   Procedure: TOTAL KNEE ARTHROPLASTY;  Surgeon: Eulas Post, MD;  Location: [facility] OR;  Service: Orthopedics;  Laterality: Right;    Family History  Problem Relation Age of Onset   Diabetes Mother    Depression Mother    Diabetes Father        emphsema    Hyperlipidemia Father    Emphysema Father    Rheum arthritis Sister    Breast cancer Sister    Cancer Sister    Healthy Daughter    Healthy Daughter    Healthy Daughter    Colon cancer Neg Hx     Social History   Socioeconomic History   Marital status: Significant Other    Spouse name: Not on file   Number of children: 3   Years of education: 15   Highest education level: 12th grade  Occupational History  Occupation: Employed seeking disabilty 2012  Tobacco Use   Smoking status: Never    Passive exposure: Never   Smokeless tobacco: Never  Vaping Use   Vaping Use: Never used  Substance and Sexual Activity   Alcohol use: Yes    Comment: occ   Drug use: No   Sexual activity: Yes    Birth control/protection: Condom, Surgical  Other Topics Concern   Not on file  Social History Narrative   Is in a new relationship    Social Determinants of Health   Financial Resource Strain: Low Risk  (08/02/2020)   Overall Financial Resource Strain (CARDIA)    Difficulty of Paying Living Expenses: Not hard at all  Food Insecurity: No Food Insecurity (08/27/2021)   Hunger Vital Sign    Worried About Running Out of Food in the Last Year: Never true    Ran Out of Food in the Last Year: Never true  Transportation Needs: No Transportation Needs (08/27/2021)   PRAPARE - Hydrologist (Medical): No    Lack of Transportation  (Non-Medical): No  Physical Activity: Inactive (08/27/2021)   Exercise Vital Sign    Days of Exercise per Week: 0 days    Minutes of Exercise per Session: 0 min  Stress: No Stress Concern Present (08/02/2020)   [place]    Feeling of Stress : Not at all  Social Connections: Moderately Integrated (08/02/2020)   Social Connection and Isolation Panel [NHANES]    Frequency of Communication with Friends and Family: More than three times a week    Frequency of Social Gatherings with Friends and Family: More than three times a week    Attends Religious Services: More than 4 times per year    Active Member of [employer] or Organizations: Yes    Attends Archivist Meetings: More than 4 times per year    Marital Status: Never married  Intimate Partner Violence: Not At Risk (08/02/2020)   Humiliation, Afraid, Rape, and Kick questionnaire    Fear of Current or Ex-Partner: No    Emotionally Abused: No    Physically Abused: No    Sexually Abused: No    Outpatient Medications Prior to Visit  Medication Sig Dispense Refill   acyclovir (ZOVIRAX) 400 MG tablet Take 1 tablet (400 mg total) by mouth 3 (three) times daily. (Patient taking differently: Take 400 mg by mouth as needed.) 30 tablet 3   benzonatate (TESSALON) 100 MG capsule Take 1 capsule (100 mg total) by mouth 2 (two) times daily as needed for cough. 20 capsule 0   folic acid (FOLVITE) 1 MG tablet TAKE 2 TABLETS BY MOUTH EVERY DAY 180 tablet 3   metFORMIN (GLUCOPHAGE) 500 MG tablet TAKE 1 TABLET BY MOUTH 2 TIMES DAILY WITH A MEAL. 180 tablet 1   Methotrexate Sodium (METHOTREXATE, PF,) 50 MG/2ML injection Inject 1 ML (25 mg total) into the skin once week. 12 mL 0   montelukast (SINGULAIR) 10 MG tablet TAKE 1 TABLET BY MOUTH EVERYDAY AT BEDTIME 90 tablet 1   ondansetron (ZOFRAN) 4 MG tablet Take 1 tablet (4 mg total) by mouth every 8 (eight) hours as needed for nausea or  vomiting. 20 tablet 0   phentermine (ADIPEX-P) 37.5 MG tablet Take 1 tablet (37.5 mg total) by mouth daily before breakfast. 30 tablet 1   promethazine-dextromethorphan (PROMETHAZINE-DM) 6.25-15 MG/5ML syrup Take one teaspoon at bedtime as needed, for excessive cough 180 mL  0   triamterene-hydrochlorothiazide (MAXZIDE-25) 37.5-25 MG tablet Take 1 tablet by mouth daily. 30 tablet 3   Tuberculin-Allergy Syringes 27G X 1/2" 1 ML MISC Use 1 syringe once weekly to inject methotrexate. 12 each 3   Upadacitinib ER (RINVOQ) 15 MG TB24 Take 15 mg by mouth daily. 90 tablet 0   VITAMIN D PO Take by mouth daily.     No facility-administered medications prior to visit.    Allergies  Allergen Reactions   Aleve [Naproxen Sodium] Shortness Of Breath   Penicillins Swelling    .Has patient had a PCN reaction causing immediate rash, facial/tongue/throat swelling, SOB or lightheadedness with hypotension: Yes Has patient had a PCN reaction causing severe rash involving mucus membranes or skin necrosis: No Has patient had a PCN reaction that required hospitalization: No Has patient had a PCN reaction occurring within the last 10 years: No If all of the above answers are "NO", then may proceed with Cephalosporin use.    Adhesive [Tape]     rash   Latex Rash    Review of Systems  Constitutional:  Positive for fatigue. Negative for chills and fever.  Respiratory:  Negative for cough and shortness of breath.   Cardiovascular:  Negative for chest pain and palpitations.  Gastrointestinal:  Positive for diarrhea, nausea and vomiting. Negative for blood in stool.  Genitourinary:  Negative for dysuria and hematuria.  Musculoskeletal:  Negative for neck pain and neck stiffness.  Skin:  Negative for rash.  Neurological:  Positive for dizziness and weakness.  Psychiatric/Behavioral:  Negative for agitation and behavioral problems.        Objective:    Physical Exam Vitals reviewed.  Constitutional:       General: She is not in acute distress.    Appearance: She is obese. She is not diaphoretic.  HENT:     Head: Normocephalic and atraumatic.     Nose: Nose normal.     Mouth/Throat:     Mouth: Mucous membranes are dry.  Eyes:     General: No scleral icterus.    Extraocular Movements: Extraocular movements intact.  Cardiovascular:     Rate and Rhythm: Normal rate and regular rhythm.     Pulses: Normal pulses.     Heart sounds: Normal heart sounds. No murmur heard. Pulmonary:     Breath sounds: Normal breath sounds. No wheezing or rales.  Abdominal:     Palpations: Abdomen is soft.     Tenderness: There is abdominal tenderness (Mild, generalized). There is no guarding or rebound.  Musculoskeletal:     Cervical back: Neck supple. No tenderness.  Skin:    General: Skin is warm.     Findings: No rash.  Neurological:     General: No focal deficit present.     Mental Status: She is alert and oriented to person, place, and time.  Psychiatric:        Mood and Affect: Mood normal.        Behavior: Behavior normal.     BP 122/80   Pulse 62   Ht 5\' 1"  (1.549 m)   Wt (!) 337 lb 6.4 oz (153 kg)   SpO2 99%   BMI 63.75 kg/m  Wt Readings from Last 3 Encounters:  10/01/21 (!) 337 lb 6.4 oz (153 kg)  09/21/21 (!) 353 lb (160.1 kg)  09/19/21 (!) 354 lb 1.9 oz (160.6 kg)        Assessment & Plan:   Problem List Items Addressed This  Visit    Visit Diagnoses     Acute gastroenteritis    -  Primary Considering her persistent symptoms, started empiric Azithromycin Advised to maintain adequate hydration Zofran as needed for nausea If unable to tolerate p.o., go to ER   Relevant Medications   azithromycin (ZITHROMAX) 500 MG tablet       Essential hypertension Well controlled today Advised to continue only half tablet of Maxzide for now   Meds ordered this encounter  Medications   azithromycin (ZITHROMAX) 500 MG tablet    Sig: Take 1 tablet (500 mg total) by mouth daily.     Dispense:  3 tablet    Refill:  0     Raiana Pharris Keith Rake, MD

## 2021-10-01 NOTE — Assessment & Plan Note (Signed)
  Patient re-educated about  the importance of commitment to a  minimum of 150 minutes of exercise per week as able.  The importance of healthy food choices with portion control discussed, as well as eating regularly and within a 12 hour window most days. The need to choose "clean , green" food 50 to 75% of the time is discussed, as well as to make water the primary drink and set a goal of 64 ounces water daily.       09/21/2021   10:32 AM 09/19/2021   10:16 AM 05/18/2021    8:53 AM  Weight /BMI  Weight 353 lb 354 lb 1.9 oz 350 lb  Height 5\' 1"  (1.549 m) 5\' 1"  (1.549 m) 5\' 1"  (1.549 m)  BMI 66.7 kg/m2 66.91 kg/m2 66.13 kg/m2    New for diabetes is trulicity

## 2021-10-01 NOTE — Assessment & Plan Note (Signed)
Uncontrolled start maxzide DASH diet and commitment to daily physical activity for a minimum of 30 minutes discussed and encouraged, as a part of hypertension management. The importance of attaining a healthy weight is also discussed.     09/21/2021   10:32 AM 09/19/2021   10:16 AM 08/28/2021   11:45 AM 05/18/2021    8:53 AM 04/20/2021   10:10 AM 04/06/2021    8:45 AM 04/06/2021    8:19 AM  BP/Weight  Systolic BP 125 145 132 126 134 130 139  Diastolic BP 75 84 71 85 85 84 78  Wt. (Lbs) 353 354.12  350 351.4  356.12  BMI 66.7 kg/m2 66.91 kg/m2  66.13 kg/m2 66.4 kg/m2  67.29 kg/m2

## 2021-10-01 NOTE — Patient Instructions (Signed)
Please take Azithromycin as needed.  Please take Zofran as needed for nausea/vomiting.  Please maintain adequate hydration by taking at least 64 ounces of fluid in a day.

## 2021-10-01 NOTE — Assessment & Plan Note (Signed)
Pain and stiffness limit mobility

## 2021-10-01 NOTE — Assessment & Plan Note (Addendum)
[name] is reminded of the importance of commitment to daily physical activity for 30 minutes or more, as able and the need to limit carbohydrate intake to 30 to 60 grams per meal to help with blood sugar control.   The need to take medication as prescribed, test blood sugar as directed, and to call between visits if there is a concern that blood sugar is uncontrolled is also discussed.   [name] is reminded of the importance of daily foot exam, annual eye examination, and good blood sugar, blood pressure and cholesterol control. Add trulicity weekly     Latest Ref Rng & Units 09/19/2021   11:11 AM 07/24/2021   11:20 AM 05/18/2021   10:09 AM 04/20/2021   10:31 AM 01/19/2021   11:12 AM  Diabetic Labs  HbA1c 4.8 - 5.6 % 6.2   6.2     Chol <200 mg/dL     979   HDL > OR = 50 mg/dL     51   Calc LDL mg/dL (calc)     70   Triglycerides <150 mg/dL     150   Creatinine 4.13 - 1.00 mg/dL 6.43  8.37   7.93  9.68       09/21/2021   10:32 AM 09/19/2021   10:16 AM 08/28/2021   11:45 AM 05/18/2021    8:53 AM 04/20/2021   10:10 AM 04/06/2021    8:45 AM 04/06/2021    8:19 AM  BP/Weight  Systolic BP 125 145 132 126 134 130 139  Diastolic BP 75 84 71 85 85 84 78  Wt. (Lbs) 353 354.12  350 351.4  356.12  BMI 66.7 kg/m2 66.91 kg/m2  66.13 kg/m2 66.4 kg/m2  67.29 kg/m2      10/12/2019    2:20 PM 07/31/2015    8:45 AM  Foot/eye exam completion dates  Foot Form Completion Done Done      Cntinue metformin snd add ozempic based on morbid obesity with inability to lose weight

## 2021-10-01 NOTE — Assessment & Plan Note (Signed)
Managed by Rheumatology, c/o chronic joint pain

## 2021-10-01 NOTE — Assessment & Plan Note (Signed)
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

## 2021-10-01 NOTE — Progress Notes (Signed)
[name]     MRN: [id number]      DOB: 06-09-1960   HPI [name] is here for follow up and re-evaluation of chronic medical conditions, medication management and review of any available recent lab and radiology data.  Preventive health is updated, specifically  Cancer screening and Immunization.   Depressed and tearful due to lack of weight loss despite very reduced food intake C/o increased right lower extremity  weakness and recurrent falls ROS Denies recent fever or chills. Denies sinus pressure, nasal congestion, ear pain or sore throat. Denies chest congestion, productive cough or wheezing. Denies chest pains, palpitations and leg swelling Denies abdominal pain, nausea, vomiting,diarrhea or constipation.   Denies dysuria, frequency, hesitancy or incontinence. C/o  joint pain, swelling and limitation in mobility. Denies headaches, seizures, numbness, or tingling. C/o  depression, frustrated with weight and it's negative effect on mobility and overall health. Denies skin break down or rash.   PE  BP (!) 145/84   Pulse 71   Ht 5\' 1"  (1.549 m)   Wt (!) 354 lb 1.9 oz (160.6 kg)   SpO2 94%   BMI 66.91 kg/m   Patient alert and oriented and in no cardiopulmonary distress.  HEENT: No facial asymmetry, EOMI,     Neck supple .  Chest: Clear to auscultation bilaterally.  CVS: S1, S2 no murmurs, no S3.Regular rate.  ABD: Soft non tender.   Ext: No edema  MS: decreased  ROM spine, shoulders, hips and knees.  Skin: Intact, no ulcerations or rash noted.  Psych: Good eye contact, normal affect. Memory intact tearful,  anxious and  depressed appearing.  CNS: CN 2-12 intact, grade 4 power in RLE , grade 5 in other extremities, Assessment & Plan  Type 2 diabetes mellitus with vascular disease (HCC) [name] is reminded of the importance of commitment to daily physical activity for 30 minutes or more, as able and the need to limit carbohydrate intake to  30 to 60 grams per meal to help with blood sugar control.   The need to take medication as prescribed, test blood sugar as directed, and to call between visits if there is a concern that blood sugar is uncontrolled is also discussed.   [name] is reminded of the importance of daily foot exam, annual eye examination, and good blood sugar, blood pressure and cholesterol control. Add trulicity weekly     Latest Ref Rng & Units 09/19/2021   11:11 AM 07/24/2021   11:20 AM 05/18/2021   10:09 AM 04/20/2021   10:31 AM 01/19/2021   11:12 AM  Diabetic Labs  HbA1c 4.8 - 5.6 % 6.2   6.2     Chol <200 mg/dL     13/06/2020   HDL > OR = 50 mg/dL     51   Calc LDL mg/dL (calc)     70   Triglycerides <150 mg/dL     149   Creatinine 702 - 1.00 mg/dL 6.37  8.58   8.50  2.77       09/21/2021   10:32 AM 09/19/2021   10:16 AM 08/28/2021   11:45 AM 05/18/2021    8:53 AM 04/20/2021   10:10 AM 04/06/2021    8:45 AM 04/06/2021    8:19 AM  BP/Weight  Systolic BP 125 145 132 126 134 130 139  Diastolic BP 75 84 71 85 85 84 78  Wt. (Lbs) 353 354.12  350 351.4  356.12  BMI 66.7 kg/m2  66.91 kg/m2  66.13 kg/m2 66.4 kg/m2  67.29 kg/m2      10/12/2019    2:20 PM 07/31/2015    8:45 AM  Foot/eye exam completion dates  Foot Form Completion Done Done      Cntinue metformin snd add ozempic based on morbid obesity with inability to lose weight  Essential hypertension Uncontrolled start maxzide DASH diet and commitment to daily physical activity for a minimum of 30 minutes discussed and encouraged, as a part of hypertension management. The importance of attaining a healthy weight is also discussed.     09/21/2021   10:32 AM 09/19/2021   10:16 AM 08/28/2021   11:45 AM 05/18/2021    8:53 AM 04/20/2021   10:10 AM 04/06/2021    8:45 AM 04/06/2021    8:19 AM  BP/Weight  Systolic BP 125 145 132 126 134 130 139  Diastolic BP 75 84 71 85 85 84 78  Wt. (Lbs) 353 354.12  350 351.4  356.12  BMI 66.7 kg/m2 66.91 kg/m2  66.13 kg/m2  66.4 kg/m2  67.29 kg/m2       Morbid obesity  Patient re-educated about  the importance of commitment to a  minimum of 150 minutes of exercise per week as able.  The importance of healthy food choices with portion control discussed, as well as eating regularly and within a 12 hour window most days. The need to choose "clean , green" food 50 to 75% of the time is discussed, as well as to make water the primary drink and set a goal of 64 ounces water daily.       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

## 2021-10-17 ENCOUNTER — Other Ambulatory Visit: Payer: Self-pay | Admitting: *Deleted

## 2021-10-17 DIAGNOSIS — M0609 Rheumatoid arthritis without rheumatoid factor, multiple sites: Secondary | ICD-10-CM

## 2021-10-17 MED ORDER — RINVOQ 15 MG PO TB24: 15.0000 mg | ORAL_TABLET | Freq: Every day | ORAL | 0 refills | Status: DC

## 2021-10-17 NOTE — Telephone Encounter (Signed)
Refill request received via fax from [facility] for Rinvoq  Next Visit: 02/22/2022  Last Visit: 09/21/2021  Last Fill: 07/18/2021  DX: Rheumatoid arthritis of multiple sites with negative rheumatoid factor   Current Dose per office note 09/21/2021: Rinvoq 15 mg 1 tablet by mouth daily  Labs: 07/24/2021 CBC and CMP are stable.  TB Gold: 01/19/2021 Neg    Left message to advise patient she is due to update labs   Okay to refill Rinvoq?

## 2021-10-18 ENCOUNTER — Ambulatory Visit (HOSPITAL_COMMUNITY)
Admission: RE | Admit: 2021-10-18 | Discharge: 2021-10-18 | Disposition: A | Discharge status: Home / Self Care | Payer: Medicare HMO | Source: Ambulatory Visit | Attending: Family Medicine | Admitting: Family Medicine

## 2021-10-18 DIAGNOSIS — H52229 Regular astigmatism, unspecified eye: Secondary | ICD-10-CM | POA: Diagnosis not present

## 2021-10-18 DIAGNOSIS — Z1231 Encounter for screening mammogram for malignant neoplasm of breast: Secondary | ICD-10-CM | POA: Insufficient documentation

## 2021-10-19 ENCOUNTER — Other Ambulatory Visit: Payer: Self-pay

## 2021-10-19 DIAGNOSIS — Z79899 Other long term (current) drug therapy: Secondary | ICD-10-CM | POA: Diagnosis not present

## 2021-10-20 LAB — CBC WITH DIFFERENTIAL/PLATELET
Absolute Monocytes: 462 cells/uL (ref 200–950)
Basophils Absolute: 43 cells/uL (ref 0–200)
Basophils Relative: 0.6 %
Eosinophils Absolute: 78 cells/uL (ref 15–500)
Eosinophils Relative: 1.1 %
HCT: 38.3 % (ref 35.0–45.0)
Hemoglobin: 11.9 g/dL (ref 11.7–15.5)
Lymphs Abs: 2840 cells/uL (ref 850–3900)
MCH: 24.1 pg — ABNORMAL LOW (ref 27.0–33.0)
MCHC: 31.1 g/dL — ABNORMAL LOW (ref 32.0–36.0)
MCV: 77.7 fL — ABNORMAL LOW (ref 80.0–100.0)
MPV: 10 fL (ref 7.5–12.5)
Monocytes Relative: 6.5 %
Neutro Abs: 3678 cells/uL (ref 1500–7800)
Neutrophils Relative %: 51.8 %
Platelets: 371 10*3/uL (ref 140–400)
RBC: 4.93 10*6/uL (ref 3.80–5.10)
RDW: 14.4 % (ref 11.0–15.0)
Total Lymphocyte: 40 %
WBC: 7.1 10*3/uL (ref 3.8–10.8)

## 2021-10-20 LAB — COMPLETE METABOLIC PANEL WITH GFR
AG Ratio: 0.9 (calc) — ABNORMAL LOW (ref 1.0–2.5)
ALT: 10 U/L (ref 6–29)
AST: 14 U/L (ref 10–35)
Albumin: 4.1 g/dL (ref 3.6–5.1)
Alkaline phosphatase (APISO): 88 U/L (ref 37–153)
BUN/Creatinine Ratio: 19 (calc) (ref 6–22)
BUN: 20 mg/dL (ref 7–25)
CO2: 27 mmol/L (ref 20–32)
Calcium: 9.9 mg/dL (ref 8.6–10.4)
Chloride: 100 mmol/L (ref 98–110)
Creat: 1.04 mg/dL — ABNORMAL HIGH (ref 0.50–1.03)
Globulin: 4.4 g/dL (calc) — ABNORMAL HIGH (ref 1.9–3.7)
Glucose, Bld: 94 mg/dL (ref 65–139)
Potassium: 4.3 mmol/L (ref 3.5–5.3)
Sodium: 137 mmol/L (ref 135–146)
Total Bilirubin: 0.5 mg/dL (ref 0.2–1.2)
Total Protein: 8.5 g/dL — ABNORMAL HIGH (ref 6.1–8.1)
eGFR: 62 mL/min/{1.73_m2} (ref >=60)

## 2021-10-20 NOTE — Progress Notes (Signed)
Creatinine is mildly elevated.  Patient was clinically doing well at the last visit.  Please advise her to reduce the dose of methotrexate to 0.6 mL subcu weekly.  CBC is stable.

## 2021-10-22 ENCOUNTER — Other Ambulatory Visit: Payer: Self-pay | Admitting: *Deleted

## 2021-10-22 MED ORDER — METHOTREXATE SODIUM CHEMO INJECTION 50 MG/2ML: 15.0000 mg | INTRAMUSCULAR | 0 refills | Status: DC

## 2021-10-22 NOTE — Telephone Encounter (Signed)
-----   Message from [name], MD sent at 10/20/2021  1:23 PM EDT ----- Creatinine is mildly elevated.  Patient was clinically doing well at the last visit.  Please advise her to reduce the dose of methotrexate to 0.6 mL subcu weekly.  CBC is stable.

## 2021-10-29 ENCOUNTER — Ambulatory Visit (HOSPITAL_COMMUNITY): Payer: Medicare HMO

## 2021-11-05 ENCOUNTER — Ambulatory Visit (HOSPITAL_COMMUNITY)
Admission: RE | Admit: 2021-11-05 | Discharge: 2021-11-05 | Disposition: A | Discharge status: Home / Self Care | Payer: Medicare HMO | Source: Ambulatory Visit | Attending: Family Medicine | Admitting: Family Medicine

## 2021-11-05 DIAGNOSIS — E559 Vitamin D deficiency, unspecified: Secondary | ICD-10-CM | POA: Diagnosis not present

## 2021-11-05 DIAGNOSIS — M549 Dorsalgia, unspecified: Secondary | ICD-10-CM | POA: Diagnosis not present

## 2021-11-05 DIAGNOSIS — M5430 Sciatica, unspecified side: Secondary | ICD-10-CM | POA: Diagnosis not present

## 2021-11-05 DIAGNOSIS — M5126 Other intervertebral disc displacement, lumbar region: Secondary | ICD-10-CM | POA: Diagnosis not present

## 2021-11-05 DIAGNOSIS — M47817 Spondylosis without myelopathy or radiculopathy, lumbosacral region: Secondary | ICD-10-CM | POA: Diagnosis not present

## 2021-11-05 DIAGNOSIS — Z6841 Body Mass Index (BMI) 40.0 and over, adult: Secondary | ICD-10-CM | POA: Diagnosis not present

## 2021-11-05 DIAGNOSIS — M5136 Other intervertebral disc degeneration, lumbar region: Secondary | ICD-10-CM | POA: Diagnosis not present

## 2021-11-16 ENCOUNTER — Encounter: Payer: Self-pay | Admitting: Family Medicine

## 2021-11-16 ENCOUNTER — Ambulatory Visit (INDEPENDENT_AMBULATORY_CARE_PROVIDER_SITE_OTHER): Payer: Medicare HMO | Admitting: Family Medicine

## 2021-11-16 VITALS — BP 130/84 | HR 67 | Ht 61.0 in | Wt 340.0 lb

## 2021-11-16 DIAGNOSIS — I10 Essential (primary) hypertension: Secondary | ICD-10-CM | POA: Diagnosis not present

## 2021-11-16 DIAGNOSIS — Z23 Encounter for immunization: Secondary | ICD-10-CM

## 2021-11-16 DIAGNOSIS — M0600 Rheumatoid arthritis without rheumatoid factor, unspecified site: Secondary | ICD-10-CM

## 2021-11-16 DIAGNOSIS — Z6841 Body Mass Index (BMI) 40.0 and over, adult: Secondary | ICD-10-CM | POA: Diagnosis not present

## 2021-11-16 NOTE — Patient Instructions (Addendum)
Annual exam in end October, call if you need me sooner  Excellent blood pressure  All the best with weight loss  Fasting lipid, chem 7 and EGFr 3 days before next visit  Flu vaccine today  It is important that you exercise regularly at least 30 minutes 5 times a week. If you develop chest pain, have severe difficulty breathing, or feel very tired, stop exercising immediately and seek medical attention    Think about what you will eat, plan ahead. Choose " clean, green, fresh or frozen" over canned, processed or packaged foods which are more sugary, salty and fatty. 70 to 75% of food eaten should be vegetables and fruit. Three meals at set times with snacks allowed between meals, but they must be fruit or vegetables. Aim to eat over a 12 hour period , example 7 am to 7 pm, and STOP after  your last meal of the day. Drink water,generally about 64 ounces per day, no other drink is as healthy. Fruit juice is best enjoyed in a healthy way, by EATING the fruit. Thanks for choosing [facility], we consider it a privelige to serve you.

## 2021-11-19 ENCOUNTER — Encounter: Payer: Self-pay | Admitting: Family Medicine

## 2021-11-19 NOTE — Assessment & Plan Note (Signed)
Controlled, no change in medication DASH diet and commitment to daily physical activity for a minimum of 30 minutes discussed and encouraged, as a part of hypertension management. The importance of attaining a healthy weight is also discussed.     11/16/2021    9:21 AM 11/16/2021    8:56 AM 11/16/2021    8:49 AM 10/01/2021    2:11 PM 09/21/2021   10:32 AM 09/19/2021   10:16 AM 08/28/2021   11:45 AM  BP/Weight  Systolic BP 130 113 125 122 125 145 132  Diastolic BP 84 74 57 80 75 84 71  Wt. (Lbs)   340 337.4 353 354.12   BMI   64.24 kg/m2 63.75 kg/m2 66.7 kg/m2 66.91 kg/m2

## 2021-11-19 NOTE — Assessment & Plan Note (Signed)
  Patient re-educated about  the importance of commitment to a  minimum of 150 minutes of exercise per week as able.  The importance of healthy food choices with portion control discussed, as well as eating regularly and within a 12 hour window most days. The need to choose "clean , green" food 50 to 75% of the time is discussed, as well as to make water the primary drink and set a goal of 64 ounces water daily.       11/16/2021    8:49 AM 10/01/2021    2:11 PM 09/21/2021   10:32 AM  Weight /BMI  Weight 340 lb 337 lb 6.4 oz 353 lb  Height 5\' 1"  (1.549 m) 5\' 1"  (1.549 m) 5\' 1"  (1.549 m)  BMI 64.24 kg/m2 63.75 kg/m2 66.7 kg/m2

## 2021-11-19 NOTE — Assessment & Plan Note (Signed)
Managed by rheumatology , has ahd recent hallenges getting medication prescribed

## 2021-11-19 NOTE — Progress Notes (Signed)
[name]     MRN: [id number]      DOB: January 13, 1961   HPI [name] is here for follow up and re-evaluation of chronic medical conditions, medication management and review of any available recent lab and radiology data.  Preventive health is updated, specifically  Cancer screening and Immunization.   Questions or concerns regarding consultations or procedures which the PT has had in the interim are  addressed.currently enrolled in weight management clinic and able to get prescription med wegovy through the clinic The PT denies any adverse reactions to current medications since the last visit.  There are no new concerns.  There are no specific complaints   ROS Denies recent fever or chills. Denies sinus pressure, nasal congestion, ear pain or sore throat. Denies chest congestion, productive cough or wheezing. Denies chest pains, palpitations and leg swelling Denies abdominal pain, nausea, vomiting,diarrhea or constipation.   Denies dysuria, frequency, hesitancy or incontinence. C/o joint pain, swelling and limitation in mobility. Denies headaches, seizures, numbness, or tingling. Denies depression, anxiety or insomnia. Denies skin break down or rash.   PE  BP 130/84   Pulse 67   Ht 5\' 1"  (1.549 m)   Wt (!) 340 lb (154.2 kg)   SpO2 100%   BMI 64.24 kg/m   Patient alert and oriented and in no cardiopulmonary distress.  HEENT: No facial asymmetry, EOMI,     Neck supple .  Chest: Clear to auscultation bilaterally.  CVS: S1, S2 no murmurs, no S3.Regular rate.  ABD: Soft non tender.   Ext: No edema  MS: Adequate though reduced  ROM spine, shoulders, hips and knees.  Skin: Intact, no ulcerations or rash noted.  Psych: Good eye contact, normal affect. Memory intact not anxious or depressed appearing.  CNS: CN 2-12 intact, power,  normal throughout.no focal deficits noted.   Assessment & Plan  Essential hypertension Controlled, no change in  medication DASH diet and commitment to daily physical activity for a minimum of 30 minutes discussed and encouraged, as a part of hypertension management. The importance of attaining a healthy weight is also discussed.     11/16/2021    9:21 AM 11/16/2021    8:56 AM 11/16/2021    8:49 AM 10/01/2021    2:11 PM 09/21/2021   10:32 AM 09/19/2021   10:16 AM 08/28/2021   11:45 AM  BP/Weight  Systolic BP 130 113 125 122 125 145 132  Diastolic BP 84 74 57 80 75 84 71  Wt. (Lbs)   340 337.4 353 354.12   BMI   64.24 kg/m2 63.75 kg/m2 66.7 kg/m2 66.91 kg/m2        Morbid obesity  Patient re-educated about  the importance of commitment to a  minimum of 150 minutes of exercise per week as able.  The importance of healthy food choices with portion control discussed, as well as eating regularly and within a 12 hour window most days. The need to choose "clean , green" food 50 to 75% of the time is discussed, as well as to make water the primary drink and set a goal of 64 ounces water daily.       11/16/2021    8:49 AM 10/01/2021    2:11 PM 09/21/2021   10:32 AM  Weight /BMI  Weight 340 lb 337 lb 6.4 oz 353 lb  Height 5\' 1"  (1.549 m) 5\' 1"  (1.549 m) 5\' 1"  (1.549 m)  BMI 64.24 kg/m2 63.75 kg/m2 66.7 kg/m2  Rheumatoid arthritis with negative rheumatoid factor (HCC) Managed by rheumatology , has ahd recent hallenges getting medication prescribed

## 2022-01-04 ENCOUNTER — Other Ambulatory Visit: Payer: Self-pay | Admitting: Physician Assistant

## 2022-01-04 ENCOUNTER — Ambulatory Visit (INDEPENDENT_AMBULATORY_CARE_PROVIDER_SITE_OTHER): Payer: Medicare HMO | Admitting: Family Medicine

## 2022-01-04 ENCOUNTER — Encounter: Payer: Self-pay | Admitting: Family Medicine

## 2022-01-04 VITALS — BP 113/74 | HR 66 | Ht 61.0 in | Wt 330.1 lb

## 2022-01-04 DIAGNOSIS — E559 Vitamin D deficiency, unspecified: Secondary | ICD-10-CM | POA: Diagnosis not present

## 2022-01-04 DIAGNOSIS — Z0001 Encounter for general adult medical examination with abnormal findings: Secondary | ICD-10-CM | POA: Diagnosis not present

## 2022-01-04 NOTE — Assessment & Plan Note (Signed)
Annual exam as documented. . Immunization and cancer screening needs are specifically addressed at this visit.  

## 2022-01-04 NOTE — Telephone Encounter (Signed)
Next Visit: 02/22/2022   Last Visit: 09/21/2021   Last Fill: 03/29/2020   DX: Rheumatoid arthritis of multiple sites with negative rheumatoid factor    Current Dose per office note 09/21/2021: methotrexate to 0.6 mL subcu weekly  Labs: 8/4/2023Creatinine is mildly elevated.  Patient was clinically doing well at the last visit.  Please advise her to reduce the dose of methotrexate to 0.6 mL subcu weekly.  CBC is stable.  Okay to refill MTX?

## 2022-01-04 NOTE — Patient Instructions (Signed)
Follow-up in 4 months call if you need me sooner.  Congrats on excellent weight loss 2 pounds per week keep this up.  You are up-to-date in all your routine health maintenance except that you need the most recent COVID vaccine.  Please get this at your pharmacy.  Continue current medications.  It is important that you exercise regularly at least 30 minutes 5 times a week. If you develop chest pain, have severe difficulty breathing, or feel very tired, stop exercising immediately and seek medical attention   Think about what you will eat, plan ahead. Choose " clean, green, fresh or frozen" over canned, processed or packaged foods which are more sugary, salty and fatty. 70 to 75% of food eaten should be vegetables and fruit. Three meals at set times with snacks allowed between meals, but they must be fruit or vegetables. Aim to eat over a 12 hour period , example 7 am to 7 pm, and STOP after  your last meal of the day. Drink water,generally about 64 ounces per day, no other drink is as healthy. Fruit juice is best enjoyed in a healthy way, by EATING the fruit. Thanks for choosing [facility], we consider it a privelige to serve you.

## 2022-01-04 NOTE — Progress Notes (Signed)
    [name]     MRN: [id number]      DOB: [birth year]  HPI: Patient is in for annual physical exam. Immunization is reviewed , and  updated if needed.   PE: BP 113/74 (BP Location: Right Arm, Patient Position: Sitting, Cuff Size: Large)   Pulse 66   Ht 5\' 1"  (1.549 m)   Wt (!) 330 lb 1.9 oz (149.7 kg)   SpO2 95%   BMI 62.38 kg/m   Pleasant  female, alert and oriented x 3, in no cardio-pulmonary distress. Afebrile. HEENT No facial trauma or asymetry. Sinuses non tender.  Extra occullar muscles intact.. External ears normal, . Neck: supple, no adenopathy,JVD or thyromegaly.No bruits.  Chest: Clear to ascultation bilaterally.No crackles or wheezes. Non tender to palpation    Cardiovascular system; Heart sounds normal,  S1 and  S2 ,no S3.  No murmur, or thrill. Apical beat not displaced Peripheral pulses normal.  Abdomen: Soft, non tender, no organomegaly or masses. No bruits. Bowel sounds normal. No guarding, tenderness or rebound.    Musculoskeletal exam: Decreased ROM of spine, hips , shoulders and knees.  deformity ,swelling and crepitus noted. No muscle wasting or atrophy.   Neurologic: Cranial nerves 2 to 12 intact. Power, tone ,sensation and reflexes normal throughout. disturbance in gait. No tremor.  Skin: Intact, no ulceration, erythema , scaling or rash noted. Pigmentation normal throughout  Psych; Normal mood and affect. Judgement and concentration normal   Assessment & Plan:  Annual visit for general adult medical examination with abnormal findings Annual exam as documented. . Immunization and cancer screening needs are specifically addressed at this visit.

## 2022-01-14 ENCOUNTER — Other Ambulatory Visit: Payer: Self-pay | Admitting: Family Medicine

## 2022-01-14 ENCOUNTER — Telehealth: Payer: Self-pay

## 2022-01-14 ENCOUNTER — Other Ambulatory Visit: Payer: Self-pay

## 2022-01-14 MED ORDER — FAMOTIDINE 40 MG PO TABS: 40.0000 mg | ORAL_TABLET | Freq: Every day | ORAL | 3 refills | Status: DC

## 2022-01-14 NOTE — Progress Notes (Signed)
Pepcid 40

## 2022-01-14 NOTE — Telephone Encounter (Signed)
Patient called can Dr Moshe Cipro send in her acid reflux medicine hurting bad today. Does not know the name of it.

## 2022-01-15 ENCOUNTER — Ambulatory Visit (INDEPENDENT_AMBULATORY_CARE_PROVIDER_SITE_OTHER): Payer: Medicare HMO | Admitting: Family Medicine

## 2022-01-15 ENCOUNTER — Other Ambulatory Visit: Payer: Self-pay

## 2022-01-15 ENCOUNTER — Encounter: Payer: Self-pay | Admitting: Family Medicine

## 2022-01-15 VITALS — BP 123/70 | HR 68 | Ht 61.0 in | Wt 322.0 lb

## 2022-01-15 DIAGNOSIS — E538 Deficiency of other specified B group vitamins: Secondary | ICD-10-CM

## 2022-01-15 DIAGNOSIS — I10 Essential (primary) hypertension: Secondary | ICD-10-CM | POA: Diagnosis not present

## 2022-01-15 DIAGNOSIS — R69 Illness, unspecified: Secondary | ICD-10-CM | POA: Diagnosis not present

## 2022-01-15 DIAGNOSIS — Z6379 Other stressful life events affecting family and household: Secondary | ICD-10-CM

## 2022-01-15 DIAGNOSIS — K219 Gastro-esophageal reflux disease without esophagitis: Secondary | ICD-10-CM | POA: Diagnosis not present

## 2022-01-15 NOTE — Progress Notes (Signed)
   [name]     MRN: [id number]      DOB: [birth year]   HPI [name] is here fc/o light headedness x 3 days For past 1 week, after chewing she has difficulty swallowing and is spitting out food, has lost 10 pounds in 11 days, c/o food sticking mid chest with heartburn x 3 days 2 week h/o severe stress due to acute complicated illness  of  her brother ROS Denies recent fever or chills. Denies sinus pressure, nasal congestion, ear pain or sore throat. Denies chest congestion, productive cough or wheezing. Denies , palpitations and leg swelling .   Denies dysuria, frequency, hesitancy or incontinence. Chronic  joint pain, swelling and limitation in mobility. Denies headaches, seizures, numbness, or tingling. C/o  depression, anxiety  Denies skin break down or rash.   PE  BP 123/70 (BP Location: Right Arm, Patient Position: Sitting, Cuff Size: Large)   Pulse 68   Ht 5\' 1"  (1.549 m)   Wt (!) 322 lb 0.6 oz (146.1 kg)   SpO2 98%   BMI 60.85 kg/m   Patient alert and oriented and in no cardiopulmonary distress.Anxious and depressed appearing  HEENT: No facial asymmetry, EOMI,     Neck supple .  Chest: Clear to auscultation bilaterally.  CVS: S1, S2 no murmurs, no S3.Regular rate.  ABD: Soft mild epigastric tenderness  Ext: No edema  MS: Decreased  ROM spine, shoulders, hips and knees.  Skin: Intact, no ulcerations or rash noted.  Psych: Good eye contact, . Memory intact both  anxious or depressed appearing.  CNS: CN 2-12 intact, power,  normal throughout.no focal deficits noted.   Assessment & Plan  GERD (gastroesophageal reflux disease) Symptomatic with burning and dysphagia,also excessive weight loss in short time,  start pepcid and GI eval asap  Essential hypertension Controlled, no change in medication DASH diet and commitment to daily physical activity for a minimum of 30 minutes discussed and encouraged, as a part of hypertension management. The  importance of attaining a healthy weight is also discussed.     01/15/2022   10:45 AM 01/04/2022    4:08 PM 11/16/2021    9:21 AM 11/16/2021    8:56 AM 11/16/2021    8:49 AM 10/01/2021    2:11 PM 09/21/2021   10:32 AM  BP/Weight  Systolic BP 338 250 539 767 341 937 902  Diastolic BP 70 74 84 74 57 80 75  Wt. (Lbs) 322.04 330.12   340 337.4 353  BMI 60.85 kg/m2 62.38 kg/m2   64.24 kg/m2 63.75 kg/m2 66.7 kg/m2       Morbid obesity Excessive rapid weight loss due to acute illness, cutioned re danger associated woith this , and advised of the need to eat on a regular schedule small amounts as able  Stress due to illness of family member Encouraged and allowed to emote during visit , and advised her to get counseling to assist with coping as the stress she is feeling is affecting her physical health, even her abiliy to eat food

## 2022-01-15 NOTE — Patient Instructions (Signed)
F/U in mid December, call if you need me sooner  Pepcid is prescribed and you are referred to GI  Important that you eat regularly, not eating enough makes you  light headed  B12, cmp and eGFr labs today  Work on stress, try to deal with the difficult times in a way that will not affect your physical health  Thanks for choosing [facility], we consider it a privelige to serve you.

## 2022-01-16 ENCOUNTER — Other Ambulatory Visit: Payer: Self-pay | Admitting: Family Medicine

## 2022-01-16 ENCOUNTER — Encounter (INDEPENDENT_AMBULATORY_CARE_PROVIDER_SITE_OTHER): Payer: Self-pay | Admitting: *Deleted

## 2022-01-16 DIAGNOSIS — I10 Essential (primary) hypertension: Secondary | ICD-10-CM

## 2022-01-16 LAB — CMP14+EGFR
ALT: 11 IU/L (ref 0–32)
AST: 15 IU/L (ref 0–40)
Albumin/Globulin Ratio: 1.1 — ABNORMAL LOW (ref 1.2–2.2)
Albumin: 4.4 g/dL (ref 3.8–4.9)
Alkaline Phosphatase: 91 IU/L (ref 44–121)
BUN/Creatinine Ratio: 15 (ref 9–23)
BUN: 21 mg/dL (ref 6–24)
Bilirubin Total: 0.7 mg/dL (ref 0.0–1.2)
CO2: 24 mmol/L (ref 20–29)
Calcium: 10.5 mg/dL — ABNORMAL HIGH (ref 8.7–10.2)
Chloride: 98 mmol/L (ref 96–106)
Creatinine, Ser: 1.37 mg/dL — ABNORMAL HIGH (ref 0.57–1.00)
Globulin, Total: 4 g/dL (ref 1.5–4.5)
Glucose: 96 mg/dL (ref 70–99)
Potassium: 4.4 mmol/L (ref 3.5–5.2)
Sodium: 137 mmol/L (ref 134–144)
Total Protein: 8.4 g/dL (ref 6.0–8.5)
eGFR: 44 mL/min/{1.73_m2} — ABNORMAL LOW (ref >59)

## 2022-01-16 LAB — VITAMIN B12: Vitamin B-12: >2000 pg/mL — ABNORMAL HIGH (ref 232–1245)

## 2022-01-18 DIAGNOSIS — I10 Essential (primary) hypertension: Secondary | ICD-10-CM | POA: Diagnosis not present

## 2022-01-20 DIAGNOSIS — Z6379 Other stressful life events affecting family and household: Secondary | ICD-10-CM | POA: Insufficient documentation

## 2022-01-20 NOTE — Assessment & Plan Note (Signed)
Controlled, no change in medication DASH diet and commitment to daily physical activity for a minimum of 30 minutes discussed and encouraged, as a part of hypertension management. The importance of attaining a healthy weight is also discussed.     01/15/2022   10:45 AM 01/04/2022    4:08 PM 11/16/2021    9:21 AM 11/16/2021    8:56 AM 11/16/2021    8:49 AM 10/01/2021    2:11 PM 09/21/2021   10:32 AM  BP/Weight  Systolic BP 215 872 761 848 592 763 943  Diastolic BP 70 74 84 74 57 80 75  Wt. (Lbs) 322.04 330.12   340 337.4 353  BMI 60.85 kg/m2 62.38 kg/m2   64.24 kg/m2 63.75 kg/m2 66.7 kg/m2

## 2022-01-20 NOTE — Assessment & Plan Note (Signed)
Excessive rapid weight loss due to acute illness, cutioned re danger associated woith this , and advised of the need to eat on a regular schedule small amounts as able

## 2022-01-20 NOTE — Assessment & Plan Note (Signed)
Encouraged and allowed to emote during visit , and advised her to get counseling to assist with coping as the stress she is feeling is affecting her physical health, even her abiliy to eat food

## 2022-01-20 NOTE — Assessment & Plan Note (Signed)
Symptomatic with burning and dysphagia,also excessive weight loss in short time,  start pepcid and GI eval asap

## 2022-02-11 NOTE — Progress Notes (Deleted)
Office Visit Note  Patient: [name]             Date of Birth: Jan 17, 1961           MRN: [id number]             PCP: Fayrene Helper, MD Referring: Fayrene Helper, MD Visit Date: 02/22/2022 Occupation: @GUAROCC @  Subjective:  No chief complaint on file.   History of Present Illness: [name] is a 61 y.o. female ***   Activities of Daily Living:  Patient reports morning stiffness for *** {minute/hour:19697}.   Patient {ACTIONS;DENIES/REPORTS:21021675::"Denies"} nocturnal pain.  Difficulty dressing/grooming: {ACTIONS;DENIES/REPORTS:21021675::"Denies"} Difficulty climbing stairs: {ACTIONS;DENIES/REPORTS:21021675::"Denies"} Difficulty getting out of chair: {ACTIONS;DENIES/REPORTS:21021675::"Denies"} Difficulty using hands for taps, buttons, cutlery, and/or writing: {ACTIONS;DENIES/REPORTS:21021675::"Denies"}  No Rheumatology ROS completed.   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 ([place]) 06/02/2020   Tendinitis of right ankle 10/12/2019   High risk medication use 01/07/2019   Generalized joint pain 10/20/2018   Major depressive disorder, recurrent episode, moderate ([place]) 08/29/2017   Back pain with sciatica 11/13/2015   Knee osteoarthritis 07/13/2013   Snoring disorder 01/03/2013   Thoracic spine pain 12/29/2012   Vitamin D deficiency 12/08/2012   GERD (gastroesophageal reflux disease) 07/15/2012   Exercise intolerance 11/26/2011   Essential hypertension 11/26/2011   Migraine headache    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 Landau   Depression 2001   doesn't take any meds   Headache(784.0)    occasionally   Human bite of forearm 11/30/2019   Hypothyroidism    was on Levothyroxine-has been off x 4 months   Joint pain    Joint swelling    Morbid obesity ([place])    Nocturia    Osteoarthritis of right knee 07/13/2013   Peripheral edema    to right leg;takes Furosemide occasionally and hasnt had one in a month   Syncope    with palpitations; evaluated by [facility] in 2008 with normal echo   Type 2 diabetes mellitus with vascular disease ([place]) 10/01/2021    Family History  Problem Relation Age of Onset   Diabetes Mother    Depression Mother    Diabetes Father        emphsema    Hyperlipidemia Father    Emphysema Father    Rheum arthritis Sister    Breast cancer Sister    Cancer Sister    Healthy Daughter    Healthy Daughter    Healthy Daughter    Colon cancer Neg Hx    Past Surgical History:  Procedure Laterality Date   ABDOMINAL HYSTERECTOMY  1995   no oophorectomy   CARPAL TUNNEL RELEASE  1991   Bilateral   CESAREAN SECTION     X2   CHOLECYSTECTOMY     CHOLECYSTECTOMY, LAPAROSCOPIC  2007   Dr. Arnoldo Morale   COLONOSCOPY N/A 03/03/2013   Procedure: COLONOSCOPY;  Surgeon: Rogene Houston, MD;  Location: [facility];  Service: Endoscopy;  Laterality: N/A;  930   CRANIECTOMY SUBOCCIPITAL W/ CERVICAL LAMINECTOMY / CHIARI  2007   Repair of Arnold-Chiari malformation   EYE SURGERY     bilateral cataract surgery   JOINT REPLACEMENT Left 2011   KNEE ARTHROSCOPY  1995   Dr. Cleophas Dunker    THYROIDECTOMY, PARTIAL  2004   Left lobectomy - large benign nodule   TONSILLECTOMY     TOTAL KNEE ARTHROPLASTY  07/2009   Left knee , Dr. Dion Saucier    TOTAL KNEE ARTHROPLASTY Right 07/13/2013   Procedure: TOTAL KNEE ARTHROPLASTY;  Surgeon: Eulas Post, MD;  Location: [facility] OR;  Service: Orthopedics;  Laterality: Right;   Social  History   Social History Narrative   Is in a new relationship    Immunization History  Administered Date(s) Administered   Influenza Split 01/16/2012   Influenza Whole 02/23/2007, 12/07/2008   Influenza,inj,Quad PF,6+ Mos 12/07/2012, 02/15/2014, 11/28/2014, 01/09/2016, 04/07/2017, 12/18/2017, 02/16/2019, 12/09/2019, 01/02/2021, 11/16/2021   Moderna Covid-19 Vaccine Bivalent Booster 66yrs & up 10/23/2021   Moderna Sars-Covid-2 Vaccination 06/09/2019, 07/07/2019, 03/06/2020, 10/10/2020   Pneumococcal Polysaccharide-23 07/31/2015   Td 10/23/2005   Tdap 11/30/2019   Zoster Recombinat (Shingrix) 02/02/2021, 04/06/2021     Objective: Vital Signs: There were no vitals taken for this visit.   Physical Exam   Musculoskeletal Exam: ***  CDAI Exam: CDAI Score: -- Patient Global: --; Provider Global: -- Swollen: --; Tender: -- Joint Exam 02/22/2022   No joint exam has been documented for this visit   There is currently no information documented on the homunculus. Go to the Rheumatology activity and complete the homunculus joint exam.  Investigation: No additional findings.  Imaging: No results found.  Recent Labs: Lab Results  Component Value Date   WBC 7.1 10/19/2021   HGB 11.9 10/19/2021   PLT 371 10/19/2021   NA 137 01/15/2022   K 4.4 01/15/2022   CL 98 01/15/2022   CO2 24 01/15/2022   GLUCOSE 96 01/15/2022   BUN 21 01/15/2022   CREATININE 1.37 (H) 01/15/2022   BILITOT 0.7 01/15/2022   ALKPHOS 91 01/15/2022   AST 15 01/15/2022   ALT 11 01/15/2022   PROT 8.4 01/15/2022   ALBUMIN 4.4 01/15/2022   CALCIUM 10.5 (H) 01/15/2022   GFRAA 85 08/21/2020   QFTBGOLDPLUS NEGATIVE 01/19/2021    Speciality Comments: No specialty comments available.  Procedures:  No procedures performed Allergies: Aleve [naproxen sodium], Penicillins, Adhesive [tape], and Latex   Assessment / Plan:     Visit Diagnoses: No diagnosis found.  Orders: No orders of the defined types were  placed in this encounter.  No orders of the defined types were placed in this encounter.   Face-to-face time spent with patient was *** minutes. Greater than 50% of time was spent in counseling and coordination of care.  Follow-Up Instructions: No follow-ups on file.   Ellen Henri, CMA  Note - This record has been created using Animal nutritionist.  Chart creation errors have been sought, but may not always  have been located. Such creation errors do not reflect on  the standard of medical care.

## 2022-02-15 DIAGNOSIS — E559 Vitamin D deficiency, unspecified: Secondary | ICD-10-CM | POA: Diagnosis not present

## 2022-02-22 ENCOUNTER — Ambulatory Visit: Payer: Medicare HMO | Admitting: Physician Assistant

## 2022-02-22 DIAGNOSIS — R7303 Prediabetes: Secondary | ICD-10-CM

## 2022-02-22 DIAGNOSIS — Z96653 Presence of artificial knee joint, bilateral: Secondary | ICD-10-CM

## 2022-02-22 DIAGNOSIS — Z8659 Personal history of other mental and behavioral disorders: Secondary | ICD-10-CM

## 2022-02-22 DIAGNOSIS — M0609 Rheumatoid arthritis without rheumatoid factor, multiple sites: Secondary | ICD-10-CM

## 2022-02-22 DIAGNOSIS — Z79899 Other long term (current) drug therapy: Secondary | ICD-10-CM

## 2022-02-22 DIAGNOSIS — E559 Vitamin D deficiency, unspecified: Secondary | ICD-10-CM

## 2022-02-22 DIAGNOSIS — Z8669 Personal history of other diseases of the nervous system and sense organs: Secondary | ICD-10-CM

## 2022-02-22 DIAGNOSIS — M19071 Primary osteoarthritis, right ankle and foot: Secondary | ICD-10-CM

## 2022-02-22 DIAGNOSIS — I10 Essential (primary) hypertension: Secondary | ICD-10-CM

## 2022-02-22 DIAGNOSIS — M5136 Other intervertebral disc degeneration, lumbar region: Secondary | ICD-10-CM

## 2022-02-22 DIAGNOSIS — M5030 Other cervical disc degeneration, unspecified cervical region: Secondary | ICD-10-CM

## 2022-02-22 DIAGNOSIS — M19041 Primary osteoarthritis, right hand: Secondary | ICD-10-CM

## 2022-02-22 DIAGNOSIS — Z8261 Family history of arthritis: Secondary | ICD-10-CM

## 2022-02-22 DIAGNOSIS — Z8719 Personal history of other diseases of the digestive system: Secondary | ICD-10-CM

## 2022-02-22 DIAGNOSIS — E890 Postprocedural hypothyroidism: Secondary | ICD-10-CM

## 2022-02-22 DIAGNOSIS — I5032 Chronic diastolic (congestive) heart failure: Secondary | ICD-10-CM

## 2022-02-26 ENCOUNTER — Telehealth: Payer: Self-pay | Admitting: Pharmacist

## 2022-02-26 NOTE — Telephone Encounter (Signed)
Received notification from [employer] portal. Submitted a Prior Authorization RENEWAL request to [facility] for RINVOQ via CoverMyMeds. Will update once we receive a response.  Key: [id number]  Per automated response: The patient currently has access to the requested medication and a Prior Authorization is not needed for the patient/medication.  Chesley Mires, PharmD, MPH, BCPS, CPP Clinical Pharmacist (Rheumatology and Pulmonology)

## 2022-02-27 ENCOUNTER — Other Ambulatory Visit: Payer: Self-pay | Admitting: Physician Assistant

## 2022-02-27 ENCOUNTER — Encounter: Payer: Self-pay | Admitting: *Deleted

## 2022-02-27 NOTE — Telephone Encounter (Signed)
Next Visit: 1//07/2022   Last Visit: 09/21/2021   Last Fill: 10/22/2021   DX: Rheumatoid arthritis of multiple sites with negative rheumatoid factor    Current Dose per office note 09/21/2021: methotrexate to 0.6 mL subcu weekly   Labs: 8/4/2023Creatinine is mildly elevated.  Patient was clinically doing well at the last visit.  Please advise her to reduce the dose of methotrexate to 0.6 mL subcu weekly.  CBC is stable.  01/15/2022 CMP Creat. 1.37, GFR 44, Calcium 10.5, Albumin/Globulin Ratio 1.1  Message sent to patient via my chart to advise she is due to update labs   Okay to refill MTX?

## 2022-02-28 ENCOUNTER — Ambulatory Visit (INDEPENDENT_AMBULATORY_CARE_PROVIDER_SITE_OTHER): Payer: Medicare HMO | Admitting: Family Medicine

## 2022-02-28 ENCOUNTER — Encounter: Payer: Self-pay | Admitting: Family Medicine

## 2022-02-28 VITALS — BP 130/88 | HR 76 | Ht 61.0 in | Wt 329.1 lb

## 2022-02-28 DIAGNOSIS — Z6379 Other stressful life events affecting family and household: Secondary | ICD-10-CM | POA: Diagnosis not present

## 2022-02-28 DIAGNOSIS — I10 Essential (primary) hypertension: Secondary | ICD-10-CM

## 2022-02-28 DIAGNOSIS — M0600 Rheumatoid arthritis without rheumatoid factor, unspecified site: Secondary | ICD-10-CM | POA: Diagnosis not present

## 2022-02-28 DIAGNOSIS — J302 Other seasonal allergic rhinitis: Secondary | ICD-10-CM

## 2022-02-28 DIAGNOSIS — R69 Illness, unspecified: Secondary | ICD-10-CM | POA: Diagnosis not present

## 2022-02-28 MED ORDER — PHENTERMINE HCL 37.5 MG PO TABS: 37.5000 mg | ORAL_TABLET | Freq: Every day | ORAL | 1 refills | Status: DC

## 2022-02-28 NOTE — Patient Instructions (Addendum)
F/U in Feb as before, call if you need me sooner  Phentermine one daily is prescribed, pl discuss with weight loss center once you get re started on injectable  Need covid  vaccine if none since October  Best for the season and 2024.  Very important that you take BP meds every day  Thanks for choosing [facility], we consider it a privelige to serve you.

## 2022-03-03 ENCOUNTER — Encounter: Payer: Self-pay | Admitting: Family Medicine

## 2022-03-03 NOTE — Assessment & Plan Note (Signed)
Controlled, no change in medication  

## 2022-03-03 NOTE — Assessment & Plan Note (Signed)
Ongoing illness in sibling,managing this better , but still has inxcreased anxiety and feels stressed

## 2022-03-03 NOTE — Assessment & Plan Note (Addendum)
  Patient re-educated about  the importance of commitment to a  minimum of 150 minutes of exercise per week as able. Rx sent for 8 weeks phentermine with f/u in 7 weeks  The importance of healthy food choices with portion control discussed, as well as eating regularly and within a 12 hour window most days. The need to choose "clean , green" food 50 to 75% of the time is discussed, as well as to make water the primary drink and set a goal of 64 ounces water daily.       02/28/2022    9:57 AM 01/15/2022   10:45 AM 01/04/2022    4:08 PM  Weight /BMI  Weight 329 lb 1.9 oz 322 lb 0.6 oz 330 lb 1.9 oz  Height 5\' 1"  (1.549 m) 5\' 1"  (1.549 m) 5\' 1"  (1.549 m)  BMI 62.19 kg/m2 60.85 kg/m2 62.38 kg/m2

## 2022-03-03 NOTE — Assessment & Plan Note (Signed)
Treated by Rheumatology

## 2022-03-03 NOTE — Assessment & Plan Note (Addendum)
Sub optimally Controlled, no change in medication, needs to take daily, educated about this DASH diet and commitment to daily physical activity for a minimum of 30 minutes discussed and encouraged, as a part of hypertension management. The importance of attaining a healthy weight is also discussed.     02/28/2022   10:31 AM 02/28/2022    9:57 AM 01/15/2022   10:45 AM 01/04/2022    4:08 PM 11/16/2021    9:21 AM 11/16/2021    8:56 AM 11/16/2021    8:49 AM  BP/Weight  Systolic BP 130 138 123 113 130 113 125  Diastolic BP 88 84 70 74 84 74 57  Wt. (Lbs)  329.12 322.04 330.12   340  BMI  62.19 kg/m2 60.85 kg/m2 62.38 kg/m2   64.24 kg/m2

## 2022-03-03 NOTE — Progress Notes (Signed)
[name]     MRN: [id number]      DOB: [birth year]   HPI [name] is here for follow up and re-evaluation of chronic medical conditions, medication management and review of any available recent lab and radiology data.  Preventive health is updated, specifically  Cancer screening and Immunization.   Requests phenermine rx until she is able to resume injectable med through weight loss clinic , awaiting new ins coverage to verify what she has access to. Take BP meds intermittently, states needs to avpoid bathroom use at some of her jobs, notes intermittent leg swelling which is directly relate to med compliance ROS Denies recent fever or chills. Denies sinus pressure, nasal congestion, ear pain or sore throat. Denies chest congestion, productive cough or wheezing. Denies chest pains, palpitations PND or orthopnea Denies abdominal pain, nausea, vomiting,diarrhea or constipation.   Denies dysuria, frequency, hesitancy or incontinence. Chronic  joint pain, swelling and limitation in mobility. Denies headaches, seizures, numbness, or tingling. Denies  uncontrolled depression, anxiety or insomnia. Denies skin break down or rash.   PE  BP 130/88   Pulse 76   Ht 5\' 1"  (1.549 m)   Wt (!) 329 lb 1.9 oz (149.3 kg)   SpO2 96%   BMI 62.19 kg/m   Patient alert and oriented and in no cardiopulmonary distress.  HEENT: No facial asymmetry, EOMI,     Neck supple .  Chest: Clear to auscultation bilaterally.  CVS: S1, S2 no murmurs, no S3.Regular rate.  ABD: Soft non tender.   Ext: trace  edema  MS: decreased  ROM spine, shoulders, hips and knees.  Skin: Intact, no ulcerations or rash noted.  Psych: Good eye contact, normal affect. Memory intact mildly  anxious and  depressed appearing.  CNS: CN 2-12 intact, power,  normal throughout.no focal deficits noted.   Assessment & Plan  Essential hypertension Sub optimally Controlled, no change in medication, needs to take  daily, educated about this DASH diet and commitment to daily physical activity for a minimum of 30 minutes discussed and encouraged, as a part of hypertension management. The importance of attaining a healthy weight is also discussed.     02/28/2022   10:31 AM 02/28/2022    9:57 AM 01/15/2022   10:45 AM 01/04/2022    4:08 PM 11/16/2021    9:21 AM 11/16/2021    8:56 AM 11/16/2021    8:49 AM  BP/Weight  Systolic BP 130 138 123 113 130 113 125  Diastolic BP 88 84 70 74 84 74 57  Wt. (Lbs)  329.12 322.04 330.12   340  BMI  62.19 kg/m2 60.85 kg/m2 62.38 kg/m2   64.24 kg/m2       Morbid obesity  Patient re-educated about  the importance of commitment to a  minimum of 150 minutes of exercise per week as able. Rx sent for 8 weeks phentermine with f/u in 7 weeks  The importance of healthy food choices with portion control discussed, as well as eating regularly and within a 12 hour window most days. The need to choose "clean , green" food 50 to 75% of the time is discussed, as well as to make water the primary drink and set a goal of 64 ounces water daily.       02/28/2022    9:57 AM 01/15/2022   10:45 AM 01/04/2022    4:08 PM  Weight /BMI  Weight 329 lb 1.9 oz 322 lb 0.6 oz 330 lb 1.9 oz  Height 5\' 1"  (1.549 m) 5\' 1"  (1.549 m) 5\' 1"  (1.549 m)  BMI 62.19 kg/m2 60.85 kg/m2 62.38 kg/m2      Rheumatoid arthritis with negative rheumatoid factor (HCC) Treated by Rheumatology  Stress due to illness of family member Ongoing illness in sibling,managing this better , but still has inxcreased anxiety and feels stressed  Seasonal allergies Controlled, no change in medication

## 2022-03-08 ENCOUNTER — Other Ambulatory Visit: Payer: Self-pay | Admitting: *Deleted

## 2022-03-08 DIAGNOSIS — Z79899 Other long term (current) drug therapy: Secondary | ICD-10-CM

## 2022-03-08 DIAGNOSIS — Z9225 Personal history of immunosupression therapy: Secondary | ICD-10-CM

## 2022-03-08 DIAGNOSIS — Z111 Encounter for screening for respiratory tuberculosis: Secondary | ICD-10-CM

## 2022-03-08 NOTE — Progress Notes (Signed)
Office Visit Note  Patient: [name]             Date of Birth: March 15, 1961           MRN: [id number]             PCP: Kerri Perches, MD Referring: Kerri Perches, MD Visit Date: 03/22/2022 Occupation: @GUAROCC @  Subjective:  Right 3rd finger pain   History of Present Illness: [name] is a 61 y.o. female with history of seronegative rheumatoid arthritis, osteoarthritis, and DDD. Patient is currently taking Rinvoq 15 mg 1 tablet by mouth daily, MTX 1.0 ml sq injections once weekly, and folic acid 2 mg po daily.  She is tolerating combination therapy without any side effects.  She denies any recent or recurrent infections.  She denies any new medical conditions.  Patient reports that she has been experiencing pain in the right third MCP joint and PIP joint.  She states that it has been tender and stiff but denies any joint swelling.  She states that the right third digit locks at times.  She describes the pain at an 8 out of 10 currently.  She denies any recent injuries.  She has been taking Tylenol as needed.  She denies any other joint pain or joint swelling at this time.   Activities of Daily Living:  Patient reports morning stiffness for 1 hour.   Patient Reports nocturnal pain.  Difficulty dressing/grooming: Denies Difficulty climbing stairs: Denies Difficulty getting out of chair: Denies Difficulty using hands for taps, buttons, cutlery, and/or writing: Denies  Review of Systems  Constitutional:  Positive for fatigue.  HENT:  Negative for mouth sores and mouth dryness.   Eyes:  Negative for dryness.  Respiratory:  Negative for shortness of breath.   Cardiovascular:  Negative for chest pain and palpitations.  Gastrointestinal:  Negative for blood in stool, constipation and diarrhea.  Endocrine: Negative for increased urination.  Genitourinary:  Negative for involuntary urination.  Musculoskeletal:  Positive for joint pain, joint pain, muscle  weakness and morning stiffness. Negative for gait problem, joint swelling, myalgias, muscle tenderness and myalgias.  Skin:  Negative for color change, rash, hair loss and sensitivity to sunlight.  Allergic/Immunologic: Negative for susceptible to infections.  Neurological:  Negative for dizziness and headaches.  Hematological:  Negative for swollen glands.  Psychiatric/Behavioral:  Negative for depressed mood and sleep disturbance. The patient is not nervous/anxious.     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 (HCC) 06/02/2020   Tendinitis of right ankle 10/12/2019   High risk medication use 01/07/2019   Generalized joint pain 10/20/2018   Major depressive disorder, recurrent episode, moderate (HCC) 08/29/2017   Back pain with sciatica 11/13/2015   Knee osteoarthritis 07/13/2013   Snoring disorder 01/03/2013   Thoracic spine pain 12/29/2012   Vitamin D deficiency 12/08/2012   GERD (gastroesophageal reflux disease) 07/15/2012   Exercise intolerance 11/26/2011   Essential hypertension 11/26/2011   Migraine headache    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 Landau   Depression 2001   doesn't take any meds   Headache(784.0)    occasionally   Human bite of forearm 11/30/2019   Hypothyroidism    was on Levothyroxine-has been off x 4 months   Joint pain    Joint swelling    Morbid obesity (HCC)    Nocturia    Osteoarthritis of right knee 07/13/2013   Peripheral edema    to right leg;takes Furosemide occasionally  and hasnt had one in a month   Syncope    with palpitations; evaluated by [facility] in 2008 with normal echo   Type 2 diabetes mellitus with vascular disease (HCC) 10/01/2021    Family History  Problem Relation Age of Onset   Diabetes Mother    Depression Mother    Diabetes Father        emphsema    Hyperlipidemia Father    Emphysema Father    Rheum arthritis Sister    Breast cancer Sister    Cancer Sister    Healthy Daughter    Healthy Daughter    Healthy Daughter    Colon cancer Neg Hx    Past Surgical History:  Procedure Laterality Date   ABDOMINAL HYSTERECTOMY  1995   no oophorectomy   CARPAL TUNNEL RELEASE  1991   Bilateral   CESAREAN SECTION     X2   CHOLECYSTECTOMY     CHOLECYSTECTOMY, LAPAROSCOPIC  2007   Dr. Lovell Sheehan   COLONOSCOPY N/A 03/03/2013   Procedure: COLONOSCOPY;  Surgeon: Malissa Hippo, MD;  Location: [facility];  Service: Endoscopy;  Laterality: N/A;  930   CRANIECTOMY SUBOCCIPITAL W/ CERVICAL LAMINECTOMY / CHIARI  2007   Repair of Arnold-Chiari malformation   EYE SURGERY     bilateral cataract surgery   JOINT REPLACEMENT Left 2011   KNEE ARTHROSCOPY  1995   Dr. Cleophas Dunker    THYROIDECTOMY, PARTIAL  2004   Left lobectomy - large benign nodule   TONSILLECTOMY     TOTAL KNEE ARTHROPLASTY  07/2009   Left knee , Dr. Dion Saucier    TOTAL KNEE ARTHROPLASTY Right 07/13/2013   Procedure: TOTAL KNEE ARTHROPLASTY;  Surgeon: Eulas Post, MD;  Location: [facility] OR;  Service: Orthopedics;  Laterality: Right;   Social History   Social History Narrative   Is in a new relationship    Immunization History  Administered Date(s) Administered   Influenza Split 01/16/2012   Influenza Whole 02/23/2007, 12/07/2008   Influenza,inj,Quad PF,6+ Mos 12/07/2012, 02/15/2014, 11/28/2014, 01/09/2016, 04/07/2017, 12/18/2017, 02/16/2019, 12/09/2019, 01/02/2021, 11/16/2021   Moderna Covid-19 Vaccine Bivalent Booster 77yrs & up 10/23/2021   Moderna  Sars-Covid-2 Vaccination 06/09/2019, 07/07/2019, 03/06/2020, 10/10/2020   Pneumococcal Polysaccharide-23 07/31/2015   Td 10/23/2005   Tdap 11/30/2019   Zoster Recombinat (Shingrix) 02/02/2021, 04/06/2021     Objective: Vital Signs: BP 135/69 (BP Location: Left Wrist, Patient Position: Sitting, Cuff Size: Normal)   Pulse (!) 59   Resp 15   Ht 5\' 1"  (1.549 m)   Wt (!) 336 lb (152.4 kg)   BMI 63.49 kg/m    Physical Exam Vitals and nursing note reviewed.  Constitutional:      Appearance: She is well-developed.  HENT:     Head: Normocephalic and atraumatic.  Eyes:     Conjunctiva/sclera: Conjunctivae normal.  Cardiovascular:     Rate and Rhythm: Normal rate and regular rhythm.     Heart sounds: Normal heart sounds.  Pulmonary:     Effort: Pulmonary effort is normal.     Breath sounds: Normal breath sounds.  Abdominal:     General: Bowel sounds are normal.     Palpations: Abdomen is soft.  Musculoskeletal:     Cervical back: Normal range of motion.  Skin:    General: Skin is warm and dry.     Capillary Refill: Capillary refill takes less than 2 seconds.  Neurological:     Mental Status: She is alert and oriented to person, place, and time.  Psychiatric:        Behavior: Behavior normal.      Musculoskeletal Exam: Patient remained seated during the examination today.  C-spine has good range of motion.  No midline spinal tenderness or SI joint tenderness.  Shoulder joints and elbow joints have good range of motion.  Both wrist joints have good range of motion.  CMC joint prominence noted bilaterally.  Tenderness over the right third MCP and PIP joint noted.  Difficulty with full flexion of the right third digit.  Hip joints difficult to assess in seated position.  Bilateral knee replacements have good range of motion with no discomfort.  No warmth or effusion of knee joints noted.  Ankle joints have good range of motion with no tenderness or joint swelling.  CDAI Exam: CDAI  Score: 2.8  Patient Global: 4 mm; Provider Global: 4 mm Swollen: 0 ; Tender: 2  Joint Exam 03/22/2022      Right  Left  MCP 3   Tender     PIP 3   Tender        Investigation: No additional findings.  Imaging: No results found.  Recent Labs: Lab Results  Component Value Date   WBC 10.7 03/08/2022   HGB 10.3 (L) 03/08/2022   PLT 430 (H) 03/08/2022   NA 138 03/08/2022   K 4.3 03/08/2022   CL 103 03/08/2022   CO2 26 03/08/2022   GLUCOSE 77 03/08/2022   BUN 19 03/08/2022   CREATININE 1.05 (H) 03/08/2022   BILITOT 0.4 03/08/2022   ALKPHOS 91 01/15/2022   AST 10 03/08/2022   ALT 7 03/08/2022   PROT 7.5 03/08/2022   ALBUMIN 4.4 01/15/2022   CALCIUM 9.2 03/08/2022   GFRAA 85 08/21/2020   QFTBGOLDPLUS NEGATIVE 03/08/2022    Speciality Comments: No specialty comments available.  Procedures:  No procedures performed Allergies: Aleve [naproxen sodium], Penicillins, Adhesive [tape], and Latex   Assessment / Plan:     Visit Diagnoses: Rheumatoid arthritis of multiple sites with negative rheumatoid factor (HCC): She has no synovitis on examination today.  She presents today with tenderness and soreness in the right third MCP and PIP joint.  She has been experiencing discomfort in the right third digit for the past 1 month.  No injury prior to the onset of symptoms.  She has been experiencing locking intermittently but no triggering was noted during the examination today.  Different treatment options were discussed.  She plans on getting arthritis compression gloves and using Voltaren gel topically for pain relief.  If her symptoms persist or worsen she may return for an ultrasound-guided injection.  She is not experiencing other joint pain or joint swelling at this time.  Overall her rheumatoid arthritis remains well-controlled on Rinvoq 15 mg 1 tablet by mouth daily, methotrexate 1.0 mL sq injections once weekly, and folic acid 2 mg daily.  She is tolerating combination therapy  without any side effects and has not missed any  doses recently.  No medication changes will be made at this time.  She was advised to notify us if she develops signs or symptoms of a flare.  She will follow-up in the office in 5 months or sooner if needed.  High risk medication use - Rinvoq 15 mg 1 tablet by mouth daily, MTX 1.0 ml sq injections once weekly, and folic acid 2 mg po daily. CBC and CMP updated on 03/08/22.  Her next lab work will be due in March and every 3 months.  Standing orders for CBC and CMP remain in place.  TB gold negative on 03/08/22.  Discussed the importance of holding rinvoq and MTX if she develops signs or symptoms of an infection and to resume once the infection has completely cleared.  Counseled on the increase risk of venous thrombosis. Counseled about FDA black box warning of MACE (major adverse CV events including cardiovascular death, myocardial infarction, and stroke).    Primary osteoarthritis of both hands: Discussed the importance of joint protection and muscle strengthening.  She plans on getting arthritis compression gloves which she can wear for pain relief.  I also discussed the use of Voltaren gel which she can use as needed for pain relief.  Status post total bilateral knee replacement: Doing well.  Good range of motion with no discomfort.  No warmth or effusion noted.  Primary osteoarthritis of both feet: Ankle joints have good range of motion with no tenderness or joint swelling.  Pedal edema noted bilaterally.  Patient is wearing proper fitting shoes.  DDD (degenerative disc disease), cervical: C-spine has good range of motion with no discomfort.  No symptoms of radiculopathy.  DDD (degenerative disc disease), lumbar: She is not experiencing any increased discomfort in her lower back at this time.  She is no symptoms of radiculopathy.  Other medical conditions are listed as follows:  Essential hypertension: Blood pressure was 157/74 today in the  office.  Blood pressure was rechecked prior to the patient leaving today.  Advised the patient to monitor her blood pressure closely.  Chronic diastolic heart failure (HCC)  History of gastroesophageal reflux (GERD)  Prediabetes  Hx of migraines  History of depression  S/P partial thyroidectomy  Vitamin D deficiency  Family history of rheumatoid arthritis  Orders: No orders of the defined types were placed in this encounter.  No orders of the defined types were placed in this encounter.     Follow-Up Instructions: Return in about 5 months (around 08/21/2022) for Rheumatoid arthritis, Osteoarthritis, DDD.   Gearldine Bienenstock, PA-C  Note - This record has been created using Dragon software.  Chart creation errors have been sought, but may not always  have been located. Such creation errors do not reflect on  the standard of medical care.

## 2022-03-10 LAB — COMPLETE METABOLIC PANEL WITH GFR
AG Ratio: 1 (calc) (ref 1.0–2.5)
ALT: 7 U/L (ref 6–29)
AST: 10 U/L (ref 10–35)
Albumin: 3.7 g/dL (ref 3.6–5.1)
Alkaline phosphatase (APISO): 72 U/L (ref 37–153)
BUN/Creatinine Ratio: 18 (calc) (ref 6–22)
BUN: 19 mg/dL (ref 7–25)
CO2: 26 mmol/L (ref 20–32)
Calcium: 9.2 mg/dL (ref 8.6–10.4)
Chloride: 103 mmol/L (ref 98–110)
Creat: 1.05 mg/dL — ABNORMAL HIGH (ref 0.50–1.03)
Globulin: 3.8 g/dL (calc) — ABNORMAL HIGH (ref 1.9–3.7)
Glucose, Bld: 77 mg/dL (ref 65–99)
Potassium: 4.3 mmol/L (ref 3.5–5.3)
Sodium: 138 mmol/L (ref 135–146)
Total Bilirubin: 0.4 mg/dL (ref 0.2–1.2)
Total Protein: 7.5 g/dL (ref 6.1–8.1)
eGFR: 61 mL/min/{1.73_m2} (ref >=60)

## 2022-03-10 LAB — CBC WITH DIFFERENTIAL/PLATELET
Absolute Monocytes: 792 cells/uL (ref 200–950)
Basophils Absolute: 43 cells/uL (ref 0–200)
Basophils Relative: 0.4 %
Eosinophils Absolute: 171 cells/uL (ref 15–500)
Eosinophils Relative: 1.6 %
HCT: 33.2 % — ABNORMAL LOW (ref 35.0–45.0)
Hemoglobin: 10.3 g/dL — ABNORMAL LOW (ref 11.7–15.5)
Lymphs Abs: 3210 cells/uL (ref 850–3900)
MCH: 25.2 pg — ABNORMAL LOW (ref 27.0–33.0)
MCHC: 31 g/dL — ABNORMAL LOW (ref 32.0–36.0)
MCV: 81.4 fL (ref 80.0–100.0)
MPV: 10 fL (ref 7.5–12.5)
Monocytes Relative: 7.4 %
Neutro Abs: 6484 cells/uL (ref 1500–7800)
Neutrophils Relative %: 60.6 %
Platelets: 430 10*3/uL — ABNORMAL HIGH (ref 140–400)
RBC: 4.08 10*6/uL (ref 3.80–5.10)
RDW: 14 % (ref 11.0–15.0)
Total Lymphocyte: 30 %
WBC: 10.7 10*3/uL (ref 3.8–10.8)

## 2022-03-10 LAB — QUANTIFERON-TB GOLD PLUS
Mitogen-NIL: >10 IU/mL
NIL: 0.03 IU/mL
QuantiFERON-TB Gold Plus: NEGATIVE
TB1-NIL: 0.01 IU/mL
TB2-NIL: 0 IU/mL

## 2022-03-22 ENCOUNTER — Ambulatory Visit: Payer: Medicare HMO | Attending: Physician Assistant | Admitting: Physician Assistant

## 2022-03-22 ENCOUNTER — Encounter: Payer: Self-pay | Admitting: Physician Assistant

## 2022-03-22 VITALS — BP 135/69 | HR 59 | Resp 15 | Ht 61.0 in | Wt 336.0 lb

## 2022-03-22 DIAGNOSIS — M19072 Primary osteoarthritis, left ankle and foot: Secondary | ICD-10-CM

## 2022-03-22 DIAGNOSIS — Z79899 Other long term (current) drug therapy: Secondary | ICD-10-CM | POA: Diagnosis not present

## 2022-03-22 DIAGNOSIS — I10 Essential (primary) hypertension: Secondary | ICD-10-CM

## 2022-03-22 DIAGNOSIS — Z96653 Presence of artificial knee joint, bilateral: Secondary | ICD-10-CM | POA: Diagnosis not present

## 2022-03-22 DIAGNOSIS — M51369 Other intervertebral disc degeneration, lumbar region without mention of lumbar back pain or lower extremity pain: Secondary | ICD-10-CM

## 2022-03-22 DIAGNOSIS — M19071 Primary osteoarthritis, right ankle and foot: Secondary | ICD-10-CM

## 2022-03-22 DIAGNOSIS — M0609 Rheumatoid arthritis without rheumatoid factor, multiple sites: Secondary | ICD-10-CM | POA: Diagnosis not present

## 2022-03-22 DIAGNOSIS — R7303 Prediabetes: Secondary | ICD-10-CM

## 2022-03-22 DIAGNOSIS — Z8261 Family history of arthritis: Secondary | ICD-10-CM

## 2022-03-22 DIAGNOSIS — M19042 Primary osteoarthritis, left hand: Secondary | ICD-10-CM

## 2022-03-22 DIAGNOSIS — Z8659 Personal history of other mental and behavioral disorders: Secondary | ICD-10-CM

## 2022-03-22 DIAGNOSIS — E890 Postprocedural hypothyroidism: Secondary | ICD-10-CM

## 2022-03-22 DIAGNOSIS — M19041 Primary osteoarthritis, right hand: Secondary | ICD-10-CM

## 2022-03-22 DIAGNOSIS — Z8719 Personal history of other diseases of the digestive system: Secondary | ICD-10-CM

## 2022-03-22 DIAGNOSIS — E559 Vitamin D deficiency, unspecified: Secondary | ICD-10-CM

## 2022-03-22 DIAGNOSIS — M5030 Other cervical disc degeneration, unspecified cervical region: Secondary | ICD-10-CM | POA: Diagnosis not present

## 2022-03-22 DIAGNOSIS — I5032 Chronic diastolic (congestive) heart failure: Secondary | ICD-10-CM | POA: Diagnosis not present

## 2022-03-22 DIAGNOSIS — Z8669 Personal history of other diseases of the nervous system and sense organs: Secondary | ICD-10-CM

## 2022-03-22 DIAGNOSIS — M5136 Other intervertebral disc degeneration, lumbar region: Secondary | ICD-10-CM

## 2022-03-22 NOTE — Patient Instructions (Signed)
Standing Labs We placed an order today for your standing lab work.   Please have your standing labs drawn in March and every 3 months  Please have your labs drawn 2 weeks prior to your appointment so that the provider can discuss your lab results at your appointment.  Please note that you may see your imaging and lab results in MyChart before we have reviewed them. We will contact you once all results are reviewed. Please allow our office up to 72 hours to thoroughly review all of the results before contacting the office for clarification of your results.  Lab hours are:   Monday through Thursday from 8:00 am -12:30 pm and 1:00 pm-5:00 pm and Friday from 8:00 am-12:00 pm.  Please be advised, all patients with office appointments requiring lab work will take precedent over walk-in lab work.   Labs are drawn by [facility]. Please bring your co-pay at the time of your lab draw.  You may receive a bill from [facility] for your lab work.  Please note if you are on Hydroxychloroquine and and an order has been placed for a Hydroxychloroquine level, you will need to have it drawn 4 hours or more after your last dose.  If you wish to have your labs drawn at another location, please call the office 24 hours in advance so we can fax the orders.  The office is located at [address], [address], [place], [place] [address] No appointment is necessary.    If you have any questions regarding directions or hours of operation,  please call [phone].   As a reminder, please drink plenty of water prior to coming for your lab work. Thanks!  

## 2022-03-29 DIAGNOSIS — I10 Essential (primary) hypertension: Secondary | ICD-10-CM | POA: Diagnosis not present

## 2022-03-29 DIAGNOSIS — R7303 Prediabetes: Secondary | ICD-10-CM | POA: Diagnosis not present

## 2022-03-29 DIAGNOSIS — Z6841 Body Mass Index (BMI) 40.0 and over, adult: Secondary | ICD-10-CM | POA: Diagnosis not present

## 2022-04-02 ENCOUNTER — Encounter (INDEPENDENT_AMBULATORY_CARE_PROVIDER_SITE_OTHER): Payer: Self-pay | Admitting: Gastroenterology

## 2022-04-02 ENCOUNTER — Ambulatory Visit (INDEPENDENT_AMBULATORY_CARE_PROVIDER_SITE_OTHER): Payer: Medicare HMO | Admitting: Gastroenterology

## 2022-04-02 ENCOUNTER — Telehealth: Payer: Self-pay | Admitting: *Deleted

## 2022-04-02 ENCOUNTER — Telehealth: Payer: Self-pay | Admitting: Family Medicine

## 2022-04-02 VITALS — BP 127/85 | HR 66 | Temp 97.5°F | Ht 61.0 in | Wt 334.5 lb

## 2022-04-02 DIAGNOSIS — K219 Gastro-esophageal reflux disease without esophagitis: Secondary | ICD-10-CM

## 2022-04-02 NOTE — Telephone Encounter (Signed)
Patient contacted the office and states she was given an antibiotic from her dentist. Patient is on MTX. Patient advised she should hold her MTX until she has completed the antibiotic and has been cleared from any infection. Patient expressed understanding.

## 2022-04-02 NOTE — Telephone Encounter (Signed)
Verification of Chronic Condition form  Copied Noted Sleeved  Fax # [phone] and once faxed send to scans for patient chart.

## 2022-04-02 NOTE — Progress Notes (Addendum)
Referring Provider: Kerri Perches, MD Primary Care Physician:  Kerri Perches, MD Primary GI Physician: new   Chief Complaint  Patient presents with   Gastroesophageal Reflux    Patient here today due to issues with reflux. Patient is taking pepcid 40 mg once per day, she states this has helped some. Denies any other current gi issues. Patient has never been on a ppi.   HPI:   [name] is a 62 y.o. female with past medical history of anxiety, depression, DJD, HTN, hypothyroidism, Type 2 DM, RA.   Patient presenting today for GERD/weight loss.   Patient reports that she has had issues with acid reflux for a while, maybe 6-7 months. Started on pepcid by her PCP with some improvement in symptoms, occurring maybe twice per week now on H2B. She reports that she has heartburn and acid regurgitation. She has some issues with nausea depending on certain things she eats. She denies dysphagia. No cough, sore throat or feeling hoarse.   She sees weight loss clinic, was previously on Zepbound, she is now on phentermine as this shot was no longer covered but is supposed to be going back on this. She feels that appetite has been suppressed some since being on these medications, has lost around 40 pounds since doing these medications.    NSAID use: currently on ibuprofen 800mg  daily for the past few days for tooth issues  Social hx: etoh on occasion, no tobacco  Fam hx:no liver disease or crc   Last Colonoscopy:02/2013 external hemorrhoids  Last Endoscopy: never   Recommendations:  Repeat colonoscopy 02/2023  Past Medical History:  Diagnosis Date   Allergy    Annual visit for general adult medical examination with abnormal findings 01/02/2021   Anxiety    Sometimes   Chronic back pain    Degenerative joint disease    Left TKA in 07/2009-Dr. 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 2008   COLONOSCOPY N/A 03/03/2013   Procedure: COLONOSCOPY;  Surgeon: 03/05/2013, MD;  [facility];  Service: Endoscopy;  Laterality: N/A;  930   CRANIECTOMY SUBOCCIPITAL W/ CERVICAL LAMINECTOMY / CHIARI  2007   Repair of Arnold-Chiari malformation   EYE SURGERY     bilateral cataract surgery   JOINT REPLACEMENT Left 2011   KNEE ARTHROSCOPY  1995   Dr. 1996    THYROIDECTOMY, PARTIAL  2004   Left lobectomy - large benign nodule   TONSILLECTOMY     TOTAL KNEE ARTHROPLASTY  07/2009  Left knee , Dr. Dion Saucier    TOTAL KNEE ARTHROPLASTY Right 07/13/2013   Procedure: TOTAL KNEE ARTHROPLASTY;  Surgeon: Eulas Post, MD;  Location: [facility] OR;  Service: Orthopedics;  Laterality: Right;    Current Outpatient Medications  Medication Sig Dispense Refill   famotidine (PEPCID) 40 MG tablet Take 1 tablet (40 mg total) by mouth daily. (Patient taking differently: Take 40 mg by mouth as needed.) 30 tablet 3   folic acid (FOLVITE) 1 MG tablet TAKE 2 TABLETS BY MOUTH EVERY DAY (Patient taking differently: Take 1 mg by mouth daily.) 180 tablet 3   ibuprofen (ADVIL) 800 MG tablet Take 800 mg by mouth every 6 (six) hours as needed.     montelukast (SINGULAIR) 10 MG tablet TAKE  1 TABLET BY MOUTH EVERYDAY AT BEDTIME 90 tablet 1   phentermine (ADIPEX-P) 37.5 MG tablet Take 1 tablet (37.5 mg total) by mouth daily before breakfast. 30 tablet 1   triamterene-hydrochlorothiazide (MAXZIDE-25) 37.5-25 MG tablet TAKE 1 TABLET BY MOUTH EVERY DAY 90 tablet 1   TUBERCULIN SYR 1CC/27GX1/2" (B-D TB SYRINGE 1CC/27GX1/2") 27G X 1/2" 1 ML MISC USE 1 SYRINGE ONCE WEEKLY TO INJECT METHOTREXATE 12 each 3   Upadacitinib ER (RINVOQ) 15 MG TB24 Take 15 mg by mouth daily. 90 tablet 0   VITAMIN D PO Take by mouth daily.     methotrexate 50 MG/2ML injection Inject 0.6 mLs (15 mg total) into the skin once a week. (Patient not taking: Reported on 04/02/2022) 8 mL 0   No current facility-administered medications for this visit.    Allergies as of 04/02/2022 - Review Complete 04/02/2022  Allergen Reaction Noted   Aleve [naproxen sodium] Shortness Of Breath 12/18/2012   Penicillins Swelling 12/18/2012   Adhesive [tape]  07/05/2013   Latex Rash 02/22/2011    Family History  Problem Relation Age of Onset   Diabetes Mother    Depression Mother    Diabetes Father        emphsema    Hyperlipidemia Father    Emphysema Father    Rheum arthritis Sister    Breast cancer Sister    Cancer Sister    Healthy Daughter    Healthy Daughter    Healthy Daughter    Colon cancer Neg Hx     Social History   Socioeconomic History   Marital status: Significant Other    Spouse name: Not on file   Number of children: 3   Years of education: 15   Highest education level: 12th grade  Occupational History   Occupation: Employed seeking disabilty 2012  Tobacco Use   Smoking status: Never    Passive exposure: Never   Smokeless tobacco: Never  Vaping Use   Vaping Use: Never used  Substance and Sexual Activity   Alcohol use: Yes    Comment: occ   Drug use: No   Sexual activity: Yes    Birth control/protection: Condom, Surgical  Other Topics Concern   Not on file  Social History Narrative   Is  in a new relationship    Social Determinants of Health   Financial Resource Strain: Low Risk  (08/02/2020)   Overall Financial Resource Strain (CARDIA)    Difficulty of Paying Living Expenses: Not hard at all  Food Insecurity: No Food Insecurity (08/27/2021)   Hunger Vital Sign    Worried About Running Out of Food in the Last Year: Never true    Ran Out of Food in the Last Year: Never true  Transportation Needs: No Transportation Needs (08/27/2021)   PRAPARE - Hydrologist (Medical): No    Lack of Transportation (Non-Medical): No  Physical Activity: Inactive (08/27/2021)   Exercise Vital Sign    Days of Exercise per Week: 0 days    Minutes of Exercise per Session: 0 min  Stress: No Stress Concern Present (08/02/2020)   [place]    Feeling of Stress : Not at all  Social Connections: Moderately Integrated (08/02/2020)   Social Connection and Isolation Panel [NHANES]    Frequency of Communication with Friends and Family: More than three times a week    Frequency of Social Gatherings with Friends and Family: More than three times a week    Attends Religious Services: More than 4 times per year    Active Member of [employer] or Organizations: Yes    Attends Music therapist: More than 4 times per year    Marital Status: Never married   Review of systems General: negative for malaise, night sweats, fever, chills, weight loss Neck: Negative for lumps, goiter, pain and significant neck swelling Resp: Negative for cough, wheezing, dyspnea at rest CV: Negative for chest pain, leg swelling, palpitations, orthopnea GI: denies melena, hematochezia, nausea, vomiting, diarrhea, constipation, dysphagia, odyonophagia, early satiety or unintentional weight loss. +heartburn +acid regurgitation  MSK: Negative for joint pain or swelling, back pain, and muscle pain. Derm: Negative for itching or  rash Psych: Denies depression, anxiety, memory loss, confusion. No homicidal or suicidal ideation.  Heme: Negative for prolonged bleeding, bruising easily, and swollen nodes. Endocrine: Negative for cold or heat intolerance, polyuria, polydipsia and goiter. Neuro: negative for tremor, gait imbalance, syncope and seizures. The remainder of the review of systems is noncontributory.  Physical Exam: BP 127/85 (BP Location: Left Arm, Patient Position: Sitting, Cuff Size: Large)   Pulse 66   Temp (!) 97.5 F (36.4 C) (Temporal)   Ht 5\' 1"  (1.549 m)   Wt (!) 334 lb 8 oz (151.7 kg)   BMI 63.20 kg/m  General:   Alert and oriented. No distress noted. Pleasant and cooperative.  Head:  Normocephalic and atraumatic. Eyes:  Conjuctiva clear without scleral icterus. Mouth:  Oral mucosa pink and moist. Good dentition. No lesions. Heart: Normal rate and rhythm, s1 and s2 heart sounds present.  Lungs: Clear lung sounds in all lobes. Respirations equal and unlabored. Abdomen:  +BS, soft, non-tender and non-distended. No rebound or guarding. No HSM or masses noted. Derm: No palmar erythema or jaundice Msk:  Symmetrical without gross deformities. Normal posture. Extremities:  Without edema. Neurologic:  Alert and  oriented x4 Psych:  Alert and cooperative. Normal mood and affect.  Invalid input(s): "6 MONTHS"   ASSESSMENT: Bryttany CHARLOTT CALVARIO is a 62 y.o. female presenting today as a new patient for GERD.  Having heartburn and acid regurgitation for the past 6 to 7 months, some improvement on Pepcid 40 mg once daily, however still having some GERD symptoms about twice a week.  She denies dysphagia, odynophagia, unintentional weight loss.  Has some occasional vomiting depending on what she eats though this is also improved with H2 blocker.  At this time, recommend stopping H2 blocker and starting low-dose PPI once daily. No indication for endoscopic evaluation at this time, if she develops new or  worsening UGI symptoms, may need to consider EGD for further evaluation.  Discussed importance of good reflux precautions to include avoiding greasy, spicy,  citrus, tomato-based foods, chocolate, alcohol, caffeine.  She should also avoid eating late in the evenings and stay in an upright position for 2 to 3 hours after eating.   Last colonoscopy for colorectal cancer screening was in December 2014, we will plan to repeat again in December 2024.    PLAN:  Stop pepcid  2. Start protonix 40mg  daily  3. Plan for colonoscopy around December 2024.   4. Reflux precautions  All questions were answered, patient verbalized understanding and is in agreement with plan as outlined above.   Follow Up: 3 months    Analiz Tvedt L. Alver Sorrow, MSN, APRN, AGNP-C Adult-Gerontology Nurse Practitioner [facility]  I have reviewed the note and agree with the APP's assessment as described in this progress note  Maylon Peppers, MD Gastroenterology and Hepatology [facility] Gastroenterology

## 2022-04-02 NOTE — Patient Instructions (Signed)
I have sent protonix 40mg  to your pharmacy, please stop pepcid  Please take this 30 minutes prior to breakfast.  Avoid greasy, spicy, fried, citrus foods, and be mindful that caffeine, carbonated drinks, chocolate and alcohol can increase reflux symptoms. Stay upright 2-3 hours after eating, prior to lying down and avoid eating late in the evenings.  We will plan to repeat colonoscopy around December 2024.  Follow up 3 months

## 2022-04-03 DIAGNOSIS — Z0279 Encounter for issue of other medical certificate: Secondary | ICD-10-CM

## 2022-04-10 ENCOUNTER — Encounter (INDEPENDENT_AMBULATORY_CARE_PROVIDER_SITE_OTHER): Payer: Self-pay

## 2022-04-10 ENCOUNTER — Other Ambulatory Visit (INDEPENDENT_AMBULATORY_CARE_PROVIDER_SITE_OTHER): Payer: Self-pay | Admitting: Gastroenterology

## 2022-04-10 NOTE — Telephone Encounter (Signed)
Per Vikki Ports, I sent you a messageback on [name] but hold off on calling her or replying, she's on methotrexate so I may not be able to start her on protonix, I willl look at it closer in just a bitelsea,

## 2022-04-10 NOTE — Telephone Encounter (Signed)
Hello [name], I am sorry for inconvenience, which medication are you inquiring about? And which Pharmacy do you use?

## 2022-04-12 ENCOUNTER — Other Ambulatory Visit: Payer: Self-pay | Admitting: Family Medicine

## 2022-04-15 ENCOUNTER — Other Ambulatory Visit: Payer: Self-pay | Admitting: Rheumatology

## 2022-04-15 NOTE — Telephone Encounter (Signed)
Next Visit: 09/09/2022  Last Visit: 03/22/2022  Last Fill: 02/27/2022  DX: Rheumatoid arthritis of multiple sites with negative rheumatoid factor   Current Dose per office note on 03/22/2022: MTX 1.0 ml sq injections once weekly   Labs: 03/08/2022 Creatinine remains borderline elevated-1.05 but has improved. Rest of CMP WNL. Hgb has dropped to 10.3.  hematocrit is also low.  Plt count is slightly reactive secondary to anemia.   Please clarify if she has seen hematology/had a workup for anemia? TB gold negative.  Okay to refill MTX?

## 2022-04-22 ENCOUNTER — Encounter: Payer: Self-pay | Admitting: Internal Medicine

## 2022-04-22 ENCOUNTER — Ambulatory Visit (INDEPENDENT_AMBULATORY_CARE_PROVIDER_SITE_OTHER): Payer: Medicare HMO | Admitting: Internal Medicine

## 2022-04-22 VITALS — BP 138/86 | HR 63 | Ht 61.0 in | Wt 336.0 lb

## 2022-04-22 DIAGNOSIS — S76012A Strain of muscle, fascia and tendon of left hip, initial encounter: Secondary | ICD-10-CM

## 2022-04-22 MED ORDER — MELOXICAM 7.5 MG PO TABS: 7.5000 mg | ORAL_TABLET | Freq: Every day | ORAL | 0 refills | Status: DC

## 2022-04-22 NOTE — Assessment & Plan Note (Addendum)
Chronic pain in left hip for estimated 3 years.  This pain comes and goes.  Patient has experienced it 4 times this year.  No falls or trauma to the left hip. Pain runs down left leg to the knee.   Plan: You have strained your muscle I have prescribed Meloxicam for inflamation. Do not take ibuprofen or other NSAIDs while taking this medication I referred you to physical therapy to strengthen this muscle You can purchase a heel lift to go in your right shoe and see if this helps. Your left leg is slightly longer than your right. If you are not improving , discuss seeing sports medicine with Dr.Simpson

## 2022-04-22 NOTE — Progress Notes (Signed)
   HPI:[name] is a 62 y.o. female who presents for evaluation of Hip Pain (LT hip w/ sciatica radiating to left leg/Painful x 3 weeks but not getting any better) . For the details of today's visit, please refer to the assessment and plan.  Physical Exam: Vitals:   04/22/22 0909  BP: 138/86  Pulse: 63  SpO2: 96%  Weight: (!) 336 lb 0.6 oz (152.4 kg)  Height: 5\' 1"  (1.549 m)     Physical Exam Constitutional:      Appearance: She is well-groomed. She is morbidly obese.  Musculoskeletal:     Comments: R leg short than left leg  No gross deformity, scoliosis. TTP over gluteus medius FROM. Strength LEs 5/5 all muscle groups.   Negative SLRs.  Sensation intact to light touch bilaterally. unable to perform piriformis stretches. 4/5  with abduction of L hip        Assessment & Plan:   Sprain, gluteus medius, left, initial encounter Chronic pain in left hip for estimated 3 years.  This pain comes and goes.  Patient has experienced it 4 times this year.  No falls or trauma to the left hip. Pain runs down left leg to the knee.   Plan: You have strained your muscle I have prescribed Meloxicam for inflamattion. Do not take ibuprofen or other NSAIDs while taking this medication I referred you to physical therapy to strengthen this muscle You can purchase a heel lift to go in your right shoe and see if this helps. Your left leg is slightly longer than your right. If you are not improving , discuss seeing sports medicine with Dr.Simpson  Morbid obesity Patient has BMI of 63.  Patient is being counseled regularly by her PCP and use of GIP/GLP-1 has been limited by cost. Currently on on phentermine. She has membership at a gym  Plan: -Referral to 12 week exercise program at [facility])     Lorene Dy, MD

## 2022-04-22 NOTE — Patient Instructions (Addendum)
Thank you for trusting me with your care. To recap, today we discussed the following:    You have strained your muscle I have prescribed Meloxicam for inflamattion. Do not take ibuprofen or other NSAIDs while taking this medication I referred you to physical therapy to strengthen this muscle You can purchase a heel lift to go in your right shoe and see if this helps. Your left leg is slightly longer than your right. If you are not improving , discuss seeing sports medicine with Dr.Simpson  I referred you PrEP, provider referral exercise program at the [facility].

## 2022-04-22 NOTE — Assessment & Plan Note (Signed)
Patient has BMI of 63.  Patient is being counseled regularly by her PCP and use of GIP/GLP-1 has been limited by cost. Currently on on phentermine. She has membership at a gym  Plan: -Referral to 12 week exercise program at [facility])

## 2022-04-24 ENCOUNTER — Other Ambulatory Visit: Payer: Self-pay

## 2022-04-24 ENCOUNTER — Encounter: Payer: Self-pay | Admitting: Physical Therapy

## 2022-04-24 ENCOUNTER — Ambulatory Visit: Payer: Medicare HMO | Attending: Internal Medicine | Admitting: Physical Therapy

## 2022-04-24 DIAGNOSIS — S76012A Strain of muscle, fascia and tendon of left hip, initial encounter: Secondary | ICD-10-CM | POA: Insufficient documentation

## 2022-04-24 DIAGNOSIS — M62838 Other muscle spasm: Secondary | ICD-10-CM | POA: Diagnosis not present

## 2022-04-24 DIAGNOSIS — M5459 Other low back pain: Secondary | ICD-10-CM | POA: Insufficient documentation

## 2022-04-24 NOTE — Therapy (Addendum)
OUTPATIENT PHYSICAL THERAPY THORACOLUMBAR EVALUATION   Patient Name: [name] MRN: [id number] DOB:[birth year], 62 y.o., female Today's Date: 04/24/2022  END OF SESSION:  PT End of Session - 04/24/22 0935     Visit Number 1    Number of Visits 12    Date for PT Re-Evaluation 06/05/22    Authorization Type FOTO AT LEAST EVERY 5TH VISIT.  PROGRESS NOTE AT 10TH VISIT.  KX MODIFIER AFTER 15 VISITS.    PT Start Time 0857    PT Stop Time [phone]    PT Time Calculation (min) 51 min    Activity Tolerance Patient tolerated treatment well    Behavior During Therapy [facility] for tasks assessed/performed             Past Medical History:  Diagnosis Date   Allergy    Annual visit for general adult medical examination with abnormal findings 01/02/2021   Anxiety    Sometimes   Chronic back pain    Degenerative joint disease    Left TKA in 07/2009-Dr. Landau   Depression 2001   doesn't take any meds   Essential hypertension 11/26/2011   Headache(784.0)    occasionally   Human bite of forearm 11/30/2019   Hypothyroidism    was on Levothyroxine-has been off x 4 months   Joint pain    Joint swelling    Morbid obesity ([place])    Nocturia    Osteoarthritis of right knee 07/13/2013   Peripheral edema    to right leg;takes Furosemide occasionally and hasnt had one in a month   Rheumatoid arthritis ([place])    Syncope    with palpitations; evaluated by [facility] in 2008 with normal echo   Type 2 diabetes mellitus with vascular disease ([place]) 10/01/2021   Past Surgical History:  Procedure Laterality Date   ABDOMINAL HYSTERECTOMY  1995   no oophorectomy   CARPAL TUNNEL RELEASE  1991   Bilateral   CESAREAN SECTION     X2   CHOLECYSTECTOMY     CHOLECYSTECTOMY, LAPAROSCOPIC  2007   Dr. Arnoldo Morale   COLONOSCOPY N/A 03/03/2013   Procedure: COLONOSCOPY;  Surgeon: Rogene Houston, MD;  [facility];  Service: Endoscopy;  Laterality: N/A;  930   CRANIECTOMY  [place] / CHIARI  2007   Repair of Arnold-Chiari malformation   EYE SURGERY     bilateral cataract surgery   JOINT REPLACEMENT Left 2011   KNEE ARTHROSCOPY  1995   Dr. Durward Fortes    THYROIDECTOMY, PARTIAL  2004   Left lobectomy - large benign nodule   TONSILLECTOMY     TOTAL KNEE ARTHROPLASTY  07/2009   Left knee , Dr. Mardelle Matte    TOTAL KNEE ARTHROPLASTY Right 07/13/2013   Procedure: TOTAL KNEE ARTHROPLASTY;  Surgeon: Johnny Bridge, MD;  Location: [place];  Service: Orthopedics;  Laterality: Right;   Patient Active Problem List   Diagnosis Date Noted   Sprain, gluteus medius, left, initial encounter 04/22/2022   Stress due to illness of family member 01/20/2022   Seasonal allergies 04/08/2021   Right leg weakness 01/07/2021   Annual visit for general adult medical examination with abnormal findings 01/02/2021   Rheumatoid arthritis with negative rheumatoid factor ([place]) 06/02/2020   Tendinitis of right ankle 10/12/2019   High risk medication use 01/07/2019   Generalized joint pain 10/20/2018   Major depressive disorder, recurrent episode, moderate ([place]) 08/29/2017   Back pain with sciatica 11/13/2015   Knee  osteoarthritis 07/13/2013   Snoring disorder 01/03/2013   Thoracic spine pain 12/29/2012   Vitamin D deficiency 12/08/2012   GERD (gastroesophageal reflux disease) 07/15/2012   Exercise intolerance 11/26/2011   Essential hypertension 11/26/2011   Migraine headache    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   Morbid obesity ([place]) 12/03/2007     REFERRING PROVIDER: Madalyn Rob MD  REFERRING DIAG: Left glut med strain  Rationale for Evaluation and Treatment: Rehabilitation  THERAPY DIAG:  Other low back pain  Other muscle spasm  ONSET DATE: ~3 weeks ago.  SUBJECTIVE:                                                                                                                                                                                            SUBJECTIVE STATEMENT: The patient presents to the clinic with c/o a flare-up of left LB/hip pain about three weeks ago.  She cannot recall anything that caused the pain.  Her pain is rated at an 8/10 today.  Heat has helped decrease the pain some.  Lying down on the left side increases her pain.  She is not reporting pain into her LE's.    PERTINENT HISTORY:  Chronic low back pain, tape and latex allergy, bilateral TKA's, CRANIECTOMY SUBOCCIPITAL W/ CERVICAL LAMINECTOMY / CHIARI   PAIN:  Are you having pain? Yes: NPRS scale: 8/10 Pain location: Left LB/Glut Pain description: Ache, sharp. Aggravating factors: As above. Relieving factors: As above.  PRECAUTIONS: Supervised gait as she is in a lot of pain.  WEIGHT BEARING RESTRICTIONS: No  FALLS:  Has patient fallen in last 6 months? Yes. Number of falls twice.  LIVING ENVIRONMENT: Lives in: House/apartment Has following equipment at home: None  OCCUPATION: Disabled.  PLOF: Independent with basic ADLs  PATIENT GOALS: Reduce pain.   OBJECTIVE:   DIAGNOSTIC FINDINGS:  MRI:  IMPRESSION: 1. Severe L4-5 and L5-S1 facet arthrosis, which may serve as a source of local low back pain. 2. Otherwise mild degenerative disc disease with no spinal canal or neural foraminal stenosis.  PATIENT SURVEYS:  FOTO 34.    POSTURE: increased lumbar lordosis  PALPATION: Tender to palpation over left low back and left gluteus medius area.  LUMBAR ROM:  Full flexion performed slowly and pain upon resumption of upright posture.  Active lumbar extension is 5 degrees.  LOWER EXTREMITY MMT:   Bilateral knee and ankle strength is normal.  Left hip abduction decreased somewhat likely in response to pain.   LUMBAR SPECIAL TESTS:  Some increase in pain with a left SLR compared to right.   TRANSITIONS:  Pain full from sit to stand  with use of armrests.  Sit to supine to sit is also painful.  GAIT: Very antalgic gait. TODAY'S  TREATMENT:                                                                                                                              DATE: Right sdly position with pillow between knees for comfort:  HMP and IFC at 80-150 Hz on 40% scan x 20 minutes to patient affected left gluteal region.   Patient tolerated treatment without complaint with normal modality response following removal of modality.   ASSESSMENT:  CLINICAL IMPRESSION: The patient presents to OPPT with a flare-up of left LB/hip pain with no known mechanism of injury.  She has palpable left low back pain but her CC is the the region of her left glut region.  Her gait is antalgic and transitory movements are very painful.  She had some pain increase with a left SLR compared to right.  The pain has prohibited her from performing ADL's effectively.  Patient will benefit from skilled physical therapy intervention to address pain and deficits.  OBJECTIVE IMPAIRMENTS: Abnormal gait, decreased activity tolerance, decreased strength, increased muscle spasms, postural dysfunction, and pain.   ACTIVITY LIMITATIONS: carrying, lifting, bending, sleeping, bed mobility, bathing, dressing, and locomotion level  PARTICIPATION LIMITATIONS: meal prep, cleaning, and laundry  PERSONAL FACTORS: 1 comorbidity: h/o low back pain  are also affecting patient's functional outcome.   REHAB POTENTIAL: Good  CLINICAL DECISION MAKING: Stable/uncomplicated  EVALUATION COMPLEXITY: Low   GOALS:   SHORT TERM GOALS: Target date: 05/08/22  Ind with an HEP. Goal status: INITIAL  LONG TERM GOALS: Target date: 06/05/22  Perform ADL's with pain not > 3-4/10.  Goal status: INITIAL  2.  Perform transitory movements with pain not > 3-4/10.  Goal status: INITIAL  3.  Walk without antalgia.  Goal status: INITIAL  PLAN:  PT FREQUENCY: 2x/week  PT DURATION: 6 weeks  PLANNED INTERVENTIONS: Therapeutic exercises, Therapeutic activity, Self Care, Joint  mobilization, Dry Needling, Electrical stimulation, Cryotherapy, Moist heat, Ultrasound, Ionotophoresis 4mg /ml Dexamethasone, and Manual therapy.  PLAN FOR NEXT SESSION: Combo e'stim/US, STW/M, HMP and e'stim.  Low-level core exercises, left hip abduction strengthening (supine, hooklying with theraband).   Jedidiah Demartini, [place], PT 04/24/2022, 12:44 PM

## 2022-04-30 ENCOUNTER — Encounter: Payer: Self-pay | Admitting: Physical Therapy

## 2022-04-30 ENCOUNTER — Ambulatory Visit: Payer: Medicare HMO | Admitting: Physical Therapy

## 2022-04-30 DIAGNOSIS — S76012A Strain of muscle, fascia and tendon of left hip, initial encounter: Secondary | ICD-10-CM | POA: Diagnosis not present

## 2022-04-30 DIAGNOSIS — M5459 Other low back pain: Secondary | ICD-10-CM

## 2022-04-30 DIAGNOSIS — M62838 Other muscle spasm: Secondary | ICD-10-CM

## 2022-04-30 NOTE — Therapy (Signed)
OUTPATIENT PHYSICAL THERAPY THORACOLUMBAR TREATMENT   Patient Name: [name] MRN: [id number] DOB:Jan 05, 1961, 62 y.o., female Today's Date: 04/30/2022  END OF SESSION:  PT End of Session - 04/30/22 0859     Visit Number 2    Number of Visits 12    Date for PT Re-Evaluation 06/05/22    Authorization Type FOTO AT LEAST EVERY 5TH VISIT.  PROGRESS NOTE AT 10TH VISIT.  KX MODIFIER AFTER 15 VISITS.    PT Start Time 0900    PT Stop Time [id number]    PT Time Calculation (min) 44 min    Activity Tolerance Patient tolerated treatment well    Behavior During Therapy [facility] for tasks assessed/performed            Past Medical History:  Diagnosis Date   Allergy    Annual visit for general adult medical examination with abnormal findings 01/02/2021   Anxiety    Sometimes   Chronic back pain    Degenerative joint disease    Left TKA in 07/2009-Dr. Landau   Depression 2001   doesn't take any meds   Essential hypertension 11/26/2011   Headache(784.0)    occasionally   Human bite of forearm 11/30/2019   Hypothyroidism    was on Levothyroxine-has been off x 4 months   Joint pain    Joint swelling    Morbid obesity ([place])    Nocturia    Osteoarthritis of right knee 07/13/2013   Peripheral edema    to right leg;takes Furosemide occasionally and hasnt had one in a month   Rheumatoid arthritis ([place])    Syncope    with palpitations; evaluated by [facility] and Vascular in 2008 with normal echo   Type 2 diabetes mellitus with vascular disease ([place]) 10/01/2021   Past Surgical History:  Procedure Laterality Date   ABDOMINAL HYSTERECTOMY  1995   no oophorectomy   CARPAL TUNNEL RELEASE  1991   Bilateral   CESAREAN SECTION     X2   CHOLECYSTECTOMY     CHOLECYSTECTOMY, LAPAROSCOPIC  2007   Dr. Arnoldo Morale   COLONOSCOPY N/A 03/03/2013   Procedure: COLONOSCOPY;  Surgeon: Rogene Houston, MD;  [facility];  Service: Endoscopy;  Laterality: N/A;  930   CRANIECTOMY  [place] / CHIARI  2007   Repair of Arnold-Chiari malformation   EYE SURGERY     bilateral cataract surgery   JOINT REPLACEMENT Left 2011   KNEE ARTHROSCOPY  1995   Dr. Durward Fortes    THYROIDECTOMY, PARTIAL  2004   Left lobectomy - large benign nodule   TONSILLECTOMY     TOTAL KNEE ARTHROPLASTY  07/2009   Left knee , Dr. Mardelle Matte    TOTAL KNEE ARTHROPLASTY Right 07/13/2013   Procedure: TOTAL KNEE ARTHROPLASTY;  Surgeon: Johnny Bridge, MD;  Location: [place];  Service: Orthopedics;  Laterality: Right;   Patient Active Problem List   Diagnosis Date Noted   Sprain, gluteus medius, left, initial encounter 04/22/2022   Stress due to illness of family member 01/20/2022   Seasonal allergies 04/08/2021   Right leg weakness 01/07/2021   Annual visit for general adult medical examination with abnormal findings 01/02/2021   Rheumatoid arthritis with negative rheumatoid factor ([place]) 06/02/2020   Tendinitis of right ankle 10/12/2019   High risk medication use 01/07/2019   Generalized joint pain 10/20/2018   Major depressive disorder, recurrent episode, moderate ([place]) 08/29/2017   Back pain with sciatica 11/13/2015   Knee osteoarthritis  07/13/2013   Snoring disorder 01/03/2013   Thoracic spine pain 12/29/2012   Vitamin D deficiency 12/08/2012   GERD (gastroesophageal reflux disease) 07/15/2012   Exercise intolerance 11/26/2011   Essential hypertension 11/26/2011   Migraine headache    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   Morbid obesity ([place]) 12/03/2007   REFERRING PROVIDER: Madalyn Rob MD  REFERRING DIAG: Left glut med strain  Rationale for Evaluation and Treatment: Rehabilitation  THERAPY DIAG:  Other low back pain  Other muscle spasm  ONSET DATE: ~3 weeks ago.  SUBJECTIVE:                                                                                                                                                                                            SUBJECTIVE STATEMENT: Reports using heating pad at home. Does not lift or pull anything but cannot think of a mechanism of injury.  PERTINENT HISTORY:  Chronic low back pain, tape and latex allergy, bilateral TKA's, CRANIECTOMY SUBOCCIPITAL W/ CERVICAL LAMINECTOMY / CHIARI   PAIN:  Are you having pain? Yes: NPRS scale: 8/10 Pain location: Left LB/Glut Pain description: Ache, sharp. Aggravating factors: As above. Relieving factors: As above.  PRECAUTIONS: Supervised gait as she is in a lot of pain.  PATIENT GOALS: Reduce pain.  OBJECTIVE:   DIAGNOSTIC FINDINGS:  MRI:  IMPRESSION: 1. Severe L4-5 and L5-S1 facet arthrosis, which may serve as a source of local low back pain. 2. Otherwise mild degenerative disc disease with no spinal canal or neural foraminal stenosis.  PATIENT SURVEYS:  FOTO 34.  POSTURE: increased lumbar lordosis  PALPATION: Tender to palpation over left low back and left gluteus medius area.  LUMBAR ROM:  Full flexion performed slowly and pain upon resumption of upright posture.  Active lumbar extension is 5 degrees.  LOWER EXTREMITY MMT:   Bilateral knee and ankle strength is normal.  Left hip abduction decreased somewhat likely in response to pain.    LUMBAR SPECIAL TESTS:  Some increase in pain with a left SLR compared to right.  TRANSITIONS:  Pain full from sit to stand with use of armrests.  Sit to supine to sit is also painful.  GAIT: Very antalgic gait.  TODAY'S TREATMENT:  DATE:                             EXERCISE LOG  Exercise Repetitions and Resistance Comments  Nustep L2 x10 min                    Blank cell = exercise not performed today   Modalities  Date:  Unattended Estim: Lumbar, IFC, 15 mins, Pain Combo: Lumbar, 1.5 w/cm2, 100%, 1 mhz, 10 mins, Pain Hot Pack: Lumbar, 15 mins, Pain  HOME  EXERCISE PROGRAM: [id number]  ASSESSMENT:  CLINICAL IMPRESSION: Patient presented in clinic with increased pain especially at L posterior hip and into buttock. Patient able to tolerate mild Nustep warmup. Patient more comfortable in R SL. Patient provided HEP for multiple variations of figure 4 stretch to assist in reduction of L buttock pain. Patient verbalized understanding of HEP instructions. Normal modalities response noted following removal of the modalities. Most pain is when lying supine as evidenced when transitioning from R SL to supine to sitting.  OBJECTIVE IMPAIRMENTS: Abnormal gait, decreased activity tolerance, decreased strength, increased muscle spasms, postural dysfunction, and pain.   ACTIVITY LIMITATIONS: carrying, lifting, bending, sleeping, bed mobility, bathing, dressing, and locomotion level  PARTICIPATION LIMITATIONS: meal prep, cleaning, and laundry  PERSONAL FACTORS: 1 comorbidity: h/o low back pain  are also affecting patient's functional outcome.   REHAB POTENTIAL: Good  CLINICAL DECISION MAKING: Stable/uncomplicated  EVALUATION COMPLEXITY: Low  GOALS:  SHORT TERM GOALS: Target date: 05/08/22  Ind with an HEP. Goal status: INITIAL  LONG TERM GOALS: Target date: 06/05/22  Perform ADL's with pain not > 3-4/10.  Goal status: INITIAL  2.  Perform transitory movements with pain not > 3-4/10.  Goal status: INITIAL  3.  Walk without antalgia.  Goal status: INITIAL  PLAN:  PT FREQUENCY: 2x/week  PT DURATION: 6 weeks  PLANNED INTERVENTIONS: Therapeutic exercises, Therapeutic activity, Self Care, Joint mobilization, Dry Needling, Electrical stimulation, Cryotherapy, Moist heat, Ultrasound, Ionotophoresis 58m/ml Dexamethasone, and Manual therapy.  PLAN FOR NEXT SESSION: Combo e'stim/US, STW/M, HMP and e'stim.  Low-level core exercises, left hip abduction strengthening (supine, hooklying with theraband).  KStandley Brooking PTA 04/30/2022, 10:35 AM

## 2022-05-02 ENCOUNTER — Ambulatory Visit: Payer: Medicare HMO | Admitting: *Deleted

## 2022-05-02 ENCOUNTER — Encounter: Payer: Self-pay | Admitting: *Deleted

## 2022-05-02 DIAGNOSIS — M5459 Other low back pain: Secondary | ICD-10-CM

## 2022-05-02 DIAGNOSIS — M62838 Other muscle spasm: Secondary | ICD-10-CM

## 2022-05-02 DIAGNOSIS — S76012A Strain of muscle, fascia and tendon of left hip, initial encounter: Secondary | ICD-10-CM | POA: Diagnosis not present

## 2022-05-02 NOTE — Therapy (Signed)
OUTPATIENT PHYSICAL THERAPY THORACOLUMBAR TREATMENT   Patient Name: [name] MRN: [id number] DOB:04-26-1960, 62 y.o., female Today's Date: 05/02/2022  END OF SESSION:  PT End of Session - 05/02/22 0911     Visit Number 3    Number of Visits 12    Date for PT Re-Evaluation 06/05/22    Authorization Type FOTO AT LEAST EVERY 5TH VISIT.  PROGRESS NOTE AT 10TH VISIT.  KX MODIFIER AFTER 15 VISITS.    PT Start Time 0900    PT Stop Time [id number]    PT Time Calculation (min) 50 min            Past Medical History:  Diagnosis Date   Allergy    Annual visit for general adult medical examination with abnormal findings 01/02/2021   Anxiety    Sometimes   Chronic back pain    Degenerative joint disease    Left TKA in 07/2009-[name]   Depression 2001   doesn't take any meds   Essential hypertension 11/26/2011   Headache(784.0)    occasionally   Human bite of forearm 11/30/2019   Hypothyroidism    was on Levothyroxine-has been off x 4 months   Joint pain    Joint swelling    Morbid obesity ([name])    Nocturia    Osteoarthritis of right knee 07/13/2013   Peripheral edema    to right leg;takes Furosemide occasionally and hasnt had one in a month   Rheumatoid arthritis ([name])    Syncope    with palpitations; evaluated by [name] and Vascular in 2008 with normal echo   Type 2 diabetes mellitus with vascular disease ([name]) 10/01/2021   Past Surgical History:  Procedure Laterality Date   ABDOMINAL HYSTERECTOMY  1995   no oophorectomy   CARPAL TUNNEL RELEASE  1991   Bilateral   CESAREAN SECTION     X2   CHOLECYSTECTOMY     CHOLECYSTECTOMY, LAPAROSCOPIC  2007   Dr. Arnoldo Moore   COLONOSCOPY N/A 03/03/2013   Procedure: COLONOSCOPY;  Surgeon: [name];  [facility];  Service: Endoscopy;  Laterality: N/A;  930   CRANIECTOMY [place] / [name]  2007   Repair of Arnold-[name] malformation   EYE SURGERY      bilateral cataract surgery   JOINT REPLACEMENT Left 2011   KNEE ARTHROSCOPY  1995   Dr. Durward Moore    THYROIDECTOMY, PARTIAL  2004   Left lobectomy - large benign nodule   TONSILLECTOMY     TOTAL KNEE ARTHROPLASTY  07/2009   Left knee , Dr. Mardelle Moore    TOTAL KNEE ARTHROPLASTY Right 07/13/2013   Procedure: TOTAL KNEE ARTHROPLASTY;  Surgeon: Vickie Bridge, Moore;  Location[place]  Service: Orthopedics;  Laterality: Right;   Patient Active Problem List   Diagnosis Date Noted   Sprain, gluteus medius, left, initial encounter 04/22/2022   Stress due to illness of family member 01/20/2022   Seasonal allergies 04/08/2021   Right leg weakness 01/07/2021   Annual visit for general adult medical examination with abnormal findings 01/02/2021   Rheumatoid arthritis with negative rheumatoid factor ([place]) 06/02/2020   Tendinitis of right ankle 10/12/2019   High risk medication use 01/07/2019   Generalized joint pain 10/20/2018   Major depressive disorder, recurrent episode, moderate ([place]) 08/29/2017   Back pain with sciatica 11/13/2015   Knee osteoarthritis 07/13/2013   Snoring disorder 01/03/2013   Thoracic spine pain 12/29/2012   Vitamin D deficiency 12/08/2012  GERD (gastroesophageal reflux disease) 07/15/2012   Exercise intolerance 11/26/2011   Essential hypertension 11/26/2011   Migraine headache    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   Morbid obesity ([place]) 12/03/2007   REFERRING PROVIDER: Madalyn Rob Moore  REFERRING DIAG: Left glut med strain  Rationale for Evaluation and Treatment: Rehabilitation  THERAPY DIAG:  Other low back pain  Other muscle spasm  ONSET DATE: ~3 weeks ago.  SUBJECTIVE:                                                                                                                                                                                           SUBJECTIVE STATEMENT:  Pt reports doing okay after last Rx, but sore  PERTINENT HISTORY:  Chronic low  back pain, tape and latex allergy, bilateral TKA's, CRANIECTOMY SUBOCCIPITAL W/ CERVICAL LAMINECTOMY / [name]   PAIN:  Are you having pain? Yes: NPRS scale: 7/10 Pain location: Left LB/Glut Pain description: Ache, sharp. Aggravating factors: As above. Relieving factors: As above.  PRECAUTIONS: Supervised gait as she is in a lot of pain.  PATIENT GOALS: Reduce pain.  OBJECTIVE:   DIAGNOSTIC FINDINGS:  MRI:  IMPRESSION: 1. Severe L4-5 and L5-S1 facet arthrosis, which may serve as a source of local low back pain. 2. Otherwise mild degenerative disc disease with no spinal canal or neural foraminal stenosis.  PATIENT SURVEYS:  FOTO 34.  POSTURE: increased lumbar lordosis  PALPATION: Tender to palpation over left low back and left gluteus medius area.  LUMBAR ROM:  Full flexion performed slowly and pain upon resumption of upright posture.  Active lumbar extension is 5 degrees.  LOWER EXTREMITY MMT:   Bilateral knee and ankle strength is normal.  Left hip abduction decreased somewhat likely in response to pain.    LUMBAR SPECIAL TESTS:  Some increase in pain with a left SLR compared to right.  TRANSITIONS:  Pain full from sit to stand with use of armrests.  Sit to supine to sit is also painful.  GAIT: Very antalgic gait.  TODAY'S TREATMENT:  DATE:                             EXERCISE LOG     05-02-22  Exercise Repetitions and Resistance Comments  Nustep L2 x10 min                    Blank cell = exercise not performed today  Manual STW to LT glute/piriformis with Pt in RT sidelying. Modalities  Date: with Pt in RT side lying Unattended Estim: Lumbar, IFC, 15 mins, Pain Combo: Lumbar, 1.5 w/cm2, 100%, 1 mhz, 12 mins, Pain Hot Pack: Lumbar, 15 mins, Pain  HOME EXERCISE PROGRAM: HNBFFHWD  ASSESSMENT:  CLINICAL IMPRESSION: Patient arrived  today doing fair with LB/ glute pain 7/10. She did well with Nustep and reports home ex stretches are helping. [place] combo, STW, and Estim performed and tolerated well with decreased pain end of session. Notable soreness along piriformis mm during STW.      OBJECTIVE IMPAIRMENTS: Abnormal gait, decreased activity tolerance, decreased strength, increased muscle spasms, postural dysfunction, and pain.   ACTIVITY LIMITATIONS: carrying, lifting, bending, sleeping, bed mobility, bathing, dressing, and locomotion level  PARTICIPATION LIMITATIONS: meal prep, cleaning, and laundry  PERSONAL FACTORS: 1 comorbidity: h/o low back pain  are also affecting patient's functional outcome.   REHAB POTENTIAL: Good  CLINICAL DECISION MAKING: Stable/uncomplicated  EVALUATION COMPLEXITY: Low  GOALS:  SHORT TERM GOALS: Target date: 05/08/22  Ind with an HEP. Goal status: INITIAL  LONG TERM GOALS: Target date: 06/05/22  Perform ADL's with pain not > 3-4/10.  Goal status: INITIAL  2.  Perform transitory movements with pain not > 3-4/10.  Goal status: INITIAL  3.  Walk without antalgia.  Goal status: INITIAL  PLAN:  PT FREQUENCY: 2x/week  PT DURATION: 6 weeks  PLANNED INTERVENTIONS: Therapeutic exercises, Therapeutic activity, Self Care, Joint mobilization, Dry Needling, Electrical stimulation, Cryotherapy, Moist heat, Ultrasound, Ionotophoresis 70m/ml Dexamethasone, and Manual therapy.  PLAN FOR NEXT SESSION: Combo e'stim/US, STW/M, HMP and e'stim.  Low-level core exercises, left hip abduction strengthening (supine, hooklying with theraband).  Gayl Ivanoff,CHRIS, PTA 05/02/2022, 10:52 AM

## 2022-05-07 ENCOUNTER — Ambulatory Visit (INDEPENDENT_AMBULATORY_CARE_PROVIDER_SITE_OTHER): Payer: Medicare HMO | Admitting: Family Medicine

## 2022-05-07 ENCOUNTER — Encounter: Payer: Self-pay | Admitting: Family Medicine

## 2022-05-07 VITALS — BP 131/80 | HR 61 | Ht 61.0 in | Wt 336.0 lb

## 2022-05-07 DIAGNOSIS — E1159 Type 2 diabetes mellitus with other circulatory complications: Secondary | ICD-10-CM

## 2022-05-07 DIAGNOSIS — I10 Essential (primary) hypertension: Secondary | ICD-10-CM | POA: Diagnosis not present

## 2022-05-07 DIAGNOSIS — S76012A Strain of muscle, fascia and tendon of left hip, initial encounter: Secondary | ICD-10-CM

## 2022-05-07 DIAGNOSIS — M0600 Rheumatoid arthritis without rheumatoid factor, unspecified site: Secondary | ICD-10-CM

## 2022-05-07 DIAGNOSIS — M5430 Sciatica, unspecified side: Secondary | ICD-10-CM | POA: Diagnosis not present

## 2022-05-07 DIAGNOSIS — M549 Dorsalgia, unspecified: Secondary | ICD-10-CM | POA: Diagnosis not present

## 2022-05-07 DIAGNOSIS — R7303 Prediabetes: Secondary | ICD-10-CM | POA: Diagnosis not present

## 2022-05-07 DIAGNOSIS — K2100 Gastro-esophageal reflux disease with esophagitis, without bleeding: Secondary | ICD-10-CM

## 2022-05-07 MED ORDER — PREDNISONE 10 MG PO TABS: 10.0000 mg | ORAL_TABLET | Freq: Every day | ORAL | 0 refills | Status: DC

## 2022-05-07 MED ORDER — METHYLPREDNISOLONE ACETATE 80 MG/ML IJ SUSP
80.0000 mg | Freq: Once | INTRAMUSCULAR | Status: AC
  Administered 2022-05-07: 80 mg via INTRAMUSCULAR

## 2022-05-07 MED ORDER — PHENTERMINE HCL 37.5 MG PO TABS: 37.5000 mg | ORAL_TABLET | Freq: Every day | ORAL | 0 refills | Status: DC

## 2022-05-07 NOTE — Patient Instructions (Addendum)
F/u in 8 to 10 weeks, call if you need me sooner  Labs today, lipid, chem 7 and eGFr, hBA1C   One pound per week weight loss   Depo medrol 80 mg iM in office for left buttock pain and 5 day course prednisone is prescribed   Fruit, vegetables, water, protein, set times, and 12 hours no food  Thanks for choosing [facility], we consider it a privelige to serve you.

## 2022-05-08 ENCOUNTER — Ambulatory Visit: Payer: Medicare HMO | Admitting: Physical Therapy

## 2022-05-13 ENCOUNTER — Encounter: Payer: Self-pay | Admitting: Family Medicine

## 2022-05-13 NOTE — Progress Notes (Signed)
[name]     MRN: [id number]      DOB: November 17, 1960   HPI [name] is here for follow up and re-evaluation of chronic medical conditions, medication management and review of any available recent lab and radiology data.  Preventive health is updated, specifically  Cancer screening and Immunization.   Questions or concerns regarding consultations or procedures which the PT has had in the interim are  addressed. The PT denies any adverse reactions to current medications since the last visit.  C/o increased and  uncontrolled left buttock pain radiaiting down thigh  ROS Denies recent fever or chills. Denies sinus pressure, nasal congestion, ear pain or sore throat. Denies chest congestion, productive cough or wheezing. Denies chest pains, palpitations and leg swelling Denies abdominal pain, nausea, vomiting,diarrhea or constipation.   Denies dysuria, frequency, hesitancy or incontinence. . Denies depression, anxiety or insomnia. Denies skin break down or rash.   PE  BP 131/80   Pulse 61   Ht '5\' 1"'$  (1.549 m)   Wt (!) 336 lb (152.4 kg)   SpO2 98%   BMI 63.49 kg/m   Patient alert and oriented and in no cardiopulmonary distress.  HEENT: No facial asymmetry, EOMI,     Neck supple .  Chest: Clear to auscultation bilaterally.  CVS: S1, S2 no murmurs, no S3.Regular rate.  ABD: Soft non tender.   Ext: No edema  MS: decreased  ROM spine,  normal in shoulders, reduced in right hip and normal in  knees.  Skin: Intact, no ulcerations or rash noted.  Psych: Good eye contact, normal affect. Memory intact not anxious or depressed appearing.  CNS: CN 2-12 intact, power,  normal throughout.no focal deficits noted.   Assessment & Plan  Back pain with sciatica Persiting right buttock pain , depomedrol in office followed by short course f oral prednisone  GERD (gastroesophageal reflux disease) Controlled, no change in medication   Essential  hypertension Controlled, no change in medication DASH diet and commitment to daily physical activity for a minimum of 30 minutes discussed and encouraged, as a part of hypertension management. The importance of attaining a healthy weight is also discussed.     05/07/2022    9:17 AM 04/22/2022    9:09 AM 04/02/2022    2:35 PM 04/02/2022    2:29 PM 03/22/2022    9:29 AM 03/22/2022    8:42 AM 02/28/2022   10:31 AM  BP/Weight  Systolic BP A999333 0000000 [id number] A999333 A999333 [id number]  Diastolic BP 80 86 85 75 69 74 88  Wt. (Lbs) 336 336.04  334.5  336   BMI 63.49 kg/m2 63.49 kg/m2  63.2 kg/m2  63.49 kg/m2        Rheumatoid arthritis with negative rheumatoid factor (HCC) Treated by Rheumatology, improved pain control and improved function on therapy  Morbid obesity  Patient re-educated about  the importance of commitment to a  minimum of 150 minutes of exercise per week as able.  The importance of healthy food choices with portion control discussed, as well as eating regularly and within a 12 hour window most days. The need to choose "clean , green" food 50 to 75% of the time is discussed, as well as to make water the primary drink and set a goal of 64 ounces water daily.       05/07/2022    9:17 AM 04/22/2022    9:09 AM 04/02/2022    2:29 PM  Weight /BMI  Weight 336  lb 336 lb 0.6 oz 334 lb 8 oz  Height '5\' 1"'$  (1.549 m) '5\' 1"'$  (1.549 m) '5\' 1"'$  (1.549 m)  BMI 63.49 kg/m2 63.49 kg/m2 63.2 kg/m2    unchanged  Prediabetes Patient educated about the importance of limiting  Carbohydrate intake , the need to commit to daily physical activity for a minimum of 30 minutes , and to commit weight loss. The fact that changes in all these areas will reduce or eliminate all together the development of diabetes is stressed.      Latest Ref Rng & Units 03/08/2022    8:11 AM 01/15/2022   11:21 AM 10/19/2021   10:11 AM 09/19/2021   11:11 AM 07/24/2021   11:20 AM  Diabetic Labs  HbA1c 4.8 - 5.6 %    6.2    Creatinine  0.50 - 1.03 mg/dL 1.05  1.37  1.04  0.87  0.80       05/07/2022    9:17 AM 04/22/2022    9:09 AM 04/02/2022    2:35 PM 04/02/2022    2:29 PM 03/22/2022    9:29 AM 03/22/2022    8:42 AM 02/28/2022   10:31 AM  BP/Weight  Systolic BP A999333 0000000 [id number] A999333 A999333 [id number]  Diastolic BP 80 86 85 75 69 74 88  Wt. (Lbs) 336 336.04  334.5  336   BMI 63.49 kg/m2 63.49 kg/m2  63.2 kg/m2  63.49 kg/m2       10/12/2019    2:20 PM 07/31/2015    8:45 AM  Foot/eye exam completion dates  Foot Form Completion Done Done

## 2022-05-13 NOTE — Assessment & Plan Note (Signed)
Controlled, no change in medication DASH diet and commitment to daily physical activity for a minimum of 30 minutes discussed and encouraged, as a part of hypertension management. The importance of attaining a healthy weight is also discussed.     05/07/2022    9:17 AM 04/22/2022    9:09 AM 04/02/2022    2:35 PM 04/02/2022    2:29 PM 03/22/2022    9:29 AM 03/22/2022    8:42 AM 02/28/2022   10:31 AM  BP/Weight  Systolic BP A999333 0000000 [id number] A999333 A999333 [id number]  Diastolic BP 80 86 85 75 69 74 88  Wt. (Lbs) 336 336.04  334.5  336   BMI 63.49 kg/m2 63.49 kg/m2  63.2 kg/m2  63.49 kg/m2

## 2022-05-13 NOTE — Assessment & Plan Note (Signed)
Persiting right buttock pain , depomedrol in office followed by short course f oral prednisone

## 2022-05-13 NOTE — Assessment & Plan Note (Signed)
Controlled, no change in medication  

## 2022-05-13 NOTE — Assessment & Plan Note (Signed)
Patient educated about the importance of limiting  Carbohydrate intake , the need to commit to daily physical activity for a minimum of 30 minutes , and to commit weight loss. The fact that changes in all these areas will reduce or eliminate all together the development of diabetes is stressed.      Latest Ref Rng & Units 03/08/2022    8:11 AM 01/15/2022   11:21 AM 10/19/2021   10:11 AM 09/19/2021   11:11 AM 07/24/2021   11:20 AM  Diabetic Labs  HbA1c 4.8 - 5.6 %    6.2    Creatinine 0.50 - 1.03 mg/dL 1.05  1.37  1.04  0.87  0.80       05/07/2022    9:17 AM 04/22/2022    9:09 AM 04/02/2022    2:35 PM 04/02/2022    2:29 PM 03/22/2022    9:29 AM 03/22/2022    8:42 AM 02/28/2022   10:31 AM  BP/Weight  Systolic BP A999333 0000000 [id number] A999333 A999333 [id number]  Diastolic BP 80 86 85 75 69 74 88  Wt. (Lbs) 336 336.04  334.5  336   BMI 63.49 kg/m2 63.49 kg/m2  63.2 kg/m2  63.49 kg/m2       10/12/2019    2:20 PM 07/31/2015    8:45 AM  Foot/eye exam completion dates  Foot Form Completion Done Done

## 2022-05-13 NOTE — Assessment & Plan Note (Signed)
Treated by Rheumatology, improved pain control and improved function on therapy

## 2022-05-13 NOTE — Assessment & Plan Note (Signed)
  Patient re-educated about  the importance of commitment to a  minimum of 150 minutes of exercise per week as able.  The importance of healthy food choices with portion control discussed, as well as eating regularly and within a 12 hour window most days. The need to choose "clean , green" food 50 to 75% of the time is discussed, as well as to make water the primary drink and set a goal of 64 ounces water daily.       05/07/2022    9:17 AM 04/22/2022    9:09 AM 04/02/2022    2:29 PM  Weight /BMI  Weight 336 lb 336 lb 0.6 oz 334 lb 8 oz  Height 5' 1"$  (1.549 m) 5' 1"$  (1.549 m) 5' 1"$  (1.549 m)  BMI 63.49 kg/m2 63.49 kg/m2 63.2 kg/m2    unchanged

## 2022-05-14 DIAGNOSIS — E1159 Type 2 diabetes mellitus with other circulatory complications: Secondary | ICD-10-CM | POA: Diagnosis not present

## 2022-05-15 ENCOUNTER — Other Ambulatory Visit: Payer: Self-pay

## 2022-05-15 DIAGNOSIS — R350 Frequency of micturition: Secondary | ICD-10-CM

## 2022-05-15 LAB — LIPID PANEL
Chol/HDL Ratio: 2.2 ratio (ref 0.0–4.4)
Cholesterol, Total: 159 mg/dL (ref 100–199)
HDL: 72 mg/dL (ref >39)
LDL Chol Calc (NIH): 78 mg/dL (ref 0–99)
Triglycerides: 40 mg/dL (ref 0–149)
VLDL Cholesterol Cal: 9 mg/dL (ref 5–40)

## 2022-05-15 LAB — BMP8+EGFR
BUN/Creatinine Ratio: 31 — ABNORMAL HIGH (ref 9–23)
BUN: 30 mg/dL — ABNORMAL HIGH (ref 6–24)
CO2: 20 mmol/L (ref 20–29)
Calcium: 9.3 mg/dL (ref 8.7–10.2)
Chloride: 104 mmol/L (ref 96–106)
Creatinine, Ser: 0.97 mg/dL (ref 0.57–1.00)
Glucose: 108 mg/dL — ABNORMAL HIGH (ref 70–99)
Potassium: 4.8 mmol/L (ref 3.5–5.2)
Sodium: 140 mmol/L (ref 134–144)
eGFR: 67 mL/min/{1.73_m2} (ref >59)

## 2022-05-15 LAB — HEMOGLOBIN A1C
Est. average glucose Bld gHb Est-mCnc: 126 mg/dL
Hgb A1c MFr Bld: 6 % — ABNORMAL HIGH (ref 4.8–5.6)

## 2022-05-16 ENCOUNTER — Ambulatory Visit: Payer: Medicare HMO | Admitting: Physical Therapy

## 2022-05-16 ENCOUNTER — Encounter: Payer: Self-pay | Admitting: Physical Therapy

## 2022-05-16 ENCOUNTER — Encounter: Payer: Self-pay | Admitting: Radiology

## 2022-05-16 DIAGNOSIS — M62838 Other muscle spasm: Secondary | ICD-10-CM

## 2022-05-16 DIAGNOSIS — S76012A Strain of muscle, fascia and tendon of left hip, initial encounter: Secondary | ICD-10-CM | POA: Diagnosis not present

## 2022-05-16 DIAGNOSIS — M5459 Other low back pain: Secondary | ICD-10-CM

## 2022-05-16 NOTE — Therapy (Signed)
OUTPATIENT PHYSICAL THERAPY THORACOLUMBAR TREATMENT   Patient Name: [name] MRN: [id number] DOB:[birth year], 62 y.o., female Today's Date: 05/16/2022  END OF SESSION:  PT End of Session - 05/16/22 1011     Visit Number 4    Number of Visits 12    Date for PT Re-Evaluation 06/05/22    Authorization Type FOTO AT LEAST EVERY 5TH VISIT.  PROGRESS NOTE AT 10TH VISIT.  KX MODIFIER AFTER 15 VISITS.    PT Start Time 0900    PT Stop Time 0951    PT Time Calculation (min) 51 min    Activity Tolerance Patient tolerated treatment well    Behavior During Therapy [facility] for tasks assessed/performed            Past Medical History:  Diagnosis Date   Allergy    Annual visit for general adult medical examination with abnormal findings 01/02/2021   Anxiety    Sometimes   Chronic back pain    Degenerative joint disease    Left TKA in 07/2009-Dr. Landau   Depression 2001   doesn't take any meds   Essential hypertension 11/26/2011   Headache(784.0)    occasionally   Human bite of forearm 11/30/2019   Hypothyroidism    was on Levothyroxine-has been off x 4 months   Joint pain    Joint swelling    Morbid obesity ([place])    Nocturia    Osteoarthritis of right knee 07/13/2013   Peripheral edema    to right leg;takes Furosemide occasionally and hasnt had one in a month   Rheumatoid arthritis ([place])    Syncope    with palpitations; evaluated by [facility] in 2008 with normal echo   Type 2 diabetes mellitus with vascular disease ([place]) 10/01/2021   Past Surgical History:  Procedure Laterality Date   ABDOMINAL HYSTERECTOMY  1995   no oophorectomy   CARPAL TUNNEL RELEASE  1991   Bilateral   CESAREAN SECTION     X2   CHOLECYSTECTOMY     CHOLECYSTECTOMY, LAPAROSCOPIC  2007   Dr. Arnoldo Morale   COLONOSCOPY N/A 03/03/2013   Procedure: COLONOSCOPY;  Surgeon: Rogene Houston, MD;  [facility];  Service: Endoscopy;  Laterality: N/A;  930   CRANIECTOMY  [place] / CHIARI  2007   Repair of Arnold-Chiari malformation   EYE SURGERY     bilateral cataract surgery   JOINT REPLACEMENT Left 2011   KNEE ARTHROSCOPY  1995   Dr. Durward Fortes    THYROIDECTOMY, PARTIAL  2004   Left lobectomy - large benign nodule   TONSILLECTOMY     TOTAL KNEE ARTHROPLASTY  07/2009   Left knee , Dr. Mardelle Matte    TOTAL KNEE ARTHROPLASTY Right 07/13/2013   Procedure: TOTAL KNEE ARTHROPLASTY;  Surgeon: Johnny Bridge, MD;  Location: [place];  Service: Orthopedics;  Laterality: Right;   Patient Active Problem List   Diagnosis Date Noted   Sprain, gluteus medius, left, initial encounter 04/22/2022   Stress due to illness of family member 01/20/2022   Seasonal allergies 04/08/2021   Right leg weakness 01/07/2021   Annual visit for general adult medical examination with abnormal findings 01/02/2021   Rheumatoid arthritis with negative rheumatoid factor ([place]) 06/02/2020   Tendinitis of right ankle 10/12/2019   High risk medication use 01/07/2019   Generalized joint pain 10/20/2018   Major depressive disorder, recurrent episode, moderate ([place]) 08/29/2017   Back pain with sciatica 11/13/2015   Knee osteoarthritis  07/13/2013   Snoring disorder 01/03/2013   Thoracic spine pain 12/29/2012   Vitamin D deficiency 12/08/2012   GERD (gastroesophageal reflux disease) 07/15/2012   Exercise intolerance 11/26/2011   Essential hypertension 11/26/2011   Migraine headache    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   Prediabetes 07/23/2010   Morbid obesity ([place]) 12/03/2007   REFERRING PROVIDER: Madalyn Rob MD  REFERRING DIAG: Left glut med strain  Rationale for Evaluation and Treatment: Rehabilitation  THERAPY DIAG:  Other low back pain  Other muscle spasm  ONSET DATE: ~3 weeks ago.  SUBJECTIVE:                                                                                                                                                                                            SUBJECTIVE STATEMENT: Got an injection last week.  Didn't help much. PERTINENT HISTORY:  Chronic low back pain, tape and latex allergy, bilateral TKA's, CRANIECTOMY SUBOCCIPITAL W/ CERVICAL LAMINECTOMY / CHIARI   PAIN:  Are you having pain? Yes: NPRS scale: 7/10 Pain location: Left LB/Glut Pain description: Ache, sharp. Aggravating factors: As above. Relieving factors: As above.  PRECAUTIONS: Supervised gait as she is in a lot of pain.  PATIENT GOALS: Reduce pain.  OBJECTIVE:   DIAGNOSTIC FINDINGS:  MRI:  IMPRESSION: 1. Severe L4-5 and L5-S1 facet arthrosis, which may serve as a source of local low back pain. 2. Otherwise mild degenerative disc disease with no spinal canal or neural foraminal stenosis.  PATIENT SURVEYS:  FOTO 34.  POSTURE: increased lumbar lordosis  PALPATION: Tender to palpation over left low back and left gluteus medius area.  LUMBAR ROM:  Full flexion performed slowly and pain upon resumption of upright posture.  Active lumbar extension is 5 degrees.  LOWER EXTREMITY MMT:   Bilateral knee and ankle strength is normal.  Left hip abduction decreased somewhat likely in response to pain.    LUMBAR SPECIAL TESTS:  Some increase in pain with a left SLR compared to right.  TRANSITIONS:  Pain full from sit to stand with use of armrests.  Sit to supine to sit is also painful.  GAIT: Very antalgic gait.  TODAY'S TREATMENT:  DATE:                             EXERCISE LOG     05-02-22  Exercise Repetitions and Resistance Comments  Nustep L2 x 11 min                    Blank cell = exercise not performed today  Manual STW to LT glute/piriformis with Pt in RT sidelying x 12 minutes with ischemic release technique utilized f/b IFC at 80-150 Hz on 40% scan x 20 minutes.  Patient tolerated treatment  without complaint with normal modality response following removal of modality.     ASSESSMENT:  CLINICAL IMPRESSION: Injection last week was not very helpful. Left Piriformis very tender today with good response to STW/M.   OBJECTIVE IMPAIRMENTS: Abnormal gait, decreased activity tolerance, decreased strength, increased muscle spasms, postural dysfunction, and pain.   ACTIVITY LIMITATIONS: carrying, lifting, bending, sleeping, bed mobility, bathing, dressing, and locomotion level  PARTICIPATION LIMITATIONS: meal prep, cleaning, and laundry  PERSONAL FACTORS: 1 comorbidity: h/o low back pain  are also affecting patient's functional outcome.   REHAB POTENTIAL: Good  CLINICAL DECISION MAKING: Stable/uncomplicated  EVALUATION COMPLEXITY: Low  GOALS:  SHORT TERM GOALS: Target date: 05/08/22  Ind with an HEP. Goal status: INITIAL  LONG TERM GOALS: Target date: 06/05/22  Perform ADL's with pain not > 3-4/10.  Goal status: INITIAL  2.  Perform transitory movements with pain not > 3-4/10.  Goal status: INITIAL  3.  Walk without antalgia.  Goal status: INITIAL  PLAN:  PT FREQUENCY: 2x/week  PT DURATION: 6 weeks  PLANNED INTERVENTIONS: Therapeutic exercises, Therapeutic activity, Self Care, Joint mobilization, Dry Needling, Electrical stimulation, Cryotherapy, Moist heat, Ultrasound, Ionotophoresis '4mg'$ /ml Dexamethasone, and Manual therapy.  PLAN FOR NEXT SESSION: Combo e'stim/US, STW/M, HMP and e'stim.  Low-level core exercises, left hip abduction strengthening (supine, hooklying with theraband).  Elizabethann Lackey, [place], PT 05/16/2022, 10:35 AM

## 2022-05-20 ENCOUNTER — Encounter: Payer: Self-pay | Admitting: Physical Therapy

## 2022-05-20 ENCOUNTER — Ambulatory Visit: Payer: Medicare PPO | Attending: Internal Medicine | Admitting: Physical Therapy

## 2022-05-20 DIAGNOSIS — M62838 Other muscle spasm: Secondary | ICD-10-CM

## 2022-05-20 DIAGNOSIS — M5459 Other low back pain: Secondary | ICD-10-CM

## 2022-05-20 NOTE — Therapy (Addendum)
OUTPATIENT PHYSICAL THERAPY THORACOLUMBAR TREATMENT   Patient Name: [name] MRN: [id number] DOB:01-Mar-1961, 62 y.o., female Today's Date: 05/20/2022  END OF SESSION:  PT End of Session - 05/20/22 0821     Visit Number 5    Number of Visits 12    Date for PT Re-Evaluation 06/05/22    Authorization Type FOTO AT LEAST EVERY 5TH VISIT.  PROGRESS NOTE AT 10TH VISIT.  KX MODIFIER AFTER 15 VISITS.    PT Start Time 0819    PT Stop Time 0906    PT Time Calculation (min) 47 min    Activity Tolerance Patient tolerated treatment well    Behavior During Therapy [facility] for tasks assessed/performed            Past Medical History:  Diagnosis Date   Allergy    Annual visit for general adult medical examination with abnormal findings 01/02/2021   Anxiety    Sometimes   Chronic back pain    Degenerative joint disease    Left TKA in 07/2009-Dr. Landau   Depression 2001   doesn't take any meds   Essential hypertension 11/26/2011   Headache(784.0)    occasionally   Human bite of forearm 11/30/2019   Hypothyroidism    was on Levothyroxine-has been off x 4 months   Joint pain    Joint swelling    Morbid obesity ([place])    Nocturia    Osteoarthritis of right knee 07/13/2013   Peripheral edema    to right leg;takes Furosemide occasionally and hasnt had one in a month   Rheumatoid arthritis ([place])    Syncope    with palpitations; evaluated by [facility] in 2008 with normal echo   Type 2 diabetes mellitus with vascular disease ([place]) 10/01/2021   Past Surgical History:  Procedure Laterality Date   ABDOMINAL HYSTERECTOMY  1995   no oophorectomy   CARPAL TUNNEL RELEASE  1991   Bilateral   CESAREAN SECTION     X2   CHOLECYSTECTOMY     CHOLECYSTECTOMY, LAPAROSCOPIC  2007   Dr. Arnoldo Morale   COLONOSCOPY N/A 03/03/2013   Procedure: COLONOSCOPY;  Surgeon: Rogene Houston, MD;  [facility];  Service: Endoscopy;  Laterality: N/A;  930   CRANIECTOMY  [place] / CHIARI  2007   Repair of Arnold-Chiari malformation   EYE SURGERY     bilateral cataract surgery   JOINT REPLACEMENT Left 2011   KNEE ARTHROSCOPY  1995   Dr. Durward Fortes    THYROIDECTOMY, PARTIAL  2004   Left lobectomy - large benign nodule   TONSILLECTOMY     TOTAL KNEE ARTHROPLASTY  07/2009   Left knee , Dr. Mardelle Matte    TOTAL KNEE ARTHROPLASTY Right 07/13/2013   Procedure: TOTAL KNEE ARTHROPLASTY;  Surgeon: Johnny Bridge, MD;  Location: [place];  Service: Orthopedics;  Laterality: Right;   Patient Active Problem List   Diagnosis Date Noted   Sprain, gluteus medius, left, initial encounter 04/22/2022   Stress due to illness of family member 01/20/2022   Seasonal allergies 04/08/2021   Right leg weakness 01/07/2021   Annual visit for general adult medical examination with abnormal findings 01/02/2021   Rheumatoid arthritis with negative rheumatoid factor ([place]) 06/02/2020   Tendinitis of right ankle 10/12/2019   High risk medication use 01/07/2019   Generalized joint pain 10/20/2018   Major depressive disorder, recurrent episode, moderate ([place]) 08/29/2017   Back pain with sciatica 11/13/2015   Knee osteoarthritis  07/13/2013   Snoring disorder 01/03/2013   Thoracic spine pain 12/29/2012   Vitamin D deficiency 12/08/2012   GERD (gastroesophageal reflux disease) 07/15/2012   Exercise intolerance 11/26/2011   Essential hypertension 11/26/2011   Migraine headache    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   Prediabetes 07/23/2010   Morbid obesity ([place]) 12/03/2007   REFERRING PROVIDER: Madalyn Rob MD  REFERRING DIAG: Left glut med strain  Rationale for Evaluation and Treatment: Rehabilitation  THERAPY DIAG:  Other low back pain  Other muscle spasm  ONSET DATE: ~3 weeks ago.  SUBJECTIVE:                                                                                                                                                                                            SUBJECTIVE STATEMENT: Reports that her back is doing fairly well. Continues to have pain the longer she stands.  PERTINENT HISTORY:  Chronic low back pain, tape and latex allergy, bilateral TKA's, CRANIECTOMY SUBOCCIPITAL W/ CERVICAL LAMINECTOMY / CHIARI   PAIN:  Are you having pain? Yes: NPRS scale: 5/10 Pain location: Left LB/Glut Pain description: Ache, sharp. Aggravating factors: As above. Relieving factors: As above.  PRECAUTIONS: Supervised gait as she is in a lot of pain.  PATIENT GOALS: Reduce pain.  OBJECTIVE:   DIAGNOSTIC FINDINGS:  MRI:  IMPRESSION: 1. Severe L4-5 and L5-S1 facet arthrosis, which may serve as a source of local low back pain. 2. Otherwise mild degenerative disc disease with no spinal canal or neural foraminal stenosis.  PATIENT SURVEYS:  FOTO 34.  POSTURE: increased lumbar lordosis  PALPATION: Tender to palpation over left low back and left gluteus medius area.  LUMBAR ROM:  Full flexion performed slowly and pain upon resumption of upright posture.  Active lumbar extension is 5 degrees.  LOWER EXTREMITY MMT:   Bilateral knee and ankle strength is normal.  Left hip abduction decreased somewhat likely in response to pain.    LUMBAR SPECIAL TESTS:  Some increase in pain with a left SLR compared to right.  TRANSITIONS:  Pain full from sit to stand with use of armrests.  Sit to supine to sit is also painful.  GAIT: Very antalgic gait.  TODAY'S TREATMENT:  DATE:                             EXERCISE LOG     05/20/22  Exercise Repetitions and Resistance Comments  Nustep L4 x 15 min                    Blank cell = exercise not performed today   Modalities  Date: 05/20/22 Unattended Estim: Hip, IFC, 15 mins, Pain Combo: Hip, 1.5 w/cm2, 100%, 1 mhz, 10 mins, Pain  ASSESSMENT:  CLINICAL  IMPRESSION:  Patient presented in clinic with moderate pain of the R glute especially with prolonged standing but no other complaints. Patient continues to indicate pain at medial L glute area. Normal modalities response noted following removal of the modalities.   OBJECTIVE IMPAIRMENTS: Abnormal gait, decreased activity tolerance, decreased strength, increased muscle spasms, postural dysfunction, and pain.   ACTIVITY LIMITATIONS: carrying, lifting, bending, sleeping, bed mobility, bathing, dressing, and locomotion level  PARTICIPATION LIMITATIONS: meal prep, cleaning, and laundry  PERSONAL FACTORS: 1 comorbidity: h/o low back pain  are also affecting patient's functional outcome.   REHAB POTENTIAL: Good  CLINICAL DECISION MAKING: Stable/uncomplicated  EVALUATION COMPLEXITY: Low  GOALS:  SHORT TERM GOALS: Target date: 05/08/22  Ind with an HEP. Goal status: On-going  LONG TERM GOALS: Target date: 06/05/22  Perform ADL's with pain not > 3-4/10.  Goal status: On-going  2.  Perform transitory movements with pain not > 3-4/10.  Goal status: On-going  3.  Walk without antalgia.  Goal status: On-going  PLAN:  PT FREQUENCY: 2x/week  PT DURATION: 6 weeks  PLANNED INTERVENTIONS: Therapeutic exercises, Therapeutic activity, Self Care, Joint mobilization, Dry Needling, Electrical stimulation, Cryotherapy, Moist heat, Ultrasound, Ionotophoresis '4mg'$ /ml Dexamethasone, and Manual therapy.  PLAN FOR NEXT SESSION: Combo e'stim/US, STW/M, HMP and e'stim.  Low-level core exercises, left hip abduction strengthening (supine, hooklying with theraband).  Standley Brooking, PTA 05/20/2022, 9:09 AM

## 2022-05-24 ENCOUNTER — Ambulatory Visit: Payer: Medicare PPO | Admitting: Physical Therapy

## 2022-05-24 DIAGNOSIS — M5459 Other low back pain: Secondary | ICD-10-CM | POA: Diagnosis not present

## 2022-05-24 DIAGNOSIS — M62838 Other muscle spasm: Secondary | ICD-10-CM | POA: Diagnosis not present

## 2022-05-24 NOTE — Therapy (Signed)
OUTPATIENT PHYSICAL THERAPY THORACOLUMBAR TREATMENT   Patient Name: [name] MRN: [id number] DOB:December 12, 1960, 62 y.o., female Today's Date: 05/24/2022  END OF SESSION:  PT End of Session - 05/24/22 0937     Visit Number 6    Number of Visits 12    Date for PT Re-Evaluation 06/05/22    Authorization Type FOTO AT LEAST EVERY 5TH VISIT.  PROGRESS NOTE AT 10TH VISIT.  KX MODIFIER AFTER 15 VISITS.    PT Start Time 0900    PT Stop Time 0957    PT Time Calculation (min) 57 min    Activity Tolerance Patient tolerated treatment well    Behavior During Therapy [facility] for tasks assessed/performed            Past Medical History:  Diagnosis Date   Allergy    Annual visit for general adult medical examination with abnormal findings 01/02/2021   Anxiety    Sometimes   Chronic back pain    Degenerative joint disease    Left TKA in 07/2009-Dr. Landau   Depression 2001   doesn't take any meds   Essential hypertension 11/26/2011   Headache(784.0)    occasionally   Human bite of forearm 11/30/2019   Hypothyroidism    was on Levothyroxine-has been off x 4 months   Joint pain    Joint swelling    Morbid obesity ([place])    Nocturia    Osteoarthritis of right knee 07/13/2013   Peripheral edema    to right leg;takes Furosemide occasionally and hasnt had one in a month   Rheumatoid arthritis ([place])    Syncope    with palpitations; evaluated by [facility] in 2008 with normal echo   Type 2 diabetes mellitus with vascular disease ([place]) 10/01/2021   Past Surgical History:  Procedure Laterality Date   ABDOMINAL HYSTERECTOMY  1995   no oophorectomy   CARPAL TUNNEL RELEASE  1991   Bilateral   CESAREAN SECTION     X2   CHOLECYSTECTOMY     CHOLECYSTECTOMY, LAPAROSCOPIC  2007   Dr. Arnoldo Morale   COLONOSCOPY N/A 03/03/2013   Procedure: COLONOSCOPY;  Surgeon: Rogene Houston, MD;  [facility];  Service: Endoscopy;  Laterality: N/A;  930   CRANIECTOMY  [place] / CHIARI  2007   Repair of Arnold-Chiari malformation   EYE SURGERY     bilateral cataract surgery   JOINT REPLACEMENT Left 2011   KNEE ARTHROSCOPY  1995   Dr. Durward Fortes    THYROIDECTOMY, PARTIAL  2004   Left lobectomy - large benign nodule   TONSILLECTOMY     TOTAL KNEE ARTHROPLASTY  07/2009   Left knee , Dr. Mardelle Matte    TOTAL KNEE ARTHROPLASTY Right 07/13/2013   Procedure: TOTAL KNEE ARTHROPLASTY;  Surgeon: Johnny Bridge, MD;  Location: [place];  Service: Orthopedics;  Laterality: Right;   Patient Active Problem List   Diagnosis Date Noted   Sprain, gluteus medius, left, initial encounter 04/22/2022   Stress due to illness of family member 01/20/2022   Seasonal allergies 04/08/2021   Right leg weakness 01/07/2021   Annual visit for general adult medical examination with abnormal findings 01/02/2021   Rheumatoid arthritis with negative rheumatoid factor ([place]) 06/02/2020   Tendinitis of right ankle 10/12/2019   High risk medication use 01/07/2019   Generalized joint pain 10/20/2018   Major depressive disorder, recurrent episode, moderate ([place]) 08/29/2017   Back pain with sciatica 11/13/2015   Knee osteoarthritis  07/13/2013   Snoring disorder 01/03/2013   Thoracic spine pain 12/29/2012   Vitamin D deficiency 12/08/2012   GERD (gastroesophageal reflux disease) 07/15/2012   Exercise intolerance 11/26/2011   Essential hypertension 11/26/2011   Migraine headache    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   Prediabetes 07/23/2010   Morbid obesity ([place]) 12/03/2007   REFERRING PROVIDER: Madalyn Rob MD  REFERRING DIAG: Left glut med strain  Rationale for Evaluation and Treatment: Rehabilitation  THERAPY DIAG:  Other low back pain  Other muscle spasm  ONSET DATE: ~3 weeks ago.  SUBJECTIVE:                                                                                                                                                                                            SUBJECTIVE STATEMENT: Patient reports she is improving.    PERTINENT HISTORY:  Chronic low back pain, tape and latex allergy, bilateral TKA's, CRANIECTOMY SUBOCCIPITAL W/ CERVICAL LAMINECTOMY / CHIARI   PAIN:  Are you having pain? Yes: NPRS scale: 5/10 Pain location: Left LB/Glut Pain description: Ache, sharp. Aggravating factors: As above. Relieving factors: As above.  PRECAUTIONS: Supervised gait as she is in a lot of pain.  PATIENT GOALS: Reduce pain.  OBJECTIVE:   DIAGNOSTIC FINDINGS:  MRI:  IMPRESSION: 1. Severe L4-5 and L5-S1 facet arthrosis, which may serve as a source of local low back pain. 2. Otherwise mild degenerative disc disease with no spinal canal or neural foraminal stenosis.  PATIENT SURVEYS:  FOTO 34.  POSTURE: increased lumbar lordosis  PALPATION: Tender to palpation over left low back and left gluteus medius area.  LUMBAR ROM:  Full flexion performed slowly and pain upon resumption of upright posture.  Active lumbar extension is 5 degrees.  LOWER EXTREMITY MMT:   Bilateral knee and ankle strength is normal.  Left hip abduction decreased somewhat likely in response to pain.    LUMBAR SPECIAL TESTS:  Some increase in pain with a left SLR compared to right.  TRANSITIONS:  Pain full from sit to stand with use of armrests.  Sit to supine to sit is also painful.  GAIT: Very antalgic gait.  TODAY'S TREATMENT:  DATE:                             EXERCISE LOG     05/20/22  Exercise Repetitions and Resistance Comments  Nustep L4 x 10 min                    Blank cell = exercise not performed today   Modalities  Date: 05/24/22 Unattended Estim: Hip, IFC, 20 mins, Pain Combo: Hip, 1.5 w/cm2, 100%, 1 mhz, 13 mins, Pain STW/M x 5 minutes. ASSESSMENT:  CLINICAL IMPRESSION:  Patient reports  improvement and states she can turn over in bed with less pain now.  FOTO score has improved to 59.  OBJECTIVE IMPAIRMENTS: Abnormal gait, decreased activity tolerance, decreased strength, increased muscle spasms, postural dysfunction, and pain.   ACTIVITY LIMITATIONS: carrying, lifting, bending, sleeping, bed mobility, bathing, dressing, and locomotion level  PARTICIPATION LIMITATIONS: meal prep, cleaning, and laundry  PERSONAL FACTORS: 1 comorbidity: h/o low back pain  are also affecting patient's functional outcome.   REHAB POTENTIAL: Good  CLINICAL DECISION MAKING: Stable/uncomplicated  EVALUATION COMPLEXITY: Low  GOALS:  SHORT TERM GOALS: Target date: 05/08/22  Ind with an HEP. Goal status: On-going  LONG TERM GOALS: Target date: 06/05/22  Perform ADL's with pain not > 3-4/10.  Goal status: On-going  2.  Perform transitory movements with pain not > 3-4/10.  Goal status: On-going  3.  Walk without antalgia.  Goal status: On-going  PLAN:  PT FREQUENCY: 2x/week  PT DURATION: 6 weeks  PLANNED INTERVENTIONS: Therapeutic exercises, Therapeutic activity, Self Care, Joint mobilization, Dry Needling, Electrical stimulation, Cryotherapy, Moist heat, Ultrasound, Ionotophoresis '4mg'$ /ml Dexamethasone, and Manual therapy.  PLAN FOR NEXT SESSION: Combo e'stim/US, STW/M, HMP and e'stim.  Low-level core exercises, left hip abduction strengthening (supine, hooklying with theraband).  Josha Weekley, [place], PT 05/24/2022, 9:59 AM

## 2022-05-28 ENCOUNTER — Ambulatory Visit: Payer: Medicare PPO | Admitting: *Deleted

## 2022-05-30 ENCOUNTER — Ambulatory Visit: Payer: Medicare PPO | Admitting: *Deleted

## 2022-05-30 DIAGNOSIS — M62838 Other muscle spasm: Secondary | ICD-10-CM

## 2022-05-30 DIAGNOSIS — M5459 Other low back pain: Secondary | ICD-10-CM | POA: Diagnosis not present

## 2022-05-30 NOTE — Therapy (Signed)
OUTPATIENT PHYSICAL THERAPY THORACOLUMBAR TREATMENT   Patient Name: CODA MISKO MRN: [id number] DOB:[birth year], 62 y.o., female Today's Date: 05/30/2022  END OF SESSION:  PT End of Session - 05/30/22 0908     Visit Number 7    Number of Visits 12    Date for PT Re-Evaluation 06/05/22    Authorization Type FOTO AT LEAST EVERY 5TH VISIT.  PROGRESS NOTE AT 10TH VISIT.  KX MODIFIER AFTER 15 VISITS.    PT Start Time 0905            Past Medical History:  Diagnosis Date   Allergy    Annual visit for general adult medical examination with abnormal findings 01/02/2021   Anxiety    Sometimes   Chronic back pain    Degenerative joint disease    Left TKA in 07/2009-Dr. Landau   Depression 2001   doesn't take any meds   Essential hypertension 11/26/2011   Headache(784.0)    occasionally   Human bite of forearm 11/30/2019   Hypothyroidism    was on Levothyroxine-has been off x 4 months   Joint pain    Joint swelling    Morbid obesity ([place])    Nocturia    Osteoarthritis of right knee 07/13/2013   Peripheral edema    to right leg;takes Furosemide occasionally and hasnt had one in a month   Rheumatoid arthritis ([place])    Syncope    with palpitations; evaluated by [facility] in 2008 with normal echo   Type 2 diabetes mellitus with vascular disease ([place]) 10/01/2021   Past Surgical History:  Procedure Laterality Date   ABDOMINAL HYSTERECTOMY  1995   no oophorectomy   CARPAL TUNNEL RELEASE  1991   Bilateral   CESAREAN SECTION     X2   CHOLECYSTECTOMY     CHOLECYSTECTOMY, LAPAROSCOPIC  2007   Dr. Arnoldo Morale   COLONOSCOPY N/A 03/03/2013   Procedure: COLONOSCOPY;  Surgeon: Rogene Houston, MD;  [facility];  Service: Endoscopy;  Laterality: N/A;  930   CRANIECTOMY [place] / CHIARI  2007   Repair of Arnold-Chiari malformation   EYE SURGERY     bilateral cataract surgery   JOINT REPLACEMENT Left 2011   KNEE  ARTHROSCOPY  1995   Dr. Durward Fortes    THYROIDECTOMY, PARTIAL  2004   Left lobectomy - large benign nodule   TONSILLECTOMY     TOTAL KNEE ARTHROPLASTY  07/2009   Left knee , Dr. Mardelle Matte    TOTAL KNEE ARTHROPLASTY Right 07/13/2013   Procedure: TOTAL KNEE ARTHROPLASTY;  Surgeon: Johnny Bridge, MD;  Location: [place];  Service: Orthopedics;  Laterality: Right;   Patient Active Problem List   Diagnosis Date Noted   Sprain, gluteus medius, left, initial encounter 04/22/2022   Stress due to illness of family member 01/20/2022   Seasonal allergies 04/08/2021   Right leg weakness 01/07/2021   Annual visit for general adult medical examination with abnormal findings 01/02/2021   Rheumatoid arthritis with negative rheumatoid factor ([place]) 06/02/2020   Tendinitis of right ankle 10/12/2019   High risk medication use 01/07/2019   Generalized joint pain 10/20/2018   Major depressive disorder, recurrent episode, moderate ([place]) 08/29/2017   Back pain with sciatica 11/13/2015   Knee osteoarthritis 07/13/2013   Snoring disorder 01/03/2013   Thoracic spine pain 12/29/2012   Vitamin D deficiency 12/08/2012   GERD (gastroesophageal reflux disease) 07/15/2012   Exercise intolerance 11/26/2011   Essential hypertension 11/26/2011  Migraine headache    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   Prediabetes 07/23/2010   Morbid obesity ([place]) 12/03/2007   REFERRING PROVIDER: Madalyn Rob MD  REFERRING DIAG: Left glut med strain  Rationale for Evaluation and Treatment: Rehabilitation  THERAPY DIAG:  Other low back pain  Other muscle spasm  ONSET DATE: ~3 weeks ago.  SUBJECTIVE:                                                                                                                                                                                           SUBJECTIVE STATEMENT: Patient reports she is improving with less aching. 50% better   PERTINENT HISTORY:  Chronic low back pain, tape and  latex allergy, bilateral TKA's, CRANIECTOMY SUBOCCIPITAL W/ CERVICAL LAMINECTOMY / CHIARI   PAIN:  Are you having pain? Yes: NPRS scale: 5/10 Pain location: Left LB/Glut Pain description: Ache, sharp. Aggravating factors: As above. Relieving factors: As above.  PRECAUTIONS: Supervised gait as she is in a lot of pain.  PATIENT GOALS: Reduce pain.  OBJECTIVE:   DIAGNOSTIC FINDINGS:  MRI:  IMPRESSION: 1. Severe L4-5 and L5-S1 facet arthrosis, which may serve as a source of local low back pain. 2. Otherwise mild degenerative disc disease with no spinal canal or neural foraminal stenosis.  PATIENT SURVEYS:  FOTO 34.  POSTURE: increased lumbar lordosis  PALPATION: Tender to palpation over left low back and left gluteus medius area.  LUMBAR ROM:  Full flexion performed slowly and pain upon resumption of upright posture.  Active lumbar extension is 5 degrees.  LOWER EXTREMITY MMT:   Bilateral knee and ankle strength is normal.  Left hip abduction decreased somewhat likely in response to pain.    LUMBAR SPECIAL TESTS:  Some increase in pain with a left SLR compared to right.  TRANSITIONS:  Pain full from sit to stand with use of armrests.  Sit to supine to sit is also painful.  GAIT: Very antalgic gait.  TODAY'S TREATMENT:  DATE:                             EXERCISE LOG     05/30/22     LT glut  Exercise Repetitions and Resistance Comments  Nustep L4 x 10 min seat 9                    Blank cell = exercise not performed today   Modalities  Date:  Unattended Estim: Hip, IFC, 15 mins, Pain Combo: Hip, 1.5 w/cm2, 100%, 1 mhz, 13 mins, Pain STW/M x 8 minutes.to LT glut in RT sidelying ASSESSMENT:  CLINICAL IMPRESSION: FOTO performed  Patient reports improvement and states she can turn over in bed with less pain now and is 50% better overall.  Notable soreness during STW  OBJECTIVE IMPAIRMENTS: Abnormal gait, decreased activity tolerance, decreased strength, increased muscle spasms, postural dysfunction, and pain.   ACTIVITY LIMITATIONS: carrying, lifting, bending, sleeping, bed mobility, bathing, dressing, and locomotion level  PARTICIPATION LIMITATIONS: meal prep, cleaning, and laundry  PERSONAL FACTORS: 1 comorbidity: h/o low back pain  are also affecting patient's functional outcome.   REHAB POTENTIAL: Good  CLINICAL DECISION MAKING: Stable/uncomplicated  EVALUATION COMPLEXITY: Low  GOALS:  SHORT TERM GOALS: Target date: 05/08/22  Ind with an HEP. Goal status: On-going  LONG TERM GOALS: Target date: 06/05/22  Perform ADL's with pain not > 3-4/10.  Goal status: On-going  2.  Perform transitory movements with pain not > 3-4/10.  Goal status: On-going  3.  Walk without antalgia.  Goal status: On-going  PLAN:  PT FREQUENCY: 2x/week  PT DURATION: 6 weeks  PLANNED INTERVENTIONS: Therapeutic exercises, Therapeutic activity, Self Care, Joint mobilization, Dry Needling, Electrical stimulation, Cryotherapy, Moist heat, Ultrasound, Ionotophoresis '4mg'$ /ml Dexamethasone, and Manual therapy.  PLAN FOR NEXT SESSION: Combo e'stim/US, STW/M, HMP and e'stim.  Low-level core exercises, left hip abduction strengthening (supine, hooklying with theraband).  Monique Hefty,CHRIS, PTA 05/30/2022, 9:51 AM

## 2022-06-05 ENCOUNTER — Ambulatory Visit: Payer: Medicare PPO | Admitting: Physical Therapy

## 2022-06-05 ENCOUNTER — Encounter: Payer: Self-pay | Admitting: Physical Therapy

## 2022-06-05 DIAGNOSIS — M62838 Other muscle spasm: Secondary | ICD-10-CM

## 2022-06-05 DIAGNOSIS — M5459 Other low back pain: Secondary | ICD-10-CM

## 2022-06-05 NOTE — Therapy (Signed)
OUTPATIENT PHYSICAL THERAPY THORACOLUMBAR TREATMENT   Patient Name: [name] MRN: [id number] DOB:July 01, 1960, 62 y.o., female Today's Date: 06/05/2022  END OF SESSION:  PT End of Session - 06/05/22 0902     Visit Number 8    Number of Visits 12    Date for PT Re-Evaluation 06/05/22    Authorization Type FOTO AT LEAST EVERY 5TH VISIT.  PROGRESS NOTE AT 10TH VISIT.  KX MODIFIER AFTER 15 VISITS.    PT Start Time 0902    PT Stop Time 0955    PT Time Calculation (min) 53 min    Activity Tolerance Patient tolerated treatment well    Behavior During Therapy [facility] for tasks assessed/performed            Past Medical History:  Diagnosis Date   Allergy    Annual visit for general adult medical examination with abnormal findings 01/02/2021   Anxiety    Sometimes   Chronic back pain    Degenerative joint disease    Left TKA in 07/2009-Dr. Landau   Depression 2001   doesn't take any meds   Essential hypertension 11/26/2011   Headache(784.0)    occasionally   Human bite of forearm 11/30/2019   Hypothyroidism    was on Levothyroxine-has been off x 4 months   Joint pain    Joint swelling    Morbid obesity ([place])    Nocturia    Osteoarthritis of right knee 07/13/2013   Peripheral edema    to right leg;takes Furosemide occasionally and hasnt had one in a month   Rheumatoid arthritis ([place])    Syncope    with palpitations; evaluated by [facility] in 2008 with normal echo   Type 2 diabetes mellitus with vascular disease ([place]) 10/01/2021   Past Surgical History:  Procedure Laterality Date   ABDOMINAL HYSTERECTOMY  1995   no oophorectomy   CARPAL TUNNEL RELEASE  1991   Bilateral   CESAREAN SECTION     X2   CHOLECYSTECTOMY     CHOLECYSTECTOMY, LAPAROSCOPIC  2007   Dr. Arnoldo Morale   COLONOSCOPY N/A 03/03/2013   Procedure: COLONOSCOPY;  Surgeon: Rogene Houston, MD;  [facility];  Service: Endoscopy;  Laterality: N/A;  930   CRANIECTOMY  [place] / CHIARI  2007   Repair of Arnold-Chiari malformation   EYE SURGERY     bilateral cataract surgery   JOINT REPLACEMENT Left 2011   KNEE ARTHROSCOPY  1995   Dr. Durward Fortes    THYROIDECTOMY, PARTIAL  2004   Left lobectomy - large benign nodule   TONSILLECTOMY     TOTAL KNEE ARTHROPLASTY  07/2009   Left knee , Dr. Mardelle Matte    TOTAL KNEE ARTHROPLASTY Right 07/13/2013   Procedure: TOTAL KNEE ARTHROPLASTY;  Surgeon: Johnny Bridge, MD;  Location: [place];  Service: Orthopedics;  Laterality: Right;   Patient Active Problem List   Diagnosis Date Noted   Sprain, gluteus medius, left, initial encounter 04/22/2022   Stress due to illness of family member 01/20/2022   Seasonal allergies 04/08/2021   Right leg weakness 01/07/2021   Annual visit for general adult medical examination with abnormal findings 01/02/2021   Rheumatoid arthritis with negative rheumatoid factor ([place]) 06/02/2020   Tendinitis of right ankle 10/12/2019   High risk medication use 01/07/2019   Generalized joint pain 10/20/2018   Major depressive disorder, recurrent episode, moderate ([place]) 08/29/2017   Back pain with sciatica 11/13/2015   Knee osteoarthritis  07/13/2013   Snoring disorder 01/03/2013   Thoracic spine pain 12/29/2012   Vitamin D deficiency 12/08/2012   GERD (gastroesophageal reflux disease) 07/15/2012   Exercise intolerance 11/26/2011   Essential hypertension 11/26/2011   Migraine headache    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   Prediabetes 07/23/2010   Morbid obesity ([place]) 12/03/2007   REFERRING PROVIDER: Madalyn Rob MD  REFERRING DIAG: Left glut med strain  Rationale for Evaluation and Treatment: Rehabilitation  THERAPY DIAG:  Other low back pain  Other muscle spasm  ONSET DATE: ~3 weeks ago.  SUBJECTIVE:                                                                                                                                                                                            SUBJECTIVE STATEMENT: Reports that at times she has a twinge of pain or if she sits she will feel it some but not constantly.  PERTINENT HISTORY:  Chronic low back pain, tape and latex allergy, bilateral TKA's, CRANIECTOMY SUBOCCIPITAL W/ CERVICAL LAMINECTOMY / CHIARI   PAIN:  Are you having pain? Yes: NPRS scale: no score provided/10 Pain location: Left LB/Glut Pain description: Ache, sharp. Aggravating factors: As above. Relieving factors: As above.  PRECAUTIONS: Supervised gait as she is in a lot of pain.  PATIENT GOALS: Reduce pain.  OBJECTIVE:   DIAGNOSTIC FINDINGS:  MRI:  IMPRESSION: 1. Severe L4-5 and L5-S1 facet arthrosis, which may serve as a source of local low back pain. 2. Otherwise mild degenerative disc disease with no spinal canal or neural foraminal stenosis.  PATIENT SURVEYS:  FOTO 34.  POSTURE: increased lumbar lordosis  PALPATION: Tender to palpation over left low back and left gluteus medius area.  LUMBAR ROM:  Full flexion performed slowly and pain upon resumption of upright posture.  Active lumbar extension is 5 degrees.  LOWER EXTREMITY MMT:   Bilateral knee and ankle strength is normal.  Left hip abduction decreased somewhat likely in response to pain.    LUMBAR SPECIAL TESTS:  Some increase in pain with a left SLR compared to right.  TRANSITIONS:  Pain full from sit to stand with use of armrests.  Sit to supine to sit is also painful.  GAIT: Very antalgic gait.  TODAY'S TREATMENT:  DATE:                             EXERCISE LOG     06/05/22     LT glut  Exercise Repetitions and Resistance Comments  Nustep L4 x 19 min seat 8                    Blank cell = exercise not performed today   Modalities  Date: 06/05/22 Unattended Estim: Hip, IFC, 15 mins, Pain Combo: Hip, 1.5 w/cm2,  100%, 1 mhz, 10 mins, Pain  Manual Therapy Soft Tissue Mobilization: L glute, reduce pain    ASSESSMENT:  CLINICAL IMPRESSION:   Patient arrived in clinic with reports that pain is present intermittently but has improved. Patient able to tolerate longer time and distance on Nustep without complaint of pain. Patient reported that she was up for much of the night last night as well. Patient indicated pain today is more in superior L glute region. No complaints of soreness or pain during manual therapy session. Normal modalities response noted following removal of the modalities.  OBJECTIVE IMPAIRMENTS: Abnormal gait, decreased activity tolerance, decreased strength, increased muscle spasms, postural dysfunction, and pain.   ACTIVITY LIMITATIONS: carrying, lifting, bending, sleeping, bed mobility, bathing, dressing, and locomotion level  PARTICIPATION LIMITATIONS: meal prep, cleaning, and laundry  PERSONAL FACTORS: 1 comorbidity: h/o low back pain  are also affecting patient's functional outcome.   REHAB POTENTIAL: Good  CLINICAL DECISION MAKING: Stable/uncomplicated  EVALUATION COMPLEXITY: Low  GOALS:  SHORT TERM GOALS: Target date: 05/08/22  Ind with an HEP. Goal status: On-going  LONG TERM GOALS: Target date: 06/05/22  Perform ADL's with pain not > 3-4/10.  Goal status: On-going  2.  Perform transitory movements with pain not > 3-4/10.  Goal status: On-going  3.  Walk without antalgia.  Goal status: On-going  PLAN:  PT FREQUENCY: 2x/week  PT DURATION: 6 weeks  PLANNED INTERVENTIONS: Therapeutic exercises, Therapeutic activity, Self Care, Joint mobilization, Dry Needling, Electrical stimulation, Cryotherapy, Moist heat, Ultrasound, Ionotophoresis 4mg /ml Dexamethasone, and Manual therapy.  PLAN FOR NEXT SESSION: Combo e'stim/US, STW/M, HMP and e'stim.  Low-level core exercises, left hip abduction strengthening (supine, hooklying with theraband).  Standley Brooking,  PTA 06/05/2022, 9:58 AM

## 2022-06-12 ENCOUNTER — Encounter: Payer: Medicare PPO | Admitting: Physical Therapy

## 2022-06-18 ENCOUNTER — Ambulatory Visit: Payer: Medicare PPO | Attending: Internal Medicine | Admitting: Physical Therapy

## 2022-06-18 ENCOUNTER — Encounter: Payer: Self-pay | Admitting: Physical Therapy

## 2022-06-18 DIAGNOSIS — M5459 Other low back pain: Secondary | ICD-10-CM | POA: Insufficient documentation

## 2022-06-18 DIAGNOSIS — M62838 Other muscle spasm: Secondary | ICD-10-CM

## 2022-06-18 NOTE — Therapy (Addendum)
OUTPATIENT PHYSICAL THERAPY THORACOLUMBAR TREATMENT   Patient Name: [name] MRN: [id number] DOB:Aug 15, 1960, 62 y.o., female Today's Date: 06/18/2022  END OF SESSION:  PT End of Session - 06/18/22 0910     Visit Number 9    Number of Visits 12    Date for PT Re-Evaluation 06/05/22    Authorization Type FOTO AT LEAST EVERY 5TH VISIT.  PROGRESS NOTE AT 10TH VISIT.  KX MODIFIER AFTER 15 VISITS.    PT Start Time 0901    PT Stop Time [id number]    PT Time Calculation (min) 46 min    Activity Tolerance Patient tolerated treatment well    Behavior During Therapy [facility] for tasks assessed/performed            Past Medical History:  Diagnosis Date   Allergy    Annual visit for general adult medical examination with abnormal findings 01/02/2021   Anxiety    Sometimes   Chronic back pain    Degenerative joint disease    Left TKA in 07/2009-Dr. Landau   Depression 2001   doesn't take any meds   Essential hypertension 11/26/2011   Headache(784.0)    occasionally   Human bite of forearm 11/30/2019   Hypothyroidism    was on Levothyroxine-has been off x 4 months   Joint pain    Joint swelling    Morbid obesity    Nocturia    Osteoarthritis of right knee 07/13/2013   Peripheral edema    to right leg;takes Furosemide occasionally and hasnt had one in a month   Rheumatoid arthritis    Syncope    with palpitations; evaluated by [facility] in 2008 with normal echo   Type 2 diabetes mellitus with vascular disease 10/01/2021   Past Surgical History:  Procedure Laterality Date   ABDOMINAL HYSTERECTOMY  1995   no oophorectomy   CARPAL TUNNEL RELEASE  1991   Bilateral   CESAREAN SECTION     X2   CHOLECYSTECTOMY     CHOLECYSTECTOMY, LAPAROSCOPIC  2007   Dr. Arnoldo Morale   COLONOSCOPY N/A 03/03/2013   Procedure: COLONOSCOPY;  Surgeon: Rogene Houston, MD;  [facility];  Service: Endoscopy;  Laterality: N/A;  930   CRANIECTOMY [place] / CHIARI  2007   Repair of Arnold-Chiari malformation   EYE SURGERY     bilateral cataract surgery   JOINT REPLACEMENT Left 2011   KNEE ARTHROSCOPY  1995   Dr. Durward Fortes    THYROIDECTOMY, PARTIAL  2004   Left lobectomy - large benign nodule   TONSILLECTOMY     TOTAL KNEE ARTHROPLASTY  07/2009   Left knee , Dr. Mardelle Matte    TOTAL KNEE ARTHROPLASTY Right 07/13/2013   Procedure: TOTAL KNEE ARTHROPLASTY;  Surgeon: Johnny Bridge, MD;  Location: [place];  Service: Orthopedics;  Laterality: Right;   Patient Active Problem List   Diagnosis Date Noted   Sprain, gluteus medius, left, initial encounter 04/22/2022   Stress due to illness of family member 01/20/2022   Seasonal allergies 04/08/2021   Right leg weakness 01/07/2021   Annual visit for general adult medical examination with abnormal findings 01/02/2021   Rheumatoid arthritis with negative rheumatoid factor 06/02/2020   Tendinitis of right ankle 10/12/2019   High risk medication use 01/07/2019   Generalized joint pain 10/20/2018   Major depressive disorder, recurrent episode, moderate 08/29/2017   Back pain with sciatica 11/13/2015   Knee osteoarthritis 07/13/2013   Snoring disorder  01/03/2013   Thoracic spine pain 12/29/2012   Vitamin D deficiency 12/08/2012   GERD (gastroesophageal reflux disease) 07/15/2012   Exercise intolerance 11/26/2011   Essential hypertension 11/26/2011   Migraine headache    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   Prediabetes 07/23/2010   Morbid obesity 12/03/2007   REFERRING PROVIDER: Madalyn Rob MD  REFERRING DIAG: Left glut med strain  Rationale for Evaluation and Treatment: Rehabilitation  THERAPY DIAG:  Other low back pain  Other muscle spasm  ONSET DATE: ~3 weeks ago.  SUBJECTIVE:                                                                                                                                                                                            SUBJECTIVE STATEMENT: Reports feeling a lot better.  PERTINENT HISTORY:  Chronic low back pain, tape and latex allergy, bilateral TKA's, CRANIECTOMY SUBOCCIPITAL W/ CERVICAL LAMINECTOMY / CHIARI   PAIN:  Are you having pain? Yes: NPRS scale: 3/10 Pain location: Left LB/Glut Pain description: Ache, sharp. Aggravating factors: As above. Relieving factors: As above.  PRECAUTIONS: Supervised gait as she is in a lot of pain.  PATIENT GOALS: Reduce pain.  OBJECTIVE:   DIAGNOSTIC FINDINGS:  MRI:  IMPRESSION: 1. Severe L4-5 and L5-S1 facet arthrosis, which may serve as a source of local low back pain. 2. Otherwise mild degenerative disc disease with no spinal canal or neural foraminal stenosis.  PATIENT SURVEYS:  FOTO 34.  POSTURE: increased lumbar lordosis  PALPATION: Tender to palpation over left low back and left gluteus medius area.  LUMBAR ROM:  Full flexion performed slowly and pain upon resumption of upright posture.  Active lumbar extension is 5 degrees.  LOWER EXTREMITY MMT:   Bilateral knee and ankle strength is normal.  Left hip abduction decreased somewhat likely in response to pain.    LUMBAR SPECIAL TESTS:  Some increase in pain with a left SLR compared to right.  TRANSITIONS:  Pain full from sit to stand with use of armrests.  Sit to supine to sit is also painful.  GAIT: Very antalgic gait.  TODAY'S TREATMENT:  DATE:                             EXERCISE LOG     06/18/22   LT glut  Exercise Repetitions and Resistance Comments  Nustep L4 x 17 min seat 10                    Blank cell = exercise not performed today   Modalities  Date: 06/18/22 Unattended Estim: Hip, IFC, 15 mins, Pain Combo: Hip, 1.5 w/cm2, 100%, 1 mhz, 10 mins, Pain  ASSESSMENT:  CLINICAL IMPRESSION:   Patient presented in clinic with reports of improvement  overall in symptoms and pain. Patient reported minimal pain during treatment today. No soreness or tenderness with palpation of treated area. Normal modalities response noted following removal of the modalities.  OBJECTIVE IMPAIRMENTS: Abnormal gait, decreased activity tolerance, decreased strength, increased muscle spasms, postural dysfunction, and pain.   ACTIVITY LIMITATIONS: carrying, lifting, bending, sleeping, bed mobility, bathing, dressing, and locomotion level  PARTICIPATION LIMITATIONS: meal prep, cleaning, and laundry  PERSONAL FACTORS: 1 comorbidity: h/o low back pain  are also affecting patient's functional outcome.   REHAB POTENTIAL: Good  CLINICAL DECISION MAKING: Stable/uncomplicated  EVALUATION COMPLEXITY: Low  GOALS:  SHORT TERM GOALS: Target date: 05/08/22  Ind with an HEP. Goal status: On-going  LONG TERM GOALS: Target date: 06/05/22  Perform ADL's with pain not > 3-4/10.  Goal status: On-going  2.  Perform transitory movements with pain not > 3-4/10.  Goal status: On-going  3.  Walk without antalgia.  Goal status: On-going  PLAN:  PT FREQUENCY: 2x/week  PT DURATION: 6 weeks  PLANNED INTERVENTIONS: Therapeutic exercises, Therapeutic activity, Self Care, Joint mobilization, Dry Needling, Electrical stimulation, Cryotherapy, Moist heat, Ultrasound, Ionotophoresis 4mg /ml Dexamethasone, and Manual therapy.  PLAN FOR NEXT SESSION: Combo e'stim/US, STW/M, HMP and e'stim.  Low-level core exercises, left hip abduction strengthening (supine, hooklying with theraband).  Standley Brooking, PTA 06/18/2022, 9:49 AM

## 2022-06-25 ENCOUNTER — Encounter: Payer: Self-pay | Admitting: Physical Therapy

## 2022-06-25 ENCOUNTER — Ambulatory Visit: Payer: Medicare PPO | Admitting: Physical Therapy

## 2022-06-25 DIAGNOSIS — M5459 Other low back pain: Secondary | ICD-10-CM

## 2022-06-25 DIAGNOSIS — M62838 Other muscle spasm: Secondary | ICD-10-CM

## 2022-06-25 NOTE — Therapy (Signed)
OUTPATIENT PHYSICAL THERAPY THORACOLUMBAR TREATMENT   Patient Name: [name] MRN: [id number] DOB:1961-03-11, 62 y.o., female Today's Date: 06/25/2022  END OF SESSION:  PT End of Session - 06/25/22 0903     Visit Number 10    Number of Visits 12    Date for PT Re-Evaluation 06/05/22    Authorization Type FOTO AT LEAST EVERY 5TH VISIT.  PROGRESS NOTE AT 10TH VISIT.  KX MODIFIER AFTER 15 VISITS.    PT Start Time 0901    PT Stop Time 0945    PT Time Calculation (min) 44 min    Activity Tolerance Patient tolerated treatment well    Behavior During Therapy [facility] for tasks assessed/performed            Past Medical History:  Diagnosis Date   Allergy    Annual visit for general adult medical examination with abnormal findings 01/02/2021   Anxiety    Sometimes   Chronic back pain    Degenerative joint disease    Left TKA in 07/2009-Dr. Landau   Depression 2001   doesn't take any meds   Essential hypertension 11/26/2011   Headache(784.0)    occasionally   Human bite of forearm 11/30/2019   Hypothyroidism    was on Levothyroxine-has been off x 4 months   Joint pain    Joint swelling    Morbid obesity    Nocturia    Osteoarthritis of right knee 07/13/2013   Peripheral edema    to right leg;takes Furosemide occasionally and hasnt had one in a month   Rheumatoid arthritis    Syncope    with palpitations; evaluated by [facility] in 2008 with normal echo   Type 2 diabetes mellitus with vascular disease 10/01/2021   Past Surgical History:  Procedure Laterality Date   ABDOMINAL HYSTERECTOMY  1995   no oophorectomy   CARPAL TUNNEL RELEASE  1991   Bilateral   CESAREAN SECTION     X2   CHOLECYSTECTOMY     CHOLECYSTECTOMY, LAPAROSCOPIC  2007   Dr. Lovell SheehanJenkins   COLONOSCOPY N/A 03/03/2013   Procedure: COLONOSCOPY;  Surgeon: Malissa HippoNajeeb U Rehman, MD;  [facility];  Service: Endoscopy;  Laterality: N/A;  930   CRANIECTOMY SUBOCCIPITAL W/  CERVICAL LAMINECTOMY / CHIARI  2007   Repair of Arnold-Chiari malformation   EYE SURGERY     bilateral cataract surgery   JOINT REPLACEMENT Left 2011   KNEE ARTHROSCOPY  1995   Dr. Cleophas DunkerWhitfield    THYROIDECTOMY, PARTIAL  2004   Left lobectomy - large benign nodule   TONSILLECTOMY     TOTAL KNEE ARTHROPLASTY  07/2009   Left knee , Dr. Dion SaucierLandau    TOTAL KNEE ARTHROPLASTY Right 07/13/2013   Procedure: TOTAL KNEE ARTHROPLASTY;  Surgeon: Eulas PostJoshua P Landau, MD;  Location: [facility] OR;  Service: Orthopedics;  Laterality: Right;   Patient Active Problem List   Diagnosis Date Noted   Sprain, gluteus medius, left, initial encounter 04/22/2022   Stress due to illness of family member 01/20/2022   Seasonal allergies 04/08/2021   Right leg weakness 01/07/2021   Annual visit for general adult medical examination with abnormal findings 01/02/2021   Rheumatoid arthritis with negative rheumatoid factor 06/02/2020   Tendinitis of right ankle 10/12/2019   High risk medication use 01/07/2019   Generalized joint pain 10/20/2018   Major depressive disorder, recurrent episode, moderate 08/29/2017   Back pain with sciatica 11/13/2015   Knee osteoarthritis 07/13/2013   Snoring disorder  01/03/2013   Thoracic spine pain 12/29/2012   Vitamin D deficiency 12/08/2012   GERD (gastroesophageal reflux disease) 07/15/2012   Exercise intolerance 11/26/2011   Essential hypertension 11/26/2011   Migraine headache    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   Prediabetes 07/23/2010   Morbid obesity 12/03/2007   REFERRING PROVIDER: Albertha Ghee MD  REFERRING DIAG: Left glut med strain  Rationale for Evaluation and Treatment: Rehabilitation  THERAPY DIAG:  Other low back pain  Other muscle spasm  ONSET DATE: ~3 weeks ago.  SUBJECTIVE:                                                                                                                                                                                            SUBJECTIVE STATEMENT: Reports feeling a lot better and doing much better. Requests to cut treatment shorter as she has another obligation she must attend to.  PERTINENT HISTORY:  Chronic low back pain, tape and latex allergy, bilateral TKA's, CRANIECTOMY SUBOCCIPITAL W/ CERVICAL LAMINECTOMY / CHIARI   PAIN:  Are you having pain? Yes: NPRS scale: 2/10 Pain location: Left LB/Glut Pain description: Ache, sharp. Aggravating factors: As above. Relieving factors: As above.  PRECAUTIONS: Supervised gait as she is in a lot of pain.  PATIENT GOALS: Reduce pain.  OBJECTIVE:   DIAGNOSTIC FINDINGS:  MRI:  IMPRESSION: 1. Severe L4-5 and L5-S1 facet arthrosis, which may serve as a source of local low back pain. 2. Otherwise mild degenerative disc disease with no spinal canal or neural foraminal stenosis.  PATIENT SURVEYS:  FOTO 83  POSTURE: increased lumbar lordosis  PALPATION: Tender to palpation over left low back and left gluteus medius area.  LUMBAR ROM:  Full flexion performed slowly and pain upon resumption of upright posture.  Active lumbar extension is 5 degrees.  LOWER EXTREMITY MMT:   Bilateral knee and ankle strength is normal.  Left hip abduction decreased somewhat likely in response to pain.    LUMBAR SPECIAL TESTS:  Some increase in pain with a left SLR compared to right.  TRANSITIONS:  Pain full from sit to stand with use of armrests.  Sit to supine to sit is also painful.  GAIT: Very antalgic gait.  TODAY'S TREATMENT:  DATE:                             EXERCISE LOG     06/25/22   LT glut  Exercise Repetitions and Resistance Comments  Nustep L4 x 12 min seat 10                    Blank cell = exercise not performed today   Manual Therapy Soft Tissue Mobilization: L glute, to reduce mild tone and tenderness    Modalities  Date:  06/25/22 Unattended Estim: Hip, Pre-Mod, 15 mins, Tone  ASSESSMENT:  CLINICAL IMPRESSION:   Patient presented in clinic with no complaints and overall improvement. Patient able to achieve all goals but wishes to be on hold in case symptoms return. Patient had mild tone of the L upper glute and a mild tenderness as well. Normal stimulation response noted following removal of the modality.   OBJECTIVE IMPAIRMENTS: Abnormal gait, decreased activity tolerance, decreased strength, increased muscle spasms, postural dysfunction, and pain.   ACTIVITY LIMITATIONS: carrying, lifting, bending, sleeping, bed mobility, bathing, dressing, and locomotion level  PARTICIPATION LIMITATIONS: meal prep, cleaning, and laundry  PERSONAL FACTORS: 1 comorbidity: h/o low back pain  are also affecting patient's functional outcome.   REHAB POTENTIAL: Good  CLINICAL DECISION MAKING: Stable/uncomplicated  EVALUATION COMPLEXITY: Low  GOALS:  SHORT TERM GOALS: Target date: 05/08/22  Ind with an HEP. Goal status: MET  LONG TERM GOALS: Target date: 06/05/22  Perform ADL's with pain not > 3-4/10.  Goal status: MET  2.  Perform transitory movements with pain not > 3-4/10.  Goal status: MET  3.  Walk without antalgia.  Goal status: MET  PLAN:  PT FREQUENCY: 2x/week  PT DURATION: 6 weeks  PLANNED INTERVENTIONS: Therapeutic exercises, Therapeutic activity, Self Care, Joint mobilization, Dry Needling, Electrical stimulation, Cryotherapy, Moist heat, Ultrasound, Ionotophoresis 4mg /ml Dexamethasone, and Manual therapy.  PLAN FOR NEXT SESSION: On hold due to good progress.  Marvell Fuller, PTA 06/25/2022, 9:48 AM

## 2022-07-02 ENCOUNTER — Ambulatory Visit: Payer: Medicare HMO | Admitting: Family Medicine

## 2022-07-04 ENCOUNTER — Encounter: Payer: Self-pay | Admitting: Family Medicine

## 2022-07-04 ENCOUNTER — Ambulatory Visit (INDEPENDENT_AMBULATORY_CARE_PROVIDER_SITE_OTHER): Payer: Medicare HMO | Admitting: Gastroenterology

## 2022-07-05 MED ORDER — FUROSEMIDE 20 MG PO TABS: ORAL_TABLET | ORAL | 0 refills | Status: DC

## 2022-07-05 MED ORDER — POTASSIUM CHLORIDE CRYS ER 20 MEQ PO TBCR
EXTENDED_RELEASE_TABLET | ORAL | 0 refills | Status: DC
Start: 2022-07-05 — End: 2022-12-05

## 2022-07-18 ENCOUNTER — Encounter: Payer: Self-pay | Admitting: *Deleted

## 2022-07-18 ENCOUNTER — Other Ambulatory Visit: Payer: Self-pay | Admitting: Physician Assistant

## 2022-07-26 DIAGNOSIS — M62830 Muscle spasm of back: Secondary | ICD-10-CM | POA: Diagnosis not present

## 2022-07-26 DIAGNOSIS — M549 Dorsalgia, unspecified: Secondary | ICD-10-CM | POA: Diagnosis not present

## 2022-07-26 DIAGNOSIS — R030 Elevated blood-pressure reading, without diagnosis of hypertension: Secondary | ICD-10-CM | POA: Diagnosis not present

## 2022-07-26 DIAGNOSIS — Z6841 Body Mass Index (BMI) 40.0 and over, adult: Secondary | ICD-10-CM | POA: Diagnosis not present

## 2022-08-01 ENCOUNTER — Encounter: Payer: Self-pay | Admitting: Family Medicine

## 2022-08-01 ENCOUNTER — Ambulatory Visit (INDEPENDENT_AMBULATORY_CARE_PROVIDER_SITE_OTHER): Payer: Medicare PPO | Admitting: Family Medicine

## 2022-08-01 DIAGNOSIS — I10 Essential (primary) hypertension: Secondary | ICD-10-CM
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
## 2022-08-01 NOTE — Assessment & Plan Note (Addendum)
Refer Ortho  for management, c/o neck and right shoulder pain, has had C spine surgery in the past  Has anti inflammatories and muscle relaxant she is taking already

## 2022-08-01 NOTE — Patient Instructions (Signed)
F/U in 4 months, call if you need me sooner  Call Ortho office  for appt please, nurse opls give her the numbner  Paperwork for bariatric surgery will be faxed in , you are a good candidate  Thanks for choosing [facility], we consider it a privelige to serve you.

## 2022-08-02 ENCOUNTER — Other Ambulatory Visit: Payer: Self-pay | Admitting: *Deleted

## 2022-08-02 DIAGNOSIS — M545 Low back pain, unspecified: Secondary | ICD-10-CM | POA: Diagnosis not present

## 2022-08-02 DIAGNOSIS — S8001XA Contusion of right knee, initial encounter: Secondary | ICD-10-CM | POA: Diagnosis not present

## 2022-08-02 DIAGNOSIS — Z79899 Other long term (current) drug therapy: Secondary | ICD-10-CM

## 2022-08-02 DIAGNOSIS — M542 Cervicalgia: Secondary | ICD-10-CM | POA: Diagnosis not present

## 2022-08-02 LAB — CBC WITH DIFFERENTIAL/PLATELET
Absolute Monocytes: 574 cells/uL (ref 200–950)
Hemoglobin: 10.9 g/dL — ABNORMAL LOW (ref 11.7–15.5)
Monocytes Relative: 6.6 %
Platelets: 388 10*3/uL (ref 140–400)
RBC: 4.68 10*6/uL (ref 3.80–5.10)
Total Lymphocyte: 32.8 %

## 2022-08-02 NOTE — Assessment & Plan Note (Addendum)
  Patient re-educated about  the importance of commitment to a  minimum of 150 minutes of exercise per week as able.  The importance of healthy food choices with portion control discussed, as well as eating regularly and within a 12 hour window most days. The need to choose "clean , green" food 50 to 75% of the time is discussed, as well as to make water the primary drink and set a goal of 64 ounces water daily.       08/01/2022    8:40 AM 05/07/2022    9:17 AM 04/22/2022    9:09 AM  Weight /BMI  Weight 343 lb 1.3 oz 336 lb 336 lb 0.6 oz  Height 5\' 1"  (1.549 m) 5\' 1"  (1.549 m) 5\' 1"  (1.549 m)  BMI 64.82 kg/m2 63.49 kg/m2 63.49 kg/m2    Paperwork to be completed supporting weight loss surgery

## 2022-08-02 NOTE — Assessment & Plan Note (Signed)
DASH diet and commitment to daily physical activity for a minimum of 30 minutes discussed and encouraged, as a part of hypertension management. The importance of attaining a healthy weight is also discussed.     08/01/2022    8:40 AM 05/07/2022    9:17 AM 04/22/2022    9:09 AM 04/02/2022    2:35 PM 04/02/2022    2:29 PM 03/22/2022    9:29 AM 03/22/2022    8:42 AM  BP/Weight  Systolic BP 133 131 138 127 151 135 157  Diastolic BP 85 80 86 85 75 69 74  Wt. (Lbs) 343.08 336 336.04  334.5  336  BMI 64.82 kg/m2 63.49 kg/m2 63.49 kg/m2  63.2 kg/m2  63.49 kg/m2     Controlled, no change in medication

## 2022-08-03 LAB — CBC WITH DIFFERENTIAL/PLATELET
Basophils Absolute: 44 cells/uL (ref 0–200)
Basophils Relative: 0.5 %
Eosinophils Absolute: 174 cells/uL (ref 15–500)
Eosinophils Relative: 2 %
HCT: 36 % (ref 35.0–45.0)
Lymphs Abs: 2854 cells/uL (ref 850–3900)
MCH: 23.3 pg — ABNORMAL LOW (ref 27.0–33.0)
MCHC: 30.3 g/dL — ABNORMAL LOW (ref 32.0–36.0)
MCV: 76.9 fL — ABNORMAL LOW (ref 80.0–100.0)
MPV: 10.1 fL (ref 7.5–12.5)
Neutro Abs: 5055 cells/uL (ref 1500–7800)
Neutrophils Relative %: 58.1 %
RDW: 14.1 % (ref 11.0–15.0)
WBC: 8.7 10*3/uL (ref 3.8–10.8)

## 2022-08-03 LAB — COMPLETE METABOLIC PANEL WITH GFR
AG Ratio: 1 (calc) (ref 1.0–2.5)
ALT: 11 U/L (ref 6–29)
AST: 13 U/L (ref 10–35)
Albumin: 3.8 g/dL (ref 3.6–5.1)
Alkaline phosphatase (APISO): 89 U/L (ref 37–153)
BUN: 24 mg/dL (ref 7–25)
CO2: 26 mmol/L (ref 20–32)
Calcium: 9.3 mg/dL (ref 8.6–10.4)
Chloride: 103 mmol/L (ref 98–110)
Creat: 0.98 mg/dL (ref 0.50–1.03)
Globulin: 3.8 g/dL (calc) — ABNORMAL HIGH (ref 1.9–3.7)
Glucose, Bld: 95 mg/dL (ref 65–99)
Potassium: 4.7 mmol/L (ref 3.5–5.3)
Sodium: 137 mmol/L (ref 135–146)
Total Bilirubin: 0.4 mg/dL (ref 0.2–1.2)
Total Protein: 7.6 g/dL (ref 6.1–8.1)
eGFR: 66 mL/min/{1.73_m2} (ref >=60)

## 2022-08-04 NOTE — Progress Notes (Signed)
CBC and CMP are stable.  Patient continues to be anemic with hemoglobin of 10.9.  Patient should take multivitamin with iron.  Please forward results to her PCP.

## 2022-08-05 ENCOUNTER — Other Ambulatory Visit: Payer: Self-pay | Admitting: Family Medicine

## 2022-08-05 ENCOUNTER — Encounter (INDEPENDENT_AMBULATORY_CARE_PROVIDER_SITE_OTHER): Payer: Self-pay | Admitting: *Deleted

## 2022-08-05 DIAGNOSIS — Z1211 Encounter for screening for malignant neoplasm of colon: Secondary | ICD-10-CM

## 2022-08-05 DIAGNOSIS — D649 Anemia, unspecified: Secondary | ICD-10-CM

## 2022-08-09 ENCOUNTER — Ambulatory Visit: Payer: Medicare PPO | Admitting: Family Medicine

## 2022-08-26 ENCOUNTER — Other Ambulatory Visit: Payer: Self-pay | Admitting: Rheumatology

## 2022-08-26 DIAGNOSIS — M0609 Rheumatoid arthritis without rheumatoid factor, multiple sites: Secondary | ICD-10-CM

## 2022-08-26 MED ORDER — RINVOQ 15 MG PO TB24: 15.0000 mg | ORAL_TABLET | Freq: Every day | ORAL | 0 refills | Status: DC

## 2022-08-26 NOTE — Telephone Encounter (Signed)
Last Fill: 10/17/2021  Labs: 08/02/2022 CBC and CMP are stable.   TB Gold: 03/08/2022 Neg    Next Visit: 09/09/2022  Last Visit: 03/22/2022  [id number] arthritis of multiple sites with negative rheumatoid factor   Current Dose per office note 03/22/2022: Rinvoq 15 mg 1 tablet by mouth daily   Okay to refill Rinvoq?

## 2022-08-26 NOTE — Telephone Encounter (Signed)
Patient called requesting prescription refill of Rinvoq.  Patient states she was told that a new prescription is needed before it can be refilled.  Patient states she is out of medication.
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
## 2022-08-27 ENCOUNTER — Telehealth: Payer: Self-pay | Admitting: Family Medicine

## 2022-08-27 NOTE — Telephone Encounter (Signed)
Medical clearance forms [facility]) faxed by CMA   LVM to let patient know forms were ready for pick up at front desk.

## 2022-08-29 ENCOUNTER — Other Ambulatory Visit: Payer: Self-pay | Admitting: Physician Assistant

## 2022-08-29 DIAGNOSIS — M0609 Rheumatoid arthritis without rheumatoid factor, multiple sites: Secondary | ICD-10-CM

## 2022-08-29 NOTE — Telephone Encounter (Signed)
Last Fill: 04/15/2022 (MTX), 02/20/2021 (Folic Acid)  Labs: 08/02/2022 CBC and CMP are stable.   Next Visit: 09/09/2022  Last Visit: 03/22/2022  DX: Rheumatoid arthritis of multiple sites with negative rheumatoid factor   Current Dose per office note 03/22/2022: MTX 1.0 ml sq injections once weekly, and folic acid 2 mg po daily.   Okay to refill Methotrexate and Folic Acid?

## 2022-08-30 DIAGNOSIS — Z6841 Body Mass Index (BMI) 40.0 and over, adult: Secondary | ICD-10-CM | POA: Diagnosis not present

## 2022-09-02 ENCOUNTER — Other Ambulatory Visit (HOSPITAL_COMMUNITY): Payer: Self-pay | Admitting: Surgery

## 2022-09-04 ENCOUNTER — Ambulatory Visit (INDEPENDENT_AMBULATORY_CARE_PROVIDER_SITE_OTHER): Payer: Medicare PPO

## 2022-09-04 VITALS — Ht 61.0 in | Wt 348.0 lb

## 2022-09-04 DIAGNOSIS — Z1231 Encounter for screening mammogram for malignant neoplasm of breast: Secondary | ICD-10-CM

## 2022-09-04 DIAGNOSIS — Z0000 Encounter for general adult medical examination without abnormal findings: Secondary | ICD-10-CM | POA: Diagnosis not present

## 2022-09-04 NOTE — Addendum Note (Signed)
Addended by: Recardo Evangelist A on: 09/04/2022 11:48 AM   Modules accepted: Orders

## 2022-09-04 NOTE — Patient Instructions (Signed)
[name] , Thank you for taking time to come for your Medicare Wellness Visit. I appreciate your ongoing commitment to your health goals. Please review the following plan we discussed and let me know if I can assist you in the future.   These are the goals we discussed:  Goals      Exercise 3x per week (30 min per time)     Patient Stated     Take more time for myself and be with friends more      Patient Stated     Lose some weight and get knees ready for trip in August     Patient Stated     Patient's goal is to lose weight and have bariatric surgery        This is a list of the screening recommended for you and due dates:  Health Maintenance  Topic Date Due   COVID-19 Vaccine (6 - 2023-24 season) 07/01/2022   Yearly kidney health urinalysis for diabetes  01/09/2023*   Flu Shot  10/17/2022   Hemoglobin A1C  11/12/2022   Colon Cancer Screening  03/04/2023   Yearly kidney function blood test for diabetes  08/02/2023   Medicare Annual Wellness Visit  09/04/2023   Mammogram  10/19/2023   Pap Smear  05/18/2024   DTaP/Tdap/Td vaccine (3 - Td or Tdap) 11/29/2029   Hepatitis C Screening  Completed   HIV Screening  Completed   Zoster (Shingles) Vaccine  Completed   HPV Vaccine  Aged Out   Complete foot exam   Discontinued   Eye exam for diabetics  Discontinued  *Topic was postponed. The date shown is not the original due date.    Advanced directives: Advance directive discussed with you today. Even though you declined this today, please call our office should you change your mind, and we can give you the proper paperwork for you to fill out. Advance care planning is a way to make decisions about medical care that fits your values in case you are ever unable to make these decisions for yourself.  Information on Advanced Care Planning can be found at [facility] Advance Health Care Directives Advance Health Care Directives ([email or web address]   Conditions/risks  identified: A mammogram has been ordered for you. If you haven't heard from them in a week, please call and schedule your appt.  You have an order for:  []   2D Mammogram  [x]   3D Mammogram  []   Bone Density    Please call for appointment:  [facility] [address] [place], [place] [address] [phone]  Schedule your [place] screening mammogram through MyChart!   Log into your MyChart account.  Go to 'Visit' (or 'Appointments' if on mobile App) --> Schedule an Appointment  Under 'Select a Reason for Visit' choose the Mammogram Screening option.  Complete the pre-visit questions and select the time and place that best fits your schedule.   Next appointment:  Follow up in one year for your annual wellness visit. October 01, 2023 at 8am telephone visit  Preventive Care 40-64 Years, Female Preventive care refers to lifestyle choices and visits with your health care provider that can promote health and wellness. What does preventive care include? A yearly physical exam. This is also called an annual well check. Dental exams once or twice a year. Routine eye exams. Ask your health care provider how often you should have your eyes checked. Personal lifestyle choices,  including: Daily care of your teeth and gums. Regular physical activity. Eating a healthy diet. Avoiding tobacco and drug use. Limiting alcohol use. Practicing safe sex. Taking low-dose aspirin daily starting at age 24. Taking vitamin and mineral supplements as recommended by your health care provider. What happens during an annual well check? The services and screenings done by your health care provider during your annual well check will depend on your age, overall health, lifestyle risk factors, and family history of disease. Counseling  Your health care provider may ask you questions about your: Alcohol use. Tobacco use. Drug use. Emotional well-being. Home and  relationship well-being. Sexual activity. Eating habits. Work and work [employer]. Method of birth control. Menstrual cycle. Pregnancy history. Screening  You may have the following tests or measurements: Height, weight, and BMI. Blood pressure. Lipid and cholesterol levels. These may be checked every 5 years, or more frequently if you are over 67 years old. Skin check. Lung cancer screening. You may have this screening every year starting at age 82 if you have a 30-pack-year history of smoking and currently smoke or have quit within the past 15 years. Fecal occult blood test (FOBT) of the stool. You may have this test every year starting at age 50. Flexible sigmoidoscopy or colonoscopy. You may have a sigmoidoscopy every 5 years or a colonoscopy every 10 years starting at age 56. Hepatitis C blood test. Hepatitis B blood test. Sexually transmitted disease (STD) testing. Diabetes screening. This is done by checking your blood sugar (glucose) after you have not eaten for a while (fasting). You may have this done every 1-3 years. Mammogram. This may be done every 1-2 years. Talk to your health care provider about when you should start having regular mammograms. This may depend on whether you have a family history of breast cancer. BRCA-related cancer screening. This may be done if you have a family history of breast, ovarian, tubal, or peritoneal cancers. Pelvic exam and Pap test. This may be done every 3 years starting at age 28. Starting at age 47, this may be done every 5 years if you have a Pap test in combination with an HPV test. Bone density scan. This is done to screen for osteoporosis. You may have this scan if you are at high risk for osteoporosis. Discuss your test results, treatment options, and if necessary, the need for more tests with your health care provider. Vaccines  Your health care provider may recommend certain vaccines, such as: Influenza vaccine. This is recommended  every year. Tetanus, diphtheria, and acellular pertussis (Tdap, Td) vaccine. You may need a Td booster every 10 years. Zoster vaccine. You may need this after age 67. Pneumococcal 13-valent conjugate (PCV13) vaccine. You may need this if you have certain conditions and were not previously vaccinated. Pneumococcal polysaccharide (PPSV23) vaccine. You may need one or two doses if you smoke cigarettes or if you have certain conditions. Talk to your health care provider about which screenings and vaccines you need and how often you need them. This information is not intended to replace advice given to you by your health care provider. Make sure you discuss any questions you have with your health care provider. Document Released: 03/31/2015 Document Revised: 11/22/2015 Document Reviewed: 01/03/2015 Elsevier Interactive Patient Education  2017 [employer].    Fall Prevention in the Home Falls can cause injuries. They can happen to people of all ages. There are many things you can do to make your home safe and to help  prevent falls. What can I do on the outside of my home? Regularly fix the edges of walkways and driveways and fix any cracks. Remove anything that might make you trip as you walk through a door, such as a raised step or threshold. Trim any bushes or trees on the path to your home. Use bright outdoor lighting. Clear any walking paths of anything that might make someone trip, such as rocks or tools. Regularly check to see if handrails are loose or broken. Make sure that both sides of any steps have handrails. Any raised decks and porches should have guardrails on the edges. Have any leaves, snow, or ice cleared regularly. Use sand or salt on walking paths during winter. Clean up any spills in your garage right away. This includes oil or grease spills. What can I do in the bathroom? Use night lights. Install grab bars by the toilet and in the tub and shower. Do not use towel bars as  grab bars. Use non-skid mats or decals in the tub or shower. If you need to sit down in the shower, use a plastic, non-slip stool. Keep the floor dry. Clean up any water that spills on the floor as soon as it happens. Remove soap buildup in the tub or shower regularly. Attach bath mats securely with double-sided non-slip rug tape. Do not have throw rugs and other things on the floor that can make you trip. What can I do in the bedroom? Use night lights. Make sure that you have a light by your bed that is easy to reach. Do not use any sheets or blankets that are too big for your bed. They should not hang down onto the floor. Have a firm chair that has side arms. You can use this for support while you get dressed. Do not have throw rugs and other things on the floor that can make you trip. What can I do in the kitchen? Clean up any spills right away. Avoid walking on wet floors. Keep items that you use a lot in easy-to-reach places. If you need to reach something above you, use a strong step stool that has a grab bar. Keep electrical cords out of the way. Do not use floor polish or wax that makes floors slippery. If you must use wax, use non-skid floor wax. Do not have throw rugs and other things on the floor that can make you trip. What can I do with my stairs? Do not leave any items on the stairs. Make sure that there are handrails on both sides of the stairs and use them. Fix handrails that are broken or loose. Make sure that handrails are as long as the stairways. Check any carpeting to make sure that it is firmly attached to the stairs. Fix any carpet that is loose or worn. Avoid having throw rugs at the top or bottom of the stairs. If you do have throw rugs, attach them to the floor with carpet tape. Make sure that you have a light switch at the top of the stairs and the bottom of the stairs. If you do not have them, ask someone to add them for you. What else can I do to help prevent  falls? Wear shoes that: Do not have high heels. Have rubber bottoms. Are comfortable and fit you well. Are closed at the toe. Do not wear sandals. If you use a stepladder: Make sure that it is fully opened. Do not climb a closed stepladder. Make sure that  both sides of the stepladder are locked into place. Ask someone to hold it for you, if possible. Clearly mark and make sure that you can see: Any grab bars or handrails. First and last steps. Where the edge of each step is. Use tools that help you move around (mobility aids) if they are needed. These include: Canes. Walkers. Scooters. Crutches. Turn on the lights when you go into a dark area. Replace any light bulbs as soon as they burn out. Set up your furniture so you have a clear path. Avoid moving your furniture around. If any of your floors are uneven, fix them. If there are any pets around you, be aware of where they are. Review your medicines with your doctor. Some medicines can make you feel dizzy. This can increase your chance of falling. Ask your doctor what other things that you can do to help prevent falls. This information is not intended to replace advice given to you by your health care provider. Make sure you discuss any questions you have with your health care provider. Document Released: 12/29/2008 Document Revised: 08/10/2015 Document Reviewed: 04/08/2014 Elsevier Interactive Patient Education  2017 [employer].

## 2022-09-04 NOTE — Progress Notes (Signed)
 Subjective:   [name] is a 62 y.o. female who presents for Medicare Annual (Subsequent) preventive examination.  Visit Complete: Virtual  I connected with  [name] on 09/04/22 by a audio enabled telemedicine application and verified that I am speaking with the correct person using two identifiers.  Patient Location: Home  Provider Location: Home Office  I discussed the limitations of evaluation and management by telemedicine. The patient expressed understanding and agreed to proceed.  Patient Medicare AWV questionnaire was completed by the patient on 09/02/2022; I have confirmed that all information answered by patient is correct and no changes since this date.  Review of Systems     Cardiac Risk Factors include: dyslipidemia;hypertension;obesity (BMI >30kg/m2);sedentary lifestyle     Objective:    Today's Vitals   09/04/22 0805  Weight: (!) 348 lb (157.9 kg)  Height: 5\' 1"  (1.549 m)  PainSc: 0-No pain   Body mass index is 65.75 kg/m.     09/04/2022    8:12 AM 04/24/2022    9:34 AM 08/27/2021    9:01 AM 08/02/2020    9:52 AM 01/25/2018    3:35 PM 08/14/2017    6:46 PM 08/14/2017   12:59 PM  Advanced Directives  Does Patient Have a Medical Advance Directive? No No No No No  No  Would patient like information on creating a medical advance directive? No - Patient declined  Yes (ED - Information included in AVS) No - Patient declined   No - Patient declined     Information is confidential and restricted. Go to Review Flowsheets to unlock data.    Current Medications (verified) Outpatient Encounter Medications as of 09/04/2022  Medication Sig   famotidine (PEPCID) 40 MG tablet TAKE 1 TABLET BY MOUTH EVERY DAY   folic acid (FOLVITE) 1 MG tablet TAKE 2 TABLETS BY MOUTH EVERY DAY   furosemide (LASIX) 20 MG tablet Take one tablet by mouth twice daily , as needed, for leg swelling   ibuprofen (ADVIL) 800 MG tablet Take 800 mg by mouth every 6 (six) hours  as needed.   methotrexate 50 MG/2ML injection Inject 0.6 mLs (15 mg total) into the skin once a week.   Methotrexate Sodium (METHOTREXATE, PF,) 50 MG/2ML injection INJECT 1 ML (25 MG TOTAL) INTO THE SKIN ONCE WEEK   montelukast (SINGULAIR) 10 MG tablet TAKE 1 TABLET BY MOUTH EVERYDAY AT BEDTIME   phentermine (ADIPEX-P) 37.5 MG tablet Take 1 tablet (37.5 mg total) by mouth daily before breakfast.   potassium chloride SA (KLOR-CON M) 20 MEQ tablet Take one  tablet twice daily, as needed, on days when you take furosemide for leg swelling   predniSONE (DELTASONE) 10 MG tablet Take 1 tablet (10 mg total) by mouth daily with breakfast.   triamterene-hydrochlorothiazide (MAXZIDE-25) 37.5-25 MG tablet TAKE 1 TABLET BY MOUTH EVERY DAY   TUBERCULIN SYR 1CC/27GX1/2" (B-D TB SYRINGE 1CC/27GX1/2") 27G X 1/2" 1 ML MISC USE 1 SYRINGE ONCE WEEKLY TO INJECT METHOTREXATE   Upadacitinib ER (RINVOQ) 15 MG TB24 Take 1 tablet (15 mg total) by mouth daily.   VITAMIN D PO Take by mouth daily.   No facility-administered encounter medications on file as of 09/04/2022.    Allergies (verified) Aleve [naproxen sodium], Penicillins, Adhesive [tape], and Latex   History: Past Medical History:  Diagnosis Date   Allergy    Annual visit for general adult medical examination with abnormal findings 01/02/2021   Anxiety    Sometimes   Chronic back  pain    Degenerative joint disease    Left TKA in 07/2009-Dr. Landau   Depression 2001   doesn't take any meds   Essential hypertension 11/26/2011   GERD (gastroesophageal reflux disease)    Headache(784.0)    occasionally   Human bite of forearm 11/30/2019   Hypothyroidism    was on Levothyroxine-has been off x 4 months   Joint pain    Joint swelling    Morbid obesity (HCC)    Nocturia    Osteoarthritis of right knee 07/13/2013   Peripheral edema    to right leg;takes Furosemide occasionally and hasnt had one in a month   Rheumatoid arthritis (HCC)    Syncope     with palpitations; evaluated by [facility] in 2008 with normal echo   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 Lovell Sheehan   COLONOSCOPY N/A 03/03/2013   Procedure: COLONOSCOPY;  Surgeon: Malissa Hippo, MD;  Location: [facility];  Service: Endoscopy;  Laterality: N/A;  930   CRANIECTOMY SUBOCCIPITAL W/ CERVICAL LAMINECTOMY / CHIARI  2007   Repair of Arnold-Chiari malformation   EYE SURGERY     bilateral cataract surgery   JOINT REPLACEMENT Left 2011   KNEE ARTHROSCOPY  1995   Dr. Cleophas Dunker    THYROIDECTOMY, PARTIAL  2004   Left lobectomy - large benign nodule   TONSILLECTOMY     TOTAL KNEE ARTHROPLASTY  07/2009   Left knee , Dr. Dion Saucier    TOTAL KNEE ARTHROPLASTY Right 07/13/2013   Procedure: TOTAL KNEE ARTHROPLASTY;  Surgeon: Eulas Post, MD;  Location: [facility] OR;  Service: Orthopedics;  Laterality: Right;   TUBAL LIGATION     Family History  Problem Relation Age of Onset   Diabetes Mother    Depression Mother    Diabetes Father        emphsema    Hyperlipidemia Father    Emphysema Father    Rheum arthritis Sister    Breast cancer Sister    Cancer Sister    Healthy Daughter    Healthy Daughter    Healthy Daughter    Colon cancer Neg Hx    Social History   Socioeconomic History   Marital status: Significant Other    Spouse name: Not on file   Number of children: 3   Years of education: 15   Highest education level: Some college, no degree  Occupational History   Occupation: Employed seeking disabilty 2012  Tobacco Use   Smoking status: Never    Passive exposure: Never   Smokeless tobacco: Never  Vaping Use   Vaping Use: Never used  Substance and Sexual Activity   Alcohol use: Yes     Alcohol/week: 10.0 standard drinks of alcohol    Types: 10 Glasses of wine per week    Comment: Once a day, helps me sleep   Drug use: No   Sexual activity: Yes    Birth control/protection: Condom, Surgical  Other Topics Concern   Not on file  Social History Narrative   Is in a new relationship    Social Determinants of Health   Financial Resource Strain: Medium Risk (09/04/2022)  Overall Financial Resource Strain (CARDIA)    Difficulty of Paying Living Expenses: Somewhat hard  Food Insecurity: No Food Insecurity (09/04/2022)   Hunger Vital Sign    Worried About Running Out of Food in the Last Year: Never true    Ran Out of Food in the Last Year: Never true  Transportation Needs: No Transportation Needs (09/04/2022)   PRAPARE - Administrator, Civil Service (Medical): No    Lack of Transportation (Non-Medical): No  Physical Activity: Insufficiently Active (09/04/2022)   Exercise Vital Sign    Days of Exercise per Week: 1 day    Minutes of Exercise per Session: 10 min  Stress: No Stress Concern Present (09/04/2022)   [employer] of Occupational Health - Occupational Stress Questionnaire    Feeling of Stress : Not at all  Social Connections: Moderately Integrated (09/04/2022)   Social Connection and Isolation Panel [NHANES]    Frequency of Communication with Friends and Family: More than three times a week    Frequency of Social Gatherings with Friends and Family: Once a week    Attends Religious Services: More than 4 times per year    Active Member of [employer] or Organizations: Yes    Attends Engineer, structural: More than 4 times per year    Marital Status: Divorced    Tobacco Counseling Counseling given: Yes   Clinical Intake:  Pre-visit preparation completed: Yes  Pain : No/denies pain Pain Score: 0-No pain     BMI - recorded: 65.75 Nutritional Status: BMI > 30  Obese Nutritional Risks: None Diabetes: No  How often do you need to  have someone help you when you read instructions, pamphlets, or other written materials from your doctor or pharmacy?: 1 - Never  Interpreter Needed?: No  Information entered by ::  Kurt Azimi CMA   Activities of Daily Living    09/04/2022    8:13 AM 09/02/2022    7:24 PM  In your present state of health, do you have any difficulty performing the following activities:  Hearing? 0 0  Vision? 0 0  Difficulty concentrating or making decisions? 0 0  Walking or climbing stairs? 1 1  Dressing or bathing? 0 0  Doing errands, shopping? 0 0  Preparing Food and eating ? N N  Using the Toilet? N N  In the past six months, have you accidently leaked urine? Y Y  Do you have problems with loss of bowel control? N N  Managing your Medications? N N  Managing your Finances? N N  Housekeeping or managing your Housekeeping? N N    Patient Care Team: Kerri Perches, MD as PCP - [facility], Dorothe Pea, MD as PCP - Cardiology (Cardiology) Rothbart, Gerrit Friends, MD (Inactive) (Cardiology) Teryl Lucy, MD (Orthopedic Surgery) Hershal Coria, PsyD (Psychology)  Indicate any recent Medical Services you may have received from other than [facility] providers in the past year (date may be approximate).     Assessment:   This is a routine wellness examination for Josslyn.  Hearing/Vision screen Hearing Screening - Comments:: Patient denies any hearing difficulties.   Vision Screening - Comments:: Wears rx glasses - up to date with routine eye exams with  [facility]  Dietary issues and exercise activities discussed:     Goals Addressed             This Visit's Progress    Patient Stated       Patient's goal is to  lose weight and have bariatric surgery       Depression Screen    09/04/2022    8:08 AM 08/01/2022    8:41 AM 05/07/2022    9:18 AM 04/22/2022    9:10 AM 02/28/2022    9:58 AM 01/15/2022   10:46 AM 01/04/2022    4:09 PM  PHQ 2/9 Scores  PHQ - 2 Score 0 0 0 0 0  0 0  PHQ- 9 Score 0 3 2 0       Fall Risk    09/04/2022    8:12 AM 09/02/2022    7:24 PM 08/01/2022    8:41 AM 05/07/2022    9:18 AM 04/22/2022    9:10 AM  Fall Risk   Falls in the past year? 1 1 1 1 1   Number falls in past yr: 1 1 1 1 1   Injury with Fall? 0 0 0 0 0  Risk for fall due to : Impaired balance/gait;Impaired mobility;History of fall(s)  History of fall(s);Impaired balance/gait No Fall Risks   Follow up Falls prevention discussed;Education provided  Falls evaluation completed Falls evaluation completed     MEDICARE RISK AT HOME:   TIMED UP AND GO:  Was the test performed?  No    Cognitive Function:        09/04/2022    8:13 AM 08/27/2021    9:05 AM 01/01/2018    3:30 PM  6CIT Screen  What Year? 0 points 0 points 0 points  What month? 0 points 0 points 0 points  What time? 0 points 0 points 0 points  Count back from 20 0 points 0 points 0 points  Months in reverse 0 points 0 points 0 points  Repeat phrase 0 points 0 points 0 points  Total Score 0 points 0 points 0 points    Immunizations Immunization History  Administered Date(s) Administered   Covid-19, Mrna,Vaccine(Spikevax)31yrs and older 05/06/2022   Influenza Split 01/16/2012   Influenza Whole 02/23/2007, 12/07/2008   Influenza,inj,Quad PF,6+ Mos 12/07/2012, 02/15/2014, 11/28/2014, 01/09/2016, 04/07/2017, 12/18/2017, 02/16/2019, 12/09/2019, 01/02/2021, 11/16/2021   Moderna Sars-Covid-2 Vaccination 06/09/2019, 07/07/2019, 03/06/2020, 10/10/2020, 05/06/2022   Pneumococcal Polysaccharide-23 07/31/2015   Td 10/23/2005   Tdap 11/30/2019   Zoster Recombinat (Shingrix) 02/02/2021, 04/06/2021    TDAP status: Up to date  Flu Vaccine status: Up to date  Pneumococcal vaccine status: Not age appropriate for this patient  Covid-19 vaccine status: Information provided on how to obtain vaccines.   Qualifies for Shingles Vaccine? Yes   Zostavax completed Yes   Shingrix Completed?: Yes  Screening  Tests Health Maintenance  Topic Date Due   COVID-19 Vaccine (6 - 2023-24 season) 07/01/2022   Diabetic kidney evaluation - Urine ACR  01/09/2023 (Originally 07/30/2016)   INFLUENZA VACCINE  10/17/2022   HEMOGLOBIN A1C  11/12/2022   Colonoscopy  03/04/2023   Diabetic kidney evaluation - eGFR measurement  08/02/2023   Medicare Annual Wellness (AWV)  09/04/2023   MAMMOGRAM  10/19/2023   PAP SMEAR-Modifier  05/18/2024   DTaP/Tdap/Td (3 - Td or Tdap) 11/29/2029   Hepatitis C Screening  Completed   HIV Screening  Completed   Zoster Vaccines- Shingrix  Completed   HPV VACCINES  Aged Out   FOOT EXAM  Discontinued   OPHTHALMOLOGY EXAM  Discontinued    Health Maintenance  Health Maintenance Due  Topic Date Due   COVID-19 Vaccine (6 - 2023-24 season) 07/01/2022    Colorectal cancer screening: Type of screening: Colonoscopy. Completed 03/03/2013. Repeat  every 10 years  Mammogram status: Ordered 09/04/2022. Pt provided with contact info and advised to call to schedule appt.   Bone Density status:  Not age appropriate for this patient  Lung Cancer Screening: (Low Dose CT Chest recommended if Age 67-80 years, 20 pack-year currently smoking OR have quit w/in 15years.) does not qualify.   Additional Screening:  Hepatitis C Screening: does qualify; Completed 11/30/2018  Vision Screening: Recommended annual ophthalmology exams for early detection of glaucoma and other disorders of the eye. Is the patient up to date with their annual eye exam?  Yes  Who is the provider or what is the name of the office in which the patient attends annual eye exams? [facility] If pt is not established with a provider, would they like to be referred to a provider to establish care? No .   Dental Screening: Recommended annual dental exams for proper oral hygiene  Diabetic Foot Exam: no longer needed. This has been discontinued in patients health maintenance  Community Resource Referral / Chronic Care  Management: CRR required this visit?  No   CCM required this visit?  No     Plan:     I have personally reviewed and noted the following in the patient's chart:   Medical and social history Use of alcohol, tobacco or illicit drugs  Current medications and supplements including opioid prescriptions. Patient is not currently taking opioid prescriptions. Functional ability and status Nutritional status Physical activity Advanced directives List of other physicians Hospitalizations, surgeries, and ER visits in previous 12 months Vitals Screenings to include cognitive, depression, and falls Referrals and appointments  In addition, I have reviewed and discussed with patient certain preventive protocols, quality metrics, and best practice recommendations. A written personalized care plan for preventive services as well as general preventive health recommendations were provided to patient.    Because this visit was a virtual/telehealth visit,  certain criteria was not obtained, such a blood pressure, CBG if patient is a diabetic, and timed up and go.   Jordan Hawks Edom Schmuhl, CMA   09/04/2022   After Visit Summary: (MyChart) Due to this being a telephonic visit, the after visit summary with patients personalized plan was offered to patient via MyChart   Nurse Notes: Mammogram ordered

## 2022-09-06 ENCOUNTER — Encounter (HOSPITAL_COMMUNITY)
Admission: RE | Admit: 2022-09-06 | Discharge: 2022-09-06 | Disposition: A | Discharge status: Home / Self Care | Payer: Medicare PPO | Source: Ambulatory Visit | Attending: Surgery | Admitting: Surgery

## 2022-09-06 ENCOUNTER — Ambulatory Visit (HOSPITAL_COMMUNITY)
Admission: RE | Admit: 2022-09-06 | Discharge: 2022-09-06 | Disposition: A | Discharge status: Home / Self Care | Payer: Medicare PPO | Source: Ambulatory Visit | Attending: Surgery | Admitting: Surgery

## 2022-09-06 DIAGNOSIS — Z01818 Encounter for other preprocedural examination: Secondary | ICD-10-CM | POA: Diagnosis not present

## 2022-09-06 DIAGNOSIS — Z6841 Body Mass Index (BMI) 40.0 and over, adult: Secondary | ICD-10-CM | POA: Diagnosis not present

## 2022-09-09 ENCOUNTER — Ambulatory Visit: Payer: Medicare PPO | Admitting: Rheumatology

## 2022-09-09 DIAGNOSIS — E890 Postprocedural hypothyroidism: Secondary | ICD-10-CM

## 2022-09-09 DIAGNOSIS — I10 Essential (primary) hypertension: Secondary | ICD-10-CM

## 2022-09-09 DIAGNOSIS — M19041 Primary osteoarthritis, right hand: Secondary | ICD-10-CM

## 2022-09-09 DIAGNOSIS — Z96653 Presence of artificial knee joint, bilateral: Secondary | ICD-10-CM

## 2022-09-09 DIAGNOSIS — Z79899 Other long term (current) drug therapy: Secondary | ICD-10-CM

## 2022-09-09 DIAGNOSIS — R7303 Prediabetes: Secondary | ICD-10-CM

## 2022-09-09 DIAGNOSIS — Z8261 Family history of arthritis: Secondary | ICD-10-CM

## 2022-09-09 DIAGNOSIS — Z8719 Personal history of other diseases of the digestive system: Secondary | ICD-10-CM

## 2022-09-09 DIAGNOSIS — E559 Vitamin D deficiency, unspecified: Secondary | ICD-10-CM

## 2022-09-09 DIAGNOSIS — M0609 Rheumatoid arthritis without rheumatoid factor, multiple sites: Secondary | ICD-10-CM

## 2022-09-09 DIAGNOSIS — M19072 Primary osteoarthritis, left ankle and foot: Secondary | ICD-10-CM

## 2022-09-09 DIAGNOSIS — M5136 Other intervertebral disc degeneration, lumbar region: Secondary | ICD-10-CM

## 2022-09-09 DIAGNOSIS — Z8659 Personal history of other mental and behavioral disorders: Secondary | ICD-10-CM

## 2022-09-09 DIAGNOSIS — M5030 Other cervical disc degeneration, unspecified cervical region: Secondary | ICD-10-CM

## 2022-09-09 DIAGNOSIS — I5032 Chronic diastolic (congestive) heart failure: Secondary | ICD-10-CM

## 2022-09-09 DIAGNOSIS — Z8669 Personal history of other diseases of the nervous system and sense organs: Secondary | ICD-10-CM

## 2022-09-17 ENCOUNTER — Ambulatory Visit (INDEPENDENT_AMBULATORY_CARE_PROVIDER_SITE_OTHER): Payer: Medicare PPO | Admitting: Gastroenterology

## 2022-09-17 DIAGNOSIS — Z6841 Body Mass Index (BMI) 40.0 and over, adult: Secondary | ICD-10-CM | POA: Diagnosis not present

## 2022-09-18 ENCOUNTER — Encounter: Payer: Self-pay | Admitting: Gastroenterology

## 2022-09-18 ENCOUNTER — Ambulatory Visit: Payer: Medicare PPO | Admitting: Gastroenterology

## 2022-09-18 VITALS — BP 135/84 | HR 68 | Temp 98.0°F | Ht 61.0 in | Wt 350.0 lb

## 2022-09-18 DIAGNOSIS — D649 Anemia, unspecified: Secondary | ICD-10-CM | POA: Diagnosis not present

## 2022-09-18 NOTE — Progress Notes (Addendum)
GI Office Note    Referring Provider: Kerri Perches, MD Primary Care Physician:  Kerri Perches, MD  Primary Gastroenterologist: Dr. Levon Hedger  Chief Complaint   Chief Complaint  Patient presents with   Anemia    History of Present Illness   [name] is a 62 y.o. female presenting today at the request of Dr. Lodema Hong for colonoscopy, persistent anemia.  Saw Doylene Bode, NP January 2024 for GERD and weight loss.  Reflux for 6 to 7 months at that time, started on Pepcid 40 mg daily by PCP.  Followed by weight loss clinic, previously on Zepbound, then phentermine as shot was no longer covered.  Lost 40 pounds while taking these medications.  Plans to start on pantoprazole 40 mg daily back in January but potential issues with high dose methotrexate and PPI therefore H2 blocker continued.  Last colonoscopy: December 2014, external hemorrhoids. H.pylori breath test negative in 2022.  Mild anemia noted back in December 2023 with a hemoglobin of 10.3.  Hemoglobin stable at 10.9 back in May.  MCV 76.9.  B12 greater than 2000, 8 months ago.  No recent iron labs, in 2022 her iron was 35.  Uses ibuprofen occasionally, about 3 times per month. She is considering bariatric surgery in about 5 months. Currently states reflux symptoms well controlled. No dysphagia. BM regular. No melena, brbpr. No abdominal pain. She has been on methotrexate and Rinvoq for 2 years.     Wt Readings from Last 10 Encounters:  09/24/22 (!) 351 lb (159.2 kg)  09/18/22 (!) 350 lb (158.8 kg)  09/04/22 (!) 348 lb (157.9 kg)  08/01/22 (!) 343 lb 1.3 oz (155.6 kg)  05/07/22 (!) 336 lb (152.4 kg)  04/22/22 (!) 336 lb 0.6 oz (152.4 kg)  04/02/22 (!) 334 lb 8 oz (151.7 kg)  03/22/22 (!) 336 lb (152.4 kg)  02/28/22 (!) 329 lb 1.9 oz (149.3 kg)  01/15/22 (!) 322 lb 0.6 oz (146.1 kg)     Medications   Current Outpatient Medications  Medication Sig Dispense Refill   acyclovir (ZOVIRAX)  400 MG tablet Take 400 mg by mouth 3 (three) times daily.     famotidine (PEPCID) 40 MG tablet TAKE 1 TABLET BY MOUTH EVERY DAY 90 tablet 1   folic acid (FOLVITE) 1 MG tablet TAKE 2 TABLETS BY MOUTH EVERY DAY 180 tablet 3   ibuprofen (ADVIL) 800 MG tablet Take 800 mg by mouth every 6 (six) hours as needed.     methotrexate 50 MG/2ML injection Inject 0.6 mLs (15 mg total) into the skin once a week. 8 mL 0   montelukast (SINGULAIR) 10 MG tablet TAKE 1 TABLET BY MOUTH EVERYDAY AT BEDTIME 90 tablet 1   phentermine (ADIPEX-P) 37.5 MG tablet Take 1 tablet (37.5 mg total) by mouth daily before breakfast. 90 tablet 0   potassium chloride SA (KLOR-CON M) 20 MEQ tablet Take one  tablet twice daily, as needed, on days when you take furosemide for leg swelling 20 tablet 0   tiZANidine (ZANAFLEX) 2 MG tablet SMARTSIG:2 Tablet(s) By Mouth Every 6-8 Hours PRN     triamterene-hydrochlorothiazide (MAXZIDE-25) 37.5-25 MG tablet TAKE 1 TABLET BY MOUTH EVERY DAY 90 tablet 1   TUBERCULIN SYR 1CC/27GX1/2" (B-D TB SYRINGE 1CC/27GX1/2") 27G X 1/2" 1 ML MISC USE 1 SYRINGE ONCE WEEKLY TO INJECT METHOTREXATE 12 each 3   Upadacitinib ER (RINVOQ) 15 MG TB24 Take 1 tablet (15 mg total) by mouth daily. 90 tablet 0  VITAMIN D PO Take by mouth daily.     No current facility-administered medications for this visit.    Allergies   Allergies as of 09/18/2022 - Review Complete 09/18/2022  Allergen Reaction Noted   Aleve [naproxen sodium] Shortness Of Breath 12/18/2012   Penicillins Swelling 12/18/2012   Adhesive [tape]  07/05/2013   Latex Rash 02/22/2011     Past Medical History   Past Medical History:  Diagnosis Date   Allergy    Annual visit for general adult medical examination with abnormal findings 01/02/2021   Anxiety    Sometimes   Chronic back pain    Degenerative joint disease    Left TKA in 07/2009-Dr. Landau   Depression 2001   doesn't take any meds   Essential hypertension 11/26/2011   GERD  (gastroesophageal reflux disease)    Headache(784.0)    occasionally   Human bite of forearm 11/30/2019   Hypothyroidism    was on Levothyroxine-has been off x 4 months   Joint pain    Joint swelling    Morbid obesity (HCC)    Nocturia    Osteoarthritis of right knee 07/13/2013   Peripheral edema    to right leg;takes Furosemide occasionally and hasnt had one in a month   Rheumatoid arthritis (HCC)    Syncope    with palpitations; evaluated by [facility] in 2008 with normal echo   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 Lovell Sheehan   COLONOSCOPY N/A 03/03/2013   Procedure: COLONOSCOPY;  Surgeon: Malissa Hippo, MD;  Location: [facility];  Service: Endoscopy;  Laterality: N/A;  930   CRANIECTOMY SUBOCCIPITAL W/ CERVICAL LAMINECTOMY / CHIARI  2007   Repair of Arnold-Chiari malformation   EYE SURGERY     bilateral cataract surgery   JOINT REPLACEMENT Left 2011   KNEE ARTHROSCOPY  1995   Dr. Cleophas Dunker    THYROIDECTOMY, PARTIAL  2004   Left lobectomy - large benign nodule   TONSILLECTOMY     TOTAL KNEE ARTHROPLASTY  07/2009   Left knee , Dr. Dion Saucier    TOTAL KNEE ARTHROPLASTY Right 07/13/2013   Procedure: TOTAL KNEE ARTHROPLASTY;  Surgeon: Eulas Post, MD;  Location: [facility] OR;  Service: Orthopedics;  Laterality: Right;   TUBAL LIGATION      Past Family History   Family History  Problem Relation Age of Onset   Diabetes Mother    Depression Mother    Diabetes Father        emphsema    Hyperlipidemia Father    Emphysema Father    Rheum arthritis Sister    Breast cancer Sister    Cancer Sister    Healthy Daughter    Healthy Daughter    Healthy Daughter    Colon  cancer Neg Hx     Past Social History   Social History   Socioeconomic History   Marital status: Significant Other    Spouse name: Not on file   Number of children: 3   Years of education: 15   Highest education level: Some college, no degree  Occupational History   Occupation: Employed Engineer, petroleum  2012  Tobacco Use   Smoking status: Never    Passive exposure: Never   Smokeless tobacco: Never  Vaping Use   Vaping Use: Never used  Substance and Sexual Activity   Alcohol use: Yes    Alcohol/week: 10.0 standard drinks of alcohol    Types: 10 Glasses of wine per week    Comment: Once a day, helps me sleep   Drug use: No   Sexual activity: Yes    Birth control/protection: Condom, Surgical  Other Topics Concern   Not on file  Social History Narrative   Is in a new relationship    Social Determinants of Health   Financial Resource Strain: Medium Risk (09/04/2022)   Overall Financial Resource Strain (CARDIA)    Difficulty of Paying Living Expenses: Somewhat hard  Food Insecurity: No Food Insecurity (09/04/2022)   Hunger Vital Sign    Worried About Running Out of Food in the Last Year: Never true    Ran Out of Food in the Last Year: Never true  Transportation Needs: No Transportation Needs (09/04/2022)   PRAPARE - Administrator, Civil Service (Medical): No    Lack of Transportation (Non-Medical): No  Physical Activity: Insufficiently Active (09/04/2022)   Exercise Vital Sign    Days of Exercise per Week: 1 day    Minutes of Exercise per Session: 10 min  Stress: No Stress Concern Present (09/04/2022)   [employer] of Occupational Health - Occupational Stress Questionnaire    Feeling of Stress : Not at all  Social Connections: Moderately Integrated (09/04/2022)   Social Connection and Isolation Panel [NHANES]    Frequency of Communication with Friends and Family: More than three times a week    Frequency of Social Gatherings with Friends and Family:  Once a week    Attends Religious Services: More than 4 times per year    Active Member of [employer] or Organizations: Yes    Attends Engineer, structural: More than 4 times per year    Marital Status: Divorced  Intimate Partner Violence: Not At Risk (09/04/2022)   Humiliation, Afraid, Rape, and Kick questionnaire    Fear of Current or Ex-Partner: No    Emotionally Abused: No    Physically Abused: No    Sexually Abused: No    Review of Systems   General: Negative for anorexia, weight loss, fever, chills, fatigue, weakness. ENT: Negative for hoarseness, difficulty swallowing , nasal congestion. CV: Negative for chest pain, angina, palpitations, dyspnea on exertion, peripheral edema.  Respiratory: Negative for dyspnea at rest, dyspnea on exertion, cough, sputum, wheezing.  GI: See history of present illness. GU:  Negative for dysuria, hematuria, urinary incontinence, urinary frequency, nocturnal urination.  Endo: Negative for unusual weight change.     Physical Exam   BP 135/84 (BP Location: Right Arm, Patient Position: Sitting, Cuff Size: Large) Comment (Cuff Size): thigh cuff  Pulse 68   Temp 98 F (36.7 C) (Oral)   Ht 5\' 1"  (1.549 m)   Wt (!) 350 lb (158.8 kg)   SpO2 95%   BMI 66.13 kg/m    General: Well-nourished, well-developed in no acute distress.  Eyes: No icterus. Mouth: Oropharyngeal mucosa moist and pink  Lungs: Clear to auscultation bilaterally.  Heart: Regular rate and rhythm, no murmurs rubs or gallops.  Abdomen: Bowel sounds are normal, nontender, nondistended, no hepatosplenomegaly or masses,  no abdominal bruits or hernia , no rebound or guarding.  Rectal: not performed  Extremities: No lower  extremity edema. No clubbing or deformities. Neuro: Alert and oriented x 4   Skin: Warm and dry, no jaundice.   Psych: Alert and cooperative, normal mood and affect.  Labs   Lab Results  Component Value Date   NA 137 08/02/2022   CL 103 08/02/2022   K 4.7  08/02/2022   CO2 26 08/02/2022   BUN 24 08/02/2022   CREATININE 0.98 08/02/2022   EGFR 66 08/02/2022   CALCIUM 9.3 08/02/2022   PHOS 4.0 07/03/2011   ALBUMIN 4.4 01/15/2022   GLUCOSE 95 08/02/2022   Lab Results  Component Value Date   ALT 11 08/02/2022   AST 13 08/02/2022   ALKPHOS 91 01/15/2022   BILITOT 0.4 08/02/2022   Lab Results  Component Value Date   WBC 8.7 08/02/2022   HGB 10.9 (L) 08/02/2022   HCT 36.0 08/02/2022   MCV 76.9 (L) 08/02/2022   PLT 388 08/02/2022    No results found for: "FOLATE" Lab Results  Component Value Date   VITAMINB12 >2000 (H) 01/15/2022    Imaging Studies   DG Chest 2 View  Result Date: 09/10/2022 CLINICAL DATA:  Morbid obesity.  Preop for bariatric surgery. EXAM: CHEST - 2 VIEW COMPARISON:  Radiograph 11/13/2020 FINDINGS: The cardiomediastinal contours are normal. The lungs are clear. Pulmonary vasculature is normal. No consolidation, pleural effusion, or pneumothorax. Diffuse thoracic spondylosis with spurring. No acute osseous abnormalities are seen. IMPRESSION: No acute chest findings. Electronically Signed   By: Narda Rutherford M.D.   On: 09/10/2022 10:49    Assessment   *Microcytic anemia   Plans for upcoming bariatric surgery. She has chronic microcytosis. Anemia noted 02/2022. She is on methotrexate and Rinvoq, for about two years. These medications could be contributing but given upcoming bariatric surgery, need to consider possible GI blood loss. She is also due for 10 year colonoscopy.    PLAN   Continue famotidine daily. Limit ibuprofen use.  Plan for colonoscopy/upper endoscopy with Dr. Levon Hedger. ASA 3.  I have discussed the risks, alternatives, benefits with regards to but not limited to the risk of reaction to medication, bleeding, infection, perforation and the patient is agreeable to proceed. Written consent to be obtained. Update CBC, iron studies.    Leanna Battles. Melvyn Neth, MHS, PA-C [facility]  I have reviewed the note and agree with the APP's assessment as described in this progress note  Katrinka Blazing, MD Gastroenterology and Hepatology [facility]

## 2022-09-18 NOTE — Patient Instructions (Addendum)
Colonoscopy and upper endoscopy to be scheduled.  Please update your labs at [facility]. I will be rechecking your hemoglobin and check for low iron.

## 2022-09-24 ENCOUNTER — Encounter: Payer: Medicare PPO | Attending: Surgery | Admitting: Dietician

## 2022-09-24 ENCOUNTER — Encounter: Payer: Self-pay | Admitting: Dietician

## 2022-09-24 VITALS — Ht 61.0 in | Wt 351.0 lb

## 2022-09-24 DIAGNOSIS — Z713 Dietary counseling and surveillance: Secondary | ICD-10-CM | POA: Diagnosis not present

## 2022-09-24 DIAGNOSIS — Z6841 Body Mass Index (BMI) 40.0 and over, adult: Secondary | ICD-10-CM | POA: Diagnosis not present

## 2022-09-24 DIAGNOSIS — E669 Obesity, unspecified: Secondary | ICD-10-CM | POA: Insufficient documentation
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
## 2022-09-29 ENCOUNTER — Other Ambulatory Visit: Payer: Self-pay | Admitting: Family Medicine

## 2022-09-29 ENCOUNTER — Telehealth: Payer: Self-pay | Admitting: Gastroenterology

## 2022-09-29 NOTE — Telephone Encounter (Signed)
Please remind patient to complete labs.  Proceed with scheduling  Colonoscopy/EGD with Dr. Levon Hedger. ASA 3. Hold phentermine 7 days (or the appropriate number of days per protocol) Dx: microcytic anemia, plans for bariatric surgery

## 2022-09-30 NOTE — Telephone Encounter (Signed)
Pt is out of town and will try to get them done tomorrow. Pt was made aware that the schedulers will be in contact with her to schedule procedures once they have the August schedule. Pt verbalized understanding.

## 2022-09-30 NOTE — Telephone Encounter (Signed)
[name] when you speak with pt advise her we will call to schedule once we have Aug schedule. Thanks

## 2022-10-04 DIAGNOSIS — D649 Anemia, unspecified: Secondary | ICD-10-CM | POA: Diagnosis not present

## 2022-10-04 LAB — TIQ-MISC

## 2022-10-09 LAB — CBC WITH DIFFERENTIAL/PLATELET
Absolute Monocytes: 502 cells/uL (ref 200–950)
Basophils Absolute: 38 cells/uL (ref 0–200)
Basophils Relative: 0.5 %
Eosinophils Absolute: 160 cells/uL (ref 15–500)
Eosinophils Relative: 2.1 %
HCT: 37.5 % (ref 35.0–45.0)
Hemoglobin: 11.2 g/dL — ABNORMAL LOW (ref 11.7–15.5)
Lymphs Abs: 3534 cells/uL (ref 850–3900)
MCH: 23.3 pg — ABNORMAL LOW (ref 27.0–33.0)
MCHC: 29.9 g/dL — ABNORMAL LOW (ref 32.0–36.0)
MCV: 78.1 fL — ABNORMAL LOW (ref 80.0–100.0)
MPV: 10 fL (ref 7.5–12.5)
Monocytes Relative: 6.6 %
Neutro Abs: 3367 cells/uL (ref 1500–7800)
Neutrophils Relative %: 44.3 %
Platelets: 423 10*3/uL — ABNORMAL HIGH (ref 140–400)
RBC: 4.8 10*6/uL (ref 3.80–5.10)
RDW: 14.4 % (ref 11.0–15.0)
Total Lymphocyte: 46.5 %
WBC: 7.6 10*3/uL (ref 3.8–10.8)

## 2022-10-09 LAB — TEST AUTHORIZATION

## 2022-10-10 NOTE — Telephone Encounter (Signed)
LMOVM to call back to schedule 

## 2022-10-14 ENCOUNTER — Ambulatory Visit: Payer: Medicare PPO | Admitting: Dietician

## 2022-10-14 DIAGNOSIS — F4542 Pain disorder with related psychological factors: Secondary | ICD-10-CM | POA: Diagnosis not present

## 2022-10-14 DIAGNOSIS — F4329 Adjustment disorder with other symptoms: Secondary | ICD-10-CM | POA: Diagnosis not present

## 2022-10-15 ENCOUNTER — Encounter: Payer: Medicare PPO | Admitting: Skilled Nursing Facility1

## 2022-10-15 NOTE — Progress Notes (Unsigned)
Supervised Weight Loss Visit Bariatric Nutrition Education Appt Start Time: 7:30am    End Time: 7:51pm  Planned Surgery: sleeve gastrectomy   Pt Expectation of Surgery/ Goals: Lose weight  1 out of 6 SWL Appointments    NUTRITION ASSESSMENT  According to the Dietary Guidelines for Americans Recommendation: equivalent 1.5-2 cups fruits per day, equivalent 2-3 cups vegetables per day and at least half all grains whole  Fruit servings per day (on average): 2, meeting 100% recommendation  Non-starchy vegetable servings per day (on average): 1, meeting 33-50% recommendation  Whole Grains per day (on average): 0  Anthropometrics  Start weight at [facility]: 351.0 lbs (date: 09/24/2022)  Weight: virtual appt: pt identified by name and DOB, pt agreeable to limitaitons of this visit type Height: 61 in BMI:   Clinical   Pharmacotherapy: History of weight loss medication used: phentermine  Medical hx: obesity; HTN Medications: folic acid, vit D, phentermine, lasix, Maxzide, rinvoq Labs: B12 1,882; A1c 6.0; iron saturation 13;  Notable signs/symptoms: none noted Any previous deficiencies? No Allergies: penicillin, aleve (naproxen)  Lifestyle & Dietary Hx  Pt states she lives with her 44 81 year old grandkids, pt states she goes to work at [employer], stating she works 10 hours. Pt states she does not get much sleep stating in the morning she has to do things sometimes. 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 Pt states she cut back on bread and soda. Pt states she takes metamucil in her water every other day.   Estimated daily fluid intake: 64 oz Supplements: Current average weekly physical activity: walking in place 15 minutes 3 days a week  24-Hr Dietary Recall:11:30 home, wakes around 3:30 First Meal 11:30 (supposed to asleep): protein shake with a boiled egg sometimes Snack:  Second Meal 3:30 pm: salad and chicken Snack:  Third Meal 7pm: tuna and fruit   Snack: 1 apple Beverages: water  Estimated Energy Needs Calories: 1500-1700    NUTRITION DIAGNOSIS  Overweight/obesity ([place]-3.3) related to past poor dietary habits and physical inactivity as evidenced by patient w/ planned sleeve surgery following dietary guidelines for continued weight loss.   NUTRITION INTERVENTION  Nutrition counseling (C-1) and education (E-2) to facilitate bariatric surgery goals.  Pre-Op Goals Progress & New Goals Completed: Find a protein shake you like Getting better/continue: Practice not drinking with meals or snacks Continue: Increase my usual daily activity (use stairs, park farther, etc.) NEW: add a fourth day of walking videos  NEW: Make time to get more sleep  Handouts Provided Include    Learning Style & Readiness for Change Teaching method utilized: Visual & Auditory  Demonstrated degree of understanding via: Teach Back  Readiness Level: action Barriers to learning/adherence to lifestyle change: Pt has a busy schedule which makes it difficult to dedicate time to maintain personal goals.  RD's Notes for next Visit  Assess pt's goals.   MONITORING & EVALUATION Dietary intake, weekly physical activity, body weight, and pre-op goals in 1 month.   Next Steps  Patient is to return to [facility] in one month.

## 2022-10-23 NOTE — Telephone Encounter (Signed)
Pt informed that provider is booking out into September. Will call to schedule her once we get his schedule. Verbalized understanding.

## 2022-10-31 ENCOUNTER — Encounter: Payer: Self-pay | Admitting: *Deleted

## 2022-10-31 ENCOUNTER — Other Ambulatory Visit: Payer: Self-pay | Admitting: *Deleted

## 2022-10-31 MED ORDER — PEG 3350-KCL-NA BICARB-NACL 420 G PO SOLR: 4000.0000 mL | Freq: Once | ORAL | 0 refills | Status: AC

## 2022-10-31 NOTE — Telephone Encounter (Signed)
noted 

## 2022-10-31 NOTE — Telephone Encounter (Signed)
[employer] PA: Approved Authorization #[id number]  Tracking #[id number] Dates of service 11/26/2022 - 02/25/2023

## 2022-10-31 NOTE — Telephone Encounter (Signed)
Pt has been scheduled for 11/26/22. Instructions mailed and sent via MyChart and prep sent to the pharmacy.

## 2022-11-07 ENCOUNTER — Other Ambulatory Visit: Payer: Self-pay | Admitting: Family Medicine

## 2022-11-14 ENCOUNTER — Encounter: Payer: Medicare PPO | Admitting: Dietician

## 2022-11-14 ENCOUNTER — Encounter: Payer: Self-pay | Admitting: Dietician

## 2022-11-14 VITALS — Ht 61.0 in | Wt 344.7 lb

## 2022-11-14 DIAGNOSIS — Z6841 Body Mass Index (BMI) 40.0 and over, adult: Secondary | ICD-10-CM | POA: Diagnosis not present

## 2022-11-14 DIAGNOSIS — Z713 Dietary counseling and surveillance: Secondary | ICD-10-CM | POA: Insufficient documentation

## 2022-11-14 DIAGNOSIS — E669 Obesity, unspecified: Secondary | ICD-10-CM | POA: Diagnosis present

## 2022-11-14 NOTE — Progress Notes (Signed)
Supervised Weight Loss Visit Bariatric Nutrition Education Appt Start Time: 7:58 am    End Time: 7:22 pm  Planned Surgery: sleeve gastrectomy   Pt Expectation of Surgery/ Goals: Lose weight  2 out of 6 SWL Appointments   NUTRITION ASSESSMENT  According to the Dietary Guidelines for Americans Recommendation: equivalent 1.5-2 cups fruits per day, equivalent 2-3 cups vegetables per day and at least half all grains whole  Fruit servings per day (on average): 2, meeting 100% recommendation  Non-starchy vegetable servings per day (on average): 1, meeting 33-50% recommendation  Whole Grains per day (on average): 0  Anthropometrics  Start weight at [facility]: 351.0 lbs (date: 09/24/2022)  Weight today: 344.7 lb Height: 61 in BMI: 65.13  Clinical   Pharmacotherapy: History of weight loss medication used: phentermine  Medical hx: obesity; HTN Medications: folic acid, vit D, phentermine, lasix, Maxzide, rinvoq Labs: B12 1,882; A1c 6.0; iron saturation 13;  Notable signs/symptoms: none noted Any previous deficiencies? No Allergies: penicillin, aleve (naproxen)  Lifestyle & Dietary Hx  Pt states she fell twice (August 11th and yesterday) since last visit. Pt states she is trying to get more sleep, stating she is trying to get her sleep together. Pt states she is still practicing not drinking with her meals, stating she is getting better. Pt states she is getting 64 oz  Estimated daily fluid intake: 64 oz Supplements: Current average weekly physical activity: walking in place 15 minutes 3 days a week (not much since last visit, stating she fell after her last appointment.  24-Hr Dietary Recall:11:30 home, wakes around 3:30 First Meal 11:30 (supposed to asleep): protein shake with a boiled egg sometimes Snack:  Second Meal 3:30 pm: salad and chicken Snack:  Third Meal 7pm: tuna and fruit  Snack: 1 apple Beverages: water, half and half tea, water with flavorings  Estimated Energy  Needs Calories: 1500-1700  NUTRITION DIAGNOSIS  Overweight/obesity ([place]-3.3) related to past poor dietary habits and physical inactivity as evidenced by patient w/ planned sleeve surgery following dietary guidelines for continued weight loss.  NUTRITION INTERVENTION  Nutrition counseling (C-1) and education (E-2) to facilitate bariatric surgery goals.  Pre-Op Goals Progress & New Goals Completed: Find a protein shake you like Getting better/continue: Practice not drinking with meals or snacks Continue: Increase my usual daily activity (use stairs, park farther, etc.) Continue: add a fourth day of walking videos  Continue: Make time to get more sleep New: make meal ideas to meal prep snacks and meal work; including non-starchy vegetables, complex carbohydrates and a protein. New: include a protein with each meal or snack  Handouts Provided Include  Meal Ideas Bariatric MyPlate  Learning Style & Readiness for Change Teaching method utilized: Visual & Auditory  Demonstrated degree of understanding via: Teach Back  Readiness Level: action Barriers to learning/adherence to lifestyle change: Pt has a busy schedule which makes it difficult to dedicate time to maintain personal goals.  RD's Notes for next Visit  Assess pt's goals.   MONITORING & EVALUATION Dietary intake, weekly physical activity, body weight, and pre-op goals in 1 month.   Next Steps  Patient is to return to [facility] in one month for next SWL visit.

## 2022-11-19 ENCOUNTER — Other Ambulatory Visit: Payer: Self-pay | Admitting: *Deleted

## 2022-11-19 DIAGNOSIS — M0609 Rheumatoid arthritis without rheumatoid factor, multiple sites: Secondary | ICD-10-CM

## 2022-11-19 DIAGNOSIS — Z79899 Other long term (current) drug therapy: Secondary | ICD-10-CM

## 2022-11-19 MED ORDER — RINVOQ 15 MG PO TB24
15.0000 mg | ORAL_TABLET | Freq: Every day | ORAL | 0 refills | Status: DC
Start: 2022-11-19 — End: 2023-03-10

## 2022-11-19 NOTE — Telephone Encounter (Signed)
Refill request received via fax from [facility] for Rinvoq.  Last Fill: 08/26/2022   Labs: 08/02/2022 CBC and CMP are stable.    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    Okay to refill Rinvoq?

## 2022-11-19 NOTE — Patient Instructions (Signed)
[name]  11/19/2022     @PREFPERIOPPHARMACY @   Your procedure is scheduled on  11/26/2022.   Report to [facility] at  0600  A.M.   Call this number if you have problems the morning of surgery:  [phone]  If you experience any cold or flu symptoms such as cough, fever, chills, shortness of breath, etc. between now and your scheduled surgery, please notify us at the above number.   Remember:  Follow the diet and prep instructions given to you by the office.      Your last dose of phentermine should have been on 11/18/2022.     Take these medicines the morning of surgery with A SIP OF WATER                             pepcid, zanaflex (if needed).     Do not wear jewelry, make-up or nail polish, including gel polish,  artificial nails, or any other type of covering on natural nails (fingers and  toes).  Do not wear lotions, powders, or perfumes, or deodorant.  Do not shave 48 hours prior to surgery.  Men may shave face and neck.  Do not bring valuables to the hospital.  [facility] is not responsible for any belongings or valuables.  Contacts, dentures or bridgework may not be worn into surgery.  Leave your suitcase in the car.  After surgery it may be brought to your room.  For patients admitted to the hospital, discharge time will be determined by your treatment team.  Patients discharged the day of surgery will not be allowed to drive home and must have someone with them for 24 hours.    Special instructions:   DO NOT smoke tobacco or vape for 24 hours before your procedure.  Please read over the following fact sheets that you were given. Anesthesia Post-op Instructions and Care and Recovery After Surgery      Upper Endoscopy, Adult, Care After After the procedure, it is common to have a sore throat. It is also common to have: Mild stomach pain or discomfort. Bloating. Nausea. Follow these instructions at home: The instructions below  may help you care for yourself at home. Your health care provider may give you more instructions. If you have questions, ask your health care provider. If you were given a sedative during the procedure, it can affect you for several hours. Do not drive or operate machinery until your health care provider says that it is safe. If you will be going home right after the procedure, plan to have a responsible adult: Take you home from the hospital or clinic. You will not be allowed to drive. Care for you for the time you are told. Follow instructions from your health care provider about what you may eat and drink. Return to your normal activities as told by your health care provider. Ask your health care provider what activities are safe for you. Take over-the-counter and prescription medicines only as told by your health care provider. Contact a health care provider if you: Have a sore throat that lasts longer than one day. Have trouble swallowing. Have a fever. Get help right away if you: Vomit blood or your vomit looks like coffee grounds. Have bloody, black, or tarry stools. Have a very bad sore throat or you cannot swallow. Have difficulty breathing or very bad pain in your  chest or abdomen. These symptoms may be an emergency. Get help right away. Call 911. Do not wait to see if the symptoms will go away. Do not drive yourself to the hospital. Summary After the procedure, it is common to have a sore throat, mild stomach discomfort, bloating, and nausea. If you were given a sedative during the procedure, it can affect you for several hours. Do not drive until your health care provider says that it is safe. Follow instructions from your health care provider about what you may eat and drink. Return to your normal activities as told by your health care provider. This information is not intended to replace advice given to you by your health care provider. Make sure you discuss any questions you  have with your health care provider. Document Revised: 06/13/2021 Document Reviewed: 06/13/2021 Elsevier Patient Education  2024 Elsevier [employer]. Colonoscopy, Adult, Care After The following information offers guidance on how to care for yourself after your procedure. Your health care provider may also give you more specific instructions. If you have problems or questions, contact your health care provider. What can I expect after the procedure? After the procedure, it is common to have: A small amount of blood in your stool for 24 hours after the procedure. Some gas. Mild cramping or bloating of your abdomen. Follow these instructions at home: Eating and drinking  Drink enough fluid to keep your urine pale yellow. Follow instructions from your health care provider about eating or drinking restrictions. Resume your normal diet as told by your health care provider. Avoid heavy or fried foods that are hard to digest. Activity Rest as told by your health care provider. Avoid sitting for a long time without moving. Get up to take short walks every 1-2 hours. This is important to improve blood flow and breathing. Ask for help if you feel weak or unsteady. Return to your normal activities as told by your health care provider. Ask your health care provider what activities are safe for you. Managing cramping and bloating  Try walking around when you have cramps or feel bloated. If directed, apply heat to your abdomen as told by your health care provider. Use the heat source that your health care provider recommends, such as a moist heat pack or a heating pad. Place a towel between your skin and the heat source. Leave the heat on for 20-30 minutes. Remove the heat if your skin turns bright red. This is especially important if you are unable to feel pain, heat, or cold. You have a greater risk of getting burned. General instructions If you were given a sedative during the procedure, it can affect you  for several hours. Do not drive or operate machinery until your health care provider says that it is safe. For the first 24 hours after the procedure: Do not sign important documents. Do not drink alcohol. Do your regular daily activities at a slower pace than normal. Eat soft foods that are easy to digest. Take over-the-counter and prescription medicines only as told by your health care provider. Keep all follow-up visits. This is important. Contact a health care provider if: You have blood in your stool 2-3 days after the procedure. Get help right away if: You have more than a small spotting of blood in your stool. You have large blood clots in your stool. You have swelling of your abdomen. You have nausea or vomiting. You have a fever. You have increasing pain in your abdomen that is not  relieved with medicine. These symptoms may be an emergency. Get help right away. Call 911. Do not wait to see if the symptoms will go away. Do not drive yourself to the hospital. Summary After the procedure, it is common to have a small amount of blood in your stool. You may also have mild cramping and bloating of your abdomen. If you were given a sedative during the procedure, it can affect you for several hours. Do not drive or operate machinery until your health care provider says that it is safe. Get help right away if you have a lot of blood in your stool, nausea or vomiting, a fever, or increased pain in your abdomen. This information is not intended to replace advice given to you by your health care provider. Make sure you discuss any questions you have with your health care provider. Document Revised: 04/16/2022 Document Reviewed: 10/25/2020 Elsevier Patient Education  2024 Elsevier Inc. Monitored Anesthesia Care, Care After The following information offers guidance on how to care for yourself after your procedure. Your health care provider may also give you more specific instructions. If you  have problems or questions, contact your health care provider. What can I expect after the procedure? After the procedure, it is common to have: Tiredness. Little or no memory about what happened during or after the procedure. Impaired judgment when it comes to making decisions. Nausea or vomiting. Some trouble with balance. Follow these instructions at home: For the time period you were told by your health care provider:  Rest. Do not participate in activities where you could fall or become injured. Do not drive or use machinery. Do not drink alcohol. Do not take sleeping pills or medicines that cause drowsiness. Do not make important decisions or sign legal documents. Do not take care of children on your own. Medicines Take over-the-counter and prescription medicines only as told by your health care provider. If you were prescribed antibiotics, take them as told by your health care provider. Do not stop using the antibiotic even if you start to feel better. Eating and drinking Follow instructions from your health care provider about what you may eat and drink. Drink enough fluid to keep your urine pale yellow. If you vomit: Drink clear fluids slowly and in small amounts as you are able. Clear fluids include water, ice chips, low-calorie sports drinks, and fruit juice that has water added to it (diluted fruit juice). Eat light and bland foods in small amounts as you are able. These foods include bananas, applesauce, rice, lean meats, toast, and crackers. General instructions  Have a responsible adult stay with you for the time you are told. It is important to have someone help care for you until you are awake and alert. If you have sleep apnea, surgery and some medicines can increase your risk for breathing problems. Follow instructions from your health care provider about wearing your sleep device: When you are sleeping. This includes during daytime naps. While taking prescription  pain medicines, sleeping medicines, or medicines that make you drowsy. Do not use any products that contain nicotine or tobacco. These products include cigarettes, chewing tobacco, and vaping devices, such as e-cigarettes. If you need help quitting, ask your health care provider. Contact a health care provider if: You feel nauseous or vomit every time you eat or drink. You feel light-headed. You are still sleepy or having trouble with balance after 24 hours. You get a rash. You have a fever. You have redness or swelling  around the IV site. Get help right away if: You have trouble breathing. You have new confusion after you get home. These symptoms may be an emergency. Get help right away. Call 911. Do not wait to see if the symptoms will go away. Do not drive yourself to the hospital. This information is not intended to replace advice given to you by your health care provider. Make sure you discuss any questions you have with your health care provider. Document Revised: 07/30/2021 Document Reviewed: 07/30/2021 Elsevier Patient Education  2024 [employer].

## 2022-11-21 ENCOUNTER — Encounter (HOSPITAL_COMMUNITY)
Admission: RE | Admit: 2022-11-21 | Discharge: 2022-11-21 | Disposition: A | Discharge status: Home / Self Care | Payer: Medicare PPO | Source: Ambulatory Visit | Attending: Gastroenterology | Admitting: Gastroenterology

## 2022-11-21 ENCOUNTER — Encounter (HOSPITAL_COMMUNITY): Payer: Self-pay

## 2022-11-21 ENCOUNTER — Other Ambulatory Visit: Payer: Self-pay

## 2022-11-21 VITALS — BP 141/68 | HR 60 | Temp 97.8°F | Resp 18 | Ht 61.0 in | Wt 344.7 lb

## 2022-11-21 DIAGNOSIS — R7303 Prediabetes: Secondary | ICD-10-CM | POA: Insufficient documentation

## 2022-11-21 DIAGNOSIS — Z01812 Encounter for preprocedural laboratory examination: Secondary | ICD-10-CM | POA: Insufficient documentation

## 2022-11-21 LAB — BASIC METABOLIC PANEL
Anion gap: 8 (ref 5–15)
BUN: 20 mg/dL (ref 6–20)
CO2: 26 mmol/L (ref 22–32)
Calcium: 8.4 mg/dL — ABNORMAL LOW (ref 8.9–10.3)
Chloride: 103 mmol/L (ref 98–111)
Creatinine, Ser: 1 mg/dL (ref 0.44–1.00)
GFR, Estimated: >60 mL/min (ref >60)
Glucose, Bld: 114 mg/dL — ABNORMAL HIGH (ref 70–99)
Potassium: 3.7 mmol/L (ref 3.5–5.1)
Sodium: 137 mmol/L (ref 135–145)

## 2022-11-26 ENCOUNTER — Telehealth: Payer: Self-pay

## 2022-11-26 ENCOUNTER — Ambulatory Visit (HOSPITAL_COMMUNITY)
Admission: RE | Admit: 2022-11-26 | Discharge: 2022-11-26 | Disposition: A | Discharge status: Home / Self Care | Payer: Medicare PPO | Attending: Gastroenterology | Admitting: Gastroenterology

## 2022-11-26 ENCOUNTER — Telehealth (INDEPENDENT_AMBULATORY_CARE_PROVIDER_SITE_OTHER): Payer: Self-pay | Admitting: *Deleted

## 2022-11-26 ENCOUNTER — Encounter (HOSPITAL_COMMUNITY)
Admission: RE | Disposition: A | Discharge status: Home / Self Care | Payer: Self-pay | Source: Home / Self Care | Attending: Gastroenterology

## 2022-11-26 ENCOUNTER — Ambulatory Visit (HOSPITAL_COMMUNITY): Payer: Medicare PPO | Admitting: Certified Registered"

## 2022-11-26 ENCOUNTER — Encounter (HOSPITAL_COMMUNITY): Payer: Self-pay | Admitting: Gastroenterology

## 2022-11-26 DIAGNOSIS — K219 Gastro-esophageal reflux disease without esophagitis: Secondary | ICD-10-CM | POA: Insufficient documentation

## 2022-11-26 DIAGNOSIS — I10 Essential (primary) hypertension: Secondary | ICD-10-CM | POA: Insufficient documentation

## 2022-11-26 DIAGNOSIS — E119 Type 2 diabetes mellitus without complications: Secondary | ICD-10-CM | POA: Insufficient documentation

## 2022-11-26 DIAGNOSIS — D175 Benign lipomatous neoplasm of intra-abdominal organs: Secondary | ICD-10-CM | POA: Diagnosis not present

## 2022-11-26 DIAGNOSIS — D509 Iron deficiency anemia, unspecified: Secondary | ICD-10-CM | POA: Diagnosis not present

## 2022-11-26 DIAGNOSIS — K2289 Other specified disease of esophagus: Secondary | ICD-10-CM | POA: Insufficient documentation

## 2022-11-26 DIAGNOSIS — K648 Other hemorrhoids: Secondary | ICD-10-CM | POA: Diagnosis not present

## 2022-11-26 DIAGNOSIS — Z6841 Body Mass Index (BMI) 40.0 and over, adult: Secondary | ICD-10-CM | POA: Diagnosis not present

## 2022-11-26 DIAGNOSIS — Z01818 Encounter for other preprocedural examination: Secondary | ICD-10-CM | POA: Insufficient documentation

## 2022-11-26 DIAGNOSIS — K2281 Esophageal polyp: Secondary | ICD-10-CM

## 2022-11-26 DIAGNOSIS — M069 Rheumatoid arthritis, unspecified: Secondary | ICD-10-CM | POA: Diagnosis not present

## 2022-11-26 DIAGNOSIS — K449 Diaphragmatic hernia without obstruction or gangrene: Secondary | ICD-10-CM

## 2022-11-26 HISTORY — PX: BIOPSY: SHX5522

## 2022-11-26 HISTORY — PX: ESOPHAGOGASTRODUODENOSCOPY (EGD) WITH PROPOFOL: SHX5813

## 2022-11-26 HISTORY — PX: COLONOSCOPY WITH PROPOFOL: SHX5780

## 2022-11-26 LAB — GLUCOSE, CAPILLARY: Glucose-Capillary: 99 mg/dL (ref 70–99)

## 2022-11-26 LAB — HM COLONOSCOPY

## 2022-11-26 SURGERY — COLONOSCOPY WITH PROPOFOL
Anesthesia: General

## 2022-11-26 MED ORDER — PROPOFOL 10 MG/ML IV BOLUS
INTRAVENOUS | Status: DC | PRN
Start: 2022-11-26 — End: 2022-11-26
  Administered 2022-11-26: 20 mg via INTRAVENOUS
  Administered 2022-11-26: 50 mg via INTRAVENOUS
  Administered 2022-11-26 (×2): 20 mg via INTRAVENOUS
  Administered 2022-11-26: 100 mg via INTRAVENOUS
  Administered 2022-11-26: 20 mg via INTRAVENOUS
  Administered 2022-11-26: 100 mg via INTRAVENOUS
  Administered 2022-11-26: 20 mg via INTRAVENOUS
  Administered 2022-11-26 (×2): 50 mg via INTRAVENOUS
  Administered 2022-11-26: 20 mg via INTRAVENOUS
  Administered 2022-11-26: 50 mg via INTRAVENOUS
  Administered 2022-11-26: 20 mg via INTRAVENOUS

## 2022-11-26 MED ORDER — LACTATED RINGERS IV SOLN: INTRAVENOUS | Status: DC

## 2022-11-26 MED ORDER — LIDOCAINE HCL (CARDIAC) PF 100 MG/5ML IV SOSY
PREFILLED_SYRINGE | INTRAVENOUS | Status: DC | PRN
Start: 2022-11-26 — End: 2022-11-26
  Administered 2022-11-26: 100 mg via INTRAVENOUS

## 2022-11-26 MED ORDER — FERROUS SULFATE 324 (65 FE) MG PO TBEC: 1.0000 | DELAYED_RELEASE_TABLET | Freq: Every day | ORAL | 3 refills | Status: DC

## 2022-11-26 MED ORDER — LACTATED RINGERS IV SOLN: INTRAVENOUS | Status: DC | PRN

## 2022-11-26 NOTE — Op Note (Signed)
[facility] Patient Name: [name] Procedure Date: 11/26/2022 7:51 AM MRN: [id number] Date of Birth: April 24, 1960 Attending MD: Katrinka Blazing , , [id number] CSN: [id number] Age: 62 Admit Type: Outpatient Procedure:                Colonoscopy Indications:              Iron deficiency anemia, Preoperative assessment Providers:                Katrinka Blazing, Sheran Fava, Kristine L.                            Jessee Avers, Technician Referring MD:              Medicines:                Monitored Anesthesia Care Complications:            No immediate complications. Estimated Blood Loss:     Estimated blood loss: none. Procedure:                Pre-Anesthesia Assessment:                           - Prior to the procedure, a History and Physical                            was performed, and patient medications, allergies                            and sensitivities were reviewed. The patient's                            tolerance of previous anesthesia was reviewed.                           - The risks and benefits of the procedure and the                            sedation options and risks were discussed with the                            patient. All questions were answered and informed                            consent was obtained.                           - ASA Grade Assessment: III - A patient with severe                            systemic disease.                           After obtaining informed consent, the colonoscope                            was passed under direct vision. Throughout the  procedure, the patient's blood pressure, pulse, and                            oxygen saturations were monitored continuously. The                            [id number]-HQ190L ([id number]) scope was introduced through                            the anus and advanced to the 15 cm into the ileum.                            The colonoscopy was  performed without difficulty.                            The patient tolerated the procedure well. The                            quality of the bowel preparation was good. Scope In: 7:55:55 AM Scope Out: 8:08:16 AM Scope Withdrawal Time: 0 hours 9 minutes 56 seconds  Total Procedure Duration: 0 hours 12 minutes 21 seconds  Findings:      The perianal and digital rectal examinations were normal.      The terminal ileum appeared normal.      There was a small lipoma, in the ascending colon.      Non-bleeding internal hemorrhoids were found during retroflexion. The       hemorrhoids were small. Impression:               - The examined portion of the ileum was normal.                           - Small lipoma in the ascending colon.                           - Non-bleeding internal hemorrhoids.                           - No specimens collected. Moderate Sedation:      Per Anesthesia Care Recommendation:           - Discharge patient to home (ambulatory).                           - Resume previous diet.                           - Repeat colonoscopy in 10 years for screening                            purposes.                           - Schedule capsule endoscopy. 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 All rights reserved. The  codes documented in this report are preliminary and upon coder review may  be revised to meet current compliance requirements. Katrinka Blazing, MD Katrinka Blazing,  11/26/2022 8:17:46 AM This report has been signed electronically. Number of Addenda: 0

## 2022-11-26 NOTE — Telephone Encounter (Signed)
Message sent to [facility]

## 2022-11-26 NOTE — Telephone Encounter (Signed)
-----   Message from Katrinka Blazing [place] sent at 11/26/2022  7:54 AM EDT ----- Casandra Doffing, can you please refer the patient to Dr. Meridee Score for non urgent EUS Dx; esophageal nodule  Thanks,   Katrinka Blazing, MD Gastroenterology and Hepatology [facility]

## 2022-11-26 NOTE — Telephone Encounter (Signed)
-----   Message from [facility] sent at 11/26/2022 12:13 PM EDT ----- Alexia Freestone, Agree with indication/need. Nonurgent Upper EUS in the next 3-4 months is reasonable to evaluate. Thanks. GM ----- Message ----- From: Loretha Stapler, RN Sent: 11/26/2022   8:12 AM EDT To: Larose Kells., MD   ----- Message ----- From: Simone Curia Sent: 11/26/2022   8:11 AM EDT To: Loretha Stapler, RN  Per Dr Levon Hedger please refer the patient to Dr. Meridee Score for non urgent EUS Dx; esophageal nodule

## 2022-11-26 NOTE — H&P (Signed)
Antwanette AAROHI EICHORN is an 62 y.o. female.   Chief Complaint: iron deficiency/microcytic anemia HPI: Querida ITTA SCHECHTER is a 62 y.o. female  with PMH depression, GERD, syncope, DM, RA, obesity, coming for  iron deficiency/microcytic anemia.  The patient denies having any nausea, vomiting, fever, chills, hematochezia, melena, hematemesis, abdominal distention, abdominal pain, diarrhea, jaundice, pruritus or weight loss.  Past Medical History:  Diagnosis Date   Allergy    Annual visit for general adult medical examination with abnormal findings 01/02/2021   Anxiety    Sometimes   Chronic back pain    Degenerative joint disease    Left TKA in 07/2009-Dr. 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 Lovell Sheehan   COLONOSCOPY N/A 03/03/2013   Procedure: COLONOSCOPY;  Surgeon: Malissa Hippo, MD;  Location: [facility];  Service: Endoscopy;  Laterality: N/A;  930   CRANIECTOMY SUBOCCIPITAL W/ CERVICAL LAMINECTOMY / CHIARI  2007   Repair of Arnold-Chiari malformation   EYE SURGERY     bilateral cataract surgery   JOINT REPLACEMENT Left 2011   KNEE ARTHROSCOPY  1995   Dr. Cleophas Dunker     THYROIDECTOMY, PARTIAL  2004   Left lobectomy - large benign nodule   TONSILLECTOMY     TOTAL KNEE ARTHROPLASTY  07/2009   Left knee , Dr. Dion Saucier    TOTAL KNEE ARTHROPLASTY Right 07/13/2013   Procedure: TOTAL KNEE ARTHROPLASTY;  Surgeon: Eulas Post, MD;  Location: [facility] OR;  Service: Orthopedics;  Laterality: Right;   TUBAL LIGATION      Family History  Problem Relation Age of Onset   Diabetes Mother    Depression Mother    Diabetes Father        emphsema    Hyperlipidemia Father    Emphysema Father    Rheum arthritis Sister    Breast cancer Sister    Cancer Sister    Healthy Daughter    Healthy Daughter    Healthy Daughter    Colon cancer Neg Hx    Social History:  reports that she has never smoked. She has never been exposed to tobacco smoke. She has never used smokeless tobacco. She reports current alcohol use of about 10.0 standard drinks of alcohol per week. She reports that she does not use drugs.  Allergies:  Allergies  Allergen Reactions   Aleve [Naproxen Sodium] Shortness Of Breath   Penicillins Swelling    .Has patient had a PCN reaction causing immediate rash, facial/tongue/throat swelling, SOB or lightheadedness with hypotension: Yes Has patient had a  PCN reaction causing severe rash involving mucus membranes or skin necrosis: No Has patient had a PCN reaction that required hospitalization: No Has patient had a PCN reaction occurring within the last 10 years: No If all of the above answers are "NO", then may proceed with Cephalosporin use.    Adhesive [Tape]     rash   Latex Rash    Medications Prior to Admission  Medication Sig Dispense Refill   folic acid (FOLVITE) 1 MG tablet TAKE 2 TABLETS BY MOUTH EVERY DAY 180 tablet 3   ibuprofen (ADVIL) 800 MG tablet Take 800 mg by mouth every 6 (six) hours as needed.     triamterene-hydrochlorothiazide (MAXZIDE-25) 37.5-25 MG tablet TAKE 1 TABLET BY MOUTH EVERY DAY 90 tablet 1   Upadacitinib ER (RINVOQ) 15 MG  TB24 Take 1 tablet (15 mg total) by mouth daily. 30 tablet 0   VITAMIN D PO Take by mouth daily.     acyclovir (ZOVIRAX) 400 MG tablet Take 400 mg by mouth 3 (three) times daily.     famotidine (PEPCID) 40 MG tablet TAKE 1 TABLET BY MOUTH EVERY DAY 90 tablet 1   methotrexate 50 MG/2ML injection Inject 0.6 mLs (15 mg total) into the skin once a week. 8 mL 0   montelukast (SINGULAIR) 10 MG tablet TAKE 1 TABLET BY MOUTH EVERYDAY AT BEDTIME 90 tablet 1   phentermine (ADIPEX-P) 37.5 MG tablet TAKE 1 TABLET EVERY DAY BEFORE BREAKFAST 30 tablet 0   potassium chloride SA (KLOR-CON M) 20 MEQ tablet Take one  tablet twice daily, as needed, on days when you take furosemide for leg swelling 20 tablet 0   tiZANidine (ZANAFLEX) 2 MG tablet SMARTSIG:2 Tablet(s) By Mouth Every 6-8 Hours PRN     TUBERCULIN SYR 1CC/27GX1/2" (B-D TB SYRINGE 1CC/27GX1/2") 27G X 1/2" 1 ML MISC USE 1 SYRINGE ONCE WEEKLY TO INJECT METHOTREXATE 12 each 3    Results for orders placed or performed during the hospital encounter of 11/26/22 (from the past 48 hour(s))  Glucose, capillary     Status: None   Collection Time: 11/26/22  6:47 AM  Result Value Ref Range   Glucose-Capillary 99 70 - 99 mg/dL    Comment: Glucose reference range applies only to samples taken after fasting for at least 8 hours.   No results found.  Review of Systems  All other systems reviewed and are negative.   Blood pressure (!) 173/90, pulse (!) 58, temperature 98 F (36.7 C), resp. rate 20, height 5\' 1"  (1.549 m), weight (!) 344 lb 12.8 oz (156.4 kg), SpO2 99%. Physical Exam  GENERAL: The patient is AO x3, in no acute distress. HEENT: Head is normocephalic and atraumatic. EOMI are intact. Mouth is well hydrated and without lesions. NECK: Supple. No masses LUNGS: Clear to auscultation. No presence of rhonchi/wheezing/rales. Adequate chest expansion HEART: RRR, normal s1 and s2. ABDOMEN: Soft, nontender, no guarding, no peritoneal signs, and  nondistended. BS +. No masses. EXTREMITIES: Without any cyanosis, clubbing, rash, lesions or edema. NEUROLOGIC: AOx3, no focal motor deficit. SKIN: no jaundice, no rashes  Assessment/Plan Adalyne ZYANAH BRIGHTWELL is a 62 y.o. female  with PMH depression, GERD, syncope, DM, RA, obesity, coming for  iron deficiency/microcytic anemia. Will proceed with EGD and colonoscopy.  Dolores Frame, MD 11/26/2022, 7:29 AM

## 2022-11-26 NOTE — Discharge Instructions (Addendum)
You are being discharged to home.  Resume your previous diet.  We are waiting for your pathology results.  Start ferrous sulfate 325 mg daily. Referral for EUS to evaluate esophageal nodule. Your physician has recommended a repeat colonoscopy in 10 years for screening purposes.  Schedule capsule endoscopy.

## 2022-11-26 NOTE — Anesthesia Preprocedure Evaluation (Signed)
Anesthesia Evaluation  Patient identified by MRN, date of birth, ID band Patient awake    Reviewed: Allergy & Precautions, H&P , NPO status , Patient's Chart, lab work & pertinent test results, reviewed documented beta blocker date and time   Airway Mallampati: II  TM Distance: >3 FB Neck ROM: full    Dental no notable dental hx.    Pulmonary neg pulmonary ROS   Pulmonary exam normal breath sounds clear to auscultation       Cardiovascular Exercise Tolerance: Good hypertension, negative cardio ROS  Rhythm:regular Rate:Normal     Neuro/Psych  Headaches PSYCHIATRIC DISORDERS Anxiety Depression     Neuromuscular disease negative neurological ROS  negative psych ROS   GI/Hepatic negative GI ROS, Neg liver ROS,GERD  ,,  Endo/Other  negative endocrine ROSdiabetes, Type 2  Morbid obesity  Renal/GU negative Renal ROS  negative genitourinary   Musculoskeletal  (+) Arthritis , Rheumatoid disorders,    Abdominal   Peds  Hematology negative hematology ROS (+) Blood dyscrasia, anemia   Anesthesia Other Findings   Reproductive/Obstetrics negative OB ROS                             Anesthesia Physical Anesthesia Plan  ASA: 3  Anesthesia Plan: General   Post-op Pain Management:    Induction:   PONV Risk Score and Plan: Propofol infusion  Airway Management Planned:   Additional Equipment:   Intra-op Plan:   Post-operative Plan:   Informed Consent: I have reviewed the patients History and Physical, chart, labs and discussed the procedure including the risks, benefits and alternatives for the proposed anesthesia with the patient or authorized representative who has indicated his/her understanding and acceptance.     Dental Advisory Given  Plan Discussed with: CRNA  Anesthesia Plan Comments:        Anesthesia Quick Evaluation

## 2022-11-26 NOTE — Transfer of Care (Signed)
Immediate Anesthesia Transfer of Care Note  Patient: [name] Care  Procedure(s) Performed: COLONOSCOPY WITH PROPOFOL ESOPHAGOGASTRODUODENOSCOPY (EGD) WITH PROPOFOL BIOPSY  Patient Location: PACU  Anesthesia Type:General  Level of Consciousness: awake, alert , oriented, and patient cooperative  Airway & Oxygen Therapy: Patient Spontanous Breathing  Post-op Assessment: Report given to RN, Post -op Vital signs reviewed and stable, and Patient moving all extremities X 4  Post vital signs: Reviewed and stable  Last Vitals:  Vitals Value Taken Time  BP    Temp    Pulse    Resp    SpO2     VSS and to be entered by PACU RN. Bay 2. Pt awake and talking.  Last Pain:  Vitals:   11/26/22 0738  PainSc: 0-No pain         Complications: No notable events documented.

## 2022-11-26 NOTE — Op Note (Signed)
[facility] Patient Name: [name] Procedure Date: 11/26/2022 7:13 AM MRN: [id number] Date of Birth: March 15, 1961 Attending MD: Katrinka Blazing , , [id number] CSN: [id number] Age: 62 Admit Type: Outpatient Procedure:                Upper GI endoscopy Indications:              Iron deficiency anemia Providers:                Katrinka Blazing, Sheran Fava, Kristine L.                            Jessee Avers, Technician Referring MD:              Medicines:                Monitored Anesthesia Care Complications:            No immediate complications. Estimated Blood Loss:     Estimated blood loss: none. Procedure:                Pre-Anesthesia Assessment:                           - Prior to the procedure, a History and Physical                            was performed, and patient medications, allergies                            and sensitivities were reviewed. The patient's                            tolerance of previous anesthesia was reviewed.                           - The risks and benefits of the procedure and the                            sedation options and risks were discussed with the                            patient. All questions were answered and informed                            consent was obtained.                           - ASA Grade Assessment: III - A patient with severe                            systemic disease.                           After obtaining informed consent, the endoscope was                            passed under direct vision. Throughout the  procedure, the patient's blood pressure, pulse, and                            oxygen saturations were monitored continuously. The                            GIF-H190 ([id number]) scope was introduced through the                            mouth, and advanced to the second part of duodenum.                            The upper GI endoscopy was accomplished  without                            difficulty. The patient tolerated the procedure                            well. Scope In: 7:42:34 AM Scope Out: 7:48:58 AM Total Procedure Duration: 0 hours 6 minutes 24 seconds  Findings:      A single 5 mm yellowish submucosal nodule was found in the lower third       of the esophagus, 34 cm from the incisors. The nodule was Paris       classification Is (protruding, sessile).      The Z-line was irregular and was found 35 cm from the incisors. Biopsies       were taken with a cold forceps for histology.      A 2 cm hiatal hernia was present.      The stomach was normal.      The examined duodenum was normal. Biopsies were taken with a cold       forceps for histology. Impression:               - Submucosal nodule found in the esophagus.                           - Z-line irregular, 35 cm from the incisors.                            Biopsied.                           - 2 cm hiatal hernia.                           - Normal stomach.                           - Normal examined duodenum. Biopsied. Moderate Sedation:      Per Anesthesia Care Recommendation:           - Discharge patient to home (ambulatory).                           - Resume previous diet.                           -  Await pathology results.                           - Start ferrous sulfate 325 mg daily.                           - Referral for EUS to evaluate esophageal nodule. Procedure Code(s):        --- Professional ---                           [phone], Esophagogastroduodenoscopy, flexible,                            transoral; with biopsy, single or multiple Diagnosis Code(s):        --- Professional ---                           K22.89, Other specified disease of esophagus                           K44.9, Diaphragmatic hernia without obstruction or                            gangrene                           D50.9, Iron deficiency anemia, unspecified CPT copyright 2022  American Medical Association. All rights reserved. The codes documented in this report are preliminary and upon coder review may  be revised to meet current compliance requirements. Katrinka Blazing, MD Katrinka Blazing,  11/26/2022 7:54:11 AM This report has been signed electronically. Number of Addenda: 0

## 2022-11-27 ENCOUNTER — Encounter (INDEPENDENT_AMBULATORY_CARE_PROVIDER_SITE_OTHER): Payer: Self-pay | Admitting: *Deleted

## 2022-11-27 ENCOUNTER — Telehealth (INDEPENDENT_AMBULATORY_CARE_PROVIDER_SITE_OTHER): Payer: Self-pay | Admitting: *Deleted

## 2022-11-27 LAB — SURGICAL PATHOLOGY

## 2022-11-27 NOTE — Telephone Encounter (Signed)
Per TCS op note - schedule capsule endoscopy

## 2022-11-27 NOTE — Telephone Encounter (Signed)
PA pending

## 2022-11-27 NOTE — Anesthesia Postprocedure Evaluation (Signed)
Anesthesia Post Note  Patient: [name]  Procedure(s) Performed: COLONOSCOPY WITH PROPOFOL ESOPHAGOGASTRODUODENOSCOPY (EGD) WITH PROPOFOL BIOPSY  Patient location during evaluation: Phase II Anesthesia Type: General Level of consciousness: awake Pain management: pain level controlled Vital Signs Assessment: post-procedure vital signs reviewed and stable Respiratory status: spontaneous breathing and respiratory function stable Cardiovascular status: blood pressure returned to baseline and stable Postop Assessment: no headache and no apparent nausea or vomiting Anesthetic complications: no Comments: Late entry   No notable events documented.   Last Vitals:  Vitals:   11/26/22 0649 11/26/22 0813  BP: (!) 173/90 (!) 105/92  Pulse: (!) 58 70  Resp: 20 20  Temp: 36.7 C 36.4 C  SpO2: 99% 97%    Last Pain:  Vitals:   11/26/22 0813  PainSc: 0-No pain                 Windell Norfolk

## 2022-11-28 ENCOUNTER — Encounter (INDEPENDENT_AMBULATORY_CARE_PROVIDER_SITE_OTHER): Payer: Self-pay | Admitting: *Deleted

## 2022-11-29 ENCOUNTER — Other Ambulatory Visit: Payer: Self-pay

## 2022-11-29 ENCOUNTER — Other Ambulatory Visit: Payer: Self-pay | Admitting: Physician Assistant

## 2022-11-29 DIAGNOSIS — K229 Disease of esophagus, unspecified: Secondary | ICD-10-CM

## 2022-11-29 NOTE — Telephone Encounter (Signed)
EUS has been set up for 02/17/23 at 2 pm at [facility] with GM   Left message on machine to call back

## 2022-11-29 NOTE — Telephone Encounter (Signed)
Fax received from [facility] stating they need more records. Clinical notes, TCS/EGD note and recent lab notes fax to Reilani L at [phone] per fax.

## 2022-11-29 NOTE — Telephone Encounter (Signed)
Last Fill: 08/29/2022    Labs: 10/04/2022 Hgb 11.2, MCV 78.1, MCH 23.3, MCHC 29.9, Platelets 423, 11/21/2022 Glucose 114, Calcium 8.4   Next Visit: 01/09/2023   Last Visit: 03/22/2022   DX: Rheumatoid arthritis of multiple sites with negative rheumatoid factor    Current Dose per office note 03/22/2022: MTX 1.0 ml sq injections once weekly, and folic acid 2 mg po daily.    Okay to refill Methotrexate?

## 2022-11-29 NOTE — Telephone Encounter (Signed)
EUS scheduled, pt instructed and medications reviewed.  Patient instructions mailed to home.  Patient to call with any questions or concerns.

## 2022-12-03 NOTE — Telephone Encounter (Signed)
Pt contacted and scheduled for 12/13/22 at 7:30am. Instructions sent via my chart.

## 2022-12-03 NOTE — Telephone Encounter (Signed)
PA from [facility] approving Capule endoscopy. [employer] Reference Number: [id number] Valid dates 11/27/22-02/26/23

## 2022-12-05 ENCOUNTER — Encounter: Payer: Self-pay | Admitting: Family Medicine

## 2022-12-05 ENCOUNTER — Ambulatory Visit (INDEPENDENT_AMBULATORY_CARE_PROVIDER_SITE_OTHER): Payer: Medicare PPO | Admitting: Family Medicine

## 2022-12-05 VITALS — BP 120/82 | HR 70 | Ht 61.0 in | Wt 348.0 lb

## 2022-12-05 DIAGNOSIS — M0600 Rheumatoid arthritis without rheumatoid factor, unspecified site: Secondary | ICD-10-CM

## 2022-12-05 DIAGNOSIS — R7303 Prediabetes: Secondary | ICD-10-CM | POA: Diagnosis not present

## 2022-12-05 DIAGNOSIS — I10 Essential (primary) hypertension: Secondary | ICD-10-CM

## 2022-12-05 DIAGNOSIS — E559 Vitamin D deficiency, unspecified: Secondary | ICD-10-CM | POA: Diagnosis not present

## 2022-12-05 DIAGNOSIS — E538 Deficiency of other specified B group vitamins: Secondary | ICD-10-CM | POA: Diagnosis not present

## 2022-12-05 DIAGNOSIS — Z1231 Encounter for screening mammogram for malignant neoplasm of breast: Secondary | ICD-10-CM | POA: Diagnosis not present

## 2022-12-05 DIAGNOSIS — Z23 Encounter for immunization: Secondary | ICD-10-CM | POA: Diagnosis not present

## 2022-12-05 MED ORDER — MONTELUKAST SODIUM 10 MG PO TABS: 10.0000 mg | ORAL_TABLET | Freq: Every day | ORAL | 1 refills | Status: DC

## 2022-12-05 MED ORDER — PHENTERMINE HCL 37.5 MG PO TABS: 37.5000 mg | ORAL_TABLET | Freq: Every day | ORAL | 2 refills | Status: DC

## 2022-12-05 NOTE — Patient Instructions (Addendum)
Annual exam early December, will serve as pre op also, call if you need me sooner  Vit D, B12, hBa1C  today  pLease schedule your mammogram  Flu vaccine today  It is important that you exercise regularly at least 30 minutes 5 times a week. If you develop chest pain, have severe difficulty breathing, or feel very tired, stop exercising immediately and seek medical attention   Think about what you will eat, plan ahead. Choose " clean, green, fresh or frozen" over canned, processed or packaged foods which are more sugary, salty and fatty. 70 to 75% of food eaten should be vegetables and fruit. Three meals at set times with snacks allowed between meals, but they must be fruit or vegetables. Aim to eat over a 12 hour period , example 7 am to 7 pm, and STOP after  your last meal of the day. Drink water,generally about 64 ounces per day, no other drink is as healthy. Fruit juice is best enjoyed in a healthy way, by EATING the fruit. Thanks for choosing [facility], we consider it a privelige to serve you.

## 2022-12-06 LAB — VITAMIN D 25 HYDROXY (VIT D DEFICIENCY, FRACTURES): Vit D, 25-Hydroxy: 39.9 ng/mL (ref 30.0–100.0)

## 2022-12-06 LAB — HEMOGLOBIN A1C
Est. average glucose Bld gHb Est-mCnc: 137 mg/dL
Hgb A1c MFr Bld: 6.4 % — ABNORMAL HIGH (ref 4.8–5.6)

## 2022-12-06 LAB — VITAMIN B12: Vitamin B-12: 1827 pg/mL — ABNORMAL HIGH (ref 232–1245)

## 2022-12-09 NOTE — Assessment & Plan Note (Signed)
Adequately corrected on current supplement dose , continue same

## 2022-12-09 NOTE — Progress Notes (Signed)
[name]     MRN: [id number]      DOB: Aug 16, 1960  Chief Complaint  Patient presents with   Follow-up    Follow up requesting ears checked    HPI Ms. Spagnoli is here for follow up and re-evaluation of chronic medical conditions, medication management and review of any available recent lab and radiology data.  Preventive health is updated, specifically  Cancer screening and Immunization.   Going through necessary evaluation and testing for bariatric surgery , hich she is looking forward to as she has been extremely unsuccessful with weight  loss attempts over the years, states wants to continue phentermine leading up to proposed surgery and that surgeon is aware and she will d/c 3 weeks before surgery, no weight loss noted The PT denies any adverse reactions to current medications since the last visit.     ROS Denies recent fever or chills. Denies sinus pressure, nasal congestion,  or sore throat. Denies chest congestion, productive cough or wheezing. Denies chest pains, palpitations and leg swelling Denies abdominal pain, nausea, vomiting,diarrhea or constipation.   Denies dysuria, frequency, hesitancy or incontinence. Chronic  joint pain, swelling and limitation in mobility. Denies headaches, seizures, numbness, or tingling. Denies depression, anxiety or insomnia. Denies skin break down or rash.   PE  BP 120/82   Pulse 70   Ht 5\' 1"  (1.549 m)   Wt (!) 348 lb 0.6 oz (157.9 kg)   SpO2 98%   BMI 65.76 kg/m   Patient alert and oriented and in no cardiopulmonary distress.  HEENT: No facial asymmetry, EOMI,     Neck supple .TM clear bilaterally , good light reflex, no wax buildup, mild  inflammation of left outer ear canl  Chest: Clear to auscultation bilaterally.  CVS: S1, S2 no murmurs, no S3.Regular rate.  ABD: Soft non tender.   Ext: No edema  MS: Adequate ROM spine, shoulders, hips and knees.  Skin: Intact, no ulcerations or rash noted.  Psych:  Good eye contact, normal affect. Memory intact not anxious or depressed appearing.  CNS: CN 2-12 intact, power,  normal throughout.no focal deficits noted.   Assessment & Plan  Vitamin D deficiency Adequately corrected on current supplement dose , continue same  Prediabetes  Deteriorated  Patient educated about the importance of limiting  Carbohydrate intake , the need to commit to daily physical activity for a minimum of 30 minutes , and to commit weight loss. The fact that changes in all these areas will reduce or eliminate all together the development of diabetes is stressed.      Latest Ref Rng & Units 12/05/2022    9:06 AM 11/21/2022    8:28 AM 08/02/2022   11:17 AM 05/14/2022    9:20 AM 03/08/2022    8:11 AM  Diabetic Labs  HbA1c 4.8 - 5.6 % 6.4    6.0    Chol 100 - 199 mg/dL    409    HDL >81 mg/dL    72    Calc LDL 0 - 99 mg/dL    78    Triglycerides 0 - 149 mg/dL    40    Creatinine 1.91 - 1.00 mg/dL  4.78  2.95  6.21  3.08       12/05/2022    9:01 AM 12/05/2022    8:21 AM 11/26/2022    8:13 AM 11/26/2022    6:49 AM 11/26/2022    6:47 AM 11/21/2022    8:26 AM 11/14/2022  7:59 AM  BP/Weight  Systolic BP 120 102 105 173  141   Diastolic BP 82 64 92 90  68   Wt. (Lbs)  348.04   344.8 344.7 344.7  BMI  65.76 kg/m2   65.15 kg/m2 65.13 kg/m2 65.13 kg/m2      10/12/2019    2:20 PM 07/31/2015    8:45 AM  Foot/eye exam completion dates  Foot Form Completion Done Done      Morbid obesity  Patient re-educated about  the importance of commitment to a  minimum of 150 minutes of exercise per week as able.  The importance of healthy food choices with portion control discussed, as well as eating regularly and within a 12 hour window most days. The need to choose "clean , green" food 50 to 75% of the time is discussed, as well as to make water the primary drink and set a goal of 64 ounces water daily.       12/05/2022    8:21 AM 11/26/2022    6:47 AM 11/21/2022    8:26 AM   Weight /BMI  Weight 348 lb 0.6 oz 344 lb 12.8 oz 344 lb 11.2 oz  Height 5\' 1"  (1.549 m) 5\' 1"  (1.549 m) 5\' 1"  (1.549 m)  BMI 65.76 kg/m2 65.15 kg/m2 65.13 kg/m2    Unchanged, phentermine prescribed, ha bariatric surgery planned  Essential hypertension Controlled, no change in medication DASH diet and commitment to daily physical activity for a minimum of 30 minutes discussed and encouraged, as a part of hypertension management. The importance of attaining a healthy weight is also discussed.     12/05/2022    9:01 AM 12/05/2022    8:21 AM 11/26/2022    8:13 AM 11/26/2022    6:49 AM 11/26/2022    6:47 AM 11/21/2022    8:26 AM 11/14/2022    7:59 AM  BP/Weight  Systolic BP 120 102 105 173  141   Diastolic BP 82 64 92 90  68   Wt. (Lbs)  348.04   344.8 344.7 344.7  BMI  65.76 kg/m2   65.15 kg/m2 65.13 kg/m2 65.13 kg/m2       Rheumatoid arthritis with negative rheumatoid factor (HCC) C/o chronic joint pain, improved on medication and managed by Rheumatology

## 2022-12-09 NOTE — Assessment & Plan Note (Addendum)
  Patient re-educated about  the importance of commitment to a  minimum of 150 minutes of exercise per week as able.  The importance of healthy food choices with portion control discussed, as well as eating regularly and within a 12 hour window most days. The need to choose "clean , green" food 50 to 75% of the time is discussed, as well as to make water the primary drink and set a goal of 64 ounces water daily.       12/05/2022    8:21 AM 11/26/2022    6:47 AM 11/21/2022    8:26 AM  Weight /BMI  Weight 348 lb 0.6 oz 344 lb 12.8 oz 344 lb 11.2 oz  Height 5\' 1"  (1.549 m) 5\' 1"  (1.549 m) 5\' 1"  (1.549 m)  BMI 65.76 kg/m2 65.15 kg/m2 65.13 kg/m2    Unchanged, phentermine prescribed, ha bariatric surgery planned

## 2022-12-09 NOTE — Assessment & Plan Note (Signed)
C/o chronic joint pain, improved on medication and managed by Rheumatology

## 2022-12-09 NOTE — Assessment & Plan Note (Signed)
  Deteriorated  Patient educated about the importance of limiting  Carbohydrate intake , the need to commit to daily physical activity for a minimum of 30 minutes , and to commit weight loss. The fact that changes in all these areas will reduce or eliminate all together the development of diabetes is stressed.      Latest Ref Rng & Units 12/05/2022    9:06 AM 11/21/2022    8:28 AM 08/02/2022   11:17 AM 05/14/2022    9:20 AM 03/08/2022    8:11 AM  Diabetic Labs  HbA1c 4.8 - 5.6 % 6.4    6.0    Chol 100 - 199 mg/dL    161    HDL >09 mg/dL    72    Calc LDL 0 - 99 mg/dL    78    Triglycerides 0 - 149 mg/dL    40    Creatinine 6.04 - 1.00 mg/dL  5.40  9.81  1.91  4.78       12/05/2022    9:01 AM 12/05/2022    8:21 AM 11/26/2022    8:13 AM 11/26/2022    6:49 AM 11/26/2022    6:47 AM 11/21/2022    8:26 AM 11/14/2022    7:59 AM  BP/Weight  Systolic BP 120 102 105 173  141   Diastolic BP 82 64 92 90  68   Wt. (Lbs)  348.04   344.8 344.7 344.7  BMI  65.76 kg/m2   65.15 kg/m2 65.13 kg/m2 65.13 kg/m2      10/12/2019    2:20 PM 07/31/2015    8:45 AM  Foot/eye exam completion dates  Foot Form Completion Done Done

## 2022-12-09 NOTE — Assessment & Plan Note (Signed)
Controlled, no change in medication DASH diet and commitment to daily physical activity for a minimum of 30 minutes discussed and encouraged, as a part of hypertension management. The importance of attaining a healthy weight is also discussed.     12/05/2022    9:01 AM 12/05/2022    8:21 AM 11/26/2022    8:13 AM 11/26/2022    6:49 AM 11/26/2022    6:47 AM 11/21/2022    8:26 AM 11/14/2022    7:59 AM  BP/Weight  Systolic BP 120 102 105 173  141   Diastolic BP 82 64 92 90  68   Wt. (Lbs)  348.04   344.8 344.7 344.7  BMI  65.76 kg/m2   65.15 kg/m2 65.13 kg/m2 65.13 kg/m2

## 2022-12-10 ENCOUNTER — Ambulatory Visit: Payer: Medicare PPO | Admitting: Dietician

## 2022-12-13 ENCOUNTER — Encounter (HOSPITAL_COMMUNITY)
Admission: RE | Disposition: A | Discharge status: Home / Self Care | Payer: Self-pay | Source: Home / Self Care | Attending: Gastroenterology

## 2022-12-13 ENCOUNTER — Encounter: Payer: Medicare PPO | Attending: Surgery | Admitting: Dietician

## 2022-12-13 ENCOUNTER — Ambulatory Visit (HOSPITAL_COMMUNITY)
Admission: RE | Admit: 2022-12-13 | Discharge: 2022-12-13 | Disposition: A | Discharge status: Home / Self Care | Payer: Medicare PPO | Attending: Gastroenterology | Admitting: Gastroenterology

## 2022-12-13 ENCOUNTER — Encounter: Payer: Self-pay | Admitting: Dietician

## 2022-12-13 ENCOUNTER — Other Ambulatory Visit: Payer: Self-pay | Admitting: *Deleted

## 2022-12-13 VITALS — Ht 61.0 in | Wt 354.8 lb

## 2022-12-13 DIAGNOSIS — E669 Obesity, unspecified: Secondary | ICD-10-CM | POA: Diagnosis not present

## 2022-12-13 DIAGNOSIS — Z9889 Other specified postprocedural states: Secondary | ICD-10-CM

## 2022-12-13 DIAGNOSIS — D500 Iron deficiency anemia secondary to blood loss (chronic): Secondary | ICD-10-CM

## 2022-12-13 DIAGNOSIS — Z713 Dietary counseling and surveillance: Secondary | ICD-10-CM | POA: Diagnosis not present

## 2022-12-13 DIAGNOSIS — Z79899 Other long term (current) drug therapy: Secondary | ICD-10-CM

## 2022-12-13 DIAGNOSIS — Z6841 Body Mass Index (BMI) 40.0 and over, adult: Secondary | ICD-10-CM | POA: Insufficient documentation

## 2022-12-13 DIAGNOSIS — D509 Iron deficiency anemia, unspecified: Secondary | ICD-10-CM | POA: Diagnosis not present

## 2022-12-13 HISTORY — PX: GIVENS CAPSULE STUDY: SHX5432

## 2022-12-13 SURGERY — IMAGING PROCEDURE, GI TRACT, INTRALUMINAL, VIA CAPSULE

## 2022-12-13 NOTE — Progress Notes (Signed)
Supervised Weight Loss Visit Bariatric Nutrition Education Appt Start Time: 8:59 am    End Time:  9:22 am  Planned Surgery: sleeve gastrectomy   Pt Expectation of Surgery/ Goals: Lose weight  3 out of 6 SWL Appointments   NUTRITION ASSESSMENT  Anthropometrics  Start weight at [facility]: 351.0 lbs (date: 09/24/2022)  Height: 61 in Weight today: 354.8 lb BMI: 67.04  Clinical   Pharmacotherapy: History of weight loss medication used: phentermine  Medical hx: obesity; HTN Medications: folic acid, vit D, phentermine, lasix, Maxzide, rinvoq Labs: B12 1,882; A1c 6.0; iron saturation 13;  Notable signs/symptoms: none noted Any previous deficiencies? No Allergies: penicillin, aleve (naproxen)  Lifestyle & Dietary Hx  Pt states the endoscopy  Pt states she had another fall, stating her right leg drags, and when that happens she states she falls. Pt states she is off phentermine right now, due to the testing she has been doing. Pt state she has not had sweets and has practiced not drinking with her meals, stating it is going well. Pt states she is working on waking to get on a regular schedule. Pt states she doesn't make sweet tea anymore, stating she got some at [employer] once since last visit.  Estimated daily fluid intake: almost 64 oz Supplements: Current average weekly physical activity: walking in place 15 minutes 3 days a week. Pt states she has been doing yoga in the chair (3 days a week) and standing in place exercise app for physical activity.  24-Hr Dietary Recall:11:30 home, wakes around 3:30 First Meal 11:30 (supposed to asleep): protein shake with a boiled egg sometimes or scrambled egg Snack:  Second Meal 3:30 pm: salad and chicken and fruit or peanut but and apple Snack:  Third Meal 7pm: tuna and fruit, cucumber salad Snack: 1 apple Beverages: water, half and half tea, water with flavorings  Estimated Energy Needs Calories: 1500-1700  NUTRITION DIAGNOSIS   Overweight/obesity (Wren-3.3) related to past poor dietary habits and physical inactivity as evidenced by patient w/ planned sleeve surgery following dietary guidelines for continued weight loss.  NUTRITION INTERVENTION  Nutrition counseling (C-1) and education (E-2) to facilitate bariatric surgery goals.  Purpose of protein: Every cell in your body has protein. Protein is essential for the structure, function and regulation of tissues and organs within the body. Without protein enzymes and antibodies would not exist, and cells would lack storage, transportation, and messenger systems. According to [employer], the body is made up of at least 10000 different proteins. Lack of protein can lead to growth failure in children, loss of muscle mass, decreased immune system function, and overall weakening of various organs in the body.  [employer].nl, DoubleProperty.com.cy, PokerProtocol.pl   Pre-Op Goals Progress & New Goals Completed: Find a protein shake you like Continue: Practice not drinking with meals or snacks Continue: Increase my usual daily activity (use stairs, park farther, etc.) Continue: add a fourth day of walking videos  Continue: Make time to get more sleep Continue: make meal ideas to meal prep snacks and meal work; including non-starchy vegetables, complex carbohydrates and a protein. Continue: include a protein with each meal or snack New: Track your protein  Handouts Provided Include  Protein Foods with value  Learning Style & Readiness for Change Teaching method utilized: Visual & Auditory  Demonstrated degree of understanding via: Teach Back  Readiness Level: action Barriers to learning/adherence to lifestyle change: Pt has a busy schedule which makes it difficult to dedicate time to  maintain personal goals.  RD's Notes for  next Visit  Assess pt's goals.   MONITORING & EVALUATION Dietary intake, weekly physical activity, body weight, and pre-op goals in 1 month.   Next Steps  Patient is to return to [facility] in one month for next SWL visit.

## 2022-12-14 LAB — CBC WITH DIFFERENTIAL/PLATELET
Absolute Monocytes: 604 {cells}/uL (ref 200–950)
Basophils Absolute: 28 {cells}/uL (ref 0–200)
Basophils Relative: 0.4 %
Eosinophils Absolute: 234 {cells}/uL (ref 15–500)
Eosinophils Relative: 3.3 %
HCT: 37.9 % (ref 35.0–45.0)
Hemoglobin: 11.2 g/dL — ABNORMAL LOW (ref 11.7–15.5)
Lymphs Abs: 2215 {cells}/uL (ref 850–3900)
MCH: 23.3 pg — ABNORMAL LOW (ref 27.0–33.0)
MCHC: 29.6 g/dL — ABNORMAL LOW (ref 32.0–36.0)
MCV: 78.8 fL — ABNORMAL LOW (ref 80.0–100.0)
MPV: 10.3 fL (ref 7.5–12.5)
Monocytes Relative: 8.5 %
Neutro Abs: 4019 {cells}/uL (ref 1500–7800)
Neutrophils Relative %: 56.6 %
Platelets: 419 10*3/uL — ABNORMAL HIGH (ref 140–400)
RBC: 4.81 10*6/uL (ref 3.80–5.10)
RDW: 13.9 % (ref 11.0–15.0)
Total Lymphocyte: 31.2 %
WBC: 7.1 10*3/uL (ref 3.8–10.8)

## 2022-12-14 LAB — COMPLETE METABOLIC PANEL WITH GFR
AG Ratio: 1 (calc) (ref 1.0–2.5)
ALT: 8 U/L (ref 6–29)
AST: 12 U/L (ref 10–35)
Albumin: 3.8 g/dL (ref 3.6–5.1)
Alkaline phosphatase (APISO): 94 U/L (ref 37–153)
BUN: 17 mg/dL (ref 7–25)
CO2: 29 mmol/L (ref 20–32)
Calcium: 9.4 mg/dL (ref 8.6–10.4)
Chloride: 103 mmol/L (ref 98–110)
Creat: 0.85 mg/dL (ref 0.50–1.05)
Globulin: 3.7 g/dL (ref 1.9–3.7)
Glucose, Bld: 100 mg/dL — ABNORMAL HIGH (ref 65–99)
Potassium: 4.4 mmol/L (ref 3.5–5.3)
Sodium: 138 mmol/L (ref 135–146)
Total Bilirubin: 0.7 mg/dL (ref 0.2–1.2)
Total Protein: 7.5 g/dL (ref 6.1–8.1)
eGFR: 78 mL/min/{1.73_m2} (ref >=60)

## 2022-12-15 NOTE — Progress Notes (Signed)
Hemoglobin and MCV are low and stable.  Patient should take multivitamin with iron.  CMP is normal.

## 2022-12-16 NOTE — Procedures (Signed)
Small Bowel Givens Capsule Study Procedure date: 12/13/2022  Referring Provider:  PCP PCP:  Dr. Kerri Perches, MD  Indication for procedure:  iron deficiency anemia  Findings:  Study was adequate as capsule reached the cecum and the bowel preparation was adequate in the small bowel. No abnormalities were found in the gastrointestinal lining.    First Gastric image:  00:01:00 First Duodenal image: 00:20:22 First Cecal image: 05:06:00 Gastric Passage time: 0h 44m Small Bowel Passage time:  4h 68m  Summary & Recommendations: Normal capsule endoscopy without presence of any abnormalities that would explain iron deficiency anemia.  As patient has had very mild anemia she should start taking oral iron supplementation.  She will start taking Flintstone multivitamin with iron.  She will follow-up with her PCP.  I personally communicated these recommendations to the patient  Katrinka Blazing, MD Gastroenterology and Hepatology [facility]

## 2022-12-17 ENCOUNTER — Encounter (HOSPITAL_COMMUNITY): Payer: Self-pay | Admitting: Gastroenterology

## 2022-12-22 ENCOUNTER — Other Ambulatory Visit: Payer: Self-pay | Admitting: Family Medicine
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
## 2023-01-09 ENCOUNTER — Ambulatory Visit: Payer: Medicare PPO

## 2023-01-09 ENCOUNTER — Ambulatory Visit: Payer: Medicare PPO | Attending: Rheumatology | Admitting: Rheumatology

## 2023-01-09 ENCOUNTER — Encounter: Payer: Self-pay | Admitting: Rheumatology

## 2023-01-09 VITALS — BP 113/74 | HR 60 | Resp 12 | Ht 61.0 in | Wt 352.4 lb

## 2023-01-09 DIAGNOSIS — E890 Postprocedural hypothyroidism: Secondary | ICD-10-CM

## 2023-01-09 DIAGNOSIS — M0609 Rheumatoid arthritis without rheumatoid factor, multiple sites: Secondary | ICD-10-CM

## 2023-01-09 DIAGNOSIS — Z8669 Personal history of other diseases of the nervous system and sense organs: Secondary | ICD-10-CM

## 2023-01-09 DIAGNOSIS — I10 Essential (primary) hypertension: Secondary | ICD-10-CM | POA: Diagnosis not present

## 2023-01-09 DIAGNOSIS — M19042 Primary osteoarthritis, left hand: Secondary | ICD-10-CM

## 2023-01-09 DIAGNOSIS — M19041 Primary osteoarthritis, right hand: Secondary | ICD-10-CM

## 2023-01-09 DIAGNOSIS — I5032 Chronic diastolic (congestive) heart failure: Secondary | ICD-10-CM

## 2023-01-09 DIAGNOSIS — M79642 Pain in left hand: Secondary | ICD-10-CM | POA: Diagnosis not present

## 2023-01-09 DIAGNOSIS — M19072 Primary osteoarthritis, left ankle and foot: Secondary | ICD-10-CM

## 2023-01-09 DIAGNOSIS — Z96653 Presence of artificial knee joint, bilateral: Secondary | ICD-10-CM | POA: Diagnosis not present

## 2023-01-09 DIAGNOSIS — E559 Vitamin D deficiency, unspecified: Secondary | ICD-10-CM

## 2023-01-09 DIAGNOSIS — M5030 Other cervical disc degeneration, unspecified cervical region: Secondary | ICD-10-CM | POA: Diagnosis not present

## 2023-01-09 DIAGNOSIS — Z8659 Personal history of other mental and behavioral disorders: Secondary | ICD-10-CM

## 2023-01-09 DIAGNOSIS — Z8261 Family history of arthritis: Secondary | ICD-10-CM

## 2023-01-09 DIAGNOSIS — M47816 Spondylosis without myelopathy or radiculopathy, lumbar region: Secondary | ICD-10-CM | POA: Diagnosis not present

## 2023-01-09 DIAGNOSIS — Z79899 Other long term (current) drug therapy: Secondary | ICD-10-CM

## 2023-01-09 DIAGNOSIS — M19071 Primary osteoarthritis, right ankle and foot: Secondary | ICD-10-CM | POA: Diagnosis not present

## 2023-01-09 DIAGNOSIS — Z8719 Personal history of other diseases of the digestive system: Secondary | ICD-10-CM

## 2023-01-09 DIAGNOSIS — R7303 Prediabetes: Secondary | ICD-10-CM

## 2023-01-09 MED ORDER — PREDNISONE 5 MG PO TABS: ORAL_TABLET | ORAL | 0 refills | Status: DC

## 2023-01-09 MED ORDER — METHOTREXATE SODIUM CHEMO INJECTION 50 MG/2ML: 20.0000 mg | INTRAMUSCULAR | 0 refills | Status: DC

## 2023-01-09 NOTE — Patient Instructions (Signed)
Standing Labs We placed an order today for your standing lab work.   Please have your standing labs drawn in December and every 3 months. TB Gold in December.  Please have your labs drawn 2 weeks prior to your appointment so that the provider can discuss your lab results at your appointment, if possible.  Please note that you may see your imaging and lab results in MyChart before we have reviewed them. We will contact you once all results are reviewed. Please allow our office up to 72 hours to thoroughly review all of the results before contacting the office for clarification of your results.  WALK-IN LAB HOURS  Monday through Thursday from 8:00 am -12:30 pm and 1:00 pm-5:00 pm and Friday from 8:00 am-12:00 pm.  Patients with office visits requiring labs will be seen before walk-in labs.  You may encounter longer than normal wait times. Please allow additional time. Wait times may be shorter on  Monday and Thursday afternoons.  We do not book appointments for walk-in labs. We appreciate your patience and understanding with our staff.   Labs are drawn by [facility]. Please bring your co-pay at the time of your lab draw.  You may receive a bill from [facility] for your lab work.  Please note if you are on Hydroxychloroquine and and an order has been placed for a Hydroxychloroquine level,  you will need to have it drawn 4 hours or more after your last dose.  If you wish to have your labs drawn at another location, please call the office 24 hours in advance so we can fax the orders.  The office is located at [address], [place], [place] [address]   If you have any questions regarding directions or hours of operation,  please call [phone].   As a reminder, please drink plenty of water prior to coming for your lab work. Thanks!   If you have signs or symptoms of an infection or start antibiotics: First, call your PCP for workup of your infection. Hold your medication through the  infection, until you complete your antibiotics, and until symptoms resolve if you take the following: Injectable medication (Actemra, Benlysta, Cimzia, Cosentyx, Enbrel, Humira, Kevzara, Orencia, Remicade, Simponi, Stelara, Taltz, Tremfya) Methotrexate Leflunomide (Arava) Mycophenolate (Cellcept) [name], Olumiant, or Rinvoq   Vaccines You are taking a medication(s) that can suppress your immune system.  The following immunizations are recommended: Flu annually Covid-19 RSV Td/Tdap (tetanus, diphtheria, pertussis) every 10 years Pneumonia (Prevnar 15 then Pneumovax 23 at least 1 year apart.  Alternatively, can take Prevnar 20 without needing additional dose) Shingrix: 2 doses from 4 weeks to 6 months apart  Please check with your PCP to make sure you are up to date.   Because you are taking [name], Rinvoq, or Olumiant, it is very important to know that this class of medications has a FDA BLACK BOX WARNING for major adverse cardiovascular events (MACE), thrombosis, mortality (including sudden cardiovascular death), serious infections, and lymphomas. MACE is defined as cardiovascular death, myocardial infarction, and stroke. Thrombosis includes deep venous thrombosis (DVT), pulmonary embolism (PE), and arterial thrombosis. If you are a current or former smoker, you are at higher risk for MACE.

## 2023-01-10 ENCOUNTER — Ambulatory Visit: Payer: Medicare PPO | Admitting: Dietician

## 2023-01-13 ENCOUNTER — Encounter: Payer: Self-pay | Admitting: Dietician

## 2023-01-13 ENCOUNTER — Encounter: Payer: Medicare PPO | Attending: Surgery | Admitting: Dietician

## 2023-01-13 VITALS — Ht 61.0 in | Wt 354.6 lb

## 2023-01-13 DIAGNOSIS — Z6841 Body Mass Index (BMI) 40.0 and over, adult: Secondary | ICD-10-CM | POA: Diagnosis not present

## 2023-01-13 DIAGNOSIS — Z713 Dietary counseling and surveillance: Secondary | ICD-10-CM | POA: Diagnosis not present

## 2023-01-13 DIAGNOSIS — E669 Obesity, unspecified: Secondary | ICD-10-CM | POA: Diagnosis present

## 2023-01-13 NOTE — Progress Notes (Signed)
Supervised Weight Loss Visit Bariatric Nutrition Education Appt Start Time: 8:06 am    End Time:  8:24 am  Planned Surgery: sleeve gastrectomy   Pt Expectation of Surgery/ Goals: Lose weight  4 out of 6 SWL Appointments  NUTRITION ASSESSMENT  Anthropometrics  Start weight at [facility]: 351.0 lbs (date: 09/24/2022)  Height: 61 in Weight today: 354.6 lb BMI: 67.00  Clinical  Medical hx: obesity; HTN Medications: folic acid, vit D, phentermine, lasix, Maxzide, rinvoq Labs: B12 1,882; A1c 6.0; iron saturation 13;  Notable signs/symptoms: none noted Any previous deficiencies? No Allergies: penicillin, aleve (naproxen)  Lifestyle & Dietary Hx Pt states she has been on prednisone off and on this month. Pt states she wants to try a [place] BBQ (shredded). Pt states she is up to 4 days a week with physical activity. Pt states she has been getting a lot of protein, stating she is not skipping meals and making sure she has a protein source with every meal.  Estimated daily fluid intake: 64 oz Supplements: Vit D Current average weekly physical activity: walking in place 15 minutes 4 days a week. Pt states she has been doing yoga in the chair (4 days a week) and standing in place exercise app for physical activity.  24-Hr Dietary Recall: First Meal 11:30 (supposed to asleep): protein shake with a boiled egg sometimes or scrambled egg Snack:  Second Meal 3:30 pm: salad and chicken and fruit or peanut but and apple Snack:  Third Meal 7pm: tuna and fruit, cucumber salad Snack: 1 apple Beverages: water, water with flavorings, Gatorade, Kool-aid  Estimated Energy Needs Calories: 1500-1700  NUTRITION DIAGNOSIS  Overweight/obesity ([place]-3.3) related to past poor dietary habits and physical inactivity as evidenced by patient w/ planned sleeve surgery following dietary guidelines for continued weight loss.  NUTRITION INTERVENTION  Nutrition counseling (C-1) and education (E-2) to facilitate  bariatric surgery goals.  Why you need complex carbohydrates: Whole grains and other complex carbohydrates are required to have a healthy diet. Whole grains provide fiber which can help with blood glucose levels and help keep you satiated. Fruits and starchy vegetables provide essential vitamins and minerals required for immune function, eyesight support, brain support, bone density, wound healing and many other functions within the body. According to the current evidenced based 2020-2025 Dietary Guidelines for Americans, complex carbohydrates are part of a healthy eating pattern which is associated with a decreased risk for type 2 diabetes, cancers, and cardiovascular disease.    Pre-Op Goals Progress & New Goals Completed: Find a protein shake you like Continue: Practice not drinking with meals or snacks Continue: Increase my usual daily activity (use stairs, park farther, etc.) Continue: add a fourth day of walking videos  Continue: Make time to get more sleep Continue: make meal ideas to meal prep snacks and meal work; including non-starchy vegetables, complex carbohydrates and a protein. Continue: include a protein with each meal or snack Re-engage/Continue: Track your protein New: avoid sugar sweetened beverages  Handouts Provided Include    Learning Style & Readiness for Change Teaching method utilized: Visual & Auditory  Demonstrated degree of understanding via: Teach Back  Readiness Level: preparation Barriers to learning/adherence to lifestyle change: Pt has a busy schedule which makes it difficult to dedicate time to maintain personal goals.  RD's Notes for next Visit  Assess pt's goals.   MONITORING & EVALUATION Dietary intake, weekly physical activity, body weight, and pre-op goals in 1 month.   Next Steps  Patient is to return  to [facility] in one month for next SWL visit.

## 2023-02-06 ENCOUNTER — Encounter (HOSPITAL_COMMUNITY): Payer: Self-pay | Admitting: Gastroenterology

## 2023-02-06 NOTE — Progress Notes (Signed)
Attempted to obtain medical history for pre op call via telephone, unable to reach at this time. HIPAA compliant voicemail message left requesting return call to pre surgical testing department.

## 2023-02-12 ENCOUNTER — Encounter: Payer: Medicare PPO | Attending: Surgery | Admitting: Dietician

## 2023-02-12 ENCOUNTER — Encounter: Payer: Self-pay | Admitting: Dietician

## 2023-02-12 VITALS — Ht 61.0 in | Wt 362.2 lb

## 2023-02-12 DIAGNOSIS — E669 Obesity, unspecified: Secondary | ICD-10-CM | POA: Insufficient documentation

## 2023-02-12 DIAGNOSIS — Z713 Dietary counseling and surveillance: Secondary | ICD-10-CM | POA: Diagnosis not present

## 2023-02-12 DIAGNOSIS — Z6841 Body Mass Index (BMI) 40.0 and over, adult: Secondary | ICD-10-CM | POA: Insufficient documentation

## 2023-02-12 NOTE — Progress Notes (Signed)
Supervised Weight Loss Visit Bariatric Nutrition Education Appt Start Time: 8:15 am    End Time:  8:24 am  Planned Surgery: sleeve gastrectomy   Pt Expectation of Surgery/ Goals: Lose weight 5 out of 6 SWL Appointments  NUTRITION ASSESSMENT  Anthropometrics  Start weight at [facility]: 351.0 lbs (date: 09/24/2022)  Height: 61 in Weight today: 362.2 lb BMI: 68.44  Clinical  Medical hx: obesity; HTN Medications: folic acid, vit D, phentermine, lasix, Maxzide, rinvoq Labs: B12 1,882; A1c 6.0; iron saturation 13;  Notable signs/symptoms: none noted Any previous deficiencies? No Allergies: penicillin, aleve (naproxen)  Lifestyle & Dietary Hx  Pt states she is having an endoscopy on Monday (Dec 2nd). Pt states she does not know what they are looking for. Pt arrived discouraged, stating she hasn't been able to take meds so she can have this procedure, stating she needs to be off meds and vitamins for 3 weeks. Pt states she is getting 60 grams of protein.  Estimated daily fluid intake: 64 oz Supplements: Vit D Current average weekly physical activity: walking in place 15 minutes 4 days a week. Pt states she has been doing yoga in the chair (4 days a week) and standing in place exercise app for physical activity.  24-Hr Dietary Recall: First Meal 11:30 (supposed to asleep): protein shake with a boiled egg sometimes or scrambled egg Snack:  Second Meal 3:30 pm: salad and chicken and fruit or peanut but and apple Snack:  Third Meal 7pm: tuna and fruit, cucumber salad Snack: 1 apple Beverages: water, water with flavorings, Gatorade, Kool-aid  Estimated Energy Needs Calories: 1500-1700  NUTRITION DIAGNOSIS  Overweight/obesity ([place]-3.3) related to past poor dietary habits and physical inactivity as evidenced by patient w/ planned sleeve surgery following dietary guidelines for continued weight loss.  NUTRITION INTERVENTION  Nutrition counseling (C-1) and education (E-2) to facilitate  bariatric surgery goals.  Encouraged pt to continue to eat balanced meals inclusive of non starchy vegetables 2 times a day 7 days a week Encouraged pt to choose lean protein sources: limiting beef, pork, sausage, hotdogs, and lunch meat Encourage pt to choose healthy fats such as plant based limiting animal fats Encouraged pt to continue to drink a minium 64 fluid ounces with half being plain water to satisfy proper hydration    Pre-Op Goals Progress & New Goals Completed: Find a protein shake you like Continue: Practice not drinking with meals or snacks Continue: Increase my usual daily activity (use stairs, park farther, etc.) Continue: add a fourth day of walking videos  Continue: Make time to get more sleep Continue: make meal ideas to meal prep snacks and meal work; including non-starchy vegetables, complex carbohydrates and a protein. Continue: include a protein with each meal or snack Continue: Track your protein Continue: avoid sugar sweetened beverages New: aim for 2 or more servings of non-starchy vegetables per day  Handouts Provided Include  Goals printed Meal Ideas  Learning Style & Readiness for Change Teaching method utilized: Visual & Auditory  Demonstrated degree of understanding via: Teach Back  Readiness Level: preparation Barriers to learning/adherence to lifestyle change: Pt has a busy schedule which makes it difficult to dedicate time to maintain personal goals.  RD's Notes for next Visit  Assess pt's goals.  MONITORING & EVALUATION Dietary intake, weekly physical activity, body weight, and pre-op goals in 1 month.   Next Steps  Patient is to return to [facility] in one month for next SWL visit.

## 2023-02-17 ENCOUNTER — Ambulatory Visit (HOSPITAL_COMMUNITY): Payer: Medicare PPO

## 2023-02-17 ENCOUNTER — Encounter (HOSPITAL_COMMUNITY)
Admission: RE | Disposition: A | Discharge status: Home / Self Care | Payer: Self-pay | Source: Home / Self Care | Attending: Gastroenterology

## 2023-02-17 ENCOUNTER — Encounter (HOSPITAL_COMMUNITY): Payer: Self-pay | Admitting: Gastroenterology

## 2023-02-17 ENCOUNTER — Ambulatory Visit (HOSPITAL_COMMUNITY)
Admission: RE | Admit: 2023-02-17 | Discharge: 2023-02-17 | Disposition: A | Discharge status: Home / Self Care | Payer: Medicare PPO | Attending: Gastroenterology | Admitting: Gastroenterology

## 2023-02-17 ENCOUNTER — Other Ambulatory Visit: Payer: Self-pay

## 2023-02-17 DIAGNOSIS — K2289 Other specified disease of esophagus: Secondary | ICD-10-CM

## 2023-02-17 DIAGNOSIS — K3189 Other diseases of stomach and duodenum: Secondary | ICD-10-CM

## 2023-02-17 DIAGNOSIS — I10 Essential (primary) hypertension: Secondary | ICD-10-CM | POA: Diagnosis not present

## 2023-02-17 DIAGNOSIS — I899 Noninfective disorder of lymphatic vessels and lymph nodes, unspecified: Secondary | ICD-10-CM

## 2023-02-17 DIAGNOSIS — Z833 Family history of diabetes mellitus: Secondary | ICD-10-CM | POA: Insufficient documentation

## 2023-02-17 DIAGNOSIS — F32A Depression, unspecified: Secondary | ICD-10-CM | POA: Diagnosis not present

## 2023-02-17 DIAGNOSIS — R519 Headache, unspecified: Secondary | ICD-10-CM | POA: Diagnosis not present

## 2023-02-17 DIAGNOSIS — M1711 Unilateral primary osteoarthritis, right knee: Secondary | ICD-10-CM | POA: Insufficient documentation

## 2023-02-17 DIAGNOSIS — K2970 Gastritis, unspecified, without bleeding: Secondary | ICD-10-CM

## 2023-02-17 DIAGNOSIS — M1990 Unspecified osteoarthritis, unspecified site: Secondary | ICD-10-CM | POA: Insufficient documentation

## 2023-02-17 DIAGNOSIS — E119 Type 2 diabetes mellitus without complications: Secondary | ICD-10-CM | POA: Insufficient documentation

## 2023-02-17 DIAGNOSIS — M069 Rheumatoid arthritis, unspecified: Secondary | ICD-10-CM | POA: Insufficient documentation

## 2023-02-17 DIAGNOSIS — K219 Gastro-esophageal reflux disease without esophagitis: Secondary | ICD-10-CM | POA: Diagnosis not present

## 2023-02-17 DIAGNOSIS — F419 Anxiety disorder, unspecified: Secondary | ICD-10-CM | POA: Diagnosis not present

## 2023-02-17 DIAGNOSIS — K229 Disease of esophagus, unspecified: Secondary | ICD-10-CM

## 2023-02-17 HISTORY — PX: ESOPHAGOGASTRODUODENOSCOPY (EGD) WITH PROPOFOL: SHX5813

## 2023-02-17 HISTORY — PX: BIOPSY: SHX5522

## 2023-02-17 HISTORY — PX: EUS: SHX5427

## 2023-02-17 SURGERY — UPPER ENDOSCOPIC ULTRASOUND (EUS) RADIAL
Anesthesia: Monitor Anesthesia Care

## 2023-02-17 MED ORDER — GLYCOPYRROLATE 0.2 MG/ML IJ SOLN
INTRAMUSCULAR | Status: DC | PRN
  Administered 2023-02-17: .2 mg via INTRAVENOUS

## 2023-02-17 MED ORDER — PROPOFOL 1000 MG/100ML IV EMUL
INTRAVENOUS | Status: AC
  Filled 2023-02-17: qty 100

## 2023-02-17 MED ORDER — PROPOFOL 500 MG/50ML IV EMUL
INTRAVENOUS | Status: DC | PRN
  Administered 2023-02-17: 150 ug/kg/min via INTRAVENOUS

## 2023-02-17 MED ORDER — SODIUM CHLORIDE 0.9 % IV SOLN: INTRAVENOUS | Status: DC

## 2023-02-17 MED ORDER — PROPOFOL 10 MG/ML IV BOLUS
INTRAVENOUS | Status: DC | PRN
  Administered 2023-02-17 (×2): 30 mg via INTRAVENOUS

## 2023-02-17 MED ORDER — LIDOCAINE HCL (CARDIAC) PF 100 MG/5ML IV SOSY
PREFILLED_SYRINGE | INTRAVENOUS | Status: DC | PRN
  Administered 2023-02-17: 100 mg via INTRAVENOUS

## 2023-02-17 MED ORDER — SODIUM CHLORIDE 0.9 % IV SOLN: INTRAVENOUS | Status: DC | PRN

## 2023-02-17 NOTE — Transfer of Care (Signed)
Immediate Anesthesia Transfer of Care Note  Patient: [name]  Procedure(s) Performed: UPPER ENDOSCOPIC ULTRASOUND (EUS) RADIAL ESOPHAGOGASTRODUODENOSCOPY (EGD) WITH PROPOFOL BIOPSY  Patient Location: PACU  Anesthesia Type:MAC  Level of Consciousness: awake, drowsy, and patient cooperative  Airway & Oxygen Therapy: Patient Spontanous Breathing and Patient connected to face mask oxygen  Post-op Assessment: Report given to RN and Post -op Vital signs reviewed and stable  Post vital signs: Reviewed and stable  Last Vitals:  Vitals Value Taken Time  BP    Temp    Pulse 76 02/17/23 0835  Resp 21 02/17/23 0835  SpO2 95 % 02/17/23 0835  Vitals shown include unfiled device data.  Last Pain:  Vitals:   02/17/23 0648  TempSrc: Temporal  PainSc: 0-No pain         Complications: No notable events documented.

## 2023-02-17 NOTE — Anesthesia Preprocedure Evaluation (Addendum)
Anesthesia Evaluation  Patient identified by MRN, date of birth, ID band Patient awake    Reviewed: Allergy & Precautions, NPO status , Patient's Chart, lab work & pertinent test results  Airway Mallampati: II  TM Distance: >3 FB Neck ROM: Full    Dental  (+) Chipped, Missing,    Pulmonary neg pulmonary ROS   Pulmonary exam normal        Cardiovascular hypertension, Normal cardiovascular exam     Neuro/Psych  Headaches PSYCHIATRIC DISORDERS Anxiety Depression     Neuromuscular disease    GI/Hepatic Neg liver ROS,GERD  Medicated and Controlled,,  Endo/Other  diabetes  Class 4 obesity  Renal/GU negative Renal ROS     Musculoskeletal  (+) Arthritis ,    Abdominal  (+) + obese  Peds  Hematology negative hematology ROS (+)   Anesthesia Other Findings esophageal nodule  Reproductive/Obstetrics                             Anesthesia Physical Anesthesia Plan  ASA: 4  Anesthesia Plan: MAC   Post-op Pain Management:    Induction: Intravenous  PONV Risk Score and Plan: 2 and Propofol infusion and Treatment may vary due to age or medical condition  Airway Management Planned: Nasal Cannula  Additional Equipment:   Intra-op Plan:   Post-operative Plan:   Informed Consent: I have reviewed the patients History and Physical, chart, labs and discussed the procedure including the risks, benefits and alternatives for the proposed anesthesia with the patient or authorized representative who has indicated his/her understanding and acceptance.     Dental advisory given  Plan Discussed with: CRNA  Anesthesia Plan Comments:        Anesthesia Quick Evaluation

## 2023-02-17 NOTE — H&P (Signed)
GASTROENTEROLOGY PROCEDURE H&P NOTE   Primary Care Physician: Kerri Perches, MD  HPI: [name] is a 62 y.o. female who presents for EGD/EUS to evaluate esophageal nodule.  Past Medical History:  Diagnosis Date   Allergy    Annual visit for general adult medical examination with abnormal findings 01/02/2021   Anxiety    Sometimes   Chronic back pain    Degenerative joint disease    Left TKA in 07/2009-Dr. 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 Lovell Sheehan   COLONOSCOPY N/A 03/03/2013   Procedure: COLONOSCOPY;  Surgeon: Malissa Hippo, MD;  Location: [facility];  Service: Endoscopy;  Laterality: N/A;  930   COLONOSCOPY WITH PROPOFOL N/A 11/26/2022   Procedure: COLONOSCOPY WITH PROPOFOL;  Surgeon: Dolores Frame, MD;  Location: [facility];  Service: Gastroenterology;  Laterality: N/A;  7:30 am, asa 3   CRANIECTOMY SUBOCCIPITAL W/ CERVICAL LAMINECTOMY / CHIARI  2007   Repair of Arnold-Chiari malformation    ESOPHAGOGASTRODUODENOSCOPY (EGD) WITH PROPOFOL N/A 11/26/2022   Procedure: ESOPHAGOGASTRODUODENOSCOPY (EGD) WITH PROPOFOL;  Surgeon: Dolores Frame, MD;  Location: [facility];  Service: Gastroenterology;  Laterality: N/A;   EYE SURGERY     bilateral cataract surgery   GIVENS CAPSULE STUDY N/A 12/13/2022   Procedure: GIVENS CAPSULE STUDY;  Surgeon: Dolores Frame, MD;  Location: [facility];  Service: Gastroenterology;  Laterality: N/A;  7:30am   JOINT REPLACEMENT Left 2011   KNEE ARTHROSCOPY  1995   Dr. Cleophas Dunker    THYROIDECTOMY, PARTIAL  2004   Left lobectomy - large benign nodule   TONSILLECTOMY     TOTAL KNEE ARTHROPLASTY  07/2009   Left knee , Dr. Dion Saucier    TOTAL KNEE ARTHROPLASTY Right 07/13/2013   Procedure: TOTAL KNEE ARTHROPLASTY;  Surgeon: Eulas Post, MD;  Location: [facility];  Service: Orthopedics;  Laterality: Right;   TUBAL LIGATION     Current Facility-Administered Medications  Medication Dose Route Frequency Provider Last Rate Last Admin   0.9 %  sodium chloride infusion   Intravenous Continuous Mansouraty, Netty Starring., MD        Current Facility-Administered Medications:    0.9 %  sodium chloride infusion, , Intravenous, Continuous, Mansouraty, Netty Starring., MD Allergies  Allergen Reactions   Aleve [Naproxen Sodium] Shortness Of Breath   Penicillins Swelling    .Has patient had a PCN reaction  causing immediate rash, facial/tongue/throat swelling, SOB or lightheadedness with hypotension: Yes Has patient had a PCN reaction causing severe rash involving mucus membranes or skin necrosis: No Has patient had a PCN reaction that required hospitalization: No Has patient had a PCN reaction occurring within the last 10 years: No If all of the above answers are "NO", then may proceed with Cephalosporin use.    Adhesive [Tape]     rash   Latex Rash   Family History  Problem Relation Age of Onset   Diabetes Mother    Depression Mother    Diabetes  Father        emphsema    Hyperlipidemia Father    Emphysema Father    Rheum arthritis Sister    Breast cancer Sister    Cancer Sister    Healthy Daughter    Healthy Daughter    Healthy Daughter    Colon cancer Neg Hx    Social History   Socioeconomic History   Marital status: Significant Other    Spouse name: Not on file   Number of children: 3   Years of education: 15   Highest education level: Some college, no degree  Occupational History   Occupation: Employed seeking disabilty 2012  Tobacco Use   Smoking status: Never    Passive exposure: Never   Smokeless tobacco: Never  Vaping Use   Vaping status: Never Used  Substance and Sexual Activity   Alcohol use: Not Currently   Drug use: No   Sexual activity: Yes    Birth control/protection: Condom, Surgical  Other Topics Concern   Not on file  Social History Narrative   Is in a new relationship    Social Determinants of Health   Financial Resource Strain: Medium Risk (09/04/2022)   Overall Financial Resource Strain (CARDIA)    Difficulty of Paying Living Expenses: Somewhat hard  Food Insecurity: No Food Insecurity (09/04/2022)   Hunger Vital Sign    Worried About Running Out of Food in the Last Year: Never true    Ran Out of Food in the Last Year: Never true  Transportation Needs: No Transportation Needs (09/04/2022)   PRAPARE - Administrator, Civil Service (Medical): No    Lack of Transportation (Non-Medical): No  Physical Activity: Insufficiently Active (09/04/2022)   Exercise Vital Sign    Days of Exercise per Week: 1 day    Minutes of Exercise per Session: 10 min  Stress: No Stress Concern Present (09/04/2022)   [employer] of Occupational Health - Occupational Stress Questionnaire    Feeling of Stress : Not at all  Social Connections: Moderately Integrated (09/04/2022)   Social Connection and Isolation Panel [NHANES]    Frequency of Communication with Friends and Family: More than three  times a week    Frequency of Social Gatherings with Friends and Family: Once a week    Attends Religious Services: More than 4 times per year    Active Member of Clubs or Organizations: Yes    Attends Banker Meetings: More than 4 times per year    Marital Status: Divorced  Intimate Partner Violence: Not At Risk (09/04/2022)   Humiliation, Afraid, Rape, and Kick questionnaire    Fear of Current or Ex-Partner: No    Emotionally Abused: No    Physically Abused: No    Sexually Abused: No    Physical Exam: Today's Vitals   02/06/23 1526 02/17/23 0648  BP:  (!) 157/74  Pulse:  66  Resp:  (!) 29  Temp:  98.1 F (36.7 C)  TempSrc:  Temporal  SpO2:  98%  Weight: (!) 160.8 kg (!) 163.7 kg  Height:  5\' 1"  (1.549 m)  PainSc:  0-No pain   Body mass index is 68.21 kg/m. GEN: NAD EYE: Sclerae anicteric ENT: MMM CV: Non-tachycardic GI: Soft, NT/ND NEURO:  Alert & Oriented x 3  Lab Results: No results for input(s): "WBC", "HGB", "HCT", "PLT" in the last 72 hours. BMET No results for input(s): "NA", "K", "CL", "CO2", "GLUCOSE", "BUN", "CREATININE", "CALCIUM" in the last 72 hours. LFT No results for input(s): "PROT", "ALBUMIN", "AST", "ALT", "ALKPHOS", "BILITOT", "BILIDIR", "IBILI" in the last 72 hours. PT/INR No results for input(s): "LABPROT", "INR" in the last 72 hours.   Impression / Plan: This is a 62 y.o.female who presents for EGD/EUS to evaluate esophageal nodule.  The risks of an EUS including intestinal perforation, bleeding, infection, aspiration, and medication effects were discussed as was the possibility it may not give a definitive diagnosis if a biopsy is performed.  When a biopsy of the pancreas is done as part of the EUS, there is an additional risk of pancreatitis at the rate of about 1-2%.  It was explained that procedure related pancreatitis is typically mild, although it can be severe and even life threatening, which is why we do not perform random  pancreatic biopsies and only biopsy a lesion/area we feel is concerning enough to warrant the risk.   The risks and benefits of endoscopic evaluation/treatment were discussed with the patient and/or family; these include but are not limited to the risk of perforation, infection, bleeding, missed lesions, lack of diagnosis, severe illness requiring hospitalization, as well as anesthesia and sedation related illnesses.  The patient's history has been reviewed, patient examined, no change in status, and deemed stable for procedure.  The patient and/or family is agreeable to proceed.    Corliss Parish, MD  Gastroenterology [facility] Endoscopy Office # [phone]

## 2023-02-17 NOTE — Anesthesia Postprocedure Evaluation (Signed)
Anesthesia Post Note  Patient: [name]  Procedure(s) Performed: UPPER ENDOSCOPIC ULTRASOUND (EUS) RADIAL ESOPHAGOGASTRODUODENOSCOPY (EGD) WITH PROPOFOL BIOPSY     Patient location during evaluation: Endoscopy Anesthesia Type: MAC Level of consciousness: awake Pain management: pain level controlled Vital Signs Assessment: post-procedure vital signs reviewed and stable Respiratory status: spontaneous breathing, nonlabored ventilation and respiratory function stable Cardiovascular status: blood pressure returned to baseline and stable Postop Assessment: no apparent nausea or vomiting Anesthetic complications: no   No notable events documented.  Last Vitals:  Vitals:   02/17/23 0850 02/17/23 0900  BP: (!) 145/97 (!) 141/76  Pulse: 85 (!) 58  Resp: 20 13  Temp:    SpO2: 97% 99%    Last Pain:  Vitals:   02/17/23 0900  TempSrc:   PainSc: 0-No pain                 Paizlie Klaus P Melba Araki

## 2023-02-17 NOTE — Op Note (Signed)
[facility] Patient Name: [name] Procedure Date: 02/17/2023 MRN: [id number] Attending MD: Corliss Parish , MD, [id number] Date of Birth: [birth year] CSN: [id number] Age: 62 Admit Type: Outpatient Procedure:                Upper EUS Indications:              Esophageal deformity on endoscopy/Subepithelial                            tumor vs. extrinsic compression Providers:                Corliss Parish, MD, Lorenza Evangelist, RN, [facility] Technician, Technician, Kandice Robinsons,                            Technician Referring MD:             Katrinka Blazing Medicines:                Monitored Anesthesia Care Complications:            No immediate complications. Estimated Blood Loss:     Estimated blood loss was minimal. Procedure:                Pre-Anesthesia Assessment:                           - Prior to the procedure, a History and Physical                            was performed, and patient medications and                            allergies were reviewed. The patient's tolerance of                            previous anesthesia was also reviewed. The risks                            and benefits of the procedure and the sedation                            options and risks were discussed with the patient.                            All questions were answered, and informed consent                            was obtained. Prior Anticoagulants: The patient has                            taken no anticoagulant or antiplatelet agents. ASA                            Grade Assessment: III -  A patient with severe                            systemic disease. After reviewing the risks and                            benefits, the patient was deemed in satisfactory                            condition to undergo the procedure.                           After obtaining informed consent, the endoscope was                             passed under direct vision. Throughout the                            procedure, the patient's blood pressure, pulse, and                            oxygen saturations were monitored continuously. The                            [id number]-[id number] ([id number]) Olympus endoscope was introduced                            through the mouth, and advanced to the second part                            of duodenum. The [id number]-[id number] ([id number]) Olympus                            radial ultrasound scope was introduced through the                            mouth, and advanced to the stomach for ultrasound                            examination from the esophagus and stomach. The                            upper EUS was accomplished without difficulty. The                            patient tolerated the procedure. Scope In: Scope Out: Findings:      ENDOSCOPIC FINDING: :      A single 6 mm subepithelial nodule was found in the distal esophagus, 35       cm from the incisors.      No other gross lesions were noted in the entire esophagus.      The Z-line was irregular and was found 39 cm from the incisors.      Patchy mildly erythematous mucosa without bleeding was found in the  entire examined stomach. Biopsies were taken with a cold forceps for       histology and Helicobacter pylori testing.      No gross lesions were noted in the duodenal bulb, in the first portion       of the duodenum and in the second portion of the duodenum.      ENDOSONOGRAPHIC FINDING: :      An oval intramural (subepithelial) lesion was found in the thoracic       esophagus. It was encountered at 34-35 cm from the incisors. The lesion       was hypoechoic. Sonographically, the origin appeared to be within the       muscularis propria (Layer 4). The mass measured 5 mm by 3 mm in maximal       cross-sectional diameter. The endosonographic borders were well-defined.      There was no other sign of significant endosonographic  abnormality in       the cervical esophagus, in the thoracic esophagus and in the       gastroesophageal junction. No masses, no cysts and no wall thickening       were identified.      Endosonographic imaging in the visualized portion of the liver showed no       mass.      No malignant-appearing lymph nodes were visualized in the middle       paraesophageal mediastinum (level 15M), lower paraesophageal mediastinum       (level 8L), diaphragmatic region (level 15) and paracardial region       (level 16).      The celiac region was visualized. Impression:               EGD impression:                           - Subepithelial nodule found in the distal                            esophagus.                           - No other gross lesions in the entire esophagus.                           - Z-line irregular, 39 cm from the incisors.                           - Erythematous mucosa in the stomach. Biopsied.                           - No gross lesions in the duodenal bulb, in the                            first portion of the duodenum and in the second                            portion of the duodenum.                           EUS impression:                           -  An intramural (subepithelial) lesion was found in                            the thoracic esophagus at 34-35 cm. It appeared to                            originate from within the muscularis propria (Layer                            4). Tissue has not been obtained. 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                           - There was no other sign of significant pathology                            in the cervical esophagus, in the thoracic                            esophagus and in the gastroesophageal junction.                            - No malignant-appearing lymph nodes were                            visualized in the middle paraesophageal mediastinum                            (level 7M), lower paraesophageal mediastinum (level                            8L), diaphragmatic region (level 15) and                            paracardial region (level 16). Moderate Sedation:      Not Applicable - Patient had care per Anesthesia. Recommendation:           - The patient will be observed post-procedure,                            until all discharge criteria are met.                           - Discharge patient to home.                           -  Patient has a contact number available for                            emergencies. The signs and symptoms of potential                            delayed complications were discussed with the                            patient. Return to normal activities tomorrow.                            Written discharge instructions were provided to the                            patient.                           - Resume previous diet.                           - Observe patient's clinical course.                           - Await path results.                           - Repeat the upper endoscopic ultrasound in 1 year                            for surveillance and if stable then likely proceed                            with 2-year surveillance thereafter.                           - The findings and recommendations were discussed                            with the patient.                           - The findings and recommendations were discussed                            with the patient's family. Procedure Code(s):        --- Professional ---                           [phone], Esophagogastroduodenoscopy, flexible,                            transoral; with endoscopic ultrasound examination                            limited to the esophagus, stomach or  duodenum, and  adjacent structures                           43239, Esophagogastroduodenoscopy, flexible,                            transoral; with biopsy, single or multiple Diagnosis Code(s):        --- Professional ---                           K22.89, Other specified disease of esophagus                           K31.89, Other diseases of stomach and duodenum                           I89.9, Noninfective disorder of lymphatic vessels                            and lymph nodes, unspecified CPT copyright 2022 American Medical Association. All rights reserved. The codes documented in this report are preliminary and upon coder review may  be revised to meet current compliance requirements. Corliss Parish, MD 02/17/2023 8:41:17 AM Number of Addenda: 0

## 2023-02-17 NOTE — Discharge Instructions (Signed)

## 2023-02-19 ENCOUNTER — Encounter (HOSPITAL_COMMUNITY): Payer: Self-pay | Admitting: Gastroenterology

## 2023-02-19 LAB — SURGICAL PATHOLOGY

## 2023-02-21 ENCOUNTER — Encounter: Payer: Self-pay | Admitting: Gastroenterology

## 2023-02-27 ENCOUNTER — Encounter: Payer: Self-pay | Admitting: Family Medicine

## 2023-02-27 ENCOUNTER — Encounter: Payer: Medicare PPO | Admitting: Family Medicine

## 2023-02-27 ENCOUNTER — Ambulatory Visit (INDEPENDENT_AMBULATORY_CARE_PROVIDER_SITE_OTHER): Payer: Medicare PPO | Admitting: Family Medicine

## 2023-02-27 VITALS — BP 135/80 | HR 62 | Ht 61.0 in | Wt 353.0 lb

## 2023-02-27 DIAGNOSIS — Z1231 Encounter for screening mammogram for malignant neoplasm of breast: Secondary | ICD-10-CM

## 2023-02-27 DIAGNOSIS — Z0001 Encounter for general adult medical examination with abnormal findings: Secondary | ICD-10-CM

## 2023-02-27 NOTE — Assessment & Plan Note (Signed)
Annual exam as documented. . Immunization and cancer screening needs are specifically addressed at this visit.  

## 2023-02-27 NOTE — Patient Instructions (Signed)
F/U I 8 to 10 weeks, call if you need me sooner  Please schedule mammogram at checkout this is past due  ALL the very best with upcoming surgery, next month, will clear you from a medical standpoint when request comes in  Careful , not to fall Think about what you will eat, plan ahead. Choose " clean, green, fresh or frozen" over canned, processed or packaged foods which are more sugary, salty and fatty. 70 to 75% of food eaten should be vegetables and fruit. Three meals at set times with snacks allowed between meals, but they must be fruit or vegetables. Aim to eat over a 12 hour period , example 7 am to 7 pm, and STOP after  your last meal of the day. Drink water,generally about 64 ounces per day, no other drink is as healthy. Fruit juice is best enjoyed in a healthy way, by EATING the fruit. Thanks for choosing [facility], we consider it a privelige to serve you.

## 2023-02-27 NOTE — Progress Notes (Signed)
    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 No other health concerns are expressed or addressed at the visit. Recent labs,  are reviewed. Immunization is reviewed , and  only needs covid vacine   PE: BP 135/80 (BP Location: Left Arm, Patient Position: Sitting, Cuff Size: Large)   Pulse 62   Ht 5\' 1"  (1.549 m)   Wt (!) 353 lb (160.1 kg)   SpO2 96%   BMI 66.70 kg/m   Pleasant  female, alert and oriented x 3, in no cardio-pulmonary distress. Afebrile. HEENT No facial trauma or asymetry. Sinuses non tender.  Extra occullar muscles intact.. External ears normal, . Neck: supple, no adenopathy,JVD or thyromegaly.No bruits.  Chest: Clear to ascultation bilaterally.No crackles or wheezes. Non tender to palpation  Breast: Not examined, mammogram to be scheduled at checkout past due an sibling dx with breast cancer in her 70/s  Cardiovascular system; Heart sounds normal,  S1 and  S2 ,no S3.  No murmur, or thrill. Peripheral pulses normal.  Abdomen: Soft, non tenderBowel sounds normal. No guarding, tenderness or rebound.     Musculoskeletal exam: Decreased  ROM of spine, hips , shoulders and knees. No deformity ,swelling or crepitus noted. No muscle wasting or atrophy.   Neurologic: Cranial nerves 2 to 12 intact. Power, tone ,sensation and reflexes normal throughout. disturbance in gait. No tremor.  Skin: Intact, no ulceration, erythema , scaling or rash noted. Pigmentation normal throughout  Psych; Normal mood and affect. Judgement and concentration normal   Assessment & Plan:  Annual visit for general adult  medical examination with abnormal findings Annual exam as documented.  Immunization and cancer screening needs are specifically addressed at this visit.

## 2023-03-10 ENCOUNTER — Other Ambulatory Visit: Payer: Self-pay | Admitting: *Deleted

## 2023-03-10 DIAGNOSIS — M0609 Rheumatoid arthritis without rheumatoid factor, multiple sites: Secondary | ICD-10-CM

## 2023-03-10 MED ORDER — RINVOQ 15 MG PO TB24: 15.0000 mg | ORAL_TABLET | Freq: Every day | ORAL | 0 refills | Status: DC

## 2023-03-10 NOTE — Telephone Encounter (Signed)
Refill request received via fax from [facility] for Rinvoq  Last Fill: 11/19/2022  Labs: 12/13/2022 Hemoglobin and MCV are low and stable.  CMP is normal.   TB Gold: 03/08/2022 Neg    Next Visit: 06/09/2023  Last Visit: 01/09/2023  DX: Rheumatoid arthritis of multiple sites with negative rheumatoid factor   Current Dose per office note 01/09/2023: Rinvoq 15 mg 1 tablet by mouth daily   Patient advised she is due to update her lab work.   Okay to refill Rinvoq?

## 2023-03-14 ENCOUNTER — Other Ambulatory Visit: Payer: Self-pay | Admitting: *Deleted

## 2023-03-14 ENCOUNTER — Encounter: Payer: Medicare PPO | Attending: Surgery | Admitting: Dietician

## 2023-03-14 ENCOUNTER — Encounter: Payer: Self-pay | Admitting: Dietician

## 2023-03-14 VITALS — Ht 61.0 in | Wt 363.2 lb

## 2023-03-14 DIAGNOSIS — E669 Obesity, unspecified: Secondary | ICD-10-CM | POA: Insufficient documentation

## 2023-03-14 DIAGNOSIS — Z79899 Other long term (current) drug therapy: Secondary | ICD-10-CM

## 2023-03-14 DIAGNOSIS — Z713 Dietary counseling and surveillance: Secondary | ICD-10-CM | POA: Insufficient documentation

## 2023-03-14 NOTE — Progress Notes (Signed)
Supervised Weight Loss Visit Bariatric Nutrition Education Appt Start Time: 0811    End Time:  0827  Planned Surgery: sleeve gastrectomy   Pt Expectation of Surgery/ Goals: Lose weight  6 out of 6 SWL Appointments  Pt completed visits.   Pt has cleared nutrition requirements.   NUTRITION ASSESSMENT  Anthropometrics  Start weight at [facility]: 351.0 lbs (date: 09/24/2022)  Height: 61 in Weight today: 363.2 lb BMI:   Clinical  Medical hx: obesity; HTN Medications: folic acid, vit D, phentermine, lasix, Maxzide, rinvoq Labs: B12 1,882; A1c 6.0; iron saturation 13;  Notable signs/symptoms: none noted Any previous deficiencies? No Allergies: penicillin, aleve (naproxen)  Lifestyle & Dietary Hx  Pt states she had the endoscopy done, stating she will have to do it again in a year. Pt states sometimes food burns going down. Pt states she has been doing well not drinking with her meals, and watching what she eats. Pt states the holidays has been out of the ordinary. 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 Pt states she has been eating breakfast instead of skipping. Pt states she needs to work more on types of snacks. Pt states she doesn't do well with raw vegetables. Pt states she was able to give up sugar and sweet tea.  Estimated daily fluid intake: 64 oz Supplements: Vit D Current average weekly physical activity: walking in place 15 minutes 4 days a week. Pt states she has been doing yoga in the chair (4 days a week) and standing in place exercise app for physical activity.  24-Hr Dietary Recall: First Meal 11:30 (supposed to asleep): protein shake with a boiled egg sometimes or scrambled egg Snack:  Second Meal 3:30 pm: salad and chicken and fruit or peanut but and apple Snack:  Third Meal 7pm: tuna and fruit, cucumber salad Snack: 1 apple Beverages: water, water with flavorings, Gatorade, Kool-aid  Estimated Energy Needs Calories:  1500-1700  NUTRITION DIAGNOSIS  Overweight/obesity (Arvin-3.3) related to past poor dietary habits and physical inactivity as evidenced by patient w/ planned sleeve surgery following dietary guidelines for continued weight loss.  NUTRITION INTERVENTION  Nutrition counseling (C-1) and education (E-2) to facilitate bariatric surgery goals.  Encouraged pt to continue to eat balanced meals inclusive of non starchy vegetables 2 times a day 7 days a week Encouraged pt to choose lean protein sources: limiting beef, pork, sausage, hotdogs, and lunch meat Encourage pt to choose healthy fats such as plant based limiting animal fats Encouraged pt to continue to drink a minium 64 fluid ounces with half being plain water to satisfy proper hydration   Encouraged patient to honor their body's internal hunger and fullness cues.  Throughout the day, check in mentally and rate hunger. Stop eating when satisfied not full regardless of how much food is left on the plate.  Get more if still hungry 20-30 minutes later.  The key is to honor satisfaction so throughout the meal, rate fullness factor and stop when comfortably satisfied not physically full. The key is to honor hunger and fullness without any feelings of guilt or shame.  Pay attention to what the internal cues are, rather than any external factors. This will enhance the confidence you have in listening to your own body and following those internal cues enabling you to increase how often you eat when you are hungry not out of appetite and stop when you are satisfied not full.    Pre-Op Goals Progress & New Goals Completed:  Find a protein shake you like Continue: Practice not drinking with meals or snacks Continue: Increase my usual daily activity (use stairs, park farther, etc.) Continue: add a fourth day of walking videos  Continue: Make time to get more sleep Continue: make meal ideas to meal prep snacks and meal work; including non-starchy vegetables,  complex carbohydrates and a protein. Continue: include a protein with each meal or snack Continue: Track your protein Continue: avoid sugar sweetened beverages Continue: aim for 2 or more servings of non-starchy vegetables per day  Handouts Provided Include    Learning Style & Readiness for Change Teaching method utilized: Visual & Auditory  Demonstrated degree of understanding via: Teach Back  Readiness Level: preparation Barriers to learning/adherence to lifestyle change: Pt has a busy schedule which makes it difficult to dedicate time to maintain personal goals.  RD's Notes for next Visit  Assess pt's goals.  MONITORING & EVALUATION Dietary intake, weekly physical activity, body weight, and pre-op goals in 1 month.   Next Steps  Pt has completed visits. No further supervised visits required/recommended. Patient is to return to [facility] for pre-op class >2 weeks prior to scheduled surgery.

## 2023-03-16 LAB — CBC WITH DIFFERENTIAL/PLATELET
Absolute Lymphocytes: 2462 {cells}/uL (ref 850–3900)
Absolute Monocytes: 540 {cells}/uL (ref 200–950)
Basophils Absolute: 43 {cells}/uL (ref 0–200)
Basophils Relative: 0.6 %
Eosinophils Absolute: 209 {cells}/uL (ref 15–500)
Eosinophils Relative: 2.9 %
HCT: 36.2 % (ref 35.0–45.0)
Hemoglobin: 11.1 g/dL — ABNORMAL LOW (ref 11.7–15.5)
MCH: 23.6 pg — ABNORMAL LOW (ref 27.0–33.0)
MCHC: 30.7 g/dL — ABNORMAL LOW (ref 32.0–36.0)
MCV: 76.9 fL — ABNORMAL LOW (ref 80.0–100.0)
MPV: 10.6 fL (ref 7.5–12.5)
Monocytes Relative: 7.5 %
Neutro Abs: 3946 {cells}/uL (ref 1500–7800)
Neutrophils Relative %: 54.8 %
Platelets: 390 10*3/uL (ref 140–400)
RBC: 4.71 10*6/uL (ref 3.80–5.10)
RDW: 14.3 % (ref 11.0–15.0)
Total Lymphocyte: 34.2 %
WBC: 7.2 10*3/uL (ref 3.8–10.8)

## 2023-03-16 LAB — COMPLETE METABOLIC PANEL WITH GFR
AG Ratio: 1 (calc) (ref 1.0–2.5)
ALT: 11 U/L (ref 6–29)
AST: 15 U/L (ref 10–35)
Albumin: 4 g/dL (ref 3.6–5.1)
Alkaline phosphatase (APISO): 103 U/L (ref 37–153)
BUN: 21 mg/dL (ref 7–25)
CO2: 26 mmol/L (ref 20–32)
Calcium: 9.1 mg/dL (ref 8.6–10.4)
Chloride: 103 mmol/L (ref 98–110)
Creat: 0.9 mg/dL (ref 0.50–1.05)
Globulin: 4.1 g/dL — ABNORMAL HIGH (ref 1.9–3.7)
Glucose, Bld: 97 mg/dL (ref 65–99)
Potassium: 4.7 mmol/L (ref 3.5–5.3)
Sodium: 139 mmol/L (ref 135–146)
Total Bilirubin: 0.4 mg/dL (ref 0.2–1.2)
Total Protein: 8.1 g/dL (ref 6.1–8.1)
eGFR: 73 mL/min/{1.73_m2} (ref >=60)

## 2023-03-16 LAB — QUANTIFERON-TB GOLD PLUS
Mitogen-NIL: >10 [IU]/mL
NIL: 0.04 [IU]/mL
QuantiFERON-TB Gold Plus: NEGATIVE
TB1-NIL: 0.01 [IU]/mL
TB2-NIL: 0.01 [IU]/mL

## 2023-03-17 NOTE — Progress Notes (Signed)
Hemoglobin is lower at 11.1 and stable.  CMP is normal.  TB Gold is negative.  Patient should take multivitamin with iron.  Please forward results to her PCP for the evaluation of anemia.

## 2023-04-01 ENCOUNTER — Telehealth: Payer: Self-pay | Admitting: *Deleted

## 2023-04-01 NOTE — Telephone Encounter (Signed)
 Patient left message to send RX for Rinvoq to [facility], patient states she has Medicare Part B and uses [facility]. Please advise.

## 2023-04-02 ENCOUNTER — Other Ambulatory Visit (HOSPITAL_COMMUNITY): Payer: Self-pay

## 2023-04-02 ENCOUNTER — Other Ambulatory Visit (HOSPITAL_COMMUNITY): Payer: Self-pay | Admitting: Family Medicine

## 2023-04-02 ENCOUNTER — Telehealth: Payer: Self-pay | Admitting: Pharmacist

## 2023-04-02 DIAGNOSIS — M0609 Rheumatoid arthritis without rheumatoid factor, multiple sites: Secondary | ICD-10-CM

## 2023-04-02 DIAGNOSIS — Z1231 Encounter for screening mammogram for malignant neoplasm of breast: Secondary | ICD-10-CM

## 2023-04-02 NOTE — Telephone Encounter (Signed)
 Returned call to patient- she was advised by [facility] that she needs to go through insurance. Unable to run test claim because PA is required. We'll restart BIV. [facility] PAP app for Rinvoq  mailed to patient. She is aware  Geraldene Kleine, PharmD, MPH, BCPS, CPP Clinical Pharmacist (Rheumatology and Pulmonology)

## 2023-04-02 NOTE — Telephone Encounter (Signed)
 Submitted an URGENT Prior Authorization request to [employer] for RINVOQ  via CoverMyMeds. Will update once we receive a response.  Key: [id number]   Rinvoq  PAP renewal mailed to patient's home. Provider form placed in Dr. Jory Ng folder for signature  Geraldene Kleine, PharmD, MPH, BCPS, CPP Clinical Pharmacist (Rheumatology and Pulmonology)

## 2023-04-03 ENCOUNTER — Other Ambulatory Visit (HOSPITAL_COMMUNITY): Payer: Self-pay

## 2023-04-03 NOTE — Telephone Encounter (Signed)
Received notification from [facility] regarding a prior authorization for [facility]. Authorization has been APPROVED from 04/02/2023 to 03/17/2024. Approval letter sent to scan center.  Per test claim, copay for 30 days supply is $2000  Will need to move forward with patient assistance program. Spoke with patient. She will complete application and return to clinic  Faxed provider form, med, PA approval letter, and insurance card copy to Dorris Singh, PharmD, MPH, BCPS, CPP Clinical Pharmacist (Rheumatology and Pulmonology)

## 2023-04-04 ENCOUNTER — Ambulatory Visit (HOSPITAL_COMMUNITY)
Admission: RE | Admit: 2023-04-04 | Discharge: 2023-04-04 | Disposition: A | Discharge status: Home / Self Care | Payer: Medicare PPO | Source: Ambulatory Visit | Attending: Family Medicine | Admitting: Family Medicine

## 2023-04-04 ENCOUNTER — Encounter (HOSPITAL_COMMUNITY): Payer: Self-pay

## 2023-04-04 DIAGNOSIS — Z1231 Encounter for screening mammogram for malignant neoplasm of breast: Secondary | ICD-10-CM | POA: Insufficient documentation

## 2023-04-21 ENCOUNTER — Encounter: Payer: Self-pay | Admitting: Skilled Nursing Facility1

## 2023-04-21 ENCOUNTER — Encounter: Payer: Medicare PPO | Attending: Surgery | Admitting: Skilled Nursing Facility1

## 2023-04-21 DIAGNOSIS — Z713 Dietary counseling and surveillance: Secondary | ICD-10-CM | POA: Diagnosis not present

## 2023-04-21 DIAGNOSIS — Z6841 Body Mass Index (BMI) 40.0 and over, adult: Secondary | ICD-10-CM | POA: Insufficient documentation

## 2023-04-21 NOTE — Telephone Encounter (Signed)
Received signed provider form  Submitted Patient Assistance RENEWAL Application to [facility] for [facility] along with provider portion, patient portion, PA, medication list, insurance card copy. Will update patient when we receive a response.  Phone: [phone] Fax: [phone]

## 2023-04-21 NOTE — Progress Notes (Signed)
Pre-Operative Nutrition Class:    Patient was seen on 04/21/2023 for Pre-Operative Bariatric Surgery Education at [facility].    Anthropometrics  Start weight at [facility]: 351.0 lbs (date: 09/24/2022)  Height: 61 in Weight today: 359.4 lb  Clinical  Medical hx: obesity; HTN Medications: folic acid, vit D, phentermine, lasix, Maxzide, rinvoq Labs: B12 1,882; A1c 6.0; iron saturation 13;  Notable signs/symptoms: none noted Any previous deficiencies? No Allergies: penicillin, aleve (naproxen)  Samples given per MNT protocol. Patient educated on appropriate usage:  Celebrate Multivitamin Lot # [id number] Exp: 10/2023   Calcium  Lot # [id number] Exp: 07/2023  Ensure Shake Lot # [id number] Exp: 12/17/2023  The following the learning objectives were met by the patient during this course: Identify Pre-Op Dietary Goals and will begin 2 weeks pre-operatively Identify appropriate sources of fluids and proteins  State protein recommendations and appropriate sources pre and post-operatively Identify Post-Operative Dietary Goals and will follow for 2 weeks post-operatively Identify appropriate multivitamin and calcium sources Describe the need for physical activity post-operatively and will follow MD recommendations State when to call healthcare provider regarding medication questions or post-operative complications When having a diagnosis of diabetes understanding hypoglycemia symptoms and the inclusion of 1 complex carbohydrate per meal  Handouts given during class include: Pre-Op Bariatric Surgery Diet Handout Protein Shake Handout Post-Op Bariatric Surgery Nutrition Handout BELT Program Information Flyer Support Group Information Flyer [facility] Bariatric Supplements Price List  Follow-Up Plan: Patient will follow-up at [facility] 2 weeks post operatively for diet advancement per MD.

## 2023-04-29 ENCOUNTER — Ambulatory Visit: Payer: Medicare PPO | Admitting: Family Medicine

## 2023-05-01 ENCOUNTER — Ambulatory Visit: Payer: Self-pay | Admitting: Surgery

## 2023-05-01 NOTE — Progress Notes (Signed)
Sent message, via epic in basket, requesting orders in epic from [facility].

## 2023-05-02 NOTE — Patient Instructions (Addendum)
SURGICAL WAITING ROOM VISITATION Patients having surgery or a procedure may have no more than 2 support people in the waiting area - these visitors may rotate.    Children under the age of 96 must have an adult with them who is not the patient.  Due to an increase in RSV and influenza rates and associated hospitalizations, children ages 62 and under may not visit patients in [facility] hospitals.   If the patient needs to stay at the hospital during part of their recovery, the visitor guidelines for inpatient rooms apply. Pre-op nurse will coordinate an appropriate time for 1 support person to accompany patient in pre-op.  This support person may not rotate.    Please refer to the [facility] website for the visitor guidelines for Inpatients (after your surgery is over and you are in a regular room).       Your procedure is scheduled on: 05-13-23   Report to [facility] Main Entrance    Report to admitting at 7:15 AM   Call this number if you have problems the morning of surgery [phone]   Do not eat food :After 6:00 PM the night before surgery .   After Midnight you may have the following liquids until 6:30 AM DAY OF SURGERY  Water Non-Citrus Juices (without pulp, NO RED-Apple, White grape, White cranberry) Black Coffee (NO MILK/CREAM OR CREAMERS, sugar ok)  Clear Tea (NO MILK/CREAM OR CREAMERS, sugar ok) regular and decaf                             Plain Jell-O (NO RED)                                           Fruit ices (not with fruit pulp, NO RED)                                     Popsicles (NO RED)                                                               Sports drinks like Gatorade (NO RED)                       If you have questions, please contact your surgeon's office.   FOLLOW  ANY ADDITIONAL PRE OP INSTRUCTIONS YOU RECEIVED FROM YOUR SURGEON'S OFFICE!!!     Oral Hygiene is also important to reduce your risk of infection.                                     Remember - BRUSH YOUR TEETH THE MORNING OF SURGERY WITH YOUR REGULAR TOOTHPASTE   Do NOT smoke after Midnight   Take these medicines the morning of surgery with A SIP OF WATER:    None  Stop all vitamins and herbal supplements 7 days before surgery  You may not have any metal on your body including hair pins, jewelry, and body piercing             Do not wear make-up, lotions, powders, perfumes, or deodorant  Do not wear nail polish including gel and S&S, artificial/acrylic nails, or any other type of covering on natural nails including finger and toenails. If you have artificial nails, gel coating, etc. that needs to be removed by a nail salon please have this removed prior to surgery or surgery may need to be canceled/ delayed if the surgeon/ anesthesia feels like they are unable to be safely monitored.   Do not shave  48 hours prior to surgery.    Do not bring valuables to the hospital. [facility] IS NOT RESPONSIBLE   FOR VALUABLES.   Contacts, dentures or bridgework may not be worn into surgery.   Bring small overnight bag day of surgery.   DO NOT BRING YOUR HOME MEDICATIONS TO THE HOSPITAL. PHARMACY WILL DISPENSE MEDICATIONS LISTED ON YOUR MEDICATION LIST TO YOU DURING YOUR ADMISSION IN THE HOSPITAL!               Please read over the following fact sheets you were given: IF YOU HAVE QUESTIONS ABOUT YOUR PRE-OP INSTRUCTIONS PLEASE CALL [phone] Gwen  If you received a COVID test during your pre-op visit  it is requested that you wear a mask when out in public, stay away from anyone that may not be feeling well and notify your surgeon if you develop symptoms. If you test positive for Covid or have been in contact with anyone that has tested positive in the last 10 days please notify you surgeon.  [facility] - Preparing for Surgery Before surgery, you can play an important role.  Because skin is not sterile, your skin needs to be as free  of germs as possible.  You can reduce the number of germs on your skin by washing with CHG (chlorahexidine gluconate) soap before surgery.  CHG is an antiseptic cleaner which kills germs and bonds with the skin to continue killing germs even after washing. Please DO NOT use if you have an allergy to CHG or antibacterial soaps.  If your skin becomes reddened/irritated stop using the CHG and inform your nurse when you arrive at Short Stay. Do not shave (including legs and underarms) for at least 48 hours prior to the first CHG shower.  You may shave your face/neck.  Please follow these instructions carefully:  1.  Shower with CHG Soap the night before surgery and the  morning of surgery.  2.  If you choose to wash your hair, wash your hair first as usual with your normal  shampoo.  3.  After you shampoo, rinse your hair and body thoroughly to remove the shampoo.                             4.  Use CHG as you would any other liquid soap.  You can apply chg directly to the skin and wash.  Gently with a scrungie or clean washcloth.  5.  Apply the CHG Soap to your body ONLY FROM THE NECK DOWN.   Do   not use on face/ open                           Wound or open sores. Avoid contact with eyes, ears  mouth and   genitals (private parts).                       Wash face,  Genitals (private parts) with your normal soap.             6.  Wash thoroughly, paying special attention to the area where your    surgery  will be performed.  7.  Thoroughly rinse your body with warm water from the neck down.  8.  DO NOT shower/wash with your normal soap after using and rinsing off the CHG Soap.                9.  Pat yourself dry with a clean towel.            10.  Wear clean pajamas.            11.  Place clean sheets on your bed the night of your first shower and do not  sleep with pets. Day of Surgery : Do not apply any lotions/deodorants the morning of surgery.  Please wear clean clothes to the hospital/surgery  center.  FAILURE TO FOLLOW THESE INSTRUCTIONS MAY RESULT IN THE CANCELLATION OF YOUR SURGERY  PATIENT SIGNATURE_________________________________  NURSE SIGNATURE__________________________________  ________________________________________________________________________   WHAT IS A BLOOD TRANSFUSION? Blood Transfusion Information  A transfusion is the replacement of blood or some of its parts. Blood is made up of multiple cells which provide different functions. Red blood cells carry oxygen and are used for blood loss replacement. White blood cells fight against infection. Platelets control bleeding. Plasma helps clot blood. Other blood products are available for specialized needs, such as hemophilia or other clotting disorders. BEFORE THE TRANSFUSION  Who gives blood for transfusions?  Healthy volunteers who are fully evaluated to make sure their blood is safe. This is blood bank blood. Transfusion therapy is the safest it has ever been in the practice of medicine. Before blood is taken from a donor, a complete history is taken to make sure that person has no history of diseases nor engages in risky social behavior (examples are intravenous drug use or sexual activity with multiple partners). The donor's travel history is screened to minimize risk of transmitting infections, such as malaria. The donated blood is tested for signs of infectious diseases, such as HIV and hepatitis. The blood is then tested to be sure it is compatible with you in order to minimize the chance of a transfusion reaction. If you or a relative donates blood, this is often done in anticipation of surgery and is not appropriate for emergency situations. It takes many days to process the donated blood. RISKS AND COMPLICATIONS Although transfusion therapy is very safe and saves many lives, the main dangers of transfusion include:  Getting an infectious disease. Developing a transfusion reaction. This is an allergic  reaction to something in the blood you were given. Every precaution is taken to prevent this. The decision to have a blood transfusion has been considered carefully by your caregiver before blood is given. Blood is not given unless the benefits outweigh the risks. AFTER THE TRANSFUSION Right after receiving a blood transfusion, you will usually feel much better and more energetic. This is especially true if your red blood cells have gotten low (anemic). The transfusion raises the level of the red blood cells which carry oxygen, and this usually causes an energy increase. The nurse administering the transfusion will monitor you carefully for complications. HOME CARE  INSTRUCTIONS  No special instructions are needed after a transfusion. You may find your energy is better. Speak with your caregiver about any limitations on activity for underlying diseases you may have. SEEK MEDICAL CARE IF:  Your condition is not improving after your transfusion. You develop redness or irritation at the intravenous (IV) site. SEEK IMMEDIATE MEDICAL CARE IF:  Any of the following symptoms occur over the next 12 hours: Shaking chills. You have a temperature by mouth above 102 F (38.9 C), not controlled by medicine. Chest, back, or muscle pain. People around you feel you are not acting correctly or are confused. Shortness of breath or difficulty breathing. Dizziness and fainting. You get a rash or develop hives. You have a decrease in urine output. Your urine turns a dark color or changes to pink, red, or brown. Any of the following symptoms occur over the next 10 days: You have a temperature by mouth above 102 F (38.9 C), not controlled by medicine. Shortness of breath. Weakness after normal activity. The white part of the eye turns yellow (jaundice). You have a decrease in the amount of urine or are urinating less often. Your urine turns a dark color or changes to pink, red, or brown. Document Released:  03/01/2000 Document Revised: 05/27/2011 Document Reviewed: 10/19/2007 Exit[employer] Patient Information 2014 [place], [place].  _______________________________________________________________________    Incentive Spirometer  An incentive spirometer is a tool that can help keep your lungs clear and active. This tool measures how well you are filling your lungs with each breath. Taking long deep breaths may help reverse or decrease the chance of developing breathing (pulmonary) problems (especially infection) following: A long period of time when you are unable to move or be active. BEFORE THE PROCEDURE  If the spirometer includes an indicator to show your best effort, your nurse or respiratory therapist will set it to a desired goal. If possible, sit up straight or lean slightly forward. Try not to slouch. Hold the incentive spirometer in an upright position. INSTRUCTIONS FOR USE  Sit on the edge of your bed if possible, or sit up as far as you can in bed or on a chair. Hold the incentive spirometer in an upright position. Breathe out normally. Place the mouthpiece in your mouth and seal your lips tightly around it. Breathe in slowly and as deeply as possible, raising the piston or the ball toward the top of the column. Hold your breath for 3-5 seconds or for as long as possible. Allow the piston or ball to fall to the bottom of the column. Remove the mouthpiece from your mouth and breathe out normally. Rest for a few seconds and repeat Steps 1 through 7 at least 10 times every 1-2 hours when you are awake. Take your time and take a few normal breaths between deep breaths. The spirometer may include an indicator to show your best effort. Use the indicator as a goal to work toward during each repetition. After each set of 10 deep breaths, practice coughing to be sure your lungs are clear. If you have an incision (the cut made at the time of surgery), support your incision when coughing by placing a  pillow or rolled up towels firmly against it. Once you are able to get out of bed, walk around indoors and cough well. You may stop using the incentive spirometer when instructed by your caregiver.  RISKS AND COMPLICATIONS Take your time so you do not get dizzy or light-headed. If you are in pain,  you may need to take or ask for pain medication before doing incentive spirometry. It is harder to take a deep breath if you are having pain. AFTER USE Rest and breathe slowly and easily. It can be helpful to keep track of a log of your progress. Your caregiver can provide you with a simple table to help with this. If you are using the spirometer at home, follow these instructions: SEEK MEDICAL CARE IF:  You are having difficultly using the spirometer. You have trouble using the spirometer as often as instructed. Your pain medication is not giving enough relief while using the spirometer. You develop fever of 100.5 F (38.1 C) or higher. SEEK IMMEDIATE MEDICAL CARE IF:  You cough up bloody sputum that had not been present before. You develop fever of 102 F (38.9 C) or greater. You develop worsening pain at or near the incision site. MAKE SURE YOU:  Understand these instructions. Will watch your condition. Will get help right away if you are not doing well or get worse. Document Released: 07/15/2006 Document Revised: 05/27/2011 Document Reviewed: 09/15/2006 [facility] Patient Information 2014 [place], [place].   ________________________________________________________________________

## 2023-05-02 NOTE — Progress Notes (Addendum)
COVID Vaccine Completed:  Yes  Date of COVID positive in last 90 days:  No  PCP - [name], MD Cardiologist - Dina Rich, MD last OV 2021 for palpitations  Chest x-ray - 09-06-22 Epic EKG - 09-06-22 Epic Stress Test - N/A ECHO - 05-03-15 Epic Cardiac Cath - N/A Pacemaker/ICD device last checked: Spinal Cord Stimulator: N/A Non-telemetry monitoring - 05-01-15 Epic  Bowel Prep - N/A  Sleep Study - Yes, neg sleep apnea CPAP - No  Prediabetes Fasting Blood Sugar -  Checks Blood Sugar - does not check   Last dose of GLP1 agonist-  N/A GLP1 instructions:  Hold 7 days before surgery    Last dose of SGLT-2 inhibitors-  N/A SGLT-2 instructions:  Hold 3 days before surgery    Blood Thinner Instructions:  N/A Last dose:   Time: Aspirin Instructions: Last Dose:  Activity level:  Can go up a flight of stairs and perform activities of daily living without stopping and without symptoms of chest pain or shortness of breath.  Anesthesia review:   N/A  Patient denies shortness of breath, fever, cough and chest pain at PAT appointment  Patient verbalized understanding of instructions that were given to them at the PAT appointment. Patient was also instructed that they will need to review over the PAT instructions again at home before surgery.

## 2023-05-05 NOTE — Telephone Encounter (Deleted)
Appears that GI is now changing patient's Cimzia dosing to more frequency to every 3 weeks which will require new authorization  Chesley Mires, PharmD, MPH, BCPS, CPP Clinical Pharmacist (Rheumatology and Pulmonology)

## 2023-05-06 ENCOUNTER — Other Ambulatory Visit: Payer: Self-pay

## 2023-05-06 ENCOUNTER — Encounter (HOSPITAL_COMMUNITY)
Admission: RE | Admit: 2023-05-06 | Discharge: 2023-05-06 | Disposition: A | Discharge status: Home / Self Care | Payer: Medicare PPO | Source: Ambulatory Visit | Attending: Surgery | Admitting: Surgery

## 2023-05-06 ENCOUNTER — Encounter (HOSPITAL_COMMUNITY): Payer: Self-pay

## 2023-05-06 VITALS — BP 148/78 | HR 63 | Temp 98.4°F | Resp 16 | Ht 61.0 in | Wt 351.4 lb

## 2023-05-06 DIAGNOSIS — I10 Essential (primary) hypertension: Secondary | ICD-10-CM | POA: Insufficient documentation

## 2023-05-06 DIAGNOSIS — Z01818 Encounter for other preprocedural examination: Secondary | ICD-10-CM

## 2023-05-06 DIAGNOSIS — Z01812 Encounter for preprocedural laboratory examination: Secondary | ICD-10-CM | POA: Insufficient documentation

## 2023-05-06 HISTORY — DX: Prediabetes: R73.03

## 2023-05-06 HISTORY — DX: Anemia, unspecified: D64.9

## 2023-05-06 LAB — CBC
HCT: 40.3 % (ref 36.0–46.0)
Hemoglobin: 11.8 g/dL — ABNORMAL LOW (ref 12.0–15.0)
MCH: 23.4 pg — ABNORMAL LOW (ref 26.0–34.0)
MCHC: 29.3 g/dL — ABNORMAL LOW (ref 30.0–36.0)
MCV: 79.8 fL — ABNORMAL LOW (ref 80.0–100.0)
Platelets: 372 10*3/uL (ref 150–400)
RBC: 5.05 MIL/uL (ref 3.87–5.11)
RDW: 15.2 % (ref 11.5–15.5)
WBC: 8.6 10*3/uL (ref 4.0–10.5)
nRBC: 0 % (ref 0.0–0.2)

## 2023-05-06 LAB — BASIC METABOLIC PANEL
Anion gap: 11 (ref 5–15)
BUN: 18 mg/dL (ref 6–20)
CO2: 24 mmol/L (ref 22–32)
Calcium: 9.2 mg/dL (ref 8.9–10.3)
Chloride: 101 mmol/L (ref 98–111)
Creatinine, Ser: 0.79 mg/dL (ref 0.44–1.00)
GFR, Estimated: >60 mL/min (ref >60)
Glucose, Bld: 99 mg/dL (ref 70–99)
Potassium: 4.3 mmol/L (ref 3.5–5.1)
Sodium: 136 mmol/L (ref 135–145)

## 2023-05-06 NOTE — Telephone Encounter (Signed)
Called [employer] for update, they are still working on Rinvoq PAP renewal. Rep does not see any information missing for first review.  Chesley Mires, PharmD, MPH, BCPS, CPP Clinical Pharmacist (Rheumatology and Pulmonology)

## 2023-05-08 ENCOUNTER — Encounter (HOSPITAL_COMMUNITY)
Admission: RE | Admit: 2023-05-08 | Discharge: 2023-05-08 | Disposition: A | Discharge status: Home / Self Care | Payer: Medicare PPO | Source: Ambulatory Visit | Attending: Family Medicine | Admitting: Family Medicine

## 2023-05-13 ENCOUNTER — Encounter (HOSPITAL_COMMUNITY): Payer: Self-pay | Admitting: Surgery

## 2023-05-13 ENCOUNTER — Encounter (HOSPITAL_COMMUNITY)
Admission: RE | Disposition: A | Discharge status: Home / Self Care | Payer: Self-pay | Source: Home / Self Care | Attending: Surgery

## 2023-05-13 ENCOUNTER — Inpatient Hospital Stay (HOSPITAL_COMMUNITY): Payer: Self-pay | Admitting: Anesthesiology

## 2023-05-13 ENCOUNTER — Other Ambulatory Visit: Payer: Self-pay

## 2023-05-13 ENCOUNTER — Inpatient Hospital Stay (HOSPITAL_COMMUNITY)
Admission: RE | Admit: 2023-05-13 | Discharge: 2023-05-14 | DRG: 621 | Disposition: A | Discharge status: Home / Self Care | Payer: Medicare PPO | Attending: Surgery | Admitting: Surgery

## 2023-05-13 DIAGNOSIS — Z9049 Acquired absence of other specified parts of digestive tract: Secondary | ICD-10-CM

## 2023-05-13 DIAGNOSIS — Z833 Family history of diabetes mellitus: Secondary | ICD-10-CM | POA: Diagnosis not present

## 2023-05-13 DIAGNOSIS — Z886 Allergy status to analgesic agent status: Secondary | ICD-10-CM

## 2023-05-13 DIAGNOSIS — F418 Other specified anxiety disorders: Secondary | ICD-10-CM | POA: Diagnosis not present

## 2023-05-13 DIAGNOSIS — Z803 Family history of malignant neoplasm of breast: Secondary | ICD-10-CM

## 2023-05-13 DIAGNOSIS — Z880 Allergy status to penicillin: Secondary | ICD-10-CM | POA: Diagnosis not present

## 2023-05-13 DIAGNOSIS — M069 Rheumatoid arthritis, unspecified: Secondary | ICD-10-CM | POA: Diagnosis not present

## 2023-05-13 DIAGNOSIS — Z6841 Body Mass Index (BMI) 40.0 and over, adult: Secondary | ICD-10-CM | POA: Diagnosis not present

## 2023-05-13 DIAGNOSIS — K219 Gastro-esophageal reflux disease without esophagitis: Secondary | ICD-10-CM | POA: Diagnosis present

## 2023-05-13 DIAGNOSIS — Z818 Family history of other mental and behavioral disorders: Secondary | ICD-10-CM

## 2023-05-13 DIAGNOSIS — Z888 Allergy status to other drugs, medicaments and biological substances status: Secondary | ICD-10-CM

## 2023-05-13 DIAGNOSIS — N9489 Other specified conditions associated with female genital organs and menstrual cycle: Secondary | ICD-10-CM | POA: Diagnosis not present

## 2023-05-13 DIAGNOSIS — Z96653 Presence of artificial knee joint, bilateral: Secondary | ICD-10-CM | POA: Diagnosis present

## 2023-05-13 DIAGNOSIS — Z8349 Family history of other endocrine, nutritional and metabolic diseases: Secondary | ICD-10-CM

## 2023-05-13 DIAGNOSIS — E119 Type 2 diabetes mellitus without complications: Secondary | ICD-10-CM | POA: Diagnosis not present

## 2023-05-13 DIAGNOSIS — Z8261 Family history of arthritis: Secondary | ICD-10-CM | POA: Diagnosis not present

## 2023-05-13 DIAGNOSIS — Z91040 Latex allergy status: Secondary | ICD-10-CM

## 2023-05-13 DIAGNOSIS — M1990 Unspecified osteoarthritis, unspecified site: Secondary | ICD-10-CM | POA: Diagnosis not present

## 2023-05-13 DIAGNOSIS — K660 Peritoneal adhesions (postprocedural) (postinfection): Secondary | ICD-10-CM | POA: Diagnosis present

## 2023-05-13 DIAGNOSIS — R188 Other ascites: Secondary | ICD-10-CM | POA: Diagnosis not present

## 2023-05-13 DIAGNOSIS — I10 Essential (primary) hypertension: Secondary | ICD-10-CM | POA: Diagnosis not present

## 2023-05-13 DIAGNOSIS — Z825 Family history of asthma and other chronic lower respiratory diseases: Secondary | ICD-10-CM

## 2023-05-13 DIAGNOSIS — Z809 Family history of malignant neoplasm, unspecified: Secondary | ICD-10-CM | POA: Diagnosis not present

## 2023-05-13 HISTORY — PX: UPPER GI ENDOSCOPY: SHX6162

## 2023-05-13 LAB — CBC
HCT: 40.4 % (ref 36.0–46.0)
Hemoglobin: 11.9 g/dL — ABNORMAL LOW (ref 12.0–15.0)
MCH: 23 pg — ABNORMAL LOW (ref 26.0–34.0)
MCHC: 29.5 g/dL — ABNORMAL LOW (ref 30.0–36.0)
MCV: 78.1 fL — ABNORMAL LOW (ref 80.0–100.0)
Platelets: 405 10*3/uL — ABNORMAL HIGH (ref 150–400)
RBC: 5.17 MIL/uL — ABNORMAL HIGH (ref 3.87–5.11)
RDW: 15 % (ref 11.5–15.5)
WBC: 12.8 10*3/uL — ABNORMAL HIGH (ref 4.0–10.5)
nRBC: 0 % (ref 0.0–0.2)

## 2023-05-13 LAB — CREATININE, SERUM
Creatinine, Ser: 0.85 mg/dL (ref 0.44–1.00)
GFR, Estimated: >60 mL/min (ref >60)

## 2023-05-13 LAB — TYPE AND SCREEN
ABO/RH(D): B POS
Antibody Screen: NEGATIVE

## 2023-05-13 SURGERY — GASTRECTOMY, SLEEVE, ROBOT-ASSISTED
Anesthesia: General | Site: Abdomen

## 2023-05-13 MED ORDER — ACETAMINOPHEN 500 MG PO TABS
1000.0000 mg | ORAL_TABLET | ORAL | Status: AC
Start: 2023-05-13 — End: 2023-05-13
  Administered 2023-05-13: 1000 mg via ORAL
  Filled 2023-05-13: qty 2

## 2023-05-13 MED ORDER — BUPIVACAINE-EPINEPHRINE (PF) 0.25% -1:200000 IJ SOLN
INTRAMUSCULAR | Status: AC
  Filled 2023-05-13: qty 30

## 2023-05-13 MED ORDER — HEPARIN SODIUM (PORCINE) 5000 UNIT/ML IJ SOLN
5000.0000 [IU] | Freq: Once | INTRAMUSCULAR | Status: AC
  Administered 2023-05-13: 5000 [IU] via SUBCUTANEOUS
  Filled 2023-05-13: qty 1

## 2023-05-13 MED ORDER — OXYCODONE HCL 5 MG/5ML PO SOLN
5.0000 mg | Freq: Four times a day (QID) | ORAL | Status: DC | PRN
  Administered 2023-05-13: 5 mg via ORAL
  Filled 2023-05-13 (×3): qty 5

## 2023-05-13 MED ORDER — BUPIVACAINE-EPINEPHRINE 0.25% -1:200000 IJ SOLN
INTRAMUSCULAR | Status: DC | PRN
  Administered 2023-05-13: 60 mL

## 2023-05-13 MED ORDER — CEFAZOLIN SODIUM-DEXTROSE 3-4 GM/150ML-% IV SOLN
3.0000 g | INTRAVENOUS | Status: AC
  Administered 2023-05-13: 3 g via INTRAVENOUS
  Filled 2023-05-13: qty 150

## 2023-05-13 MED ORDER — SUGAMMADEX SODIUM 200 MG/2ML IV SOLN
INTRAVENOUS | Status: DC | PRN
  Administered 2023-05-13: 400 mg via INTRAVENOUS
  Administered 2023-05-13: 200 mg via INTRAVENOUS

## 2023-05-13 MED ORDER — MORPHINE SULFATE (PF) 2 MG/ML IV SOLN: 1.0000 mg | INTRAVENOUS | Status: DC | PRN

## 2023-05-13 MED ORDER — LIDOCAINE HCL (PF) 2 % IJ SOLN
INTRAMUSCULAR | Status: DC | PRN
  Administered 2023-05-13: 100 mg via INTRADERMAL

## 2023-05-13 MED ORDER — AMISULPRIDE (ANTIEMETIC) 5 MG/2ML IV SOLN
10.0000 mg | Freq: Once | INTRAVENOUS | Status: AC | PRN
  Administered 2023-05-13: 10 mg via INTRAVENOUS

## 2023-05-13 MED ORDER — KETAMINE HCL 50 MG/5ML IJ SOSY
PREFILLED_SYRINGE | INTRAMUSCULAR | Status: AC
  Filled 2023-05-13: qty 5

## 2023-05-13 MED ORDER — AMISULPRIDE (ANTIEMETIC) 5 MG/2ML IV SOLN
INTRAVENOUS | Status: AC
  Filled 2023-05-13: qty 4

## 2023-05-13 MED ORDER — ACETAMINOPHEN 500 MG PO TABS
1000.0000 mg | ORAL_TABLET | Freq: Three times a day (TID) | ORAL | Status: DC
  Administered 2023-05-13 – 2023-05-14 (×4): 1000 mg via ORAL
  Filled 2023-05-13 (×4): qty 2

## 2023-05-13 MED ORDER — SUCCINYLCHOLINE CHLORIDE 200 MG/10ML IV SOSY
PREFILLED_SYRINGE | INTRAVENOUS | Status: DC | PRN
  Administered 2023-05-13: 140 mg via INTRAVENOUS

## 2023-05-13 MED ORDER — FENTANYL CITRATE PF 50 MCG/ML IJ SOSY
PREFILLED_SYRINGE | INTRAMUSCULAR | Status: AC
  Filled 2023-05-13: qty 2

## 2023-05-13 MED ORDER — PROPOFOL 10 MG/ML IV BOLUS
INTRAVENOUS | Status: AC
  Filled 2023-05-13: qty 20

## 2023-05-13 MED ORDER — PANTOPRAZOLE SODIUM 40 MG IV SOLR
40.0000 mg | Freq: Every day | INTRAVENOUS | Status: DC
  Administered 2023-05-13: 40 mg via INTRAVENOUS
  Filled 2023-05-13: qty 10

## 2023-05-13 MED ORDER — ORAL CARE MOUTH RINSE: 15.0000 mL | Freq: Once | OROMUCOSAL | Status: AC

## 2023-05-13 MED ORDER — SCOPOLAMINE 1 MG/3DAYS TD PT72
1.0000 | MEDICATED_PATCH | TRANSDERMAL | Status: DC
  Administered 2023-05-13: 1.5 mg via TRANSDERMAL
  Filled 2023-05-13: qty 1

## 2023-05-13 MED ORDER — ROCURONIUM BROMIDE 10 MG/ML (PF) SYRINGE
PREFILLED_SYRINGE | INTRAVENOUS | Status: AC
  Filled 2023-05-13: qty 10

## 2023-05-13 MED ORDER — GABAPENTIN 300 MG PO CAPS
300.0000 mg | ORAL_CAPSULE | ORAL | Status: AC
Start: 2023-05-13 — End: 2023-05-13
  Administered 2023-05-13: 300 mg via ORAL
  Filled 2023-05-13: qty 1

## 2023-05-13 MED ORDER — CHLORHEXIDINE GLUCONATE 0.12 % MT SOLN
15.0000 mL | Freq: Once | OROMUCOSAL | Status: AC
  Administered 2023-05-13: 15 mL via OROMUCOSAL

## 2023-05-13 MED ORDER — MIDAZOLAM HCL 2 MG/2ML IJ SOLN
INTRAMUSCULAR | Status: DC | PRN
  Administered 2023-05-13: 2 mg via INTRAVENOUS

## 2023-05-13 MED ORDER — ROCURONIUM BROMIDE 100 MG/10ML IV SOLN
INTRAVENOUS | Status: DC | PRN
  Administered 2023-05-13: 60 mg via INTRAVENOUS
  Administered 2023-05-13: 20 mg via INTRAVENOUS

## 2023-05-13 MED ORDER — PHENYLEPHRINE HCL-NACL 20-0.9 MG/250ML-% IV SOLN
INTRAVENOUS | Status: DC | PRN
  Administered 2023-05-13: 40 ug/min via INTRAVENOUS

## 2023-05-13 MED ORDER — PHENYLEPHRINE 80 MCG/ML (10ML) SYRINGE FOR IV PUSH (FOR BLOOD PRESSURE SUPPORT)
PREFILLED_SYRINGE | INTRAVENOUS | Status: AC
  Filled 2023-05-13: qty 10

## 2023-05-13 MED ORDER — KETAMINE HCL 10 MG/ML IJ SOLN
INTRAMUSCULAR | Status: DC | PRN
Start: 2023-05-13 — End: 2023-05-13
  Administered 2023-05-13: 30 mg via INTRAVENOUS
  Administered 2023-05-13: 20 mg via INTRAVENOUS

## 2023-05-13 MED ORDER — LACTATED RINGERS IV SOLN: INTRAVENOUS | Status: DC | PRN

## 2023-05-13 MED ORDER — ACETAMINOPHEN 160 MG/5ML PO SOLN
1000.0000 mg | Freq: Three times a day (TID) | ORAL | Status: DC
Start: 2023-05-13 — End: 2023-05-14

## 2023-05-13 MED ORDER — BUPIVACAINE LIPOSOME 1.3 % IJ SUSP
INTRAMUSCULAR | Status: AC
  Filled 2023-05-13: qty 20

## 2023-05-13 MED ORDER — ONDANSETRON HCL 4 MG/2ML IJ SOLN
4.0000 mg | INTRAMUSCULAR | Status: DC | PRN
  Administered 2023-05-13: 4 mg via INTRAVENOUS
  Filled 2023-05-13 (×2): qty 2

## 2023-05-13 MED ORDER — FENTANYL CITRATE (PF) 100 MCG/2ML IJ SOLN
INTRAMUSCULAR | Status: DC | PRN
  Administered 2023-05-13: 100 ug via INTRAVENOUS

## 2023-05-13 MED ORDER — ONDANSETRON HCL 4 MG/2ML IJ SOLN
INTRAMUSCULAR | Status: AC
  Filled 2023-05-13: qty 2

## 2023-05-13 MED ORDER — BUPIVACAINE LIPOSOME 1.3 % IJ SUSP: 20.0000 mL | Freq: Once | INTRAMUSCULAR | Status: DC

## 2023-05-13 MED ORDER — LIDOCAINE HCL (PF) 2 % IJ SOLN
INTRAMUSCULAR | Status: AC
  Filled 2023-05-13: qty 5

## 2023-05-13 MED ORDER — PHENYLEPHRINE HCL-NACL 20-0.9 MG/250ML-% IV SOLN
INTRAVENOUS | Status: AC
Start: 2023-05-13 — End: 2023-05-13
  Filled 2023-05-13: qty 750

## 2023-05-13 MED ORDER — CHLORHEXIDINE GLUCONATE CLOTH 2 % EX PADS: 6.0000 | MEDICATED_PAD | Freq: Once | CUTANEOUS | Status: DC

## 2023-05-13 MED ORDER — SUCCINYLCHOLINE CHLORIDE 200 MG/10ML IV SOSY
PREFILLED_SYRINGE | INTRAVENOUS | Status: AC
  Filled 2023-05-13: qty 10

## 2023-05-13 MED ORDER — CEFAZOLIN SODIUM-DEXTROSE 1-4 GM/50ML-% IV SOLN: INTRAVENOUS | Status: DC | PRN

## 2023-05-13 MED ORDER — DEXAMETHASONE SODIUM PHOSPHATE 10 MG/ML IJ SOLN
INTRAMUSCULAR | Status: DC | PRN
  Administered 2023-05-13: 8 mg via INTRAVENOUS

## 2023-05-13 MED ORDER — SIMETHICONE 80 MG PO CHEW
80.0000 mg | CHEWABLE_TABLET | Freq: Four times a day (QID) | ORAL | Status: DC | PRN
  Administered 2023-05-13: 80 mg via ORAL
  Filled 2023-05-13 (×2): qty 1

## 2023-05-13 MED ORDER — DEXAMETHASONE SODIUM PHOSPHATE 10 MG/ML IJ SOLN
INTRAMUSCULAR | Status: AC
  Filled 2023-05-13: qty 1

## 2023-05-13 MED ORDER — MIDAZOLAM HCL 2 MG/2ML IJ SOLN
INTRAMUSCULAR | Status: AC
  Filled 2023-05-13: qty 2

## 2023-05-13 MED ORDER — LACTATED RINGERS IV SOLN: INTRAVENOUS | Status: AC

## 2023-05-13 MED ORDER — ENSURE MAX PROTEIN PO LIQD
2.0000 [oz_av] | ORAL | Status: DC
  Administered 2023-05-14 (×5): 2 [oz_av] via ORAL

## 2023-05-13 MED ORDER — FENTANYL CITRATE PF 50 MCG/ML IJ SOSY
25.0000 ug | PREFILLED_SYRINGE | INTRAMUSCULAR | Status: DC | PRN
  Administered 2023-05-13 (×2): 50 ug via INTRAVENOUS

## 2023-05-13 MED ORDER — ONDANSETRON HCL 4 MG/2ML IJ SOLN
INTRAMUSCULAR | Status: DC | PRN
Start: 2023-05-13 — End: 2023-05-13
  Administered 2023-05-13: 4 mg via INTRAVENOUS

## 2023-05-13 MED ORDER — FENTANYL CITRATE (PF) 100 MCG/2ML IJ SOLN
INTRAMUSCULAR | Status: AC
  Filled 2023-05-13: qty 2

## 2023-05-13 MED ORDER — HEPARIN SODIUM (PORCINE) 5000 UNIT/ML IJ SOLN
5000.0000 [IU] | Freq: Three times a day (TID) | INTRAMUSCULAR | Status: DC
  Administered 2023-05-13 – 2023-05-14 (×3): 5000 [IU] via SUBCUTANEOUS
  Filled 2023-05-13 (×3): qty 1

## 2023-05-13 MED ORDER — PHENYLEPHRINE 80 MCG/ML (10ML) SYRINGE FOR IV PUSH (FOR BLOOD PRESSURE SUPPORT)
PREFILLED_SYRINGE | INTRAVENOUS | Status: DC | PRN
  Administered 2023-05-13: 80 ug via INTRAVENOUS

## 2023-05-13 MED ORDER — PROPOFOL 10 MG/ML IV BOLUS
INTRAVENOUS | Status: DC | PRN
  Administered 2023-05-13: 200 mg via INTRAVENOUS

## 2023-05-13 SURGICAL SUPPLY — 57 items
ANTIFOG SOL W/FOAM PAD STRL (MISCELLANEOUS) ×2 IMPLANT
APPLIER CLIP ROT 10 11.4 M/L (STAPLE) IMPLANT
BAG COUNTER SPONGE SURGICOUNT (BAG) ×3 IMPLANT
BLADE SURG SZ11 CARB STEEL (BLADE) ×3 IMPLANT
CANNULA REDUCER 12-8 DVNC XI (CANNULA) ×3 IMPLANT
CHLORAPREP W/TINT 26 (MISCELLANEOUS) ×6 IMPLANT
CLIP APPLIE ROT 10 11.4 M/L (STAPLE) IMPLANT
COVER SURGICAL LIGHT HANDLE (MISCELLANEOUS) ×3 IMPLANT
COVER TIP SHEARS 8 DVNC (MISCELLANEOUS) IMPLANT
DERMABOND ADVANCED .7 DNX12 (GAUZE/BANDAGES/DRESSINGS) ×3 IMPLANT
DRAPE ARM DVNC X/XI (DISPOSABLE) ×12 IMPLANT
DRAPE COLUMN DVNC XI (DISPOSABLE) ×3 IMPLANT
DRIVER NDL MEGA SUTCUT DVNCXI (INSTRUMENTS) ×3 IMPLANT
DRIVER NDLE MEGA SUTCUT DVNCXI (INSTRUMENTS) ×2 IMPLANT
ELECT REM PT RETURN 15FT ADLT (MISCELLANEOUS) ×3 IMPLANT
GAUZE 4X4 16PLY ~~LOC~~+RFID DBL (SPONGE) ×3 IMPLANT
GLOVE BIO SURGEON STRL SZ7.5 (GLOVE) ×6 IMPLANT
GLOVE INDICATOR 8.0 STRL GRN (GLOVE) ×6 IMPLANT
GOWN STRL REUS W/ TWL XL LVL3 (GOWN DISPOSABLE) ×6 IMPLANT
GRASPER SUT TROCAR 14GX15 (MISCELLANEOUS) ×3 IMPLANT
GRASPER TIP-UP FEN DVNC XI (INSTRUMENTS) ×3 IMPLANT
HEMOSTAT SNOW SURGICEL 2X4 (HEMOSTASIS) IMPLANT
IRRIG SUCT STRYKERFLOW 2 WTIP (MISCELLANEOUS) ×2 IMPLANT
IRRIGATION SUCT STRKRFLW 2 WTP (MISCELLANEOUS) ×3 IMPLANT
KIT BASIN OR (CUSTOM PROCEDURE TRAY) ×3 IMPLANT
KIT TURNOVER KIT A (KITS) IMPLANT
LUBRICANT JELLY K Y 4OZ (MISCELLANEOUS) IMPLANT
MARKER SKIN DUAL TIP RULER LAB (MISCELLANEOUS) IMPLANT
MAT PREVALON FULL STRYKER (MISCELLANEOUS) ×3 IMPLANT
NDL SPNL 18GX3.5 QUINCKE PK (NEEDLE) ×3 IMPLANT
NEEDLE SPNL 18GX3.5 QUINCKE PK (NEEDLE) ×2 IMPLANT
OBTURATOR OPTICAL STND 8 DVNC (TROCAR) ×2 IMPLANT
OBTURATOR OPTICALSTD 8 DVNC (TROCAR) ×3 IMPLANT
PACK CARDIOVASCULAR III (CUSTOM PROCEDURE TRAY) ×3 IMPLANT
RELOAD STAPLE 60 2.5 WHT DVNC (STAPLE) IMPLANT
RELOAD STAPLE 60 3.5 BLU DVNC (STAPLE) IMPLANT
SCISSORS LAP 5X35 DISP (ENDOMECHANICALS) IMPLANT
SCISSORS MNPLR CVD DVNC XI (INSTRUMENTS) ×3 IMPLANT
SEAL UNIV 5-12 XI (MISCELLANEOUS) ×12 IMPLANT
SEALER VESSEL EXT DVNC XI (MISCELLANEOUS) ×3 IMPLANT
SET TUBE SMOKE EVAC HIGH FLOW (TUBING) ×3 IMPLANT
SLEEVE GASTRECTOMY 40FR VISIGI (MISCELLANEOUS) IMPLANT
SOL ELECTROSURG ANTI STICK (MISCELLANEOUS) ×2 IMPLANT
SOLUTION ANTFG W/FOAM PAD STRL (MISCELLANEOUS) ×3 IMPLANT
SOLUTION ELECTROSURG ANTI STCK (MISCELLANEOUS) ×3 IMPLANT
SPIKE FLUID TRANSFER (MISCELLANEOUS) ×3 IMPLANT
STAPLER 60 SUREFORM DVNC (STAPLE) ×3 IMPLANT
STAPLER RELOAD 2.5X60 WHT DVNC (STAPLE) ×8 IMPLANT
STAPLER RELOAD 3.5X60 BLU DVNC (STAPLE) ×2 IMPLANT
SUT MNCRL AB 4-0 PS2 18 (SUTURE) ×6 IMPLANT
SUT VIC AB 0 CT1 27XBRD ANTBC (SUTURE) ×3 IMPLANT
SUT VIC AB 2-0 SH 27XBRD (SUTURE) ×3 IMPLANT
SYR 20ML LL LF (SYRINGE) ×3 IMPLANT
TOWEL OR 17X26 10 PK STRL BLUE (TOWEL DISPOSABLE) ×3 IMPLANT
TRAY FOLEY MTR SLVR 16FR STAT (SET/KITS/TRAYS/PACK) IMPLANT
TROCAR ADV FIXATION 12X100MM (TROCAR) IMPLANT
TROCAR Z-THREAD FIOS 5X100MM (TROCAR) ×3 IMPLANT

## 2023-05-13 NOTE — Progress Notes (Signed)

## 2023-05-13 NOTE — Anesthesia Preprocedure Evaluation (Signed)
 Anesthesia Evaluation  Patient identified by MRN, date of birth, ID band Patient awake    Reviewed: Allergy & Precautions, NPO status , Patient's Chart, lab work & pertinent test results  Airway Mallampati: III  TM Distance: >3 FB Neck ROM: Full    Dental  (+) Dental Advisory Given   Pulmonary neg pulmonary ROS   breath sounds clear to auscultation       Cardiovascular hypertension, Pt. on medications  Rhythm:Regular Rate:Normal     Neuro/Psych  Headaches  Neuromuscular disease    GI/Hepatic Neg liver ROS,GERD  ,,  Endo/Other    Class 4 obesity  Renal/GU negative Renal ROS     Musculoskeletal  (+) Arthritis ,    Abdominal   Peds  Hematology  (+) Blood dyscrasia, anemia   Anesthesia Other Findings   Reproductive/Obstetrics                             Anesthesia Physical Anesthesia Plan  ASA: 4  Anesthesia Plan: General   Post-op Pain Management: Tylenol PO (pre-op)* and Ketamine IV*   Induction: Intravenous  PONV Risk Score and Plan: 3 and Dexamethasone, Ondansetron, Treatment may vary due to age or medical condition and Scopolamine patch - Pre-op  Airway Management Planned: Oral ETT  Additional Equipment:   Intra-op Plan:   Post-operative Plan: Extubation in OR  Informed Consent: I have reviewed the patients History and Physical, chart, labs and discussed the procedure including the risks, benefits and alternatives for the proposed anesthesia with the patient or authorized representative who has indicated his/her understanding and acceptance.     Dental advisory given  Plan Discussed with: CRNA  Anesthesia Plan Comments:        Anesthesia Quick Evaluation

## 2023-05-13 NOTE — Op Note (Signed)
 Patient: [name] ([birth year], [id number])  Date of Surgery: 05/13/2023  Preoperative Diagnosis: MORBID OBESITY   Postoperative Diagnosis: MORBID OBESITY   Surgical Procedure: ROBOTIC SLEEVE GASTRECTOMY:  UPPER GI ENDOSCOPY: 43235 (CPT)   Operative Team Members:  Surgeons and Role:    * Shandell Jallow, Hyman Hopes, MD - Primary    * Berna Bue, MD - Assisting   Anesthesiologist: Marcene Duos, MD CRNA: Micki Riley, CRNA   Anesthesia: General   Fluids:  Total I/O In: 1000 [I.V.:1000] Out: -   Complications: None  Drains:  none   Specimen:  ID Type Source Tests Collected by Time Destination  1 : Greater curviture of stomach Tissue PATH GI benign resection SURGICAL PATHOLOGY Allin Frix, Hyman Hopes, MD 05/13/2023 1029      Disposition:  PACU - hemodynamically stable.  Plan of Care: Admit to inpatient     Indications for Procedure:  [name] is a 63 y.o. female who is seen for bariatric surgery consultation. The patient has morbid obesity with a BMI of Body mass index is 65.98 kg/m. and the following conditions related to obesity: Hypertension, Type 2 diabetes.  She also has rheumatoid arthritis.  Today we discussed the surgical options to treat obesity and its associated comorbidity. After discussing the available procedures in the region, we discussed in great detail the surgeries I offer: robotic sleeve gastrectomy and robotic roux-en-y gastric bypass. We discussed the procedures themselves as well as their risks, benefits and alternatives. I entered the patient's basic information into the [facility] Metabolic Surgery Risk/Benefit Calculator to facilitate this discussion.  I also entered the patient's information into the [facility] Individualized Metabolic Surgery Score calculator to help explain surgery's effect on Type 2 Diabetes.   After a full discussion and all questions answered, the patient is interested in  pursuing a robotic sleeve gastrectomy with upper endoscopy.  She completed the bariatric surgery preoperative pathway to include the following: - Bloodwork - Dietician consult - Chest x-ray - EKG - Psychology evaluation - Upper endoscopy with biopsy   Today she presents for surgery.  We again discussed robotic sleeve gastrectomy with upper endoscopy.  We discussed the procedure, its risks, benefits and alternatives and the patient granted consent to proceed.   Findings: Adhesions between omentum and abdominal wall  Infection status: Patient: Private Patient Elective Case Case: Elective Infection Present At Time Of Surgery (PATOS): None   Description of Procedure:   On the date stated above, the patient was taken to the operating room suite and placed in supine positioning.  General endotracheal anesthesia was induced.  A timeout was completed verifying the correct patient, procedure, positioning and equipment needed for the case.  The patient's abdomen was prepped and draped in the usual sterile fashion.  I entered the patient's right upper quadrant using a 5 mm trocar in the optical technique.  There was no trauma to underlying viscera with initial trocar placement.  The abdomen was insufflated 15 mmHg.  A total of 4 robotic trochars were placed across the mid abdomen, including the 5 mm initial trocar being upsized to a 8 mm trocar.  The robotic stapler trocar was placed in the number two position.  The [facility] liver retractor was placed through the subxiphoid region and under the left lobe of the liver and was connected to the rail of the bed.  A TAP block was placed using marcaine and Exparel under direct vision of the laparoscope.  The [employer] XI robotic platform  was docked and we transitioned to robotic surgery.  There were some adhesions between the omentum and abdominal wall that were lysed sharply and bluntly.   Using the tip up fenestrated grasper, fenestrated bipolar, 30  degree camera and Vessel Sealer from the patient's right to left, we began by dissecting the angle of His off the left crus of the diaphragm.  The adhesions between the stomach, spleen and diaphragm were divided using the Vessel Sealer to define the angle of His.    I then started 6 cm away from the pylorus along the greater curve the stomach and divided the gastroepiploic vessels and the gastrocolic ligament.  The lesser sac was entered.  There were really no  adhesions to the posterior wall of the stomach.  The greater curve was mobilized working superiorly toward the spleen.  All of the gastroepiploic and short gastric vessels were divided as we divided the gastrocolic and gastrosplenic ligaments.  As we reached the splenic hilum, I lifted the stomach anteriorly.  I created a tunnel between the stomach and its attachments to the retroperitoneum posteriorly just to the left of the GE junction until I encountered the left crus and my previous angle of His dissection.  We then were able to approach the shortest of the short gastrics both from the greater curve the stomach laterally and from the left crus medially.  These were divided using the Vessel Sealer and the fundus of the stomach was fully mobilized.  With the stomach fully mobilized we direct our attention to stapling.  A 40 French VISI G was inserted into the stomach and positioned along the lesser curve the stomach and suction was applied.  The 60 mm robotic sureform linear stapler was used to create the sleeve gastrectomy.  We started 6 cm from the pylorus and were careful to avoid narrowing at the incisura.  We stayed about 1 cm away from the GE junction to protect the sling fibers.  We used one blue and four white loads of the linear stapler.  With the sleeve gastrectomy completed the VISI G was taken off suction and removed and we performed an upper endoscopy.  The intra-abdominal pressure was decreased to to check hemostasis.  The foregut was  submerged in saline irrigation and the adult upper endoscope was inserted into the stomach as far as the pylorus to inspect the sleeve.  The sleeve appeared appropriately oriented without any twisting.  There was good hemostasis .  The sleeve was widely patent at the incisura with no narrowing.  There was no significant retained fundus.  The sleeve was inflated with the endoscope and there was no bubbling of the irrigation, suggesting a negative leak test and a airtight sleeve gastrectomy.  The foregut was decompressed with the endoscope and the endoscope was removed.   An omentopexy was performed, using running 2-0 vicryl suture to connect the inferior two staple lines to the divided omentum. There was good hemostasis at the end of the case.  The robot was undocked and moved away from the field.  The sleeve gastrectomy specimen was removed from the stapler port.  The fascia of the stapler port was closed using a 0 Vicryl on a PMI suture passer.   The liver retractor was removed under direct vision.  The pneumoperitoneum was evacuated.  The skin was closed using 4-0 Monocryl and Dermabond.  All sponge and needle counts were correct at the end of the case.    Ivar Drape, MD General,  Bariatric, & Minimally Invasive Surgery [facility]

## 2023-05-13 NOTE — Progress Notes (Signed)
 PHARMACY CONSULT FOR:  Risk Assessment for Post-Discharge VTE Following Bariatric Surgery  Procedure* Sleeve gastrectomy  Sex Female  Black race Yes  Age (years) 39  BMI (kg/m2) 66  Operation duration (minutes) 63 minutes  History of VTE requiring treatment* No  Hypercoagulable condition* No  Liver disorder* No  Pre-op venous stasis No  Pre-op functional health status Independent  Previous foregut or bariatric surg No  Post-op surgical site infection NA  Transfusion intra- or post-op* No  Unplanned readmission NA  Unplanned reoperation NA  GI perforation/leak/obstruction* No  *specific risk factors for portomesenteric venous thrombosis   Predicted probability of 30-day post-discharge VTE:    0.6024 % estimated using the [facility] Calculator  Other patient-specific factors to consider: NA   Recommendation for Discharge: Enoxaparin 60 mg [place] q12h x 2 weeks post-discharge  This order has been pended for discharge to be signed by discharging physician at their discretion   [name] is a 63 y.o. female who underwent sleeve gastrectomy on 05/13/23   Case start: 0942 Case end: 1045   Allergies  Allergen Reactions   Aleve [Naproxen Sodium] Shortness Of Breath   Penicillins Swelling    .Has patient had a PCN reaction causing immediate rash, facial/tongue/throat swelling, SOB or lightheadedness with hypotension: Yes Has patient had a PCN reaction causing severe rash involving mucus membranes or skin necrosis: No Has patient had a PCN reaction that required hospitalization: No Has patient had a PCN reaction occurring within the last 10 years: No If all of the above answers are "NO", then may proceed with Cephalosporin use.    Adhesive [Tape]     rash   Latex Rash    Patient Measurements: Height: 5\' 1"  (154.9 cm) Weight: (!) 158.6 kg (349 lb 9.6 oz) IBW/kg (Calculated) : 47.8 Body mass index is 66.06 kg/m.  No results for  input(s): "WBC", "HGB", "HCT", "PLT", "APTT", "CREATININE", "LABCREA", "CREAT24HRUR", "MG", "PHOS", "ALBUMIN", "PROT", "AST", "ALT", "ALKPHOS", "BILITOT", "BILIDIR", "IBILI" in the last 72 hours. Estimated Creatinine Clearance: 108.7 mL/min (by C-G formula based on SCr of 0.79 mg/dL).    Past Medical History:  Diagnosis Date   Allergy    Anemia    Annual visit for general adult medical examination with abnormal findings 01/02/2021   Anxiety    Sometimes   Chronic back pain    Degenerative joint disease    Left TKA in 07/2009-Dr. Landau   Depression 2001   doesn't take any meds   Essential hypertension 11/26/2011   GERD (gastroesophageal reflux disease)    Headache(784.0)    occasionally   Human bite of forearm 11/30/2019   Joint pain    Joint swelling    Morbid obesity (HCC)    Nocturia    Osteoarthritis of right knee 07/13/2013   Peripheral edema    to right leg;takes Furosemide occasionally and hasnt had one in a month   Pre-diabetes    Rheumatoid arthritis (HCC)    Syncope    with palpitations; evaluated by [facility] in 2008 with normal echo     Medications Prior to Admission  Medication Sig Dispense Refill Last Dose/Taking   cholecalciferol (VITAMIN D3) 25 MCG (1000 UNIT) tablet Take 1,000 Units by mouth daily.   05/08/2023   folic acid (FOLVITE) 1 MG tablet TAKE 2 TABLETS BY MOUTH EVERY DAY (Patient taking differently: Take 1 mg by mouth daily.) 180 tablet 3 05/12/2023   furosemide (LASIX) 20 MG tablet  Take 20 mg by mouth.   05/12/2023   methotrexate 50 MG/2ML injection Inject 0.8 mLs (20 mg total) into the skin once a week. 10 mL 0 04/22/2023   triamterene-hydrochlorothiazide (MAXZIDE-25) 37.5-25 MG tablet TAKE 1 TABLET BY MOUTH EVERY DAY 90 tablet 1 05/06/2023   Upadacitinib ER (RINVOQ) 15 MG TB24 Take 1 tablet (15 mg total) by mouth daily. 30 tablet 0 05/12/2023      Pricilla Riffle, PharmD, BCPS Clinical Pharmacist 05/13/2023 4:16 PM

## 2023-05-13 NOTE — Discharge Instructions (Signed)

## 2023-05-13 NOTE — Transfer of Care (Signed)
 Immediate Anesthesia Transfer of Care Note  Patient: [name]  Procedure(s) Performed: ROBOTIC SLEEVE GASTRECTOMY (Abdomen) UPPER GI ENDOSCOPY  Patient Location: PACU  Anesthesia Type:General  Level of Consciousness: awake, alert , and oriented  Airway & Oxygen Therapy: Patient Spontanous Breathing and Patient connected to face mask oxygen  Post-op Assessment: Report given to RN  Post vital signs: stable  Last Vitals:  Vitals Value Taken Time  BP 150/66 05/13/23 1104  Temp    Pulse 77 05/13/23 1107  Resp 30 05/13/23 1107  SpO2 100 % 05/13/23 1107  Vitals shown include unfiled device data.  Last Pain:  Vitals:   05/13/23 0759  TempSrc: Oral         Complications: No notable events documented.

## 2023-05-13 NOTE — Anesthesia Procedure Notes (Signed)
 Procedure Name: Intubation Date/Time: 05/13/2023 9:30 AM  Performed by: Micki Riley, CRNAPre-anesthesia Checklist: Patient identified, Emergency Drugs available, Suction available, Patient being monitored and Timeout performed Patient Re-evaluated:Patient Re-evaluated prior to induction Oxygen Delivery Method: Circle system utilized Preoxygenation: Pre-oxygenation with 100% oxygen Induction Type: IV induction and Rapid sequence Laryngoscope Size: Miller and 2 Grade View: Grade I Tube size: 7.0 mm Number of attempts: 1 Airway Equipment and Method: Patient positioned with wedge pillow and Stylet Placement Confirmation: ETT inserted through vocal cords under direct vision Secured at: 22 cm Tube secured with: Tape Dental Injury: Teeth and Oropharynx as per pre-operative assessment

## 2023-05-13 NOTE — Anesthesia Postprocedure Evaluation (Signed)
 Anesthesia Post Note  Patient: [name]  Procedure(s) Performed: ROBOTIC SLEEVE GASTRECTOMY (Abdomen) UPPER GI ENDOSCOPY     Patient location during evaluation: PACU Anesthesia Type: General Level of consciousness: awake and alert Pain management: pain level controlled Vital Signs Assessment: post-procedure vital signs reviewed and stable Respiratory status: spontaneous breathing, nonlabored ventilation, respiratory function stable and patient connected to nasal cannula oxygen Cardiovascular status: blood pressure returned to baseline and stable Postop Assessment: no apparent nausea or vomiting Anesthetic complications: no  No notable events documented.  Last Vitals:  Vitals:   05/13/23 1215 05/13/23 1221  BP: (!) 143/68   Pulse: 61 (!) 53  Resp: 19 20  Temp:    SpO2: 100% 96%    Last Pain:  Vitals:   05/13/23 1221  TempSrc:   PainSc: Asleep                 Kennieth Rad

## 2023-05-13 NOTE — H&P (Signed)
 Admitting Physician: Hyman Hopes Mckynzie Liwanag  Service: Bariatric surgery  CC: Obesity  Subjective   HPI: [name] is an 63 y.o. female who is here for sleeve gastrectomy  Past Medical History:  Diagnosis Date   Allergy    Anemia    Annual visit for general adult medical examination with abnormal findings 01/02/2021   Anxiety    Sometimes   Chronic back pain    Degenerative joint disease    Left TKA in 07/2009-Dr. Landau   Depression 2001   doesn't take any meds   Essential hypertension 11/26/2011   GERD (gastroesophageal reflux disease)    Headache(784.0)    occasionally   Human bite of forearm 11/30/2019   Joint pain    Joint swelling    Morbid obesity (HCC)    Nocturia    Osteoarthritis of right knee 07/13/2013   Peripheral edema    to right leg;takes Furosemide occasionally and hasnt had one in a month   Pre-diabetes    Rheumatoid arthritis (HCC)    Syncope    with palpitations; evaluated by [facility] in 2008 with normal echo    Past Surgical History:  Procedure Laterality Date   ABDOMINAL HYSTERECTOMY  1995   no oophorectomy   BIOPSY  11/26/2022   Procedure: BIOPSY;  Surgeon: Dolores Frame, MD;  [facility];  Service: Gastroenterology;;   BIOPSY  02/17/2023   Procedure: BIOPSY;  Surgeon: Lemar Lofty., MD;  Location: [facility];  Service: Gastroenterology;;   CARPAL TUNNEL RELEASE  1991   Bilateral   CESAREAN SECTION     X2   CHOLECYSTECTOMY     CHOLECYSTECTOMY, LAPAROSCOPIC  2007   Dr. Lovell Sheehan   COLONOSCOPY N/A 03/03/2013   Procedure: COLONOSCOPY;  Surgeon: Malissa Hippo, MD;  [facility];  Service: Endoscopy;  Laterality: N/A;  930   COLONOSCOPY WITH PROPOFOL N/A 11/26/2022   Procedure: COLONOSCOPY WITH PROPOFOL;  Surgeon: Dolores Frame, MD;  [facility];  Service: Gastroenterology;  Laterality: N/A;  7:30 am, asa 3   CRANIECTOMY SUBOCCIPITAL W/  CERVICAL LAMINECTOMY / CHIARI  2007   Repair of Arnold-Chiari malformation   ESOPHAGOGASTRODUODENOSCOPY (EGD) WITH PROPOFOL N/A 11/26/2022   Procedure: ESOPHAGOGASTRODUODENOSCOPY (EGD) WITH PROPOFOL;  Surgeon: Dolores Frame, MD;  [facility];  Service: Gastroenterology;  Laterality: N/A;   ESOPHAGOGASTRODUODENOSCOPY (EGD) WITH PROPOFOL N/A 02/17/2023   Procedure: ESOPHAGOGASTRODUODENOSCOPY (EGD) WITH PROPOFOL;  Surgeon: Meridee Score Netty Starring., MD;  Location: [facility];  Service: Gastroenterology;  Laterality: N/A;   EUS N/A 02/17/2023   Procedure: UPPER ENDOSCOPIC ULTRASOUND (EUS) RADIAL;  Surgeon: Lemar Lofty., MD;  Location: [facility];  Service: Gastroenterology;  Laterality: N/A;   EYE SURGERY     bilateral cataract surgery   GIVENS CAPSULE STUDY N/A 12/13/2022   Procedure: GIVENS CAPSULE STUDY;  Surgeon: Dolores Frame, MD;  [facility];  Service: Gastroenterology;  Laterality: N/A;  7:30am   JOINT REPLACEMENT Left 2011   KNEE ARTHROSCOPY  1995   Dr. Cleophas Dunker    THYROIDECTOMY, PARTIAL  2004   Left lobectomy - large benign nodule   TONSILLECTOMY     TOTAL KNEE ARTHROPLASTY  07/2009   Left knee , Dr. Dion Saucier    TOTAL KNEE ARTHROPLASTY Right 07/13/2013   Procedure: TOTAL KNEE ARTHROPLASTY;  Surgeon: Eulas Post, MD;  Location: [facility];  Service: Orthopedics;  Laterality: Right;   TUBAL LIGATION  Family History  Problem Relation Age of Onset   Diabetes Mother    Depression Mother    Diabetes Father        emphsema    Hyperlipidemia Father    Emphysema Father    Rheum arthritis Sister    Breast cancer Sister    Cancer Sister    Healthy Daughter    Healthy Daughter    Healthy Daughter    Colon cancer Neg Hx     Social:  reports that she has never smoked. She has never been exposed to tobacco smoke. She has never used smokeless tobacco. She reports current alcohol use. She reports that she does not use  drugs.  Allergies:  Allergies  Allergen Reactions   Aleve [Naproxen Sodium] Shortness Of Breath   Penicillins Swelling    .Has patient had a PCN reaction causing immediate rash, facial/tongue/throat swelling, SOB or lightheadedness with hypotension: Yes Has patient had a PCN reaction causing severe rash involving mucus membranes or skin necrosis: No Has patient had a PCN reaction that required hospitalization: No Has patient had a PCN reaction occurring within the last 10 years: No If all of the above answers are "NO", then may proceed with Cephalosporin use.    Adhesive [Tape]     rash   Latex Rash    Medications: Current Outpatient Medications  Medication Instructions   cholecalciferol (VITAMIN D3) 1,000 Units, Daily   folic acid (FOLVITE) 1 MG tablet TAKE 2 TABLETS BY MOUTH EVERY DAY   methotrexate 20 mg, Subcutaneous, Weekly   Rinvoq 15 mg, Oral, Daily   triamterene-hydrochlorothiazide (MAXZIDE-25) 37.5-25 MG tablet 1 tablet, Oral, Daily    ROS - all of the below systems have been reviewed with the patient and positives are indicated with bold text General: chills, fever or night sweats Eyes: blurry vision or double vision ENT: epistaxis or sore throat Allergy/Immunology: itchy/watery eyes or nasal congestion Hematologic/Lymphatic: bleeding problems, blood clots or swollen lymph nodes Endocrine: temperature intolerance or unexpected weight changes Breast: new or changing breast lumps or nipple discharge Resp: cough, shortness of breath, or wheezing CV: chest pain or dyspnea on exertion GI: as per HPI GU: dysuria, trouble voiding, or hematuria MSK: joint pain or joint stiffness Neuro: TIA or stroke symptoms Derm: pruritus and skin lesion changes Psych: anxiety and depression  Objective   PE There were no vitals taken for this visit. Constitutional: NAD; conversant; no deformities Eyes: Moist conjunctiva; no lid lag; anicteric; PERRL Neck: Trachea midline; no  thyromegaly Lungs: Normal respiratory effort; no tactile fremitus CV: RRR; no palpable thrills; no pitting edema GI: Abd soft, nontender; no palpable hepatosplenomegaly MSK: Normal range of motion of extremities; no clubbing/cyanosis Psychiatric: Appropriate affect; alert and oriented x3 Lymphatic: No palpable cervical or axillary lymphadenopathy  No results found for this or any previous visit (from the past 24 hours).  Imaging Orders  No imaging studies ordered today     Assessment and Plan   Annahi Meine is a 63 y.o. female who is seen for bariatric surgery consultation. The patient has morbid obesity with a BMI of Body mass index is 65.98 kg/m. and the following conditions related to obesity: Hypertension, Type 2 diabetes.  She also has rheumatoid arthritis.  Today we discussed the surgical options to treat obesity and its associated comorbidity. After discussing the available procedures in the region, we discussed in great detail the surgeries I offer: robotic sleeve gastrectomy and robotic roux-en-y gastric bypass. We discussed the procedures themselves as  well as their risks, benefits and alternatives. I entered the patient's basic information into the [facility] Metabolic Surgery Risk/Benefit Calculator to facilitate this discussion.  I also entered the patient's information into the [facility] Individualized Metabolic Surgery Score calculator to help explain surgery's effect on Type 2 Diabetes.   After a full discussion and all questions answered, the patient is interested in pursuing a robotic sleeve gastrectomy with upper endoscopy.  She completed the bariatric surgery preoperative pathway to include the following: - Bloodwork - Dietician consult - Chest x-ray - EKG - Psychology evaluation - Upper endoscopy with biopsy  Today she presents for surgery.  We again discussed robotic sleeve gastrectomy with upper endoscopy.  We discussed the procedure,  its risks, benefits and alternatives and the patient granted consent to proceed.    Quentin Ore, MD  [facility] Use [email or web address] to contact on call provider

## 2023-05-14 ENCOUNTER — Other Ambulatory Visit (HOSPITAL_COMMUNITY): Payer: Self-pay

## 2023-05-14 ENCOUNTER — Encounter (HOSPITAL_COMMUNITY): Payer: Self-pay | Admitting: Surgery

## 2023-05-14 ENCOUNTER — Other Ambulatory Visit: Payer: Self-pay

## 2023-05-14 LAB — CBC WITH DIFFERENTIAL/PLATELET
Abs Immature Granulocytes: 0.08 10*3/uL — ABNORMAL HIGH (ref 0.00–0.07)
Basophils Absolute: 0 10*3/uL (ref 0.0–0.1)
Basophils Relative: 0 %
Eosinophils Absolute: 0 10*3/uL (ref 0.0–0.5)
Eosinophils Relative: 0 %
HCT: 38.7 % (ref 36.0–46.0)
Hemoglobin: 11.4 g/dL — ABNORMAL LOW (ref 12.0–15.0)
Immature Granulocytes: 1 %
Lymphocytes Relative: 16 %
Lymphs Abs: 2 10*3/uL (ref 0.7–4.0)
MCH: 23.2 pg — ABNORMAL LOW (ref 26.0–34.0)
MCHC: 29.5 g/dL — ABNORMAL LOW (ref 30.0–36.0)
MCV: 78.7 fL — ABNORMAL LOW (ref 80.0–100.0)
Monocytes Absolute: 0.7 10*3/uL (ref 0.1–1.0)
Monocytes Relative: 5 %
Neutro Abs: 10 10*3/uL — ABNORMAL HIGH (ref 1.7–7.7)
Neutrophils Relative %: 78 %
Platelets: 392 10*3/uL (ref 150–400)
RBC: 4.92 MIL/uL (ref 3.87–5.11)
RDW: 15.3 % (ref 11.5–15.5)
WBC: 12.7 10*3/uL — ABNORMAL HIGH (ref 4.0–10.5)
nRBC: 0 % (ref 0.0–0.2)

## 2023-05-14 MED ORDER — HEPARIN SODIUM (PORCINE) 5000 UNIT/ML IJ SOLN
5000.0000 [IU] | Freq: Three times a day (TID) | INTRAMUSCULAR | 0 refills | Status: DC
  Filled 2023-05-14 – 2023-05-15 (×2): qty 42, 14d supply, fill #0

## 2023-05-14 MED ORDER — ONDANSETRON 4 MG PO TBDP
4.0000 mg | ORAL_TABLET | Freq: Four times a day (QID) | ORAL | 0 refills | Status: DC | PRN
  Filled 2023-05-14: qty 20, 5d supply, fill #0

## 2023-05-14 MED ORDER — GABAPENTIN 100 MG PO CAPS
100.0000 mg | ORAL_CAPSULE | Freq: Two times a day (BID) | ORAL | 0 refills | Status: DC
  Filled 2023-05-14: qty 10, 5d supply, fill #0

## 2023-05-14 MED ORDER — OXYCODONE HCL 5 MG PO TABS
5.0000 mg | ORAL_TABLET | Freq: Four times a day (QID) | ORAL | 0 refills | Status: DC | PRN
  Filled 2023-05-14: qty 10, 3d supply, fill #0

## 2023-05-14 MED ORDER — BD LUER-LOK SYRINGE 25G X 1" 3 ML MISC
0 refills | Status: DC
  Filled 2023-05-14: qty 42, 14d supply, fill #0

## 2023-05-14 MED ORDER — ENOXAPARIN SODIUM 60 MG/0.6ML IJ SOSY
60.0000 mg | PREFILLED_SYRINGE | Freq: Two times a day (BID) | INTRAMUSCULAR | 0 refills | Status: DC
Start: 2023-05-14 — End: 2023-05-14
  Filled 2023-05-14 (×2): qty 16.8, 14d supply, fill #0

## 2023-05-14 MED ORDER — HEPARIN SODIUM (PORCINE) 5000 UNIT/ML IJ SOLN
INTRAMUSCULAR | 0 refills | Status: DC
  Filled 2023-05-14: qty 2, 1d supply, fill #0
  Filled 2023-05-14: qty 40, 13d supply, fill #0

## 2023-05-14 MED ORDER — PANTOPRAZOLE SODIUM 40 MG PO TBEC
40.0000 mg | DELAYED_RELEASE_TABLET | Freq: Every day | ORAL | 0 refills | Status: DC
  Filled 2023-05-14: qty 90, 90d supply, fill #0

## 2023-05-14 MED ORDER — ACETAMINOPHEN 500 MG PO TABS
1000.0000 mg | ORAL_TABLET | Freq: Three times a day (TID) | ORAL | Status: AC
Start: 2023-05-14 — End: 2023-05-19

## 2023-05-14 MED ORDER — ENOXAPARIN (LOVENOX) PATIENT EDUCATION KIT
1.0000 | PACK | Freq: Once | 0 refills | Status: AC
  Filled 2023-05-14: qty 1, 1d supply, fill #0

## 2023-05-14 NOTE — Progress Notes (Signed)
 Patient was given discharge instructions include administration of heparin by Basil Dess RN, and all questions were answered. Patient was stable for discharge and was taken to the main exit by wheelchair.

## 2023-05-14 NOTE — Care Management Important Message (Signed)
 Important Message  Patient Details IM Letter given. Name: [name] MRN: [id number] Date of Birth: 09-Aug-1960   Important Message Given:  Yes - Medicare IM     Caren Macadam 05/14/2023, 2:18 PM

## 2023-05-14 NOTE — Progress Notes (Signed)
 Discharge medications delivered from outpatient [facility] pharmacy by this RN
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
## 2023-05-14 NOTE — Progress Notes (Signed)
 Patient alert and oriented, pain is controlled. Patient is tolerating fluids, advanced to protein shake today, patient is tolerating well. Reviewed Gastric sleeve/bypass discharge instructions with patient and patient is able to articulate understanding. Provided information on BELT program, Support Group, BSTOP-D, and [facility] outpatient pharmacy. Communicated general update of patient status to surgeon. All questions answered. 24hr fluid recall is 540 mL per hydration protocol, bariatric nurse coordinator to make follow-up phone call within one week.

## 2023-05-15 ENCOUNTER — Other Ambulatory Visit (HOSPITAL_COMMUNITY): Payer: Self-pay

## 2023-05-15 ENCOUNTER — Telehealth: Payer: Self-pay

## 2023-05-15 MED ORDER — BD SAFETYGLIDE NEEDLE 25G X 5/8" MISC
0 refills | Status: DC
Start: 2023-05-15 — End: 2023-11-27
  Filled 2023-05-15: qty 32, 32d supply, fill #0

## 2023-05-15 NOTE — Transitions of Care (Post Inpatient/ED Visit) (Signed)
 05/15/2023  Name: [name] MRN: [id number] DOB: November 19, 1960  Today's TOC FU Call Status: Today's TOC FU Call Status:: Successful TOC FU Call Completed TOC FU Call Complete Date: 05/15/23 Patient's Name and Date of Birth confirmed.  Transition Care Management Follow-up Telephone Call: Declined 30 day program but agreed/was okay with a one week follow up "anytime". Scheduled with TOC RN CM at 0930 on 05/23/23.  Date of Discharge: 05/13/23 Discharge Facility: [facility]) Type of Discharge: Inpatient Admission Primary Inpatient Discharge Diagnosis:: S/p Robotic Sleeve Gastrectomy How have you been since you were released from the hospital?: Better Any questions or concerns?: Yes Patient Questions/Concerns:: Patient had a few questions regarding vitamin D3 and calcium supplmentation she was told to take, Only Vit D3 on discharge AVS. Advised to speak to pharmacist today at [facility] when she picks up other medications and to ask about shorter needle for Heparin injections. Patient Questions/Concerns Addressed: Other: (Reviewed Medication list/reconciliation, Vit D3 1000 units ordered to start, Was told to take calcium as well. Informed of a calcium/D3 OTC available to to check with PCP or Pharmacist today when picking up other medication/syringe needle for Heparin.)  Items Reviewed: Did you receive and understand the discharge instructions provided?: Yes Medications obtained,verified, and reconciled?: Yes (Medications Reviewed) Dietary orders reviewed?: Yes Type of Diet Ordered:: Liquid diet unitl see Dietician on 05/27/23. Do you have support at home?: Yes People in Home: child(ren), adult, grandchild(ren) (LIves with [name], Daughter comes and goes as needed.) Name of Support/Comfort Primary Source: [name] (Daughter)  [phone] (Mobile)  Medications Reviewed Today: Medications Reviewed Today     Reviewed by Bing Quarry, RN (Case Manager) on  05/15/23 at [phone]  Med List Status: <None>   Medication Order Taking? Sig Documenting Provider Last Dose Status Informant  acetaminophen (TYLENOL) 500 MG tablet [id number] Yes Take 2 tablets (1,000 mg total) by mouth every 8 (eight) hours for 5 days. Stechschulte, Hyman Hopes, MD Taking Active   cholecalciferol (VITAMIN D3) 25 MCG (1000 UNIT) tablet [id number] Yes Take 1,000 Units by mouth daily. [provider] Taking Active Self  enoxaparin (LOVENOX) KIT [id number] Yes 1 kit by Does not apply route once for 1 dose. Stechschulte, Hyman Hopes, MD Taking Active   folic acid (FOLVITE) 1 MG tablet [id number] Yes TAKE 2 TABLETS BY MOUTH EVERY DAY  Patient taking differently: Take 1 mg by mouth daily.   Gearldine Bienenstock, PA-C Taking Active Self  furosemide (LASIX) 20 MG tablet [id number]  Take 20 mg by mouth. [provider]  Active            Med Note [name], [name]   Thu May 15, 2023  9:13 AM) When legs swell and get SOB.   gabapentin (NEURONTIN) 100 MG capsule [id number] Yes Take 1 capsule (100 mg total) by mouth every 12 (twelve) hours for 5 days. Stechschulte, Hyman Hopes, MD Taking Active   Heparin Sodium, Porcine, PF 5000 UNIT/ML SOLN [id number] Yes Inject 5,000 Units into the skin 3 (three) times daily for 14 days. Stechschulte, Hyman Hopes, MD Taking Active            Med Note [name], [name]   Thu May 15, 2023  9:15 AM) Needs smaller needle size.   methotrexate 50 MG/2ML injection [id number] Yes Inject 0.8 mLs (20 mg total) into the skin once a week. [name], MD Taking Active Self  ondansetron (ZOFRAN-ODT) 4 MG disintegrating tablet [id number] Yes Take 1 tablet (4 mg  total) by mouth every 6 (six) hours as needed for nausea or vomiting. Stechschulte, Hyman Hopes, MD Taking Active   oxyCODONE (OXY IR/ROXICODONE) 5 MG immediate release tablet [id number] Yes Take 1 tablet (5 mg total) by mouth every 6 (six) hours as needed for breakthrough pain or severe pain (pain score 7-10). Stechschulte, Hyman Hopes, MD  Taking Active   pantoprazole (PROTONIX) 40 MG tablet [id number] Yes Take 1 tablet (40 mg total) by mouth daily. Stechschulte, Hyman Hopes, MD Taking Active   SYRINGE-NEEDLE, DISP, 3 ML (B-D 3CC LUER-LOK SYR 25GX1") 25G X 1" 3 ML MISC [id number] Yes Use three times daily to inject heparin  Taking Active   triamterene-hydrochlorothiazide (MAXZIDE-25) 37.5-25 MG tablet [id number]  TAKE 1 TABLET BY MOUTH EVERY DAY Kerri Perches, MD  Active Self           Med Note [name], [name]   Thu May 15, 2023  9:20 AM) Surgeon told to stop taking 1 week before surgery.   Upadacitinib ER (RINVOQ) 15 MG TB24 [id number] Yes Take 1 tablet (15 mg total) by mouth daily. [name], MD Taking Active Self            Home Care and Equipment/Supplies: Were Home Health Services Ordered?: NA Any new equipment or medical supplies ordered?: NA  Functional Questionnaire: Do you need assistance with bathing/showering or dressing?: Yes (Some post op soreness in getting up and down but able to perform activity. Also has RN with some morning stiffness. NO [facility] or post discharge therapies ordered.) Do you need assistance with meal preparation?: No Do you need assistance with eating?: No Do you have difficulty maintaining continence: No Do you need assistance with getting out of bed/getting out of a chair/moving?: Yes Do you have difficulty managing or taking your medications?: No  Follow up appointments reviewed: PCP Follow-up appointment confirmed?: Yes Date of PCP follow-up appointment?: 06/03/23 (Made this appointment prior to admission for elective surgery. Explained importance of trying to see PCP within 2 week. Patient will call PCP and see if appointment can be moved up for a hospital follow up visit.) Follow-up Provider: [name] Specialist [facility] Follow-up appointment confirmed?: Yes Date of Specialist follow-up appointment?: 05/05/23 Follow-Up Specialty Provider:: Dr. Dossie Der General  Surgeon Do you need transportation to your follow-up appointment?: No Do you understand care options if your condition(s) worsen?: Yes-patient verbalized understanding  SDOH Interventions Today    Flowsheet Row Most Recent Value  SDOH Interventions   Health Literacy Interventions Intervention Not Indicated      Interventions Today    Flowsheet Row Most Recent Value  Chronic Disease   Chronic disease during today's visit --  [States she have never been told she has CHF, but takes Lasix PRN if legs swell or she becomes SOB. Has a blood pressure cuff and is to start taking BP at home daily and recording in notebook or phone, discussed importance of capturing trends in BP.]  General Interventions   General Interventions Discussed/Reviewed Doctor Visits  Vaccines Flu  Doctor Visits Discussed/Reviewed Doctor Visits Discussed, Doctor Visits Reviewed, PCP, Specialist  PCP/Specialist Visits Compliance with follow-up visit  [Already has appointment scheduled but will call PCP to see if can be moved up at week.]  Exercise Interventions   Exercise Discussed/Reviewed Physical Activity  Physical Activity Discussed/Reviewed Physical Activity Discussed  Mental Health Interventions   Mental Health Discussed/Reviewed --  [Deferred, did not need.]  Nutrition Interventions   Nutrition Discussed/Reviewed Nutrition Discussed  [LIquid diet unitl seen  by Dietician for post op discussion on 05/27/23.]  Pharmacy Interventions   Pharmacy Dicussed/Reviewed Pharmacy Topics Discussed, Medication Adherence, Affording Medications, Medications and their functions  Safety Interventions   Safety Discussed/Reviewed Fall Risk, Safety Discussed  Advanced Directive Interventions   Advanced Directives Discussed/Reviewed --  [Planning with adult children in progress.]      TOC Interventions Today    Flowsheet Row Most Recent Value  TOC Interventions   TOC Interventions Discussed/Reviewed Post discharge activity  limitations per provider     05/15/23: [facility] RN CM completed a call with patient. History of RA, HTN, obesity, Had Robotic Sleeve Gastrectomy on 05/13/23; an inpatient only procedure. LOS 1 day. Patient stated she was doing well, is engaged in her self care, had made a PCP appointment prior to elective surgery, but was going to call to see if it can be moved up a week as it fell outside the 14 day window preferred for post discharge hospital follow up with primary care provider. Now scheduled on 06/03/23.   Follow up with general surgery scheduled for 06/02/23 and again 07/15/23 with PA visit. Dietician visit on 05/27/23 for post op diet/nutrition needs, on liquid diet until then.   Patient has a blood pressure cuff for home use and will start taking daily as a goal and logging on phone or notebook to take to providers visits for blood pressure trends after this was discussed. She does not have to check blood sugars at this time. Eye exam 5 months ago. Reviewed vaccines--to discuss with PCP.   Reviewed insurance benefits regarding medications and alternative resources for cost reduction--pharmacy worked with patient on lower cost Heparin before discharge patient stated. Will be going to [facility] today to pick up medications and will ask about Vit. D3/calcium combination and about a different syringe with shorter needle for Heparin injections.   Patient understands what her medication regimen is and what medications are for. Stated she did not mind a call back in a week, and told another TOC RN CM may call her back, but gave her this RN CM number in the meantime for any non urgent needs/voice mail. Patient has MyChart activated as well.   Declined 30 day, but was okay with one week follow up call to ensure PCP follow up moved up.  Scheduled follow up call on 05/23/23 at 0930 am with Lonia Chimera [facility] RN CM.    Gabriel Cirri MSN, RN RN Case Sales executive Health  [facility]  [facility] Office Hours Wed/Thur  8:00 am-6:00 pm Direct Dial: [phone] Main Phone [phone]  Fax: [phone] [email or web address]

## 2023-05-16 LAB — SURGICAL PATHOLOGY

## 2023-05-16 NOTE — Telephone Encounter (Signed)
 Called [employer] for updating on [employer] patient assistance application. They were unable to pull income through and they had advised patent that they would need to provide proof of income (they spoke with patient on 05/09/2023)  Phone: [phone] Fax: [phone]  Chesley Mires, PharmD, MPH, BCPS, CPP Clinical Pharmacist (Rheumatology and Pulmonology)

## 2023-05-20 ENCOUNTER — Telehealth (HOSPITAL_COMMUNITY): Payer: Self-pay | Admitting: *Deleted

## 2023-05-20 ENCOUNTER — Other Ambulatory Visit: Payer: Self-pay | Admitting: Rheumatology

## 2023-05-20 NOTE — Telephone Encounter (Signed)
 1. Tell me about your pain and pain management?    Pt c/o right lower abdominal incisional pain with movement and exertion.   Pt states that s/he has been taking some of the oxycodone to help  Encouraged pt to try heat or cold therapy to help as well. Pt receptive.     2. Let's talk about fluid intake. How much total fluid are you taking in?   Pt states that she is working to meet goal of 64 oz of fluid today. Pt has been able to consume approx. 40 oz of fluid per day since surgery. Pt plans to increase clear liquids to meet fluid goals.     3. How much protein have you taken in the last day?     Pt states she is meeting the goal of 60g of protein each day with the protein shakes and yogurt    4. Have you had nausea? Tell me about when you have experienced nausea and what you did to help?   Pt had one vomiting episode on Saturday -- but not since then.  No Nausea at this time  6. Tell me what your incisions look like?   "Incisions look fine". Pt denies a fever, chills. Pt states incisions are not swollen, open, or draining. Pt encouraged to call [facility] if incisions change.     7. Have you been passing gas? BM?   Pt states that they are having BMs. Still feeling constipated, taking Mirilax daily to help  Pt states that they have had a BM. Pt instructed to take either Miralax or MoM as instructed per "Gastric Bypass/Sleeve Discharge Home Care Instructions". Pt to call surgeon's office if not able to have BM with medication.    8. If a problem or question were to arise who would you call? Do you know contact numbers for [facility], [facility], and [facility]?   Pt knows to call [facility] for surgical, [facility] for nutrition, and [facility] for non-urgent questions or concerns. Pt denies dehydration symptoms. Pt can describe s/sx of dehydration.   9. How has the walking going?   Pt states s/he is walking around and able to be active without difficulty.   10. How are your vitamins and calcium going? How are you  taking them?    Pt states she does not like the taste but that s/he is taking his/her supplements and vitamins without difficulty.    11. How has the anticoagulant Heparin been going?   Pt states that s/he is taking the Heparin injections without difficulty. Reinforced education about taking injections 3x per day and rotating injection sites. Pt also instructed to monitor for unusual bruising and/or signs of bleeding.

## 2023-05-20 NOTE — Telephone Encounter (Signed)
 Next Visit: 06/09/2023  Last Visit: 01/09/2023  Dx: Rheumatoid arthritis of multiple sites with negative rheumatoid factor   Current Dose per office note on 01/09/2023: Patient on MTX 1.0 ml sq injections once weekly   Okay to refill Syringes?

## 2023-05-23 ENCOUNTER — Other Ambulatory Visit: Payer: Self-pay | Admitting: *Deleted

## 2023-05-23 ENCOUNTER — Encounter: Payer: Self-pay | Admitting: *Deleted

## 2023-05-23 NOTE — Patient Outreach (Signed)
 Care Management  Transitions of Care Program Transitions of Care Post-discharge follow up call   05/23/2023 Name: [name] MRN: [id number] DOB: [birth year]  Subjective: [name] is a 63 y.o. year old female who is a primary care patient of Kerri Perches, MD. The Care Management team Engaged with patient Engaged with patient by telephone to assess and address transitions of care needs.   Consent to Services:  Patient agreed to one more outreach today 05/23/23, declined enrollment in TOC 30 day program  Assessment: Patient reports she is feeling much better, not quite as sore, ambulating without difficulty, appetite good, using miralax daily to prevent straining with good results, pt to see primary care provider on 06/03/23, surgeon on 06/05/23, Dietician on 05/27/23.  Patient states she has all medications and taking as prescribed, no new concerns reported.  Reviewed importance of calling provider early on for change in health status/ symptoms, questions or concerns.         SDOH Interventions    Flowsheet Row Telephone from 05/15/2023 in [place] POPULATION [facility] DEPARTMENT Clinical Support from 09/04/2022 in [facility] Primary Care Clinical Support from 08/02/2020 in [facility] Primary Care Virtual [facility] Phone Follow Up from 08/26/2017 in [facility] Primary Care Office Visit from 10/02/2016 in [facility] Primary Care Office Visit from 06/14/2014 in [facility] Primary Care  SDOH Interventions        Food Insecurity Interventions -- Intervention Not Indicated Intervention Not Indicated -- -- --  Housing Interventions -- Intervention Not Indicated Intervention Not Indicated -- -- --  Transportation Interventions -- Intervention Not Indicated Intervention Not Indicated -- -- --  Utilities Interventions -- Intervention Not Indicated -- -- -- --  Alcohol Usage Interventions -- Intervention Not Indicated (Score <7) -- --  -- --  Depression Interventions/Treatment  -- -- -- Counseling Referral to Psychiatry, Medication, Counseling Referral to Psychiatry, Counseling  Financial Strain Interventions -- Intervention Not Indicated Intervention Not Indicated -- -- --  Physical Activity Interventions -- Intervention Not Indicated Intervention Not Indicated -- -- --  Stress Interventions -- Intervention Not Indicated Intervention Not Indicated -- -- --  Social Connections Interventions -- Intervention Not Indicated Intervention Not Indicated -- -- --  Health Literacy Interventions Intervention Not Indicated -- -- -- -- --        Goals Addressed   None     Plan: The patient has been provided with contact information for the care management team and has been advised to call with any health related questions or concerns.   No further follow up.  Irving Shows [facility], BSN RN Care Manager/ Transition of Care [place]/ [facility] [phone]
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
## 2023-05-27 ENCOUNTER — Encounter: Payer: Self-pay | Admitting: Dietician

## 2023-05-27 ENCOUNTER — Encounter: Payer: Medicare PPO | Attending: Surgery | Admitting: Dietician

## 2023-05-27 VITALS — Ht 61.0 in | Wt 337.3 lb

## 2023-05-27 DIAGNOSIS — Z713 Dietary counseling and surveillance: Secondary | ICD-10-CM | POA: Insufficient documentation

## 2023-05-27 DIAGNOSIS — Z6841 Body Mass Index (BMI) 40.0 and over, adult: Secondary | ICD-10-CM | POA: Diagnosis not present

## 2023-05-27 DIAGNOSIS — E669 Obesity, unspecified: Secondary | ICD-10-CM | POA: Insufficient documentation

## 2023-05-27 NOTE — Progress Notes (Signed)
 2 Week Post-Operative Nutrition Class   Patient was seen on 05/27/2023 for Post-Operative Nutrition education at the [facility].    Surgery date: 05/13/2023 Surgery type: Sleeve Gastrectomy  Anthropometrics  Start weight at [facility]: 351.0 lbs (date: 09/24/2022)  Height: 61 in Weight today: 337.3 lb  Clinical  Medical hx: obesity; HTN Medications: folic acid, vit D, phentermine, lasix, Maxzide, rinvoq Labs: B12 1,882; A1c 6.0; iron saturation 13;  Notable signs/symptoms: none noted Any previous deficiencies? No Allergies: penicillin, aleve (naproxen) Bowel Habits: Every day to every other day no complaints   Body Composition Scale 05/27/2023  Current Body Weight 337.3  Total Body Fat % 54.2  Visceral Fat 33  Fat-Free Mass % 45.7   Total Body Water % 37.3  Muscle-Mass lbs 28.1  BMI 64.1  Body Fat Displacement          Torso  lbs 113.6         Left Leg  lbs 22.7         Right Leg  lbs 22.7         Left Arm  lbs 11.3         Right Arm  lbs 11.3      The following the learning objectives were met by the patient during this course: Identifies Soft Prepped Plan Advancement Guide  Identifies Soft, High Proteins (Phase 1), beginning 2 weeks post-operatively to 3 weeks post-operatively Identifies Additional Soft High Proteins, soft non-starchy vegetables, fruits and starches (Phase 2), beginning 3 weeks post-operatively to 3 months post-operatively Identifies appropriate sources of fluids, proteins, vegetables, fruits and starches Identifies appropriate fat sources and healthy verses unhealthy fat types   States protein, vegetable, fruit and starch recommendations and appropriate sources post-operatively Identifies the need for appropriate texture modifications, mastication, and bite sizes when consuming solids Identifies appropriate fat consumption and sources Identifies appropriate multivitamin and calcium sources post-operatively Describes the need  for physical activity post-operatively and will follow MD recommendations States when to call healthcare provider regarding medication questions or post-operative complications   Handouts given during class include: Soft Prepped Plan Advancement Guide   Follow-Up Plan: Patient will follow-up at [facility] in 10 weeks for 3 month post-op nutrition visit for diet advancement per MD.

## 2023-05-29 ENCOUNTER — Inpatient Hospital Stay: Payer: Self-pay | Admitting: Family Medicine

## 2023-05-29 NOTE — Telephone Encounter (Signed)
 Received fax from [place] stating patient's Rinvoq is approved but no dates. I called Denyse Amass - they state that the fax was mistakenly sent out. Patient actually is not approved. She needs to apply for LIS and be denied to be screened for [employer]. Rep states that they spoke with patient yesterday 05/28/23 to discuss application for LIS.  Chesley Mires, PharmD, MPH, BCPS, CPP Clinical Pharmacist (Rheumatology and Pulmonology)

## 2023-06-02 ENCOUNTER — Telehealth (HOSPITAL_COMMUNITY): Payer: Self-pay | Admitting: *Deleted

## 2023-06-02 NOTE — Telephone Encounter (Signed)
 Patient called to inquire into the dietary phase and what to eat at this time. Patient reminded of guidelines for the dietary phase and encouraged to reach out to dietician (of which she replied that she would give Wynona Dove a call today).

## 2023-06-03 ENCOUNTER — Ambulatory Visit (INDEPENDENT_AMBULATORY_CARE_PROVIDER_SITE_OTHER): Payer: Self-pay | Admitting: Family Medicine

## 2023-06-03 ENCOUNTER — Encounter: Payer: Self-pay | Admitting: Family Medicine

## 2023-06-03 VITALS — BP 136/82 | HR 61 | Ht 61.0 in | Wt 332.1 lb

## 2023-06-03 DIAGNOSIS — M0600 Rheumatoid arthritis without rheumatoid factor, unspecified site: Secondary | ICD-10-CM | POA: Diagnosis not present

## 2023-06-03 DIAGNOSIS — I10 Essential (primary) hypertension: Secondary | ICD-10-CM

## 2023-06-03 DIAGNOSIS — Z13220 Encounter for screening for lipoid disorders: Secondary | ICD-10-CM | POA: Diagnosis not present

## 2023-06-03 DIAGNOSIS — E559 Vitamin D deficiency, unspecified: Secondary | ICD-10-CM

## 2023-06-03 DIAGNOSIS — Z23 Encounter for immunization: Secondary | ICD-10-CM | POA: Diagnosis not present

## 2023-06-03 DIAGNOSIS — R7303 Prediabetes: Secondary | ICD-10-CM | POA: Diagnosis not present

## 2023-06-03 MED ORDER — TRIAMTERENE-HCTZ 37.5-25 MG PO TABS: ORAL_TABLET | ORAL | 3 refills | Status: DC

## 2023-06-03 NOTE — Assessment & Plan Note (Signed)
 Updated lab needed at/ before next visit.

## 2023-06-03 NOTE — Assessment & Plan Note (Signed)
  Patient re-educated about  the importance of commitment to a  minimum of 150 minutes of exercise per week as able.  The importance of healthy food choices with portion control discussed, as well as eating regularly and within a 12 hour window most days. The need to choose "clean , green" food 50 to 75% of the time is discussed, as well as to make water the primary drink and set a goal of 64 ounces water daily.       06/03/2023    3:46 PM 05/27/2023    4:20 PM 05/13/2023    7:59 AM  Weight /BMI  Weight 332 lb 1.9 oz 337 lb 4.8 oz 349 lb 9.6 oz  Height  5\' 1"  (1.549 m)   BMI 62.75 kg/m2 63.73 kg/m2 66.06 kg/m2    Improved, continue current

## 2023-06-03 NOTE — Assessment & Plan Note (Signed)
 Managed by Rheumatology.

## 2023-06-03 NOTE — Assessment & Plan Note (Signed)
 After obtaining informed consent, the pneumonia 20  vaccine is  administered , with no adverse effect noted at the time of administration.

## 2023-06-03 NOTE — Assessment & Plan Note (Signed)
 Patient educated about the importance of limiting  Carbohydrate intake , the need to commit to daily physical activity for a minimum of 30 minutes , and to commit weight loss. The fact that changes in all these areas will reduce or eliminate all together the development of diabetes is stressed.  Updated lab needed at/ before next visit.      Latest Ref Rng & Units 05/13/2023    4:07 PM 05/06/2023    1:54 PM 03/14/2023    8:39 AM 12/13/2022    8:24 AM 12/05/2022    9:06 AM  Diabetic Labs  HbA1c 4.8 - 5.6 %     6.4   Creatinine 0.44 - 1.00 mg/dL 8.29  5.62  1.30  8.65        06/03/2023    4:30 PM 06/03/2023    3:46 PM 05/27/2023    4:20 PM 05/14/2023    1:43 PM 05/14/2023   10:12 AM 05/14/2023    6:37 AM 05/14/2023    2:07 AM  BP/Weight  Systolic BP 136 140  134 118 124 109  Diastolic BP 82 82  52 68 53 51  Wt. (Lbs)  332.12 337.3      BMI  62.75 kg/m2 63.73 kg/m2          10/12/2019    2:20 PM 07/31/2015    8:45 AM  Foot/eye exam completion dates  Foot Form Completion Done Done

## 2023-06-03 NOTE — Assessment & Plan Note (Signed)
 Resume triamterene 37.5 mg HALF tablket daily Check at home daily call with concerns DASH diet and commitment to daily physical activity for a minimum of 30 minutes discussed and encouraged, as a part of hypertension management. The importance of attaining a healthy weight is also discussed.     06/03/2023    4:30 PM 06/03/2023    3:46 PM 05/27/2023    4:20 PM 05/14/2023    1:43 PM 05/14/2023   10:12 AM 05/14/2023    6:37 AM 05/14/2023    2:07 AM  BP/Weight  Systolic BP 136 140  134 118 124 109  Diastolic BP 82 82  52 68 53 51  Wt. (Lbs)  332.12 337.3      BMI  62.75 kg/m2 63.73 kg/m2

## 2023-06-03 NOTE — Patient Instructions (Addendum)
 F/U in 2 months, call if you need me  before   CONGRATS on weight loss success with surgery and discipline, keep it  Check blood pressure regularly call with concerns, every 1 to 2 days   Start half triamterene once daily for blood pressure  Pneumonia 20 today  Fasting lipid, cmp and EGFr, Vit D, HBA1C and TSH 3 to 5 days before follow up (nurse pls order)  Thanks for choosing [facility], we consider it a privelige to serve you.

## 2023-06-03 NOTE — Progress Notes (Signed)
 [name]     MRN: [id number]      DOB: Mar 29, 1960  Chief Complaint  Patient presents with   Follow-up    8-10 wk f/u  Wants to discuss if she should restart B/P Medication     HPI [name] is here for follow up and re-evaluation of chronic medical conditions, medication management and review of any available recent lab and radiology data.  Preventive health is updated, specifically  Cancer screening and Immunization.   No complication from recent gastric bypass with good weight loss success Has noted fluctuation in blood pressure and is concerned that she may need to resume medication ROS Denies recent fever or chills. Denies sinus pressure, nasal congestion, ear pain or sore throat. Denies chest congestion, productive cough or wheezing. Denies chest pains, palpitations and leg swelling Denies abdominal pain, nausea, vomiting,diarrhea or constipation.   Denies dysuria, frequency, hesitancy or incontinence. Chronic joint pain, swelling and limitation in mobility. Denies headaches, seizures, numbness, or tingling. Denies depression, anxiety or insomnia. Denies skin break down or rash.   PE  BP 136/82   Pulse 61   Wt (!) 332 lb 1.9 oz (150.6 kg)   SpO2 94%   BMI 62.75 kg/m   Patient alert and oriented and in no cardiopulmonary distress.  HEENT: No facial asymmetry, EOMI,     Neck supple .  Chest: Clear to auscultation bilaterally.  CVS: S1, S2 no murmurs, no S3.Regular rate.  ABD: Soft non tender.   Ext: No edema  MS: Adequate though reduced  ROM spine, shoulders, hips and knees.  Skin: Intact, no ulcerations or rash noted.  Psych: Good eye contact, normal affect. Memory intact not anxious or depressed appearing.  CNS: CN 2-12 intact, power,  normal throughout.no focal deficits noted.   Assessment & Plan  Essential hypertension Resume triamterene 37.5 mg HALF tablket daily Check at home daily call with concerns DASH diet and commitment to  daily physical activity for a minimum of 30 minutes discussed and encouraged, as a part of hypertension management. The importance of attaining a healthy weight is also discussed.     06/03/2023    4:30 PM 06/03/2023    3:46 PM 05/27/2023    4:20 PM 05/14/2023    1:43 PM 05/14/2023   10:12 AM 05/14/2023    6:37 AM 05/14/2023    2:07 AM  BP/Weight  Systolic BP 136 140  134 118 124 109  Diastolic BP 82 82  52 68 53 51  Wt. (Lbs)  332.12 337.3      BMI  62.75 kg/m2 63.73 kg/m2           Morbid obesity (HCC)  Patient re-educated about  the importance of commitment to a  minimum of 150 minutes of exercise per week as able.  The importance of healthy food choices with portion control discussed, as well as eating regularly and within a 12 hour window most days. The need to choose "clean , green" food 50 to 75% of the time is discussed, as well as to make water the primary drink and set a goal of 64 ounces water daily.       06/03/2023    3:46 PM 05/27/2023    4:20 PM 05/13/2023    7:59 AM  Weight /BMI  Weight 332 lb 1.9 oz 337 lb 4.8 oz 349 lb 9.6 oz  Height  5\' 1"  (1.549 m)   BMI 62.75 kg/m2 63.73 kg/m2 66.06 kg/m2  Improved, continue current  Vitamin D deficiency Updated lab needed at/ before next visit.   Prediabetes Patient educated about the importance of limiting  Carbohydrate intake , the need to commit to daily physical activity for a minimum of 30 minutes , and to commit weight loss. The fact that changes in all these areas will reduce or eliminate all together the development of diabetes is stressed.  Updated lab needed at/ before next visit.      Latest Ref Rng & Units 05/13/2023    4:07 PM 05/06/2023    1:54 PM 03/14/2023    8:39 AM 12/13/2022    8:24 AM 12/05/2022    9:06 AM  Diabetic Labs  HbA1c 4.8 - 5.6 %     6.4   Creatinine 0.44 - 1.00 mg/dL 4.09  8.11  9.14  7.82        06/03/2023    4:30 PM 06/03/2023    3:46 PM 05/27/2023    4:20 PM 05/14/2023     1:43 PM 05/14/2023   10:12 AM 05/14/2023    6:37 AM 05/14/2023    2:07 AM  BP/Weight  Systolic BP 136 140  134 118 124 109  Diastolic BP 82 82  52 68 53 51  Wt. (Lbs)  332.12 337.3      BMI  62.75 kg/m2 63.73 kg/m2          10/12/2019    2:20 PM 07/31/2015    8:45 AM  Foot/eye exam completion dates  Foot Form Completion Done Done      Encounter for immunization After obtaining informed consent, the pneumonia 20 vaccine is  administered , with no adverse effect noted at the time of administration.   Rheumatoid arthritis with negative rheumatoid factor (HCC) Managed by Rheumatology

## 2023-06-05 ENCOUNTER — Telehealth: Payer: Self-pay | Admitting: Dietician

## 2023-06-05 NOTE — Telephone Encounter (Signed)
 RD called pt to verify fluid intake once starting soft, solid proteins 2 week post-bariatric surgery.   Daily Fluid intake: 64 oz Daily Protein intake: 60 grams Bowel Habits: good; taking Miralax  Concerns/issues: pt states food is getting stuck, stating she is seeing her surgeon today. Dietitian reiterated to pt to take small bites and chew food well. If you are not tolerating a food, avoid it for awhile and try it again in several days or weeks.

## 2023-06-09 ENCOUNTER — Ambulatory Visit: Payer: Medicare PPO | Attending: Physician Assistant | Admitting: Physician Assistant

## 2023-06-09 ENCOUNTER — Encounter: Payer: Self-pay | Admitting: Physician Assistant

## 2023-06-09 VITALS — BP 111/77 | HR 62 | Resp 14 | Ht 61.5 in | Wt 329.0 lb

## 2023-06-09 DIAGNOSIS — Z79899 Other long term (current) drug therapy: Secondary | ICD-10-CM

## 2023-06-09 DIAGNOSIS — M5030 Other cervical disc degeneration, unspecified cervical region: Secondary | ICD-10-CM

## 2023-06-09 DIAGNOSIS — M19041 Primary osteoarthritis, right hand: Secondary | ICD-10-CM

## 2023-06-09 DIAGNOSIS — R7303 Prediabetes: Secondary | ICD-10-CM

## 2023-06-09 DIAGNOSIS — M19042 Primary osteoarthritis, left hand: Secondary | ICD-10-CM

## 2023-06-09 DIAGNOSIS — M0609 Rheumatoid arthritis without rheumatoid factor, multiple sites: Secondary | ICD-10-CM

## 2023-06-09 DIAGNOSIS — I5032 Chronic diastolic (congestive) heart failure: Secondary | ICD-10-CM

## 2023-06-09 DIAGNOSIS — Z8669 Personal history of other diseases of the nervous system and sense organs: Secondary | ICD-10-CM

## 2023-06-09 DIAGNOSIS — Z13220 Encounter for screening for lipoid disorders: Secondary | ICD-10-CM

## 2023-06-09 DIAGNOSIS — M47816 Spondylosis without myelopathy or radiculopathy, lumbar region: Secondary | ICD-10-CM

## 2023-06-09 DIAGNOSIS — M19072 Primary osteoarthritis, left ankle and foot: Secondary | ICD-10-CM

## 2023-06-09 DIAGNOSIS — Z8719 Personal history of other diseases of the digestive system: Secondary | ICD-10-CM

## 2023-06-09 DIAGNOSIS — Z8659 Personal history of other mental and behavioral disorders: Secondary | ICD-10-CM

## 2023-06-09 DIAGNOSIS — I10 Essential (primary) hypertension: Secondary | ICD-10-CM

## 2023-06-09 DIAGNOSIS — Z903 Acquired absence of stomach [part of]: Secondary | ICD-10-CM

## 2023-06-09 DIAGNOSIS — M19071 Primary osteoarthritis, right ankle and foot: Secondary | ICD-10-CM | POA: Diagnosis not present

## 2023-06-09 DIAGNOSIS — E890 Postprocedural hypothyroidism: Secondary | ICD-10-CM

## 2023-06-09 DIAGNOSIS — E559 Vitamin D deficiency, unspecified: Secondary | ICD-10-CM

## 2023-06-09 DIAGNOSIS — Z96653 Presence of artificial knee joint, bilateral: Secondary | ICD-10-CM

## 2023-06-09 DIAGNOSIS — Z8261 Family history of arthritis: Secondary | ICD-10-CM

## 2023-06-09 NOTE — Patient Instructions (Signed)
 Standing Labs We placed an order today for your standing lab work.   Please have your standing labs drawn in [place] and every 3 months   Please have your labs drawn 2 weeks prior to your appointment so that the provider can discuss your lab results at your appointment, if possible.  Please note that you [place] see your imaging and lab results in MyChart before we have reviewed them. We will contact you once all results are reviewed. Please allow our office up to 72 hours to thoroughly review all of the results before contacting the office for clarification of your results.  WALK-IN LAB HOURS  Monday through Thursday from 8:00 am -12:30 pm and 1:00 pm-5:00 pm and Friday from 8:00 am-12:00 pm.  Patients with office visits requiring labs will be seen before walk-in labs.  You [place] encounter longer than normal wait times. Please allow additional time. Wait times [place] be shorter on  Monday and Thursday afternoons.  We do not book appointments for walk-in labs. We appreciate your patience and understanding with our staff.   Labs are drawn by [facility]. Please bring your co-pay at the time of your lab draw.  You [place] receive a bill from [facility] for your lab work.  Please note if you are on Hydroxychloroquine and and an order has been placed for a Hydroxychloroquine level,  you will need to have it drawn 4 hours or more after your last dose.  If you wish to have your labs drawn at another location, please call the office 24 hours in advance so we can fax the orders.  The office is located at [address], [address], [place], [place] [address]   If you have any questions regarding directions or hours of operation,  please call [phone].   As a reminder, please drink plenty of water prior to coming for your lab work. Thanks!

## 2023-06-09 NOTE — Progress Notes (Signed)
 Medication Samples have been provided to the patient.  Drug name: rinvoq       Strength: 15mg         Qty: 2   LOT: [id number]  Exp.Date: 02/01/2025  Dosing instructions: Take 1 tablet by mouth daily.

## 2023-06-11 ENCOUNTER — Telehealth: Payer: Self-pay | Admitting: *Deleted

## 2023-06-11 NOTE — Telephone Encounter (Signed)
 Patient advised per Dr. Corliss Skains no radiographic changes on X-rays. Patient expressed understanding.

## 2023-07-01 ENCOUNTER — Telehealth: Payer: Self-pay | Admitting: Skilled Nursing Facility1

## 2023-07-01 DIAGNOSIS — E669 Obesity, unspecified: Secondary | ICD-10-CM

## 2023-07-01 NOTE — Telephone Encounter (Signed)
 Called pt due to email stating she is having issues keeping her food down.   LVM

## 2023-07-01 NOTE — Telephone Encounter (Signed)
 Returned pts call.  Pt states she has been throwing up the past few weeks once every other day.   Foods are Not tolerated and then tolerated the next day   Saturday: Half bowl oatmeal + splenda Protein shake String cheese Gatorade  Olive garden soup Water Jello  3 12 ounces water per day  Taking Mirilax every other day, taking Protonix daily stating pills go down separately without an issues   No complaints of headaches or fatigue  Pt states she thinks it just might be her nerves with her grandson bothering her   Dietitian advised if she does not reach her fluid goals or her issues progress to please call her surgeon immediately   Advice: Measure out your food with n actual measuring cup tot ensure you are not overeating per meal time Try warm broth and herbal teas

## 2023-07-10 ENCOUNTER — Telehealth: Payer: Self-pay | Admitting: *Deleted

## 2023-07-10 NOTE — Telephone Encounter (Signed)
 Patient contacted the office requesting samples of Rinvoq . Patient has completed her My [employer] paperwork and has went to [employer] and applied for the LIS about 3 weeks ago. Patient states she has not received a response. Patient advised she may come by the office to pick up samples. Patient reminded she is due for labs next month. Patient states she will try to come today if not it will be Monday. Sample reserved in cabinet for patient.

## 2023-07-16 ENCOUNTER — Other Ambulatory Visit (HOSPITAL_COMMUNITY): Payer: Self-pay

## 2023-07-16 NOTE — Telephone Encounter (Signed)
 Medication Samples have been provided to the patient.  Drug name: Rinvoq        Strength: 15 mg        Qty: 2  LOT: [id number]  Exp.Date: 01/2025  Dosing instructions: Take one tablet by mouth daily.

## 2023-07-18 ENCOUNTER — Other Ambulatory Visit (HOSPITAL_COMMUNITY): Payer: Self-pay

## 2023-07-22 NOTE — Telephone Encounter (Signed)
 Spoke with patient to discuss Medicare Extra Help application status. Per patient she did submit her application. She has not received an official denial letter yet. Review of previous telephone encounters revealed she submitted her application in early April 2025.   Tolu Disney Ruggiero, PharmD [facility] PGY-1

## 2023-07-28 NOTE — Telephone Encounter (Signed)
 Spoke with patient and she stated she has not yet received an official denial letter for her Extra Help application. She will contact the clinic if she receives any new information.   Tolu Zerline Melchior, PharmD [facility] PGY-1

## 2023-08-01 MED ORDER — RINVOQ 15 MG PO TB24
15.0000 mg | ORAL_TABLET | Freq: Every day | ORAL | 0 refills | Status: DC
Start: 2023-08-01 — End: 2023-08-27
  Filled 2023-08-06: qty 30, 30d supply, fill #0

## 2023-08-01 NOTE — Telephone Encounter (Signed)
 Patient enrolled into grant for RA through [employer] Award Period: 02/02/2023 - 07/31/2024 Cardholder: [id number] BIN: [id number] PCN: [id number] Group: [id number] For pharmacy inquiries, contact PDMI at [phone]. For patient inquiries, contact PAF at [phone].  Can fill through [facility]

## 2023-08-01 NOTE — Addendum Note (Signed)
 Addended by: Thais Fill on: 08/01/2023 02:43 PM   Modules accepted: Orders

## 2023-08-04 ENCOUNTER — Telehealth: Payer: Self-pay

## 2023-08-04 ENCOUNTER — Other Ambulatory Visit (HOSPITAL_COMMUNITY): Payer: Self-pay

## 2023-08-04 NOTE — Telephone Encounter (Signed)
 Upon attempting to run test claim, I received this rejection message:   Per previous encounter, an approval letter was sent out by [employer] 05/29/23, however we have since learned that this was released in error. Called and spoke with representative of [employer] and verified that patient is NOT currently enrolled in program. She also confirmed for me that PAP approval letters are sent to insurance companies as well as the providers office, unfortunately it appears that they made no effort to "clean up their own mess" after learning of the mistake--which means that the insurance company is still fully under the impression that pt is enrolled into the PAP program.  Contacted pt's insurance company and, after an extensive amount of back-and-forth, was informed that they could not simply remove the PAP approval from the patient's account without speaking to the patient directly. Attempted to conference the pt in to the call, however it went straight to VM. Inquired if there was a direct number that I could provide to the pt so she could easily call back, however after an additional extensive hold time I was informed that the [employer]'s patient assistance team would be attempting to reach out to the pt to discuss.  Called pt back and left her a VM explaining the situation and requested that she be on the lookout for her insurance company to call. Left my direct callback number in case she had any further questions. Will await f/u.

## 2023-08-04 NOTE — Telephone Encounter (Signed)
 Pt returned call and we discussed in further detail. She verbalizes understanding to all. She also informs me that she has still not received LIS denial letter, despite being given a 7-14 day turn around time almost three weeks ago at this point.   Requested that she give me another call once she has spoken with her insurance and we can get delivery of her Rinvoq  scheduled.

## 2023-08-06 ENCOUNTER — Other Ambulatory Visit: Payer: Self-pay

## 2023-08-06 ENCOUNTER — Other Ambulatory Visit (HOSPITAL_BASED_OUTPATIENT_CLINIC_OR_DEPARTMENT_OTHER): Payer: Self-pay

## 2023-08-06 ENCOUNTER — Ambulatory Visit: Admitting: Family Medicine

## 2023-08-06 ENCOUNTER — Other Ambulatory Visit (HOSPITAL_COMMUNITY): Payer: Self-pay

## 2023-08-06 NOTE — Progress Notes (Signed)
 Specialty Pharmacy Initial Fill Coordination Note  [name] is a 63 y.o. female contacted today regarding initial fill of specialty medication(s) Upadacitinib  (Rinvoq )   Patient requested Delivery   Delivery date: 08/08/23   Verified address: [address]  [place] [address]   Medication will be filled on 08/07/23.   Patient is enrolled into grant and is aware of $0 copayment.

## 2023-08-07 ENCOUNTER — Other Ambulatory Visit: Payer: Self-pay

## 2023-08-08 ENCOUNTER — Encounter: Payer: Self-pay | Admitting: Dietician

## 2023-08-08 ENCOUNTER — Encounter: Payer: Medicare PPO | Attending: Surgery | Admitting: Dietician

## 2023-08-08 VITALS — Ht 61.0 in | Wt 308.2 lb

## 2023-08-08 DIAGNOSIS — Z6841 Body Mass Index (BMI) 40.0 and over, adult: Secondary | ICD-10-CM | POA: Diagnosis not present

## 2023-08-08 DIAGNOSIS — Z713 Dietary counseling and surveillance: Secondary | ICD-10-CM | POA: Diagnosis present

## 2023-08-08 DIAGNOSIS — E669 Obesity, unspecified: Secondary | ICD-10-CM

## 2023-08-08 NOTE — Progress Notes (Signed)
 Bariatric Nutrition Follow-Up Visit Medical Nutrition Therapy  Appt Start Time: 0809   End Time: 0840  Surgery date: 05/13/2023 Surgery type: Sleeve Gastrectomy  NUTRITION ASSESSMENT  Anthropometrics  Start weight at [facility]: 351.0 lbs (date: 09/24/2022)  Height: 61 in Weight today: 308.2 lb  Clinical  Medical hx: obesity; HTN Medications: folic acid , vit D, phentermine , lasix , Maxzide , rinvoq  Labs: B12 1,882; A1c 6.0; iron saturation 13;  Notable signs/symptoms: none noted Any previous deficiencies? No Allergies: penicillin, aleve  (naproxen ) Bowel Habits: Every day to every other day; takes Miralax  3 days per week  Body Composition Scale 05/27/2023 08/08/2023  Current Body Weight 337.3 308.2  Total Body Fat % 54.2 52.8  Visceral Fat 33 29  Fat-Free Mass % 45.7 47.1   Total Body Water  % 37.3 38.0  Muscle-Mass lbs 28.1 27.8  BMI 64.1 58.5  Body Fat Displacement           Torso  lbs 113.6 101.0         Left Leg  lbs 22.7 20.2         Right Leg  lbs 22.7 20.2         Left Arm  lbs 11.3 10.1         Right Arm  lbs 11.3 10.1   Lifestyle & Dietary Hx  Pt states it is hit and miss with food, stating she can't tolerate eggs and chicken anymore. Pt states after surgery she could but not now. Pt states she still has 2-3 protein shakes a day since she is not getting as much food. Pt states she is not throwing up anymore. Pt states she wants to walk, but her legs cramp up after a few minutes. Pt states she takes an arthritis pill and shot, stating it does not work well. Pt states her aunt passed, stating that is another reason she hasn't eaten much. Pt states her aunt didn't have any children and pt was like a child to her. Pt states she takes Miralax  3 days per week. Pt states her sleep as gotten better last little while (last few days).  Estimated daily fluid intake: 50-64 oz Estimated daily protein intake: 60+ g (with protein shakes Supplements: multivitamin and calcium  Current  average weekly physical activity: chair yoga daily for about 15-20 minutes (recorded tape)  24-Hr Dietary Recall First Meal: boiled egg white, protein shake Snack:   Second Meal: protein shake, half a banana Snack: nuts  Third Meal: baked fish and slaw and potato Snack: 2 sugar free popsicle  Beverages: un-sweet tea, water , jello (sugar free)  Post-Op Goals/ Signs/ Symptoms Using straws: yes Drinking while eating: no Chewing/swallowing difficulties: feels like food is getting stuck Changes in vision: no Changes to mood/headaches: no Hair loss/changes to skin/nails: no Difficulty focusing/concentrating: no Sweating: no Limb weakness: no Dizziness/lightheadedness: no Palpitations: no  Carbonated/caffeinated beverages: no N/V/D/C/Gas: Miralax  3 times per week Abdominal pain: no Dumping syndrome: no   NUTRITION DIAGNOSIS  Overweight/obesity ([place]-3.3) related to past poor dietary habits and physical inactivity as evidenced by completed bariatric surgery and following dietary guidelines for continued weight loss and healthy nutrition status.   NUTRITION INTERVENTION Nutrition counseling (C-1) and education (E-2) to facilitate bariatric surgery goals, including: Diet advancement to the standard prep The importance of consuming adequate calories as well as certain nutrients daily due to the body's need for essential vitamins, minerals, and fats The importance of daily physical activity and to reach a goal of at least 150 minutes of moderate to  vigorous physical activity weekly (or as directed by their physician) due to benefits such as increased musculature and improved lab values The importance of intuitive eating specifically learning hunger-satiety cues and understanding the importance of learning a new body: The importance of mindful eating to avoid grazing behaviors   Handouts Provided Include  Standard Prep Plan Advancement Guide  Learning Style & Readiness for Change Teaching  method utilized: Visual & Auditory  Demonstrated degree of understanding via: Teach Back  Readiness Level: ready Barriers to learning/adherence to lifestyle change: mobility/arthritis  RD's Notes for Next Visit Assess adherence to pt chosen goals  MONITORING & EVALUATION Dietary intake, weekly physical activity, body weight.  Next Steps Patient is to follow-up in 3 months for 6 month post-op follow-up/class.

## 2023-08-22 DIAGNOSIS — I10 Essential (primary) hypertension: Secondary | ICD-10-CM | POA: Diagnosis not present

## 2023-08-22 DIAGNOSIS — E559 Vitamin D deficiency, unspecified: Secondary | ICD-10-CM | POA: Diagnosis not present

## 2023-08-22 DIAGNOSIS — R7303 Prediabetes: Secondary | ICD-10-CM | POA: Diagnosis not present

## 2023-08-23 LAB — CMP14+EGFR
ALT: 14 IU/L (ref 0–32)
AST: 21 IU/L (ref 0–40)
Albumin: 3.8 g/dL (ref 3.8–4.9)
Alkaline Phosphatase: 100 IU/L (ref 44–121)
BUN/Creatinine Ratio: 18 (ref 12–28)
BUN: 18 mg/dL (ref 8–27)
Bilirubin Total: 0.8 mg/dL (ref 0.0–1.2)
CO2: 23 mmol/L (ref 20–29)
Calcium: 9.6 mg/dL (ref 8.7–10.3)
Chloride: 102 mmol/L (ref 96–106)
Creatinine, Ser: 0.98 mg/dL (ref 0.57–1.00)
Globulin, Total: 3.6 g/dL (ref 1.5–4.5)
Glucose: 94 mg/dL (ref 70–99)
Potassium: 4 mmol/L (ref 3.5–5.2)
Sodium: 140 mmol/L (ref 134–144)
Total Protein: 7.4 g/dL (ref 6.0–8.5)
eGFR: 66 mL/min/{1.73_m2} (ref >59)

## 2023-08-23 LAB — HEMOGLOBIN A1C
Est. average glucose Bld gHb Est-mCnc: 134 mg/dL
Hgb A1c MFr Bld: 6.3 % — ABNORMAL HIGH (ref 4.8–5.6)

## 2023-08-23 LAB — LIPID PANEL
Chol/HDL Ratio: 2.8 ratio (ref 0.0–4.4)
Cholesterol, Total: 156 mg/dL (ref 100–199)
HDL: 56 mg/dL (ref >39)
LDL Chol Calc (NIH): 87 mg/dL (ref 0–99)
Triglycerides: 64 mg/dL (ref 0–149)
VLDL Cholesterol Cal: 13 mg/dL (ref 5–40)

## 2023-08-23 LAB — TSH+FREE T4
Free T4: 0.99 ng/dL (ref 0.82–1.77)
TSH: 2.5 u[IU]/mL (ref 0.450–4.500)

## 2023-08-23 LAB — VITAMIN D 25 HYDROXY (VIT D DEFICIENCY, FRACTURES): Vit D, 25-Hydroxy: 62.2 ng/mL (ref 30.0–100.0)

## 2023-08-26 ENCOUNTER — Ambulatory Visit (INDEPENDENT_AMBULATORY_CARE_PROVIDER_SITE_OTHER): Admitting: Family Medicine

## 2023-08-26 ENCOUNTER — Encounter: Payer: Self-pay | Admitting: Family Medicine

## 2023-08-26 VITALS — BP 120/82 | HR 61 | Resp 18 | Ht 61.0 in | Wt 307.0 lb

## 2023-08-26 DIAGNOSIS — I10 Essential (primary) hypertension: Secondary | ICD-10-CM

## 2023-08-26 DIAGNOSIS — M0600 Rheumatoid arthritis without rheumatoid factor, unspecified site: Secondary | ICD-10-CM | POA: Diagnosis not present

## 2023-08-26 NOTE — Patient Instructions (Addendum)
 F/U in 12 weeks, call if you need me sooner    Annual exam Dec 13 or after   CONGRATS, nio med changes    It is important that you exercise regularly at least 30 minutes 5 times a week. If you develop chest pain, have severe difficulty breathing, or feel very tired, stop exercising immediately and seek medical attention   Think about what you will eat, plan ahead. Choose " clean, green, fresh or frozen" over canned, processed or packaged foods which are more sugary, salty and fatty. 70 to 75% of food eaten should be vegetables and fruit. Three meals at set times with snacks allowed between meals, but they must be fruit or vegetables. Aim to eat over a 12 hour period , example 7 am to 7 pm, and STOP after  your last meal of the day. Drink water ,generally about 64 ounces per day, no other drink is as healthy. Fruit juice is best enjoyed in a healthy way, by EATING the fruit. Thanks for choosing [facility], we consider it a privelige to serve you.

## 2023-08-27 ENCOUNTER — Other Ambulatory Visit: Payer: Self-pay

## 2023-08-27 ENCOUNTER — Other Ambulatory Visit (HOSPITAL_COMMUNITY): Payer: Self-pay

## 2023-08-27 ENCOUNTER — Other Ambulatory Visit: Payer: Self-pay | Admitting: Rheumatology

## 2023-08-27 DIAGNOSIS — M0609 Rheumatoid arthritis without rheumatoid factor, multiple sites: Secondary | ICD-10-CM

## 2023-08-27 MED ORDER — RINVOQ 15 MG PO TB24
15.0000 mg | ORAL_TABLET | Freq: Every day | ORAL | 0 refills | Status: DC
  Filled 2023-08-27 – 2023-09-18 (×4): qty 30, 30d supply, fill #0

## 2023-08-27 NOTE — Telephone Encounter (Signed)
 Last Fill: 08/01/2023  Labs: 08/22/2023 CMP WNL  05/14/2023 CBC  WBC 12.7 Hemoglobin 11.4 MCV 78.7 MCH 23.2 MCHC 29.5 Neutro 10.0 Abs Immature Granulocytes 0.08  Tried to contact patient but no voicemail set up to leave a voicemail   TB Gold: 03/14/2023 TB Gold Negative    Next Visit: 11/27/2023  Last Visit: 06/09/2023  DX: Rheumatoid arthritis of multiple sites with negative rheumatoid factor   Current Dose per office note 06/09/2023: Rinvoq  15 mg 1 tablet by mouth daily   Okay to refill Rinvoq ?

## 2023-08-29 ENCOUNTER — Other Ambulatory Visit: Payer: Self-pay

## 2023-09-01 ENCOUNTER — Other Ambulatory Visit: Payer: Self-pay

## 2023-09-01 ENCOUNTER — Encounter: Payer: Self-pay | Admitting: Family Medicine

## 2023-09-01 ENCOUNTER — Ambulatory Visit: Payer: Self-pay | Admitting: Family Medicine

## 2023-09-01 ENCOUNTER — Other Ambulatory Visit: Payer: Self-pay | Admitting: Rheumatology

## 2023-09-01 DIAGNOSIS — M0609 Rheumatoid arthritis without rheumatoid factor, multiple sites: Secondary | ICD-10-CM

## 2023-09-01 NOTE — Assessment & Plan Note (Signed)
  Patient re-educated about  the importance of commitment to a  minimum of 150 minutes of exercise per week as able.  The importance of healthy food choices with portion control discussed, as well as eating regularly and within a 12 hour window most days. The need to choose clean , green food 50 to 75% of the time is discussed, as well as to make water  the primary drink and set a goal of 64 ounces water  daily.       08/26/2023    9:54 AM 08/08/2023    8:56 AM 06/09/2023   12:33 PM  Weight /BMI  Weight 307 lb 0.6 oz 308 lb 3.2 oz 329 lb  Height 5' 1 (1.549 m) 5' 1 (1.549 m) 5' 1.5 (1.562 m)  BMI 58.01 kg/m2 58.23 kg/m2 61.16 kg/m2    Excellent weight loss post surgery

## 2023-09-01 NOTE — Assessment & Plan Note (Signed)
 Onmethotrexate wih improved symptom control

## 2023-09-01 NOTE — Telephone Encounter (Signed)
 Last Fill: 01/09/2023   Labs: 08/22/2023  CMP WNL  05/14/2023 CBC  WBC 12.7  Hemoglobin 11.4 MCV 78.7  MCH 23.2 MCHC 29.5  Neutro 10.0   Abs Immature Granulocytes 0.08   Tried to contact patient but no voicemail set up to leave a voicemail    Next Visit: 11/27/2023   Last Visit: 06/09/2023   DX: Rheumatoid arthritis of multiple sites with negative rheumatoid factor    Current Dose per office note 06/09/2023: MTX 0.8 ml sq injections once weekly    Okay to refill Rinvoq ?

## 2023-09-01 NOTE — Assessment & Plan Note (Signed)
 Controlled, no change in medication DASH diet and commitment to daily physical activity for a minimum of 30 minutes discussed and encouraged, as a part of hypertension management. The importance of attaining a healthy weight is also discussed.     08/26/2023   10:13 AM 08/26/2023    9:54 AM 08/08/2023    8:56 AM 06/09/2023   12:33 PM 06/03/2023    4:30 PM 06/03/2023    3:46 PM 05/27/2023    4:20 PM  BP/Weight  Systolic BP 120 125  111 136 140   Diastolic BP 82 76  77 82 82   Wt. (Lbs)  307.04 308.2 329  332.12 337.3  BMI  58.01 kg/m2 58.23 kg/m2 61.16 kg/m2  62.75 kg/m2 63.73 kg/m2

## 2023-09-01 NOTE — Progress Notes (Signed)
 [name]     MRN: [id number]      DOB: 09/28/1960  Chief Complaint  Patient presents with   Hypertension    2 month follow up     HPI [name] is here for follow up and re-evaluation of chronic medical conditions, medication management and review of any available recent lab and radiology data.  Preventive health is updated, specifically  Cancer screening and Immunization.   Doing extremely well post bariatric surgery, no complications with excellent weight loss The PT denies any adverse reactions to current medications since the last visit.  There are no new concerns.  There are no specific complaints   ROS Denies recent fever or chills. Denies sinus pressure, nasal congestion, ear pain or sore throat. Denies chest congestion, productive cough or wheezing. Denies chest pains, palpitations and leg swelling Denies abdominal pain, nausea, vomiting,diarrhea or constipation.   Denies dysuria, frequency, hesitancy or incontinence. Denies joint pain, swelling and limitation in mobility. Denies headaches, seizures, numbness, or tingling. Denies depression, anxiety or insomnia. Denies skin break down or rash.   PE  BP 120/82   Pulse 61   Resp 18   Ht 5' 1 (1.549 m)   Wt (!) 307 lb 0.6 oz (139.3 kg)   SpO2 97%   BMI 58.01 kg/m   Patient alert and oriented and in no cardiopulmonary distress.  HEENT: No facial asymmetry, EOMI,     Neck supple .  Chest: Clear to auscultation bilaterally.  CVS: S1, S2 no murmurs, no S3.Regular rate.  ABD: Soft non tender.   Ext: No edema  MS: Adequate ROM spine, shoulders, hips and knees.  Skin: Intact, no ulcerations or rash noted.  Psych: Good eye contact, normal affect. Memory intact not anxious or depressed appearing.  CNS: CN 2-12 intact, power,  normal throughout.no focal deficits noted.   Assessment & Plan  Essential hypertension Controlled, no change in medication DASH diet and commitment to daily  physical activity for a minimum of 30 minutes discussed and encouraged, as a part of hypertension management. The importance of attaining a healthy weight is also discussed.     08/26/2023   10:13 AM 08/26/2023    9:54 AM 08/08/2023    8:56 AM 06/09/2023   12:33 PM 06/03/2023    4:30 PM 06/03/2023    3:46 PM 05/27/2023    4:20 PM  BP/Weight  Systolic BP 120 125  111 136 140   Diastolic BP 82 76  77 82 82   Wt. (Lbs)  307.04 308.2 329  332.12 337.3  BMI  58.01 kg/m2 58.23 kg/m2 61.16 kg/m2  62.75 kg/m2 63.73 kg/m2       Morbid obesity (HCC)  Patient re-educated about  the importance of commitment to a  minimum of 150 minutes of exercise per week as able.  The importance of healthy food choices with portion control discussed, as well as eating regularly and within a 12 hour window most days. The need to choose clean , green food 50 to 75% of the time is discussed, as well as to make water  the primary drink and set a goal of 64 ounces water  daily.       08/26/2023    9:54 AM 08/08/2023    8:56 AM 06/09/2023   12:33 PM  Weight /BMI  Weight 307 lb 0.6 oz 308 lb 3.2 oz 329 lb  Height 5' 1 (1.549 m) 5' 1 (1.549 m) 5' 1.5 (1.562 m)  BMI 58.01 kg/m2  58.23 kg/m2 61.16 kg/m2    Excellent weight loss post surgery  Rheumatoid arthritis with negative rheumatoid factor (HCC) Onmethotrexate wih improved symptom control

## 2023-09-02 ENCOUNTER — Other Ambulatory Visit: Payer: Self-pay

## 2023-09-17 ENCOUNTER — Other Ambulatory Visit: Payer: Self-pay | Admitting: Family Medicine

## 2023-09-18 ENCOUNTER — Other Ambulatory Visit: Payer: Self-pay

## 2023-09-18 ENCOUNTER — Other Ambulatory Visit: Payer: Self-pay | Admitting: Pharmacy Technician

## 2023-09-18 ENCOUNTER — Other Ambulatory Visit (HOSPITAL_COMMUNITY): Payer: Self-pay

## 2023-09-18 NOTE — Progress Notes (Signed)
 Specialty Pharmacy Refill Coordination Note  Braidyn RADHIKA DERSHEM is a 63 y.o. female contacted today regarding refills of specialty medication(s) Upadacitinib  (Rinvoq )   Patient requested Delivery   Delivery date: 09/22/23   Verified address: [address] [place] [address]   Medication will be filled on 09/18/23.

## 2023-10-03 NOTE — Progress Notes (Signed)
 Patient on Rinvoq  for RA. Previously through patient assistance now enrolled into grant  Continue Rinvoq  15mg  po once daily in combination with MTX 20mg  subcut once weekly  Sherry Pennant, PharmD, MPH, BCPS, CPP Clinical Pharmacist (Rheumatology and Pulmonology)

## 2023-10-15 ENCOUNTER — Other Ambulatory Visit (HOSPITAL_COMMUNITY): Payer: Self-pay

## 2023-10-15 ENCOUNTER — Other Ambulatory Visit: Payer: Self-pay

## 2023-10-15 ENCOUNTER — Other Ambulatory Visit: Payer: Self-pay | Admitting: Rheumatology

## 2023-10-15 DIAGNOSIS — M0609 Rheumatoid arthritis without rheumatoid factor, multiple sites: Secondary | ICD-10-CM

## 2023-10-15 MED ORDER — RINVOQ 15 MG PO TB24
15.0000 mg | ORAL_TABLET | Freq: Every day | ORAL | 0 refills | Status: DC
  Filled 2023-10-15 – 2023-10-21 (×3): qty 30, 30d supply, fill #0

## 2023-10-15 NOTE — Telephone Encounter (Signed)
 Last Fill: 08/27/2023  Labs: 08/22/2023 CMP WNL   Left voicemail to advise the Patient needs some labs with our lab hours as well.   TB Gold: 09/17/2023 Negative    Next Visit: 11/27/2023  Last Visit: 3./24/2025  DX: Rheumatoid arthritis of multiple sites with negative rheumatoid factor (HCC)   Current Dose per office note 3:/24/2025  Rinvoq  15 mg 1 tablet by mouth daily.  Okay to refill Rinvoq ?

## 2023-10-16 ENCOUNTER — Other Ambulatory Visit (HOSPITAL_COMMUNITY): Payer: Self-pay

## 2023-10-17 ENCOUNTER — Other Ambulatory Visit: Payer: Self-pay

## 2023-10-17 DIAGNOSIS — M0609 Rheumatoid arthritis without rheumatoid factor, multiple sites: Secondary | ICD-10-CM

## 2023-10-17 DIAGNOSIS — Z79899 Other long term (current) drug therapy: Secondary | ICD-10-CM

## 2023-10-17 DIAGNOSIS — Z13220 Encounter for screening for lipoid disorders: Secondary | ICD-10-CM | POA: Diagnosis not present

## 2023-10-18 LAB — CBC WITH DIFFERENTIAL/PLATELET
Absolute Lymphocytes: 2856 {cells}/uL (ref 850–3900)
Absolute Monocytes: 511 {cells}/uL (ref 200–950)
Basophils Absolute: 37 {cells}/uL (ref 0–200)
Basophils Relative: 0.5 %
Eosinophils Absolute: 111 {cells}/uL (ref 15–500)
Eosinophils Relative: 1.5 %
HCT: 35.9 % (ref 35.0–45.0)
Hemoglobin: 11 g/dL — ABNORMAL LOW (ref 11.7–15.5)
MCH: 25.5 pg — ABNORMAL LOW (ref 27.0–33.0)
MCHC: 30.6 g/dL — ABNORMAL LOW (ref 32.0–36.0)
MCV: 83.1 fL (ref 80.0–100.0)
MPV: 11 fL (ref 7.5–12.5)
Monocytes Relative: 6.9 %
Neutro Abs: 3885 {cells}/uL (ref 1500–7800)
Neutrophils Relative %: 52.5 %
Platelets: 369 Thousand/uL (ref 140–400)
RBC: 4.32 Million/uL (ref 3.80–5.10)
RDW: 13.4 % (ref 11.0–15.0)
Total Lymphocyte: 38.6 %
WBC: 7.4 Thousand/uL (ref 3.8–10.8)

## 2023-10-18 LAB — COMPLETE METABOLIC PANEL WITHOUT GFR
AG Ratio: 1.3 (calc) (ref 1.0–2.5)
ALT: 10 U/L (ref 6–29)
AST: 16 U/L (ref 10–35)
Albumin: 4 g/dL (ref 3.6–5.1)
Alkaline phosphatase (APISO): 72 U/L (ref 37–153)
BUN: 22 mg/dL (ref 7–25)
CO2: 29 mmol/L (ref 20–32)
Calcium: 9.3 mg/dL (ref 8.6–10.4)
Chloride: 103 mmol/L (ref 98–110)
Creat: 0.93 mg/dL (ref 0.50–1.05)
Globulin: 3.2 g/dL (ref 1.9–3.7)
Glucose, Bld: 84 mg/dL (ref 65–99)
Potassium: 4.2 mmol/L (ref 3.5–5.3)
Sodium: 139 mmol/L (ref 135–146)
Total Bilirubin: 0.6 mg/dL (ref 0.2–1.2)
Total Protein: 7.2 g/dL (ref 6.1–8.1)

## 2023-10-18 LAB — LIPID PANEL
Cholesterol: 155 mg/dL (ref <200)
HDL: 51 mg/dL (ref >=50)
LDL Cholesterol (Calc): 87 mg/dL
Non-HDL Cholesterol (Calc): 104 mg/dL (ref <130)
Total CHOL/HDL Ratio: 3 (calc) (ref <5.0)
Triglycerides: 81 mg/dL (ref <150)

## 2023-10-20 ENCOUNTER — Ambulatory Visit: Payer: Self-pay | Admitting: Physician Assistant

## 2023-10-21 ENCOUNTER — Other Ambulatory Visit (HOSPITAL_COMMUNITY): Payer: Self-pay

## 2023-10-21 ENCOUNTER — Ambulatory Visit: Payer: Self-pay | Admitting: Rheumatology

## 2023-10-21 ENCOUNTER — Other Ambulatory Visit: Payer: Self-pay

## 2023-10-21 NOTE — Progress Notes (Signed)
 Specialty Pharmacy Refill Coordination Note  [name] is a 63 y.o. female contacted today regarding refills of specialty medication(s) Upadacitinib  (Rinvoq )   Patient requested Delivery   Delivery date: 10/23/23   Verified address: [address] [place] [address]   Medication will be filled on 08.06.25.

## 2023-10-21 NOTE — Progress Notes (Signed)
Hemoglobin is low and stable.  CMP is normal.  Please forward results to her PCP.

## 2023-10-22 ENCOUNTER — Ambulatory Visit: Payer: Self-pay

## 2023-10-22 ENCOUNTER — Emergency Department (HOSPITAL_COMMUNITY)
Admission: EM | Admit: 2023-10-22 | Discharge: 2023-10-22 | Disposition: A | Discharge status: Home / Self Care | Source: Ambulatory Visit | Attending: Emergency Medicine | Admitting: Emergency Medicine

## 2023-10-22 ENCOUNTER — Other Ambulatory Visit: Payer: Self-pay

## 2023-10-22 ENCOUNTER — Encounter (HOSPITAL_COMMUNITY): Payer: Self-pay | Admitting: *Deleted

## 2023-10-22 DIAGNOSIS — Z91040 Latex allergy status: Secondary | ICD-10-CM | POA: Diagnosis not present

## 2023-10-22 DIAGNOSIS — R519 Headache, unspecified: Secondary | ICD-10-CM | POA: Diagnosis present

## 2023-10-22 DIAGNOSIS — G43809 Other migraine, not intractable, without status migrainosus: Secondary | ICD-10-CM | POA: Insufficient documentation

## 2023-10-22 LAB — CBC WITH DIFFERENTIAL/PLATELET
Abs Immature Granulocytes: 0.03 K/uL (ref 0.00–0.07)
Basophils Absolute: 0 K/uL (ref 0.0–0.1)
Basophils Relative: 1 %
Eosinophils Absolute: 0.1 K/uL (ref 0.0–0.5)
Eosinophils Relative: 1 %
HCT: 36.4 % (ref 36.0–46.0)
Hemoglobin: 11.2 g/dL — ABNORMAL LOW (ref 12.0–15.0)
Immature Granulocytes: 0 %
Lymphocytes Relative: 32 %
Lymphs Abs: 2.4 K/uL (ref 0.7–4.0)
MCH: 25.9 pg — ABNORMAL LOW (ref 26.0–34.0)
MCHC: 30.8 g/dL (ref 30.0–36.0)
MCV: 84.3 fL (ref 80.0–100.0)
Monocytes Absolute: 0.6 K/uL (ref 0.1–1.0)
Monocytes Relative: 8 %
Neutro Abs: 4.4 K/uL (ref 1.7–7.7)
Neutrophils Relative %: 58 %
Platelets: 353 K/uL (ref 150–400)
RBC: 4.32 MIL/uL (ref 3.87–5.11)
RDW: 14.5 % (ref 11.5–15.5)
WBC: 7.6 K/uL (ref 4.0–10.5)
nRBC: 0 % (ref 0.0–0.2)

## 2023-10-22 LAB — BASIC METABOLIC PANEL WITH GFR
Anion gap: 10 (ref 5–15)
BUN: 23 mg/dL — ABNORMAL HIGH (ref 6–20)
CO2: 25 mmol/L (ref 22–32)
Calcium: 9.2 mg/dL (ref 8.9–10.3)
Chloride: 101 mmol/L (ref 98–111)
Creatinine, Ser: 0.95 mg/dL (ref 0.44–1.00)
GFR, Estimated: >60 mL/min (ref >60)
Glucose, Bld: 97 mg/dL (ref 70–99)
Potassium: 3.9 mmol/L (ref 3.5–5.1)
Sodium: 136 mmol/L (ref 135–145)

## 2023-10-22 MED ORDER — METOCLOPRAMIDE HCL 10 MG PO TABS: 10.0000 mg | ORAL_TABLET | Freq: Four times a day (QID) | ORAL | 0 refills | Status: DC

## 2023-10-22 MED ORDER — DIPHENHYDRAMINE HCL 50 MG/ML IJ SOLN
12.5000 mg | Freq: Once | INTRAMUSCULAR | Status: AC
  Administered 2023-10-22: 12.5 mg via INTRAVENOUS
  Filled 2023-10-22: qty 1

## 2023-10-22 MED ORDER — METOCLOPRAMIDE HCL 5 MG/ML IJ SOLN
10.0000 mg | Freq: Once | INTRAMUSCULAR | Status: AC
  Administered 2023-10-22: 10 mg via INTRAVENOUS
  Filled 2023-10-22: qty 2

## 2023-10-22 MED ORDER — DEXAMETHASONE SODIUM PHOSPHATE 10 MG/ML IJ SOLN
10.0000 mg | Freq: Once | INTRAMUSCULAR | Status: AC
  Administered 2023-10-22: 10 mg via INTRAVENOUS
  Filled 2023-10-22: qty 1

## 2023-10-22 NOTE — Telephone Encounter (Signed)
 FYI Only or Action Required?: FYI only for provider.  Patient was last seen in primary care on 08/26/2023 by Vickie Rollene BRAVO, MD.  Called Nurse Triage reporting No chief complaint on file..  Symptoms began today.  Interventions attempted: Nothing.  Symptoms are: gradually worsening.  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 Denies being seen medical provider when they occur, states she gets a severe headache with mouth drooping. States it was not called Bells Palsy when seen 20 years ago. Today she reports 10/10 headache. Patient agrees to go to the ED via another driver. 911 precautions reviewed, pt verbalized understanding. Advised to bring medications or a list of medications and to remain NPO.   1. LOCATION: Where does it hurt?      Severe headache  2. ONSET: When did the headache start? (e.g., minutes, hours, days)      Today  3. SEVERITY: How bad is the pain? and What does it keep you from doing?  (e.g., Scale 1-10; mild, moderate, or severe)     10/10  4. RECURRENT SYMPTOM: Have you ever had headaches before? If Yes, ask: When was the last time? and What happened that time?      Yes, states hx of mini strokes that have been occurring 20+ years, has not been seen since then for the occurrences  Protocols used: [facility] Copied from CRM [id number]. Topic: Clinical - Red Word Triage >> Oct 22, 2023 10:11 AM [name] wrote: Red Word that prompted transfer to Nurse Triage: The patient states she had a mini stroke two weeks ago that she has not been treated for and the last two days she has had a severe headache.

## 2023-10-22 NOTE — ED Provider Notes (Signed)
 [place] EMERGENCY DEPARTMENT AT [facility] Provider Note   CSN: [id number] Arrival date & time: 10/22/23  1128     Patient presents with: Headache   [name] is a 63 y.o. female.  {Add pertinent medical, surgical, social history, OB history to HPI:3966} 63 year old female history of migraines who presents emergency department headache.  Patient reports that the past 2 weeks she has been having intermittent headaches.  Says that she had gradual onset of headache yesterday.  Right sided.  Has difficulty characterizing.  Currently 10/10 in severity.  Feel similar to prior migraines.  Tried Tylenol  but did not improve her symptoms.  Called her primary doctor who referred her into the emergency department.       Prior to Admission medications   Medication Sig Start Date End Date Taking? Authorizing Provider  B-D TB SYRINGE 1CC/27GX1/2 27G X 1/2 1 ML MISC USE 1 SYRINGE ONCE WEEKLY TO INJECT METHOTREXATE  05/20/23   Cheryl Waddell HERO, PA-C  Calcium  Carb-Cholecalciferol (CALCIUM  + VITAMIN D3 PO) Take by mouth.    [provider]  folic acid  (FOLVITE ) 1 MG tablet TAKE 2 TABLETS BY MOUTH EVERY DAY Patient taking differently: Take 1 mg by mouth daily. 08/29/22   Cheryl Waddell HERO, PA-C  methotrexate  50 MG/2ML injection INJECT 0.8 MLS (20 MG TOTAL) INTO THE SKIN ONCE A WEEK. 09/01/23   Cheryl Waddell HERO, PA-C  Multiple Vitamin (MULTIVITAMIN) capsule Take 1 capsule by mouth daily.    [provider]  NEEDLE, DISP, 25 G (BD SAFETYGLIDE NEEDLE) 25G X 5/8 MISC ud 05/15/23     pantoprazole  (PROTONIX ) 40 MG tablet Take 1 tablet (40 mg total) by mouth daily. 05/14/23   Stechschulte, Deward PARAS, MD  triamterene -hydrochlorothiazide  (MAXZIDE -25) 37.5-25 MG tablet TAKE 1/2 TABLET BY MOUTH ONCE DAILY 09/17/23   Antonetta Rollene BRAVO, MD  Upadacitinib  ER (RINVOQ ) 15 MG TB24 Take 1 tablet (15 mg total) by mouth daily. 10/15/23   Cheryl Waddell HERO, PA-C    Allergies: Aleve  [naproxen   sodium], Penicillins, Adhesive [tape], and Latex    Review of Systems  Updated Vital Signs BP 137/77 (BP Location: Right Arm)   Pulse (!) 55   Temp 98.2 F (36.8 C) (Oral)   Resp 17   Ht 5' 1 (1.549 m)   Wt 134.3 kg   SpO2 100%   BMI 55.93 kg/m   Physical Exam Vitals and nursing note reviewed.  Constitutional:      General: She is not in acute distress.    Appearance: She is well-developed.  HENT:     Head: Normocephalic and atraumatic.     Right Ear: External ear normal.     Left Ear: External ear normal.     Nose: Nose normal.  Eyes:     Extraocular Movements: Extraocular movements intact.     Conjunctiva/sclera: Conjunctivae normal.     Pupils: Pupils are equal, round, and reactive to light.  Neck:     Comments: No meningismus Musculoskeletal:     Cervical back: Normal range of motion and neck supple.     Right lower leg: No edema.     Left lower leg: No edema.  Skin:    General: Skin is warm and dry.  Neurological:     Mental Status: She is alert and oriented to person, place, and time. Mental status is at baseline.     Comments: MENTAL STATUS: AAOx3 CRANIAL NERVES: II: Pupils equal and reactive 4 mm BL, no RAPD, no VF  deficits III, IV, VI: EOM intact, no gaze preference or deviation, no nystagmus. V: normal sensation to light touch in V1, V2, and V3 segments bilaterally VII: no facial weakness or asymmetry, no nasolabial fold flattening VIII: normal hearing to speech and finger friction IX, X: normal palatal elevation, no uvular deviation XI: 5/5 head turn and 5/5 shoulder shrug bilaterally XII: midline tongue protrusion MOTOR: 5/5 strength in R shoulder flexion, elbow flexion and extension, and grip strength. 5/5 strength in L shoulder flexion, elbow flexion and extension, and grip strength.  5/5 strength in R hip and knee flexion, knee extension, ankle plantar and dorsiflexion. 5/5 strength in L hip and knee flexion, knee extension, ankle plantar and  dorsiflexion. SENSORY: Normal sensation to light touch in all extremities COORD: Normal finger to nose and heel to shin, no tremor, no dysmetria  Psychiatric:        Mood and Affect: Mood normal.     (all labs ordered are listed, but only abnormal results are displayed) Labs Reviewed  CBC WITH DIFFERENTIAL/PLATELET  BASIC METABOLIC PANEL WITH GFR    EKG: None  Radiology: No results found.  {Document cardiac monitor, telemetry assessment procedure when appropriate:32947} Procedures   Medications Ordered in the ED  metoCLOPramide  (REGLAN ) injection 10 mg (10 mg Intravenous Given 10/22/23 1326)  diphenhydrAMINE  (BENADRYL ) injection 12.5 mg (12.5 mg Intravenous Given 10/22/23 1320)      {Click here for ABCD2, HEART and other calculators REFRESH Note before signing:1}                              Medical Decision Making Amount and/or Complexity of Data Reviewed Labs: ordered.  Risk Prescription drug management.   ***  {Document critical care time when appropriate  Document review of labs and clinical decision tools ie CHADS2VASC2, etc  Document your independent review of radiology images and any outside records  Document your discussion with family members, caretakers and with consultants  Document social determinants of health affecting pt's care  Document your decision making why or why not admission, treatments were needed:32947:::1}   Final diagnoses:  None    ED Discharge Orders     None

## 2023-10-22 NOTE — Discharge Instructions (Addendum)
 You were seen for your headache in the emergency department.  You were given medicines which improved your symptoms.  At home, please take Tylenol  and ibuprofen  for your headache.  You may also take the Reglan  we have prescribed you for your headache or any nausea or vomiting that you have.  You may take this with Benadryl  for severe headaches but please note that this will make you drowsy.    Follow-up with your primary doctor in 2-3 days regarding your visit.    Return immediately to the emergency department if you experience any of the following: Vision changes, numbness or weakness of your arms or legs, or any other concerning symptoms.    Thank you for visiting our Emergency Department. It was a pleasure taking care of you today.

## 2023-10-22 NOTE — ED Triage Notes (Signed)
 Pt states HA off and on x 2 weeks. + N/V and light sensitivity

## 2023-10-23 ENCOUNTER — Telehealth: Payer: Self-pay

## 2023-10-23 NOTE — Telephone Encounter (Signed)
 Copied from CRM #[id number]. Topic: Appointments - Scheduling Inquiry for Clinic >> Oct 23, 2023  1:40 PM Sasha H wrote: Reason for CRM: Pt was seen in ER yesterday and was told to follow up within 3 days, there are no opening within 3 days, please reach out to pt

## 2023-10-24 ENCOUNTER — Ambulatory Visit (INDEPENDENT_AMBULATORY_CARE_PROVIDER_SITE_OTHER): Admitting: Family Medicine

## 2023-10-24 ENCOUNTER — Encounter: Payer: Self-pay | Admitting: Family Medicine

## 2023-10-24 VITALS — BP 119/80 | HR 66 | Resp 16 | Ht 61.0 in | Wt 291.0 lb

## 2023-10-24 DIAGNOSIS — I10 Essential (primary) hypertension: Secondary | ICD-10-CM

## 2023-10-24 DIAGNOSIS — Z09 Encounter for follow-up examination after completed treatment for conditions other than malignant neoplasm: Secondary | ICD-10-CM

## 2023-10-24 DIAGNOSIS — M0609 Rheumatoid arthritis without rheumatoid factor, multiple sites: Secondary | ICD-10-CM

## 2023-10-24 DIAGNOSIS — G43101 Migraine with aura, not intractable, with status migrainosus: Secondary | ICD-10-CM | POA: Diagnosis not present

## 2023-10-24 MED ORDER — FOLIC ACID 1 MG PO TABS: 2.0000 mg | ORAL_TABLET | Freq: Every day | ORAL | 3 refills | Status: AC

## 2023-10-24 MED ORDER — TRIAMTERENE-HCTZ 37.5-25 MG PO TABS: 0.5000 | ORAL_TABLET | Freq: Every day | ORAL | 3 refills | Status: AC

## 2023-10-24 MED ORDER — SUMATRIPTAN SUCCINATE 25 MG PO TABS: 25.0000 mg | ORAL_TABLET | ORAL | 0 refills | Status: AC | PRN

## 2023-10-24 NOTE — Patient Instructions (Signed)
 F/U as before  Wean off of reglan  in the next 2 days, if able,  take one today and none tomorrow unless headache recurs  Immitrex is sent in the event you have a migraine headache again  Please stay on blood pressure medication it is excellent  Congrats on ongoing safe weight loss, eat regularly small amounts of healthy food choices  ,It is important that you exercise regularly at least 30 minutes 5 times a week. If you develop chest pain, have severe difficulty breathing, or feel very tired, stop exercising immediately and seek medical attention  Thanks for choosing [facility], we consider it a privelige to serve you.

## 2023-10-26 ENCOUNTER — Encounter: Payer: Self-pay | Admitting: Family Medicine

## 2023-10-26 DIAGNOSIS — Z09 Encounter for follow-up examination after completed treatment for conditions other than malignant neoplasm: Secondary | ICD-10-CM | POA: Insufficient documentation

## 2023-10-26 NOTE — Assessment & Plan Note (Signed)
 Controlled, no change in medication DASH diet and commitment to daily physical activity for a minimum of 30 minutes discussed and encouraged, as a part of hypertension management. The importance of attaining a healthy weight is also discussed.     10/24/2023   10:43 AM 10/22/2023    2:41 PM 10/22/2023   11:38 AM 08/26/2023   10:13 AM 08/26/2023    9:54 AM 08/08/2023    8:56 AM 06/09/2023   12:33 PM  BP/Weight  Systolic BP 119 135 137 120 125  111  Diastolic BP 80 65 77 82 76  77  Wt. (Lbs) 291.04  296  307.04 308.2 329  BMI 54.99 kg/m2  55.93 kg/m2  58.01 kg/m2 58.23 kg/m2 61.16 kg/m2

## 2023-10-26 NOTE — Assessment & Plan Note (Signed)
 Improved, wean off of reglan  , immitrex prescribed for acute recurrennceof headache

## 2023-10-26 NOTE — Progress Notes (Signed)
   [name]     MRN: [id number]      DOB: 07-Mar-1961  Chief Complaint  Patient presents with   Follow-up    Seen in ED 08/06 for migraine     HPI [name] is here for follow up from recent ED visit on 8/6 for migraine headache Reports 0ver 70 % improvement, still  taking reglan  3 times per day No new focal deficits noted Denies fever , chills, sins pressure or drainage ROS Denies recent fever or chills. Denies sinus pressure, nasal congestion, ear pain or sore throat. Denies chest congestion, productive cough or wheezing. Denies chest pains, palpitations and leg swelling Denies abdominal pain, nausea, vomiting,diarrhea or constipation.    PE  BP 119/80   Pulse 66   Resp 16   Ht 5' 1 (1.549 m)   Wt 291 lb 0.6 oz (132 kg)   SpO2 98%   BMI 54.99 kg/m   Patient alert and oriented and in no cardiopulmonary distress.  HEENT: No facial asymmetry, EOMI,     Neck supple .  Chest: Clear to auscultation bilaterally.  CVS: S1, S2 no murmurs, no S3.Regular rate.     Ext: No edema  MS: Adequate ROM spine, shoulders, hips and knees.  Skin: Intact, no ulcerations or rash noted.  Psych: Good eye contact, normal affect. Memory intact not anxious or depressed appearing.  CNS: CN 2-12 intact, power,  normal throughout.no focal deficits noted.   Assessment & Plan  Migraine headache Improved, wean off of reglan  , immitrex prescribed for acute recurrennceof headache  Encounter for examination following treatment at hospital Patient in for follow up of recent hospitalization. Discharge summary, and laboratory and radiology data are reviewed, and any questions or concerns  are discussed. Specific issues requiring follow up are specifically addressed.   Morbid obesity (HCC)  Patient re-educated about  the importance of commitment to a  minimum of 150 minutes of exercise per week as able.  The importance of healthy food choices with portion control discussed,  as well as eating regularly and within a 12 hour window most days. The need to choose clean , green food 50 to 75% of the time is discussed, as well as to make water  the primary drink and set a goal of 64 ounces water  daily.       10/24/2023   10:43 AM 10/22/2023   11:38 AM 08/26/2023    9:54 AM  Weight /BMI  Weight 291 lb 0.6 oz 296 lb 307 lb 0.6 oz  Height 5' 1 (1.549 m) 5' 1 (1.549 m) 5' 1 (1.549 m)  BMI 54.99 kg/m2 55.93 kg/m2 58.01 kg/m2    Ongoing weigh loss at healthy rate  Essential hypertension Controlled, no change in medication DASH diet and commitment to daily physical activity for a minimum of 30 minutes discussed and encouraged, as a part of hypertension management. The importance of attaining a healthy weight is also discussed.     10/24/2023   10:43 AM 10/22/2023    2:41 PM 10/22/2023   11:38 AM 08/26/2023   10:13 AM 08/26/2023    9:54 AM 08/08/2023    8:56 AM 06/09/2023   12:33 PM  BP/Weight  Systolic BP 119 135 137 120 125  111  Diastolic BP 80 65 77 82 76  77  Wt. (Lbs) 291.04  296  307.04 308.2 329  BMI 54.99 kg/m2  55.93 kg/m2  58.01 kg/m2 58.23 kg/m2 61.16 kg/m2

## 2023-10-26 NOTE — Assessment & Plan Note (Signed)
  Patient re-educated about  the importance of commitment to a  minimum of 150 minutes of exercise per week as able.  The importance of healthy food choices with portion control discussed, as well as eating regularly and within a 12 hour window most days. The need to choose clean , green food 50 to 75% of the time is discussed, as well as to make water  the primary drink and set a goal of 64 ounces water  daily.       10/24/2023   10:43 AM 10/22/2023   11:38 AM 08/26/2023    9:54 AM  Weight /BMI  Weight 291 lb 0.6 oz 296 lb 307 lb 0.6 oz  Height 5' 1 (1.549 m) 5' 1 (1.549 m) 5' 1 (1.549 m)  BMI 54.99 kg/m2 55.93 kg/m2 58.01 kg/m2    Ongoing weigh loss at healthy rate

## 2023-10-26 NOTE — Assessment & Plan Note (Signed)
Patient in for follow up of recent hospitalization. Discharge summary, and laboratory and radiology data are reviewed, and any questions or concerns  are discussed. Specific issues requiring follow up are specifically addressed.  

## 2023-10-28 ENCOUNTER — Ambulatory Visit

## 2023-11-13 ENCOUNTER — Ambulatory Visit

## 2023-11-13 VITALS — Ht 61.0 in | Wt 291.0 lb

## 2023-11-13 DIAGNOSIS — Z0000 Encounter for general adult medical examination without abnormal findings: Secondary | ICD-10-CM

## 2023-11-13 NOTE — Progress Notes (Signed)
 Subjective:   [name] is a 63 y.o. who presents for a Medicare Wellness preventive visit.  As a reminder, Annual Wellness Visits don't include a physical exam, and some assessments may be limited, especially if this visit is performed virtually. We may recommend an in-person follow-up visit with your provider if needed.  Visit Complete: Virtual I connected with  [name] on 11/13/23 by a audio enabled telemedicine application and verified that I am speaking with the correct person using two identifiers.  Patient Location: Other:  car  Provider Location: Home Office  I discussed the limitations of evaluation and management by telemedicine. The patient expressed understanding and agreed to proceed.  Vital Signs: Because this visit was a virtual/telehealth visit, some criteria may be missing or patient reported. Any vitals not documented were not able to be obtained and vitals that have been documented are patient reported.  VideoDeclined- This patient declined Librarian, academic. Therefore the visit was completed with audio only.  Persons Participating in Visit: Patient.  AWV Questionnaire: No: Patient Medicare AWV questionnaire was not completed prior to this visit.  Cardiac Risk Factors include: advanced age (>42men, >10 women);dyslipidemia;hypertension;obesity (BMI >30kg/m2)     Objective:    Today's Vitals   11/13/23 0903  Weight: 291 lb (132 kg)  Height: 5' 1 (1.549 m)  PainSc: 0-No pain   Body mass index is 54.98 kg/m.     11/13/2023    8:58 AM 10/22/2023   11:39 AM 05/13/2023    8:16 AM 05/06/2023    1:27 PM 02/17/2023    6:46 AM 11/21/2022    8:47 AM 09/24/2022    8:28 AM  Advanced Directives  Does Patient Have a Medical Advance Directive? No No No No No No   Would patient like information on creating a medical advance directive? Yes (MAU/Ambulatory/Procedural Areas - Information given)  No - Patient declined No -  Patient declined No - Patient declined No - Patient declined No - Patient declined    Current Medications (verified) Outpatient Encounter Medications as of 11/13/2023  Medication Sig   B-D TB SYRINGE 1CC/27GX1/2 27G X 1/2 1 ML MISC USE 1 SYRINGE ONCE WEEKLY TO INJECT METHOTREXATE    Calcium  Carb-Cholecalciferol (CALCIUM  + VITAMIN D3 PO) Take by mouth.   folic acid  (FOLVITE ) 1 MG tablet Take 2 tablets (2 mg total) by mouth daily.   methotrexate  50 MG/2ML injection INJECT 0.8 MLS (20 MG TOTAL) INTO THE SKIN ONCE A WEEK.   Multiple Vitamin (MULTIVITAMIN) capsule Take 1 capsule by mouth daily.   NEEDLE, DISP, 25 G (BD SAFETYGLIDE NEEDLE) 25G X 5/8 MISC ud   pantoprazole  (PROTONIX ) 40 MG tablet Take 1 tablet (40 mg total) by mouth daily.   SUMAtriptan  (IMITREX ) 25 MG tablet Take 1 tablet (25 mg total) by mouth every 2 (two) hours as needed for migraine. Take one tablet at headache onset. May repeat once after   2 hours if headache persists .Maximum of 2 tablets in 24 hours   triamterene -hydrochlorothiazide  (MAXZIDE -25) 37.5-25 MG tablet Take 0.5 tablets by mouth daily.   Upadacitinib  ER (RINVOQ ) 15 MG TB24 Take 1 tablet (15 mg total) by mouth daily.   [DISCONTINUED] metoCLOPramide  (REGLAN ) 10 MG tablet Take 1 tablet (10 mg total) by mouth every 6 (six) hours.   No facility-administered encounter medications on file as of 11/13/2023.    Allergies (verified) Aleve  [naproxen  sodium], Penicillins, Adhesive [tape], and Latex   History: Past Medical History:  Diagnosis Date  Allergy     Anemia    Annual visit for general adult medical examination with abnormal findings 01/02/2021   Anxiety    Sometimes   Chronic back pain    Degenerative joint disease    Left TKA in 07/2009-Dr. Landau   Depression 2001   doesn't take any meds   Essential hypertension 11/26/2011   GERD (gastroesophageal reflux disease)    Headache(784.0)    occasionally   Human bite of forearm 11/30/2019   Joint pain     Joint swelling    Morbid obesity (HCC)    Nocturia    Osteoarthritis of right knee 07/13/2013   Peripheral edema    to right leg;takes Furosemide  occasionally and hasnt had one in a month   Pre-diabetes    Rheumatoid arthritis (HCC)    Syncope    with palpitations; evaluated by [facility] in 2008 with normal echo   Past Surgical History:  Procedure Laterality Date   ABDOMINAL HYSTERECTOMY  1995   no oophorectomy   BIOPSY  11/26/2022   Procedure: BIOPSY;  Surgeon: Eartha Angelia Sieving, MD;  [facility];  Service: Gastroenterology;;   BIOPSY  02/17/2023   Procedure: BIOPSY;  Surgeon: Wilhelmenia Aloha Raddle., MD;  Location: [facility];  Service: Gastroenterology;;   CARPAL TUNNEL RELEASE  1991   Bilateral   CESAREAN SECTION     X2   CHOLECYSTECTOMY     CHOLECYSTECTOMY, LAPAROSCOPIC  2007   Dr. Mavis   COLONOSCOPY N/A 03/03/2013   Procedure: COLONOSCOPY;  Surgeon: Claudis RAYMOND Rivet, MD;  [facility];  Service: Endoscopy;  Laterality: N/A;  930   COLONOSCOPY WITH PROPOFOL  N/A 11/26/2022   Procedure: COLONOSCOPY WITH PROPOFOL ;  Surgeon: Eartha Angelia Sieving, MD;  [facility];  Service: Gastroenterology;  Laterality: N/A;  7:30 am, asa 3   CRANIECTOMY SUBOCCIPITAL W/ CERVICAL LAMINECTOMY / CHIARI  2007   Repair of Arnold-Chiari malformation   ESOPHAGOGASTRODUODENOSCOPY (EGD) WITH PROPOFOL  N/A 11/26/2022   Procedure: ESOPHAGOGASTRODUODENOSCOPY (EGD) WITH PROPOFOL ;  Surgeon: Eartha Angelia Sieving, MD;  [facility];  Service: Gastroenterology;  Laterality: N/A;   ESOPHAGOGASTRODUODENOSCOPY (EGD) WITH PROPOFOL  N/A 02/17/2023   Procedure: ESOPHAGOGASTRODUODENOSCOPY (EGD) WITH PROPOFOL ;  Surgeon: Wilhelmenia Aloha Raddle., MD;  Location: [facility];  Service: Gastroenterology;  Laterality: N/A;   EUS N/A 02/17/2023   Procedure: UPPER ENDOSCOPIC ULTRASOUND (EUS) RADIAL;  Surgeon: Wilhelmenia Aloha Raddle., MD;   Location: [facility];  Service: Gastroenterology;  Laterality: N/A;   EYE SURGERY     bilateral cataract surgery   GIVENS CAPSULE STUDY N/A 12/13/2022   Procedure: GIVENS CAPSULE STUDY;  Surgeon: Eartha Angelia Sieving, MD;  [facility];  Service: Gastroenterology;  Laterality: N/A;  7:30am   JOINT REPLACEMENT Left 2011   KNEE ARTHROSCOPY  1995   Dr. Anderson    THYROIDECTOMY, PARTIAL  2004   Left lobectomy - large benign nodule   TONSILLECTOMY     TOTAL KNEE ARTHROPLASTY  07/2009   Left knee , Dr. Josefina    TOTAL KNEE ARTHROPLASTY Right 07/13/2013   Procedure: TOTAL KNEE ARTHROPLASTY;  Surgeon: Fonda SHAUNNA Josefina, MD;  Location: [facility];  Service: Orthopedics;  Laterality: Right;   TUBAL LIGATION     UPPER GI ENDOSCOPY N/A 05/13/2023   Procedure: UPPER GI ENDOSCOPY;  Surgeon: Lyndel Deward PARAS, MD;  Location: [facility];  Service: General;  Laterality: N/A;   Family History  Problem Relation Age of Onset   Diabetes Mother  Depression Mother    Diabetes Father        emphsema    Hyperlipidemia Father    Emphysema Father    Rheum arthritis Sister    Breast cancer Sister    Cancer Sister    Healthy Daughter    Healthy Daughter    Healthy Daughter    Colon cancer Neg Hx    Social History   Socioeconomic History   Marital status: Significant Other    Spouse name: Not on file   Number of children: 3   Years of education: 15   Highest education level: Some college, no degree  Occupational History   Occupation: Employed seeking disabilty 2012  Tobacco Use   Smoking status: Never    Passive exposure: Never   Smokeless tobacco: Never  Vaping Use   Vaping status: Never Used  Substance and Sexual Activity   Alcohol use: Yes    Comment: Occasional   Drug use: No   Sexual activity: Yes    Birth control/protection: Condom, Surgical  Other Topics Concern   Not on file  Social History Narrative   Is in a new relationship    Social Drivers of Health    Financial Resource Strain: High Risk (11/13/2023)   Overall Financial Resource Strain (CARDIA)    Difficulty of Paying Living Expenses: Hard  Food Insecurity: No Food Insecurity (11/13/2023)   Hunger Vital Sign    Worried About Running Out of Food in the Last Year: Never true    Ran Out of Food in the Last Year: Never true  Recent Concern: Food Insecurity - Food Insecurity Present (08/19/2023)   Hunger Vital Sign    Worried About Running Out of Food in the Last Year: Sometimes true    Ran Out of Food in the Last Year: Never true  Transportation Needs: No Transportation Needs (11/13/2023)   PRAPARE - Administrator, Civil Service (Medical): No    Lack of Transportation (Non-Medical): No  Physical Activity: Sufficiently Active (11/13/2023)   Exercise Vital Sign    Days of Exercise per Week: 7 days    Minutes of Exercise per Session: 30 min  Recent Concern: Physical Activity - Insufficiently Active (08/19/2023)   Exercise Vital Sign    Days of Exercise per Week: 4 days    Minutes of Exercise per Session: 20 min  Stress: No Stress Concern Present (11/13/2023)   [employer] of Occupational Health - Occupational Stress Questionnaire    Feeling of Stress: Only a little  Social Connections: Moderately Integrated (11/13/2023)   Social Connection and Isolation Panel    Frequency of Communication with Friends and Family: More than three times a week    Frequency of Social Gatherings with Friends and Family: Once a week    Attends Religious Services: More than 4 times per year    Active Member of [employer] or Organizations: Yes    Attends Engineer, structural: More than 4 times per year    Marital Status: Divorced    Tobacco Counseling Counseling given: Yes    Clinical Intake:  Pre-visit preparation completed: Yes  Pain : No/denies pain Pain Score: 0-No pain     BMI - recorded: 54.98 Nutritional Status: BMI > 30  Obese Nutritional Risks: None Diabetes:  No  Lab Results  Component Value Date   HGBA1C 6.3 (H) 08/22/2023   HGBA1C 6.4 (H) 12/05/2022   HGBA1C 6.0 (H) 05/14/2022     How often do you need  to have someone help you when you read instructions, pamphlets, or other written materials from your doctor or pharmacy?: 1 - Never  Interpreter Needed?: No  Information entered by :: Rayssa Atha W CMA (AAMA)   Activities of Daily Living     11/13/2023    9:09 AM 05/13/2023    4:00 PM  In your present state of health, do you have any difficulty performing the following activities:  Hearing? 0 0  Vision? 0 0  Difficulty concentrating or making decisions? 0 0  Walking or climbing stairs? 0   Dressing or bathing? 0   Doing errands, shopping? 0 0  Preparing Food and eating ? N   Using the Toilet? N   In the past six months, have you accidently leaked urine? N   Do you have problems with loss of bowel control? N   Managing your Medications? N   Managing your Finances? N   Housekeeping or managing your Housekeeping? N     Patient Care Team: Antonetta Rollene BRAVO, MD as PCP - General (Family Medicine) Josefina Chew, MD (Orthopedic Surgery) Rothbart, Lamar HERO, MD (Cardiology) Alvan Dorn FALCON, MD as Consulting Physician (Cardiology) Corina Norleen SAUNDERS, PsyD as Consulting Physician (Psychology) Darroll Anes, DO (Optometry) Dolphus Reiter, MD as Consulting Physician (Rheumatology)  I have updated your Care Teams any recent Medical Services you may have received from other providers in the past year.     Assessment:   This is a routine wellness examination for [name].  Hearing/Vision screen Hearing Screening - Comments:: Patient denies any hearing difficulties.   Vision Screening - Comments:: Wears rx glasses - up to date with routine eye exams with  Anes Darroll at [facility] [place] location   Goals Addressed             This Visit's Progress    Exercise 3x per week (30 min per time)   On track    Patient Stated    On track    Take more time for myself and be with friends more      Patient Stated   On track    Lose some weight and get knees ready for trip in August     COMPLETED: Patient Stated       Patient's goal is to lose weight and have bariatric surgery       Depression Screen     11/13/2023    9:10 AM 10/24/2023   10:44 AM 08/26/2023    9:55 AM 06/03/2023    3:51 PM 02/27/2023    2:13 PM 12/05/2022    8:22 AM 09/24/2022    8:28 AM  PHQ 2/9 Scores  PHQ - 2 Score 0 0 0 0 0 0 0  PHQ- 9 Score 0 2  0  1     Fall Risk     11/13/2023    9:09 AM 10/24/2023   10:44 AM 08/26/2023    9:55 AM 06/03/2023    3:49 PM 02/27/2023    2:13 PM  Fall Risk   Falls in the past year? 0 1 1 0 0  Number falls in past yr: 0 0 1 0 0  Injury with Fall? 0 0 0 0 0  Risk for fall due to : No Fall Risks   No Fall Risks No Fall Risks  Follow up Falls evaluation completed;Education provided;Falls prevention discussed Falls evaluation completed Falls evaluation completed Falls evaluation completed Falls evaluation completed    MEDICARE RISK AT HOME:  Medicare Risk at Home Any stairs in or around the home?: Yes If so, are there any without handrails?: No Home free of loose throw rugs in walkways, pet beds, electrical cords, etc?: Yes Use of a cane, walker or w/c?: No Grab bars in the bathroom?: Yes Shower chair or bench in shower?: No Elevated toilet seat or a handicapped toilet?: No  TIMED UP AND GO:  Was the test performed?  No  Cognitive Function: 6CIT completed        11/13/2023    9:10 AM 09/04/2022    8:13 AM 08/27/2021    9:05 AM 01/01/2018    3:30 PM  6CIT Screen  What Year? 0 points 0 points 0 points 0 points  What month? 0 points 0 points 0 points 0 points  What time? 0 points 0 points 0 points 0 points  Count back from 20 0 points 0 points 0 points 0 points  Months in reverse 0 points 0 points 0 points 0 points  Repeat phrase 0 points 0 points 0 points 0 points  Total Score 0 points 0  points 0 points 0 points    Immunizations Immunization History  Administered Date(s) Administered   Influenza Split 01/16/2012   Influenza Whole 02/23/2007, 12/07/2008   Influenza, Seasonal, Injecte, Preservative Fre 12/05/2022   Influenza,inj,Quad PF,6+ Mos 12/07/2012, 02/15/2014, 11/28/2014, 01/09/2016, 04/07/2017, 12/18/2017, 02/16/2019, 12/09/2019, 01/02/2021, 11/16/2021   Moderna Covid-19 Fall Seasonal Vaccine 48yrs & older 05/06/2022   Moderna Sars-Covid-2 Vaccination 06/09/2019, 07/07/2019, 03/06/2020, 10/10/2020, 05/06/2022   PNEUMOCOCCAL CONJUGATE-20 06/03/2023   Pneumococcal Polysaccharide-23 07/31/2015   Td 10/23/2005   Tdap 11/30/2019   Zoster Recombinant(Shingrix) 02/02/2021, 04/06/2021    Screening Tests Health Maintenance  Topic Date Due   COVID-19 Vaccine (6 - 2024-25 season) 11/17/2022   INFLUENZA VACCINE  10/17/2023   Diabetic kidney evaluation - Urine ACR  02/27/2028 (Originally 07/30/2016)   HEMOGLOBIN A1C  02/21/2024   MAMMOGRAM  04/03/2024   Diabetic kidney evaluation - eGFR measurement  10/21/2024   Medicare Annual Wellness (AWV)  11/12/2024   Cervical Cancer Screening (HPV/Pap Cotest)  05/19/2026   DTaP/Tdap/Td (3 - Td or Tdap) 11/29/2029   Colonoscopy  11/25/2032   Pneumococcal Vaccine: 50+ Years  Completed   Hepatitis C Screening  Completed   HIV Screening  Completed   Zoster Vaccines- Shingrix  Completed   Hepatitis B Vaccines 19-59 Average Risk  Aged Out   HPV VACCINES  Aged Out   Meningococcal B Vaccine  Aged Out   FOOT EXAM  Discontinued   OPHTHALMOLOGY EXAM  Discontinued    Health Maintenance  Health Maintenance Due  Topic Date Due   COVID-19 Vaccine (6 - 2024-25 season) 11/17/2022   INFLUENZA VACCINE  10/17/2023   Health Maintenance Items Addressed: Routine health screenings are up to date. Patient is aware of recommended vaccines.   Additional Screening:  Vision Screening: Recommended annual ophthalmology exams for early detection  of glaucoma and other disorders of the eye. Would you like a referral to an eye doctor? No    Dental Screening: Recommended annual dental exams for proper oral hygiene  Community Resource Referral / Chronic Care Management: CRR required this visit?  No   CCM required this visit?  No   Plan:    I have personally reviewed and noted the following in the patient's chart:   Medical and social history Use of alcohol, tobacco or illicit drugs  Current medications and supplements including opioid prescriptions. Patient is not currently taking opioid  prescriptions. Functional ability and status Nutritional status Physical activity Advanced directives List of other physicians Hospitalizations, surgeries, and ER visits in previous 12 months Vitals Screenings to include cognitive, depression, and falls Referrals and appointments  In addition, I have reviewed and discussed with patient certain preventive protocols, quality metrics, and best practice recommendations. A written personalized care plan for preventive services as well as general preventive health recommendations were provided to patient.   Velda Wendt, CMA   11/13/2023   After Visit Summary: (MyChart) Due to this being a telephonic visit, the after visit summary with patients personalized plan was offered to patient via MyChart   Notes: Nothing significant to report at this time.

## 2023-11-13 NOTE — Patient Instructions (Signed)
 [name] , Thank you for taking time out of your busy schedule to complete your Annual Wellness Visit with me. I enjoyed our conversation and look forward to speaking with you again next year. I, as well as your care team,  appreciate your ongoing commitment to your health goals. Please review the following plan we discussed and let me know if I can assist you in the future.  Your Game plan/ To Do List   Follow up Visits: We will see or speak with you next year for your Next Medicare AWV with our clinical staff   Clinician Recommendations:  Aim for 30 minutes of exercise or brisk walking, 6-8 glasses of water , and 5 servings of fruits and vegetables each day.    Wishing you many blessings and good health during the next year until our next visit.  -Pamelyn Bancroft   This is a list of the screenings recommended for you:  Health Maintenance  Topic Date Due   COVID-19 Vaccine (6 - 2024-25 season) 11/17/2022   Flu Shot  10/17/2023   Yearly kidney health urinalysis for diabetes  02/27/2028*   Hemoglobin A1C  02/21/2024   Mammogram  04/03/2024   Yearly kidney function blood test for diabetes  10/21/2024   Medicare Annual Wellness Visit  11/12/2024   Pap with HPV screening  05/19/2026   DTaP/Tdap/Td vaccine (3 - Td or Tdap) 11/29/2029   Colon Cancer Screening  11/25/2032   Pneumococcal Vaccine for age over 12  Completed   Hepatitis C Screening  Completed   HIV Screening  Completed   Zoster (Shingles) Vaccine  Completed   Hepatitis B Vaccine  Aged Out   HPV Vaccine  Aged Out   Meningitis B Vaccine  Aged Out   Complete foot exam   Discontinued   Eye exam for diabetics  Discontinued  *Topic was postponed. The date shown is not the original due date.    Advanced directives: (Provided) Advance directive discussed with you today. I have provided a copy for you to complete at home and have notarized. Once this is complete, please bring a copy in to our office so we can scan it into your chart.   Advance Care Planning is important because it:  [x]  Makes sure you receive the medical care that is consistent with your values, goals, and preferences  [x]  It provides guidance to your family and loved ones and reduces their decisional burden about whether or not they are making the right decisions based on your wishes.  Follow the link provided in your after visit summary or read over the paperwork we have mailed to you to help you started getting your Advance Directives in place. If you need assistance in completing these, please reach out to us  so that we can help you!  See attachments for Preventive Care and Fall Prevention Tips.

## 2023-11-14 ENCOUNTER — Other Ambulatory Visit: Payer: Self-pay | Admitting: Physician Assistant

## 2023-11-14 ENCOUNTER — Other Ambulatory Visit: Payer: Self-pay

## 2023-11-14 DIAGNOSIS — M0609 Rheumatoid arthritis without rheumatoid factor, multiple sites: Secondary | ICD-10-CM

## 2023-11-14 NOTE — Telephone Encounter (Signed)
 Last Fill: 10/15/2023  Labs: 10/22/2023 Hemoglobin is low and stable. CMP is normal. Please forward results to her PCP.   TB Gold: 03/14/2023 TB Gold Negative    Next Visit: 11/27/2023  Last Visit: 06/09/2023  DX: Rheumatoid arthritis of multiple sites with negative rheumatoid factor   Current Dose per office note 06/09/2023: Rinvoq  15 mg 1 tablet by mouth daily.  Okay to refill Rinvoq ?

## 2023-11-14 NOTE — Progress Notes (Signed)
 Office Visit Note  Patient: [name]             Date of Birth: Jul 26, 1960           MRN: [id number]             PCP: Antonetta Rollene BRAVO, MD Referring: Antonetta Rollene BRAVO, MD Visit Date: 11/27/2023 Occupation: @GUAROCC @  Subjective:  Medication management  History of Present Illness: [name] is a 63 y.o. female with seronegative rheumatoid arthritis and osteoarthritis overlap.  She denies any increased joint pain or joint swelling.  She denies any rheumatoid arthritis flares since the last visit.  Patient states that about 4 months ago she discontinued methotrexate  1 mL subcu weekly as her joints started feeling better.  She has not noticed any difference in her symptoms coming off methotrexate .  She has been on Rinvoq  15 mg p.o. daily.  She denies any side effects or interruption in the treatment.  She recovered well from the robotic sleeve gastrectomy in February 2025.  She had 81 pounds weight loss since February 2021.    Activities of Daily Living:  Patient reports morning stiffness for 0 minute.   Patient Denies nocturnal pain.  Difficulty dressing/grooming: Denies Difficulty climbing stairs: Reports Difficulty getting out of chair: Denies Difficulty using hands for taps, buttons, cutlery, and/or writing: Reports  Review of Systems  Constitutional:  Positive for fatigue.  HENT:  Negative for mouth sores and mouth dryness.   Eyes:  Negative for dryness.  Respiratory:  Negative for shortness of breath.   Cardiovascular:  Negative for chest pain and palpitations.  Gastrointestinal:  Negative for blood in stool, constipation and diarrhea.  Endocrine: Negative for increased urination.  Genitourinary:  Negative for involuntary urination.  Musculoskeletal:  Negative for joint pain, gait problem, joint pain, joint swelling, myalgias, muscle weakness, morning stiffness, muscle tenderness and myalgias.  Skin:  Positive for sensitivity to sunlight. Negative  for color change, rash and hair loss.  Allergic/Immunologic: Negative for susceptible to infections.  Neurological:  Positive for dizziness. Negative for headaches.  Hematological:  Negative for swollen glands.  Psychiatric/Behavioral:  Positive for sleep disturbance. Negative for depressed mood. The patient is not nervous/anxious.     PMFS History:  Patient Active Problem List   Diagnosis Date Noted   Encounter for examination following treatment at hospital 10/26/2023   Encounter for immunization 06/03/2023   Gastritis and gastroduodenitis 02/17/2023   Nodule of esophagus 02/17/2023   Anemia 09/18/2022   Sprain, gluteus medius, left, initial encounter 04/22/2022   Seasonal allergies 04/08/2021   Right leg weakness 01/07/2021   Tendinitis of right ankle 10/12/2019   High risk medication use 01/07/2019   Generalized joint pain 10/20/2018   Major depressive disorder, recurrent episode, moderate (HCC) 08/29/2017   Back pain with sciatica 11/13/2015   Knee osteoarthritis 07/13/2013   Snoring 01/03/2013   Thoracic spine pain 12/29/2012   Vitamin D  deficiency 12/08/2012   GERD (gastroesophageal reflux disease) 07/15/2012   Exercise intolerance 11/26/2011   Essential hypertension 11/26/2011   Migraine headache    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   Prediabetes 07/23/2010   Morbid obesity (HCC) 12/03/2007    Past Medical History:  Diagnosis Date   Allergy     Anemia    Annual visit for general adult medical examination with abnormal findings 01/02/2021   Anxiety    Sometimes   Chronic back pain    Degenerative joint disease    Left TKA in 07/2009-Dr. Josefina  Depression 2001   doesn't take any meds   Essential hypertension 11/26/2011   GERD (gastroesophageal reflux disease)    Headache(784.0)    occasionally   Human bite of forearm 11/30/2019   Joint pain    Joint swelling    Morbid obesity (HCC)    Nocturia    Osteoarthritis of right knee 07/13/2013   Peripheral  edema    to right leg;takes Furosemide  occasionally and hasnt had one in a month   Pre-diabetes    Rheumatoid arthritis (HCC)    Syncope    with palpitations; evaluated by [facility] in 2008 with normal echo    Family History  Problem Relation Age of Onset   Diabetes Mother    Depression Mother    Diabetes Father        emphsema    Hyperlipidemia Father    Emphysema Father    Rheum arthritis Sister    Breast cancer Sister    Cancer Sister    Healthy Daughter    Healthy Daughter    Healthy Daughter    Colon cancer Neg Hx    Past Surgical History:  Procedure Laterality Date   ABDOMINAL HYSTERECTOMY  1995   no oophorectomy   BIOPSY  11/26/2022   Procedure: BIOPSY;  Surgeon: Eartha Angelia Sieving, MD;  [facility];  Service: Gastroenterology;;   BIOPSY  02/17/2023   Procedure: BIOPSY;  Surgeon: Wilhelmenia Aloha Raddle., MD;  Location: [facility];  Service: Gastroenterology;;   CARPAL TUNNEL RELEASE  1991   Bilateral   CESAREAN SECTION     X2   CHOLECYSTECTOMY     CHOLECYSTECTOMY, LAPAROSCOPIC  2007   Dr. Mavis   COLONOSCOPY N/A 03/03/2013   Procedure: COLONOSCOPY;  Surgeon: Claudis RAYMOND Rivet, MD;  [facility]: [facility];  Service: Endoscopy;  Laterality: N/A;  930   COLONOSCOPY WITH PROPOFOL  N/A 11/26/2022   Procedure: COLONOSCOPY WITH PROPOFOL ;  Surgeon: Eartha Angelia Sieving, MD;  [facility];  Service: Gastroenterology;  Laterality: N/A;  7:30 am, asa 3   CRANIECTOMY SUBOCCIPITAL W/ CERVICAL LAMINECTOMY / CHIARI  2007   Repair of Arnold-Chiari malformation   ESOPHAGOGASTRODUODENOSCOPY (EGD) WITH PROPOFOL  N/A 11/26/2022   Procedure: ESOPHAGOGASTRODUODENOSCOPY (EGD) WITH PROPOFOL ;  Surgeon: Eartha Angelia Sieving, MD;  [facility];  Service: Gastroenterology;  Laterality: N/A;   ESOPHAGOGASTRODUODENOSCOPY (EGD) WITH PROPOFOL  N/A 02/17/2023   Procedure: ESOPHAGOGASTRODUODENOSCOPY (EGD) WITH PROPOFOL ;  Surgeon:  Wilhelmenia Aloha Raddle., MD;  Location: [facility];  Service: Gastroenterology;  Laterality: N/A;   EUS N/A 02/17/2023   Procedure: UPPER ENDOSCOPIC ULTRASOUND (EUS) RADIAL;  Surgeon: Wilhelmenia Aloha Raddle., MD;  Location: [facility];  Service: Gastroenterology;  Laterality: N/A;   EYE SURGERY     bilateral cataract surgery   GIVENS CAPSULE STUDY N/A 12/13/2022   Procedure: GIVENS CAPSULE STUDY;  Surgeon: Eartha Angelia Sieving, MD;  [facility];  Service: Gastroenterology;  Laterality: N/A;  7:30am   JOINT REPLACEMENT Left 2011   KNEE ARTHROSCOPY  1995   Dr. Anderson    THYROIDECTOMY, PARTIAL  2004   Left lobectomy - large benign nodule   TONSILLECTOMY     TOTAL KNEE ARTHROPLASTY  07/2009   Left knee , Dr. Josefina    TOTAL KNEE ARTHROPLASTY Right 07/13/2013   Procedure: TOTAL KNEE ARTHROPLASTY;  Surgeon: Fonda SHAUNNA Josefina, MD;  Location: [facility];  Service: Orthopedics;  Laterality: Right;   TUBAL LIGATION     UPPER GI ENDOSCOPY N/A 05/13/2023  Procedure: UPPER GI ENDOSCOPY;  Surgeon: Lyndel Deward PARAS, MD;  Location: [facility];  Service: General;  Laterality: N/A;   Social History   Social History Narrative   Is in a new relationship    Immunization History  Administered Date(s) Administered   Influenza Split 01/16/2012   Influenza Whole 02/23/2007, 12/07/2008   Influenza, Seasonal, Injecte, Preservative Fre 12/05/2022, 11/18/2023   Influenza,inj,Quad PF,6+ Mos 12/07/2012, 02/15/2014, 11/28/2014, 01/09/2016, 04/07/2017, 12/18/2017, 02/16/2019, 12/09/2019, 01/02/2021, 11/16/2021   Moderna Covid-19 Fall Seasonal Vaccine 50yrs & older 05/06/2022   Moderna Sars-Covid-2 Vaccination 06/09/2019, 07/07/2019, 03/06/2020, 10/10/2020, 05/06/2022   PNEUMOCOCCAL CONJUGATE-20 06/03/2023   Pneumococcal Polysaccharide-23 07/31/2015   Td 10/23/2005   Tdap 11/30/2019   Zoster Recombinant(Shingrix) 02/02/2021, 04/06/2021     Objective: Vital Signs: BP 116/76   Pulse (!) 57    Temp 98.6 F (37 C)   Resp 14   Ht 5' 1 (1.549 m)   Wt 283 lb 3.2 oz (128.5 kg)   BMI 53.51 kg/m    Physical Exam Vitals and nursing note reviewed.  Constitutional:      Appearance: She is well-developed.  HENT:     Head: Normocephalic and atraumatic.  Eyes:     Conjunctiva/sclera: Conjunctivae normal.  Cardiovascular:     Rate and Rhythm: Normal rate and regular rhythm.     Heart sounds: Normal heart sounds.  Pulmonary:     Effort: Pulmonary effort is normal.     Breath sounds: Normal breath sounds.  Abdominal:     General: Bowel sounds are normal.     Palpations: Abdomen is soft.  Musculoskeletal:     Cervical back: Normal range of motion.  Lymphadenopathy:     Cervical: No cervical adenopathy.  Skin:    General: Skin is warm and dry.     Capillary Refill: Capillary refill takes less than 2 seconds.  Neurological:     Mental Status: She is alert and oriented to person, place, and time.  Psychiatric:        Behavior: Behavior normal.      Musculoskeletal Exam: Cervical spine was in good range of motion.  Lumbar spine was difficult to assess.  She had no tenderness over thoracic or lumbar spine.  Shoulders were in good range of motion without any discomfort.  She has about 5 degree contracture in both elbows with no synovitis.  She had synovial thickening over her MCP joints with no synovitis.  No PIP or DIP thickening was noted.  Hip joints were in good range of motion.  She had bilateral total knee replacement without any warmth or swelling.  There was no tenderness over her ankles or MTPs.  CDAI Exam: CDAI Score: -- Patient Global: 0 / 100; Provider Global: 0 / 100 Swollen: --; Tender: -- Joint Exam 11/27/2023   No joint exam has been documented for this visit   There is currently no information documented on the homunculus. Go to the Rheumatology activity and complete the homunculus joint exam.  Investigation: No additional findings.  Imaging: No results  found.  Recent Labs: Lab Results  Component Value Date   WBC 7.6 10/22/2023   HGB 11.2 (L) 10/22/2023   PLT 353 10/22/2023   NA 136 10/22/2023   K 3.9 10/22/2023   CL 101 10/22/2023   CO2 25 10/22/2023   GLUCOSE 97 10/22/2023   BUN 23 (H) 10/22/2023   CREATININE 0.95 10/22/2023   BILITOT 0.6 10/17/2023   ALKPHOS 100 08/22/2023   AST 16 10/17/2023  ALT 10 10/17/2023   PROT 7.2 10/17/2023   ALBUMIN 3.8 08/22/2023   CALCIUM  9.2 10/22/2023   GFRAA 85 08/21/2020   QFTBGOLDPLUS NEGATIVE 03/14/2023   October 17, 2023 lipid panel normal, July second 2025 hemoglobin A1c 6.3, September 17, 2023 PPD negative August 22, 2023 vitamin D  62.2. Speciality Comments: No specialty comments available.  Procedures:  No procedures performed Allergies: Aleve  [naproxen  sodium], B12 folate [cobalamin combinations], Penicillins, Adhesive [tape], and Latex   Assessment / Plan:     Visit Diagnoses: Rheumatoid arthritis of multiple sites with negative rheumatoid factor (HCC)-RF negative, anti-CCP negative, history of severe inflammatory arthritis and radiographic changes: Patient states she discontinued methotrexate  about 4 months ago.  She has not noticed any flares.  She has been on Rinvoq  monotherapy.  She decided to come off methotrexate  as she was doing well after intentional weight loss.  High risk medication use - Rinvoq  15 mg 1 tablet by mouth daily, (MTX 0.8 ml sq injections once weekly discontinued 4 months ago) folic acid  2 mg by mouth daily.  October 17, 2023 CBC and CMP were normal except hemoglobin 11.0 in August.  TB Gold was negative on March 14, 2023.  Information reimmunization was placed in the AVS.  She was advised to hold Rinvoq  if she develops an infection and resume until infection resolves.  FDA blackbox warning associated with MACE was also reviewed and placed in the AVS.  Annual skin examination was advised to screen for skin cancer.  Use of sunscreen and sun protection was discussed.  Plan:  QuantiFERON-TB Gold Plus  Primary osteoarthritis of both hands -she has rheumatoid arthritis and osteoarthritis overlap.  She had bilateral MCP thickening, PIP and DIP prominence with no synovitis.  X-rays of both hands obtained on 01/09/2023.  Joint protection and muscle strengthening was discussed.  Status post total bilateral knee replacement-she had good range of motion without discomfort.  Primary osteoarthritis of both feet-she is denies any discomfort today.  DDD (degenerative disc disease), cervical-she good range of motion without discomfort.  Spondylosis of lumbar spine-she denies discomfort today.  Essential hypertension-blood sugar was normal at 116/76.  Other medical problems listed as follows:  Chronic diastolic heart failure (HCC)  Hx of migraines  History of gastroesophageal reflux (GERD)  Prediabetes  History of depression  S/P partial thyroidectomy  Vitamin D  deficiency-vitamin D  was 39.9 on December 05, 2022.  Vitamin D  supplement was advised.  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  She had weight loss of about 80 pounds since her surgery.  Orders: Orders Placed This Encounter  Procedures   QuantiFERON-TB Gold Plus   No orders of the defined types were placed in this encounter.    Follow-Up Instructions: Return in about 5 months (around 04/28/2024) for Rheumatoid arthritis, Osteoarthritis.   Maya Nash, MD  Note - This record has been created using Animal nutritionist.  Chart creation errors have been sought, but may not always  have been located. Such creation errors do not reflect on  the standard of medical care.

## 2023-11-17 MED ORDER — RINVOQ 15 MG PO TB24
15.0000 mg | ORAL_TABLET | Freq: Every day | ORAL | 0 refills | Status: DC
  Filled 2023-11-18: qty 90, 90d supply, fill #0
  Filled 2023-11-19 – 2023-11-27 (×2): qty 30, 30d supply, fill #0
  Filled 2023-12-22: qty 30, 30d supply, fill #1
  Filled 2024-01-15: qty 30, 30d supply, fill #2

## 2023-11-18 ENCOUNTER — Encounter: Payer: Self-pay | Admitting: Family Medicine

## 2023-11-18 ENCOUNTER — Ambulatory Visit (INDEPENDENT_AMBULATORY_CARE_PROVIDER_SITE_OTHER): Admitting: Family Medicine

## 2023-11-18 ENCOUNTER — Other Ambulatory Visit: Payer: Self-pay

## 2023-11-18 VITALS — BP 127/70 | HR 64 | Resp 16 | Ht 61.0 in | Wt 281.0 lb

## 2023-11-18 DIAGNOSIS — Z23 Encounter for immunization: Secondary | ICD-10-CM

## 2023-11-18 DIAGNOSIS — G43E09 Chronic migraine with aura, not intractable, without status migrainosus: Secondary | ICD-10-CM | POA: Diagnosis not present

## 2023-11-18 DIAGNOSIS — I10 Essential (primary) hypertension: Secondary | ICD-10-CM

## 2023-11-18 DIAGNOSIS — R7303 Prediabetes: Secondary | ICD-10-CM

## 2023-11-18 MED ORDER — PANTOPRAZOLE SODIUM 40 MG PO TBEC: 40.0000 mg | DELAYED_RELEASE_TABLET | Freq: Every day | ORAL | 1 refills | Status: DC

## 2023-11-18 NOTE — Progress Notes (Signed)
 [name]     MRN: [id number]      DOB: 06/09/1960  Chief Complaint  Patient presents with   Hypertension    12 week follow up     HPI [name] is here for follow up and re-evaluation of chronic medical conditions, medication management and review of any available recent lab and radiology data.  Preventive health is updated, specifically  Cancer screening and Immunization.   Questions or concerns regarding consultations or procedures which the PT has had in the interim are  addressed. The PT denies any adverse reactions to current medications since the last visit.  There are no new concerns.  There are no specific complaints   ROS Denies recent fever or chills. Denies sinus pressure, nasal congestion, ear pain or sore throat. Denies chest congestion, productive cough or wheezing. Denies chest pains, palpitations and leg swelling Denies abdominal pain, nausea, vomiting,diarrhea or constipation.   Denies dysuria, frequency, hesitancy or incontinence. Denies joint pain, swelling and limitation in mobility. Denies headaches, seizures, numbness, or tingling. Denies depression, anxiety or insomnia. Denies skin break down or rash.   PE  BP 127/70   Pulse 64   Resp 16   Ht 5' 1 (1.549 m)   Wt 281 lb 0.6 oz (127.5 kg)   SpO2 98%   BMI 53.10 kg/m   Patient alert and oriented and in no cardiopulmonary distress.  HEENT: No facial asymmetry, EOMI,     Neck supple .  Chest: Clear to auscultation bilaterally.  CVS: S1, S2 no murmurs, no S3.Regular rate.  ABD: Soft non tender.   Ext: No edema  MS: Adequate ROM spine, shoulders, hips and knees.  Skin: Intact, no ulcerations or rash noted.  Psych: Good eye contact, normal affect. Memory intact not anxious or depressed appearing.  CNS: CN 2-12 intact, power,  normal throughout.no focal deficits noted.   Assessment & Plan  Essential hypertension Controlled, no change in medication DASH diet and  commitment to daily physical activity for a minimum of 30 minutes discussed and encouraged, as a part of hypertension management. The importance of attaining a healthy weight is also discussed.     11/18/2023   10:02 AM 11/13/2023    9:03 AM 10/24/2023   10:43 AM 10/22/2023    2:41 PM 10/22/2023   11:38 AM 08/26/2023   10:13 AM 08/26/2023    9:54 AM  BP/Weight  Systolic BP 127 -- 119 135 137 120 125  Diastolic BP 70 -- 80 65 77 82 76  Wt. (Lbs) 281.04 291 291.04  296  307.04  BMI 53.1 kg/m2 54.98 kg/m2 54.99 kg/m2  55.93 kg/m2  58.01 kg/m2       Migraine headache Controlled responds to immitrex , one headache since last visit  Morbid obesity (HCC)  Patient re-educated about  the importance of commitment to a  minimum of 150 minutes of exercise per week as able.  The importance of healthy food choices with portion control discussed, as well as eating regularly and within a 12 hour window most days. The need to choose clean , green food 50 to 75% of the time is discussed, as well as to make water  the primary drink and set a goal of 64 ounces water  daily.       11/18/2023   10:02 AM 11/13/2023    9:03 AM 10/24/2023   10:43 AM  Weight /BMI  Weight 281 lb 0.6 oz 291 lb 291 lb 0.6 oz  Height 5'  1 (1.549 m) 5' 1 (1.549 m) 5' 1 (1.549 m)  BMI 53.1 kg/m2 54.98 kg/m2 54.99 kg/m2    Excellent ongoing weight loss since bariatric surgery  Encounter for immunization After obtaining informed consent, the influenza vaccine is  administered , with no adverse effect noted at the time of administration.   Prediabetes Patient educated about the importance of limiting  Carbohydrate intake , the need to commit to daily physical activity for a minimum of 30 minutes , and to commit weight loss. The fact that changes in all these areas will reduce or eliminate all together the development of diabetes is stressed.      Latest Ref Rng & Units 10/22/2023    1:16 PM 10/17/2023   11:36 AM 08/22/2023     9:51 AM 08/22/2023    9:48 AM 05/13/2023    4:07 PM  Diabetic Labs  HbA1c 4.8 - 5.6 %   6.3     Chol <200 mg/dL  844   843    HDL > OR = 50 mg/dL  51   56    Calc LDL mg/dL (calc)  87   87    Triglycerides <150 mg/dL  81   64    Creatinine 0.44 - 1.00 mg/dL 9.04  9.06   9.01  9.14       11/18/2023   10:02 AM 11/13/2023    9:03 AM 10/24/2023   10:43 AM 10/22/2023    2:41 PM 10/22/2023   11:38 AM 08/26/2023   10:13 AM 08/26/2023    9:54 AM  BP/Weight  Systolic BP 127 -- 119 135 137 120 125  Diastolic BP 70 -- 80 65 77 82 76  Wt. (Lbs) 281.04 291 291.04  296  307.04  BMI 53.1 kg/m2 54.98 kg/m2 54.99 kg/m2  55.93 kg/m2  58.01 kg/m2      10/12/2019    2:20 PM 07/31/2015    8:45 AM  Foot/eye exam completion dates  Foot Form Completion Done Done    Updated lab needed at/ before next visit.

## 2023-11-18 NOTE — Assessment & Plan Note (Signed)
  Patient re-educated about  the importance of commitment to a  minimum of 150 minutes of exercise per week as able.  The importance of healthy food choices with portion control discussed, as well as eating regularly and within a 12 hour window most days. The need to choose clean , green food 50 to 75% of the time is discussed, as well as to make water  the primary drink and set a goal of 64 ounces water  daily.       11/18/2023   10:02 AM 11/13/2023    9:03 AM 10/24/2023   10:43 AM  Weight /BMI  Weight 281 lb 0.6 oz 291 lb 291 lb 0.6 oz  Height 5' 1 (1.549 m) 5' 1 (1.549 m) 5' 1 (1.549 m)  BMI 53.1 kg/m2 54.98 kg/m2 54.99 kg/m2    Excellent ongoing weight loss since bariatric surgery

## 2023-11-18 NOTE — Assessment & Plan Note (Signed)
 Controlled responds to immitrex , one headache since last visit

## 2023-11-18 NOTE — Assessment & Plan Note (Signed)
 After obtaining informed consent, the influenza vaccine is  administered , with no adverse effect noted at the time of administration.

## 2023-11-18 NOTE — Assessment & Plan Note (Signed)
 Patient educated about the importance of limiting  Carbohydrate intake , the need to commit to daily physical activity for a minimum of 30 minutes , and to commit weight loss. The fact that changes in all these areas will reduce or eliminate all together the development of diabetes is stressed.      Latest Ref Rng & Units 10/22/2023    1:16 PM 10/17/2023   11:36 AM 08/22/2023    9:51 AM 08/22/2023    9:48 AM 05/13/2023    4:07 PM  Diabetic Labs  HbA1c 4.8 - 5.6 %   6.3     Chol <200 mg/dL  844   843    HDL > OR = 50 mg/dL  51   56    Calc LDL mg/dL (calc)  87   87    Triglycerides <150 mg/dL  81   64    Creatinine 0.44 - 1.00 mg/dL 9.04  9.06   9.01  9.14       11/18/2023   10:02 AM 11/13/2023    9:03 AM 10/24/2023   10:43 AM 10/22/2023    2:41 PM 10/22/2023   11:38 AM 08/26/2023   10:13 AM 08/26/2023    9:54 AM  BP/Weight  Systolic BP 127 -- 119 135 137 120 125  Diastolic BP 70 -- 80 65 77 82 76  Wt. (Lbs) 281.04 291 291.04  296  307.04  BMI 53.1 kg/m2 54.98 kg/m2 54.99 kg/m2  55.93 kg/m2  58.01 kg/m2      10/12/2019    2:20 PM 07/31/2015    8:45 AM  Foot/eye exam completion dates  Foot Form Completion Done Done    Updated lab needed at/ before next visit.

## 2023-11-18 NOTE — Assessment & Plan Note (Signed)
 Controlled, no change in medication DASH diet and commitment to daily physical activity for a minimum of 30 minutes discussed and encouraged, as a part of hypertension management. The importance of attaining a healthy weight is also discussed.     11/18/2023   10:02 AM 11/13/2023    9:03 AM 10/24/2023   10:43 AM 10/22/2023    2:41 PM 10/22/2023   11:38 AM 08/26/2023   10:13 AM 08/26/2023    9:54 AM  BP/Weight  Systolic BP 127 -- 119 135 137 120 125  Diastolic BP 70 -- 80 65 77 82 76  Wt. (Lbs) 281.04 291 291.04  296  307.04  BMI 53.1 kg/m2 54.98 kg/m2 54.99 kg/m2  55.93 kg/m2  58.01 kg/m2

## 2023-11-18 NOTE — Patient Instructions (Signed)
 Annual exam mid to end January  Flu vaccine today   Need covid and RSV vaccines at your pharmacy  Congrats on excellent weight loss   Non fasting hBA1C , bmp and EGFr 3 to 5 days before next appt  It is important that you exercise regularly at least 30 minutes 5 times a week. If you develop chest pain, have severe difficulty breathing, or feel very tired, stop exercising immediately and seek medical attention   Thanks for choosing [facility], we consider it a privelige to serve you.

## 2023-11-19 ENCOUNTER — Other Ambulatory Visit: Payer: Self-pay

## 2023-11-21 ENCOUNTER — Other Ambulatory Visit: Payer: Self-pay

## 2023-11-25 ENCOUNTER — Encounter: Attending: Family Medicine | Admitting: Skilled Nursing Facility1

## 2023-11-25 ENCOUNTER — Encounter: Payer: Self-pay | Admitting: Skilled Nursing Facility1

## 2023-11-25 DIAGNOSIS — Z6841 Body Mass Index (BMI) 40.0 and over, adult: Secondary | ICD-10-CM | POA: Insufficient documentation

## 2023-11-25 DIAGNOSIS — Z713 Dietary counseling and surveillance: Secondary | ICD-10-CM | POA: Diagnosis not present

## 2023-11-25 NOTE — Progress Notes (Unsigned)
 Bariatric Nutrition Follow-Up Visit Medical Nutrition Therapy  Appt Start Time: 5:10   End Time: 5:33  Surgery date: 05/13/2023 Surgery type: Sleeve Gastrectomy  NUTRITION ASSESSMENT  Anthropometrics  Start weight at [facility]: 351.0 lbs (date: 09/24/2022)  Height: 61 in Weight today: 284 lb  Clinical  Medical hx: obesity; HTN Medications: folic acid , vit D, phentermine , lasix , Maxzide , rinvoq  Labs: BUN 23, hemoglobin 11.2, MCH 25.9, A1C 6.3, vitamin D  62.2 Notable signs/symptoms: none noted Any previous deficiencies? No Allergies: penicillin, aleve  (naproxen ) Bowel Habits: Every day to every other day; takes Miralax  3 days per week  Body Composition Scale 05/27/2023 08/08/2023  Current Body Weight 337.3 308.2  Total Body Fat % 54.2 52.8  Visceral Fat 33 29  Fat-Free Mass % 45.7 47.1   Total Body Water  % 37.3 38.0  Muscle-Mass lbs 28.1 27.8  BMI 64.1 58.5  Body Fat Displacement           Torso  lbs 113.6 101.0         Left Leg  lbs 22.7 20.2         Right Leg  lbs 22.7 20.2         Left Arm  lbs 11.3 10.1         Right Arm  lbs 11.3 10.1   Lifestyle & Dietary Hx  Pt states she has recognized she cannot watch TV and eat because she accidentally overeats.    Estimated daily fluid intake: 50-64 oz Estimated daily protein intake: 60+ g (with protein shakes Supplements: multivitamin and calcium  Current average weekly physical activity: treadmill, chair yoga stretch bands and light weights 5 days a week  24-Hr Dietary Recall First Meal: banana + protein shake Snack:   Second Meal: roast beef and mustard and triscuit crackers Snack: nuts  Third Meal: chicken + corn Snack: 2 sugar free popsicle  Beverages: un-sweet tea, water , mountain dew and coke zero sugar  Post-Op Goals/ Signs/ Symptoms Using straws: yes Drinking while eating: no Chewing/swallowing difficulties: feels like food is getting stuck Changes in vision: no Changes to mood/headaches: no Hair loss/changes  to skin/nails: no Difficulty focusing/concentrating: no Sweating: no Limb weakness: no Dizziness/lightheadedness: no Palpitations: no  Carbonated/caffeinated beverages: no N/V/D/C/Gas: Miralax  3 times per week; having a bowel movement every couple days  Abdominal pain: no Dumping syndrome: no   NUTRITION DIAGNOSIS  Overweight/obesity ([place]-3.3) related to past poor dietary habits and physical inactivity as evidenced by completed bariatric surgery and following dietary guidelines for continued weight loss and healthy nutrition status.   NUTRITION INTERVENTION Nutrition counseling (C-1) and education (E-2) to facilitate bariatric surgery goals, including: Creation of balanced and diverse meals to increase the intake of nutrient-rich foods that provide essential vitamins, minerals, fiber, and phytonutrients Variety of Fruits and Vegetables: Aim for a colorful array of fruits and vegetables to ensure a wide range of nutrients. Include a mix of leafy greens, berries, citrus fruits, cruciferous vegetables, and more. Whole Grains: Choose whole grains over refined grains. Examples include brown rice, quinoa, oats, whole wheat, and barley. Lean Proteins: Include lean sources of protein, such as poultry, fish, tofu, legumes, beans, lentils, and low-fat dairy products. Limit red and processed meats. Healthy Fats: Incorporate sources of healthy fats, including avocados, nuts, seeds, and olive oil. Limit saturated and trans fats found in fried and processed foods. Dairy or Dairy Alternatives: Choose low-fat or fat-free dairy products, or plant-based alternatives like almond or soy milk. Portion Control: Be mindful of portion sizes to avoid overeating.  Pay attention to hunger and satisfaction cues. Limit Added Sugars: Minimize the consumption of sugary beverages, snacks, and desserts. Check food labels for added sugars and opt for natural sources of sweetness such as whole  fruits. Hydration: Drink plenty of water  throughout the day. Limit sugary drinks and excessive caffeine intake. Moderate Sodium Intake: Reduce the consumption of high-sodium foods. Use herbs and spices for flavor instead of excessive salt. Meal Planning and Preparation: Plan and prepare meals ahead of time to make healthier choices more convenient. Include a mix of food groups in each meal. Limit Processed Foods: Minimize the intake of highly processed and packaged foods that are often high in added sugars, salt, and unhealthy fats. Regular Physical Activity: Combine a healthy diet with regular physical activity for overall well-being. Aim for at least 150 minutes of moderate-intensity aerobic exercise per week, along with strength training. Moderation and Balance: Enjoy treats and indulgent foods in moderation, emphasizing balance rather than strict restriction.  Handouts Provided Include  Standard Prep Plan Advancement Guide  Goals: Be sure to get enough vegetable sin throughout he day at least 2 times a day  2T is the peanut butter servings limit to 2 servings per day  Learning Style & Readiness for Change Teaching method utilized: Visual & Auditory  Demonstrated degree of understanding via: Teach Back  Readiness Level: ready Barriers to learning/adherence to lifestyle change: mobility/arthritis  RD's Notes for Next Visit Assess adherence to pt chosen goals  MONITORING & EVALUATION Dietary intake, weekly physical activity, body weight.   Next Steps Patient is to follow-up in February

## 2023-11-27 ENCOUNTER — Other Ambulatory Visit (HOSPITAL_COMMUNITY): Payer: Self-pay

## 2023-11-27 ENCOUNTER — Encounter: Payer: Self-pay | Admitting: Rheumatology

## 2023-11-27 ENCOUNTER — Ambulatory Visit: Attending: Rheumatology | Admitting: Rheumatology

## 2023-11-27 ENCOUNTER — Other Ambulatory Visit: Payer: Self-pay

## 2023-11-27 ENCOUNTER — Other Ambulatory Visit: Payer: Self-pay | Admitting: Pharmacy Technician

## 2023-11-27 VITALS — BP 116/76 | HR 57 | Temp 98.6°F | Resp 14 | Ht 61.0 in | Wt 283.2 lb

## 2023-11-27 DIAGNOSIS — M19041 Primary osteoarthritis, right hand: Secondary | ICD-10-CM | POA: Diagnosis not present

## 2023-11-27 DIAGNOSIS — I10 Essential (primary) hypertension: Secondary | ICD-10-CM

## 2023-11-27 DIAGNOSIS — I5032 Chronic diastolic (congestive) heart failure: Secondary | ICD-10-CM | POA: Diagnosis not present

## 2023-11-27 DIAGNOSIS — M19072 Primary osteoarthritis, left ankle and foot: Secondary | ICD-10-CM

## 2023-11-27 DIAGNOSIS — E559 Vitamin D deficiency, unspecified: Secondary | ICD-10-CM

## 2023-11-27 DIAGNOSIS — R7303 Prediabetes: Secondary | ICD-10-CM

## 2023-11-27 DIAGNOSIS — M19071 Primary osteoarthritis, right ankle and foot: Secondary | ICD-10-CM | POA: Diagnosis not present

## 2023-11-27 DIAGNOSIS — M47816 Spondylosis without myelopathy or radiculopathy, lumbar region: Secondary | ICD-10-CM | POA: Diagnosis not present

## 2023-11-27 DIAGNOSIS — Z8659 Personal history of other mental and behavioral disorders: Secondary | ICD-10-CM

## 2023-11-27 DIAGNOSIS — M19042 Primary osteoarthritis, left hand: Secondary | ICD-10-CM

## 2023-11-27 DIAGNOSIS — Z8719 Personal history of other diseases of the digestive system: Secondary | ICD-10-CM

## 2023-11-27 DIAGNOSIS — Z96653 Presence of artificial knee joint, bilateral: Secondary | ICD-10-CM | POA: Diagnosis not present

## 2023-11-27 DIAGNOSIS — Z79899 Other long term (current) drug therapy: Secondary | ICD-10-CM

## 2023-11-27 DIAGNOSIS — Z8669 Personal history of other diseases of the nervous system and sense organs: Secondary | ICD-10-CM

## 2023-11-27 DIAGNOSIS — Z903 Acquired absence of stomach [part of]: Secondary | ICD-10-CM

## 2023-11-27 DIAGNOSIS — M0609 Rheumatoid arthritis without rheumatoid factor, multiple sites: Secondary | ICD-10-CM

## 2023-11-27 DIAGNOSIS — Z8261 Family history of arthritis: Secondary | ICD-10-CM

## 2023-11-27 DIAGNOSIS — E890 Postprocedural hypothyroidism: Secondary | ICD-10-CM

## 2023-11-27 DIAGNOSIS — M5030 Other cervical disc degeneration, unspecified cervical region: Secondary | ICD-10-CM | POA: Diagnosis not present

## 2023-11-27 NOTE — Patient Instructions (Addendum)
 Standing Labs We placed an order today for your standing lab work.   Please have your standing labs drawn in November and every 3 months.  TB Gold with next labs.  Please have your labs drawn 2 weeks prior to your appointment so that the provider can discuss your lab results at your appointment, if possible.  Please note that you may see your imaging and lab results in MyChart before we have reviewed them. We will contact you once all results are reviewed. Please allow our office up to 72 hours to thoroughly review all of the results before contacting the office for clarification of your results.  WALK-IN LAB HOURS  Monday through Thursday from 8:00 am -12:30 pm and 1:00 pm-4:30 pm and Friday from 8:00 am-12:00 pm.  Patients with office visits requiring labs will be seen before walk-in labs.  You may encounter longer than normal wait times. Please allow additional time. Wait times may be shorter on  Monday and Thursday afternoons.  We do not book appointments for walk-in labs. We appreciate your patience and understanding with our staff.   Labs are drawn by [facility]. Please bring your co-pay at the time of your lab draw.  You may receive a bill from [facility] for your lab work.  Please note if you are on Hydroxychloroquine and and an order has been placed for a Hydroxychloroquine level,  you will need to have it drawn 4 hours or more after your last dose.  If you wish to have your labs drawn at another location, please call the office 24 hours in advance so we can fax the orders.  The office is located at [address], [place], [place] [address]   If you have any questions regarding directions or hours of operation,  please call [phone].   As a reminder, please drink plenty of water  prior to coming for your lab work. Thanks!  Vaccines You are taking a medication(s) that can suppress your immune system.  The following immunizations are recommended: Flu annually Covid-19   Td/Tdap (tetanus, diphtheria, pertussis) every 10 years Pneumonia (Prevnar 15 then Pneumovax 23 at least 1 year apart.  Alternatively, can take Prevnar 20 without needing additional dose) Shingrix: 2 doses from 4 weeks to 6 months apart  Please check with your PCP to make sure you are up to date.   If you have signs or symptoms of an infection or start antibiotics: First, call your PCP for workup of your infection. Hold your medication through the infection, until you complete your antibiotics, and until symptoms resolve if you take the following: Injectable medication (Actemra, Benlysta, Cimzia, Cosentyx, Enbrel, Humira, Kevzara, Orencia , Remicade, Simponi, Stelara, Taltz, Tremfya) Methotrexate  Leflunomide (Arava) Mycophenolate (Cellcept) Xeljanz, Olumiant, or Rinvoq    Because you are taking Xeljanz, Rinvoq , or Olumiant, it is very important to know that this class of medications has a FDA BOXED WARNING for major adverse cardiovascular events (MACE), thrombosis, mortality (including sudden cardiovascular death), serious infections, and lymphomas. MACE is defined as cardiovascular death, myocardial infarction, and stroke. Thrombosis includes deep venous thrombosis (DVT), pulmonary embolism (PE), and arterial thrombosis. If you are a current or former smoker, you are at higher risk for MACE.  Please get an annual skin examination to screen for skin cancer while you are on Rinvoq .  Please use sunscreen and sun protection.

## 2023-11-27 NOTE — Progress Notes (Signed)
 Specialty Pharmacy Refill Coordination Note  [name] is a 63 y.o. female contacted today regarding refills of specialty medication(s) Upadacitinib  (Rinvoq )   Patient requested [name] at [facility] at [place] date: 11/27/23   Medication will be filled on 11/27/23.

## 2023-11-29 ENCOUNTER — Encounter: Payer: Self-pay | Admitting: Family Medicine

## 2023-12-18 ENCOUNTER — Other Ambulatory Visit: Payer: Self-pay

## 2023-12-22 ENCOUNTER — Other Ambulatory Visit: Payer: Self-pay

## 2023-12-22 NOTE — Progress Notes (Signed)
 Specialty Pharmacy Refill Coordination Note  [name] is a 63 y.o. female contacted today regarding refills of specialty medication(s) Upadacitinib  (Rinvoq )   Patient requested Delivery   Delivery date: 12/24/23   Verified address: [address] [place] [address]   Medication will be filled on 12/23/23.

## 2023-12-31 ENCOUNTER — Encounter (INDEPENDENT_AMBULATORY_CARE_PROVIDER_SITE_OTHER): Payer: Self-pay | Admitting: Gastroenterology

## 2024-01-05 ENCOUNTER — Emergency Department (HOSPITAL_BASED_OUTPATIENT_CLINIC_OR_DEPARTMENT_OTHER)
Admission: EM | Admit: 2024-01-05 | Discharge: 2024-01-05 | Disposition: A | Discharge status: Home / Self Care | Attending: Emergency Medicine | Admitting: Emergency Medicine

## 2024-01-05 ENCOUNTER — Encounter (HOSPITAL_BASED_OUTPATIENT_CLINIC_OR_DEPARTMENT_OTHER): Payer: Self-pay

## 2024-01-05 ENCOUNTER — Other Ambulatory Visit: Payer: Self-pay

## 2024-01-05 ENCOUNTER — Other Ambulatory Visit (HOSPITAL_BASED_OUTPATIENT_CLINIC_OR_DEPARTMENT_OTHER): Payer: Self-pay

## 2024-01-05 ENCOUNTER — Emergency Department (HOSPITAL_BASED_OUTPATIENT_CLINIC_OR_DEPARTMENT_OTHER): Admitting: Radiology

## 2024-01-05 DIAGNOSIS — Z96653 Presence of artificial knee joint, bilateral: Secondary | ICD-10-CM | POA: Diagnosis not present

## 2024-01-05 DIAGNOSIS — M47814 Spondylosis without myelopathy or radiculopathy, thoracic region: Secondary | ICD-10-CM | POA: Diagnosis not present

## 2024-01-05 DIAGNOSIS — Z91040 Latex allergy status: Secondary | ICD-10-CM | POA: Diagnosis not present

## 2024-01-05 DIAGNOSIS — M19011 Primary osteoarthritis, right shoulder: Secondary | ICD-10-CM | POA: Diagnosis not present

## 2024-01-05 DIAGNOSIS — S8001XA Contusion of right knee, initial encounter: Secondary | ICD-10-CM | POA: Diagnosis not present

## 2024-01-05 DIAGNOSIS — M25519 Pain in unspecified shoulder: Secondary | ICD-10-CM | POA: Diagnosis not present

## 2024-01-05 DIAGNOSIS — M25561 Pain in right knee: Secondary | ICD-10-CM

## 2024-01-05 DIAGNOSIS — M25511 Pain in right shoulder: Secondary | ICD-10-CM | POA: Diagnosis not present

## 2024-01-05 DIAGNOSIS — Z471 Aftercare following joint replacement surgery: Secondary | ICD-10-CM | POA: Diagnosis not present

## 2024-01-05 DIAGNOSIS — S8002XA Contusion of left knee, initial encounter: Secondary | ICD-10-CM | POA: Diagnosis not present

## 2024-01-05 DIAGNOSIS — W010XXA Fall on same level from slipping, tripping and stumbling without subsequent striking against object, initial encounter: Secondary | ICD-10-CM | POA: Diagnosis not present

## 2024-01-05 DIAGNOSIS — M549 Dorsalgia, unspecified: Secondary | ICD-10-CM | POA: Diagnosis not present

## 2024-01-05 DIAGNOSIS — W19XXXA Unspecified fall, initial encounter: Secondary | ICD-10-CM

## 2024-01-05 MED ORDER — IBUPROFEN 600 MG PO TABS
600.0000 mg | ORAL_TABLET | Freq: Three times a day (TID) | ORAL | 0 refills | Status: AC | PRN
  Filled 2024-01-05: qty 30, 10d supply, fill #0

## 2024-01-05 MED ORDER — IBUPROFEN 400 MG PO TABS
600.0000 mg | ORAL_TABLET | Freq: Once | ORAL | Status: AC
  Administered 2024-01-05: 600 mg via ORAL
  Filled 2024-01-05: qty 1

## 2024-01-05 NOTE — Discharge Instructions (Addendum)
 Your x-rays have not been read by radiologist yet.  Please monitor your results on MyChart.  Please follow-up with primary care for discussion of these x-ray results.  I would recommend ibuprofen  every 8 hours as needed for pain control.  Make sure you are eating prior to taking this medicine.  You can also take 1000 mg of Tylenol  every 8 hours as needed.  Use ice and heat over area of pain.  Return to ER with new or worsening symptoms.

## 2024-01-05 NOTE — ED Triage Notes (Signed)
 Arrives ambulatory to the ED with complaints of generalized body pain (back and shoulder pain) related to having a mechanical fall one week ago. Patient states that she tripped over some shoes in her home, falling face forward on wooden floors. No LOC. Rates pain 7/10.

## 2024-01-05 NOTE — ED Provider Notes (Signed)
 [place] EMERGENCY DEPARTMENT AT [facility] Provider Note   CSN: [id number] Arrival date & time: 01/05/24  9074     Patient presents with: Fall and Pain   [name] is a 63 y.o. female patient with past medical history of morbid obesity, chronic back pain, bilateral total knee replacement, GERD is presenting to emergency room with complaint of fall.  Patient reports she had mechanical fall 1 week ago.  She states that she was walking and tripped over a pair of shoes falling forward onto her right arm and she did hit her face.  She had no loss of consciousness.  She is not on blood thinners and not experiencing any headache.  She reports since her fall for approximately 1 week ago she has been having right shoulder pain bilateral knee pain.  She can walk with steady gait.  Has been taking Tylenol  with temporary improvement.  Denies any chest pain shortness of breath or swelling in feet and ankles.    Fall       Prior to Admission medications   Medication Sig Start Date End Date Taking? Authorizing Provider  Calcium  Carb-Cholecalciferol (CALCIUM  + VITAMIN D3 PO) Take by mouth.    [provider]  folic acid  (FOLVITE ) 1 MG tablet Take 2 tablets (2 mg total) by mouth daily. 10/24/23   Antonetta Rollene BRAVO, MD  Multiple Vitamin (MULTIVITAMIN) capsule Take 1 capsule by mouth daily.    [provider]  pantoprazole  (PROTONIX ) 40 MG tablet Take 1 tablet (40 mg total) by mouth daily. 11/18/23   Antonetta Rollene BRAVO, MD  SUMAtriptan  (IMITREX ) 25 MG tablet Take 1 tablet (25 mg total) by mouth every 2 (two) hours as needed for migraine. Take one tablet at headache onset. May repeat once after   2 hours if headache persists .Maximum of 2 tablets in 24 hours 10/24/23   Antonetta Rollene BRAVO, MD  triamterene -hydrochlorothiazide  (MAXZIDE -25) 37.5-25 MG tablet Take 0.5 tablets by mouth daily. 10/24/23   Antonetta Rollene BRAVO, MD  Upadacitinib  ER (RINVOQ ) 15 MG TB24 Take 1  tablet (15 mg total) by mouth daily. 11/17/23   Cheryl Waddell HERO, PA-C    Allergies: Aleve  [naproxen  sodium], B12 folate [cobalamin combinations], Penicillins, Adhesive [tape], and Latex    Review of Systems  Musculoskeletal:  Positive for arthralgias.    Updated Vital Signs BP (!) 136/56 (BP Location: Right Arm)   Pulse (!) 51   Temp 98.3 F (36.8 C) (Oral)   Resp 16   Ht 5' 1 (1.549 m)   Wt 127.5 kg   SpO2 100%   BMI 53.09 kg/m   Physical Exam Vitals and nursing note reviewed.  Constitutional:      General: She is not in acute distress.    Appearance: She is obese. She is not toxic-appearing.  HENT:     Head: Normocephalic and atraumatic.     Comments: Head appears atraumatic.  No sign of skull fracture or hematoma. Eyes:     General: No scleral icterus.    Conjunctiva/sclera: Conjunctivae normal.  Neck:     Comments: No midline tenderness step-off or deformity.  No radicular symptoms into hands.  Normal range of motion of neck. Cardiovascular:     Rate and Rhythm: Normal rate and regular rhythm.     Pulses: Normal pulses.     Heart sounds: Normal heart sounds.  Pulmonary:     Effort: Pulmonary effort is normal. No respiratory distress.     Breath sounds: Normal  breath sounds.  Abdominal:     General: Abdomen is flat. Bowel sounds are normal.     Palpations: Abdomen is soft.     Tenderness: There is no abdominal tenderness.  Musculoskeletal:        General: No tenderness.       Arms:     Right lower leg: No edema.     Left lower leg: No edema.     Comments: Bruising over bilateral knees. Tenderness over ecchymosis. NV intact.  Right shoulder TTP, mild, no swelling or deformity. NV intact.  Skin:    General: Skin is warm and dry.     Findings: No lesion.  Neurological:     General: No focal deficit present.     Mental Status: She is alert and oriented to person, place, and time. Mental status is at baseline.     Comments: Nonfocal neurological exam.      (all labs ordered are listed, but only abnormal results are displayed) Labs Reviewed - No data to display  EKG: None  Radiology: No results found.   Procedures   Medications Ordered in the ED  ibuprofen  (ADVIL ) tablet 600 mg (600 mg Oral Given 01/05/24 1257)    Clinical Course as of 01/05/24 1427  Mon Jan 05, 2024  1408 Patient wants to leave. Has not gotten imaging reads back from radiology. She understands this and still wants to go. She will check MyChart for results. She understands she need PCP follow up and imaging results.  [JB]    Clinical Course User Index [JB] Bronc Brosseau, Warren SAILOR, PA-C                                 Medical Decision Making Amount and/or Complexity of Data Reviewed Radiology: ordered.  Risk Prescription drug management.   This patient presents to the ED for concern of fall, this involves an extensive number of treatment options, and is a complaint that carries with it a high risk of complications and morbidity.  The differential diagnosis includes intracranial hemorrhage, syncope, electrolyte abnormality, dehydration, sprain   Imaging Studies ordered:  I ordered imaging studies including right shoulder x-ray, chest x-ray, right knee x-ray, left knee x-ray I independently visualized and interpreted imaging did not appear to show any obvious acute fracture. Patient left prior to read.  I did discuss that radiologist has not read imaging and patient left prior to radiologist reading but will check on MyChart.   Problem List / ED Course / Critical interventions / Medication management  Presents after mechanical fall 1 week ago.  She did hit her face but she had no loss of consciousness and she denies any head or neck pain at this time.  Given duration of symptoms and Canadian head CT and Canadian cervical spine negative will not obtain imaging of head and neck at this time.  Patient does note right shoulder pain and bilateral knee pain.  I did  obtain x-rays.  She is neurovascularly intact. I ordered medication including Profen for pain control Reevaluation of the patient after these medicines showed that the patient improved I have reviewed the patients home medicines and have made adjustments as needed. Patient hemodynamically stable and well-appearing overall reassuring exam.  Stable for discharge with outpatient follow-up.        Final diagnoses:  Fall, initial encounter  Acute pain of right shoulder  Acute pain of both knees  ED Discharge Orders          Ordered    ibuprofen  (ADVIL ) 600 MG tablet  Every 8 hours PRN        01/05/24 1410               Maggy Wyble, Warren SAILOR, PA-C 01/05/24 1430    Jerrol Agent, MD 01/06/24 [phone]

## 2024-01-07 ENCOUNTER — Ambulatory Visit: Payer: Self-pay | Admitting: Internal Medicine

## 2024-01-08 ENCOUNTER — Other Ambulatory Visit (HOSPITAL_COMMUNITY): Payer: Self-pay

## 2024-01-15 ENCOUNTER — Encounter: Payer: Self-pay | Admitting: Family Medicine

## 2024-01-15 ENCOUNTER — Other Ambulatory Visit (HOSPITAL_COMMUNITY): Payer: Self-pay

## 2024-01-19 ENCOUNTER — Other Ambulatory Visit: Payer: Self-pay

## 2024-01-21 ENCOUNTER — Other Ambulatory Visit: Payer: Self-pay

## 2024-01-21 NOTE — Progress Notes (Signed)
 Specialty Pharmacy Refill Coordination Note  [name] is a 63 y.o. female contacted today regarding refills of specialty medication(s) Upadacitinib  (Rinvoq )   Patient requested Delivery   Delivery date: 01/23/24   Verified address: [address] [place] [address]   Medication will be filled on: 01/22/24

## 2024-02-13 ENCOUNTER — Other Ambulatory Visit: Payer: Self-pay | Admitting: Physician Assistant

## 2024-02-13 ENCOUNTER — Other Ambulatory Visit (HOSPITAL_COMMUNITY): Payer: Self-pay

## 2024-02-13 DIAGNOSIS — M0609 Rheumatoid arthritis without rheumatoid factor, multiple sites: Secondary | ICD-10-CM

## 2024-02-16 ENCOUNTER — Other Ambulatory Visit: Payer: Self-pay

## 2024-02-16 ENCOUNTER — Other Ambulatory Visit (HOSPITAL_COMMUNITY): Payer: Self-pay

## 2024-02-16 MED ORDER — RINVOQ 15 MG PO TB24
15.0000 mg | ORAL_TABLET | Freq: Every day | ORAL | 0 refills | Status: DC
  Filled 2024-02-16 – 2024-02-17 (×2): qty 30, 30d supply, fill #0

## 2024-02-16 NOTE — Telephone Encounter (Signed)
 Last Fill: 11/17/2023  Labs: 10/17/2023 Hemoglobin is low and stable. CMP is normal.   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 Patient states she will come to update them Wednesday.   Okay to refill Rinvoq ?

## 2024-02-17 ENCOUNTER — Other Ambulatory Visit: Payer: Self-pay

## 2024-02-17 ENCOUNTER — Other Ambulatory Visit (HOSPITAL_COMMUNITY): Payer: Self-pay

## 2024-02-17 ENCOUNTER — Other Ambulatory Visit: Payer: Self-pay | Admitting: Family Medicine

## 2024-02-17 NOTE — Progress Notes (Signed)
 Specialty Pharmacy Refill Coordination Note  Oral [name] is a 63 y.o. female contacted today regarding refills of specialty medication(s) Upadacitinib  (Rinvoq )   Patient requested Delivery   Delivery date: 02/25/24   Verified address: [address] [name] [address]   Medication will be filled on: 02/24/24

## 2024-02-18 ENCOUNTER — Other Ambulatory Visit: Payer: Self-pay | Admitting: *Deleted

## 2024-02-18 DIAGNOSIS — Z79899 Other long term (current) drug therapy: Secondary | ICD-10-CM

## 2024-02-18 LAB — COMPLETE METABOLIC PANEL WITHOUT GFR
AG Ratio: 1.2 (calc) (ref 1.0–2.5)
ALT: 12 U/L (ref 6–29)
AST: 19 U/L (ref 10–35)
Albumin: 4.1 g/dL (ref 3.6–5.1)
Alkaline phosphatase (APISO): 92 U/L (ref 37–153)
BUN: 18 mg/dL (ref 7–25)
CO2: 29 mmol/L (ref 20–32)
Calcium: 9.6 mg/dL (ref 8.6–10.4)
Chloride: 102 mmol/L (ref 98–110)
Creat: 0.98 mg/dL (ref 0.50–1.05)
Globulin: 3.5 g/dL (ref 1.9–3.7)
Glucose, Bld: 88 mg/dL (ref 65–99)
Potassium: 4.8 mmol/L (ref 3.5–5.3)
Sodium: 139 mmol/L (ref 135–146)
Total Bilirubin: 0.6 mg/dL (ref 0.2–1.2)
Total Protein: 7.6 g/dL (ref 6.1–8.1)

## 2024-02-18 LAB — CBC WITH DIFFERENTIAL/PLATELET
Absolute Lymphocytes: 2618 {cells}/uL (ref 850–3900)
Absolute Monocytes: 413 {cells}/uL (ref 200–950)
Basophils Absolute: 42 {cells}/uL (ref 0–200)
Basophils Relative: 0.6 %
Eosinophils Absolute: 91 {cells}/uL (ref 15–500)
Eosinophils Relative: 1.3 %
HCT: 37.7 % (ref 35.9–46.0)
Hemoglobin: 11.4 g/dL — ABNORMAL LOW (ref 11.7–15.5)
MCH: 24.6 pg — ABNORMAL LOW (ref 27.0–33.0)
MCHC: 30.2 g/dL — ABNORMAL LOW (ref 31.6–35.4)
MCV: 81.4 fL (ref 81.4–101.7)
MPV: 11 fL (ref 7.5–12.5)
Monocytes Relative: 5.9 %
Neutro Abs: 3836 {cells}/uL (ref 1500–7800)
Neutrophils Relative %: 54.8 %
Platelets: 391 Thousand/uL (ref 140–400)
RBC: 4.63 Million/uL (ref 3.80–5.10)
RDW: 13.2 % (ref 11.0–15.0)
Total Lymphocyte: 37.4 %
WBC: 7 Thousand/uL (ref 3.8–10.8)

## 2024-02-19 NOTE — Progress Notes (Signed)
 Hemoglobin remains low but has improved.   CMP WNL

## 2024-02-24 ENCOUNTER — Other Ambulatory Visit: Payer: Self-pay

## 2024-03-02 ENCOUNTER — Ambulatory Visit: Payer: Self-pay | Admitting: Family Medicine

## 2024-03-16 ENCOUNTER — Other Ambulatory Visit: Payer: Self-pay | Admitting: Physician Assistant

## 2024-03-16 ENCOUNTER — Other Ambulatory Visit (HOSPITAL_COMMUNITY): Payer: Self-pay

## 2024-03-16 ENCOUNTER — Other Ambulatory Visit: Payer: Self-pay

## 2024-03-16 DIAGNOSIS — M0609 Rheumatoid arthritis without rheumatoid factor, multiple sites: Secondary | ICD-10-CM

## 2024-03-16 MED ORDER — RINVOQ 15 MG PO TB24
15.0000 mg | ORAL_TABLET | Freq: Every day | ORAL | 0 refills | Status: AC
  Filled 2024-03-16 – 2024-03-17 (×2): qty 90, 90d supply, fill #0
  Filled 2024-03-22: qty 30, 30d supply, fill #0
  Filled 2024-04-12: qty 90, 90d supply, fill #0

## 2024-03-16 NOTE — Telephone Encounter (Signed)
 Last Fill: 02/16/2024  Labs: 02/18/2024 Hemoglobin remains low but has improved.   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

## 2024-03-17 ENCOUNTER — Other Ambulatory Visit: Payer: Self-pay

## 2024-03-22 ENCOUNTER — Other Ambulatory Visit: Payer: Self-pay

## 2024-03-24 ENCOUNTER — Other Ambulatory Visit (HOSPITAL_COMMUNITY): Payer: Self-pay

## 2024-03-29 ENCOUNTER — Other Ambulatory Visit: Payer: Self-pay

## 2024-03-30 ENCOUNTER — Encounter: Payer: Self-pay | Admitting: Internal Medicine

## 2024-03-30 ENCOUNTER — Telehealth: Payer: Self-pay | Admitting: Pharmacist

## 2024-03-30 ENCOUNTER — Ambulatory Visit: Admitting: Internal Medicine

## 2024-03-30 VITALS — BP 122/79 | HR 72 | Ht 61.0 in | Wt 273.6 lb

## 2024-03-30 DIAGNOSIS — L304 Erythema intertrigo: Secondary | ICD-10-CM

## 2024-03-30 DIAGNOSIS — L02214 Cutaneous abscess of groin: Secondary | ICD-10-CM

## 2024-03-30 MED ORDER — SULFAMETHOXAZOLE-TRIMETHOPRIM 800-160 MG PO TABS: 1.0000 | ORAL_TABLET | Freq: Two times a day (BID) | ORAL | 0 refills | Status: AC

## 2024-03-30 MED ORDER — NYSTATIN 100000 UNIT/GM EX POWD: 1.0000 | Freq: Three times a day (TID) | CUTANEOUS | 0 refills | Status: AC

## 2024-03-30 NOTE — Telephone Encounter (Signed)
 Grant for RA expired. Patient would like [employer] PAP for Rinvoq  to be mailed to her. She states that she did not hang onto the LIS denial letter as recommended (unfortunately she may need to obtain this again from [facility] department). Printed pt and provider forms for [employer] a Prior Authorization request to [employer] for RINVOQ  via CoverMyMeds. Will update once we receive a response.  Key: [id number] Sherry Pennant, PharmD, MPH, BCPS, CPP Clinical Pharmacist

## 2024-03-30 NOTE — Telephone Encounter (Signed)
 Per automated response: Authorization already on file for this request. Authorization starting on 12/17/2023 and ending on 03/17/2025.

## 2024-03-30 NOTE — Patient Instructions (Addendum)
 Please start taking Bactrim  as prescribed.  Please apply warm compresses for local relief with inflammation.  Please clean area with warm water  and soap.  Okay to apply Nystatin  powder for antifungal coverage.

## 2024-03-30 NOTE — Progress Notes (Signed)
 "  Acute Office Visit  Subjective:    Patient ID: [name], female    DOB: 02/06/1961, 64 y.o.   MRN: [id number]  Chief Complaint  Patient presents with   Skin Problem    Pt has boil on the inside crease of right leg. Started on 03/26/24    HPI Patient is in today for evaluation of a boil in the right groin area for the last 3 days.  She has noticed a bump, which is warm and painful.  She has a history of recurrent boils in the axillary area.  Reports mild fever, but denies any chills.  Denies any local bleeding or discharge.  Denies dysuria or vaginal discharge currently.  Past Medical History:  Diagnosis Date   Allergy     Anemia    Annual visit for general adult medical examination with abnormal findings 01/02/2021   Anxiety    Sometimes   Chronic back pain    Degenerative joint disease    Left TKA in 07/2009-Dr. Landau   Depression 2001   doesn't take any meds   Essential hypertension 11/26/2011   GERD (gastroesophageal reflux disease)    Headache(784.0)    occasionally   Human bite of forearm 11/30/2019   Joint pain    Joint swelling    Morbid obesity (HCC)    Nocturia    Osteoarthritis of right knee 07/13/2013   Peripheral edema    to right leg;takes Furosemide  occasionally and hasnt had one in a month   Pre-diabetes    Rheumatoid arthritis (HCC)    Syncope    with palpitations; evaluated by [facility] in 2008 with normal echo    Past Surgical History:  Procedure Laterality Date   ABDOMINAL HYSTERECTOMY  1995   no oophorectomy   BIOPSY  11/26/2022   Procedure: BIOPSY;  Surgeon: Eartha Angelia Sieving, MD;  [facility];  Service: Gastroenterology;;   BIOPSY  02/17/2023   Procedure: BIOPSY;  Surgeon: Wilhelmenia Aloha Raddle., MD;  Location: [facility];  Service: Gastroenterology;;   CARPAL TUNNEL RELEASE  1991   Bilateral   CESAREAN SECTION     X2   CHOLECYSTECTOMY     CHOLECYSTECTOMY, LAPAROSCOPIC  2007   Dr.  Mavis   COLONOSCOPY N/A 03/03/2013   Procedure: COLONOSCOPY;  Surgeon: Claudis RAYMOND Rivet, MD;  [facility];  Service: Endoscopy;  Laterality: N/A;  930   COLONOSCOPY WITH PROPOFOL  N/A 11/26/2022   Procedure: COLONOSCOPY WITH PROPOFOL ;  Surgeon: Eartha Angelia Sieving, MD;  [facility];  Service: Gastroenterology;  Laterality: N/A;  7:30 am, asa 3   CRANIECTOMY SUBOCCIPITAL W/ CERVICAL LAMINECTOMY / CHIARI  2007   Repair of Arnold-Chiari malformation   ESOPHAGOGASTRODUODENOSCOPY (EGD) WITH PROPOFOL  N/A 11/26/2022   Procedure: ESOPHAGOGASTRODUODENOSCOPY (EGD) WITH PROPOFOL ;  Surgeon: Eartha Angelia Sieving, MD;  [facility];  Service: Gastroenterology;  Laterality: N/A;   ESOPHAGOGASTRODUODENOSCOPY (EGD) WITH PROPOFOL  N/A 02/17/2023   Procedure: ESOPHAGOGASTRODUODENOSCOPY (EGD) WITH PROPOFOL ;  Surgeon: Wilhelmenia Aloha Raddle., MD;  [facility];  Service: Gastroenterology;  Laterality: N/A;   EUS N/A 02/17/2023   Procedure: UPPER ENDOSCOPIC ULTRASOUND (EUS) RADIAL;  Surgeon: Wilhelmenia Aloha Raddle., MD;  [facility];  Service: Gastroenterology;  Laterality: N/A;   EYE SURGERY     bilateral cataract surgery   GIVENS CAPSULE STUDY N/A 12/13/2022   Procedure: GIVENS CAPSULE STUDY;  Surgeon: Eartha Angelia Sieving, MD;  [facility];  Service: Gastroenterology;  Laterality: N/A;  7:30am   JOINT REPLACEMENT Left 2011   KNEE ARTHROSCOPY  1995   Dr. Anderson    THYROIDECTOMY, PARTIAL  2004   Left lobectomy - large benign nodule   TONSILLECTOMY     TOTAL KNEE ARTHROPLASTY  07/2009   Left knee , Dr. Josefina    TOTAL KNEE ARTHROPLASTY Right 07/13/2013   Procedure: TOTAL KNEE ARTHROPLASTY;  Surgeon: Fonda SHAUNNA Josefina, MD;  [facility];  Service: Orthopedics;  Laterality: Right;   TUBAL LIGATION     UPPER GI ENDOSCOPY N/A 05/13/2023   Procedure: UPPER GI ENDOSCOPY;  Surgeon: Lyndel Deward PARAS, MD;  Location: [facility];  Service:  General;  Laterality: N/A;    Family History  Problem Relation Age of Onset   Diabetes Mother    Depression Mother    Diabetes Father        emphsema    Hyperlipidemia Father    Emphysema Father    Rheum arthritis Sister    Breast cancer Sister    Cancer Sister    Healthy Daughter    Healthy Daughter    Healthy Daughter    Colon cancer Neg Hx     Social History   Socioeconomic History   Marital status: Significant Other    Spouse name: Not on file   Number of children: 3   Years of education: 15   Highest education level: Some college, no degree  Occupational History   Occupation: Employed seeking disabilty 2012  Tobacco Use   Smoking status: Never    Passive exposure: Never   Smokeless tobacco: Never  Vaping Use   Vaping status: Never Used  Substance and Sexual Activity   Alcohol use: Not Currently    Comment: Occasional   Drug use: No   Sexual activity: Yes    Birth control/protection: Condom, Surgical  Other Topics Concern   Not on file  Social History Narrative   Is in a new relationship    Social Drivers of Health   Tobacco Use: Low Risk (03/30/2024)   Patient History    Smoking Tobacco Use: Never    Smokeless Tobacco Use: Never    Passive Exposure: Never  Financial Resource Strain: High Risk (11/13/2023)   Overall Financial Resource Strain (CARDIA)    Difficulty of Paying Living Expenses: Hard  Food Insecurity: No Food Insecurity (11/13/2023)   Epic    Worried About Programme Researcher, Broadcasting/film/video in the Last Year: Never true    Ran Out of Food in the Last Year: Never true  Recent Concern: Food Insecurity - Food Insecurity Present (08/19/2023)   Hunger Vital Sign    Worried About Running Out of Food in the Last Year: Sometimes true    Ran Out of Food in the Last Year: Never true  Transportation Needs: No Transportation Needs (11/13/2023)   Epic    Lack of Transportation (Medical): No    Lack of Transportation (Non-Medical): No  Physical Activity: Sufficiently  Active (11/13/2023)   Exercise Vital Sign    Days of Exercise per Week: 7 days    Minutes of Exercise per Session: 30 min  Recent Concern: Physical Activity - Insufficiently Active (08/19/2023)   Exercise Vital Sign    Days of Exercise per Week: 4 days    Minutes of Exercise per Session: 20 min  Stress: No Stress Concern Present (11/13/2023)   [employer] of Occupational Health - Occupational Stress Questionnaire    Feeling of Stress: Only a little  Social Connections: Moderately  Integrated (11/13/2023)   Social Connection and Isolation Panel    Frequency of Communication with Friends and Family: More than three times a week    Frequency of Social Gatherings with Friends and Family: Once a week    Attends Religious Services: More than 4 times per year    Active Member of Clubs or Organizations: Yes    Attends Banker Meetings: More than 4 times per year    Marital Status: Divorced  Intimate Partner Violence: Not At Risk (11/13/2023)   Epic    Fear of Current or Ex-Partner: No    Emotionally Abused: No    Physically Abused: No    Sexually Abused: No  Depression (PHQ2-9): Low Risk (03/30/2024)   Depression (PHQ2-9)    PHQ-2 Score: 0  Alcohol Screen: Low Risk (11/13/2023)   Alcohol Screen    Last Alcohol Screening Score (AUDIT): 0  Housing: Low Risk (11/13/2023)   Epic    Unable to Pay for Housing in the Last Year: No    Number of Times Moved in the Last Year: 0    Homeless in the Last Year: No  Utilities: Not At Risk (11/13/2023)   Epic    Threatened with loss of utilities: No  Health Literacy: Adequate Health Literacy (11/13/2023)   B1300 Health Literacy    Frequency of need for help with medical instructions: Never    Outpatient Medications Prior to Visit  Medication Sig Dispense Refill   Calcium  Carb-Cholecalciferol (CALCIUM  + VITAMIN D3 PO) Take by mouth.     folic acid  (FOLVITE ) 1 MG tablet Take 2 tablets (2 mg total) by mouth daily. 180 tablet 3   ibuprofen   (ADVIL ) 600 MG tablet Take 1 tablet (600 mg total) by mouth every 8 (eight) hours as needed for mild pain (pain score 1-3), moderate pain (pain score 4-6) or cramping. 30 tablet 0   Multiple Vitamin (MULTIVITAMIN) capsule Take 1 capsule by mouth daily.     pantoprazole  (PROTONIX ) 40 MG tablet TAKE ONE (1) TABLET BY MOUTH EVERY DAY 90 tablet 1   SUMAtriptan  (IMITREX ) 25 MG tablet Take 1 tablet (25 mg total) by mouth every 2 (two) hours as needed for migraine. Take one tablet at headache onset. May repeat once after   2 hours if headache persists .Maximum of 2 tablets in 24 hours 10 tablet 0   triamterene -hydrochlorothiazide  (MAXZIDE -25) 37.5-25 MG tablet Take 0.5 tablets by mouth daily. 45 tablet 3   Upadacitinib  ER (RINVOQ ) 15 MG TB24 Take 1 tablet (15 mg total) by mouth daily. 90 tablet 0   No facility-administered medications prior to visit.    Allergies[1]  Review of Systems  Constitutional:  Negative for chills and fever.  HENT:  Negative for congestion and sore throat.   Eyes:  Negative for pain and discharge.  Respiratory:  Negative for cough and shortness of breath.   Cardiovascular:  Negative for chest pain and palpitations.  Gastrointestinal:  Negative for abdominal pain, diarrhea, nausea and vomiting.  Endocrine: Negative for polydipsia and polyuria.  Genitourinary:  Negative for dysuria and hematuria.  Musculoskeletal:  Negative for neck pain and neck stiffness.  Skin:  Negative for rash.       Right groin boil  Neurological:  Negative for dizziness and weakness.  Psychiatric/Behavioral:  Negative for agitation and behavioral problems.        Objective:    Physical Exam Vitals reviewed.  Constitutional:      General: She is not in acute distress.  Appearance: She is obese. She is not diaphoretic.  HENT:     Head: Normocephalic and atraumatic.     Nose: Nose normal.     Mouth/Throat:     Mouth: Mucous membranes are moist.  Eyes:     General: No scleral icterus.     Extraocular Movements: Extraocular movements intact.  Cardiovascular:     Rate and Rhythm: Normal rate and regular rhythm.     Heart sounds: Normal heart sounds. No murmur heard. Pulmonary:     Breath sounds: Normal breath sounds. No wheezing or rales.  Musculoskeletal:     Cervical back: Neck supple. No tenderness.     Right lower leg: No edema.     Left lower leg: No edema.  Skin:    General: Skin is warm.     Findings: No rash.     Comments: Right groin abscess without area of fluctuance, about 4 cm in diameter, no active discharge noted  Neurological:     General: No focal deficit present.     Mental Status: She is alert and oriented to person, place, and time.  Psychiatric:        Mood and Affect: Mood normal.        Behavior: Behavior normal.     BP 122/79   Pulse 72   Ht 5' 1 (1.549 m)   Wt 273 lb 9.6 oz (124.1 kg)   SpO2 98%   BMI 51.70 kg/m  Wt Readings from Last 3 Encounters:  03/30/24 273 lb 9.6 oz (124.1 kg)  01/05/24 281 lb (127.5 kg)  11/27/23 283 lb 3.2 oz (128.5 kg)        Assessment & Plan:   Assessment & Plan Groin abscess Started empiric Bactrim  - she is allergic to penicillin Advised to perform warm compresses Keep area clean and dry If drainage noted, advised to clean with warm water  and soap If persistent or worsening, may require I&D Intertrigo Concern for intertrigo in the groin folds Advised to apply nystatin  powder to prevent moisture and for antifungal coverage Keep area clean and dry    No orders of the defined types were placed in this encounter.    Suzzane MARLA Blanch, MD     [1]  Allergies Allergen Reactions   Aleve  [Naproxen  Sodium] Shortness Of Breath   B12 Folate [Cobalamin Combinations] Dermatitis   Penicillins Swelling    .Has patient had a PCN reaction causing immediate rash, facial/tongue/throat swelling, SOB or lightheadedness with hypotension: Yes Has patient had a PCN reaction causing severe rash involving  mucus membranes or skin necrosis: No Has patient had a PCN reaction that required hospitalization: No Has patient had a PCN reaction occurring within the last 10 years: No If all of the above answers are NO, then may proceed with Cephalosporin use.    Adhesive [Tape]     rash   Latex Rash   "

## 2024-03-30 NOTE — Telephone Encounter (Signed)
 Pt's portion of application has been placed in outgoing mail, will await it's return.

## 2024-03-30 NOTE — Progress Notes (Unsigned)
 Called patient regarding Rinvoq . She would like [employer] P[employer] application for Rinvoq  mailed to her.  Advised that [facility] is not able to fill medication unless she wants to pay for it.  Enrolled into CR paused for now  Sherry Pennant, PharmD, MPH, BCPS, CPP Clinical Pharmacist

## 2024-04-01 NOTE — Progress Notes (Signed)
 [name]                                          MRN: [id number]   04/01/2024   The [facility] Quality Team Specialist reviewed this patient medical record for the purposes of chart review for care gap closure. The following were reviewed: chart review for care gap closure-kidney health evaluation for diabetes:eGFR  and uACR.    VBCI Quality Team

## 2024-04-09 LAB — BMP8+EGFR
BUN/Creatinine Ratio: 24 (ref 12–28)
BUN: 24 mg/dL (ref 8–27)
CO2: 23 mmol/L (ref 20–29)
Calcium: 9.6 mg/dL (ref 8.7–10.3)
Chloride: 100 mmol/L (ref 96–106)
Creatinine, Ser: 0.99 mg/dL (ref 0.57–1.00)
Glucose: 80 mg/dL (ref 70–99)
Potassium: 5 mmol/L (ref 3.5–5.2)
Sodium: 138 mmol/L (ref 134–144)
eGFR: 65 mL/min/1.73

## 2024-04-09 LAB — HEMOGLOBIN A1C
Est. average glucose Bld gHb Est-mCnc: 123 mg/dL
Hgb A1c MFr Bld: 5.9 % — ABNORMAL HIGH (ref 4.8–5.6)

## 2024-04-12 ENCOUNTER — Other Ambulatory Visit: Payer: Self-pay

## 2024-04-12 ENCOUNTER — Other Ambulatory Visit (HOSPITAL_COMMUNITY): Payer: Self-pay

## 2024-04-14 ENCOUNTER — Encounter: Admitting: Family Medicine

## 2024-04-14 NOTE — Progress Notes (Unsigned)
 "  Office Visit Note  Patient: [name]             Date of Birth: Apr 17, 1960           MRN: [id number]             PCP: Antonetta Rollene BRAVO, MD Referring: Antonetta Rollene BRAVO, MD Visit Date: 04/28/2024 Occupation: Data Unavailable  Subjective:  No chief complaint on file.   History of Present Illness: [name] is a 64 y.o. female ***     Activities of Daily Living:  Patient reports morning stiffness for *** {minute/hour:19697}.   Patient {ACTIONS;DENIES/REPORTS:21021675::Denies} nocturnal pain.  Difficulty dressing/grooming: {ACTIONS;DENIES/REPORTS:21021675::Denies} Difficulty climbing stairs: {ACTIONS;DENIES/REPORTS:21021675::Denies} Difficulty getting out of chair: {ACTIONS;DENIES/REPORTS:21021675::Denies} Difficulty using hands for taps, buttons, cutlery, and/or writing: {ACTIONS;DENIES/REPORTS:21021675::Denies}  No Rheumatology ROS completed.   PMFS History:  Patient Active Problem List   Diagnosis Date Noted   Encounter for examination following treatment at hospital 10/26/2023   Encounter for immunization 06/03/2023   Gastritis and gastroduodenitis 02/17/2023   Nodule of esophagus 02/17/2023   Anemia 09/18/2022   Sprain, gluteus medius, left, initial encounter 04/22/2022   Seasonal allergies 04/08/2021   Right leg weakness 01/07/2021   Tendinitis of right ankle 10/12/2019   High risk medication use 01/07/2019   Generalized joint pain 10/20/2018   Major depressive disorder, recurrent episode, moderate (HCC) 08/29/2017   Back pain with sciatica 11/13/2015   Knee osteoarthritis 07/13/2013   Snoring 01/03/2013   Thoracic spine pain 12/29/2012   Vitamin D  deficiency 12/08/2012   GERD (gastroesophageal reflux disease) 07/15/2012   Exercise intolerance 11/26/2011   Essential hypertension 11/26/2011   Migraine headache    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 Landau   Depression 2001   doesn't take any meds   Essential hypertension 11/26/2011   GERD (gastroesophageal reflux disease)    Headache(784.0)    occasionally   Human bite of forearm 11/30/2019   Joint pain    Joint swelling    Morbid obesity (HCC)    Nocturia    Osteoarthritis of right knee 07/13/2013   Peripheral edema    to right leg;takes Furosemide  occasionally and hasnt had one in a month   Pre-diabetes    Rheumatoid arthritis (HCC)    Syncope    with palpitations; [facility] in [facility] with normal echo    Family History  Problem Relation Age of Onset   Diabetes Mother    Depression Mother    Diabetes Father        emphsema    Hyperlipidemia Father    Emphysema Father    Rheum arthritis Sister    Breast cancer Sister    Cancer Sister    Healthy Daughter    Healthy Daughter    Healthy Daughter    Colon cancer Neg Hx    Past Surgical History:  Procedure Laterality Date   ABDOMINAL HYSTERECTOMY  1995   no oophorectomy   BIOPSY  11/26/2022   Procedure: BIOPSY;  Surgeon: Eartha Angelia Sieving, MD;  [facility];  Service: Gastroenterology;;   BIOPSY  02/17/2023  Procedure: BIOPSY;  Surgeon: Wilhelmenia Aloha Raddle., MD;  Location: [facility];  Service: Gastroenterology;;   CARPAL TUNNEL RELEASE  1991   Bilateral   CESAREAN SECTION     X2   CHOLECYSTECTOMY     CHOLECYSTECTOMY, LAPAROSCOPIC  2007   Dr. Mavis   COLONOSCOPY N/A 03/03/2013   Procedure: COLONOSCOPY;  Surgeon: Claudis RAYMOND Rivet, MD;  [facility];  Service: Endoscopy;  Laterality: N/A;  930   COLONOSCOPY WITH PROPOFOL  N/A 11/26/2022   Procedure: COLONOSCOPY WITH PROPOFOL ;  Surgeon: Eartha Angelia Sieving, MD;  [facility];  Service: Gastroenterology;  Laterality: N/A;  7:30 am, asa 3   CRANIECTOMY SUBOCCIPITAL W/ CERVICAL LAMINECTOMY / CHIARI  2007   Repair of Arnold-Chiari malformation   ESOPHAGOGASTRODUODENOSCOPY (EGD) WITH PROPOFOL  N/A 11/26/2022   Procedure: ESOPHAGOGASTRODUODENOSCOPY (EGD) WITH PROPOFOL ;  Surgeon: Eartha Angelia Sieving, MD;  [facility];  Service: Gastroenterology;  Laterality: N/A;   ESOPHAGOGASTRODUODENOSCOPY (EGD) WITH PROPOFOL  N/A 02/17/2023   Procedure: ESOPHAGOGASTRODUODENOSCOPY (EGD) WITH PROPOFOL ;  Surgeon: Wilhelmenia Aloha Raddle., MD;  [facility];  Service: Gastroenterology;  Laterality: N/A;   EUS N/A 02/17/2023   Procedure: UPPER ENDOSCOPIC ULTRASOUND (EUS) RADIAL;  Surgeon: Wilhelmenia Aloha Raddle., MD;  [facility];  Service: Gastroenterology;  Laterality: N/A;   EYE SURGERY     bilateral cataract surgery   GIVENS CAPSULE STUDY N/A 12/13/2022   Procedure: GIVENS CAPSULE STUDY;  Surgeon: Eartha Angelia Sieving, MD;  [facility];  Service: Gastroenterology;  Laterality: N/A;  7:30am   JOINT REPLACEMENT Left 2011   KNEE ARTHROSCOPY  1995   Dr. Anderson    THYROIDECTOMY, PARTIAL  2004   Left lobectomy - large benign nodule   TONSILLECTOMY     TOTAL KNEE ARTHROPLASTY  07/2009   Left knee , Dr. Josefina    TOTAL KNEE ARTHROPLASTY Right 07/13/2013   Procedure: TOTAL KNEE ARTHROPLASTY;  Surgeon: Fonda SHAUNNA Josefina, MD;  Location[facility];  Service: Orthopedics;  Laterality: Right;   TUBAL LIGATION     UPPER GI ENDOSCOPY N/A 05/13/2023   Procedure: UPPER GI ENDOSCOPY;  Surgeon: Lyndel Deward PARAS, MD;  [facility];  Service: General;  Laterality: N/A;   Social History[1] Social History   Social History Narrative   Is in a new relationship      Immunization History  Administered Date(s) Administered   Influenza Split 01/16/2012   Influenza Whole 02/23/2007, 12/07/2008   Influenza,  Seasonal, Injecte, Preservative Fre 12/05/2022, 11/18/2023   Influenza,inj,Quad PF,6+ Mos 12/07/2012, 02/15/2014, 11/28/2014, 01/09/2016, 04/07/2017, 12/18/2017, 02/16/2019, 12/09/2019, 01/02/2021, 11/16/2021   Moderna Covid-19 Fall Seasonal Vaccine 57yrs & older 05/06/2022   Moderna Sars-Covid-2 Vaccination 06/09/2019, 07/07/2019, 03/06/2020, 10/10/2020, 05/06/2022   PNEUMOCOCCAL CONJUGATE-20 06/03/2023   Pneumococcal Polysaccharide-23 07/31/2015   Td 10/23/2005   Tdap 11/30/2019   Zoster Recombinant(Shingrix) 02/02/2021, 04/06/2021     Objective: Vital Signs: There were no vitals taken for this visit.   Physical Exam   Musculoskeletal Exam: ***  CDAI Exam: CDAI Score: -- Patient Global: --; Provider Global: -- Swollen: --; Tender: -- Joint Exam 04/28/2024   No joint exam has been documented for this visit   There is currently no information documented on the homunculus. Go to the Rheumatology activity and complete the homunculus joint exam.  Investigation: No additional findings.  Imaging: No results found.  Recent Labs: Lab Results  Component Value Date   WBC 7.0 02/18/2024   HGB 11.4 (L) 02/18/2024   PLT 391  02/18/2024   NA 138 04/08/2024   K 5.0 04/08/2024   CL 100 04/08/2024   CO2 23 04/08/2024   GLUCOSE 80 04/08/2024   BUN 24 04/08/2024   CREATININE 0.99 04/08/2024   BILITOT 0.6 02/18/2024   ALKPHOS 100 08/22/2023   AST 19 02/18/2024   ALT 12 02/18/2024   PROT 7.6 02/18/2024   ALBUMIN 3.8 08/22/2023   CALCIUM  9.6 04/08/2024   GFRAA 85 08/21/2020   QFTBGOLDPLUS NEGATIVE 03/14/2023    Speciality Comments: TB Skin Test 09/17/2023 Negative  Procedures:  No procedures performed Allergies: Aleve  [naproxen  sodium], B12 folate [cobalamin combinations], Penicillins, Adhesive [tape], and Latex   Assessment / Plan:     Visit Diagnoses: Rheumatoid arthritis of multiple sites with negative rheumatoid factor (HCC)  High risk medication use  Primary  osteoarthritis of both hands  Status post total bilateral knee replacement  Primary osteoarthritis of both feet  DDD (degenerative disc disease), cervical  Spondylosis of lumbar spine  Essential hypertension  Chronic diastolic heart failure (HCC)  Hx of migraines  History of gastroesophageal reflux (GERD)  Prediabetes  History of depression  S/P partial thyroidectomy  Vitamin D  deficiency  Family history of rheumatoid arthritis  History of sleeve gastrectomy  Orders: No orders of the defined types were placed in this encounter.  No orders of the defined types were placed in this encounter.   Face-to-face time spent with patient was *** minutes. Greater than 50% of time was spent in counseling and coordination of care.  Follow-Up Instructions: No follow-ups on file.   Waddell CHRISTELLA Craze, PA-C  Note - This record has been created using Dragon software.  Chart creation errors have been sought, but may not always  have been located. Such creation errors do not reflect on  the standard of medical care.     [1]  Social History Tobacco Use   Smoking status: Never    Passive exposure: Never   Smokeless tobacco: Never  Vaping Use   Vaping status: Never Used  Substance Use Topics   Alcohol use: Not Currently    Comment: Occasional   Drug use: No   "

## 2024-04-22 NOTE — Telephone Encounter (Signed)
 Submitted Patient Assistance Application to [employer] for RINVOQ  along with patient portion, provider portion, insurance card copy, prior authorization approval, and current medication list. Will update patient when we receive a response.  Phone: [phone] Fax: [phone]

## 2024-04-28 ENCOUNTER — Ambulatory Visit: Admitting: Physician Assistant

## 2024-04-28 DIAGNOSIS — Z96653 Presence of artificial knee joint, bilateral: Secondary | ICD-10-CM

## 2024-04-28 DIAGNOSIS — E890 Postprocedural hypothyroidism: Secondary | ICD-10-CM

## 2024-04-28 DIAGNOSIS — Z8261 Family history of arthritis: Secondary | ICD-10-CM

## 2024-04-28 DIAGNOSIS — M19041 Primary osteoarthritis, right hand: Secondary | ICD-10-CM

## 2024-04-28 DIAGNOSIS — I10 Essential (primary) hypertension: Secondary | ICD-10-CM

## 2024-04-28 DIAGNOSIS — M0609 Rheumatoid arthritis without rheumatoid factor, multiple sites: Secondary | ICD-10-CM

## 2024-04-28 DIAGNOSIS — I5032 Chronic diastolic (congestive) heart failure: Secondary | ICD-10-CM

## 2024-04-28 DIAGNOSIS — M5030 Other cervical disc degeneration, unspecified cervical region: Secondary | ICD-10-CM

## 2024-04-28 DIAGNOSIS — R7303 Prediabetes: Secondary | ICD-10-CM

## 2024-04-28 DIAGNOSIS — M47816 Spondylosis without myelopathy or radiculopathy, lumbar region: Secondary | ICD-10-CM

## 2024-04-28 DIAGNOSIS — Z8669 Personal history of other diseases of the nervous system and sense organs: Secondary | ICD-10-CM

## 2024-04-28 DIAGNOSIS — Z79899 Other long term (current) drug therapy: Secondary | ICD-10-CM

## 2024-04-28 DIAGNOSIS — Z903 Acquired absence of stomach [part of]: Secondary | ICD-10-CM

## 2024-04-28 DIAGNOSIS — Z8719 Personal history of other diseases of the digestive system: Secondary | ICD-10-CM

## 2024-04-28 DIAGNOSIS — E559 Vitamin D deficiency, unspecified: Secondary | ICD-10-CM

## 2024-04-28 DIAGNOSIS — Z8659 Personal history of other mental and behavioral disorders: Secondary | ICD-10-CM

## 2024-04-28 DIAGNOSIS — M19072 Primary osteoarthritis, left ankle and foot: Secondary | ICD-10-CM

## 2024-06-16 ENCOUNTER — Encounter: Payer: Self-pay | Admitting: Family Medicine

## 2024-11-16 ENCOUNTER — Ambulatory Visit
